# Patient Record
Sex: Male | Born: 1940 | Race: White | Hispanic: No | Marital: Married | Smoking: Former smoker
Health system: Southern US, Community
[De-identification: ages and names within clinical notes are randomized; demographics above are authoritative.]

## PROBLEM LIST (undated history)

## (undated) DIAGNOSIS — J189 Pneumonia, unspecified organism: Secondary | ICD-10-CM

## (undated) DIAGNOSIS — M1990 Unspecified osteoarthritis, unspecified site: Secondary | ICD-10-CM

## (undated) DIAGNOSIS — M545 Low back pain, unspecified: Secondary | ICD-10-CM

## (undated) DIAGNOSIS — I639 Cerebral infarction, unspecified: Secondary | ICD-10-CM

## (undated) DIAGNOSIS — R718 Other abnormality of red blood cells: Secondary | ICD-10-CM

## (undated) DIAGNOSIS — D751 Secondary polycythemia: Secondary | ICD-10-CM

## (undated) DIAGNOSIS — K219 Gastro-esophageal reflux disease without esophagitis: Secondary | ICD-10-CM

## (undated) DIAGNOSIS — U071 COVID-19: Secondary | ICD-10-CM

## (undated) DIAGNOSIS — K635 Polyp of colon: Secondary | ICD-10-CM

## (undated) HISTORY — DX: Gastro-esophageal reflux disease without esophagitis: K21.9

## (undated) HISTORY — DX: Other abnormality of red blood cells: R71.8

## (undated) HISTORY — DX: Secondary polycythemia: D75.1

## (undated) HISTORY — DX: Low back pain, unspecified: M54.50

## (undated) HISTORY — PX: COLONOSCOPY: SHX174

## (undated) HISTORY — PX: POLYPECTOMY: SHX149

## (undated) HISTORY — DX: Polyp of colon: K63.5

## (undated) HISTORY — DX: Unspecified osteoarthritis, unspecified site: M19.90

## (undated) HISTORY — DX: Low back pain: M54.5

## (undated) HISTORY — PX: HERNIA REPAIR: SHX51

## (undated) HISTORY — PX: APPENDECTOMY: SHX54

---

## 2005-05-24 ENCOUNTER — Ambulatory Visit: Payer: Self-pay | Admitting: Internal Medicine

## 2005-06-01 ENCOUNTER — Ambulatory Visit (HOSPITAL_COMMUNITY): Admission: RE | Admit: 2005-06-01 | Discharge: 2005-06-01 | Payer: Self-pay | Admitting: Internal Medicine

## 2005-07-05 ENCOUNTER — Ambulatory Visit: Payer: Self-pay | Admitting: Internal Medicine

## 2005-08-24 ENCOUNTER — Ambulatory Visit: Payer: Self-pay | Admitting: Internal Medicine

## 2007-04-17 ENCOUNTER — Ambulatory Visit: Payer: Self-pay | Admitting: Internal Medicine

## 2007-04-17 DIAGNOSIS — M79609 Pain in unspecified limb: Secondary | ICD-10-CM

## 2007-04-17 DIAGNOSIS — M545 Low back pain: Secondary | ICD-10-CM

## 2007-04-17 DIAGNOSIS — R209 Unspecified disturbances of skin sensation: Secondary | ICD-10-CM | POA: Insufficient documentation

## 2007-04-17 DIAGNOSIS — M1990 Unspecified osteoarthritis, unspecified site: Secondary | ICD-10-CM

## 2007-04-17 LAB — CONVERTED CEMR LAB
BUN: 16 mg/dL (ref 6–23)
Bilirubin Urine: NEGATIVE
CO2: 31 meq/L (ref 19–32)
Calcium: 10 mg/dL (ref 8.4–10.5)
Chloride: 101 meq/L (ref 96–112)
Creatinine, Ser: 1 mg/dL (ref 0.4–1.5)
GFR calc Af Amer: 96 mL/min
GFR calc non Af Amer: 80 mL/min
Glucose, Bld: 87 mg/dL (ref 70–99)
Hemoglobin, Urine: NEGATIVE
Ketones, ur: NEGATIVE mg/dL
Leukocytes, UA: NEGATIVE
Nitrite: NEGATIVE
Potassium: 4.8 meq/L (ref 3.5–5.1)
Sodium: 139 meq/L (ref 135–145)
Specific Gravity, Urine: 1.015 (ref 1.000–1.03)
TSH: 0.9 microintl units/mL (ref 0.35–5.50)
Total Protein, Urine: NEGATIVE mg/dL
Urine Glucose: NEGATIVE mg/dL
Urobilinogen, UA: 0.2 (ref 0.0–1.0)
Vitamin B-12: 358 pg/mL (ref 211–911)
pH: 6 (ref 5.0–8.0)

## 2007-04-20 LAB — CONVERTED CEMR LAB: Vit D, 1,25-Dihydroxy: 49 (ref 30–89)

## 2007-06-26 ENCOUNTER — Ambulatory Visit: Payer: Self-pay | Admitting: Internal Medicine

## 2007-06-27 ENCOUNTER — Ambulatory Visit: Payer: Self-pay | Admitting: Internal Medicine

## 2007-06-27 LAB — CONVERTED CEMR LAB
Calcium: 9.7 mg/dL (ref 8.4–10.5)
GFR calc Af Amer: 86 mL/min
GFR calc non Af Amer: 71 mL/min
Glucose, Bld: 132 mg/dL — ABNORMAL HIGH (ref 70–99)
Potassium: 4.2 meq/L (ref 3.5–5.1)
Sodium: 139 meq/L (ref 135–145)

## 2007-07-04 ENCOUNTER — Encounter: Admission: RE | Admit: 2007-07-04 | Discharge: 2007-07-04 | Payer: Self-pay | Admitting: Internal Medicine

## 2007-07-31 ENCOUNTER — Ambulatory Visit: Payer: Self-pay | Admitting: Internal Medicine

## 2009-03-07 DIAGNOSIS — R718 Other abnormality of red blood cells: Secondary | ICD-10-CM

## 2009-03-07 DIAGNOSIS — D751 Secondary polycythemia: Secondary | ICD-10-CM

## 2009-03-07 HISTORY — DX: Secondary polycythemia: D75.1

## 2009-03-07 HISTORY — DX: Other abnormality of red blood cells: R71.8

## 2009-07-01 ENCOUNTER — Ambulatory Visit: Payer: Self-pay | Admitting: Internal Medicine

## 2009-07-01 DIAGNOSIS — D751 Secondary polycythemia: Secondary | ICD-10-CM

## 2009-07-06 LAB — CONVERTED CEMR LAB
ALT: 30 units/L (ref 0–53)
AST: 32 units/L (ref 0–37)
BUN: 13 mg/dL (ref 6–23)
Basophils Relative: 0.7 % (ref 0.0–3.0)
Bilirubin, Direct: 0.1 mg/dL (ref 0.0–0.3)
Chloride: 102 meq/L (ref 96–112)
Direct LDL: 137.8 mg/dL
Eosinophils Relative: 5.1 % — ABNORMAL HIGH (ref 0.0–5.0)
GFR calc non Af Amer: 79.16 mL/min (ref >60)
HCT: 52 % (ref 39.0–52.0)
Hemoglobin, Urine: NEGATIVE
Leukocytes, UA: NEGATIVE
Lymphs Abs: 3.7 10*3/uL (ref 0.7–4.0)
Monocytes Relative: 10.8 % (ref 3.0–12.0)
Nitrite: NEGATIVE
Platelets: 228 10*3/uL (ref 150.0–400.0)
Potassium: 4.8 meq/L (ref 3.5–5.1)
RBC: 5.14 M/uL (ref 4.22–5.81)
Sodium: 140 meq/L (ref 135–145)
TSH: 0.87 microintl units/mL (ref 0.35–5.50)
Total Bilirubin: 1.1 mg/dL (ref 0.3–1.2)
Total CHOL/HDL Ratio: 6
Total Protein: 7.3 g/dL (ref 6.0–8.3)
Urobilinogen, UA: 1 (ref 0.0–1.0)
WBC: 9.9 10*3/uL (ref 4.5–10.5)

## 2009-07-30 ENCOUNTER — Ambulatory Visit: Payer: Self-pay | Admitting: Internal Medicine

## 2009-08-04 LAB — CONVERTED CEMR LAB
Basophils Absolute: 0 10*3/uL (ref 0.0–0.1)
Lymphocytes Relative: 38.4 % (ref 12.0–46.0)
Lymphs Abs: 3 10*3/uL (ref 0.7–4.0)
Monocytes Relative: 9.7 % (ref 3.0–12.0)
Neutrophils Relative %: 47 % (ref 43.0–77.0)
Platelets: 205 10*3/uL (ref 150.0–400.0)
RDW: 13.5 % (ref 11.5–14.6)

## 2009-08-11 ENCOUNTER — Ambulatory Visit: Payer: Self-pay | Admitting: Internal Medicine

## 2009-08-11 DIAGNOSIS — R059 Cough, unspecified: Secondary | ICD-10-CM | POA: Insufficient documentation

## 2009-08-11 DIAGNOSIS — R05 Cough: Secondary | ICD-10-CM | POA: Insufficient documentation

## 2009-11-30 ENCOUNTER — Ambulatory Visit: Payer: Self-pay | Admitting: Internal Medicine

## 2009-11-30 LAB — CONVERTED CEMR LAB
Albumin ELP: 61.3 % (ref 55.8–66.1)
Alpha-1-Globulin: 3.1 % (ref 2.9–4.9)
Alpha-2-Globulin: 10.9 % (ref 7.1–11.8)
Beta Globulin: 5.4 % (ref 4.7–7.2)
Total Protein, Serum Electrophoresis: 7.2 g/dL (ref 6.0–8.3)

## 2009-12-01 LAB — CONVERTED CEMR LAB
Basophils Relative: 0.6 % (ref 0.0–3.0)
CO2: 29 meq/L (ref 19–32)
Calcium: 9.2 mg/dL (ref 8.4–10.5)
Chloride: 102 meq/L (ref 96–112)
Eosinophils Absolute: 0.3 10*3/uL (ref 0.0–0.7)
Hemoglobin: 18 g/dL — ABNORMAL HIGH (ref 13.0–17.0)
Lymphocytes Relative: 36.9 % (ref 12.0–46.0)
MCHC: 35.1 g/dL (ref 30.0–36.0)
Neutro Abs: 4.8 10*3/uL (ref 1.4–7.7)
RBC: 4.99 M/uL (ref 4.22–5.81)
Sodium: 140 meq/L (ref 135–145)

## 2009-12-03 ENCOUNTER — Ambulatory Visit: Payer: Self-pay | Admitting: Internal Medicine

## 2010-03-02 ENCOUNTER — Ambulatory Visit: Payer: Self-pay | Admitting: Internal Medicine

## 2010-03-07 DIAGNOSIS — K219 Gastro-esophageal reflux disease without esophagitis: Secondary | ICD-10-CM

## 2010-03-07 HISTORY — DX: Gastro-esophageal reflux disease without esophagitis: K21.9

## 2010-03-10 ENCOUNTER — Other Ambulatory Visit: Payer: Self-pay | Admitting: Internal Medicine

## 2010-03-10 LAB — CBC WITH DIFFERENTIAL/PLATELET
Basophils Absolute: 0.1 10*3/uL (ref 0.0–0.1)
Basophils Relative: 0.7 % (ref 0.0–3.0)
Eosinophils Absolute: 0.4 10*3/uL (ref 0.0–0.7)
Eosinophils Relative: 4 % (ref 0.0–5.0)
HCT: 48.4 % (ref 39.0–52.0)
Hemoglobin: 17 g/dL (ref 13.0–17.0)
Lymphocytes Relative: 38.9 % (ref 12.0–46.0)
Lymphs Abs: 3.9 10*3/uL (ref 0.7–4.0)
MCHC: 35.1 g/dL (ref 30.0–36.0)
MCV: 101.1 fl — ABNORMAL HIGH (ref 78.0–100.0)
Monocytes Absolute: 1.1 10*3/uL — ABNORMAL HIGH (ref 0.1–1.0)
Monocytes Relative: 11.1 % (ref 3.0–12.0)
Neutro Abs: 4.5 10*3/uL (ref 1.4–7.7)
Neutrophils Relative %: 45.3 % (ref 43.0–77.0)
Platelets: 238 10*3/uL (ref 150.0–400.0)
RBC: 4.79 Mil/uL (ref 4.22–5.81)
RDW: 13 % (ref 11.5–14.6)
WBC: 9.9 10*3/uL (ref 4.5–10.5)

## 2010-03-10 LAB — BASIC METABOLIC PANEL
BUN: 17 mg/dL (ref 6–23)
CO2: 28 mEq/L (ref 19–32)
Calcium: 9.3 mg/dL (ref 8.4–10.5)
Chloride: 101 mEq/L (ref 96–112)
Creatinine, Ser: 1.1 mg/dL (ref 0.4–1.5)
GFR: 69.31 mL/min (ref >60.00)
Glucose, Bld: 109 mg/dL — ABNORMAL HIGH (ref 70–99)
Potassium: 4.2 mEq/L (ref 3.5–5.1)
Sodium: 137 mEq/L (ref 135–145)

## 2010-03-16 ENCOUNTER — Other Ambulatory Visit: Payer: Self-pay | Admitting: Internal Medicine

## 2010-03-16 ENCOUNTER — Ambulatory Visit
Admission: RE | Admit: 2010-03-16 | Discharge: 2010-03-16 | Payer: Self-pay | Source: Home / Self Care | Attending: Internal Medicine | Admitting: Internal Medicine

## 2010-03-16 DIAGNOSIS — K219 Gastro-esophageal reflux disease without esophagitis: Secondary | ICD-10-CM | POA: Insufficient documentation

## 2010-03-16 LAB — CBC WITH DIFFERENTIAL/PLATELET
Basophils Absolute: 0.1 10*3/uL (ref 0.0–0.1)
Basophils Relative: 0.6 % (ref 0.0–3.0)
Eosinophils Absolute: 0.5 10*3/uL (ref 0.0–0.7)
Eosinophils Relative: 5.9 % — ABNORMAL HIGH (ref 0.0–5.0)
HCT: 48 % (ref 39.0–52.0)
Hemoglobin: 16.5 g/dL (ref 13.0–17.0)
Lymphocytes Relative: 36.6 % (ref 12.0–46.0)
Lymphs Abs: 3.2 10*3/uL (ref 0.7–4.0)
MCHC: 34.3 g/dL (ref 30.0–36.0)
MCV: 101.6 fl — ABNORMAL HIGH (ref 78.0–100.0)
Monocytes Absolute: 0.9 10*3/uL (ref 0.1–1.0)
Monocytes Relative: 9.8 % (ref 3.0–12.0)
Neutro Abs: 4.1 10*3/uL (ref 1.4–7.7)
Neutrophils Relative %: 47.1 % (ref 43.0–77.0)
Platelets: 236 10*3/uL (ref 150.0–400.0)
RBC: 4.72 Mil/uL (ref 4.22–5.81)
RDW: 13.4 % (ref 11.5–14.6)
WBC: 8.8 10*3/uL (ref 4.5–10.5)

## 2010-03-16 LAB — FERRITIN: Ferritin: 23.6 ng/mL (ref 22.0–322.0)

## 2010-03-22 ENCOUNTER — Telehealth: Payer: Self-pay | Admitting: Internal Medicine

## 2010-03-26 ENCOUNTER — Ambulatory Visit
Admission: RE | Admit: 2010-03-26 | Discharge: 2010-03-26 | Payer: Self-pay | Source: Home / Self Care | Attending: Internal Medicine | Admitting: Internal Medicine

## 2010-03-26 ENCOUNTER — Other Ambulatory Visit: Payer: Self-pay | Admitting: Internal Medicine

## 2010-03-26 LAB — URINALYSIS
Bilirubin Urine: NEGATIVE
Hemoglobin, Urine: NEGATIVE
Ketones, ur: NEGATIVE
Leukocytes, UA: NEGATIVE
Nitrite: NEGATIVE
Specific Gravity, Urine: 1.015 (ref 1.000–1.030)
Total Protein, Urine: NEGATIVE
Urine Glucose: NEGATIVE
Urobilinogen, UA: 0.2 (ref 0.0–1.0)
pH: 6 (ref 5.0–8.0)

## 2010-03-31 ENCOUNTER — Encounter
Admission: RE | Admit: 2010-03-31 | Discharge: 2010-03-31 | Payer: Self-pay | Source: Home / Self Care | Attending: Internal Medicine | Admitting: Internal Medicine

## 2010-04-06 NOTE — Assessment & Plan Note (Signed)
Summary: 1 MO FU/DISCUSS LABS/KB   Vital Signs:  Patient profile:   70 year old male Height:      68 inches Weight:      196.38 pounds BMI:     29.97 O2 Sat:      93 % on Room air Temp:     98.2 degrees F oral Pulse rate:   70 / minute BP sitting:   134 / 74  (left arm) Cuff size:   large  Vitals Entered By: Lucious Groves (August 11, 2009 8:19 AM)  O2 Flow:  Room air CC: 1 mo f/u./kb Is Patient Diabetic? No Pain Assessment Patient in pain? no        CC:  1 mo f/u./kb.  History of Present Illness: F/u abn CBC C/o occasional cough, leg numpness  Current Medications (verified): 1)  Vitamin D 1000 Unit Tabs (Cholecalciferol) .[name].. 1 By Mouth Qd 2)  Ibuprofen 600 Mg Tabs (Ibuprofen) .[name].. 1 By Mouth Bid  Pc X 1 Wk Then As Needed For  Pain  Allergies (verified): No Known Drug Allergies  Past History:  Social History: Last updated: 04/17/2007 Retired Married Former Smoker  Past Medical History: Low back pain Osteoarthritis Polycythemia 2011 Elev MCV 2011  Physical Exam  General:  Well-developed,well-nourished,in no acute distress; alert,appropriate and cooperative throughout examination Eyes:  No corneal or conjunctival inflammation noted. EOMI. Perrla. Mouth:  Erythematous throat mucosa and intranasal erythema.  Lungs:  Normal respiratory effort, chest expands symmetrically. Lungs are clear to auscultation, no crackles or wheezes. Heart:  Normal rate and regular rhythm. S1 and S2 normal without gallop, murmur, click, rub or other extra sounds. Abdomen:  Bowel sounds positive,abdomen soft and non-tender without masses, organomegaly or hernias noted. Msk:  Lumbar-sacral spine is tender to palpation over paraspinal muscles and painfull with the ROM L hip is not  tender lat R poster knee is a little tender Pulses:  R and L carotid,radial,femoral,dorsalis pedis and posterior tibial pulses are full and equal bilaterally Extremities:  No edema B Neurologic:  No cranial  nerve deficits noted. Station and gait are normal. Plantar reflexes are down-going bilaterally. DTRs are symmetrical throughout. Sensory, motor and coordinative functions appear intact. Skin:  lipomas on abd wall Psych:  Cognition and judgment appear intact. Alert and cooperative with normal attention span and concentration. No apparent delusions, illusions, hallucinations   Impression & Recommendations:  Problem # 1:  POLYCYTHEMIA (ICD-289.0) Assessment Improved ASA Repeat labs in 3 months   Problem # 2:  LEG PAIN (ICD-729.5) R post knee OA Pennsaid  Problem # 3:  COUGH (ICD-786.2) Assessment: New  Orders: T-2 View CXR, Same Day (71020.5TC)  Problem # 4:  PARESTHESIA (ICD-782.0) Assessment: Unchanged  Complete Medication List: 1)  Vitamin D 1000 Unit Tabs (Cholecalciferol) .[name].. 1 by mouth qd 2)  Ibuprofen 600 Mg Tabs (Ibuprofen) .[name].. 1 by mouth bid  pc x 1 wk then as needed for  pain 3)  Pennsaid 1.5 % Soln (Diclofenac sodium) .... 3-5 gtt on skin three times a day for pain 4)  Aspirin 81 Mg Tbec (Aspirin) .[name].. 1 by mouth qd  Patient Instructions: 1)  Please schedule a follow-up appointment in 3 months. 2)  BMP prior to visit, ICD-9: 3)  CBC w/ Diff prior to visit, ICD-9: with Path periph smear Dx 289.0polycythemia 4)  Vit B12  782.0 5)  ESR 6)  SPEP Prescriptions: PENNSAID 1.5 % SOLN (DICLOFENAC SODIUM) 3-5 gtt on skin three times a day for pain  #  1 x 3   Entered and Authorized by:   Tresa Garter MD   Signed by:   Tresa Garter MD on 08/11/2009   Method used:   Print then Give to Patient   RxID:   [id number]

## 2010-04-06 NOTE — Assessment & Plan Note (Signed)
Summary: 3 mo rov /nws  #   Vital Signs:  Patient profile:   70 year old male Height:      68 inches Weight:      200 pounds BMI:     30.52 Temp:     97.1 degrees F oral Pulse rate:   72 / minute Pulse rhythm:   regular Resp:     16 per minute BP sitting:   140 / 80  (left arm) Cuff size:   regular  Vitals Entered By: Lanier Prude, CMA(AAMA) (December 03, 2009 8:21 AM) CC: 3 mo f/u Is Patient Diabetic? No   CC:  3 mo f/u.  History of Present Illness: F/u elev Hgb level  Preventive Screening-Counseling & Management  Alcohol-Tobacco     Smoking Status: quit  Current Medications (verified): 1)  Vitamin D 1000 Unit Tabs (Cholecalciferol) .[name].. 1 By Mouth Qd 2)  Ibuprofen 600 Mg Tabs (Ibuprofen) .[name].. 1 By Mouth Bid  Pc X 1 Wk Then As Needed For  Pain 3)  Pennsaid 1.5 % Soln (Diclofenac Sodium) .... 3-5 Gtt On Skin Three Times A Day For Pain 4)  Aspirin 81 Mg Tbec (Aspirin) .[name].. 1 By Mouth Qd  Allergies (verified): No Known Drug Allergies  Past History:  Past Medical History: Last updated: 08/11/2009 Low back pain Osteoarthritis Polycythemia 2011 Elev MCV 2011  Social History: Last updated: 04/17/2007 Retired Married Former Smoker  Review of Systems  The patient denies fever, chest pain, syncope, dyspnea on exertion, peripheral edema, prolonged cough, hemoptysis, abdominal pain, and headaches.    Physical Exam  General:  Well-developed,well-nourished,in no acute distress; alert,appropriate and cooperative throughout examination Eyes:  No corneal or conjunctival inflammation noted. EOMI. Perrla. Nose:  External nasal examination shows no deformity or inflammation. Nasal mucosa are pink and moist without lesions or exudates. Mouth:  Erythematous throat mucosa and intranasal erythema.  Lungs:  Normal respiratory effort, chest expands symmetrically. Lungs are clear to auscultation, no crackles or wheezes. Heart:  Normal rate and regular rhythm. S1 and S2  normal without gallop, murmur, click, rub or other extra sounds. Abdomen:  Bowel sounds positive,abdomen soft and non-tender without masses, organomegaly or hernias noted. Msk:  Lumbar-sacral spine is tender to palpation over paraspinal muscles and painfull with the ROM L hip is not  tender lat  knee is a not tender B Neurologic:  No cranial nerve deficits noted. Station and gait are normal. Plantar reflexes are down-going bilaterally. DTRs are symmetrical throughout. Sensory, motor and coordinative functions appear intact. Skin:  lipomas on abd wall plethoric face Psych:  Cognition and judgment appear intact. Alert and cooperative with normal attention span and concentration. No apparent delusions, illusions, hallucinations   Impression & Recommendations:  Problem # 1:  POLYCYTHEMIA (ICD-289.0) Assessment Deteriorated We discussed the issue The office visit took longer than 20 min with patient councelling for more than 50% of the 20 min   Orders: Phlebotomy,Therapuetic (16109) Hematology Referral (Hematology) Dr Myna Hidalgo - the pt refused Take ASA  Problem # 2:  LEG PAIN (ICD-729.5) - knee, resolved Assessment: Improved Pennsaid prn  Complete Medication List: 1)  Vitamin D 1000 Unit Tabs (Cholecalciferol) .[name].. 1 by mouth qd 2)  Ibuprofen 600 Mg Tabs (Ibuprofen) .[name].. 1 by mouth bid  pc x 1 wk then as needed for  pain 3)  Pennsaid 1.5 % Soln (Diclofenac sodium) .... 3-5 gtt on skin three times a day for pain 4)  Aspirin 81 Mg Tbec (Aspirin) .[name].. 1 by mouth  qd  Patient Instructions: 1)  Please schedule a follow-up appointment in 4 months. 2)  BMP prior to visit, ICD-9: 3)  CBC w/ Diff prior to visit, ICD-9:289.0

## 2010-04-06 NOTE — Assessment & Plan Note (Signed)
Summary: YEARLY FU/ MEDICARE/NWS  #   Vital Signs:  Patient profile:   70 year old male Height:      68 inches Weight:      201.25 pounds BMI:     30.71 O2 Sat:      94 % on Room air Temp:     97.3 degrees F oral Pulse rate:   77 / minute BP sitting:   130 / 82  (left arm)  Vitals Entered By: Lucious Groves (July 01, 2009 8:39 AM)  O2 Flow:  Room air  Procedure Note  Injections: The patient complains of pain and inflammation. Indication: chronic pain Consent signed: yes  Procedure # 1: joint injection    Region: lateral    Location: L hip    Technique: 24 g needle    Medication: 80 mg depomedrol    Anesthesia: 3.0 ml 1% lidocaine w/o epinephrine    Comment: Risks including but not limited by incomplete procedure, bleeding, infection, recurrence were discussed with the patient. Consent form was signed. Tolerated well. Complicatons - none. Good pain relief following the procedure.   Cleaned and prepped with: alcohol and betadine Wound dressing: bandaid   History of Present Illness: The patient presents for a wellness examination  Diet: Heart healthy and ADA  Physical activity - active.   Depression/mood screen: Negative.  Hearing: Intact  bilateral.  Visual Acuity: Grossly normal w/glasses.  ADL's: capable. Fall risk: None.  Home safety: good.  End of LifePlanning/Advanced directive - Full code. I agree. Discussed the need for AD. The patient presents with complaints of sore throat, fever, cough, sinus congestion and drainge of 3 wks duration. Not better with OTC meds. Chest hurts with coughing. Can't sleep due to cough. Muscle aches are present.  The mucus is colored.  C/o L hip pain x 2 years  Current Medications (verified): 1)  Otc Cold Medicine- Guaifensin 400mg  and Dextromethorphan 20 .... As Directed  Allergies (verified): No Known Drug Allergies  Past History:  Past Medical History: Last updated: 04/17/2007 Low back pain Osteoarthritis  Past Surgical  History: Last updated: 04/17/2007 Appendectomy  Family History: Last updated: 04/17/2007 Family History Breast cancer 1st degree relative  No CVA, DM  Social History: Last updated: 04/17/2007 Retired Married Former Smoker  Review of Systems  The patient denies anorexia, fever, weight loss, weight gain, vision loss, decreased hearing, hoarseness, chest pain, syncope, dyspnea on exertion, peripheral edema, prolonged cough, headaches, hemoptysis, abdominal pain, melena, hematochezia, severe indigestion/heartburn, hematuria, incontinence, genital sores, muscle weakness, suspicious skin lesions, transient blindness, difficulty walking, depression, unusual weight change, abnormal bleeding, enlarged lymph nodes, angioedema, and testicular masses.         ST  Physical Exam  General:  Well-developed,well-nourished,in no acute distress; alert,appropriate and cooperative throughout examination Eyes:  No corneal or conjunctival inflammation noted. EOMI. Perrla. Ears:  External ear exam shows no significant lesions or deformities.  Otoscopic examination reveals clear canals, tympanic membranes are intact bilaterally without bulging, retraction, inflammation or discharge. Hearing is grossly normal bilaterally. Mouth:  Erythematous throat mucosa and intranasal erythema.  Neck:  No deformities, masses, or tenderness noted. Chest Wall:  No deformities, masses, tenderness or gynecomastia noted. Lungs:  Normal respiratory effort, chest expands symmetrically. Lungs are clear to auscultation, no crackles or wheezes. Heart:  Normal rate and regular rhythm. S1 and S2 normal without gallop, murmur, click, rub or other extra sounds. Abdomen:  Bowel sounds positive,abdomen soft and non-tender without masses, organomegaly or hernias noted. Rectal:  WNL Genitalia:  Testes bilaterally descended without nodularity, tenderness or masses. No scrotal masses or lesions. No penis lesions or urethral  discharge. Prostate:  1+ enlarged.   Msk:  Lumbar-sacral spine is tender to palpation over paraspinal muscles and painfull with the ROM L hip is tender lat Pulses:  R and L carotid,radial,femoral,dorsalis pedis and posterior tibial pulses are full and equal bilaterally Extremities:  No edema B Neurologic:  No cranial nerve deficits noted. Station and gait are normal. Plantar reflexes are down-going bilaterally. DTRs are symmetrical throughout. Sensory, motor and coordinative functions appear intact. Skin:  lipomas on abd wall Cervical Nodes:  No lymphadenopathy noted Inguinal Nodes:  No significant adenopathy Psych:  Cognition and judgment appear intact. Alert and cooperative with normal attention span and concentration. No apparent delusions, illusions, hallucinations   Impression & Recommendations:  Problem # 1:  PHYSICAL EXAMINATION (ICD-V70.0) Assessment New Health and age related issues were discussed. Available screening tests and vaccinations were discussed as well. Healthy life style including good diet and execise was discussed.  Orders: EKG w/ Interpretation (93000) TLB-BMP (Basic Metabolic Panel-BMET) (80048-METABOL) TLB-CBC Platelet - w/Differential (85025-CBCD) TLB-Hepatic/Liver Function Pnl (80076-HEPATIC) TLB-Lipid Panel (80061-LIPID) TLB-TSH (Thyroid Stimulating Hormone) (84443-TSH) TLB-PSA (Prostate Specific Antigen) (84153-PSA) TLB-Udip ONLY (81003-UDIP)  Problem # 2:  SINUSITIS, ACUTE (ICD-461.9) Assessment: New  His updated medication list for this problem includes:    Zithromax Z-pak 250 Mg Tabs (Azithromycin) .[name]... As dirrected  Problem # 3:  LEG PAIN (ICD-729.5) L troch bursitis Assessment: New  Injection  Orders: Joint Aspirate / Injection, Large (20610) Depo- Medrol 80mg  (J1040)  Problem # 4:  LOW BACK PAIN (ICD-724.2) Assessment: Improved  The following medications were removed from the medication list:    Tramadol Hcl 50 Mg Tabs (Tramadol hcl)  .[name]... 1 or 2 tabs by mouth two times a day as needed pain His updated medication list for this problem includes:    Ibuprofen 600 Mg Tabs (Ibuprofen) .[name]... 1 by mouth bid  pc x 1 wk then as needed for  pain  Problem # 5:  POLYCYTHEMIA (ICD-289.0) Assessment: New RTC 1 month w/CBC  Complete Medication List: 1)  Vitamin D 1000 Unit Tabs (Cholecalciferol) .[name].. 1 by mouth qd 2)  Zithromax Z-pak 250 Mg Tabs (Azithromycin) .... As dirrected 3)  Ibuprofen 600 Mg Tabs (Ibuprofen) .[name].. 1 by mouth bid  pc x 1 wk then as needed for  pain  Other Orders: TD Toxoids IM 7 YR + (16109) Pneumococcal Vaccine (60454) Admin 1st Vaccine (09811) Admin of Any Addtl Vaccine (91478) Admin 1st Vaccine (State) [phone]) Admin of Any Addtl Vaccine (State) (30865H)  Patient Instructions: 1)  Please schedule a follow-up appointment in 1 month w/CBC. Prescriptions: IBUPROFEN 600 MG TABS (IBUPROFEN) 1 by mouth bid  pc x 1 wk then as needed for  pain  #60 x 3   Entered and Authorized by:   Tresa Garter MD   Signed by:   Tresa Garter MD on 07/01/2009   Method used:   Print then Give to Patient   RxID:   [id number] Z-PAK 250 MG TABS (AZITHROMYCIN) as dirrected  #1 x 0   Entered and Authorized by:   Tresa Garter MD   Signed by:   Tresa Garter MD on 07/01/2009   Method used:   Print then Give to Patient   RxID:   [id number]    Tetanus/Td Vaccine    Vaccine Type: Td    Site: right deltoid  Mfr: Sanofi Pasteur    Dose: 0.5 ml    Route: IM    Given by: Lucious Groves    Exp. Date: 03/20/2011    Lot #: [id number]    VIS given: 01/23/07 version given July 01, 2009.  Pneumovax Vaccine    Vaccine Type: Pneumovax    Site: left deltoid    Mfr: Merck    Dose: 0.5 ml    Route: IM    Given by: Lucious Groves    Exp. Date: 10/17/2010    Lot #: [id number]    VIS given: 10/03/95 version given July 01, 2009.

## 2010-04-08 NOTE — Assessment & Plan Note (Signed)
Summary: 3 MO ROV /NWS NWS  #   Vital Signs:  Patient profile:   70 year old male Height:      68 inches Weight:      202 pounds BMI:     30.83 Temp:     98.4 degrees F oral Pulse rate:   76 / minute Pulse rhythm:   regular Resp:     16 per minute BP sitting:   130 / 90  (left arm) Cuff size:   regular  Vitals Entered By: Lanier Prude, CMA(AAMA) (March 16, 2010 7:55 AM) CC: 3 mo f/u  Is Patient Diabetic? No Comments pt is not taking Vit D, Pennsaid or Ibuprofen   CC:  3 mo f/u .  History of Present Illness: C/o abd burning from Ibuprofen - he stopped.  The patient presents for a follow up of OA, LBP, polycythemia  Current Medications (verified): 1)  Vitamin D 1000 Unit Tabs (Cholecalciferol) .[name].. 1 By Mouth Qd 2)  Ibuprofen 600 Mg Tabs (Ibuprofen) .[name].. 1 By Mouth Bid  Pc X 1 Wk Then As Needed For  Pain 3)  Pennsaid 1.5 % Soln (Diclofenac Sodium) .... 3-5 Gtt On Skin Three Times A Day For Pain 4)  Aspirin 81 Mg Tbec (Aspirin) .[name].. 1 By Mouth Qd  Allergies (verified): No Known Drug Allergies  Past History:  Social History: Last updated: 04/17/2007 Retired Married Former Smoker  Past Medical History: Low back pain Osteoarthritis Polycythemia 2011 Elev MCV 2011 GERD 2012  Review of Systems  The patient denies fever, chest pain, dyspnea on exertion, abdominal pain, melena, and depression.         GERD  Physical Exam  General:  Well-developed,well-nourished,in no acute distress; alert,appropriate and cooperative throughout examination Eyes:  No corneal or conjunctival inflammation noted. EOMI. Perrla. Nose:  External nasal examination shows no deformity or inflammation. Nasal mucosa are pink and moist without lesions or exudates. Mouth:  Erythematous throat mucosa and intranasal erythema.  Lungs:  Normal respiratory effort, chest expands symmetrically. Lungs are clear to auscultation, no crackles or wheezes. Heart:  Normal rate and regular rhythm. S1 and  S2 normal without gallop, murmur, click, rub or other extra sounds. Abdomen:  Bowel sounds positive,abdomen soft and non-tender without masses, organomegaly or hernias noted. Msk:  Lumbar-sacral spine is tender to palpation over paraspinal muscles and painfull with the ROM L hip is not  tender lat  knee is a not tender B Extremities:  No edema B Neurologic:  No cranial nerve deficits noted. Station and gait are normal. Plantar reflexes are down-going bilaterally. DTRs are symmetrical throughout. Sensory, motor and coordinative functions appear intact. Skin:  lipomas on abd wall plethoric face Psych:  Cognition and judgment appear intact. Alert and cooperative with normal attention span and concentration. No apparent delusions, illusions, hallucinations   Impression & Recommendations:  Problem # 1:  POLYCYTHEMIA (ICD-289.0) Assessment Unchanged He declined further w/up incl. hematology consult Orders: Phlebotomy,Therapuetic (96295) TLB-CBC Platelet - w/Differential (85025-CBCD) TLB-Ferritin (82728-FER) Cont ASA  Problem # 2:  GERD (ICD-530.81) Assessment: New  D/c Ibuprofen  His updated medication list for this problem includes:    Ranitidine Hcl 300 Mg Tabs (Ranitidine hcl) .[name]... 1 by mouth once daily for indigestion  Problem # 3:  OSTEOARTHRITIS (ICD-715.90) Assessment: Unchanged  His updated medication list for this problem includes:    Ibuprofen 600 Mg Tabs (Ibuprofen) .[name]... 1 by mouth bid  pc x 1 wk then as needed for  pain  Aspirin 81 Mg Tbec (Aspirin) .[name]... 1 by mouth qd  Complete Medication List: 1)  Vitamin D 1000 Unit Tabs (Cholecalciferol) .[name].. 1 by mouth qd 2)  Ibuprofen 600 Mg Tabs (Ibuprofen) .[name].. 1 by mouth bid  pc x 1 wk then as needed for  pain 3)  Pennsaid 1.5 % Soln (Diclofenac sodium) .... 3-5 gtt on skin three times a day for pain 4)  Aspirin 81 Mg Tbec (Aspirin) .[name].. 1 by mouth qd 5)  Ranitidine Hcl 300 Mg Tabs (Ranitidine hcl) .[name].. 1 by mouth once  daily for indigestion  Patient Instructions: 1)  Please schedule a follow-up appointment in 2 months. Prescriptions: RANITIDINE HCL 300 MG TABS (RANITIDINE HCL) 1 by mouth once daily for indigestion  #30 x 3   Entered and Authorized by:   Tresa Garter MD   Signed by:   Tresa Garter MD on 03/16/2010   Method used:   Print then Give to Patient   RxID:   [id number]    Orders Added: 1)  Phlebotomy,Therapuetic [99195] 2)  Est. Patient Level IV [56213] 3)  TLB-CBC Platelet - w/Differential [85025-CBCD] 4)  TLB-Ferritin [08657-QIO]

## 2010-04-08 NOTE — Progress Notes (Signed)
Summary: NEEDS REFERRAL  Phone Note Call from Patient   Summary of Call: Patient is requesting referral to GI for colonscopy. Does not know when his last one was but did have w/[facility]. Please put in referral for colon, thanks Initial call taken by: Lamar Sprinkles, CMA,  March 22, 2010 11:38 AM  Follow-up for Phone Call        ok Follow-up by: Tresa Garter MD,  March 23, 2010 10:10 PM

## 2010-04-08 NOTE — Assessment & Plan Note (Signed)
Summary: abd discomfort/SD   Vital Signs:  Patient profile:   70 year old male Height:      68 inches Weight:      202 pounds BMI:     30.83 Temp:     98.7 degrees F oral Pulse rate:   88 / minute Pulse rhythm:   regular Resp:     16 per minute BP sitting:   150 / 96  (left arm) Cuff size:   regular  Vitals Entered By: Lanier Prude, CMA(AAMA) (March 26, 2010 4:42 PM) CC: Lt side LBP and LLQ Abd pain Is Patient Diabetic? No Comments pt is not taking Ibuprofen or Pennsaid   CC:  Lt side LBP and LLQ Abd pain.  History of Present Illness: C/o nagging pain in LLQ x 1 wk, continious and w/o irrad. No worse after meals. No fever. No N/V.  Current Medications (verified): 1)  Vitamin D 1000 Unit Tabs (Cholecalciferol) .[name].. 1 By Mouth Qd 2)  Ibuprofen 600 Mg Tabs (Ibuprofen) .[name].. 1 By Mouth Bid  Pc X 1 Wk Then As Needed For  Pain 3)  Pennsaid 1.5 % Soln (Diclofenac Sodium) .... 3-5 Gtt On Skin Three Times A Day For Pain 4)  Aspirin 81 Mg Tbec (Aspirin) .[name].. 1 By Mouth Qd 5)  Ranitidine Hcl 300 Mg Tabs (Ranitidine Hcl) .[name].. 1 By Mouth Once Daily For Indigestion  Allergies (verified): No Known Drug Allergies  Past History:  Past Medical History: Last updated: 03/16/2010 Low back pain Osteoarthritis Polycythemia 2011 Elev MCV 2011 GERD 2012  Past Surgical History: Last updated: 04/17/2007 Appendectomy  Social History: Last updated: 04/17/2007 Retired Married Former Smoker  Review of Systems       The patient complains of abdominal pain.  The patient denies anorexia, fever, weight loss, dyspnea on exertion, melena, hematochezia, severe indigestion/heartburn, hematuria, and difficulty walking.    Physical Exam  General:  Well-developed,well-nourished,in no acute distress; alert,appropriate and cooperative throughout examination Mouth:  pharynx pink and moist.   Lungs:  Normal respiratory effort, chest expands symmetrically. Lungs are clear to auscultation, no  crackles or wheezes. Heart:  Normal rate and regular rhythm. S1 and S2 normal without gallop, murmur, click, rub or other extra sounds. Abdomen:  Bowel sounds positive,abdomen soft Sensitive to palpation in LUQ Rectal:  no external abnormalities, no hemorrhoids, and no masses.  G(-) Prostate:  no nodules, no asymmetry, and 1+ enlarged.   Msk:  Lumbar-sacral spine is not  tender to palpation over paraspinal muscles    Impression & Recommendations:  Problem # 1:  ABDOMINAL PAIN, LEFT LOWER QUADRANT (ICD-789.04)/LUQ Assessment New R/o infection, splenomegaly etc Empiric Cipro See "Patient Instructions".  Orders: TLB-Udip ONLY (81003-UDIP) Radiology Referral (Radiology) CT  Problem # 2:  POLYCYTHEMIA (ICD-289.0) Assessment: Comment Only  Complete Medication List: 1)  Vitamin D 1000 Unit Tabs (Cholecalciferol) .[name].. 1 by mouth qd 2)  Ibuprofen 600 Mg Tabs (Ibuprofen) .[name].. 1 by mouth bid  pc x 1 wk then as needed for  pain 3)  Pennsaid 1.5 % Soln (Diclofenac sodium) .... 3-5 gtt on skin three times a day for pain 4)  Aspirin 81 Mg Tbec (Aspirin) .[name].. 1 by mouth qd 5)  Ranitidine Hcl 300 Mg Tabs (Ranitidine hcl) .[name].. 1 by mouth once daily for indigestion 6)  Ciprofloxacin Hcl 500 Mg Tabs (Ciprofloxacin hcl) .[name].. 1 by mouth bid  Patient Instructions: 1)  Please schedule a follow-up appointment in 2 weeks. 2)  Call if you are not better in  a reasonable amount of time or if worse. Go to ER if feeling really bad!  Prescriptions: CIPROFLOXACIN HCL 500 MG TABS (CIPROFLOXACIN HCL) 1 by mouth bid  #20 x 1   Entered and Authorized by:   Tresa Garter MD   Signed by:   Tresa Garter MD on 03/26/2010   Method used:   Print then Give to Patient   RxID:   [id number]    Orders Added: 1)  TLB-Udip ONLY [81003-UDIP] 2)  Radiology Referral [Radiology] 3)  Est. Patient Level IV [56213]

## 2010-05-03 ENCOUNTER — Encounter (INDEPENDENT_AMBULATORY_CARE_PROVIDER_SITE_OTHER): Payer: Self-pay | Admitting: *Deleted

## 2010-05-13 NOTE — Letter (Signed)
Summary: Pre Visit Letter Revised  [facility]  [address] [place], [place] [address]   Phone: [phone]  Fax: [phone]        05/03/2010 MRN: [id number] Henry [name] [address] [place], [place]  [address]             Procedure Date:  06-07-10           Recall Colon--Dr. Leone Payor   Welcome to the Gastroenterology Division at [facility].    You are scheduled to see a nurse for your pre-procedure visit on 05-25-10 at 10:00a.m. on the 3rd floor at [facility], [address].  We ask that you try to arrive at our office 15 minutes prior to your appointment time to allow for check-in.  Please take a minute to review the attached form.  If you answer "Yes" to one or more of the questions on the first page, we ask that you call the person listed at your earliest opportunity.  If you answer "No" to all of the questions, please complete the rest of the form and bring it to your appointment.    Your nurse visit will consist of discussing your medical and surgical history, your immediate family medical history, and your medications.   If you are unable to list all of your medications on the form, please bring the medication bottles to your appointment and we will list them.  We will need to be aware of both prescribed and over the counter drugs.  We will need to know exact dosage information as well.    Please be prepared to read and sign documents such as consent forms, a financial agreement, and acknowledgement forms.  If necessary, and with your consent, a friend or relative is welcome to sit-in on the nurse visit with you.  Please bring your insurance card so that we may make a copy of it.  If your insurance requires a referral to see a specialist, please bring your referral form from your primary care physician.  No co-pay is required for this nurse visit.     If you cannot keep your appointment, please call [phone] to cancel or reschedule prior  to your appointment date.  This allows [place] the opportunity to schedule an appointment for another patient in need of care.    Thank you for choosing [facility] for your medical needs.  We appreciate the opportunity to care for you.  Please visit [place] at our website  to learn more about our practice.  Sincerely, The Gastroenterology Division

## 2010-05-25 ENCOUNTER — Encounter (INDEPENDENT_AMBULATORY_CARE_PROVIDER_SITE_OTHER): Payer: Medicare Other | Admitting: Internal Medicine

## 2010-05-25 NOTE — Patient Instructions (Signed)
Pt to pick up prep with in 5 days

## 2010-05-27 ENCOUNTER — Telehealth: Payer: Self-pay | Admitting: Internal Medicine

## 2010-05-27 MED ORDER — PEG-KCL-NACL-NASULF-NA ASC-C 100 G PO SOLR: 1.0000 | Freq: Once | ORAL | Status: AC

## 2010-05-27 NOTE — Telephone Encounter (Signed)
SPOKE WITH PT. LET HIM KNOW THAT I RESENT MOVIPREP RX.

## 2010-06-04 ENCOUNTER — Encounter: Payer: Self-pay | Admitting: Internal Medicine

## 2010-06-07 ENCOUNTER — Ambulatory Visit (AMBULATORY_SURGERY_CENTER): Payer: Medicare Other | Admitting: Internal Medicine

## 2010-06-07 ENCOUNTER — Encounter: Payer: Self-pay | Admitting: Internal Medicine

## 2010-06-07 DIAGNOSIS — Z860100 Personal history of colon polyps, unspecified: Secondary | ICD-10-CM | POA: Insufficient documentation

## 2010-06-07 DIAGNOSIS — K5730 Diverticulosis of large intestine without perforation or abscess without bleeding: Secondary | ICD-10-CM

## 2010-06-07 DIAGNOSIS — D126 Benign neoplasm of colon, unspecified: Secondary | ICD-10-CM

## 2010-06-07 DIAGNOSIS — D128 Benign neoplasm of rectum: Secondary | ICD-10-CM

## 2010-06-07 DIAGNOSIS — Z1211 Encounter for screening for malignant neoplasm of colon: Secondary | ICD-10-CM

## 2010-06-07 DIAGNOSIS — D129 Benign neoplasm of anus and anal canal: Secondary | ICD-10-CM

## 2010-06-07 DIAGNOSIS — K621 Rectal polyp: Secondary | ICD-10-CM

## 2010-06-07 NOTE — Progress Notes (Signed)
Dr. Leone Payor made aware of pt. Oxygen level dropping, physician spoke with pt. And spouse about pt. Having sleep apnea and pamphlet given to family.

## 2010-06-07 NOTE — Patient Instructions (Addendum)
Resume all medications today.

## 2010-06-08 ENCOUNTER — Telehealth: Payer: Self-pay | Admitting: *Deleted

## 2010-06-08 ENCOUNTER — Ambulatory Visit: Payer: Self-pay | Admitting: Internal Medicine

## 2010-06-08 NOTE — Telephone Encounter (Signed)

## 2010-06-11 ENCOUNTER — Encounter: Payer: Self-pay | Admitting: Internal Medicine

## 2010-06-11 NOTE — Progress Notes (Signed)
Quick Note:  5 year recall - 2017  1 adenoma 1 hyperplastic ______

## 2010-06-22 ENCOUNTER — Other Ambulatory Visit: Payer: Self-pay | Admitting: Internal Medicine

## 2011-05-11 ENCOUNTER — Encounter: Payer: Self-pay | Admitting: Internal Medicine

## 2011-05-11 ENCOUNTER — Ambulatory Visit (INDEPENDENT_AMBULATORY_CARE_PROVIDER_SITE_OTHER): Payer: Medicare Other | Admitting: Internal Medicine

## 2011-05-11 DIAGNOSIS — M545 Low back pain: Secondary | ICD-10-CM

## 2011-05-11 DIAGNOSIS — IMO0002 Reserved for concepts with insufficient information to code with codable children: Secondary | ICD-10-CM

## 2011-05-11 DIAGNOSIS — M5416 Radiculopathy, lumbar region: Secondary | ICD-10-CM

## 2011-05-11 DIAGNOSIS — M48061 Spinal stenosis, lumbar region without neurogenic claudication: Secondary | ICD-10-CM

## 2011-05-11 MED ORDER — PREDNISONE (PAK) 10 MG PO TABS: 10.0000 mg | ORAL_TABLET | ORAL | Status: AC

## 2011-05-11 NOTE — Patient Instructions (Signed)
It was good to see you today. Prednisone taper x 6 days for pain symptoms - Your prescription(s) have been submitted to your pharmacy. Please take as directed and contact our office if you believe you are having problem(s) with the medication(s). we'll make referral to MRI back and to Dr. Danielle Dess for your back and leg pain symptoms. Our office will contact you regarding appointment(s) once made.

## 2011-05-11 NOTE — Progress Notes (Signed)
Subjective:    Patient ID: Henry [name], male    DOB: 11-07-1940, 71 y.o.   MRN: [id number]  HPI complains of back pain Ongoing for years, progressively/gradually worse Pain initially worse in LLE, now BLE radiation of pain No falls, no weakness - climbing stairs "ok, but i avoid them if possible"  Past Medical History  Diagnosis Date  . 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 Osteoarthritis   . Polycythemia 2011  . Elevated MCV 2011  . GERD (gastroesophageal reflux disease) 2012     Review of Systems  Constitutional: Negative for fever, fatigue and unexpected weight change.  Respiratory: Negative for cough and shortness of breath.   Cardiovascular: Negative for chest pain and palpitations.  Musculoskeletal: Positive for back pain. Negative for joint swelling and arthralgias.       Objective:   Physical Exam BP 126/70  Pulse 71  Temp(Src) 98.6 F (37 C) (Oral)  Ht 5\' 7"  (1.702 m)  Wt 198 lb 9.6 oz (90.084 kg)  BMI 31.11 kg/m2  SpO2 97% Wt Readings from Last 3 Encounters:  05/11/11 198 lb 9.6 oz (90.084 kg)  06/07/10 202 lb (91.627 kg)  05/25/10 202 lb (91.627 kg)   Constitutional:  He appears well-developed and well-nourished. No distress.  Neck: Normal range of motion. Neck supple. No JVD present. No thyromegaly present.  Cardiovascular: Normal rate, regular rhythm and normal heart sounds.  No murmur heard. no BLE edema Pulmonary/Chest: Effort normal and breath sounds normal. No respiratory distress. no wheezes.  Musculoskeletal:Back: mildly limited range of motion of thoracic and lumbar spine. Non tender to palpation. Positive L>R straight leg raise. DTR's are symmetrically intact. Sensation intact in all dermatomes of the lower extremities.  4+/5strength to manual muscle testing L>R quad. patient is able to heel toe walk, but  with difficulty and ambulates with slightly antalgic gait. Skin: Skin is warm and dry.   No erythema or ulceration.  Psychiatric: he has a normal mood and affect. behavior is normal. Judgment and thought content normal.   Lab Results  Component Value Date   WBC 8.8 03/16/2010   HGB 16.5 03/16/2010   HCT 48.0 03/16/2010   PLT 236.0 03/16/2010   GLUCOSE 109* 03/10/2010   CHOL 231* 07/01/2009   TRIG 252.0* 07/01/2009   HDL 41.20 07/01/2009   LDLDIRECT 137.8 07/01/2009   ALT 30 07/01/2009   AST 32 07/01/2009   NA 137 03/10/2010   K 4.2 03/10/2010   CL 101 03/10/2010   CREATININE 1.1 03/10/2010   BUN 17 03/10/2010   CO2 28 03/10/2010   TSH 0.87 07/01/2009   PSA 1.11 07/01/2009   MRI 06/2007 L spine: Findings:  The patient has five non-rib bearing lumbar vertebra, last open disc levels denoted L5-S1.  Multifactorial central stenosis is appreciated the L4-L5 and L5-S1 levels.  Additionally, there is multifactorial narrowing of the left L4 foramen.  No definite encroachment on the exiting  nerve root.  The conus terminates at the T12 level which is unremarkable.    L1-2: Facet degenerative changes.    L2-3: Mild facet degenerative changes.    L3-4: Disc bulge with mild facet degenerative change bilaterally.    L4-5: Severe multifactorial central stenosis.  This is secondary to a disc bulge, facet osteoarthritis, and ligament flavum hypertrophy.  Additionally, there is severe lateral recess stenosis left greater than right.    L5-S1: Severe multifactorial central stenosis.  The L5 roots exit without encroachment.    IMPRESSION: L4-L5, L5-S1 severe multifactorial central stenosis.      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

## 2011-05-19 ENCOUNTER — Ambulatory Visit
Admission: RE | Admit: 2011-05-19 | Discharge: 2011-05-19 | Disposition: A | Discharge status: Home / Self Care | Payer: Medicare Other | Source: Ambulatory Visit | Attending: Internal Medicine | Admitting: Internal Medicine

## 2011-05-19 DIAGNOSIS — M48061 Spinal stenosis, lumbar region without neurogenic claudication: Secondary | ICD-10-CM

## 2011-05-19 DIAGNOSIS — M5416 Radiculopathy, lumbar region: Secondary | ICD-10-CM

## 2011-05-19 DIAGNOSIS — M545 Low back pain: Secondary | ICD-10-CM

## 2011-09-12 ENCOUNTER — Other Ambulatory Visit (INDEPENDENT_AMBULATORY_CARE_PROVIDER_SITE_OTHER): Payer: Medicare Other

## 2011-09-12 ENCOUNTER — Ambulatory Visit (INDEPENDENT_AMBULATORY_CARE_PROVIDER_SITE_OTHER): Payer: Medicare Other | Admitting: Internal Medicine

## 2011-09-12 ENCOUNTER — Encounter: Payer: Self-pay | Admitting: Internal Medicine

## 2011-09-12 VITALS — BP 160/76 | HR 76 | Temp 98.6°F | Resp 16 | Ht 68.0 in | Wt 188.0 lb

## 2011-09-12 DIAGNOSIS — E785 Hyperlipidemia, unspecified: Secondary | ICD-10-CM

## 2011-09-12 DIAGNOSIS — Z125 Encounter for screening for malignant neoplasm of prostate: Secondary | ICD-10-CM

## 2011-09-12 DIAGNOSIS — M545 Low back pain: Secondary | ICD-10-CM

## 2011-09-12 DIAGNOSIS — Z0000 Encounter for general adult medical examination without abnormal findings: Secondary | ICD-10-CM | POA: Insufficient documentation

## 2011-09-12 DIAGNOSIS — D126 Benign neoplasm of colon, unspecified: Secondary | ICD-10-CM

## 2011-09-12 DIAGNOSIS — Z136 Encounter for screening for cardiovascular disorders: Secondary | ICD-10-CM

## 2011-09-12 DIAGNOSIS — N320 Bladder-neck obstruction: Secondary | ICD-10-CM

## 2011-09-12 DIAGNOSIS — R209 Unspecified disturbances of skin sensation: Secondary | ICD-10-CM

## 2011-09-12 DIAGNOSIS — K635 Polyp of colon: Secondary | ICD-10-CM

## 2011-09-12 LAB — HEPATIC FUNCTION PANEL
AST: 21 U/L (ref 0–37)
Albumin: 4.1 g/dL (ref 3.5–5.2)
Alkaline Phosphatase: 60 U/L (ref 39–117)
Bilirubin, Direct: 0.1 mg/dL (ref 0.0–0.3)
Total Protein: 7 g/dL (ref 6.0–8.3)

## 2011-09-12 LAB — BASIC METABOLIC PANEL
BUN: 17 mg/dL (ref 6–23)
Calcium: 9.6 mg/dL (ref 8.4–10.5)
Creatinine, Ser: 1.1 mg/dL (ref 0.4–1.5)
GFR: 72.74 mL/min (ref >60.00)
Glucose, Bld: 121 mg/dL — ABNORMAL HIGH (ref 70–99)

## 2011-09-12 LAB — URINALYSIS
Bilirubin Urine: NEGATIVE
Hgb urine dipstick: NEGATIVE
Nitrite: NEGATIVE
Total Protein, Urine: NEGATIVE
Urine Glucose: 100
pH: 6.5 (ref 5.0–8.0)

## 2011-09-12 LAB — CBC WITH DIFFERENTIAL/PLATELET
Basophils Relative: 0.7 % (ref 0.0–3.0)
Eosinophils Absolute: 0.3 10*3/uL (ref 0.0–0.7)
Eosinophils Relative: 4.2 % (ref 0.0–5.0)
HCT: 50 % (ref 39.0–52.0)
Lymphs Abs: 2.2 10*3/uL (ref 0.7–4.0)
MCHC: 33.5 g/dL (ref 30.0–36.0)
MCV: 95.8 fl (ref 78.0–100.0)
Monocytes Absolute: 0.8 10*3/uL (ref 0.1–1.0)
Neutro Abs: 3.6 10*3/uL (ref 1.4–7.7)
Neutrophils Relative %: 51 % (ref 43.0–77.0)
RBC: 5.22 Mil/uL (ref 4.22–5.81)
WBC: 7 10*3/uL (ref 4.5–10.5)

## 2011-09-12 LAB — LDL CHOLESTEROL, DIRECT: Direct LDL: 122.1 mg/dL

## 2011-09-12 LAB — LIPID PANEL
Cholesterol: 239 mg/dL — ABNORMAL HIGH (ref 0–200)
VLDL: 59 mg/dL — ABNORMAL HIGH (ref 0.0–40.0)

## 2011-09-12 MED ORDER — VITAMIN D 1000 UNITS PO TABS: 1000.0000 [IU] | ORAL_TABLET | Freq: Every day | ORAL | Status: AC

## 2011-09-12 NOTE — Assessment & Plan Note (Addendum)
The patient is here for annual Medicare wellness examination and management of other chronic and acute problems.   The risk factors are reflected in the social history.  The roster of all physicians providing medical care to patient - is listed in the Snapshot section of the chart.  Activities of daily living:  The patient is 100% inedpendent in all ADLs: dressing, toileting, feeding as well as independent mobility  Home safety : The patient has smoke detectors in the home. They wear seatbelts. There is no violence in the home.   There is no risks for hepatitis, STDs or HIV. There is no   history of blood transfusion. They have no travel history to infectious disease endemic areas of the world.  The patient has  seen their dentist in the last 12 month. They have  seen their eye doctor in the last year. They deny  Any major hearing difficulty and have not had audiologic testing in the last year.  They do not  have excessive sun exposure. Discussed the need for sun protection: hats, long sleeves and use of sunscreen if there is significant sun exposure.   Diet: the importance of a healthy diet is discussed. They do have a reasonably healthy  diet.  The patient has a fairly regular exercise program of a mixed nature: walking, yard work, etc.The benefits of regular aerobic exercise were discussed.  Depression screen: there are no signs or vegative symptoms of depression- irritability, change in appetite, anhedonia, sadness/tearfullness.  Cognitive assessment: the patient manages all their financial and personal affairs and is actively engaged. They could relate day,date,year and events; recalled 3/3 objects at 3 minutes  The following portions of the patient's history were reviewed and updated as appropriate: allergies, current medications, past family history, past medical history,  past surgical history, past social history  and problem list.  Vision, hearing, body mass index were assessed and  reviewed.   During the course of the visit the patient was educated and counseled about appropriate screening and preventive services including : fall prevention , diabetes screening, nutrition counseling, colorectal cancer screening, and recommended immunizations.  Zostavax suggested  

## 2011-09-12 NOTE — Assessment & Plan Note (Signed)
Last colon 2012 Dr Leone Payor Due in 5 y

## 2011-09-12 NOTE — Assessment & Plan Note (Signed)
Using Glucosamine

## 2011-09-12 NOTE — Patient Instructions (Signed)
Please see your eye doctor Zostavax

## 2011-09-12 NOTE — Progress Notes (Signed)
Subjective:    Patient ID: Henry [name], male    DOB: 04-18-1940, 71 y.o.   MRN: [id number]  HPI  The patient is here for a wellness exam. The patient has been doing well overall without major physical or psychological issues going on lately.  He complains of back pain Ongoing for years, progressively/gradually worse   Past Medical History  Diagnosis Date  . 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 Osteoarthritis   . Polycythemia 2011  . Elevated MCV 2011  . GERD (gastroesophageal reflux disease) 2012     Review of Systems  Constitutional: Negative for fever, appetite change, fatigue and unexpected weight change.  HENT: Negative for nosebleeds, congestion, sore throat, sneezing, trouble swallowing and neck pain.   Eyes: Negative for itching and visual disturbance.  Respiratory: Negative for cough and shortness of breath.   Cardiovascular: Negative for chest pain, palpitations and leg swelling.  Gastrointestinal: Negative for nausea, diarrhea, blood in stool and abdominal distention.  Genitourinary: Negative for frequency and hematuria.  Musculoskeletal: Positive for back pain. Negative for joint swelling, arthralgias and gait problem.  Skin: Negative for rash.  Neurological: Negative for dizziness, tremors, speech difficulty and weakness.  Psychiatric/Behavioral: Negative for disturbed wake/sleep cycle, dysphoric mood and agitation. The patient is not nervous/anxious.        Objective:   Physical Exam  Constitutional: He is oriented to person, place, and time. He appears well-developed and well-nourished. No distress.  HENT:  Head: Normocephalic and atraumatic.  Right Ear: External ear normal.  Left Ear: External ear normal.  Nose: Nose normal.  Mouth/Throat: Oropharynx is clear and moist. No oropharyngeal exudate.  Eyes: Conjunctivae and EOM are normal. Pupils are equal, round, and reactive to light. Right eye  exhibits no discharge. Left eye exhibits no discharge. No scleral icterus.  Neck: Normal range of motion. Neck supple. No JVD present. No tracheal deviation present. No thyromegaly present.  Cardiovascular: Normal rate, regular rhythm, normal heart sounds and intact distal pulses.  Exam reveals no gallop and no friction rub.   No murmur heard. Pulmonary/Chest: Effort normal and breath sounds normal. No stridor. No respiratory distress. He has no wheezes. He has no rales. He exhibits no tenderness.  Abdominal: Soft. Bowel sounds are normal. He exhibits no distension and no mass. There is no tenderness. There is no rebound and no guarding.  Genitourinary: Rectum normal, prostate normal and penis normal. Guaiac negative stool. No penile tenderness.  Musculoskeletal: Normal range of motion. He exhibits no edema and no tenderness.       LS is tender w/ROM  Lymphadenopathy:    He has no cervical adenopathy.  Neurological: He is alert and oriented to person, place, and time. He has normal reflexes. No cranial nerve deficit. He exhibits normal muscle tone. Coordination normal.  Skin: Skin is warm and dry. No rash noted. He is not diaphoretic. No erythema. No pallor.  Psychiatric: He has a normal mood and affect. His behavior is normal. Judgment and thought content normal.   BP 160/76  Pulse 76  Temp 98.6 F (37 C) (Oral)  Resp 16  Ht 5\' 8"  (1.727 m)  Wt 188 lb (85.276 kg)  BMI 28.59 kg/m2 Wt Readings from Last 3 Encounters:  09/12/11 188 lb (85.276 kg)  05/11/11 198 lb 9.6 oz (90.084 kg)  06/07/10 202 lb (91.627 kg)   Constitutional:  He appears well-developed and well-nourished. No distress.  Neck: Normal range of motion. Neck supple. No JVD present. No thyromegaly present.  Cardiovascular: Normal rate, regular rhythm and normal heart sounds.  No murmur heard. no BLE edema Pulmonary/Chest: Effort normal and breath sounds normal. No respiratory distress. no wheezes.  Musculoskeletal:Back:  mildly limited range of motion of thoracic and lumbar spine. Non tender to palpation. Positive L>R straight leg raise. DTR's are symmetrically intact. Sensation intact in all dermatomes of the lower extremities.  4+/5strength to manual muscle testing L>R quad. patient is able to heel toe walk, but  with difficulty and ambulates with slightly antalgic gait. Skin: Skin is warm and dry.  No erythema or ulceration.  Psychiatric: he has a normal mood and affect. behavior is normal. Judgment and thought content normal.   Lab Results  Component Value Date   WBC 8.8 03/16/2010   HGB 16.5 03/16/2010   HCT 48.0 03/16/2010   PLT 236.0 03/16/2010   GLUCOSE 109* 03/10/2010   CHOL 231* 07/01/2009   TRIG 252.0* 07/01/2009   HDL 41.20 07/01/2009   LDLDIRECT 137.8 07/01/2009   ALT 30 07/01/2009   AST 32 07/01/2009   NA 137 03/10/2010   K 4.2 03/10/2010   CL 101 03/10/2010   CREATININE 1.1 03/10/2010   BUN 17 03/10/2010   CO2 28 03/10/2010   TSH 0.87 07/01/2009   PSA 1.11 07/01/2009   MRI 06/2007 L spine: Findings:  The patient has five non-rib bearing lumbar vertebra, last open disc levels denoted L5-S1.  Multifactorial central stenosis is appreciated the L4-L5 and L5-S1 levels.  Additionally, there is multifactorial narrowing of the left L4 foramen.  No definite encroachment on the exiting  nerve root.  The conus terminates at the T12 level which is unremarkable.    L1-2: Facet degenerative changes.    L2-3: Mild facet degenerative changes.    L3-4: Disc bulge with mild facet degenerative change bilaterally.    L4-5: Severe multifactorial central stenosis.  This is secondary to a disc bulge, facet osteoarthritis, and ligament flavum hypertrophy.  Additionally, there is severe lateral recess stenosis left greater than right.    L5-S1: Severe multifactorial central stenosis.  The L5 roots exit without encroachment.    IMPRESSION: L4-L5, L5-S1 severe multifactorial central stenosis.      Assessment & Plan:

## 2011-09-13 ENCOUNTER — Telehealth: Payer: Self-pay | Admitting: Internal Medicine

## 2011-09-13 NOTE — Telephone Encounter (Signed)
Henry [name], please, inform patient that all labs are normal except for elev glu Cut back on sweets please. Loose 5-10 lbs Thx

## 2011-09-14 NOTE — Telephone Encounter (Signed)
Pt informed

## 2013-03-07 HISTORY — PX: INGUINAL HERNIA REPAIR: SHX194

## 2014-01-10 ENCOUNTER — Other Ambulatory Visit (INDEPENDENT_AMBULATORY_CARE_PROVIDER_SITE_OTHER): Payer: Commercial Managed Care - HMO

## 2014-01-10 ENCOUNTER — Ambulatory Visit (INDEPENDENT_AMBULATORY_CARE_PROVIDER_SITE_OTHER): Payer: Commercial Managed Care - HMO | Admitting: Internal Medicine

## 2014-01-10 ENCOUNTER — Encounter: Payer: Self-pay | Admitting: Internal Medicine

## 2014-01-10 VITALS — BP 150/98 | HR 73 | Temp 98.1°F | Ht 68.0 in | Wt 190.0 lb

## 2014-01-10 DIAGNOSIS — D751 Secondary polycythemia: Secondary | ICD-10-CM

## 2014-01-10 DIAGNOSIS — N320 Bladder-neck obstruction: Secondary | ICD-10-CM

## 2014-01-10 DIAGNOSIS — E785 Hyperlipidemia, unspecified: Secondary | ICD-10-CM

## 2014-01-10 DIAGNOSIS — Z0000 Encounter for general adult medical examination without abnormal findings: Secondary | ICD-10-CM

## 2014-01-10 DIAGNOSIS — Z23 Encounter for immunization: Secondary | ICD-10-CM

## 2014-01-10 DIAGNOSIS — I10 Essential (primary) hypertension: Secondary | ICD-10-CM

## 2014-01-10 DIAGNOSIS — M79662 Pain in left lower leg: Secondary | ICD-10-CM

## 2014-01-10 DIAGNOSIS — M5440 Lumbago with sciatica, unspecified side: Secondary | ICD-10-CM

## 2014-01-10 DIAGNOSIS — M25552 Pain in left hip: Secondary | ICD-10-CM

## 2014-01-10 LAB — LIPID PANEL
Cholesterol: 248 mg/dL — ABNORMAL HIGH (ref 0–200)
HDL: 37.7 mg/dL — AB (ref >39.00)
NonHDL: 210.3
Total CHOL/HDL Ratio: 7
Triglycerides: 251 mg/dL — ABNORMAL HIGH (ref 0.0–149.0)
VLDL: 50.2 mg/dL — ABNORMAL HIGH (ref 0.0–40.0)

## 2014-01-10 LAB — HEPATIC FUNCTION PANEL
ALBUMIN: 3.7 g/dL (ref 3.5–5.2)
ALT: 19 U/L (ref 0–53)
AST: 19 U/L (ref 0–37)
Alkaline Phosphatase: 64 U/L (ref 39–117)
Bilirubin, Direct: 0.1 mg/dL (ref 0.0–0.3)
Total Bilirubin: 0.8 mg/dL (ref 0.2–1.2)
Total Protein: 7.1 g/dL (ref 6.0–8.3)

## 2014-01-10 LAB — URINALYSIS
BILIRUBIN URINE: NEGATIVE
HGB URINE DIPSTICK: NEGATIVE
Ketones, ur: NEGATIVE
LEUKOCYTES UA: NEGATIVE
Nitrite: NEGATIVE
Specific Gravity, Urine: 1.015 (ref 1.000–1.030)
TOTAL PROTEIN, URINE-UPE24: NEGATIVE
UROBILINOGEN UA: 0.2 (ref 0.0–1.0)
Urine Glucose: NEGATIVE
pH: 7 (ref 5.0–8.0)

## 2014-01-10 LAB — CBC WITH DIFFERENTIAL/PLATELET
Basophils Absolute: 0.1 10*3/uL (ref 0.0–0.1)
Basophils Relative: 0.7 % (ref 0.0–3.0)
EOS PCT: 3.4 % (ref 0.0–5.0)
Eosinophils Absolute: 0.3 10*3/uL (ref 0.0–0.7)
HEMATOCRIT: 53.2 % — AB (ref 39.0–52.0)
HEMOGLOBIN: 17.9 g/dL — AB (ref 13.0–17.0)
LYMPHS ABS: 3.2 10*3/uL (ref 0.7–4.0)
Lymphocytes Relative: 39.7 % (ref 12.0–46.0)
MCHC: 33.5 g/dL (ref 30.0–36.0)
MCV: 101 fl — ABNORMAL HIGH (ref 78.0–100.0)
MONO ABS: 0.9 10*3/uL (ref 0.1–1.0)
Monocytes Relative: 10.8 % (ref 3.0–12.0)
NEUTROS ABS: 3.7 10*3/uL (ref 1.4–7.7)
Neutrophils Relative %: 45.4 % (ref 43.0–77.0)
Platelets: 214 10*3/uL (ref 150.0–400.0)
RBC: 5.27 Mil/uL (ref 4.22–5.81)
RDW: 13.5 % (ref 11.5–15.5)
WBC: 8.1 10*3/uL (ref 4.0–10.5)

## 2014-01-10 LAB — BASIC METABOLIC PANEL
BUN: 17 mg/dL (ref 6–23)
CALCIUM: 9.6 mg/dL (ref 8.4–10.5)
CO2: 30 mEq/L (ref 19–32)
CREATININE: 1 mg/dL (ref 0.4–1.5)
Chloride: 102 mEq/L (ref 96–112)
GFR: 81.89 mL/min (ref >60.00)
GLUCOSE: 110 mg/dL — AB (ref 70–99)
Potassium: 4.4 mEq/L (ref 3.5–5.1)
SODIUM: 137 meq/L (ref 135–145)

## 2014-01-10 LAB — TSH: TSH: 0.93 u[IU]/mL (ref 0.35–4.50)

## 2014-01-10 LAB — LDL CHOLESTEROL, DIRECT: LDL DIRECT: 142.3 mg/dL

## 2014-01-10 LAB — PSA: PSA: 1.09 ng/mL (ref 0.10–4.00)

## 2014-01-10 MED ORDER — MELOXICAM 7.5 MG PO TABS: ORAL_TABLET | ORAL | Status: DC

## 2014-01-10 MED ORDER — TRAMADOL HCL 50 MG PO TABS: 50.0000 mg | ORAL_TABLET | Freq: Two times a day (BID) | ORAL | Status: DC | PRN

## 2014-01-10 MED ORDER — VITAMIN D 1000 UNITS PO TABS: 1000.0000 [IU] | ORAL_TABLET | Freq: Every day | ORAL | Status: AC

## 2014-01-10 MED ORDER — METHYLPREDNISOLONE ACETATE 80 MG/ML IJ SUSP
80.0000 mg | Freq: Once | INTRAMUSCULAR | Status: AC
  Administered 2014-01-10: 80 mg via INTRA_ARTICULAR

## 2014-01-10 NOTE — Progress Notes (Deleted)
Pre visit review using our clinic review tool, if applicable. No additional management support is needed unless otherwise documented below in the visit note. 

## 2014-01-10 NOTE — Patient Instructions (Addendum)

## 2014-01-10 NOTE — Assessment & Plan Note (Signed)
Here for medicare wellness/physical  Diet: heart healthy  Physical activity: not sedentary  Depression/mood screen: negative  Hearing: intact to whispered voice  Visual acuity: grossly normal, performs annual eye exam  ADLs: capable  Fall risk: none  Home safety: good  Cognitive evaluation: intact to orientation, naming, recall and repetition  EOL planning: adv directives, full code/ I agree  I have personally reviewed and have noted  1. The patient's medical and social history  2. Their use of alcohol, tobacco or illicit drugs  3. Their current medications and supplements  4. The patient's functional ability including ADL's, fall risks, home safety risks and hearing or visual impairment.  5. Diet and physical activities  6. Evidence for depression or mood disorders    Today patient counseled on age appropriate routine health concerns for screening and prevention, each reviewed and up to date or declined. Immunizations reviewed and up to date or declined. Labs ordered and reviewed. Risk factors for depression reviewed and negative. Hearing function and visual acuity are intact. ADLs screened and addressed as needed. Functional ability and level of safety reviewed and appropriate. Education, counseling and referrals performed based on assessed risks today. Patient provided with a copy of personalized plan for preventive services.       Zostavax suggested

## 2014-01-10 NOTE — Assessment & Plan Note (Signed)
See procedure 

## 2014-01-10 NOTE — Assessment & Plan Note (Signed)
Chronic  Declined Rx in the past Discussed

## 2014-01-10 NOTE — Assessment & Plan Note (Signed)
Chronic   MRI 2013: IMPRESSION: Persistent multifactorial spinal and lateral recess stenosis at L3- 4, L4-5 and L5-S1. Original Report Authenticated By: P. Kalman Jewels, M.D.  Discussed

## 2014-01-10 NOTE — Assessment & Plan Note (Signed)
L hip pain

## 2014-01-10 NOTE — Progress Notes (Signed)
Patient ID: Henry [name], male   DOB: 10-31-1940, 73 y.o.   MRN: [id number]   Subjective:    HPI  The patient is here for a wellness exam. The patient has been doing well overall without major physical or psychological issues going on lately.  He complains of less back pain Ongoing for years, progressively/gradually worse C/o L hip pain w/ROM - bad   Past Medical History  Diagnosis Date  . 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 Osteoarthritis   . Polycythemia 2011  . Elevated MCV 2011  . GERD (gastroesophageal reflux disease) 2012     Review of Systems  Constitutional: Negative for fever, appetite change, fatigue and unexpected weight change.  HENT: Negative for congestion, nosebleeds, sneezing, sore throat and trouble swallowing.   Eyes: Negative for itching and visual disturbance.  Respiratory: Negative for cough and shortness of breath.   Cardiovascular: Negative for chest pain, palpitations and leg swelling.  Gastrointestinal: Negative for nausea, diarrhea, blood in stool and abdominal distention.  Genitourinary: Negative for frequency and hematuria.  Musculoskeletal: Positive for back pain. Negative for joint swelling, arthralgias, gait problem and neck pain.  Skin: Negative for rash.  Neurological: Negative for dizziness, tremors, speech difficulty and weakness.  Psychiatric/Behavioral: Negative for sleep disturbance, dysphoric mood and agitation. The patient is not nervous/anxious.        Objective:   Physical Exam BP 150/98 mmHg  Pulse 73  Temp(Src) 98.1 F (36.7 C) (Oral)  Ht 5\' 8"  (1.727 m)  Wt 190 lb (86.183 kg)  BMI 28.90 kg/m2  SpO2 96% Wt Readings from Last 3 Encounters:  01/10/14 190 lb (86.183 kg)  09/12/11 188 lb (85.276 kg)  05/11/11 198 lb 9.6 oz (90.084 kg)   Constitutional:  He appears well-developed and well-nourished. No distress.  Neck: Normal range of motion. Neck supple. No JVD  present. No thyromegaly present.  Cardiovascular: Normal rate, regular rhythm and normal heart sounds.  No murmur heard. no BLE edema Pulmonary/Chest: Effort normal and breath sounds normal. No respiratory distress. no wheezes.  Musculoskeletal:Back: mildly limited range of motion of thoracic and lumbar spine. Non tender to palpation. Positive L>R straight leg raise. DTR's are symmetrically intact. Sensation intact in all dermatomes of the lower extremities.  4+/5strength to manual muscle testing L>R quad. patient is able to heel toe walk, but  with difficulty and ambulates with slightly antalgic gait. Skin: Skin is warm and dry.  No erythema or ulceration.  Psychiatric: he has a normal mood and affect. behavior is normal. Judgment and thought content normal.  Prostate 1+. Rect g(-) L hip is tender  Lab Results  Component Value Date   WBC 7.0 09/12/2011   HGB 16.8 09/12/2011   HCT 50.0 09/12/2011   PLT 186.0 09/12/2011   GLUCOSE 121* 09/12/2011   CHOL 239* 09/12/2011   TRIG 295.0* 09/12/2011   HDL 44.30 09/12/2011   LDLDIRECT 122.1 09/12/2011   ALT 21 09/12/2011   AST 21 09/12/2011   NA 137 09/12/2011   K 4.2 09/12/2011   CL 99 09/12/2011   CREATININE 1.1 09/12/2011   BUN 17 09/12/2011   CO2 30 09/12/2011   TSH 0.76 09/12/2011   PSA 1.89 09/12/2011    Procedure Note :     Procedure : Joint Injection,  L  hip   Indication:  Trochanteric bursitis with refractory  chronic pain.   Risks including unsuccessful procedure , bleeding,  infection, bruising, skin atrophy, "steroid flare-up" and others were explained to the patient in detail as well as the benefits. Informed consent was obtained and signed.   Tthe patient was placed in a comfortable lateral decubitus position. The point of maximal tenderness was identified. Skin was prepped with Betadine and alcohol. Then, a 5 cc syringe with a 2 inch long 24-gauge needle was used for a bursa injection.. The needle was advanced  Into the  bursa. I injected the bursa with 4 mL of 2% lidocaine and 40 mg of Depo-Medrol .  Band-Aid was applied.   Tolerated well. Complications: None. Good pain relief following the procedure.   Postprocedure instructions :    A Band-Aid should be left on for 12 hours. Injection therapy is not a cure itself. It is used in conjunction with other modalities. You can use nonsteroidal anti-inflammatories like ibuprofen , hot and cold compresses. Rest is recommended in the next 24 hours. You need to report immediately  if fever, chills or any signs of infection develop.        Assessment & Plan:

## 2014-01-10 NOTE — Assessment & Plan Note (Signed)
CBC Donor

## 2014-03-19 ENCOUNTER — Encounter: Payer: Self-pay | Admitting: Internal Medicine

## 2014-03-19 ENCOUNTER — Ambulatory Visit (INDEPENDENT_AMBULATORY_CARE_PROVIDER_SITE_OTHER): Payer: Commercial Managed Care - HMO | Admitting: Internal Medicine

## 2014-03-19 ENCOUNTER — Other Ambulatory Visit (INDEPENDENT_AMBULATORY_CARE_PROVIDER_SITE_OTHER): Payer: Commercial Managed Care - HMO

## 2014-03-19 VITALS — BP 150/94 | HR 71 | Temp 98.1°F | Wt 193.0 lb

## 2014-03-19 DIAGNOSIS — M79662 Pain in left lower leg: Secondary | ICD-10-CM

## 2014-03-19 DIAGNOSIS — D751 Secondary polycythemia: Secondary | ICD-10-CM

## 2014-03-19 DIAGNOSIS — M25552 Pain in left hip: Secondary | ICD-10-CM

## 2014-03-19 DIAGNOSIS — K4090 Unilateral inguinal hernia, without obstruction or gangrene, not specified as recurrent: Secondary | ICD-10-CM | POA: Insufficient documentation

## 2014-03-19 DIAGNOSIS — M5440 Lumbago with sciatica, unspecified side: Secondary | ICD-10-CM

## 2014-03-19 DIAGNOSIS — I10 Essential (primary) hypertension: Secondary | ICD-10-CM

## 2014-03-19 LAB — CBC WITH DIFFERENTIAL/PLATELET
Basophils Absolute: 0.1 10*3/uL (ref 0.0–0.1)
Basophils Relative: 0.6 % (ref 0.0–3.0)
Eosinophils Absolute: 0.3 10*3/uL (ref 0.0–0.7)
Eosinophils Relative: 3.2 % (ref 0.0–5.0)
HCT: 52.4 % — ABNORMAL HIGH (ref 39.0–52.0)
Hemoglobin: 17.5 g/dL — ABNORMAL HIGH (ref 13.0–17.0)
LYMPHS PCT: 40.5 % (ref 12.0–46.0)
Lymphs Abs: 3.8 10*3/uL (ref 0.7–4.0)
MCHC: 33.4 g/dL (ref 30.0–36.0)
MCV: 101.4 fl — ABNORMAL HIGH (ref 78.0–100.0)
MONOS PCT: 11.1 % (ref 3.0–12.0)
Monocytes Absolute: 1 10*3/uL (ref 0.1–1.0)
NEUTROS PCT: 44.6 % (ref 43.0–77.0)
Neutro Abs: 4.2 10*3/uL (ref 1.4–7.7)
PLATELETS: 271 10*3/uL (ref 150.0–400.0)
RBC: 5.17 Mil/uL (ref 4.22–5.81)
RDW: 13.5 % (ref 11.5–15.5)
WBC: 9.3 10*3/uL (ref 4.0–10.5)

## 2014-03-19 NOTE — Assessment & Plan Note (Signed)
CBC Start donating blood

## 2014-03-19 NOTE — Patient Instructions (Signed)

## 2014-03-19 NOTE — Assessment & Plan Note (Signed)
2013 MRI IMPRESSION: Persistent multifactorial spinal and lateral recess stenosis at L3- 4, L4-5 and L5-S1.  Original Report Authenticated By: P. Kalman Jewels, M.D.  L hip pain, RLE numbness - discussed

## 2014-03-19 NOTE — Assessment & Plan Note (Signed)
1/16 L side - orange size Surg ref was offered - declined

## 2014-03-19 NOTE — Progress Notes (Signed)
Pre visit review using our clinic review tool, if applicable. No additional management support is needed unless otherwise documented below in the visit note. 

## 2014-03-19 NOTE — Assessment & Plan Note (Signed)
BP Readings from Last 3 Encounters:  03/19/14 150/94  01/10/14 150/98  09/12/11 160/76  BP nl at home

## 2014-03-20 ENCOUNTER — Encounter: Payer: Self-pay | Admitting: Internal Medicine

## 2014-03-20 NOTE — Assessment & Plan Note (Signed)
Better  
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
## 2014-03-26 ENCOUNTER — Other Ambulatory Visit: Payer: Self-pay | Admitting: Internal Medicine

## 2014-03-26 DIAGNOSIS — K4021 Bilateral inguinal hernia, without obstruction or gangrene, recurrent: Secondary | ICD-10-CM

## 2014-04-07 ENCOUNTER — Ambulatory Visit (INDEPENDENT_AMBULATORY_CARE_PROVIDER_SITE_OTHER): Payer: Commercial Managed Care - HMO | Admitting: Internal Medicine

## 2014-04-07 ENCOUNTER — Encounter: Payer: Self-pay | Admitting: Internal Medicine

## 2014-04-07 VITALS — BP 128/84 | HR 102 | Temp 97.9°F | Wt 193.0 lb

## 2014-04-07 DIAGNOSIS — I10 Essential (primary) hypertension: Secondary | ICD-10-CM | POA: Diagnosis not present

## 2014-04-07 DIAGNOSIS — K4091 Unilateral inguinal hernia, without obstruction or gangrene, recurrent: Secondary | ICD-10-CM

## 2014-04-07 NOTE — Assessment & Plan Note (Signed)
BP Readings from Last 3 Encounters:  04/07/14 128/84  03/19/14 150/94  01/10/14 150/98

## 2014-04-07 NOTE — Progress Notes (Signed)
Pre visit review using our clinic review tool, if applicable. No additional management support is needed unless otherwise documented below in the visit note. 
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
## 2014-04-07 NOTE — Assessment & Plan Note (Signed)
1/16 L side - "orange" size, no sx's Surgical ref

## 2014-04-08 ENCOUNTER — Telehealth: Payer: Self-pay | Admitting: Internal Medicine

## 2014-04-08 NOTE — Telephone Encounter (Signed)
emmi mailed  °

## 2014-04-09 DIAGNOSIS — K4090 Unilateral inguinal hernia, without obstruction or gangrene, not specified as recurrent: Secondary | ICD-10-CM | POA: Diagnosis not present

## 2014-04-09 DIAGNOSIS — R351 Nocturia: Secondary | ICD-10-CM | POA: Diagnosis not present

## 2014-04-09 DIAGNOSIS — D751 Secondary polycythemia: Secondary | ICD-10-CM | POA: Diagnosis not present

## 2014-04-16 DIAGNOSIS — K4090 Unilateral inguinal hernia, without obstruction or gangrene, not specified as recurrent: Secondary | ICD-10-CM | POA: Diagnosis not present

## 2014-04-21 DIAGNOSIS — K4090 Unilateral inguinal hernia, without obstruction or gangrene, not specified as recurrent: Secondary | ICD-10-CM | POA: Diagnosis not present

## 2014-06-17 ENCOUNTER — Ambulatory Visit: Payer: Commercial Managed Care - HMO | Admitting: Internal Medicine

## 2015-04-14 ENCOUNTER — Encounter: Payer: Self-pay | Admitting: Internal Medicine

## 2015-04-14 ENCOUNTER — Other Ambulatory Visit (INDEPENDENT_AMBULATORY_CARE_PROVIDER_SITE_OTHER): Payer: Commercial Managed Care - HMO

## 2015-04-14 ENCOUNTER — Telehealth: Payer: Self-pay | Admitting: Emergency Medicine

## 2015-04-14 ENCOUNTER — Ambulatory Visit (INDEPENDENT_AMBULATORY_CARE_PROVIDER_SITE_OTHER): Payer: Commercial Managed Care - HMO | Admitting: Internal Medicine

## 2015-04-14 VITALS — BP 150/90 | HR 76 | Ht 68.0 in | Wt 198.0 lb

## 2015-04-14 DIAGNOSIS — M150 Primary generalized (osteo)arthritis: Secondary | ICD-10-CM

## 2015-04-14 DIAGNOSIS — D751 Secondary polycythemia: Secondary | ICD-10-CM

## 2015-04-14 DIAGNOSIS — R209 Unspecified disturbances of skin sensation: Secondary | ICD-10-CM

## 2015-04-14 DIAGNOSIS — N320 Bladder-neck obstruction: Secondary | ICD-10-CM | POA: Diagnosis not present

## 2015-04-14 DIAGNOSIS — I10 Essential (primary) hypertension: Secondary | ICD-10-CM

## 2015-04-14 DIAGNOSIS — K635 Polyp of colon: Secondary | ICD-10-CM | POA: Diagnosis not present

## 2015-04-14 DIAGNOSIS — Z0000 Encounter for general adult medical examination without abnormal findings: Secondary | ICD-10-CM

## 2015-04-14 DIAGNOSIS — M159 Polyosteoarthritis, unspecified: Secondary | ICD-10-CM

## 2015-04-14 DIAGNOSIS — R7989 Other specified abnormal findings of blood chemistry: Secondary | ICD-10-CM

## 2015-04-14 DIAGNOSIS — Z0001 Encounter for general adult medical examination with abnormal findings: Secondary | ICD-10-CM | POA: Diagnosis not present

## 2015-04-14 LAB — LIPID PANEL
CHOLESTEROL: 225 mg/dL — AB (ref 0–200)
HDL: 41.8 mg/dL (ref >39.00)
NonHDL: 183.2
TRIGLYCERIDES: 278 mg/dL — AB (ref 0.0–149.0)
Total CHOL/HDL Ratio: 5
VLDL: 55.6 mg/dL — ABNORMAL HIGH (ref 0.0–40.0)

## 2015-04-14 LAB — URINALYSIS
BILIRUBIN URINE: NEGATIVE
HGB URINE DIPSTICK: NEGATIVE
KETONES UR: NEGATIVE
LEUKOCYTES UA: NEGATIVE
Nitrite: NEGATIVE
Specific Gravity, Urine: 1.02 (ref 1.000–1.030)
Total Protein, Urine: NEGATIVE
URINE GLUCOSE: NEGATIVE
UROBILINOGEN UA: 0.2 (ref 0.0–1.0)
pH: 6.5 (ref 5.0–8.0)

## 2015-04-14 LAB — PSA: PSA: 1.11 ng/mL (ref 0.10–4.00)

## 2015-04-14 LAB — CBC WITH DIFFERENTIAL/PLATELET
BASOS PCT: 0.7 % (ref 0.0–3.0)
Basophils Absolute: 0.1 10*3/uL (ref 0.0–0.1)
EOS ABS: 0.5 10*3/uL (ref 0.0–0.7)
EOS PCT: 5.3 % — AB (ref 0.0–5.0)
HEMATOCRIT: 53.6 % — AB (ref 39.0–52.0)
HEMOGLOBIN: 18.3 g/dL — AB (ref 13.0–17.0)
Lymphocytes Relative: 37.5 % (ref 12.0–46.0)
Lymphs Abs: 3.6 10*3/uL (ref 0.7–4.0)
MCHC: 34.1 g/dL (ref 30.0–36.0)
MCV: 98.8 fl (ref 78.0–100.0)
MONO ABS: 1 10*3/uL (ref 0.1–1.0)
Monocytes Relative: 10.4 % (ref 3.0–12.0)
NEUTROS ABS: 4.4 10*3/uL (ref 1.4–7.7)
Neutrophils Relative %: 46.1 % (ref 43.0–77.0)
PLATELETS: 214 10*3/uL (ref 150.0–400.0)
RBC: 5.43 Mil/uL (ref 4.22–5.81)
RDW: 13.7 % (ref 11.5–15.5)
WBC: 9.5 10*3/uL (ref 4.0–10.5)

## 2015-04-14 LAB — VITAMIN B12: VITAMIN B 12: 303 pg/mL (ref 211–911)

## 2015-04-14 LAB — BASIC METABOLIC PANEL
BUN: 19 mg/dL (ref 6–23)
CHLORIDE: 102 meq/L (ref 96–112)
CO2: 33 meq/L — AB (ref 19–32)
CREATININE: 1.03 mg/dL (ref 0.40–1.50)
Calcium: 9.8 mg/dL (ref 8.4–10.5)
GFR: 75.23 mL/min (ref >60.00)
Glucose, Bld: 111 mg/dL — ABNORMAL HIGH (ref 70–99)
POTASSIUM: 4.6 meq/L (ref 3.5–5.1)
Sodium: 139 mEq/L (ref 135–145)

## 2015-04-14 LAB — SEDIMENTATION RATE: SED RATE: 3 mm/h (ref 0–22)

## 2015-04-14 LAB — HEPATIC FUNCTION PANEL
ALBUMIN: 4.3 g/dL (ref 3.5–5.2)
ALT: 20 U/L (ref 0–53)
AST: 20 U/L (ref 0–37)
Alkaline Phosphatase: 68 U/L (ref 39–117)
BILIRUBIN DIRECT: 0.1 mg/dL (ref 0.0–0.3)
TOTAL PROTEIN: 7 g/dL (ref 6.0–8.3)
Total Bilirubin: 0.7 mg/dL (ref 0.2–1.2)

## 2015-04-14 LAB — LDL CHOLESTEROL, DIRECT: LDL DIRECT: 121 mg/dL

## 2015-04-14 LAB — TSH: TSH: 1.29 u[IU]/mL (ref 0.35–4.50)

## 2015-04-14 LAB — VITAMIN D 25 HYDROXY (VIT D DEFICIENCY, FRACTURES): VITD: 34.81 ng/mL (ref 30.00–100.00)

## 2015-04-14 MED ORDER — HYDROCODONE-ACETAMINOPHEN 5-325 MG PO TABS: 0.5000 | ORAL_TABLET | Freq: Four times a day (QID) | ORAL | Status: DC | PRN

## 2015-04-14 MED ORDER — VITAMIN D 1000 UNITS PO TABS: 1000.0000 [IU] | ORAL_TABLET | Freq: Every day | ORAL | Status: AC

## 2015-04-14 MED ORDER — TERAZOSIN HCL 1 MG PO CAPS: 1.0000 mg | ORAL_CAPSULE | Freq: Every day | ORAL | Status: DC

## 2015-04-14 NOTE — Assessment & Plan Note (Signed)
BP Readings from Last 3 Encounters:  04/14/15 150/90  04/07/14 128/84  03/19/14 150/94  Declined BP meds

## 2015-04-14 NOTE — Assessment & Plan Note (Signed)
sch colonoscopy

## 2015-04-14 NOTE — Assessment & Plan Note (Signed)
CBC

## 2015-04-14 NOTE — Telephone Encounter (Signed)
Noted. Thx.

## 2015-04-14 NOTE — Assessment & Plan Note (Signed)
Here for medicare wellness/physical  Diet: heart healthy  Physical activity: not sedentary  Depression/mood screen: negative  Hearing: intact to whispered voice  Visual acuity: grossly normal, performs annual eye exam  ADLs: capable  Fall risk: low to none  Home safety: good  Cognitive evaluation: intact to orientation, naming, recall and repetition  EOL planning: adv directives, full code/ I agree  I have personally reviewed and have noted  1. The patient's medical, surgical and social history  2. Their use of alcohol, tobacco or illicit drugs  3. Their current medications and supplements  4. The patient's functional ability including ADL's, fall risks, home safety risks and hearing or visual impairment.  5. Diet and physical activities  6. Evidence for depression or mood disorders 7. The roster of all physicians providing medical care to patient - is listed in the Snapshot section of the chart and reviewed today.    Today patient counseled on age appropriate routine health concerns for screening and prevention, each reviewed and up to date or declined. Immunizations reviewed and up to date or declined. Labs ordered and reviewed. Risk factors for depression reviewed and negative. Hearing function and visual acuity are intact. ADLs screened and addressed as needed. Functional ability and level of safety reviewed and appropriate. Education, counseling and referrals performed based on assessed risks today. Patient provided with a copy of personalized plan for preventive services.    Zostavax suggested Due colonoscopy

## 2015-04-14 NOTE — Assessment & Plan Note (Signed)
Severe leg pain Norco prn  Potential benefits of a long term opioids use as well as potential risks (i.e. addiction risk, apnea etc) and complications (i.e. Somnolence, constipation and others) were explained to the patient and were aknowledged.

## 2015-04-14 NOTE — Telephone Encounter (Signed)
Lab called with critical value for pt's Hgb 18.3

## 2015-04-14 NOTE — Progress Notes (Signed)
Pre visit review using our clinic review tool, if applicable. No additional management support is needed unless otherwise documented below in the visit note. 

## 2015-04-14 NOTE — Progress Notes (Signed)
Subjective:  Patient ID: Henry [name], male    DOB: 1940/10/23  Age: 75 y.o. MRN: [id number]  CC: No chief complaint on file.   HPI Henry [name] presents for a well exam C/o arthritis. C/o legs "go numb" at night in bed, worse w/walking  Outpatient Prescriptions Prior to Visit  Medication Sig Dispense Refill  . aspirin 81 MG EC tablet Take 81 mg by mouth daily.      . Glucosamine-Chondroit-Vit C-Mn (GLUCOSAMINE CHONDR 1500 COMPLX PO) Take by mouth daily.    . meloxicam (MOBIC) 7.5 MG tablet 1 po qd x 2 wks, then prn pain (Patient not taking: Reported on 04/14/2015) 30 tablet 1  . traMADol (ULTRAM) 50 MG tablet Take 1-2 tablets (50-100 mg total) by mouth 2 (two) times daily as needed for moderate pain. (Patient not taking: Reported on 04/14/2015) 60 tablet 3   No facility-administered medications prior to visit.    ROS Review of Systems  Constitutional: Negative for appetite change, fatigue and unexpected weight change.  HENT: Negative for congestion, nosebleeds, sneezing, sore throat and trouble swallowing.   Eyes: Negative for itching and visual disturbance.  Respiratory: Negative for cough.   Cardiovascular: Negative for chest pain, palpitations and leg swelling.  Gastrointestinal: Negative for nausea, abdominal pain, diarrhea, blood in stool and abdominal distention.  Genitourinary: Positive for decreased urine volume and difficulty urinating. Negative for frequency and hematuria.  Musculoskeletal: Negative for back pain, joint swelling, gait problem and neck pain.  Skin: Negative for rash.  Neurological: Positive for numbness. Negative for dizziness, tremors, speech difficulty and weakness.  Psychiatric/Behavioral: Negative for sleep disturbance, dysphoric mood and agitation. The patient is not nervous/anxious.     Objective:  BP 150/90 mmHg  Pulse 76  Ht 5\' 8"  (1.727 m)  Wt 198 lb (89.812 kg)  BMI 30.11 kg/m2  SpO2 96%  BP Readings from Last 3 Encounters:    04/14/15 150/90  04/07/14 128/84  03/19/14 150/94    Wt Readings from Last 3 Encounters:  04/14/15 198 lb (89.812 kg)  04/07/14 193 lb (87.544 kg)  03/19/14 193 lb (87.544 kg)    Physical Exam  Constitutional: He is oriented to person, place, and time. He appears well-developed and well-nourished. No distress.  HENT:  Head: Normocephalic and atraumatic.  Right Ear: External ear normal.  Left Ear: External ear normal.  Nose: Nose normal.  Mouth/Throat: Oropharynx is clear and moist. No oropharyngeal exudate.  Eyes: Conjunctivae and EOM are normal. Pupils are equal, round, and reactive to light. Right eye exhibits no discharge. Left eye exhibits no discharge. No scleral icterus.  Neck: Normal range of motion. Neck supple. No JVD present. No tracheal deviation present. No thyromegaly present.  Cardiovascular: Normal rate, regular rhythm, normal heart sounds and intact distal pulses.  Exam reveals no gallop and no friction rub.   No murmur heard. Pulmonary/Chest: Effort normal and breath sounds normal. No stridor. No respiratory distress. He has no wheezes. He has no rales. He exhibits no tenderness.  Abdominal: Soft. Bowel sounds are normal. He exhibits no distension and no mass. There is no tenderness. There is no rebound and no guarding.  Genitourinary: Rectum normal, prostate normal and penis normal. Guaiac negative stool. No penile tenderness.  Musculoskeletal: Normal range of motion. He exhibits tenderness. He exhibits no edema.  Lymphadenopathy:    He has no cervical adenopathy.  Neurological: He is alert and oriented to person, place, and time. He has normal reflexes. No cranial nerve deficit.  He exhibits normal muscle tone. Coordination normal.  Skin: Skin is warm and dry. No rash noted. He is not diaphoretic. No erythema. No pallor.  Psychiatric: He has a normal mood and affect. His behavior is normal. Judgment and thought content normal.  Pt declined rectal  Lab Results   Component Value Date   WBC 9.5 04/14/2015   HGB 18.3* 04/14/2015   HCT 53.6* 04/14/2015   PLT 214.0 04/14/2015   GLUCOSE 111* 04/14/2015   CHOL 225* 04/14/2015   TRIG 278.0* 04/14/2015   HDL 41.80 04/14/2015   LDLDIRECT 121.0 04/14/2015   ALT 20 04/14/2015   AST 20 04/14/2015   NA 139 04/14/2015   K 4.6 04/14/2015   CL 102 04/14/2015   CREATININE 1.03 04/14/2015   BUN 19 04/14/2015   CO2 33* 04/14/2015   TSH 1.29 04/14/2015   PSA 1.11 04/14/2015    Mr Lumbar Spine Wo Contrast  05/19/2011  *RADIOLOGY REPORT* Clinical Data: Back and bilateral leg pain.  History of spinal stenosis. MRI LUMBAR SPINE WITHOUT CONTRAST Technique:  Multiplanar and multiecho pulse sequences of the lumbar spine were obtained without intravenous contrast. Comparison: Lumbar spine MRI 06/24/2017 2019. Findings: The sagittal MR images demonstrate stable alignment of the lumbar vertebral bodies.  Mild degenerative anterolisthesis of L5 is noted.  There is mild multilevel degenerative disease and moderate facet disease.  No definite pars defects. No significant paraspinal or retroperitoneal process is identified. L1-2:  No significant findings. L2-3:  No significant findings.  Mild hypertrophic facet changes. L3-4:  Diffuse bulging annulus, short pedicles and moderate facet disease contributes to mild spinal and bilateral lateral recess stenosis.  No foraminal stenosis.  This appears relatively stable. L4-5:  Diffuse bulging annulus, short pedicles and advanced facet disease contribute stool moderately severe spinal and bilateral lateral recess stenosis.  No foraminal stenosis.  These findings are slightly progressive. L5-S1:  Diffuse bulging annulus, short pedicles and severe facet disease contribute to severe spinal and bilateral lateral recess stenosis and mild bilateral foraminal stenosis.  These findings are stable. 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  Original Report Authenticated By: P. Kalman Jewels, M.D.   Assessment & Plan:   Diagnoses and all orders for this visit:  Well adult exam -     VITAMIN D 25 Hydroxy (Vit-D Deficiency, Fractures); Future -     Vitamin B12; Future -     TSH; Future -     Sedimentation rate; Future -     Urinalysis; Future -     Lipid panel; Future -     Basic metabolic panel; Future -     CBC with Differential/Platelet; Future -     Hepatic function panel; Future -     PSA; Future  PARESTHESIA -     VITAMIN D 25 Hydroxy (Vit-D Deficiency, Fractures); Future -     Vitamin B12; Future -     TSH; Future -     Sedimentation rate; Future -     Urinalysis; Future -     Lipid panel; Future -     Basic metabolic panel; Future -     CBC with Differential/Platelet; Future -     Hepatic function panel; Future -     PSA; Future  Colon polyp -     VITAMIN D 25 Hydroxy (Vit-D Deficiency, Fractures); Future -     Vitamin B12; Future -     TSH; Future -  Sedimentation rate; Future -     Urinalysis; Future -     Lipid panel; Future -     Basic metabolic panel; Future -     CBC with Differential/Platelet; Future -     Hepatic function panel; Future -     PSA; Future -     Ambulatory referral to Gastroenterology  POLYCYTHEMIA -     VITAMIN D 25 Hydroxy (Vit-D Deficiency, Fractures); Future -     Vitamin B12; Future -     TSH; Future -     Sedimentation rate; Future -     Urinalysis; Future -     Lipid panel; Future -     Basic metabolic panel; Future -     CBC with Differential/Platelet; Future -     Hepatic function panel; Future -     PSA; Future  Essential hypertension -     VITAMIN D 25 Hydroxy (Vit-D Deficiency, Fractures); Future -     Vitamin B12; Future -     TSH; Future -     Sedimentation rate; Future -     Urinalysis; Future -     Lipid panel; Future -     Basic metabolic panel; Future -     CBC with Differential/Platelet; Future -     Hepatic function panel; Future -      PSA; Future  Primary osteoarthritis involving multiple joints -     VITAMIN D 25 Hydroxy (Vit-D Deficiency, Fractures); Future -     Vitamin B12; Future -     TSH; Future -     Sedimentation rate; Future -     Urinalysis; Future -     Lipid panel; Future -     Basic metabolic panel; Future -     CBC with Differential/Platelet; Future -     Hepatic function panel; Future -     PSA; Future  Bladder neck obstruction -     VITAMIN D 25 Hydroxy (Vit-D Deficiency, Fractures); Future -     Vitamin B12; Future -     TSH; Future -     Sedimentation rate; Future -     Urinalysis; Future -     Lipid panel; Future -     Basic metabolic panel; Future -     CBC with Differential/Platelet; Future -     Hepatic function panel; Future -     PSA; Future  Other orders -     terazosin (HYTRIN) 1 MG capsule; Take 1 capsule (1 mg total) by mouth at bedtime. -     HYDROcodone-acetaminophen (NORCO/VICODIN) 5-325 MG tablet; Take 0.5-1 tablets by mouth every 6 (six) hours as needed for severe pain. -     cholecalciferol (VITAMIN D) 1000 units tablet; Take 1 tablet (1,000 Units total) by mouth daily.  I have discontinued Mr. Wardrop traMADol and meloxicam. I am also having him start on terazosin, HYDROcodone-acetaminophen, and cholecalciferol. Additionally, I am having him maintain his aspirin and Glucosamine-Chondroit-Vit C-Mn (GLUCOSAMINE CHONDR 1500 COMPLX PO).  Meds ordered this encounter  Medications  . terazosin (HYTRIN) 1 MG capsule    Sig: Take 1 capsule (1 mg total) by mouth at bedtime.    Dispense:  30 capsule    Refill:  11  . HYDROcodone-acetaminophen (NORCO/VICODIN) 5-325 MG tablet    Sig: Take 0.5-1 tablets by mouth every 6 (six) hours as needed for severe pain.    Dispense:  120 tablet    Refill:  0  .  cholecalciferol (VITAMIN D) 1000 units tablet    Sig: Take 1 tablet (1,000 Units total) by mouth daily.    Dispense:  100 tablet    Refill:  3     Follow-up: Return for a  follow-up visit.  Walker Kehr, MD

## 2015-04-14 NOTE — Assessment & Plan Note (Signed)
Labs

## 2015-04-14 NOTE — Patient Instructions (Signed)

## 2015-05-13 ENCOUNTER — Encounter: Payer: Self-pay | Admitting: Nurse Practitioner

## 2015-05-13 ENCOUNTER — Ambulatory Visit (INDEPENDENT_AMBULATORY_CARE_PROVIDER_SITE_OTHER): Payer: Commercial Managed Care - HMO | Admitting: Nurse Practitioner

## 2015-05-13 ENCOUNTER — Ambulatory Visit (INDEPENDENT_AMBULATORY_CARE_PROVIDER_SITE_OTHER)
Admission: RE | Admit: 2015-05-13 | Discharge: 2015-05-13 | Disposition: A | Discharge status: Home / Self Care | Payer: Commercial Managed Care - HMO | Source: Ambulatory Visit | Attending: Nurse Practitioner | Admitting: Nurse Practitioner

## 2015-05-13 VITALS — BP 150/90 | HR 88 | Temp 97.9°F | Wt 199.0 lb

## 2015-05-13 DIAGNOSIS — M79673 Pain in unspecified foot: Secondary | ICD-10-CM | POA: Insufficient documentation

## 2015-05-13 DIAGNOSIS — M79672 Pain in left foot: Secondary | ICD-10-CM

## 2015-05-13 DIAGNOSIS — M7989 Other specified soft tissue disorders: Secondary | ICD-10-CM | POA: Diagnosis not present

## 2015-05-13 NOTE — Assessment & Plan Note (Addendum)
Probable heel spur New onset x 2 weeks Pt has OA and has seen his PCP about this Advised not to take [facility] powders, but 1 advil or aleve Ice on heel intermittently and X-ray of left foot to look for spuring FU prn worsening/failure to improve.

## 2015-05-13 NOTE — Patient Instructions (Signed)
Heel Spur  A heel spur is a bony growth that forms on the bottom of your heel bone (calcaneus). Heel spurs are common and do not always cause pain. However, heel spurs often cause inflammation in the strong band of tissue that runs underneath the bone of your foot (plantar fascia). When this happens, you may feel pain on the bottom of your foot, near your heel.   CAUSES   The cause of heel spurs is not completely understood. They may be caused by pressure on the heel. Or, they may stem from the muscle attachments (tendons) near the spur pulling on the heel.   RISK FACTORS  You may be at risk for a heel spur if you:  · Are older than 40.  · Are overweight.  · Have wear and tear arthritis (osteoarthritis).  · Have plantar fascia inflammation.  SIGNS AND SYMPTOMS   Some people have heel spurs but no symptoms. If you do have symptoms, they may include:   · Pain in the bottom of your heel.  · Pain that is worse when you first get out of bed.  · Pain that gets worse after walking or standing.  DIAGNOSIS   Your health care provider may diagnose a heel spur based on your symptoms and a physical exam. You may also have an X-ray of your foot to check for a bony growth coming from the calcaneus.   TREATMENT  Treatment aims to relieve the pain from the heel spur. This may include:  · Stretching exercises.  · Losing weight.  · Wearing specific shoes, inserts, or orthotics for comfort and support.  · Wearing splints at night to properly position your feet.  · Taking over-the-counter medicine to relieve pain.  · Being treated with high-intensity sound waves to break up the heel spur (extracorporeal shock wave therapy).  · Getting steroid injections in your heel to reduce swelling and ease pain.  · Having surgery if your heel spur causes long-term (chronic) pain.  HOME CARE INSTRUCTIONS   · Take medicines only as directed by your health care provider.  · Ask your health care provider if you should use ice or cold packs on the  painful areas of your heel or foot.  · Avoid activities that cause you pain until you recover or as directed by your health care provider.  · Stretch before exercising or being physically active.  · Wear supportive shoes that fit well as directed by your health care provider. You might need to buy new shoes. Wearing old shoes or shoes that do not fit correctly may not provide the support that you need.  · Lose weight if your health care provider thinks you should. This can relieve pressure on your foot that may be causing pain and discomfort.  SEEK MEDICAL CARE IF:   · Your pain continues or gets worse.     This information is not intended to replace advice given to you by your health care provider. Make sure you discuss any questions you have with your health care provider.     Document Released: 03/30/2005 Document Revised: 03/14/2014 Document Reviewed: 04/24/2013  [employer] Interactive Patient Education ©2016 [employer]

## 2015-05-13 NOTE — Progress Notes (Signed)
Pre visit review using our clinic review tool, if applicable. No additional management support is needed unless otherwise documented below in the visit note. 

## 2015-05-13 NOTE — Progress Notes (Signed)
Patient ID: Henry [name], male    DOB: 20-Mar-1940  Age: 75 y.o. MRN: [id number]  CC: Foot Swelling   HPI Henry [name] presents for CC of left foot pain x 2 weeks.  1) No known injury or trauma within the last 2 weeks Only hurts at heel  Tender left heel only with walking, happens every time he walks Denies radiation of pain, denies color change or foot wear changes He is wearing [employer] shoes today and reports wearing them often. Denies seeing podiatry in past  Treatment to date: C powder- not helpful   Norco- not helpful   History Henry [name] has a past medical history of LBP (low back pain); Osteoarthritis; Polycythemia (2011); Elevated MCV (2011); and GERD (gastroesophageal reflux disease) (2012).   He has past surgical history that includes Appendectomy.   His family history includes Breast cancer in his other. There is no history of Stroke or Diabetes.He reports that he quit smoking about 38 years ago. His smoking use included Cigarettes. He smoked 1.00 pack per day. He has never used smokeless tobacco. He reports that he does not drink alcohol or use illicit drugs.  Outpatient Prescriptions Prior to Visit  Medication Sig Dispense Refill  . aspirin 81 MG EC tablet Take 81 mg by mouth daily.      . cholecalciferol (VITAMIN D) 1000 units tablet Take 1 tablet (1,000 Units total) by mouth daily. 100 tablet 3  . Glucosamine-Chondroit-Vit C-Mn (GLUCOSAMINE CHONDR 1500 COMPLX PO) Take by mouth daily.    [name] HYDROcodone-acetaminophen (NORCO/VICODIN) 5-325 MG tablet Take 0.5-1 tablets by mouth every 6 (six) hours as needed for severe pain. 120 tablet 0  . terazosin (HYTRIN) 1 MG capsule Take 1 capsule (1 mg total) by mouth at bedtime. 30 capsule 11   No facility-administered medications prior to visit.    ROS Review of Systems  Constitutional: Negative for fever, chills, diaphoresis and fatigue.  Musculoskeletal: Positive for myalgias, arthralgias and gait problem. Negative for back  pain, joint swelling, neck pain and neck stiffness.    Objective:  BP 150/90 mmHg  Pulse 88  Temp(Src) 97.9 F (36.6 C) (Oral)  Wt 199 lb (90.266 kg)  SpO2 95%  Physical Exam  Constitutional: He is oriented to person, place, and time. He appears well-developed and well-nourished. No distress.  HENT:  Head: Normocephalic and atraumatic.  Right Ear: External ear normal.  Left Ear: External ear normal.  Cardiovascular: Normal rate and regular rhythm.   Pulmonary/Chest: Effort normal and breath sounds normal. No respiratory distress. He has no wheezes. He has no rales. He exhibits no tenderness.  Musculoskeletal: Normal range of motion. He exhibits no edema or tenderness.  Left foot- full ROM, NO edema or tenderness elicited with palpation of dorsal and plantar sides, heel, around the achilles, ankles ect...   Neurological: He is alert and oriented to person, place, and time.  Skin: Skin is warm and dry. No rash noted. He is not diaphoretic.  Psychiatric: He has a normal mood and affect. His behavior is normal. Judgment and thought content normal.   Assessment & Plan:   Henry [name] was seen today for foot swelling.  Diagnoses and all orders for this visit:  Heel pain, left -     DG Foot Complete Left; Future  I am having Henry [name] maintain his aspirin, Glucosamine-Chondroit-Vit C-Mn (GLUCOSAMINE CHONDR 1500 COMPLX PO), terazosin, HYDROcodone-acetaminophen, and cholecalciferol.  No orders of the defined types were placed in this encounter.  Follow-up: Return if symptoms worsen or fail to improve.

## 2015-05-18 ENCOUNTER — Ambulatory Visit (AMBULATORY_SURGERY_CENTER): Payer: Self-pay | Admitting: *Deleted

## 2015-05-18 VITALS — Ht 68.0 in | Wt 201.0 lb

## 2015-05-18 DIAGNOSIS — Z86010 Personal history of colonic polyps: Secondary | ICD-10-CM

## 2015-05-18 NOTE — Progress Notes (Signed)
Patient denies any allergies to eggs or soy. Patient denies any problems with anesthesia/sedation. Patient denies any oxygen use at home and does not take any diet/weight loss medications.  

## 2015-06-08 ENCOUNTER — Encounter: Payer: Self-pay | Admitting: Internal Medicine

## 2015-06-08 ENCOUNTER — Ambulatory Visit (AMBULATORY_SURGERY_CENTER): Payer: Commercial Managed Care - HMO | Admitting: Internal Medicine

## 2015-06-08 VITALS — BP 114/60 | HR 69 | Temp 98.9°F | Resp 12 | Ht 68.0 in | Wt 193.0 lb

## 2015-06-08 DIAGNOSIS — D124 Benign neoplasm of descending colon: Secondary | ICD-10-CM

## 2015-06-08 DIAGNOSIS — D122 Benign neoplasm of ascending colon: Secondary | ICD-10-CM

## 2015-06-08 DIAGNOSIS — D125 Benign neoplasm of sigmoid colon: Secondary | ICD-10-CM | POA: Diagnosis not present

## 2015-06-08 DIAGNOSIS — Z860100 Personal history of colon polyps, unspecified: Secondary | ICD-10-CM

## 2015-06-08 DIAGNOSIS — D751 Secondary polycythemia: Secondary | ICD-10-CM | POA: Diagnosis not present

## 2015-06-08 DIAGNOSIS — I10 Essential (primary) hypertension: Secondary | ICD-10-CM | POA: Diagnosis not present

## 2015-06-08 DIAGNOSIS — D120 Benign neoplasm of cecum: Secondary | ICD-10-CM

## 2015-06-08 DIAGNOSIS — D123 Benign neoplasm of transverse colon: Secondary | ICD-10-CM | POA: Diagnosis not present

## 2015-06-08 DIAGNOSIS — K219 Gastro-esophageal reflux disease without esophagitis: Secondary | ICD-10-CM | POA: Diagnosis not present

## 2015-06-08 MED ORDER — SODIUM CHLORIDE 0.9 % IV SOLN: 500.0000 mL | INTRAVENOUS | Status: DC

## 2015-06-08 NOTE — Progress Notes (Signed)
Report given to PACU RN, vss 

## 2015-06-08 NOTE — Patient Instructions (Addendum)
I found and removed 6 polyps - all look benign.  I will let you know pathology results and when to have another routine colonoscopy by mail.  I appreciate the opportunity to care for you. Gatha Mayer, MD, FACG  YOU HAD AN ENDOSCOPIC PROCEDURE TODAY AT [facility]:   Refer to the procedure report that was given to you for any specific questions about what was found during the examination.  If the procedure report does not answer your questions, please call your gastroenterologist to clarify.  If you requested that your care partner not be given the details of your procedure findings, then the procedure report has been included in a sealed envelope for you to review at your convenience later.  YOU SHOULD EXPECT: Some feelings of bloating in the abdomen. Passage of more gas than usual.  Walking can help get rid of the air that was put into your GI tract during the procedure and reduce the bloating. If you had a lower endoscopy (such as a colonoscopy or flexible sigmoidoscopy) you may notice spotting of blood in your stool or on the toilet paper. If you underwent a bowel prep for your procedure, you may not have a normal bowel movement for a few days.  Please Note:  You might notice some irritation and congestion in your nose or some drainage.  This is from the oxygen used during your procedure.  There is no need for concern and it should clear up in a day or so.  SYMPTOMS TO REPORT IMMEDIATELY:   Following lower endoscopy (colonoscopy or flexible sigmoidoscopy):  Excessive amounts of blood in the stool  Significant tenderness or worsening of abdominal pains  Swelling of the abdomen that is new, acute  Fever of 100F or higher  For urgent or emergent issues, a gastroenterologist can be reached at any hour by calling [phone].   DIET: Your first meal following the procedure should be a small meal and then it is ok to progress to your normal diet. Heavy or fried  foods are harder to digest and may make you feel nauseous or bloated.  Likewise, meals heavy in dairy and vegetables can increase bloating.  Drink plenty of fluids but you should avoid alcoholic beverages for 24 hours.  ACTIVITY:  You should plan to take it easy for the rest of today and you should NOT DRIVE or use heavy machinery until tomorrow (because of the sedation medicines used during the test).    FOLLOW UP: Our staff will call the number listed on your records the next business day following your procedure to check on you and address any questions or concerns that you may have regarding the information given to you following your procedure. If we do not reach you, we will leave a message.  However, if you are feeling well and you are not experiencing any problems, there is no need to return our call.  We will assume that you have returned to your regular daily activities without incident.  If any biopsies were taken you will be contacted by phone or by letter within the next 1-3 weeks.  Please call us at [phone] if you have not heard about the biopsies in 3 weeks.   SIGNATURES/CONFIDENTIALITY: You and/or your care partner have signed paperwork which will be entered into your electronic medical record.  These signatures attest to the fact that that the information above on your After Visit Summary has been reviewed and is understood.  Full responsibility of the confidentiality of this discharge information lies with you and/or your care-partner.  Please continue your normal medications  Please read over handouts about polyps and diverticulosis

## 2015-06-08 NOTE — Progress Notes (Signed)
Called to room to assist during endoscopic procedure.  Patient ID and intended procedure confirmed with present staff. Received instructions for my participation in the procedure from the performing physician.  

## 2015-06-08 NOTE — Op Note (Signed)
[place] Patient Name: Henry [name] Procedure Date: 06/08/2015 10:51 AM MRN: [id number] Endoscopist: Gatha Mayer , MD Age: 75 Referring MD:  Date of Birth: September 28, 1940 Gender: Male Procedure:                Colonoscopy Indications:              Surveillance: Personal history of adenomatous                            polyps on last colonoscopy > 5 years ago Medicines:                Propofol per Anesthesia, Monitored Anesthesia Care Procedure:                Pre-Anesthesia Assessment:                           - Prior to the procedure, a History and Physical                            was performed, and patient medications and                            allergies were reviewed. The patient's tolerance of                            previous anesthesia was also reviewed. The risks                            and benefits of the procedure and the sedation                            options and risks were discussed with the patient.                            All questions were answered, and informed consent                            was obtained. Prior Anticoagulants: The patient has                            taken no previous anticoagulant or antiplatelet                            agents. ASA Grade Assessment: II - A patient with                            mild systemic disease. After reviewing the risks                            and benefits, the patient was deemed in                            satisfactory condition to undergo the procedure.  After obtaining informed consent, the colonoscope                            was passed under direct vision. Throughout the                            procedure, the patient's blood pressure, pulse, and                            oxygen saturations were monitored continuously. The                            Model CF-HQ190L [id number]) scope was introduced                            through the anus and  advanced to the the cecum,                            identified by appendiceal orifice and ileocecal                            valve. The colonoscopy was performed without                            difficulty. The patient tolerated the procedure                            well. The quality of the bowel preparation was                            excellent. The bowel preparation used was Miralax.                            The ileocecal valve, appendiceal orifice, and                            rectum were photographed. Scope In: 11:07:09 AM Scope Out: 11:27:15 AM Scope Withdrawal Time: 0 hours 16 minutes 32 seconds  Total Procedure Duration: 0 hours 20 minutes 6 seconds  Findings:      The perianal and digital rectal examinations were normal. Pertinent       negatives include normal prostate (size, shape, and consistency).      Six sessile polyps were found in the sigmoid colon, descending colon,       transverse colon, ascending colon and cecum. The polyps were 2 to 6 mm       in size. These polyps were removed with a cold snare. Resection and       retrieval were complete. Verification of patient identification for the       specimen was done. Estimated blood loss was minimal.      Diverticula were found in the sigmoid colon.      The exam was otherwise without abnormality on direct and retroflexion       views. Complications:            No immediate complications. Estimated Blood Loss:  Estimated blood loss was minimal. Impression:               - Six 2 to 6 mm polyps in the sigmoid colon, in the                            descending colon, in the transverse colon, in the                            ascending colon and in the cecum, removed with a                            cold snare. Resected and retrieved.                           - Mild diverticulosis in the sigmoid colon.                           - The examination was otherwise normal on direct                             and retroflexion views. Recommendation:           - Patient has a contact number available for                            emergencies. The signs and symptoms of potential                            delayed complications were discussed with the                            patient. Return to normal activities tomorrow.                            Written discharge instructions were provided to the                            patient.                           - Resume previous diet.                           - Continue present medications.                           - Repeat colonoscopy is recommended for                            surveillance. The colonoscopy date will be                            determined after pathology results from today's  exam become available for review. Procedure Code(s):        --- Professional ---                           [phone], Colonoscopy, flexible; with removal of                            tumor(s), polyp(s), or other lesion(s) by snare                            technique CPT copyright 2016 American Medical Association. All rights reserved. Gatha Mayer, MD 06/08/2015 11:45:07 AM This report has been signed electronically. Number of Addenda: 0 Referring MD:

## 2015-06-09 ENCOUNTER — Telehealth: Payer: Self-pay

## 2015-06-09 NOTE — Telephone Encounter (Signed)
Left message on answering machine. 

## 2015-06-11 ENCOUNTER — Encounter: Payer: Self-pay | Admitting: Internal Medicine

## 2015-06-11 DIAGNOSIS — Z86010 Personal history of colonic polyps: Secondary | ICD-10-CM

## 2015-06-11 NOTE — Progress Notes (Signed)
Quick Note:  5 adenomas - recall colon 2020 ______

## 2015-12-01 ENCOUNTER — Encounter: Payer: Self-pay | Admitting: Internal Medicine

## 2015-12-01 ENCOUNTER — Ambulatory Visit (INDEPENDENT_AMBULATORY_CARE_PROVIDER_SITE_OTHER): Payer: Commercial Managed Care - HMO | Admitting: Internal Medicine

## 2015-12-01 DIAGNOSIS — M79604 Pain in right leg: Secondary | ICD-10-CM

## 2015-12-01 DIAGNOSIS — N400 Enlarged prostate without lower urinary tract symptoms: Secondary | ICD-10-CM | POA: Insufficient documentation

## 2015-12-01 DIAGNOSIS — D751 Secondary polycythemia: Secondary | ICD-10-CM

## 2015-12-01 DIAGNOSIS — Z23 Encounter for immunization: Secondary | ICD-10-CM

## 2015-12-01 DIAGNOSIS — M5440 Lumbago with sciatica, unspecified side: Secondary | ICD-10-CM | POA: Diagnosis not present

## 2015-12-01 DIAGNOSIS — M79605 Pain in left leg: Secondary | ICD-10-CM

## 2015-12-01 MED ORDER — PREDNISONE 10 MG PO TABS: ORAL_TABLET | ORAL | 1 refills | Status: DC

## 2015-12-01 MED ORDER — HYDROCODONE-ACETAMINOPHEN 5-325 MG PO TABS: 0.5000 | ORAL_TABLET | Freq: Four times a day (QID) | ORAL | 0 refills | Status: DC | PRN

## 2015-12-01 MED ORDER — FINASTERIDE 5 MG PO TABS: 5.0000 mg | ORAL_TABLET | Freq: Every day | ORAL | 11 refills | Status: DC

## 2015-12-01 NOTE — Assessment & Plan Note (Signed)
Start Proscar. 

## 2015-12-01 NOTE — Patient Instructions (Signed)
Spinal Stenosis Spinal stenosis is an abnormal narrowing of the canals of your spine (vertebrae). CAUSES  Spinal stenosis is caused by areas of bone pushing into the central canals of your vertebrae. This condition can be present at birth (congenital). It also may be caused by arthritic deterioration of your vertebrae (spinal degeneration).  SYMPTOMS   Pain that is generally worse with activities, particularly standing and walking.  Numbness, tingling, hot or cold sensations, weakness, or weariness in your legs.  Frequent episodes of falling.  A foot-slapping gait that leads to muscle weakness. DIAGNOSIS  Spinal stenosis is diagnosed with the use of magnetic resonance imaging (MRI) or computed tomography (CT). TREATMENT  Initial therapy for spinal stenosis focuses on the management of the pain and other symptoms associated with the condition. These therapies include:  Practicing postural changes to lessen pressure on your nerves.  Exercises to strengthen the core of your body.  Loss of excess body weight.  The use of nonsteroidal anti-inflammatory medicines to reduce swelling and inflammation in your nerves. When therapies to manage pain are not successful, surgery to treat spinal stenosis may be recommended. This surgery involves removing excess bone, which puts pressure on your nerve roots. During this surgery (laminectomy), the posterior boney arch (lamina) and excess bone around the facet joints are removed.   This information is not intended to replace advice given to you by your health care provider. Make sure you discuss any questions you have with your health care provider.   Document Released: 05/14/2003 Document Revised: 03/14/2014 Document Reviewed: 06/01/2012 Elsevier Interactive Patient Education 2016 [employer] Inc[employer]  

## 2015-12-01 NOTE — Progress Notes (Signed)
Subjective:  Patient ID: Henry [name], male    DOB: 08-11-1940  Age: 75 y.o. MRN: [id number]  CC: Back Pain and Numbness (Bilateral legs)   HPI Henry [name] presents for LBP, leg numbness, worse w/walking and standing. C/o not having control over legs. There were issues w/falling. LBP is 10.10 w/activity. It has been getting worse in the last 7-8 years. No issues w/BMs. C/o urinary difficulties at times. No injury. Not taking Terasosin.  Outpatient Medications Prior to Visit  Medication Sig Dispense Refill  . aspirin 81 MG EC tablet Take 81 mg by mouth daily.      . cholecalciferol (VITAMIN D) 1000 units tablet Take 1 tablet (1,000 Units total) by mouth daily. 100 tablet 3  . Glucosamine-Chondroit-Vit C-Mn (GLUCOSAMINE CHONDR 1500 COMPLX PO) Take by mouth daily.    [name] HYDROcodone-acetaminophen (NORCO/VICODIN) 5-325 MG tablet Take 0.5-1 tablets by mouth every 6 (six) hours as needed for severe pain. 120 tablet 0  . terazosin (HYTRIN) 1 MG capsule Take 1 capsule (1 mg total) by mouth at bedtime. 30 capsule 11   No facility-administered medications prior to visit.     ROS Review of Systems  Constitutional: Negative for appetite change, fatigue and unexpected weight change.  HENT: Negative for congestion, nosebleeds, sneezing, sore throat and trouble swallowing.   Eyes: Negative for itching and visual disturbance.  Respiratory: Negative for cough.   Cardiovascular: Negative for chest pain, palpitations and leg swelling.  Gastrointestinal: Negative for abdominal distention, blood in stool, diarrhea and nausea.  Genitourinary: Positive for difficulty urinating. Negative for frequency and hematuria.  Musculoskeletal: Positive for back pain and gait problem. Negative for joint swelling, neck pain and neck stiffness.  Skin: Negative for rash.  Neurological: Negative for dizziness, tremors, speech difficulty and weakness.  Psychiatric/Behavioral: Negative for agitation, dysphoric mood  and sleep disturbance. The patient is not nervous/anxious.     Objective:  BP (!) 144/80   Pulse 80   Temp 97.9 F (36.6 C) (Oral)   Wt 193 lb (87.5 kg)   SpO2 94%   BMI 29.35 kg/m   BP Readings from Last 3 Encounters:  12/01/15 (!) 144/80  06/08/15 114/60  05/13/15 (!) 150/90    Wt Readings from Last 3 Encounters:  12/01/15 193 lb (87.5 kg)  06/08/15 193 lb (87.5 kg)  05/18/15 201 lb (91.2 kg)    Physical Exam  Constitutional: He is oriented to person, place, and time. He appears well-developed. No distress.  NAD  HENT:  Mouth/Throat: Oropharynx is clear and moist.  Eyes: Conjunctivae are normal. Pupils are equal, round, and reactive to light.  Neck: Normal range of motion. No JVD present. No thyromegaly present.  Cardiovascular: Normal rate, regular rhythm, normal heart sounds and intact distal pulses.  Exam reveals no gallop and no friction rub.   No murmur heard. Pulmonary/Chest: Effort normal and breath sounds normal. No respiratory distress. He has no wheezes. He has no rales. He exhibits no tenderness.  Abdominal: Soft. Bowel sounds are normal. He exhibits no distension and no mass. There is no tenderness. There is no rebound and no guarding.  Genitourinary: Rectum normal. Rectal exam shows guaiac negative stool.  Musculoskeletal: Normal range of motion. He exhibits tenderness. He exhibits no edema.  Lymphadenopathy:    He has no cervical adenopathy.  Neurological: He is alert and oriented to person, place, and time. He has normal reflexes. No cranial nerve deficit. He exhibits normal muscle tone. He displays a negative Romberg sign.  Coordination and gait normal.  Skin: Skin is warm and dry. No rash noted.  Psychiatric: He has a normal mood and affect. His behavior is normal. Judgment and thought content normal.  Prostate 2+ LS w/good ROM Str leg elev (-)  Lab Results  Component Value Date   WBC 9.5 04/14/2015   HGB 18.3 (HH) 04/14/2015   HCT 53.6 (H)  04/14/2015   PLT 214.0 04/14/2015   GLUCOSE 111 (H) 04/14/2015   CHOL 225 (H) 04/14/2015   TRIG 278.0 (H) 04/14/2015   HDL 41.80 04/14/2015   LDLDIRECT 121.0 04/14/2015   ALT 20 04/14/2015   AST 20 04/14/2015   NA 139 04/14/2015   K 4.6 04/14/2015   CL 102 04/14/2015   CREATININE 1.03 04/14/2015   BUN 19 04/14/2015   CO2 33 (H) 04/14/2015   TSH 1.29 04/14/2015   PSA 1.11 04/14/2015    Dg Foot Complete Left  Result Date: 05/13/2015 CLINICAL DATA:  Two weeks of left heel pain without known injury. Mild soft tissue swelling. History of osteoarthritis. EXAM: LEFT FOOT - COMPLETE 3+ VIEW COMPARISON:  None in PACs FINDINGS: The bones of the left foot are adequately mineralized. The interphalangeal, metatarsophalangeal, and tarsometatarsal joints are normal. The intertarsal joint spaces are preserved where visualized. There is an Achilles region calcaneal spur. There is no significant plantar spurring. The soft tissues of the foot exhibit no abnormal densities or foreign bodies. IMPRESSION: No acute bony abnormality is observed. Certainly plantar fasciitis can be present and have no radiographic findings. If further imaging is felt indicated clinically, MRI would be the most useful next imaging step. Electronically Signed   By: David  Martinique M.D.   On: 05/13/2015 11:13    Assessment & Plan:   There are no diagnoses linked to this encounter. I am having Mr. Cooksey maintain his aspirin, Glucosamine-Chondroit-Vit C-Mn (GLUCOSAMINE CHONDR 1500 COMPLX PO), terazosin, HYDROcodone-acetaminophen, and cholecalciferol.  No orders of the defined types were placed in this encounter.    Follow-up: No Follow-up on file.  Walker Kehr, MD

## 2015-12-01 NOTE — Addendum Note (Signed)
Addended by: Cresenciano Lick on: 12/01/2015 08:58 AM   Modules accepted: Orders

## 2015-12-01 NOTE — Assessment & Plan Note (Signed)
9/17 worsening sx's Chronic  Prednisone 10 mg: take 4 tabs a day x 3 days; then 3 tabs a day x 4 days; then 2 tabs a day x 4 days, then 1 tab a day x 6 days, then stop. Take pc. Norco prn Dr Ellene Route if not better

## 2015-12-01 NOTE — Progress Notes (Signed)
Pre visit review using our clinic review tool, if applicable. No additional management support is needed unless otherwise documented below in the visit note. 

## 2015-12-01 NOTE — Assessment & Plan Note (Signed)
9/17 worsening sx's B LE pain due to spinal stenosis Chronic  Prednisone 10 mg: take 4 tabs a day x 3 days; then 3 tabs a day x 4 days; then 2 tabs a day x 4 days, then 1 tab a day x 6 days, then stop. Take pc. Norco prn Dr Ellene Route if not better

## 2015-12-01 NOTE — Assessment & Plan Note (Signed)
[facility] q 52 days

## 2015-12-15 ENCOUNTER — Ambulatory Visit (INDEPENDENT_AMBULATORY_CARE_PROVIDER_SITE_OTHER): Payer: Commercial Managed Care - HMO | Admitting: Internal Medicine

## 2015-12-15 ENCOUNTER — Encounter: Payer: Self-pay | Admitting: Internal Medicine

## 2015-12-15 DIAGNOSIS — R338 Other retention of urine: Secondary | ICD-10-CM

## 2015-12-15 DIAGNOSIS — M5440 Lumbago with sciatica, unspecified side: Secondary | ICD-10-CM

## 2015-12-15 DIAGNOSIS — I10 Essential (primary) hypertension: Secondary | ICD-10-CM | POA: Diagnosis not present

## 2015-12-15 DIAGNOSIS — N401 Enlarged prostate with lower urinary tract symptoms: Secondary | ICD-10-CM

## 2015-12-15 DIAGNOSIS — D751 Secondary polycythemia: Secondary | ICD-10-CM

## 2015-12-15 MED ORDER — IBUPROFEN 600 MG PO TABS: 600.0000 mg | ORAL_TABLET | Freq: Three times a day (TID) | ORAL | 1 refills | Status: DC | PRN

## 2015-12-15 NOTE — Progress Notes (Signed)
Pre visit review using our clinic review tool, if applicable. No additional management support is needed unless otherwise documented below in the visit note. 

## 2015-12-15 NOTE — Assessment & Plan Note (Signed)
[facility] ref - Dr Vira Blanco to consider epidural spinal injections

## 2015-12-15 NOTE — Assessment & Plan Note (Signed)
[facility] for blood donations

## 2015-12-15 NOTE — Assessment & Plan Note (Signed)
NAS diet 

## 2015-12-15 NOTE — Progress Notes (Signed)
Subjective:  Patient ID: Henry [name], male    DOB: 1940/08/05  Age: 75 y.o. MRN: [id number]  CC: No chief complaint on file.   HPI Henry [name] presents for LBP - 20% better after Prednisone. Pain is up to 10/10 at times. F/u HTN, BPH  Outpatient Medications Prior to Visit  Medication Sig Dispense Refill  . aspirin 81 MG EC tablet Take 81 mg by mouth daily.      . cholecalciferol (VITAMIN D) 1000 units tablet Take 1 tablet (1,000 Units total) by mouth daily. 100 tablet 3  . finasteride (PROSCAR) 5 MG tablet Take 1 tablet (5 mg total) by mouth daily. 30 tablet 11  . Glucosamine-Chondroit-Vit C-Mn (GLUCOSAMINE CHONDR 1500 COMPLX PO) Take by mouth daily.    [name] HYDROcodone-acetaminophen (NORCO/VICODIN) 5-325 MG tablet Take 0.5-1 tablets by mouth every 6 (six) hours as needed for severe pain. 60 tablet 0  . predniSONE (DELTASONE) 10 MG tablet Prednisone 10 mg: take 4 tabs a day x 3 days; then 3 tabs a day x 4 days; then 2 tabs a day x 4 days, then 1 tab a day x 6 days, then stop. Take pc. 38 tablet 1   No facility-administered medications prior to visit.     ROS Review of Systems  Constitutional: Negative for appetite change, fatigue and unexpected weight change.  HENT: Negative for congestion, nosebleeds, sneezing, sore throat and trouble swallowing.   Eyes: Negative for itching and visual disturbance.  Respiratory: Negative for cough.   Cardiovascular: Negative for chest pain, palpitations and leg swelling.  Gastrointestinal: Negative for abdominal distention, blood in stool, diarrhea and nausea.  Genitourinary: Negative for frequency and hematuria.  Musculoskeletal: Positive for back pain and gait problem. Negative for joint swelling and neck pain.  Skin: Negative for rash.  Neurological: Negative for dizziness, tremors, speech difficulty and weakness.  Psychiatric/Behavioral: Negative for agitation, dysphoric mood and sleep disturbance. The patient is not nervous/anxious.      Objective:  BP 138/88   Pulse 79   Wt 192 lb (87.1 kg)   SpO2 97%   BMI 29.19 kg/m   BP Readings from Last 3 Encounters:  12/15/15 138/88  12/01/15 (!) 144/80  06/08/15 114/60    Wt Readings from Last 3 Encounters:  12/15/15 192 lb (87.1 kg)  12/01/15 193 lb (87.5 kg)  06/08/15 193 lb (87.5 kg)    Physical Exam  Constitutional: He is oriented to person, place, and time. He appears well-developed. No distress.  NAD  HENT:  Mouth/Throat: Oropharynx is clear and moist.  Eyes: Conjunctivae are normal. Pupils are equal, round, and reactive to light.  Neck: Normal range of motion. No JVD present. No thyromegaly present.  Cardiovascular: Normal rate, regular rhythm, normal heart sounds and intact distal pulses.  Exam reveals no gallop and no friction rub.   No murmur heard. Pulmonary/Chest: Effort normal and breath sounds normal. No respiratory distress. He has no wheezes. He has no rales. He exhibits no tenderness.  Abdominal: Soft. Bowel sounds are normal. He exhibits no distension and no mass. There is no tenderness. There is no rebound and no guarding.  Musculoskeletal: Normal range of motion. He exhibits no edema or tenderness.  Lymphadenopathy:    He has no cervical adenopathy.  Neurological: He is alert and oriented to person, place, and time. He has normal reflexes. No cranial nerve deficit. He exhibits normal muscle tone. He displays a negative Romberg sign. Coordination and gait normal.  Skin: Skin is  warm and dry. No rash noted.  Psychiatric: He has a normal mood and affect. His behavior is normal. Judgment and thought content normal.    Lab Results  Component Value Date   WBC 9.5 04/14/2015   HGB 18.3 (HH) 04/14/2015   HCT 53.6 (H) 04/14/2015   PLT 214.0 04/14/2015   GLUCOSE 111 (H) 04/14/2015   CHOL 225 (H) 04/14/2015   TRIG 278.0 (H) 04/14/2015   HDL 41.80 04/14/2015   LDLDIRECT 121.0 04/14/2015   ALT 20 04/14/2015   AST 20 04/14/2015   NA 139  04/14/2015   K 4.6 04/14/2015   CL 102 04/14/2015   CREATININE 1.03 04/14/2015   BUN 19 04/14/2015   CO2 33 (H) 04/14/2015   TSH 1.29 04/14/2015   PSA 1.11 04/14/2015    Dg Foot Complete Left  Result Date: 05/13/2015 CLINICAL DATA:  Two weeks of left heel pain without known injury. Mild soft tissue swelling. History of osteoarthritis. EXAM: LEFT FOOT - COMPLETE 3+ VIEW COMPARISON:  None in PACs FINDINGS: The bones of the left foot are adequately mineralized. The interphalangeal, metatarsophalangeal, and tarsometatarsal joints are normal. The intertarsal joint spaces are preserved where visualized. There is an Achilles region calcaneal spur. There is no significant plantar spurring. The soft tissues of the foot exhibit no abnormal densities or foreign bodies. IMPRESSION: No acute bony abnormality is observed. Certainly plantar fasciitis can be present and have no radiographic findings. If further imaging is felt indicated clinically, MRI would be the most useful next imaging step. Electronically Signed   By: David  Martinique M.D.   On: 05/13/2015 11:13    Assessment & Plan:   There are no diagnoses linked to this encounter. I have discontinued Mr. Mackellar predniSONE. I am also having him maintain his aspirin, Glucosamine-Chondroit-Vit C-Mn (GLUCOSAMINE CHONDR 1500 COMPLX PO), cholecalciferol, HYDROcodone-acetaminophen, and finasteride.  No orders of the defined types were placed in this encounter.    Follow-up: No Follow-up on file.  Walker Kehr, MD

## 2015-12-15 NOTE — Assessment & Plan Note (Signed)
Proscar 

## 2016-01-06 ENCOUNTER — Ambulatory Visit (INDEPENDENT_AMBULATORY_CARE_PROVIDER_SITE_OTHER): Payer: Commercial Managed Care - HMO | Admitting: Internal Medicine

## 2016-01-06 ENCOUNTER — Encounter: Payer: Self-pay | Admitting: Internal Medicine

## 2016-01-06 DIAGNOSIS — M5442 Lumbago with sciatica, left side: Secondary | ICD-10-CM

## 2016-01-06 DIAGNOSIS — I10 Essential (primary) hypertension: Secondary | ICD-10-CM | POA: Diagnosis not present

## 2016-01-06 DIAGNOSIS — R209 Unspecified disturbances of skin sensation: Secondary | ICD-10-CM | POA: Diagnosis not present

## 2016-01-06 DIAGNOSIS — G8929 Other chronic pain: Secondary | ICD-10-CM

## 2016-01-06 DIAGNOSIS — M5441 Lumbago with sciatica, right side: Secondary | ICD-10-CM | POA: Diagnosis not present

## 2016-01-06 MED ORDER — HYDROCODONE-ACETAMINOPHEN 5-325 MG PO TABS: 0.5000 | ORAL_TABLET | Freq: Four times a day (QID) | ORAL | 0 refills | Status: DC | PRN

## 2016-01-06 NOTE — Patient Instructions (Signed)
Go to ER if worse 

## 2016-01-06 NOTE — Assessment & Plan Note (Addendum)
LBP - not better. No feeling in the legs at times. He fell a couple times due to sudden pain and weakness. Ibuprofen prn. Using Norco prn NS ref Dr Ellene Route  Potential benefits of a long term opioids use as well as potential risks (i.e. addiction risk, apnea etc) and complications (i.e. Somnolence, constipation and others) were explained to the patient and were aknowledged.

## 2016-01-06 NOTE — Assessment & Plan Note (Signed)
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

## 2016-01-06 NOTE — Progress Notes (Signed)
Subjective:  Patient ID: Henry [name], male    DOB: [birth year]  Age: 75 y.o. MRN: [id number]  CC: Numbness (Bilateral legs); Fall (Earlier this week, when he stood up from sitting his legs "gave out"); and Back Pain (Referral notes in chart states referral to [facility] has been received and is being reviewed. Can still take up to 4-6 weeks. 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 No feeling in the legs at times. He fell a couple times due to sudden pain and weakness. Using Norco prn  Outpatient Medications Prior to Visit  Medication Sig Dispense Refill  . aspirin 81 MG EC tablet Take 81 mg by mouth daily.      . cholecalciferol (VITAMIN D) 1000 units tablet Take 1 tablet (1,000 Units total) by mouth daily. 100 tablet 3  . finasteride (PROSCAR) 5 MG tablet Take 1 tablet (5 mg total) by mouth daily. 30 tablet 11  . Glucosamine-Chondroit-Vit C-Mn (GLUCOSAMINE CHONDR 1500 COMPLX PO) Take by mouth daily.    [name] HYDROcodone-acetaminophen (NORCO/VICODIN) 5-325 MG tablet Take 0.5-1 tablets by mouth every 6 (six) hours as needed for severe pain. 60 tablet 0  . ibuprofen (ADVIL,MOTRIN) 600 MG tablet Take 1 tablet (600 mg total) by mouth every 8 (eight) hours as needed. 90 tablet 1   No facility-administered medications prior to visit.     ROS Review of Systems  Constitutional: Negative for appetite change, fatigue and unexpected weight change.  HENT: Negative for congestion, nosebleeds, sneezing, sore throat and trouble swallowing.   Eyes: Negative for itching and visual disturbance.  Respiratory: Negative for cough.   Cardiovascular: Negative for chest pain, palpitations and leg swelling.  Gastrointestinal: Negative for abdominal distention, blood in stool, diarrhea and nausea.  Genitourinary: Negative for frequency and hematuria.  Musculoskeletal: Positive for back pain and gait problem. Negative for joint swelling and neck pain.  Skin: Negative for rash.    Neurological: Positive for weakness. Negative for dizziness, tremors and speech difficulty.  Psychiatric/Behavioral: Negative for agitation, dysphoric mood and sleep disturbance. The patient is not nervous/anxious.   tripping L>R  Objective:  BP (!) 150/90   Pulse 83   Wt 194 lb (88 kg)   SpO2 97%   BMI 29.50 kg/m   BP Readings from Last 3 Encounters:  01/06/16 (!) 150/90  12/15/15 138/88  12/01/15 (!) 144/80    Wt Readings from Last 3 Encounters:  01/06/16 194 lb (88 kg)  12/15/15 192 lb (87.1 kg)  12/01/15 193 lb (87.5 kg)    Physical Exam  Constitutional: He is oriented to person, place, and time. He appears well-developed. No distress.  NAD  HENT:  Mouth/Throat: Oropharynx is clear and moist.  Eyes: Conjunctivae are normal. Pupils are equal, round, and reactive to light.  Neck: Normal range of motion. No JVD present. No thyromegaly present.  Cardiovascular: Normal rate, regular rhythm, normal heart sounds and intact distal pulses.  Exam reveals no gallop and no friction rub.   No murmur heard. Pulmonary/Chest: Effort normal and breath sounds normal. No respiratory distress. He has no wheezes. He has no rales. He exhibits no tenderness.  Abdominal: Soft. Bowel sounds are normal. He exhibits no distension and no mass. There is no tenderness. There is no rebound and no guarding.  Musculoskeletal: Normal range of motion. He exhibits tenderness. He exhibits no edema.  Lymphadenopathy:    He has no cervical adenopathy.  Neurological: He is alert and oriented to person,  place, and time. He has normal reflexes. No cranial nerve deficit. He exhibits normal muscle tone. He displays a negative Romberg sign. Coordination and gait normal.  Skin: Skin is warm and dry. No rash noted.  Psychiatric: He has a normal mood and affect. His behavior is normal. Judgment and thought content normal.  str leg elev is (-) B LLE with subtle weakness  Lab Results  Component Value Date   WBC  9.5 04/14/2015   HGB 18.3 (HH) 04/14/2015   HCT 53.6 (H) 04/14/2015   PLT 214.0 04/14/2015   GLUCOSE 111 (H) 04/14/2015   CHOL 225 (H) 04/14/2015   TRIG 278.0 (H) 04/14/2015   HDL 41.80 04/14/2015   LDLDIRECT 121.0 04/14/2015   ALT 20 04/14/2015   AST 20 04/14/2015   NA 139 04/14/2015   K 4.6 04/14/2015   CL 102 04/14/2015   CREATININE 1.03 04/14/2015   BUN 19 04/14/2015   CO2 33 (H) 04/14/2015   TSH 1.29 04/14/2015   PSA 1.11 04/14/2015    Dg Foot Complete Left  Result Date: 05/13/2015 CLINICAL DATA:  Two weeks of left heel pain without known injury. Mild soft tissue swelling. History of osteoarthritis. EXAM: LEFT FOOT - COMPLETE 3+ VIEW COMPARISON:  None in PACs FINDINGS: The bones of the left foot are adequately mineralized. The interphalangeal, metatarsophalangeal, and tarsometatarsal joints are normal. The intertarsal joint spaces are preserved where visualized. There is an Achilles region calcaneal spur. There is no significant plantar spurring. The soft tissues of the foot exhibit no abnormal densities or foreign bodies. IMPRESSION: No acute bony abnormality is observed. Certainly plantar fasciitis can be present and have no radiographic findings. If further imaging is felt indicated clinically, MRI would be the most useful next imaging step. Electronically Signed   By: David  Martinique M.D.   On: 05/13/2015 11:13    Assessment & Plan:   There are no diagnoses linked to this encounter. I am having Henry [name] maintain his aspirin, Glucosamine-Chondroit-Vit C-Mn (GLUCOSAMINE CHONDR 1500 COMPLX PO), cholecalciferol, HYDROcodone-acetaminophen, finasteride, and ibuprofen.  No orders of the defined types were placed in this encounter.    Follow-up: No Follow-up on file.  Walker Kehr, MD

## 2016-01-06 NOTE — Assessment & Plan Note (Addendum)
BP Readings from Last 3 Encounters:  01/06/16 (!) 150/90  12/15/15 138/88  12/01/15 (!) 144/80   Pt declined BP meds

## 2016-01-06 NOTE — Progress Notes (Signed)
Pre visit review using our clinic review tool, if applicable. No additional management support is needed unless otherwise documented below in the visit note. 

## 2016-02-18 DIAGNOSIS — M549 Dorsalgia, unspecified: Secondary | ICD-10-CM | POA: Diagnosis not present

## 2016-02-18 DIAGNOSIS — M48062 Spinal stenosis, lumbar region with neurogenic claudication: Secondary | ICD-10-CM | POA: Diagnosis not present

## 2016-03-14 DIAGNOSIS — M5136 Other intervertebral disc degeneration, lumbar region: Secondary | ICD-10-CM | POA: Diagnosis not present

## 2016-03-14 DIAGNOSIS — M4726 Other spondylosis with radiculopathy, lumbar region: Secondary | ICD-10-CM | POA: Diagnosis not present

## 2016-03-14 DIAGNOSIS — M5416 Radiculopathy, lumbar region: Secondary | ICD-10-CM | POA: Diagnosis not present

## 2016-03-14 DIAGNOSIS — M48061 Spinal stenosis, lumbar region without neurogenic claudication: Secondary | ICD-10-CM | POA: Diagnosis not present

## 2016-04-06 DIAGNOSIS — M48062 Spinal stenosis, lumbar region with neurogenic claudication: Secondary | ICD-10-CM | POA: Diagnosis not present

## 2016-04-08 ENCOUNTER — Other Ambulatory Visit (HOSPITAL_COMMUNITY): Payer: Self-pay | Admitting: Neurological Surgery

## 2016-04-08 ENCOUNTER — Other Ambulatory Visit: Payer: Self-pay | Admitting: Neurological Surgery

## 2016-04-08 DIAGNOSIS — M48062 Spinal stenosis, lumbar region with neurogenic claudication: Secondary | ICD-10-CM

## 2016-04-22 ENCOUNTER — Ambulatory Visit (HOSPITAL_COMMUNITY)
Admission: RE | Admit: 2016-04-22 | Discharge: 2016-04-22 | Disposition: A | Discharge status: Home / Self Care | Payer: Medicare HMO | Source: Ambulatory Visit | Attending: Neurological Surgery | Admitting: Neurological Surgery

## 2016-04-22 DIAGNOSIS — M129 Arthropathy, unspecified: Secondary | ICD-10-CM | POA: Diagnosis not present

## 2016-04-22 DIAGNOSIS — M5126 Other intervertebral disc displacement, lumbar region: Secondary | ICD-10-CM | POA: Insufficient documentation

## 2016-04-22 DIAGNOSIS — M48061 Spinal stenosis, lumbar region without neurogenic claudication: Secondary | ICD-10-CM | POA: Insufficient documentation

## 2016-04-22 DIAGNOSIS — M48062 Spinal stenosis, lumbar region with neurogenic claudication: Secondary | ICD-10-CM | POA: Diagnosis present

## 2016-04-22 MED ORDER — ONDANSETRON HCL 4 MG/2ML IJ SOLN: 4.0000 mg | Freq: Four times a day (QID) | INTRAMUSCULAR | Status: DC | PRN

## 2016-04-22 MED ORDER — DIAZEPAM 5 MG PO TABS
10.0000 mg | ORAL_TABLET | Freq: Once | ORAL | Status: AC
  Administered 2016-04-22: 10 mg via ORAL
  Filled 2016-04-22: qty 2

## 2016-04-22 MED ORDER — HYDROCODONE-ACETAMINOPHEN 5-325 MG PO TABS: 1.0000 | ORAL_TABLET | ORAL | Status: DC | PRN

## 2016-04-22 MED ORDER — DIAZEPAM 5 MG PO TABS
ORAL_TABLET | ORAL | Status: AC
  Administered 2016-04-22: 10 mg via ORAL
  Filled 2016-04-22: qty 2

## 2016-04-22 MED ORDER — IOPAMIDOL (ISOVUE-M 200) INJECTION 41%
20.0000 mL | Freq: Once | INTRAMUSCULAR | Status: AC
  Administered 2016-04-22: 12 mL via INTRATHECAL

## 2016-04-22 MED ORDER — LIDOCAINE HCL (PF) 1 % IJ SOLN
5.0000 mL | Freq: Once | INTRAMUSCULAR | Status: AC
  Administered 2016-04-22: 5 mL via INTRADERMAL

## 2016-04-22 NOTE — Discharge Instructions (Signed)
Myelography, Care After °These instructions give you information on caring for yourself after your procedure. Your doctor may also give you more specific instructions. Call your doctor if you have any problems or questions after your procedure. °HOME CARE °· Rest the first day. °· When you rest, lie flat, with your head slightly raised (elevated). °· Avoid heavy lifting and activity for 48 hours, or as told by your doctor. °· You may take the bandage (dressing) off one day after the test, or as told by your doctor. °· Take all medicines only as told by your doctor. °· Ask your doctor when it is okay to take a shower or bath. °· Ask your doctor when your test results will be ready and how you can get them. Make sure you follow up and get your results. °· Do not drink alcohol for 24 hours, or as told by your doctor. °· Drink enough fluid to keep your pee (urine) clear or pale yellow. °GET HELP IF:  °· You have a fever. °· You have a headache. °· You feel sick to your stomach (nauseous) or throw up (vomit). °· You have pain or cramping in your belly (abdomen). °GET HELP RIGHT AWAY IF:  °· You have a headache with a stiff neck or fever. °· You have trouble breathing. °· Any of the places where the needles were put in are: °¨ Puffy (swollen) or red. °¨ Sore or hot to the touch. °¨ Draining yellowish-white fluid (pus). °¨ Bleeding. °MAKE SURE YOU: °· Understand these instructions. °· Will watch your condition. °· Will get help right away if you are not doing well or get worse. °This information is not intended to replace advice given to you by your health care provider. Make sure you discuss any questions you have with your health care provider. °Document Released: 12/01/2007 Document Revised: 03/14/2014 Document Reviewed: 12/04/2014 °Elsevier Interactive Patient Education © 2017 [employer] ° °

## 2016-04-22 NOTE — Procedures (Signed)
Mr. 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 He's been advised that he may ultimately require surgical intervention and in order to better define the degree and nature of his stenosis which appears to be present at L3-4 4551 a myelogram and post myelogram CT scan has been suggested.  Pre op Dx: Lumbar stenosis Post op Dx: Lumbar stenosis Procedure: Lumbar myelogram Surgeon: Azahel Belcastro Puncture level: L4-5 Fluid color: Clear, colorless Injection: Isovue-200, 12 mL Findings: Severe stenosis L3-4 L4-5 L5-S1. Further evaluation with CT scanning.

## 2016-04-27 ENCOUNTER — Other Ambulatory Visit: Payer: Self-pay | Admitting: Neurological Surgery

## 2016-04-27 DIAGNOSIS — M48062 Spinal stenosis, lumbar region with neurogenic claudication: Secondary | ICD-10-CM | POA: Diagnosis not present

## 2016-04-27 DIAGNOSIS — Z6829 Body mass index (BMI) 29.0-29.9, adult: Secondary | ICD-10-CM | POA: Diagnosis not present

## 2016-04-27 DIAGNOSIS — R030 Elevated blood-pressure reading, without diagnosis of hypertension: Secondary | ICD-10-CM | POA: Diagnosis not present

## 2016-05-11 ENCOUNTER — Other Ambulatory Visit (HOSPITAL_COMMUNITY): Payer: Self-pay | Admitting: *Deleted

## 2016-05-11 NOTE — Pre-Procedure Instructions (Addendum)
WINTON OFFORD  05/11/2016    Your procedure is scheduled on Friday, May 20, 2016 at 86 Noon.   Report to [facility] Entrance "A" Admitting Office at 10:00 AM.   Call this number if you have problems the morning of surgery: [phone]   Questions prior to day of surgery, please call [phone] between 8 & 4 PM.   Remember:  Do not eat food or drink liquids after midnight Thursday, 05/19/16.  Stop Aspirin products 5 days prior to surgery. Do not use NSAIDS (Ibuprofen, Aleve, etc) 5 days prior to surgery.     Do not wear jewelry.  Do not wear lotions, powders or cologne.  Men may shave face and neck.  Do not bring valuables to the hospital.  [facility] is not responsible for any belongings or valuables.  Contacts, dentures or bridgework may not be worn into surgery.  Leave your suitcase in the car.  After surgery it may be brought to your room.  For patients admitted to the hospital, discharge time will be determined by your treatment team.  Special instructions:  [place] - Preparing for Surgery  Before surgery, you can play an important role.  Because skin is not sterile, your skin needs to be as free of germs as possible.  You can reduce the number of germs on you skin by washing with CHG (chlorahexidine gluconate) soap before surgery.  CHG is an antiseptic cleaner which kills germs and bonds with the skin to continue killing germs even after washing.  Please DO NOT use if you have an allergy to CHG or antibacterial soaps.  If your skin becomes reddened/irritated stop using the CHG and inform your nurse when you arrive at Short Stay.  Do not shave (including legs and underarms) for at least 48 hours prior to the first CHG shower.  You may shave your face.  Please follow these instructions carefully:   1.  Shower with CHG Soap the night before surgery and the                    morning of Surgery.  2.  If you choose to wash your hair, wash your hair first as  usual with your       normal shampoo.  3.  After you shampoo, rinse your hair and body thoroughly to remove the shampoo.  4.  Use CHG as you would any other liquid soap.  You can apply chg directly       to the skin and wash gently with scrungie or a clean washcloth.  5.  Apply the CHG Soap to your body ONLY FROM THE NECK DOWN.        Do not use on open wounds or open sores.  Avoid contact with your eyes, ears, mouth and genitals (private parts).  Wash genitals (private parts) with your normal soap.  6.  Wash thoroughly, paying special attention to the area where your surgery        will be performed.  7.  Thoroughly rinse your body with warm water from the neck down.  8.  DO NOT shower/wash with your normal soap after using and rinsing off       the CHG Soap.  9.  Pat yourself dry with a clean towel.            10.  Wear clean pajamas.            11.  Place  clean sheets on your bed the night of your first shower and do not        sleep with pets.  Day of Surgery  Do not apply any lotions the morning of surgery.  Please wear clean clothes to the hospital.   Please read over the fact sheets that you were given.

## 2016-05-12 ENCOUNTER — Encounter (HOSPITAL_COMMUNITY)
Admission: RE | Admit: 2016-05-12 | Discharge: 2016-05-12 | Disposition: A | Discharge status: Home / Self Care | Payer: Medicare HMO | Source: Ambulatory Visit | Attending: Neurological Surgery | Admitting: Neurological Surgery

## 2016-05-12 ENCOUNTER — Encounter (HOSPITAL_COMMUNITY): Payer: Self-pay

## 2016-05-12 DIAGNOSIS — M48062 Spinal stenosis, lumbar region with neurogenic claudication: Secondary | ICD-10-CM | POA: Diagnosis not present

## 2016-05-12 DIAGNOSIS — Z01812 Encounter for preprocedural laboratory examination: Secondary | ICD-10-CM | POA: Insufficient documentation

## 2016-05-12 LAB — CBC
HCT: 49.2 % (ref 39.0–52.0)
HEMOGLOBIN: 17.3 g/dL — AB (ref 13.0–17.0)
MCH: 34.1 pg — AB (ref 26.0–34.0)
MCHC: 35.2 g/dL (ref 30.0–36.0)
MCV: 96.9 fL (ref 78.0–100.0)
Platelets: 188 10*3/uL (ref 150–400)
RBC: 5.08 MIL/uL (ref 4.22–5.81)
RDW: 14.9 % (ref 11.5–15.5)
WBC: 7.5 10*3/uL (ref 4.0–10.5)

## 2016-05-12 LAB — BASIC METABOLIC PANEL
ANION GAP: 7 (ref 5–15)
BUN: 15 mg/dL (ref 6–20)
CALCIUM: 9.4 mg/dL (ref 8.9–10.3)
CO2: 28 mmol/L (ref 22–32)
Chloride: 104 mmol/L (ref 101–111)
Creatinine, Ser: 1.02 mg/dL (ref 0.61–1.24)
GFR calc Af Amer: >60 mL/min (ref >60)
GFR calc non Af Amer: >60 mL/min (ref >60)
GLUCOSE: 111 mg/dL — AB (ref 65–99)
POTASSIUM: 4.5 mmol/L (ref 3.5–5.1)
Sodium: 139 mmol/L (ref 135–145)

## 2016-05-12 LAB — SURGICAL PCR SCREEN
MRSA, PCR: NEGATIVE
Staphylococcus aureus: POSITIVE — AB

## 2016-05-12 NOTE — Progress Notes (Signed)
Pt. Denies all chest concerns, he denies ever having any cardiac surveillance. PCP- [name].

## 2016-05-20 ENCOUNTER — Inpatient Hospital Stay (HOSPITAL_COMMUNITY): Payer: Medicare HMO | Admitting: Anesthesiology

## 2016-05-20 ENCOUNTER — Inpatient Hospital Stay (HOSPITAL_COMMUNITY)
Admission: RE | Admit: 2016-05-20 | Discharge: 2016-05-21 | DRG: 517 | Disposition: A | Discharge status: Home / Self Care | Payer: Medicare HMO | Source: Ambulatory Visit | Attending: Neurological Surgery | Admitting: Neurological Surgery

## 2016-05-20 ENCOUNTER — Inpatient Hospital Stay (HOSPITAL_COMMUNITY): Payer: Medicare HMO

## 2016-05-20 ENCOUNTER — Encounter (HOSPITAL_COMMUNITY): Payer: Self-pay | Admitting: Anesthesiology

## 2016-05-20 ENCOUNTER — Inpatient Hospital Stay (HOSPITAL_COMMUNITY)
Admission: RE | Disposition: A | Discharge status: Home / Self Care | Payer: Self-pay | Source: Ambulatory Visit | Attending: Neurological Surgery

## 2016-05-20 DIAGNOSIS — I10 Essential (primary) hypertension: Secondary | ICD-10-CM | POA: Diagnosis not present

## 2016-05-20 DIAGNOSIS — M4807 Spinal stenosis, lumbosacral region: Secondary | ICD-10-CM | POA: Diagnosis not present

## 2016-05-20 DIAGNOSIS — M48061 Spinal stenosis, lumbar region without neurogenic claudication: Secondary | ICD-10-CM | POA: Diagnosis not present

## 2016-05-20 DIAGNOSIS — Z419 Encounter for procedure for purposes other than remedying health state, unspecified: Secondary | ICD-10-CM

## 2016-05-20 DIAGNOSIS — M4726 Other spondylosis with radiculopathy, lumbar region: Secondary | ICD-10-CM | POA: Diagnosis not present

## 2016-05-20 DIAGNOSIS — M48062 Spinal stenosis, lumbar region with neurogenic claudication: Secondary | ICD-10-CM | POA: Diagnosis not present

## 2016-05-20 DIAGNOSIS — M5417 Radiculopathy, lumbosacral region: Secondary | ICD-10-CM | POA: Diagnosis not present

## 2016-05-20 DIAGNOSIS — M5416 Radiculopathy, lumbar region: Secondary | ICD-10-CM | POA: Diagnosis not present

## 2016-05-20 DIAGNOSIS — R05 Cough: Secondary | ICD-10-CM | POA: Diagnosis not present

## 2016-05-20 DIAGNOSIS — M545 Low back pain: Secondary | ICD-10-CM | POA: Diagnosis not present

## 2016-05-20 DIAGNOSIS — K219 Gastro-esophageal reflux disease without esophagitis: Secondary | ICD-10-CM | POA: Diagnosis not present

## 2016-05-20 HISTORY — PX: LUMBAR LAMINECTOMY/DECOMPRESSION MICRODISCECTOMY: SHX5026

## 2016-05-20 SURGERY — LUMBAR LAMINECTOMY/DECOMPRESSION MICRODISCECTOMY 3 LEVELS
Anesthesia: General | Site: Spine Lumbar

## 2016-05-20 MED ORDER — ONDANSETRON HCL 4 MG PO TABS
4.0000 mg | ORAL_TABLET | Freq: Four times a day (QID) | ORAL | Status: DC | PRN
Start: 2016-05-20 — End: 2016-05-21

## 2016-05-20 MED ORDER — SODIUM CHLORIDE 0.9 % IV SOLN: INTRAVENOUS | Status: DC

## 2016-05-20 MED ORDER — HYDROMORPHONE HCL 1 MG/ML IJ SOLN
1.0000 mg | INTRAMUSCULAR | Status: DC | PRN
  Administered 2016-05-20: 1 mg via INTRAVENOUS
  Filled 2016-05-20: qty 1

## 2016-05-20 MED ORDER — CHLORHEXIDINE GLUCONATE CLOTH 2 % EX PADS: 6.0000 | MEDICATED_PAD | Freq: Once | CUTANEOUS | Status: DC

## 2016-05-20 MED ORDER — THROMBIN 5000 UNITS EX SOLR
CUTANEOUS | Status: DC | PRN
  Administered 2016-05-20 (×2): 5000 [IU] via TOPICAL

## 2016-05-20 MED ORDER — SODIUM CHLORIDE 0.9% FLUSH
3.0000 mL | Freq: Two times a day (BID) | INTRAVENOUS | Status: DC
  Administered 2016-05-20: 3 mL via INTRAVENOUS

## 2016-05-20 MED ORDER — DIAZEPAM 5 MG PO TABS: 5.0000 mg | ORAL_TABLET | Freq: Four times a day (QID) | ORAL | 0 refills | Status: DC | PRN

## 2016-05-20 MED ORDER — THROMBIN 5000 UNITS EX SOLR
CUTANEOUS | Status: AC
  Filled 2016-05-20: qty 10000

## 2016-05-20 MED ORDER — FLEET ENEMA 7-19 GM/118ML RE ENEM: 1.0000 | ENEMA | Freq: Once | RECTAL | Status: DC | PRN

## 2016-05-20 MED ORDER — DOCUSATE SODIUM 100 MG PO CAPS
100.0000 mg | ORAL_CAPSULE | Freq: Two times a day (BID) | ORAL | Status: DC
  Administered 2016-05-20 – 2016-05-21 (×2): 100 mg via ORAL
  Filled 2016-05-20 (×2): qty 1

## 2016-05-20 MED ORDER — 0.9 % SODIUM CHLORIDE (POUR BTL) OPTIME
TOPICAL | Status: DC | PRN
  Administered 2016-05-20: 1000 mL

## 2016-05-20 MED ORDER — BUPIVACAINE HCL (PF) 0.5 % IJ SOLN
INTRAMUSCULAR | Status: AC
  Filled 2016-05-20: qty 30

## 2016-05-20 MED ORDER — LACTATED RINGERS IV SOLN
INTRAVENOUS | Status: DC
  Administered 2016-05-20: 50 mL/h via INTRAVENOUS

## 2016-05-20 MED ORDER — PHENYLEPHRINE HCL 10 MG/ML IJ SOLN
INTRAVENOUS | Status: DC | PRN
  Administered 2016-05-20: 10 ug/min via INTRAVENOUS

## 2016-05-20 MED ORDER — ALBUMIN HUMAN 5 % IV SOLN
INTRAVENOUS | Status: DC | PRN
  Administered 2016-05-20: 14:00:00 via INTRAVENOUS

## 2016-05-20 MED ORDER — DEXAMETHASONE 1 MG PO TABS: ORAL_TABLET | ORAL | 0 refills | Status: DC

## 2016-05-20 MED ORDER — SODIUM CHLORIDE 0.9% FLUSH: 3.0000 mL | INTRAVENOUS | Status: DC | PRN

## 2016-05-20 MED ORDER — FENTANYL CITRATE (PF) 100 MCG/2ML IJ SOLN
INTRAMUSCULAR | Status: AC
Start: 2016-05-20 — End: 2016-05-20
  Filled 2016-05-20: qty 4

## 2016-05-20 MED ORDER — CEFAZOLIN SODIUM-DEXTROSE 2-4 GM/100ML-% IV SOLN
INTRAVENOUS | Status: AC
  Filled 2016-05-20: qty 100

## 2016-05-20 MED ORDER — SODIUM CHLORIDE 0.9 % IR SOLN
Status: DC | PRN
  Administered 2016-05-20: 500 mL

## 2016-05-20 MED ORDER — BISACODYL 10 MG RE SUPP: 10.0000 mg | Freq: Every day | RECTAL | Status: DC | PRN

## 2016-05-20 MED ORDER — THROMBIN 5000 UNITS EX SOLR
CUTANEOUS | Status: AC
  Filled 2016-05-20: qty 5000

## 2016-05-20 MED ORDER — CEFAZOLIN SODIUM-DEXTROSE 2-4 GM/100ML-% IV SOLN
2.0000 g | INTRAVENOUS | Status: AC
  Administered 2016-05-20: 2 g via INTRAVENOUS

## 2016-05-20 MED ORDER — DEXAMETHASONE SODIUM PHOSPHATE 4 MG/ML IJ SOLN: 2.0000 mg | Freq: Two times a day (BID) | INTRAMUSCULAR | Status: DC

## 2016-05-20 MED ORDER — EPHEDRINE SULFATE 50 MG/ML IJ SOLN
INTRAMUSCULAR | Status: DC | PRN
Start: 2016-05-20 — End: 2016-05-20
  Administered 2016-05-20: 5 mg via INTRAVENOUS

## 2016-05-20 MED ORDER — HYDROCODONE-ACETAMINOPHEN 5-325 MG PO TABS: 1.0000 | ORAL_TABLET | ORAL | 0 refills | Status: DC | PRN

## 2016-05-20 MED ORDER — ACETAMINOPHEN 325 MG PO TABS: 650.0000 mg | ORAL_TABLET | ORAL | Status: DC | PRN

## 2016-05-20 MED ORDER — DEXAMETHASONE 4 MG PO TABS
2.0000 mg | ORAL_TABLET | Freq: Two times a day (BID) | ORAL | Status: DC
  Administered 2016-05-20 – 2016-05-21 (×2): 2 mg via ORAL
  Filled 2016-05-20 (×2): qty 1

## 2016-05-20 MED ORDER — METHOCARBAMOL 1000 MG/10ML IJ SOLN
500.0000 mg | Freq: Four times a day (QID) | INTRAVENOUS | Status: DC | PRN
  Filled 2016-05-20: qty 5

## 2016-05-20 MED ORDER — PHENOL 1.4 % MT LIQD: 1.0000 | OROMUCOSAL | Status: DC | PRN

## 2016-05-20 MED ORDER — HYDROCODONE-ACETAMINOPHEN 5-325 MG PO TABS
1.0000 | ORAL_TABLET | ORAL | Status: DC | PRN
  Administered 2016-05-20 – 2016-05-21 (×4): 2 via ORAL
  Filled 2016-05-20 (×4): qty 2

## 2016-05-20 MED ORDER — METHOCARBAMOL 500 MG PO TABS
500.0000 mg | ORAL_TABLET | Freq: Four times a day (QID) | ORAL | Status: DC | PRN
  Administered 2016-05-20 – 2016-05-21 (×2): 500 mg via ORAL
  Filled 2016-05-20 (×2): qty 1

## 2016-05-20 MED ORDER — HEMOSTATIC AGENTS (NO CHARGE) OPTIME
TOPICAL | Status: DC | PRN
  Administered 2016-05-20: 1 via TOPICAL

## 2016-05-20 MED ORDER — ROCURONIUM BROMIDE 100 MG/10ML IV SOLN
INTRAVENOUS | Status: DC | PRN
  Administered 2016-05-20: 50 mg via INTRAVENOUS

## 2016-05-20 MED ORDER — LIDOCAINE HCL (CARDIAC) 20 MG/ML IV SOLN
INTRAVENOUS | Status: DC | PRN
  Administered 2016-05-20: 80 mg via INTRATRACHEAL

## 2016-05-20 MED ORDER — ONDANSETRON HCL 4 MG/2ML IJ SOLN
4.0000 mg | Freq: Four times a day (QID) | INTRAMUSCULAR | Status: DC | PRN
Start: 2016-05-20 — End: 2016-05-21

## 2016-05-20 MED ORDER — POLYETHYLENE GLYCOL 3350 17 G PO PACK: 17.0000 g | PACK | Freq: Every day | ORAL | Status: DC | PRN

## 2016-05-20 MED ORDER — MIDAZOLAM HCL 5 MG/5ML IJ SOLN
INTRAMUSCULAR | Status: DC | PRN
  Administered 2016-05-20: 1 mg via INTRAVENOUS

## 2016-05-20 MED ORDER — MENTHOL 3 MG MT LOZG: 1.0000 | LOZENGE | OROMUCOSAL | Status: DC | PRN

## 2016-05-20 MED ORDER — HYDROMORPHONE HCL 1 MG/ML IJ SOLN: 0.2500 mg | INTRAMUSCULAR | Status: DC | PRN

## 2016-05-20 MED ORDER — LIDOCAINE-EPINEPHRINE 2 %-1:100000 IJ SOLN
INTRAMUSCULAR | Status: DC | PRN
  Administered 2016-05-20: 9 mL

## 2016-05-20 MED ORDER — PROMETHAZINE HCL 25 MG/ML IJ SOLN
6.2500 mg | INTRAMUSCULAR | Status: DC | PRN
Start: 2016-05-20 — End: 2016-05-20

## 2016-05-20 MED ORDER — MIDAZOLAM HCL 2 MG/2ML IJ SOLN
INTRAMUSCULAR | Status: AC
  Filled 2016-05-20: qty 2

## 2016-05-20 MED ORDER — SENNA 8.6 MG PO TABS
1.0000 | ORAL_TABLET | Freq: Two times a day (BID) | ORAL | Status: DC
  Administered 2016-05-20 – 2016-05-21 (×2): 8.6 mg via ORAL
  Filled 2016-05-20 (×2): qty 1

## 2016-05-20 MED ORDER — RISAQUAD PO CAPS
1.0000 | ORAL_CAPSULE | Freq: Every day | ORAL | Status: DC
  Administered 2016-05-21: 1 via ORAL
  Filled 2016-05-20: qty 1

## 2016-05-20 MED ORDER — PROPOFOL 10 MG/ML IV BOLUS
INTRAVENOUS | Status: AC
  Filled 2016-05-20: qty 20

## 2016-05-20 MED ORDER — ALUM & MAG HYDROXIDE-SIMETH 200-200-20 MG/5ML PO SUSP: 30.0000 mL | Freq: Four times a day (QID) | ORAL | Status: DC | PRN

## 2016-05-20 MED ORDER — PROPOFOL 10 MG/ML IV BOLUS
INTRAVENOUS | Status: DC | PRN
  Administered 2016-05-20: 110 mg via INTRAVENOUS

## 2016-05-20 MED ORDER — DEXAMETHASONE SODIUM PHOSPHATE 10 MG/ML IJ SOLN
INTRAMUSCULAR | Status: DC | PRN
  Administered 2016-05-20: 10 mg via INTRAVENOUS

## 2016-05-20 MED ORDER — ACETAMINOPHEN 650 MG RE SUPP: 650.0000 mg | RECTAL | Status: DC | PRN

## 2016-05-20 MED ORDER — PHENYLEPHRINE HCL 10 MG/ML IJ SOLN
INTRAMUSCULAR | Status: DC | PRN
  Administered 2016-05-20 (×2): 40 ug via INTRAVENOUS

## 2016-05-20 MED ORDER — LIDOCAINE-EPINEPHRINE 2 %-1:100000 IJ SOLN
INTRAMUSCULAR | Status: AC
  Filled 2016-05-20: qty 1

## 2016-05-20 MED ORDER — THROMBIN 5000 UNITS EX SOLR
OROMUCOSAL | Status: DC | PRN
  Administered 2016-05-20 (×2): 5 mL via TOPICAL

## 2016-05-20 MED ORDER — SUGAMMADEX SODIUM 200 MG/2ML IV SOLN
INTRAVENOUS | Status: DC | PRN
  Administered 2016-05-20: 200 mg via INTRAVENOUS

## 2016-05-20 MED ORDER — THROMBIN 20000 UNITS EX SOLR
CUTANEOUS | Status: AC
  Filled 2016-05-20: qty 40000

## 2016-05-20 MED ORDER — BUPIVACAINE HCL (PF) 0.5 % IJ SOLN
INTRAMUSCULAR | Status: DC | PRN
Start: 2016-05-20 — End: 2016-05-20
  Administered 2016-05-20: 9 mL
  Administered 2016-05-20: 20 mL

## 2016-05-20 MED ORDER — ONDANSETRON HCL 4 MG/2ML IJ SOLN
INTRAMUSCULAR | Status: DC | PRN
  Administered 2016-05-20: 4 mg via INTRAVENOUS

## 2016-05-20 MED ORDER — LACTATED RINGERS IV SOLN
INTRAVENOUS | Status: DC | PRN
  Administered 2016-05-20 (×2): via INTRAVENOUS

## 2016-05-20 MED ORDER — FENTANYL CITRATE (PF) 250 MCG/5ML IJ SOLN
INTRAMUSCULAR | Status: DC | PRN
  Administered 2016-05-20 (×3): 50 ug via INTRAVENOUS

## 2016-05-20 SURGICAL SUPPLY — 56 items
ADH SKN CLS APL DERMABOND .7 (GAUZE/BANDAGES/DRESSINGS) ×1
BAG DECANTER FOR FLEXI CONT (MISCELLANEOUS) ×3 IMPLANT
BLADE CLIPPER SURG (BLADE) IMPLANT
BUR ACORN 6.0 (BURR) ×2 IMPLANT
BUR ACORN 6.0MM (BURR) ×1
BUR MATCHSTICK NEURO 3.0 LAGG (BURR) ×3 IMPLANT
CANISTER SUCT 3000ML PPV (MISCELLANEOUS) ×3 IMPLANT
CARTRIDGE OIL MAESTRO DRILL (MISCELLANEOUS) IMPLANT
DECANTER SPIKE VIAL GLASS SM (MISCELLANEOUS) ×3 IMPLANT
DERMABOND ADVANCED (GAUZE/BANDAGES/DRESSINGS) ×2
DERMABOND ADVANCED .7 DNX12 (GAUZE/BANDAGES/DRESSINGS) ×1 IMPLANT
DEVICE DISSECT PLASMABLAD 3.0S (MISCELLANEOUS) ×1 IMPLANT
DIFFUSER DRILL AIR PNEUMATIC (MISCELLANEOUS) IMPLANT
DRAPE HALF SHEET 40X57 (DRAPES) IMPLANT
DRAPE LAPAROTOMY 100X72X124 (DRAPES) ×3 IMPLANT
DRAPE MICROSCOPE LEICA (MISCELLANEOUS) ×3 IMPLANT
DRAPE POUCH INSTRU U-SHP 10X18 (DRAPES) ×3 IMPLANT
DRSG OPSITE POSTOP 4X6 (GAUZE/BANDAGES/DRESSINGS) ×3 IMPLANT
DURAPREP 26ML APPLICATOR (WOUND CARE) ×3 IMPLANT
ELECT REM PT RETURN 9FT ADLT (ELECTROSURGICAL) ×3
ELECTRODE REM PT RTRN 9FT ADLT (ELECTROSURGICAL) ×1 IMPLANT
GAUZE SPONGE 4X4 12PLY STRL (GAUZE/BANDAGES/DRESSINGS) IMPLANT
GAUZE SPONGE 4X4 16PLY XRAY LF (GAUZE/BANDAGES/DRESSINGS) ×3 IMPLANT
GLOVE BIO SURGEON STRL SZ8 (GLOVE) ×3 IMPLANT
GLOVE BIOGEL PI IND STRL 7.5 (GLOVE) ×2 IMPLANT
GLOVE BIOGEL PI IND STRL 8.5 (GLOVE) ×1 IMPLANT
GLOVE BIOGEL PI INDICATOR 7.5 (GLOVE) ×4
GLOVE BIOGEL PI INDICATOR 8.5 (GLOVE) ×2
GLOVE ECLIPSE 8.5 STRL (GLOVE) ×3 IMPLANT
GLOVE INDICATOR 8.5 STRL (GLOVE) ×3 IMPLANT
GLOVE SURG SS PI 7.0 STRL IVOR (GLOVE) ×9 IMPLANT
GOWN STRL REUS W/ TWL LRG LVL3 (GOWN DISPOSABLE) IMPLANT
GOWN STRL REUS W/ TWL XL LVL3 (GOWN DISPOSABLE) ×3 IMPLANT
GOWN STRL REUS W/TWL 2XL LVL3 (GOWN DISPOSABLE) ×3 IMPLANT
GOWN STRL REUS W/TWL LRG LVL3 (GOWN DISPOSABLE)
GOWN STRL REUS W/TWL XL LVL3 (GOWN DISPOSABLE) ×6
HEMOSTAT POWDER KIT SURGIFOAM (HEMOSTASIS) ×6 IMPLANT
KIT BASIN OR (CUSTOM PROCEDURE TRAY) ×3 IMPLANT
KIT ROOM TURNOVER OR (KITS) ×3 IMPLANT
NEEDLE HYPO 22GX1.5 SAFETY (NEEDLE) ×3 IMPLANT
NEEDLE SPNL 20GX3.5 QUINCKE YW (NEEDLE) ×3 IMPLANT
NS IRRIG 1000ML POUR BTL (IV SOLUTION) ×3 IMPLANT
OIL CARTRIDGE MAESTRO DRILL (MISCELLANEOUS)
PACK LAMINECTOMY NEURO (CUSTOM PROCEDURE TRAY) ×3 IMPLANT
PAD ARMBOARD 7.5X6 YLW CONV (MISCELLANEOUS) ×15 IMPLANT
PATTIES SURGICAL .5 X1 (DISPOSABLE) ×3 IMPLANT
PLASMABLADE 3.0S (MISCELLANEOUS) ×3
RUBBERBAND STERILE (MISCELLANEOUS) IMPLANT
SPONGE SURGIFOAM ABS GEL SZ50 (HEMOSTASIS) ×3 IMPLANT
SUT VIC AB 1 CT1 18XBRD ANBCTR (SUTURE) ×2 IMPLANT
SUT VIC AB 1 CT1 8-18 (SUTURE) ×4
SUT VIC AB 2-0 CP2 18 (SUTURE) ×3 IMPLANT
SUT VIC AB 3-0 SH 8-18 (SUTURE) ×3 IMPLANT
TOWEL GREEN STERILE (TOWEL DISPOSABLE) ×3 IMPLANT
TOWEL GREEN STERILE FF (TOWEL DISPOSABLE) ×2 IMPLANT
WATER STERILE IRR 1000ML POUR (IV SOLUTION) ×3 IMPLANT

## 2016-05-20 NOTE — Discharge Summary (Signed)
Physician Discharge Summary  Patient ID: Henry [name] MRN: [id number] DOB/AGE: 74-03-1940 74 y.o.  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 Radiculopathy. Genic claudication. Active Problems:   Lumbar stenosis with neurogenic claudication   Discharged Condition: good  Hospital Course: Patient was admitted to undergo surgical intervention he tolerated this well his motor function is good.  Consults: None  Significant Diagnostic Studies: None  Treatments: surgery: Bilateral laminotomies and foraminotomies L3-4 L4-5 and L5-S1 decompression of L3 L4 L5 and S1 nerve roots.  Discharge Exam: Blood pressure 138/87, pulse 83, temperature 99.1 F (37.3 C), resp. rate 16, weight 89 kg (196 lb 1.6 oz), SpO2 99 %. Incision/Wound: Incision is clean and dry motor function is intact. Station and gait are intact.  Disposition: Discharge home  Discharge Instructions    Call MD for:  redness, tenderness, or signs of infection (pain, swelling, redness, odor or green/yellow discharge around incision site)    Complete by:  As directed    Call MD for:  severe uncontrolled pain    Complete by:  As directed    Call MD for:  temperature >100.4    Complete by:  As directed    Diet - low sodium heart healthy    Complete by:  As directed    Incentive spirometry RT    Complete by:  As directed    Increase activity slowly    Complete by:  As directed      Allergies as of 05/20/2016      Reactions   Chlorhexidine Gluconate Hives   No Known Allergies       Medication List    TAKE these medications   ALIGN PO Take 1 capsule by mouth daily.   BC FAST PAIN RELIEF 650-195-33.3 MG Pack Generic drug:  Aspirin-Salicylamide-Caffeine Take 1 packet by mouth daily.   calcium carbonate 500 MG chewable  tablet Commonly known as:  TUMS - dosed in mg elemental calcium Chew 1 tablet by mouth daily as needed for indigestion or heartburn.   cholecalciferol 1000 units tablet Commonly known as:  VITAMIN D Take 1,000 Units by mouth daily.   dexamethasone 1 MG tablet Commonly known as:  DECADRON 2 tablets twice daily for 2 days, one tablet twice daily for 2 days, one tablet daily for 2 days.   diazepam 5 MG tablet Commonly known as:  VALIUM Take 1 tablet (5 mg total) by mouth every 6 (six) hours as needed for muscle spasms.   finasteride 5 MG tablet Commonly known as:  PROSCAR Take 1 tablet (5 mg total) by mouth daily.   HYDROcodone-acetaminophen 5-325 MG tablet Commonly known as:  NORCO/VICODIN Take 1-2 tablets by mouth every 4 (four) hours as needed for severe pain.   ibuprofen 200 MG tablet Commonly known as:  ADVIL,MOTRIN Take 200 mg by mouth every 6 (six) hours as needed.        SignedEarleen Newport 05/20/2016, 6:50 PM

## 2016-05-20 NOTE — Transfer of Care (Signed)
Immediate Anesthesia Transfer of Care Note  Patient: Henry [name]  Procedure(s) Performed: Procedure(s): Lumbar Three-Four,Lunmbar Four-Five,Lumbar Five-Sacral One Laminectomy/Foraminotomy (N/A)  Patient Location: PACU  Anesthesia Type:General  Level of Consciousness: awake and confused patient agitated at first but calmer after fentanyl   Airway & Oxygen Therapy: Patient Spontanous Breathing and Patient connected to nasal cannula oxygen  Post-op Assessment: Report given to RN and Post -op Vital signs reviewed and stable  Post vital signs: Reviewed and stable  Last Vitals:  Vitals:   05/20/16 1022 05/20/16 1535  BP: (!) 186/97 (!) 126/99  Pulse: 98 90  Resp: 20 18  Temp: 37.2 C 36.6 C    Last Pain:  Vitals:   05/20/16 1022  TempSrc: Oral  PainSc: 4       Patients Stated Pain Goal: 3 (37/90/24 0973)  Complications: No apparent anesthesia complications

## 2016-05-20 NOTE — H&P (Signed)
CHIEF COMPLAINT:                                          Pain and numbness in his legs and a limited capacity to walk.  HISTORY OF PRESENT ILLNESS:                     Mr. Barg is a 76 year old, right-handed individual who tells me he has been having problems for five years or so.  He notes that he has been seen by Dr. Walker Kehr and back in 2013 he had an MRI of the lumbar spine.  He was given some pain medication and he has been dealing with this problem, but he notes that his capacity to walk a distance and his degree of pain in his lower extremities has been getting progressively worse.  He notes that he does not have pain in his back so much as he does in his buttocks and hips and he finds that his limited capacity is becoming increasingly problematic for him.  He has good control of his bowels and bladder and he feels in general that he has good strength, but he finds that walking a distance, as much as 75 yards or so, will cause aggravation of pain such that he has to stop.  He has not had any other intervention up until this point.    IMAGING STUDIES:                                          He brings with him that MRI, which I had the opportunity to review today.  Plain x-rays of the lumbar spine were also performed in the office today and these demonstrate that he has moderate degenerative changes in the disks at L3-4, L4-5, and L5-S1.  He maintains good alignment between flexion and extension and in the coronal plane he has good alignment also.  The MRI demonstrates that the patient has a moderate degree of stenosis at L3-4, L4-5, and L5-S1 with subarticular stenosis most notably at L4-5.  REVIEW OF SYSTEMS:                                    Systems review is notable for balance disturbance, leg pain while walking, leg weakness, back pain, leg pain, and difficulty starting and stopping urinary stream on a 14-point review sheet noted in the office today.   PAST MEDICAL HISTORY:                                 His past medical history reveals that he has some mild hypertension/  . Medications and Allergies:  Current medications include Finasteride and Hydrocodone as needed for pain.  PHYSICAL EXAMINATION:                                On physical examination I note that he stands straight and erect.  He will tend to favor a slight forward stoop when he walks, but his motor strength appears good in the iliopsoas, quadriceps, tibialis anterior, and the gastrocs.  His deep tendon reflexes are absent in the patellae and the Achilles both and straight leg raising is negative at 15 degrees in either lower extremity.  It become positive at 30 to 45 degrees in both lower extremities.  Patrick maneuver is negative.  IMPRESSION:                                                   Mr. 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 A recent myelogram demonstrates that he has significant stenosis at 3 levels, L3-4 L4-5 and L5S1. He has failed efforts at conservative management including PT alteration of activity and a number of epi dural injections. Surgery via bilateral laminotomies at L3-4  L4-5 and L5S1 has been advised.

## 2016-05-20 NOTE — Progress Notes (Signed)
1050 am paged Dr.Elsner to let him know of patients reaction (hives)  To chlorohexadine.  Patient has reddended area to left arm , chest, upper back.  Patient denies any itching or sob.  Spoke with OR REGARDING  Hives and she stated to have Dr. Ellene Route look at it.

## 2016-05-20 NOTE — Anesthesia Preprocedure Evaluation (Addendum)
Anesthesia Evaluation  Patient identified by MRN, date of birth, ID band Patient awake    Reviewed: Allergy & Precautions, NPO status , Patient's Chart, lab work & pertinent test results  History of Anesthesia Complications Negative for: history of anesthetic complications  Airway Mallampati: I  TM Distance: >3 FB Neck ROM: Full    Dental  (+) Teeth Intact, Dental Advisory Given, Caps   Pulmonary former smoker,    Pulmonary exam normal        Cardiovascular hypertension, Normal cardiovascular exam     Neuro/Psych negative psych ROS   GI/Hepatic Neg liver ROS, GERD  ,  Endo/Other  negative endocrine ROS  Renal/GU negative Renal ROS     Musculoskeletal   Abdominal   Peds  Hematology   Anesthesia Other Findings   Reproductive/Obstetrics                           Anesthesia Physical Anesthesia Plan  ASA: II  Anesthesia Plan: General   Post-op Pain Management:    Induction: Intravenous  Airway Management Planned: Oral ETT  Additional Equipment:   Intra-op Plan:   Post-operative Plan: Extubation in OR  Informed Consent: I have reviewed the patients History and Physical, chart, labs and discussed the procedure including the risks, benefits and alternatives for the proposed anesthesia with the patient or authorized representative who has indicated his/her understanding and acceptance.   Dental advisory given  Plan Discussed with: CRNA, Anesthesiologist and Surgeon  Anesthesia Plan Comments:        Anesthesia Quick Evaluation

## 2016-05-20 NOTE — Anesthesia Procedure Notes (Signed)
Procedure Name: Intubation Date/Time: 05/20/2016 1:11 PM Performed by: Maude Leriche D Pre-anesthesia Checklist: Patient identified, Emergency Drugs available, Suction available, Patient being monitored and Timeout performed Patient Re-evaluated:Patient Re-evaluated prior to inductionOxygen Delivery Method: Circle system utilized Preoxygenation: Pre-oxygenation with 100% oxygen Intubation Type: IV induction Ventilation: Mask ventilation without difficulty and Oral airway inserted - appropriate to patient size Laryngoscope Size: Miller and 2 Grade View: Grade I Tube type: Oral Tube size: 7.5 mm Number of attempts: 1 Airway Equipment and Method: Stylet Placement Confirmation: ETT inserted through vocal cords under direct vision,  positive ETCO2 and breath sounds checked- equal and bilateral Secured at: 22 cm Tube secured with: Tape Dental Injury: Teeth and Oropharynx as per pre-operative assessment

## 2016-05-20 NOTE — Op Note (Signed)
Date of surgery: 05/20/2016 Preoperative diagnosis: Lumbar stenosis with neurogenic claudication, lumbar radiculopathy. L3-4 L4-5 L5-S1. Postoperative diagnosis: Same Procedure: Bilateral laminotomies and foraminotomies L3-4 L4-5 and L5-S1. Decompression of L3 L4 L5 and S1 nerve roots. Surgeon: Kristeen Miss First assistant: Francesca Jewett M.D. Anesthesia: Gen. endotracheal Indications: Henry [name] is a 76 year old individual who has had significant back and bilateral lower extremity pain. He has advanced spondylitic stenosis at multiple levels including L3-4 L4-5 and L5-S1. He's been advised regarding surgical decompression of each of these levels via bilateral laminotomies at each level. He's now taken to the operating room for this procedure.  Procedure: The patient was brought to the operating room supine on a stretcher. After the smooth induction of general endotracheal anesthesia, he was turned prone. The back was prepped with alcohol DuraPrep and draped in a sterile fashion. A midline incision was created and carried down to the lumbar dorsal fascia. The fascia was opened on either side of midline first spinous process was noted to be that of L4 on a localizing radiograph. Then a subperiosteal dissection was carried out over the region of the facet joints at L3-4 L4-5 and L5-S1. Soft tissues were removed from these areas and markedly overgrown facets were encountered each level. There is evidence of external cystic masses from the facets themselves and this was resected. Then laminotomies were created removing the inferior margin lamina of L3 out to and including the mesial portion of the facet joint. A partial mesial facetectomy was performed. Thick and redundant yellow ligament was taken up using a combination of to 3 and 4 mm Kerrison punches and laminotomy was created identifying common dural tube. The on the lateral aspect the path of the L4 nerve root was uncovered and this area was decompressed  using a 2 mm Kerrison punch and some curved curettes. Superiorly the path of the L3 nerve root was uncovered and this area could be sounded after using a series of curved Kerrison punches an effort to decompress this of significant redundant grumous material. Once this was decompressed on both sides attention was turned to L4-5 are similar decompression was undertaken here there was marked hypertrophy and overgrowth of the superior articular process for L5 that butted into the L4 nerve root foramen. This was partially removed allowing for decompression the L4 nerve root superiorly and then the L5 nerve root inferiorly was decompressed with a foraminotomy. Attention was then turned to L5-S1 similar decompression was carried out again noting substantial hypertrophy of the superior radicular process of the sacrum in these regions. Once area was adequately decompress the central canal and the pads of the L to L3-L4 and L5 nerve roots were checked hemostasis and the soft tissues was obtained and the wound was assessed carefully was noted that the spinous process of L5 fracture and it was removed. The wound was then closed with #1 Vicryl in interrupted fashion and the lumbar dorsal fascia 2-0 Vicryl in the subcutaneous anus tissues 3-0 Vicryl subcuticularly. Dermabond was placed on the skin. Blood loss is estimated at 150 mL. Patient tolerated procedure well was returned to recovery room in stable condition.

## 2016-05-20 NOTE — Anesthesia Postprocedure Evaluation (Addendum)
Anesthesia Post Note  Patient: Henry [name]  Procedure(s) Performed: Procedure(s) (LRB): Lumbar Three-Four,Lunmbar Four-Five,Lumbar Five-Sacral One Laminectomy/Foraminotomy (N/A)  Patient location during evaluation: PACU Anesthesia Type: General Level of consciousness: sedated Pain management: pain level controlled Vital Signs Assessment: post-procedure vital signs reviewed and stable Respiratory status: spontaneous breathing and respiratory function stable Cardiovascular status: stable Anesthetic complications: no       Last Vitals:  Vitals:   05/20/16 1610 05/20/16 1620  BP: (!) 143/94 140/86  Pulse: 85 80  Resp: 14 19  Temp:      Last Pain:  Vitals:   05/20/16 1620  TempSrc:   PainSc: Asleep                 Ashana Tullo DANIEL

## 2016-05-20 NOTE — Progress Notes (Signed)
Patient ID: Henry [name], male   DOB: Mar 24, 1940, 76 y.o.   MRN: [id number] Vital signs are stable Motor function appears good in lower extremities Patient ambulates and voids he may be discharged in the morning

## 2016-05-21 ENCOUNTER — Encounter (HOSPITAL_COMMUNITY): Payer: Self-pay | Admitting: Neurological Surgery

## 2016-05-21 NOTE — Care Management Note (Signed)
Case Management Note  Patient Details  Name: Henry [name] MRN: [id number] Date of Birth: 1940-07-29  Subjective/Objective:                  Radiculopathy Action/Plan: Discharge planning Expected Discharge Date:  05/21/16               Expected Discharge Plan:  Home/Self Care  In-House Referral:     Discharge planning Services  CM Consult  Post Acute Care Choice:  NA Choice offered to:  NA  DME Arranged:  N/A DME Agency:  NA  HH Arranged:  NA HH Agency:  NA  Status of Service:  Completed, signed off  If discussed at [place] of Stay Meetings, dates discussed:    Additional Comments: CM notes no home health services nor DME has been ordered nor recommended.  No other CM needs were communicated. Dellie Catholic, RN 05/21/2016, 9:44 AM

## 2016-05-21 NOTE — Evaluation (Signed)
Occupational Therapy Evaluation/Discharge Patient Details Name: Henry [name] MRN: [id number] DOB: 12/28/1940 Today's Date: 05/21/2016    History of Present Illness 76 yo admitted for lami L3-S1. PMH significant for: mild hypertension.   Clinical Impression   PTA, pt was independent with ADL and functional mobility. He currently requires overall supervision for ADL to ensure safety and adherence to back precautions. Educated pt and wife concerning back precautions during ADL including dressing/bathing techniques, setting timer for pain medication reminder, use of 2 cups for oral care at sink, use of wet wipes for toileting hygiene, and safe shower transfers. They verbalize and demonstrate understanding and report no further questions/concerns. No further acute OT needs identified. OT will sign off.      Follow Up Recommendations  No OT follow up    Equipment Recommendations  None recommended by OT    Recommendations for Other Services       Precautions / Restrictions Precautions Precautions: Back Precaution Booklet Issued: Yes (comment) Precaution Comments: Reviewed precautions during ADL Restrictions Weight Bearing Restrictions: No      Mobility Bed Mobility Overal bed mobility: Modified Independent             General bed mobility comments: OOB in chair on arrival. Able to verbalize technique.  Transfers Overall transfer level: Needs assistance   Transfers: Sit to/from Stand Sit to Stand: Supervision         General transfer comment: Good carryover of postural cues from PT.    Balance Overall balance assessment: No apparent balance deficits (not formally assessed)                                          ADL Overall ADL's : Needs assistance/impaired     Grooming: Supervision/safety;Standing   Upper Body Bathing: Set up;Sitting   Lower Body Bathing: Supervison/ safety;Sit to/from stand   Upper Body Dressing : Set up;Sitting    Lower Body Dressing: Supervision/safety;Sit to/from stand   Toilet Transfer: Supervision/safety;Ambulation;Regular Toilet   Toileting- Water quality scientist and Hygiene: Supervision/safety;Sit to/from stand   Tub/ Shower Transfer: Supervision/safety;Walk-in shower;Ambulation;3 in 1   Functional mobility during ADLs: Supervision/safety General ADL Comments: Educated pt and wife concerning dressing/bathing techniques, setting timer for pain medication reminder, use of 2 cups for oral care at sink, use of wet wipes for toileting hygiene, and safe shower transfers.      Vision Patient Visual Report: No change from baseline Vision Assessment?: No apparent visual deficits     Perception     Praxis      Pertinent Vitals/Pain Pain Assessment: Faces Pain Score: 2  Faces Pain Scale: Hurts a little bit Pain Location: incision Pain Descriptors / Indicators: Operative site guarding Pain Intervention(s): Monitored during session;Repositioned     Hand Dominance Right   Extremity/Trunk Assessment Upper Extremity Assessment Upper Extremity Assessment: Overall WFL for tasks assessed   Lower Extremity Assessment Lower Extremity Assessment: Overall WFL for tasks assessed   Cervical / Trunk Assessment Cervical / Trunk Assessment: Other exceptions Cervical / Trunk Exceptions: post surgical   Communication Communication Communication: No difficulties   Cognition Arousal/Alertness: Awake/alert Behavior During Therapy: WFL for tasks assessed/performed Overall Cognitive Status: Within Functional Limits for tasks assessed                     General Comments       Exercises  Shoulder Instructions      Home Living Family/patient expects to be discharged to:: Private residence Living Arrangements: Spouse/significant other Available Help at Discharge: Family;Available 24 hours/day Type of Home: House Home Access: Stairs to enter [employer] of Steps: 2    Home Layout: One level     Bathroom Shower/Tub: Occupational psychologist: Standard     Home Equipment: None          Prior Functioning/Environment Level of Independence: Independent                 OT Problem List: Decreased activity tolerance;Decreased knowledge of use of DME or AE;Decreased knowledge of precautions;Pain      OT Treatment/Interventions:      OT Goals(Current goals can be found in the care plan section) Acute Rehab OT Goals Patient Stated Goal: to go home today OT Goal Formulation: With patient/family Time For Goal Achievement: 05/28/16 Potential to Achieve Goals: Good  OT Frequency:     Barriers to D/C:            Co-evaluation              End of Session Nurse Communication: Mobility status  Activity Tolerance: Patient tolerated treatment well Patient left: in chair;with call bell/phone within reach;with family/visitor present  OT Visit Diagnosis: Unsteadiness on feet (R26.81)                ADL either performed or assessed with clinical judgement  Time: 0853-0902 OT Time Calculation (min): 9 min Charges:  OT General Charges $OT Visit: 1 Procedure OT Evaluation $OT Eval Low Complexity: 1 Procedure G-Codes: OT G-codes **NOT FOR INPATIENT CLASS** Functional Assessment Tool Used: AM-PAC 6 Clicks Daily Activity Functional Limitation: Self care Self Care Current Status (U9811): At least 20 percent but less than 40 percent impaired, limited or restricted Self Care Goal Status (B1478): At least 20 percent but less than 40 percent impaired, limited or restricted Self Care Discharge Status [phone]): At least 20 percent but less than 40 percent impaired, limited or restricted   Norman Herrlich, [place] OTR/L  Pager: [place] 05/21/2016, 9:43 AM

## 2016-05-21 NOTE — Evaluation (Addendum)
Physical Therapy Evaluation/ Discharge Patient Details Name: Henry [name] MRN: [id number] DOB: 1940-10-24 Today's Date: 05/21/2016   History of Present Illness  76 yo admitted for lami L3-S1.  Clinical Impression  Pt very pleasant and jovial throughout session. 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 Pt and wife educated for all precautions, transfers, positioning, gait and functional activity with pt able to verbalize and demonstrate understanding. Pt with incisional pain but otherwise pain free and WFL for strength and function. Pt without further therapy needs at this time with pt aware and agreeable will sign off.     Follow Up Recommendations No PT follow up    Equipment Recommendations  None recommended by PT    Recommendations for Other Services       Precautions / Restrictions Precautions Precautions: Back Precaution Booklet Issued: Yes (comment)      Mobility  Bed Mobility Overal bed mobility: Modified Independent                Transfers Overall transfer level: Needs assistance   Transfers: Sit to/from Stand Sit to Stand: Supervision         General transfer comment: Pt given cues for upright posture with transfers and able to demonstrate  Ambulation/Gait Ambulation/Gait assistance: Independent Ambulation Distance (Feet): 400 Feet Assistive device: None Gait Pattern/deviations: WFL(Within Functional Limits)   Gait velocity interpretation: at or above normal speed for age/gender    Stairs Stairs: Yes Stairs assistance: Independent Stair Management: No rails;Alternating pattern;Forwards Number of Stairs: 3    Wheelchair Mobility    Modified Rankin (Stroke Patients Only)       Balance Overall balance assessment: No apparent balance deficits (not formally assessed)                                           Pertinent Vitals/Pain Pain Assessment: 0-10 Pain Score: 2  Pain  Location: incision Pain Descriptors / Indicators: Operative site guarding Pain Intervention(s): Limited activity within patient's tolerance    Home Living Family/patient expects to be discharged to:: Private residence Living Arrangements: Spouse/significant other Available Help at Discharge: Family;Available 24 hours/day Type of Home: House Home Access: Stairs to enter   [employer] of Steps: 2 Home Layout: One level Home Equipment: None      Prior Function Level of Independence: Independent               Hand Dominance        Extremity/Trunk Assessment   Upper Extremity Assessment Upper Extremity Assessment: Overall WFL for tasks assessed    Lower Extremity Assessment Lower Extremity Assessment: Overall WFL for tasks assessed    Cervical / Trunk Assessment Cervical / Trunk Assessment: Other exceptions Cervical / Trunk Exceptions: post surgical  Communication   Communication: No difficulties  Cognition Arousal/Alertness: Awake/alert Behavior During Therapy: WFL for tasks assessed/performed Overall Cognitive Status: Within Functional Limits for tasks assessed                      General Comments      Exercises     Assessment/Plan    PT Assessment Patent does not need any further PT services  PT Problem List         PT Treatment Interventions      PT Goals (Current goals can be found in the Care Plan  section)  Acute Rehab PT Goals PT Goal Formulation: All assessment and education complete, DC therapy    Frequency     Barriers to discharge        Co-evaluation               End of Session   Activity Tolerance: Patient tolerated treatment well Patient left: in chair;with call bell/phone within reach;with family/visitor present Nurse Communication: Mobility status;Precautions PT Visit Diagnosis: Pain    Functional Assessment Tool Used: AM-PAC 6 Clicks Basic Mobility Functional Limitation: Mobility: Walking and  moving around Mobility: Walking and Moving Around Current Status (R1735): At least 1 percent but less than 20 percent impaired, limited or restricted Mobility: Walking and Moving Around Goal Status [phone]): At least 1 percent but less than 20 percent impaired, limited or restricted Mobility: Walking and Moving Around Discharge Status [phone]): At least 1 percent but less than 20 percent impaired, limited or restricted    Time: 0831-0848 PT Time Calculation (min) (ACUTE ONLY): 17 min   Charges:   PT Evaluation $PT Eval Moderate Complexity: 1 Procedure     PT G Codes:   PT G-Codes **NOT FOR INPATIENT CLASS** Functional Assessment Tool Used: AM-PAC 6 Clicks Basic Mobility Functional Limitation: Mobility: Walking and moving around Mobility: Walking and Moving Around Current Status (T1438): At least 1 percent but less than 20 percent impaired, limited or restricted Mobility: Walking and Moving Around Goal Status [phone]): At least 1 percent but less than 20 percent impaired, limited or restricted Mobility: Walking and Moving Around Discharge Status [phone]): At least 1 percent but less than 20 percent impaired, limited or restricted     Marilin Kofman B  [place] 05/21/2016, 9:15 AM  Elwyn Reach, [place]

## 2016-05-21 NOTE — Progress Notes (Signed)
Patient alert and oriented, mae's well, voiding adequate amount of urine, swallowing without difficulty, no c/o pain at time of discharge. Patient discharged home with family. Script and discharged instructions given to patient. Patient and family stated understanding of instructions given. Patient has an appointment with Dr. Elsner  

## 2016-05-23 MED FILL — Thrombin For Soln 5000 Unit: CUTANEOUS | Qty: 5000 | Status: AC

## 2016-06-10 DIAGNOSIS — L300 Nummular dermatitis: Secondary | ICD-10-CM | POA: Diagnosis not present

## 2016-06-10 DIAGNOSIS — L299 Pruritus, unspecified: Secondary | ICD-10-CM | POA: Diagnosis not present

## 2016-06-24 DIAGNOSIS — B029 Zoster without complications: Secondary | ICD-10-CM | POA: Diagnosis not present

## 2016-07-04 ENCOUNTER — Encounter: Payer: Self-pay | Admitting: Internal Medicine

## 2016-07-04 ENCOUNTER — Ambulatory Visit (INDEPENDENT_AMBULATORY_CARE_PROVIDER_SITE_OTHER): Payer: Medicare HMO | Admitting: Internal Medicine

## 2016-07-04 DIAGNOSIS — G8929 Other chronic pain: Secondary | ICD-10-CM | POA: Diagnosis not present

## 2016-07-04 DIAGNOSIS — B0229 Other postherpetic nervous system involvement: Secondary | ICD-10-CM | POA: Insufficient documentation

## 2016-07-04 DIAGNOSIS — M5441 Lumbago with sciatica, right side: Secondary | ICD-10-CM

## 2016-07-04 DIAGNOSIS — R0789 Other chest pain: Secondary | ICD-10-CM | POA: Diagnosis not present

## 2016-07-04 DIAGNOSIS — M5442 Lumbago with sciatica, left side: Secondary | ICD-10-CM | POA: Diagnosis not present

## 2016-07-04 MED ORDER — ZOSTER VAC RECOMB ADJUVANTED 50 MCG/0.5ML IM SUSR: 0.5000 mL | Freq: Once | INTRAMUSCULAR | 1 refills | Status: AC

## 2016-07-04 MED ORDER — GABAPENTIN 100 MG PO CAPS: 100.0000 mg | ORAL_CAPSULE | Freq: Three times a day (TID) | ORAL | 3 refills | Status: DC

## 2016-07-04 MED ORDER — VALACYCLOVIR HCL 1 G PO TABS: 1000.0000 mg | ORAL_TABLET | Freq: Three times a day (TID) | ORAL | 0 refills | Status: DC

## 2016-07-04 NOTE — Assessment & Plan Note (Signed)
  Gabapentin Valtrex Shingrix Rx

## 2016-07-04 NOTE — Progress Notes (Signed)
Subjective:  Patient ID: Henry [name], male    DOB: Mar 15, 1940  Age: 76 y.o. MRN: [id number]  CC: No chief complaint on file.   HPI Henry [name] presents for painful rash on L arm x 6 wks ago. He saw a dermatologist 1 wk ago - took Predn and topical Triamc. C/o pain in the chest and L arm; burning  Outpatient Medications Prior to Visit  Medication Sig Dispense Refill  . cholecalciferol (VITAMIN D) 1000 units tablet Take 1,000 Units by mouth daily.    [name] ibuprofen (ADVIL,MOTRIN) 200 MG tablet Take 200 mg by mouth every 6 (six) hours as needed.    . Probiotic Product (ALIGN PO) Take 1 capsule by mouth daily.    . Aspirin-Salicylamide-Caffeine (BC FAST PAIN RELIEF) 650-195-33.3 MG PACK Take 1 packet by mouth daily.    . calcium carbonate (TUMS - DOSED IN MG ELEMENTAL CALCIUM) 500 MG chewable tablet Chew 1 tablet by mouth daily as needed for indigestion or heartburn.    . dexamethasone (DECADRON) 1 MG tablet 2 tablets twice daily for 2 days, one tablet twice daily for 2 days, one tablet daily for 2 days. 15 tablet 0  . diazepam (VALIUM) 5 MG tablet Take 1 tablet (5 mg total) by mouth every 6 (six) hours as needed for muscle spasms. 40 tablet 0  . finasteride (PROSCAR) 5 MG tablet Take 1 tablet (5 mg total) by mouth daily. (Patient not taking: Reported on 04/20/2016) 30 tablet 11  . HYDROcodone-acetaminophen (NORCO/VICODIN) 5-325 MG tablet Take 1-2 tablets by mouth every 4 (four) hours as needed for severe pain. 60 tablet 0   No facility-administered medications prior to visit.     ROS Review of Systems  Constitutional: Negative for appetite change, fatigue and unexpected weight change.  HENT: Negative for congestion, nosebleeds, sneezing, sore throat and trouble swallowing.   Eyes: Negative for itching and visual disturbance.  Respiratory: Negative for cough.   Cardiovascular: Positive for chest pain. Negative for palpitations and leg swelling.  Gastrointestinal: Negative for  abdominal distention, blood in stool, diarrhea and nausea.  Genitourinary: Negative for frequency and hematuria.  Musculoskeletal: Negative for back pain, gait problem, joint swelling and neck pain.  Skin: Positive for rash.  Neurological: Negative for dizziness, tremors, speech difficulty and weakness.  Psychiatric/Behavioral: Negative for agitation, dysphoric mood and sleep disturbance. The patient is not nervous/anxious.     Objective:  BP (!) 150/96 (BP Location: Left Arm, Patient Position: Sitting, Cuff Size: Normal)   Pulse 75   Temp 98.2 F (36.8 C) (Oral)   Ht 5\' 8"  (1.727 m)   Wt 190 lb (86.2 kg)   SpO2 99%   BMI 28.89 kg/m   BP Readings from Last 3 Encounters:  07/04/16 (!) 150/96  05/21/16 116/78  05/12/16 (!) 158/87    Wt Readings from Last 3 Encounters:  07/04/16 190 lb (86.2 kg)  05/20/16 196 lb 1.6 oz (89 kg)  05/12/16 196 lb 1.6 oz (89 kg)    Physical Exam  Constitutional: He is oriented to person, place, and time. He appears well-developed. No distress.  NAD  HENT:  Mouth/Throat: Oropharynx is clear and moist.  Eyes: Conjunctivae are normal. Pupils are equal, round, and reactive to light.  Neck: Normal range of motion. No JVD present. No thyromegaly present.  Cardiovascular: Normal rate, regular rhythm, normal heart sounds and intact distal pulses.  Exam reveals no gallop and no friction rub.   No murmur heard. Pulmonary/Chest: Effort normal and  breath sounds normal. No respiratory distress. He has no wheezes. He has no rales. He exhibits no tenderness.  Abdominal: Soft. Bowel sounds are normal. He exhibits no distension and no mass. There is no tenderness. There is no rebound and no guarding.  Musculoskeletal: Normal range of motion. He exhibits no edema or tenderness.  Lymphadenopathy:    He has no cervical adenopathy.  Neurological: He is alert and oriented to person, place, and time. He has normal reflexes. No cranial nerve deficit. He exhibits normal  muscle tone. He displays a negative Romberg sign. Coordination and gait normal.  Skin: Skin is warm and dry. Rash noted.  Psychiatric: He has a normal mood and affect. His behavior is normal. Judgment and thought content normal.  L axilla, chest and L upper arm, L upper back scars from rash  Lab Results  Component Value Date   WBC 7.5 05/12/2016   HGB 17.3 (H) 05/12/2016   HCT 49.2 05/12/2016   PLT 188 05/12/2016   GLUCOSE 111 (H) 05/12/2016   CHOL 225 (H) 04/14/2015   TRIG 278.0 (H) 04/14/2015   HDL 41.80 04/14/2015   LDLDIRECT 121.0 04/14/2015   ALT 20 04/14/2015   AST 20 04/14/2015   NA 139 05/12/2016   K 4.5 05/12/2016   CL 104 05/12/2016   CREATININE 1.02 05/12/2016   BUN 15 05/12/2016   CO2 28 05/12/2016   TSH 1.29 04/14/2015   PSA 1.11 04/14/2015    No results found.  Assessment & Plan:   There are no diagnoses linked to this encounter. I have discontinued Mr. Bitter finasteride, Aspirin-Salicylamide-Caffeine, calcium carbonate, dexamethasone, HYDROcodone-acetaminophen, and diazepam. I am also having him maintain his cholecalciferol, ibuprofen, and Probiotic Product (ALIGN PO).  No orders of the defined types were placed in this encounter.    Follow-up: No Follow-up on file.  Walker Kehr, MD

## 2016-07-04 NOTE — Progress Notes (Signed)
Pre visit review using our clinic review tool, if applicable. No additional management support is needed unless otherwise documented below in the visit note. 

## 2016-07-04 NOTE — Assessment & Plan Note (Signed)
L chest and arm pain Gabapentin Valtrex Shingrix Rx

## 2016-07-04 NOTE — Patient Instructions (Addendum)
[facility] well w/Henry [name]   Postherpetic Neuralgia Postherpetic neuralgia (PHN) is nerve pain that occurs after a shingles infection. Shingles is a painful rash that appears on one side of the body, usually on your trunk or face. Shingles is caused by the varicella-zoster virus. This is the same virus that causes chickenpox. In people who have had chickenpox, the virus can resurface years later and cause shingles. You may have PHN if you continue to have pain for 3 months after your shingles rash has gone away. PHN appears in the same area where you had the shingles rash. For most people, PHN goes away within 1 year. Getting a vaccination for shingles can prevent PHN. This vaccine is recommended for people older than 50. It may prevent shingles and may also lower your risk of PHN if you do get shingles. What are the causes? PHN is caused by damage to your nerves from the varicella-zoster virus. This damage makes your nerves overly sensitive. What increases the risk? Aging is the biggest risk factor for developing PHN. Most people who get PHN are older than 1. Other risk factors include:  Having very bad pain before your shingles rash starts.  Having a very bad rash.  Having shingles in the nerve that supplies your face and eye (trigeminal nerve). What are the signs or symptoms? Pain is the main symptom of PHN. The pain is often very bad and may be described as stabbing, burning, or feeling like an electric shock. The pain may come and go or may be there all the time. Pain may be triggered by light touches on the skin or changes in temperature. You may have itching along with the pain. How is this diagnosed? Your health care provider may diagnose PHN based on your symptoms and your history of shingles. Lab studies and other diagnostic tests are usually not needed. How is this treated? There is no cure for PHN. Treatment for PHN will focus on pain relief. Over-the-counter pain relievers do not usually  relieve PHN pain. You may need to work with a pain specialist. Treatment may include:  Antidepressant medicines to help with pain and improve sleep.  Antiseizure medicines to relieve nerve pain.  Strong pain relievers (opioids).  A numbing patch worn on the skin (lidocaine patch). Follow these instructions at home: It may take a long time to recover from PHN. Work closely with your health care provider, and have a good support system at home.  Take all medicines as directed by your health care provider.  Wear loose, comfortable clothing.  Cover sensitive areas with a dressing to reduce friction from clothing rubbing on the area.  If cold does not make your pain worse, try applying a cool compress or cooling gel pack to the area.  Talk to your health care provider if you feel depressed or desperate. Living with long-term pain can be depressing. Contact a health care provider if:  Your medicine is not helping.  You are struggling to manage your pain at home. This information is not intended to replace advice given to you by your health care provider. Make sure you discuss any questions you have with your health care provider. Document Released: 05/14/2002 Document Revised: 07/30/2015 Document Reviewed: 02/12/2013 Elsevier Interactive Patient Education  2017 [employer].

## 2016-07-04 NOTE — Assessment & Plan Note (Signed)
Better post-op 

## 2016-08-03 ENCOUNTER — Ambulatory Visit: Payer: Medicare HMO | Admitting: Internal Medicine

## 2016-08-06 NOTE — Addendum Note (Signed)
Addendum  created 08/06/16 1056 by Duane Boston, MD   Sign clinical note

## 2017-02-24 ENCOUNTER — Ambulatory Visit: Payer: Medicare HMO | Admitting: Internal Medicine

## 2017-02-24 ENCOUNTER — Other Ambulatory Visit (INDEPENDENT_AMBULATORY_CARE_PROVIDER_SITE_OTHER): Payer: Medicare HMO

## 2017-02-24 ENCOUNTER — Encounter: Payer: Self-pay | Admitting: Internal Medicine

## 2017-02-24 VITALS — BP 120/82 | HR 83 | Temp 98.2°F | Wt 184.0 lb

## 2017-02-24 DIAGNOSIS — M5441 Lumbago with sciatica, right side: Secondary | ICD-10-CM

## 2017-02-24 DIAGNOSIS — Z0000 Encounter for general adult medical examination without abnormal findings: Secondary | ICD-10-CM | POA: Diagnosis not present

## 2017-02-24 DIAGNOSIS — I10 Essential (primary) hypertension: Secondary | ICD-10-CM | POA: Diagnosis not present

## 2017-02-24 DIAGNOSIS — D751 Secondary polycythemia: Secondary | ICD-10-CM

## 2017-02-24 DIAGNOSIS — K4091 Unilateral inguinal hernia, without obstruction or gangrene, recurrent: Secondary | ICD-10-CM | POA: Diagnosis not present

## 2017-02-24 DIAGNOSIS — G8929 Other chronic pain: Secondary | ICD-10-CM

## 2017-02-24 DIAGNOSIS — Z23 Encounter for immunization: Secondary | ICD-10-CM | POA: Diagnosis not present

## 2017-02-24 DIAGNOSIS — M5442 Lumbago with sciatica, left side: Secondary | ICD-10-CM

## 2017-02-24 DIAGNOSIS — Z86010 Personal history of colonic polyps: Secondary | ICD-10-CM

## 2017-02-24 DIAGNOSIS — D582 Other hemoglobinopathies: Secondary | ICD-10-CM

## 2017-02-24 LAB — CBC WITH DIFFERENTIAL/PLATELET
BASOS PCT: 1.2 % (ref 0.0–3.0)
Basophils Absolute: 0.1 10*3/uL (ref 0.0–0.1)
EOS PCT: 7.5 % — AB (ref 0.0–5.0)
Eosinophils Absolute: 0.6 10*3/uL (ref 0.0–0.7)
HCT: 51.2 % (ref 39.0–52.0)
Hemoglobin: 17.4 g/dL — ABNORMAL HIGH (ref 13.0–17.0)
LYMPHS ABS: 2.4 10*3/uL (ref 0.7–4.0)
Lymphocytes Relative: 31.6 % (ref 12.0–46.0)
MCHC: 33.9 g/dL (ref 30.0–36.0)
MCV: 98.5 fl (ref 78.0–100.0)
MONO ABS: 1.3 10*3/uL — AB (ref 0.1–1.0)
MONOS PCT: 17.1 % — AB (ref 3.0–12.0)
NEUTROS PCT: 42.6 % — AB (ref 43.0–77.0)
Neutro Abs: 3.2 10*3/uL (ref 1.4–7.7)
Platelets: 224 10*3/uL (ref 150.0–400.0)
RBC: 5.2 Mil/uL (ref 4.22–5.81)
RDW: 15.4 % (ref 11.5–15.5)
WBC: 7.6 10*3/uL (ref 4.0–10.5)

## 2017-02-24 LAB — URINALYSIS
Bilirubin Urine: NEGATIVE
HGB URINE DIPSTICK: NEGATIVE
Ketones, ur: NEGATIVE
Leukocytes, UA: NEGATIVE
NITRITE: NEGATIVE
PH: 7 (ref 5.0–8.0)
Specific Gravity, Urine: 1.015 (ref 1.000–1.030)
TOTAL PROTEIN, URINE-UPE24: NEGATIVE
URINE GLUCOSE: NEGATIVE
Urobilinogen, UA: 0.2 (ref 0.0–1.0)

## 2017-02-24 LAB — LIPID PANEL
CHOLESTEROL: 222 mg/dL — AB (ref 0–200)
HDL: 39.9 mg/dL (ref >39.00)
NonHDL: 181.93
TRIGLYCERIDES: 309 mg/dL — AB (ref 0.0–149.0)
Total CHOL/HDL Ratio: 6
VLDL: 61.8 mg/dL — ABNORMAL HIGH (ref 0.0–40.0)

## 2017-02-24 LAB — HEPATIC FUNCTION PANEL
ALBUMIN: 4.4 g/dL (ref 3.5–5.2)
ALK PHOS: 77 U/L (ref 39–117)
ALT: 13 U/L (ref 0–53)
AST: 17 U/L (ref 0–37)
Bilirubin, Direct: 0.1 mg/dL (ref 0.0–0.3)
TOTAL PROTEIN: 7.2 g/dL (ref 6.0–8.3)
Total Bilirubin: 0.6 mg/dL (ref 0.2–1.2)

## 2017-02-24 LAB — BASIC METABOLIC PANEL
BUN: 13 mg/dL (ref 6–23)
CALCIUM: 9.3 mg/dL (ref 8.4–10.5)
CO2: 31 mEq/L (ref 19–32)
CREATININE: 1 mg/dL (ref 0.40–1.50)
Chloride: 99 mEq/L (ref 96–112)
GFR: 77.45 mL/min (ref >60.00)
Glucose, Bld: 103 mg/dL — ABNORMAL HIGH (ref 70–99)
Potassium: 4.2 mEq/L (ref 3.5–5.1)
SODIUM: 139 meq/L (ref 135–145)

## 2017-02-24 LAB — PSA: PSA: 1.75 ng/mL (ref 0.10–4.00)

## 2017-02-24 LAB — LDL CHOLESTEROL, DIRECT: LDL DIRECT: 135 mg/dL

## 2017-02-24 LAB — TSH: TSH: 1.88 u[IU]/mL (ref 0.35–4.50)

## 2017-02-24 NOTE — Assessment & Plan Note (Signed)
Per Dr Carlean Purl

## 2017-02-24 NOTE — Addendum Note (Signed)
Addended by: Karle Barr on: 02/24/2017 09:06 AM   Modules accepted: Orders

## 2017-02-24 NOTE — Progress Notes (Signed)
Subjective:  Patient ID: Henry [name], male    DOB: 16-Mar-1940  Age: 76 y.o. MRN: [id number]  CC: No chief complaint on file.   HPI Henry [name] presents for a well exam C/o L ing hernia x 2-3 wks F/u LBP Lost wt on diet  Outpatient Medications Prior to Visit  Medication Sig Dispense Refill  . acetaminophen (TYLENOL) 325 MG tablet Take 650 mg by mouth every 6 (six) hours as needed (as needed for mild pain).    . cholecalciferol (VITAMIN D) 1000 units tablet Take 1,000 Units by mouth daily.    . Probiotic Product (ALIGN PO) Take 1 capsule by mouth daily.    Henry [name] gabapentin (NEURONTIN) 100 MG capsule Take 1-2 capsules (100-200 mg total) by mouth 3 (three) times daily. (Patient not taking: Reported on 02/24/2017) 100 capsule 3  . ibuprofen (ADVIL,MOTRIN) 200 MG tablet Take 200 mg by mouth every 6 (six) hours as needed.    . valACYclovir (VALTREX) 1000 MG tablet Take 1 tablet (1,000 mg total) by mouth 3 (three) times daily. (Patient not taking: Reported on 02/24/2017) 21 tablet 0   No facility-administered medications prior to visit.     ROS Review of Systems  Constitutional: Negative for appetite change, fatigue and unexpected weight change.  HENT: Negative for congestion, nosebleeds, sneezing, sore throat and trouble swallowing.   Eyes: Negative for itching and visual disturbance.  Respiratory: Negative for cough.   Cardiovascular: Negative for chest pain, palpitations and leg swelling.  Gastrointestinal: Positive for abdominal pain. Negative for abdominal distention, blood in stool, diarrhea and nausea.  Genitourinary: Negative for frequency and hematuria.  Musculoskeletal: Negative for back pain, gait problem, joint swelling and neck pain.  Skin: Negative for rash.  Neurological: Negative for dizziness, tremors, speech difficulty and weakness.  Psychiatric/Behavioral: Negative for agitation, decreased concentration, dysphoric mood, sleep disturbance and suicidal ideas. The  patient is not nervous/anxious.     Objective:  BP 120/82   Pulse 83   Temp 98.2 F (36.8 C)   Wt 184 lb (83.5 kg)   SpO2 98%   BMI 27.98 kg/m   BP Readings from Last 3 Encounters:  02/24/17 120/82  07/04/16 (!) 150/96  05/21/16 116/78    Wt Readings from Last 3 Encounters:  02/24/17 184 lb (83.5 kg)  07/04/16 190 lb (86.2 kg)  05/20/16 196 lb 1.6 oz (89 kg)    Physical Exam  Constitutional: He is oriented to person, place, and time. He appears well-developed and well-nourished. No distress.  NAD  HENT:  Head: Normocephalic and atraumatic.  Right Ear: External ear normal.  Left Ear: External ear normal.  Nose: Nose normal.  Mouth/Throat: Oropharynx is clear and moist. No oropharyngeal exudate.  Eyes: Conjunctivae and EOM are normal. Pupils are equal, round, and reactive to light. Right eye exhibits no discharge. Left eye exhibits no discharge. No scleral icterus.  Neck: Normal range of motion. Neck supple. No JVD present. No tracheal deviation present. No thyromegaly present.  Cardiovascular: Normal rate, regular rhythm, normal heart sounds and intact distal pulses. Exam reveals no gallop and no friction rub.  No murmur heard. Pulmonary/Chest: Effort normal and breath sounds normal. No stridor. No respiratory distress. He has no wheezes. He has no rales. He exhibits no tenderness.  Abdominal: Soft. Bowel sounds are normal. He exhibits mass. He exhibits no distension. There is no tenderness. There is no rebound and no guarding.  Genitourinary: Rectum normal, prostate normal and penis normal. Rectal exam shows guaiac  negative stool. No penile tenderness.  Musculoskeletal: Normal range of motion. He exhibits no edema or tenderness.  Lymphadenopathy:    He has no cervical adenopathy.  Neurological: He is alert and oriented to person, place, and time. He has normal reflexes. No cranial nerve deficit. He exhibits normal muscle tone. He displays a negative Romberg sign.  Coordination and gait normal.  Skin: Skin is warm and dry. No rash noted. He is not diaphoretic. No erythema. No pallor.  Psychiatric: He has a normal mood and affect. His behavior is normal. Judgment and thought content normal.  L ing hernia - orange size prostate 1+  Lab Results  Component Value Date   WBC 7.5 05/12/2016   HGB 17.3 (H) 05/12/2016   HCT 49.2 05/12/2016   PLT 188 05/12/2016   GLUCOSE 111 (H) 05/12/2016   CHOL 225 (H) 04/14/2015   TRIG 278.0 (H) 04/14/2015   HDL 41.80 04/14/2015   LDLDIRECT 121.0 04/14/2015   ALT 20 04/14/2015   AST 20 04/14/2015   NA 139 05/12/2016   K 4.5 05/12/2016   CL 104 05/12/2016   CREATININE 1.02 05/12/2016   BUN 15 05/12/2016   CO2 28 05/12/2016   TSH 1.29 04/14/2015   PSA 1.11 04/14/2015    No results found.  Assessment & Plan:   There are no diagnoses linked to this encounter. I am having Henry [name] maintain his cholecalciferol, ibuprofen, Probiotic Product (ALIGN PO), valACYclovir, gabapentin, and acetaminophen.  No orders of the defined types were placed in this encounter.    Follow-up: No Follow-up on file.  Henry Kehr, MD

## 2017-02-24 NOTE — Assessment & Plan Note (Signed)
CBC

## 2017-02-24 NOTE — Assessment & Plan Note (Signed)
Much better post-op °

## 2017-02-24 NOTE — Assessment & Plan Note (Signed)
Nl BP at home 

## 2017-02-24 NOTE — Assessment & Plan Note (Signed)

## 2017-02-24 NOTE — Assessment & Plan Note (Signed)
1/16, 12/18 L side - "orange" size Will ref to Dr Greer Pickerel

## 2017-03-23 ENCOUNTER — Telehealth: Payer: Self-pay | Admitting: Internal Medicine

## 2017-03-23 NOTE — Telephone Encounter (Signed)
Referral has been refaxed and pt's wife has been informed

## 2017-03-23 NOTE — Telephone Encounter (Signed)
Copied from [place]. Topic: Referral - Status >> Mar 23, 2017 10:42 AM Robina Ade, Helene Kelp D wrote: Reason for CRM: Patient wife called and said that they spoke with [facility] about patients referral. They were told that they never did received notes on patient. She would like for the office to fax notes to them [phone] and he has an appt on 04/05/17 @ 10:30am.

## 2017-04-05 DIAGNOSIS — K4091 Unilateral inguinal hernia, without obstruction or gangrene, recurrent: Secondary | ICD-10-CM | POA: Diagnosis not present

## 2017-04-05 DIAGNOSIS — R351 Nocturia: Secondary | ICD-10-CM | POA: Diagnosis not present

## 2017-04-17 DIAGNOSIS — K4091 Unilateral inguinal hernia, without obstruction or gangrene, recurrent: Secondary | ICD-10-CM | POA: Diagnosis not present

## 2017-10-17 DIAGNOSIS — M4726 Other spondylosis with radiculopathy, lumbar region: Secondary | ICD-10-CM | POA: Diagnosis not present

## 2017-10-17 DIAGNOSIS — M9903 Segmental and somatic dysfunction of lumbar region: Secondary | ICD-10-CM | POA: Diagnosis not present

## 2017-10-17 DIAGNOSIS — M5432 Sciatica, left side: Secondary | ICD-10-CM | POA: Diagnosis not present

## 2017-10-17 DIAGNOSIS — R69 Illness, unspecified: Secondary | ICD-10-CM | POA: Diagnosis not present

## 2017-10-17 DIAGNOSIS — M9904 Segmental and somatic dysfunction of sacral region: Secondary | ICD-10-CM | POA: Diagnosis not present

## 2017-10-17 DIAGNOSIS — M9905 Segmental and somatic dysfunction of pelvic region: Secondary | ICD-10-CM | POA: Diagnosis not present

## 2017-10-17 DIAGNOSIS — M4306 Spondylolysis, lumbar region: Secondary | ICD-10-CM | POA: Diagnosis not present

## 2017-10-17 DIAGNOSIS — M5388 Other specified dorsopathies, sacral and sacrococcygeal region: Secondary | ICD-10-CM | POA: Diagnosis not present

## 2017-10-18 DIAGNOSIS — M545 Low back pain: Secondary | ICD-10-CM | POA: Diagnosis not present

## 2017-10-18 DIAGNOSIS — M25552 Pain in left hip: Secondary | ICD-10-CM | POA: Diagnosis not present

## 2017-10-24 DIAGNOSIS — M9903 Segmental and somatic dysfunction of lumbar region: Secondary | ICD-10-CM | POA: Diagnosis not present

## 2017-10-24 DIAGNOSIS — M9905 Segmental and somatic dysfunction of pelvic region: Secondary | ICD-10-CM | POA: Diagnosis not present

## 2017-10-24 DIAGNOSIS — R69 Illness, unspecified: Secondary | ICD-10-CM | POA: Diagnosis not present

## 2017-10-24 DIAGNOSIS — M4306 Spondylolysis, lumbar region: Secondary | ICD-10-CM | POA: Diagnosis not present

## 2017-10-24 DIAGNOSIS — M4726 Other spondylosis with radiculopathy, lumbar region: Secondary | ICD-10-CM | POA: Diagnosis not present

## 2017-10-24 DIAGNOSIS — M9904 Segmental and somatic dysfunction of sacral region: Secondary | ICD-10-CM | POA: Diagnosis not present

## 2017-10-24 DIAGNOSIS — M5432 Sciatica, left side: Secondary | ICD-10-CM | POA: Diagnosis not present

## 2017-10-24 DIAGNOSIS — M5388 Other specified dorsopathies, sacral and sacrococcygeal region: Secondary | ICD-10-CM | POA: Diagnosis not present

## 2017-10-27 DIAGNOSIS — M9903 Segmental and somatic dysfunction of lumbar region: Secondary | ICD-10-CM | POA: Diagnosis not present

## 2017-10-27 DIAGNOSIS — M9904 Segmental and somatic dysfunction of sacral region: Secondary | ICD-10-CM | POA: Diagnosis not present

## 2017-10-27 DIAGNOSIS — M5388 Other specified dorsopathies, sacral and sacrococcygeal region: Secondary | ICD-10-CM | POA: Diagnosis not present

## 2017-10-27 DIAGNOSIS — M42 Spinal osteochondrosis: Secondary | ICD-10-CM | POA: Diagnosis not present

## 2017-10-27 DIAGNOSIS — M9905 Segmental and somatic dysfunction of pelvic region: Secondary | ICD-10-CM | POA: Diagnosis not present

## 2017-10-27 DIAGNOSIS — M4726 Other spondylosis with radiculopathy, lumbar region: Secondary | ICD-10-CM | POA: Diagnosis not present

## 2017-10-30 DIAGNOSIS — M5388 Other specified dorsopathies, sacral and sacrococcygeal region: Secondary | ICD-10-CM | POA: Diagnosis not present

## 2017-10-30 DIAGNOSIS — M9903 Segmental and somatic dysfunction of lumbar region: Secondary | ICD-10-CM | POA: Diagnosis not present

## 2017-10-30 DIAGNOSIS — M4726 Other spondylosis with radiculopathy, lumbar region: Secondary | ICD-10-CM | POA: Diagnosis not present

## 2017-10-30 DIAGNOSIS — M9904 Segmental and somatic dysfunction of sacral region: Secondary | ICD-10-CM | POA: Diagnosis not present

## 2017-10-30 DIAGNOSIS — M42 Spinal osteochondrosis: Secondary | ICD-10-CM | POA: Diagnosis not present

## 2017-10-30 DIAGNOSIS — M9905 Segmental and somatic dysfunction of pelvic region: Secondary | ICD-10-CM | POA: Diagnosis not present

## 2017-11-27 DIAGNOSIS — M9904 Segmental and somatic dysfunction of sacral region: Secondary | ICD-10-CM | POA: Diagnosis not present

## 2017-11-27 DIAGNOSIS — M9905 Segmental and somatic dysfunction of pelvic region: Secondary | ICD-10-CM | POA: Diagnosis not present

## 2017-11-27 DIAGNOSIS — M4306 Spondylolysis, lumbar region: Secondary | ICD-10-CM | POA: Diagnosis not present

## 2017-11-27 DIAGNOSIS — M9903 Segmental and somatic dysfunction of lumbar region: Secondary | ICD-10-CM | POA: Diagnosis not present

## 2017-11-27 DIAGNOSIS — M5388 Other specified dorsopathies, sacral and sacrococcygeal region: Secondary | ICD-10-CM | POA: Diagnosis not present

## 2017-11-27 DIAGNOSIS — M5432 Sciatica, left side: Secondary | ICD-10-CM | POA: Diagnosis not present

## 2017-12-04 DIAGNOSIS — M5432 Sciatica, left side: Secondary | ICD-10-CM | POA: Diagnosis not present

## 2017-12-04 DIAGNOSIS — M9903 Segmental and somatic dysfunction of lumbar region: Secondary | ICD-10-CM | POA: Diagnosis not present

## 2017-12-04 DIAGNOSIS — M4306 Spondylolysis, lumbar region: Secondary | ICD-10-CM | POA: Diagnosis not present

## 2017-12-04 DIAGNOSIS — M9904 Segmental and somatic dysfunction of sacral region: Secondary | ICD-10-CM | POA: Diagnosis not present

## 2017-12-04 DIAGNOSIS — M5388 Other specified dorsopathies, sacral and sacrococcygeal region: Secondary | ICD-10-CM | POA: Diagnosis not present

## 2017-12-04 DIAGNOSIS — M9905 Segmental and somatic dysfunction of pelvic region: Secondary | ICD-10-CM | POA: Diagnosis not present

## 2017-12-21 DIAGNOSIS — M9905 Segmental and somatic dysfunction of pelvic region: Secondary | ICD-10-CM | POA: Diagnosis not present

## 2017-12-21 DIAGNOSIS — M5432 Sciatica, left side: Secondary | ICD-10-CM | POA: Diagnosis not present

## 2017-12-21 DIAGNOSIS — M5388 Other specified dorsopathies, sacral and sacrococcygeal region: Secondary | ICD-10-CM | POA: Diagnosis not present

## 2017-12-21 DIAGNOSIS — M9903 Segmental and somatic dysfunction of lumbar region: Secondary | ICD-10-CM | POA: Diagnosis not present

## 2017-12-21 DIAGNOSIS — M9904 Segmental and somatic dysfunction of sacral region: Secondary | ICD-10-CM | POA: Diagnosis not present

## 2017-12-21 DIAGNOSIS — M4306 Spondylolysis, lumbar region: Secondary | ICD-10-CM | POA: Diagnosis not present

## 2018-01-23 DIAGNOSIS — M9904 Segmental and somatic dysfunction of sacral region: Secondary | ICD-10-CM | POA: Diagnosis not present

## 2018-01-23 DIAGNOSIS — M5388 Other specified dorsopathies, sacral and sacrococcygeal region: Secondary | ICD-10-CM | POA: Diagnosis not present

## 2018-01-23 DIAGNOSIS — M4306 Spondylolysis, lumbar region: Secondary | ICD-10-CM | POA: Diagnosis not present

## 2018-01-23 DIAGNOSIS — M9903 Segmental and somatic dysfunction of lumbar region: Secondary | ICD-10-CM | POA: Diagnosis not present

## 2018-01-23 DIAGNOSIS — M9905 Segmental and somatic dysfunction of pelvic region: Secondary | ICD-10-CM | POA: Diagnosis not present

## 2018-01-23 DIAGNOSIS — M5432 Sciatica, left side: Secondary | ICD-10-CM | POA: Diagnosis not present

## 2018-02-20 DIAGNOSIS — L821 Other seborrheic keratosis: Secondary | ICD-10-CM | POA: Diagnosis not present

## 2018-02-20 DIAGNOSIS — L570 Actinic keratosis: Secondary | ICD-10-CM | POA: Diagnosis not present

## 2018-02-20 DIAGNOSIS — L578 Other skin changes due to chronic exposure to nonionizing radiation: Secondary | ICD-10-CM | POA: Diagnosis not present

## 2018-02-23 NOTE — Progress Notes (Addendum)
Subjective:   Henry [name] is a 77 y.o. male who presents for Medicare Annual/Subsequent preventive examination.  Review of Systems:  No ROS.  Medicare Wellness Visit. Additional risk factors are reflected in the social history.  Cardiac Risk Factors include: advanced age (>64men, >51 women);dyslipidemia;male gender;hypertension Sleep patterns: feels rested on waking, gets up 1-2 times nightly to void and sleeps 7-8 hours nightly.    Home Safety/Smoke Alarms: Feels safe in home. Smoke alarms in place.  Living environment; residence and Firearm Safety: 1-story house/ trailer, no firearms. Lives with wife, no needs for DME, good support system Seat Belt Safety/Bike Helmet: Wears seat belt.   PSA-  Lab Results  Component Value Date   PSA 1.75 02/24/2017   PSA 1.11 04/14/2015   PSA 1.09 01/10/2014       Objective:    Vitals: BP 138/82   Pulse 71   Resp 17   Ht 5\' 8"  (1.727 m)   Wt 186 lb (84.4 kg)   SpO2 98%   BMI 28.28 kg/m   Body mass index is 28.28 kg/m.  Advanced Directives 02/26/2018 05/12/2016 05/18/2015  Does Patient Have a Medical Advance Directive? Yes Yes No  Type of Paramedic of [place];Living will [place];Living will -  Copy of [place] in Chart? No - copy requested - -    Tobacco Social History   Tobacco Use  Smoking Status Former Smoker  . Packs/day: 1.00  . Types: Cigarettes  . Last attempt to quit: 05/24/1976  . Years since quitting: 41.7  Smokeless Tobacco Never Used     Counseling given: Not Answered  Past Medical History:  Diagnosis Date  . Elevated MCV 2011  . GERD (gastroesophageal reflux disease) 2012  . 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 Osteoarthritis   . Polycythemia 2011   Past Surgical History:  Procedure Laterality Date  . APPENDECTOMY    . COLONOSCOPY    . INGUINAL HERNIA REPAIR Left 2015  . LUMBAR  LAMINECTOMY/DECOMPRESSION MICRODISCECTOMY N/A 05/20/2016   Procedure: Lumbar Three-Four,L[place]mbar Four-Five,Lumbar Five-Sacral One Laminectomy/Foraminotomy;  Surgeon: Kristeen Miss, MD;  Location: [facility];  Service: Neurosurgery;  Laterality: N/A;   Family History  Problem Relation Age of Onset  . Colon cancer Sister 26  . Breast cancer Other        1st degree relative  . Stroke Neg Hx   . Esophageal cancer Neg Hx   . Pancreatic cancer Neg Hx   . Prostate cancer Neg Hx   . Rectal cancer Neg Hx   . Stomach cancer Neg Hx    Social History   Socioeconomic History  . Marital status: Married    Spouse name: Not on file  . Number of children: 3  . Years of education: Not on file  . Highest education level: Not on file  Occupational History  . Occupation: Retired    Fish farm manager: RETIRED  Social Needs  . Financial resource strain: Not hard at all  . Food insecurity:    Worry: Never true    Inability: Never true  . Transportation needs:    Medical: No    Non-medical: No  Tobacco Use  . Smoking status: Former Smoker    Packs/day: 1.00    Types: Cigarettes    Last attempt to quit: 05/24/1976    Years since quitting: 41.7  . Smokeless tobacco: Never Used  Substance  and Sexual Activity  . Alcohol use: No  . Drug use: No  . Sexual activity: Not Currently  Lifestyle  . Physical activity:    Days per week: 7 days    Minutes per session: 40 min  . Stress: Not at all  Relationships  . Social connections:    Talks on phone: More than three times a week    Gets together: More than three times a week    Attends religious service: More than 4 times per year    Active member of club or organization: Yes    Attends meetings of clubs or organizations: More than 4 times per year    Relationship status: Married  Other Topics Concern  . Not on file  Social History Narrative  . Not on file    Outpatient Encounter Medications as of 02/26/2018  Medication Sig  . cholecalciferol (VITAMIN D)  1000 units tablet Take 1,000 Units by mouth daily.  . Misc Natural Products (PROSTATE HEALTH) CAPS Take 1 capsule by mouth daily.  . Multiple Vitamins-Minerals (SUPER MEGA VITE 75/BETA CARO PO) Take 1 tablet by mouth 2 (two) times daily.  . naproxen sodium (ALEVE) 220 MG tablet Take 220 mg by mouth as needed.  . Probiotic Product (ALIGN PO) Take 1 capsule by mouth daily.  . [DISCONTINUED] acetaminophen (TYLENOL) 325 MG tablet Take 650 mg by mouth every 6 (six) hours as needed (as needed for mild pain).  . [DISCONTINUED] gabapentin (NEURONTIN) 100 MG capsule Take 1-2 capsules (100-200 mg total) by mouth 3 (three) times daily. (Patient not taking: Reported on 02/24/2017)  . [DISCONTINUED] ibuprofen (ADVIL,MOTRIN) 200 MG tablet Take 200 mg by mouth every 6 (six) hours as needed.  . [DISCONTINUED] valACYclovir (VALTREX) 1000 MG tablet Take 1 tablet (1,000 mg total) by mouth 3 (three) times daily. (Patient not taking: Reported on 02/24/2017)   No facility-administered encounter medications on file as of 02/26/2018.     Activities of Daily Living In your present state of health, do you have any difficulty performing the following activities: 02/26/2018  Hearing? N  Vision? N  Difficulty concentrating or making decisions? N  Walking or climbing stairs? N  Dressing or bathing? N  Doing errands, shopping? N  Preparing Food and eating ? N  Using the Toilet? N  In the past six months, have you accidently leaked urine? N  Do you have problems with loss of bowel control? N  Managing your Medications? N  Managing your Finances? N  Housekeeping or managing your Housekeeping? N  Some recent data might be hidden    Patient Care Team: [name], [name], MD as PCP - General Carlean Purl Ofilia Neas, MD (Gastroenterology) Kristeen Miss, MD as Consulting Physician (Neurosurgery)   Assessment:   This is a routine wellness examination for Henry [name]. Physical assessment deferred to PCP.   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 eats a variety of fruits and vegetables daily, limits salt, fat/cholesterol, sugar,carbohydrates,caffeine.   Reviewed heart healthy diet.   Goals    . Patient Stated     Stay active and enjoy maintaining my home.        Fall Risk Fall Risk  02/26/2018 02/24/2017 04/14/2015  Falls in the past year? 1 No No  Number falls in past yr: 1 - -  Injury with Fall? 1 - -    Depression Screen PHQ 2/9 Scores 02/26/2018 02/24/2017 04/14/2015  PHQ - 2 Score 0 0 0    Cognitive Function MMSE - Mini Mental State Exam 02/26/2018  Orientation to time 5  Orientation to Place 5  Registration 3  Attention/ Calculation 5  Recall 2  Language- name 2 objects 2  Language- repeat 1  Language- follow 3 step command 3  Language- read & follow direction 1  Write a sentence 1  Copy design 1  Total score 29        Immunization History  Administered Date(s) Administered  . Influenza, High Dose Seasonal PF 12/01/2015, 02/24/2017, 02/26/2018  . Influenza,inj,Quad PF,6+ Mos 01/10/2014  . Pneumococcal Polysaccharide-23 07/01/2009  . Td 07/01/2009   Screening Tests Health Maintenance  Topic Date Due  . PNA vac Low Risk Adult (2 of 2 - PCV13) 02/27/2019 (Originally 07/02/2010)  . COLONOSCOPY  06/08/2018  . TETANUS/TDAP  07/02/2019  . INFLUENZA VACCINE  Completed      Plan:     Continue doing brain stimulating activities (puzzles, reading, adult coloring books, staying active) to keep memory sharp.   Continue to eat heart healthy diet (full of fruits, vegetables, whole  grains, lean protein, water--limit salt, fat, and sugar intake) and increase physical activity as tolerated.  I have personally reviewed and noted the following in the patient's chart:   . Medical and social history . Use of alcohol, tobacco or illicit drugs  . Current medications and supplements . Functional ability and status . Nutritional status . Physical activity . Advanced directives . List of other physicians . Vitals . Screenings to include cognitive, depression, and falls . Referrals and appointments  In addition, I have reviewed and discussed with patient certain preventive protocols, quality metrics, and best practice recommendations. A written personalized care plan for preventive services as well as general preventive health recommendations were provided to patient.     Michiel Cowboy, RN  02/26/2018  Medical screening examination/treatment/procedure(s) were performed by non-physician practitioner and as supervising physician I was immediately available for consultation/collaboration. I agree with above. Lew Dawes, MD

## 2018-02-26 ENCOUNTER — Ambulatory Visit (INDEPENDENT_AMBULATORY_CARE_PROVIDER_SITE_OTHER): Payer: Medicare HMO | Admitting: *Deleted

## 2018-02-26 VITALS — BP 138/82 | HR 71 | Resp 17 | Ht 68.0 in | Wt 186.0 lb

## 2018-02-26 DIAGNOSIS — Z0000 Encounter for general adult medical examination without abnormal findings: Secondary | ICD-10-CM | POA: Diagnosis not present

## 2018-02-26 DIAGNOSIS — Z23 Encounter for immunization: Secondary | ICD-10-CM

## 2018-02-26 NOTE — Patient Instructions (Addendum)
Continue doing brain stimulating activities (puzzles, reading, adult coloring books, staying active) to keep memory sharp.   Continue to eat heart healthy diet (full of fruits, vegetables, whole grains, lean protein, water--limit salt, fat, and sugar intake) and increase physical activity as tolerated.   Henry [name] , Thank you for taking time to come for your Medicare Wellness Visit. I appreciate your ongoing commitment to your health goals. Please review the following plan we discussed and let me know if I can assist you in the future.   These are the goals we discussed: Goals    . Patient Stated     Stay active and enjoy maintaining my home.        This is a list of the screening recommended for you and due dates:  Health Maintenance  Topic Date Due  . Pneumonia vaccines (2 of 2 - PCV13) 07/02/2010  . Flu Shot  10/05/2017  . Colon Cancer Screening  06/08/2018  . Tetanus Vaccine  07/02/2019   Influenza Virus Vaccine injection What is this medicine? INFLUENZA VIRUS VACCINE (in floo EN zuh VAHY ruhs vak SEEN) helps to reduce the risk of getting influenza also known as the flu. The vaccine only helps protect you against some strains of the flu. This medicine may be used for other purposes; ask your health care provider or pharmacist if you have questions. COMMON BRAND NAME(S): Afluria, Agriflu, Alfuria, FLUAD, Fluarix, Fluarix Quadrivalent, Flublok, Flublok Quadrivalent, FLUCELVAX, Flulaval, Fluvirin, Fluzone, Fluzone High-Dose, Fluzone Intradermal What should I tell my health care provider before I take this medicine? They need to know if you have any of these conditions: -bleeding disorder like hemophilia -fever or infection -Guillain-Barre syndrome or other neurological problems -immune system problems -infection with the human immunodeficiency virus (HIV) or AIDS -low blood platelet counts -multiple sclerosis -an unusual or allergic reaction to influenza virus vaccine, latex,  other medicines, foods, dyes, or preservatives. Different brands of vaccines contain different allergens. Some may contain latex or eggs. Talk to your doctor about your allergies to make sure that you get the right vaccine. -pregnant or trying to get pregnant -breast-feeding How should I use this medicine? This vaccine is for injection into a muscle or under the skin. It is given by a health care professional. A copy of Vaccine Information Statements will be given before each vaccination. Read this sheet carefully each time. The sheet may change frequently. Talk to your healthcare provider to see which vaccines are right for you. Some vaccines should not be used in all age groups. Overdosage: If you think you have taken too much of this medicine contact a poison control center or emergency room at once. NOTE: This medicine is only for you. Do not share this medicine with others. What if I miss a dose? This does not apply. What may interact with this medicine? -chemotherapy or radiation therapy -medicines that lower your immune system like etanercept, anakinra, infliximab, and adalimumab -medicines that treat or prevent blood clots like warfarin -phenytoin -steroid medicines like prednisone or cortisone -theophylline -vaccines This list may not describe all possible interactions. Give your health care provider a list of all the medicines, herbs, non-prescription drugs, or dietary supplements you use. Also tell them if you smoke, drink alcohol, or use illegal drugs. Some items may interact with your medicine. What should I watch for while using this medicine? Report any side effects that do not go away within 3 days to your doctor or health care professional. Call your health  care provider if any unusual symptoms occur within 6 weeks of receiving this vaccine. You may still catch the flu, but the illness is not usually as bad. You cannot get the flu from the vaccine. The vaccine will not protect  against colds or other illnesses that may cause fever. The vaccine is needed every year. What side effects may I notice from receiving this medicine? Side effects that you should report to your doctor or health care professional as soon as possible: -allergic reactions like skin rash, itching or hives, swelling of the face, lips, or tongue Side effects that usually do not require medical attention (report to your doctor or health care professional if they continue or are bothersome): -fever -headache -muscle aches and pains -pain, tenderness, redness, or swelling at the injection site -tiredness This list may not describe all possible side effects. Call your doctor for medical advice about side effects. You may report side effects to FDA at 1-800-FDA-1088. Where should I keep my medicine? The vaccine will be given by a health care professional in a clinic, pharmacy, doctor's office, or other health care setting. You will not be given vaccine doses to store at home. NOTE: This sheet is a summary. It may not cover all possible information. If you have questions about this medicine, talk to your doctor, pharmacist, or health care provider.  2019 Elsevier/Gold Standard (2014-09-12 10:07:28)   Health Maintenance, Male A healthy lifestyle and preventive care is important for your health and wellness. Ask your health care provider about what schedule of regular examinations is right for you. What should I know about weight and diet? Eat a Healthy Diet  Eat plenty of vegetables, fruits, whole grains, low-fat dairy products, and lean protein.  Do not eat a lot of foods high in solid fats, added sugars, or salt.  Maintain a Healthy Weight Regular exercise can help you achieve or maintain a healthy weight. You should:  Do at least 150 minutes of exercise each week. The exercise should increase your heart rate and make you sweat (moderate-intensity exercise).  Do strength-training exercises at  least twice a week. Watch Your Levels of Cholesterol and Blood Lipids  Have your blood tested for lipids and cholesterol every 5 years starting at 75 years of age. If you are at high risk for heart disease, you should start having your blood tested when you are 75 years old. You may need to have your cholesterol levels checked more often if: ? Your lipid or cholesterol levels are high. ? You are older than 75 years of age. ? You are at high risk for heart disease. What should I know about cancer screening? Many types of cancers can be detected early and may often be prevented. Lung Cancer  You should be screened every year for lung cancer if: ? You are a current smoker who has smoked for at least 30 years. ? You are a former smoker who has quit within the past 15 years.  Talk to your health care provider about your screening options, when you should start screening, and how often you should be screened. Colorectal Cancer  Routine colorectal cancer screening usually begins at 75 years of age and should be repeated every 5-10 years until you are 75 years old. You may need to be screened more often if early forms of precancerous polyps or small growths are found. Your health care provider may recommend screening at an earlier age if you have risk factors for colon cancer.  Your health care provider may recommend using home test kits to check for hidden blood in the stool.  A small camera at the end of a tube can be used to examine your colon (sigmoidoscopy or colonoscopy). This checks for the earliest forms of colorectal cancer. Prostate and Testicular Cancer  Depending on your age and overall health, your health care provider may do certain tests to screen for prostate and testicular cancer.  Talk to your health care provider about any symptoms or concerns you have about testicular or prostate cancer. Skin Cancer  Check your skin from head to toe regularly.  Tell your health care  provider about any new moles or changes in moles, especially if: ? There is a change in a mole's size, shape, or color. ? You have a mole that is larger than a pencil eraser.  Always use sunscreen. Apply sunscreen liberally and repeat throughout the day.  Protect yourself by wearing long sleeves, pants, a wide-brimmed hat, and sunglasses when outside. What should I know about heart disease, diabetes, and high blood pressure?  If you are 50-54 years of age, have your blood pressure checked every 3-5 years. If you are 47 years of age or older, have your blood pressure checked every year. You should have your blood pressure measured twice-once when you are at a hospital or clinic, and once when you are not at a hospital or clinic. Record the average of the two measurements. To check your blood pressure when you are not at a hospital or clinic, you can use: ? An automated blood pressure machine at a pharmacy. ? A home blood pressure monitor.  Talk to your health care provider about your target blood pressure.  If you are between 78-57 years old, ask your health care provider if you should take aspirin to prevent heart disease.  Have regular diabetes screenings by checking your fasting blood sugar level. ? If you are at a normal weight and have a low risk for diabetes, have this test once every three years after the age of 56. ? If you are overweight and have a high risk for diabetes, consider being tested at a younger age or more often.  A one-time screening for abdominal aortic aneurysm (AAA) by ultrasound is recommended for men aged 92-75 years who are current or former smokers. What should I know about preventing infection? Hepatitis B If you have a higher risk for hepatitis B, you should be screened for this virus. Talk with your health care provider to find out if you are at risk for hepatitis B infection. Hepatitis C Blood testing is recommended for:  Everyone born from [birth year] through  1965.  Anyone with known risk factors for hepatitis C. Sexually Transmitted Diseases (STDs)  You should be screened each year for STDs including gonorrhea and chlamydia if: ? You are sexually active and are younger than 75 years of age. ? You are older than 75 years of age and your health care provider tells you that you are at risk for this type of infection. ? Your sexual activity has changed since you were last screened and you are at an increased risk for chlamydia or gonorrhea. Ask your health care provider if you are at risk.  Talk with your health care provider about whether you are at high risk of being infected with HIV. Your health care provider may recommend a prescription medicine to help prevent HIV infection. What else can I do?  Schedule regular health, dental,  and eye exams.  Stay current with your vaccines (immunizations).  Do not use any tobacco products, such as cigarettes, chewing tobacco, and e-cigarettes. If you need help quitting, ask your health care provider.  Limit alcohol intake to no more than 2 drinks per day. One drink equals 12 ounces of beer, 5 ounces of wine, or 1 ounces of hard liquor.  Do not use street drugs.  Do not share needles.  Ask your health care provider for help if you need support or information about quitting drugs.  Tell your health care provider if you often feel depressed.  Tell your health care provider if you have ever been abused or do not feel safe at home. This information is not intended to replace advice given to you by your health care provider. Make sure you discuss any questions you have with your health care provider. Document Released: 08/20/2007 Document Revised: 10/21/2015 Document Reviewed: 11/25/2014 Elsevier Interactive Patient Education  2019 [employer].

## 2018-07-02 ENCOUNTER — Encounter: Payer: Self-pay | Admitting: Internal Medicine

## 2018-08-10 ENCOUNTER — Other Ambulatory Visit: Payer: Self-pay

## 2018-08-10 ENCOUNTER — Ambulatory Visit: Payer: Medicare HMO | Admitting: *Deleted

## 2018-08-10 VITALS — Ht 68.0 in | Wt 180.0 lb

## 2018-08-10 DIAGNOSIS — Z86010 Personal history of colonic polyps: Secondary | ICD-10-CM

## 2018-08-10 NOTE — Progress Notes (Signed)

## 2018-08-23 ENCOUNTER — Telehealth: Payer: Self-pay | Admitting: Internal Medicine

## 2018-08-23 NOTE — Telephone Encounter (Signed)

## 2018-08-24 ENCOUNTER — Ambulatory Visit (AMBULATORY_SURGERY_CENTER): Payer: Medicare HMO | Admitting: Internal Medicine

## 2018-08-24 ENCOUNTER — Encounter: Payer: Self-pay | Admitting: Internal Medicine

## 2018-08-24 ENCOUNTER — Other Ambulatory Visit: Payer: Self-pay

## 2018-08-24 VITALS — BP 100/62 | HR 55 | Temp 98.7°F | Resp 19 | Ht 68.0 in | Wt 180.0 lb

## 2018-08-24 DIAGNOSIS — D125 Benign neoplasm of sigmoid colon: Secondary | ICD-10-CM | POA: Diagnosis not present

## 2018-08-24 DIAGNOSIS — D124 Benign neoplasm of descending colon: Secondary | ICD-10-CM | POA: Diagnosis not present

## 2018-08-24 DIAGNOSIS — D123 Benign neoplasm of transverse colon: Secondary | ICD-10-CM

## 2018-08-24 DIAGNOSIS — K219 Gastro-esophageal reflux disease without esophagitis: Secondary | ICD-10-CM | POA: Diagnosis not present

## 2018-08-24 DIAGNOSIS — Z86010 Personal history of colonic polyps: Secondary | ICD-10-CM

## 2018-08-24 DIAGNOSIS — D122 Benign neoplasm of ascending colon: Secondary | ICD-10-CM

## 2018-08-24 DIAGNOSIS — D120 Benign neoplasm of cecum: Secondary | ICD-10-CM

## 2018-08-24 MED ORDER — SODIUM CHLORIDE 0.9 % IV SOLN: 500.0000 mL | Freq: Once | INTRAVENOUS | Status: DC

## 2018-08-24 NOTE — Progress Notes (Signed)
Called to room to assist during endoscopic procedure.  Patient ID and intended procedure confirmed with present staff. Received instructions for my participation in the procedure from the performing physician.  

## 2018-08-24 NOTE — Patient Instructions (Addendum)
I found and removed 10 tiny polyps. I will let you know pathology results and when to have another routine colonoscopy by mail and/or My Chart.  I appreciate the opportunity to care for you. Gatha Mayer, MD, [facility]  Handout on polyps and diverticulosis given  YOU HAD AN ENDOSCOPIC PROCEDURE TODAY AT [place]:   Refer to the procedure report that was given to you for any specific questions about what was found during the examination.  If the procedure report does not answer your questions, please call your gastroenterologist to clarify.  If you requested that your care partner not be given the details of your procedure findings, then the procedure report has been included in a sealed envelope for you to review at your convenience later.  YOU SHOULD EXPECT: Some feelings of bloating in the abdomen. Passage of more gas than usual.  Walking can help get rid of the air that was put into your GI tract during the procedure and reduce the bloating. If you had a lower endoscopy (such as a colonoscopy or flexible sigmoidoscopy) you may notice spotting of blood in your stool or on the toilet paper. If you underwent a bowel prep for your procedure, you may not have a normal bowel movement for a few days.  Please Note:  You might notice some irritation and congestion in your nose or some drainage.  This is from the oxygen used during your procedure.  There is no need for concern and it should clear up in a day or so.  SYMPTOMS TO REPORT IMMEDIATELY:   Following lower endoscopy (colonoscopy or flexible sigmoidoscopy):  Excessive amounts of blood in the stool  Significant tenderness or worsening of abdominal pains  Swelling of the abdomen that is new, acute  Fever of 100F or higher   For urgent or emergent issues, a gastroenterologist can be reached at any hour by calling [phone].   DIET:  We do recommend a small meal at first, but then you may proceed to your  regular diet.  Drink plenty of fluids but you should avoid alcoholic beverages for 24 hours.  ACTIVITY:  You should plan to take it easy for the rest of today and you should NOT DRIVE or use heavy machinery until tomorrow (because of the sedation medicines used during the test).    FOLLOW UP: Our staff will call the number listed on your records 48-72 hours following your procedure to check on you and address any questions or concerns that you may have regarding the information given to you following your procedure. If we do not reach you, we will leave a message.  We will attempt to reach you two times.  During this call, we will ask if you have developed any symptoms of COVID 19. If you develop any symptoms (ie: fever, flu-like symptoms, shortness of breath, cough etc.) before then, please call [phone].  If you test positive for Covid 19 in the 2 weeks post procedure, please call and report this information to [place].    If any biopsies were taken you will be contacted by phone or by letter within the next 1-3 weeks.  Please call us at [phone] if you have not heard about the biopsies in 3 weeks.    SIGNATURES/CONFIDENTIALITY: You and/or your care partner have signed paperwork which will be entered into your electronic medical record.  These signatures attest to the fact that that the information above on your After Visit  Summary has been reviewed and is understood.  Full responsibility of the confidentiality of this discharge information lies with you and/or your care-partner. 

## 2018-08-24 NOTE — Progress Notes (Signed)
Pt's states no medical or surgical changes since previsit or office visit. 

## 2018-08-24 NOTE — Op Note (Signed)
[facility] Patient Name: Henry [name] Procedure Date: 08/24/2018 1:32 PM MRN: [id number] Endoscopist: Gatha Mayer , MD Age: 78 Referring MD:  Date of Birth: [birth year] Gender: Male Account #: [id number] Procedure:                Colonoscopy Indications:              Surveillance: Personal history of adenomatous                            polyps on last colonoscopy 3 years ago Medicines:                Propofol per Anesthesia, Monitored Anesthesia Care Procedure:                Pre-Anesthesia Assessment:                           - Prior to the procedure, a History and Physical                            was performed, and patient medications and                            allergies were reviewed. The patient's tolerance of                            previous anesthesia was also reviewed. The risks                            and benefits of the procedure and the sedation                            options and risks were discussed with the patient.                            All questions were answered, and informed consent                            was obtained. Prior Anticoagulants: The patient has                            taken no previous anticoagulant or antiplatelet                            agents. ASA Grade Assessment: I - A normal, healthy                            patient. After reviewing the risks and benefits,                            the patient was deemed in satisfactory condition to                            undergo the procedure.  After obtaining informed consent, the colonoscope                            was passed under direct vision. Throughout the                            procedure, the patient's blood pressure, pulse, and                            oxygen saturations were monitored continuously. The                            Model CF-HQ190L [id number]) scope was introduced                            through the  anus and advanced to the the cecum,                            identified by appendiceal orifice and ileocecal                            valve. The colonoscopy was performed without                            difficulty. The patient tolerated the procedure                            well. The quality of the bowel preparation was                            good. The bowel preparation used was Miralax via                            split dose instruction. The ileocecal valve,                            appendiceal orifice, and rectum were photographed. Scope In: 1:40:44 PM Scope Out: 2:05:40 PM Scope Withdrawal Time: 0 hours 22 minutes 21 seconds  Total Procedure Duration: 0 hours 24 minutes 56 seconds  Findings:                 The perianal and digital rectal examinations were                            normal.                           Eight sessile polyps were found in the sigmoid                            colon, descending colon, transverse colon and                            ascending colon. The polyps were diminutive in  size. These polyps were removed with a cold snare.                            Resection and retrieval were complete. Verification                            of patient identification for the specimen was done.                           Two sessile polyps were found in the cecum. The                            polyps were 1 to 2 mm in size. These polyps were                            removed with a cold biopsy forceps. Resection and                            retrieval were complete. Verification of patient                            identification for the specimen was done. Estimated                            blood loss was minimal.                           Multiple diverticula were found in the left colon.                           The exam was otherwise without abnormality on                            direct and retroflexion  views. Complications:            No immediate complications. Estimated Blood Loss:     Estimated blood loss was minimal. Impression:               - Eight diminutive polyps in the sigmoid colon, in                            the descending colon, in the transverse colon and                            in the ascending colon, removed with a cold snare.                            Resected and retrieved.                           - Two 1 to 2 mm polyps in the cecum, removed with a  cold biopsy forceps. Resected and retrieved.                           - Diverticulosis in the left colon.                           - The examination was otherwise normal on direct                            and retroflexion views.                           - Personal history of colonic polyps. 6 adenomas                            lifetime Recommendation:           - Patient has a contact number available for                            emergencies. The signs and symptoms of potential                            delayed complications were discussed with the                            patient. Return to normal activities tomorrow.                            Written discharge instructions were provided to the                            patient.                           - Resume previous diet.                           - Continue present medications.                           - Repeat colonoscopy is recommended for                            surveillance. The colonoscopy date will be                            determined after pathology results from today's                            exam become available for review. Gatha Mayer, MD 08/24/2018 2:16:38 PM This report has been signed electronically.

## 2018-08-24 NOTE — Progress Notes (Signed)
Report given to PACU, vss 

## 2018-08-28 ENCOUNTER — Telehealth: Payer: Self-pay

## 2018-08-28 NOTE — Telephone Encounter (Signed)
  Follow up Call-  Call back number 08/24/2018  Post procedure Call Back phone  # [phone]  Permission to leave phone message Yes  Some recent data might be hidden     Patient questions:  Do you have a fever, pain , or abdominal swelling? No. Pain Score  0 *  Have you tolerated food without any problems? Yes.    Have you been able to return to your normal activities? Yes.    Do you have any questions about your discharge instructions: Diet   No. Medications  No. Follow up visit  No.  Do you have questions or concerns about your Care? No.  Actions: * If pain score is 4 or above: No action needed, pain <4.  1. Have you developed a fever since your procedure? no  2.   Have you had an respiratory symptoms (SOB or cough) since your procedure? no  3.   Have you tested positive for COVID 19 since your procedure no  4.   Have you had any family members/close contacts diagnosed with the COVID 19 since your procedure?  no   If yes to any of these questions please route to Joylene John, RN and Alphonsa Gin, Therapist, sports.

## 2018-09-03 ENCOUNTER — Encounter: Payer: Self-pay | Admitting: Internal Medicine

## 2018-09-03 DIAGNOSIS — Z86010 Personal history of colonic polyps: Secondary | ICD-10-CM

## 2018-09-03 NOTE — Progress Notes (Signed)
10 diminutive polyps, mostly adenomas, some ssp and a hyperplastic Sister had colon ca at 57 He had 6 adenomas before this Will recall 2023

## 2019-03-19 ENCOUNTER — Ambulatory Visit: Payer: Medicare HMO

## 2019-04-16 ENCOUNTER — Emergency Department (HOSPITAL_COMMUNITY)
Admission: EM | Admit: 2019-04-16 | Discharge: 2019-04-16 | Disposition: A | Discharge status: Home / Self Care | Payer: Medicare HMO | Source: Home / Self Care | Attending: Emergency Medicine | Admitting: Emergency Medicine

## 2019-04-16 ENCOUNTER — Emergency Department (HOSPITAL_COMMUNITY): Payer: Medicare HMO

## 2019-04-16 ENCOUNTER — Other Ambulatory Visit: Payer: Self-pay

## 2019-04-16 DIAGNOSIS — R29818 Other symptoms and signs involving the nervous system: Secondary | ICD-10-CM | POA: Diagnosis not present

## 2019-04-16 DIAGNOSIS — J9601 Acute respiratory failure with hypoxia: Secondary | ICD-10-CM | POA: Diagnosis present

## 2019-04-16 DIAGNOSIS — R278 Other lack of coordination: Secondary | ICD-10-CM | POA: Diagnosis not present

## 2019-04-16 DIAGNOSIS — Z4682 Encounter for fitting and adjustment of non-vascular catheter: Secondary | ICD-10-CM | POA: Diagnosis not present

## 2019-04-16 DIAGNOSIS — R1310 Dysphagia, unspecified: Secondary | ICD-10-CM | POA: Diagnosis present

## 2019-04-16 DIAGNOSIS — G936 Cerebral edema: Secondary | ICD-10-CM | POA: Diagnosis present

## 2019-04-16 DIAGNOSIS — I663 Occlusion and stenosis of cerebellar arteries: Secondary | ICD-10-CM | POA: Diagnosis not present

## 2019-04-16 DIAGNOSIS — U071 COVID-19: Secondary | ICD-10-CM | POA: Diagnosis present

## 2019-04-16 DIAGNOSIS — R29810 Facial weakness: Secondary | ICD-10-CM | POA: Diagnosis present

## 2019-04-16 DIAGNOSIS — I82462 Acute embolism and thrombosis of left calf muscular vein: Secondary | ICD-10-CM | POA: Diagnosis present

## 2019-04-16 DIAGNOSIS — I10 Essential (primary) hypertension: Secondary | ICD-10-CM | POA: Diagnosis present

## 2019-04-16 DIAGNOSIS — R410 Disorientation, unspecified: Secondary | ICD-10-CM | POA: Diagnosis not present

## 2019-04-16 DIAGNOSIS — B9561 Methicillin susceptible Staphylococcus aureus infection as the cause of diseases classified elsewhere: Secondary | ICD-10-CM | POA: Diagnosis present

## 2019-04-16 DIAGNOSIS — M545 Low back pain: Secondary | ICD-10-CM | POA: Diagnosis present

## 2019-04-16 DIAGNOSIS — R1312 Dysphagia, oropharyngeal phase: Secondary | ICD-10-CM | POA: Diagnosis not present

## 2019-04-16 DIAGNOSIS — R41841 Cognitive communication deficit: Secondary | ICD-10-CM | POA: Diagnosis not present

## 2019-04-16 DIAGNOSIS — I63511 Cerebral infarction due to unspecified occlusion or stenosis of right middle cerebral artery: Secondary | ICD-10-CM | POA: Diagnosis not present

## 2019-04-16 DIAGNOSIS — J984 Other disorders of lung: Secondary | ICD-10-CM | POA: Diagnosis not present

## 2019-04-16 DIAGNOSIS — R7989 Other specified abnormal findings of blood chemistry: Secondary | ICD-10-CM | POA: Diagnosis not present

## 2019-04-16 DIAGNOSIS — R471 Dysarthria and anarthria: Secondary | ICD-10-CM | POA: Diagnosis present

## 2019-04-16 DIAGNOSIS — R05 Cough: Secondary | ICD-10-CM | POA: Diagnosis not present

## 2019-04-16 DIAGNOSIS — R4781 Slurred speech: Secondary | ICD-10-CM | POA: Diagnosis not present

## 2019-04-16 DIAGNOSIS — R29723 NIHSS score 23: Secondary | ICD-10-CM | POA: Diagnosis not present

## 2019-04-16 DIAGNOSIS — M6281 Muscle weakness (generalized): Secondary | ICD-10-CM | POA: Diagnosis not present

## 2019-04-16 DIAGNOSIS — I63411 Cerebral infarction due to embolism of right middle cerebral artery: Secondary | ICD-10-CM | POA: Diagnosis present

## 2019-04-16 DIAGNOSIS — R000 Tachycardia, unspecified: Secondary | ICD-10-CM | POA: Insufficient documentation

## 2019-04-16 DIAGNOSIS — R29716 NIHSS score 16: Secondary | ICD-10-CM | POA: Diagnosis present

## 2019-04-16 DIAGNOSIS — I69912 Visuospatial deficit and spatial neglect following unspecified cerebrovascular disease: Secondary | ICD-10-CM | POA: Diagnosis not present

## 2019-04-16 DIAGNOSIS — D696 Thrombocytopenia, unspecified: Secondary | ICD-10-CM | POA: Diagnosis present

## 2019-04-16 DIAGNOSIS — R739 Hyperglycemia, unspecified: Secondary | ICD-10-CM | POA: Diagnosis present

## 2019-04-16 DIAGNOSIS — E860 Dehydration: Secondary | ICD-10-CM | POA: Diagnosis present

## 2019-04-16 DIAGNOSIS — I639 Cerebral infarction, unspecified: Secondary | ICD-10-CM | POA: Diagnosis not present

## 2019-04-16 DIAGNOSIS — M2550 Pain in unspecified joint: Secondary | ICD-10-CM | POA: Diagnosis not present

## 2019-04-16 DIAGNOSIS — I63413 Cerebral infarction due to embolism of bilateral middle cerebral arteries: Secondary | ICD-10-CM | POA: Diagnosis not present

## 2019-04-16 DIAGNOSIS — G8194 Hemiplegia, unspecified affecting left nondominant side: Secondary | ICD-10-CM | POA: Diagnosis present

## 2019-04-16 DIAGNOSIS — I361 Nonrheumatic tricuspid (valve) insufficiency: Secondary | ICD-10-CM | POA: Diagnosis not present

## 2019-04-16 DIAGNOSIS — D62 Acute posthemorrhagic anemia: Secondary | ICD-10-CM | POA: Diagnosis not present

## 2019-04-16 DIAGNOSIS — I6601 Occlusion and stenosis of right middle cerebral artery: Secondary | ICD-10-CM | POA: Diagnosis not present

## 2019-04-16 DIAGNOSIS — J1282 Pneumonia due to coronavirus disease 2019: Secondary | ICD-10-CM

## 2019-04-16 DIAGNOSIS — R0602 Shortness of breath: Secondary | ICD-10-CM | POA: Diagnosis not present

## 2019-04-16 DIAGNOSIS — Z209 Contact with and (suspected) exposure to unspecified communicable disease: Secondary | ICD-10-CM | POA: Diagnosis not present

## 2019-04-16 DIAGNOSIS — R2689 Other abnormalities of gait and mobility: Secondary | ICD-10-CM | POA: Diagnosis not present

## 2019-04-16 DIAGNOSIS — K219 Gastro-esophageal reflux disease without esophagitis: Secondary | ICD-10-CM | POA: Diagnosis present

## 2019-04-16 DIAGNOSIS — R29727 NIHSS score 27: Secondary | ICD-10-CM | POA: Diagnosis not present

## 2019-04-16 DIAGNOSIS — I081 Rheumatic disorders of both mitral and tricuspid valves: Secondary | ICD-10-CM | POA: Diagnosis present

## 2019-04-16 DIAGNOSIS — I63233 Cerebral infarction due to unspecified occlusion or stenosis of bilateral carotid arteries: Secondary | ICD-10-CM | POA: Diagnosis not present

## 2019-04-16 DIAGNOSIS — G459 Transient cerebral ischemic attack, unspecified: Secondary | ICD-10-CM | POA: Diagnosis not present

## 2019-04-16 DIAGNOSIS — G8929 Other chronic pain: Secondary | ICD-10-CM | POA: Diagnosis present

## 2019-04-16 DIAGNOSIS — R509 Fever, unspecified: Secondary | ICD-10-CM | POA: Insufficient documentation

## 2019-04-16 DIAGNOSIS — Z7401 Bed confinement status: Secondary | ICD-10-CM | POA: Diagnosis not present

## 2019-04-16 DIAGNOSIS — R5381 Other malaise: Secondary | ICD-10-CM | POA: Diagnosis not present

## 2019-04-16 DIAGNOSIS — R29725 NIHSS score 25: Secondary | ICD-10-CM | POA: Diagnosis not present

## 2019-04-16 DIAGNOSIS — J9600 Acute respiratory failure, unspecified whether with hypoxia or hypercapnia: Secondary | ICD-10-CM | POA: Diagnosis not present

## 2019-04-16 DIAGNOSIS — R404 Transient alteration of awareness: Secondary | ICD-10-CM | POA: Diagnosis not present

## 2019-04-16 DIAGNOSIS — R2681 Unsteadiness on feet: Secondary | ICD-10-CM | POA: Diagnosis not present

## 2019-04-16 DIAGNOSIS — R531 Weakness: Secondary | ICD-10-CM | POA: Diagnosis not present

## 2019-04-16 DIAGNOSIS — R0902 Hypoxemia: Secondary | ICD-10-CM | POA: Diagnosis not present

## 2019-04-16 LAB — URINALYSIS, ROUTINE W REFLEX MICROSCOPIC
Bacteria, UA: NONE SEEN
Bilirubin Urine: NEGATIVE
Glucose, UA: NEGATIVE mg/dL
Hgb urine dipstick: NEGATIVE
Ketones, ur: NEGATIVE mg/dL
Leukocytes,Ua: NEGATIVE
Nitrite: NEGATIVE
Protein, ur: 100 mg/dL — AB
Specific Gravity, Urine: 1.024 (ref 1.005–1.030)
pH: 6 (ref 5.0–8.0)

## 2019-04-16 LAB — COMPREHENSIVE METABOLIC PANEL
ALT: 40 U/L (ref 0–44)
AST: 44 U/L — ABNORMAL HIGH (ref 15–41)
Albumin: 2.9 g/dL — ABNORMAL LOW (ref 3.5–5.0)
Alkaline Phosphatase: 83 U/L (ref 38–126)
Anion gap: 11 (ref 5–15)
BUN: 17 mg/dL (ref 8–23)
CO2: 21 mmol/L — ABNORMAL LOW (ref 22–32)
Calcium: 8.7 mg/dL — ABNORMAL LOW (ref 8.9–10.3)
Chloride: 102 mmol/L (ref 98–111)
Creatinine, Ser: 0.94 mg/dL (ref 0.61–1.24)
GFR calc Af Amer: >60 mL/min (ref >60)
GFR calc non Af Amer: >60 mL/min (ref >60)
Glucose, Bld: 120 mg/dL — ABNORMAL HIGH (ref 70–99)
Potassium: 4.5 mmol/L (ref 3.5–5.1)
Sodium: 134 mmol/L — ABNORMAL LOW (ref 135–145)
Total Bilirubin: 1.1 mg/dL (ref 0.3–1.2)
Total Protein: 6.9 g/dL (ref 6.5–8.1)

## 2019-04-16 LAB — POC SARS CORONAVIRUS 2 AG -  ED: SARS Coronavirus 2 Ag: NEGATIVE

## 2019-04-16 LAB — CBC WITH DIFFERENTIAL/PLATELET
Abs Immature Granulocytes: 0.02 10*3/uL (ref 0.00–0.07)
Basophils Absolute: 0 10*3/uL (ref 0.0–0.1)
Basophils Relative: 0 %
Eosinophils Absolute: 0.1 10*3/uL (ref 0.0–0.5)
Eosinophils Relative: 1 %
HCT: 40.5 % (ref 39.0–52.0)
Hemoglobin: 13.8 g/dL (ref 13.0–17.0)
Immature Granulocytes: 0 %
Lymphocytes Relative: 19 %
Lymphs Abs: 1 10*3/uL (ref 0.7–4.0)
MCH: 37 pg — ABNORMAL HIGH (ref 26.0–34.0)
MCHC: 34.1 g/dL (ref 30.0–36.0)
MCV: 108.6 fL — ABNORMAL HIGH (ref 80.0–100.0)
Monocytes Absolute: 0.5 10*3/uL (ref 0.1–1.0)
Monocytes Relative: 9 %
Neutro Abs: 3.7 10*3/uL (ref 1.7–7.7)
Neutrophils Relative %: 71 %
Platelets: 145 10*3/uL — ABNORMAL LOW (ref 150–400)
RBC: 3.73 MIL/uL — ABNORMAL LOW (ref 4.22–5.81)
RDW: 13.7 % (ref 11.5–15.5)
WBC: 5.3 10*3/uL (ref 4.0–10.5)
nRBC: 0 % (ref 0.0–0.2)

## 2019-04-16 LAB — RESPIRATORY PANEL BY RT PCR (FLU A&B, COVID)
Influenza A by PCR: NEGATIVE
Influenza B by PCR: NEGATIVE
SARS Coronavirus 2 by RT PCR: POSITIVE — AB

## 2019-04-16 LAB — LACTIC ACID, PLASMA
Lactic Acid, Venous: 1.6 mmol/L (ref 0.5–1.9)
Lactic Acid, Venous: 1.5 mmol/L (ref 0.5–1.9)

## 2019-04-16 MED ORDER — SODIUM CHLORIDE 0.9 % IV SOLN
1.0000 g | Freq: Once | INTRAVENOUS | Status: AC
  Administered 2019-04-16: 1 g via INTRAVENOUS
  Filled 2019-04-16: qty 10

## 2019-04-16 MED ORDER — SODIUM CHLORIDE 0.9 % IV SOLN
500.0000 mg | Freq: Once | INTRAVENOUS | Status: AC
  Administered 2019-04-16: 18:00:00 500 mg via INTRAVENOUS
  Filled 2019-04-16: qty 500

## 2019-04-16 NOTE — ED Provider Notes (Signed)
[facility] EMERGENCY DEPARTMENT Provider Note   CSN: [id number] Arrival date & time: 04/16/19  1319     History No chief complaint on file.   Henry [name] is a 79 y.o. male.  HPI 79 year old male presents with 1 week of cough and fever.  Has had a temp up to 103.  Symptoms have not gone away so he presents today.  He feels generally weak.  No significant shortness of breath.  No vomiting, chest pain, diarrhea.  EMS noted his sats to be about 89% on room air was placed on oxygen.  Here he does not have any hypoxia.   Past Medical History:  Diagnosis Date  . Colon polyps   . Elevated MCV 2011  . GERD (gastroesophageal reflux disease) 2012  . 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 Osteoarthritis   . Polycythemia 2011    Patient Active Problem List   Diagnosis Date Noted  . 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 Chest wall pain 07/04/2016  . 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 BPH (benign prostatic hyperplasia) 12/01/2015  . Heel pain 05/13/2015  . Inguinal hernia 03/19/2014  . Left hip pain 01/10/2014  . HTN (hypertension) 01/10/2014  . Well adult exam 09/12/2011  . Personal history of colonic polyps 06/07/2010  . GERD 03/16/2010  . COUGH 08/11/2009  . POLYCYTHEMIA 07/01/2009  . Osteoarthritis 04/17/2007  . LOW BACK PAIN 04/17/2007  . LEG PAIN 04/17/2007  . PARESTHESIA 04/17/2007    Past Surgical History:  Procedure Laterality Date  . APPENDECTOMY    . COLONOSCOPY    . HERNIA REPAIR     march 2019  . INGUINAL HERNIA REPAIR Left 2015  . LUMBAR LAMINECTOMY/DECOMPRESSION MICRODISCECTOMY N/A 05/20/2016   Procedure: Lumbar Three-Four,Lunmbar Four-Five,Lumbar Five-Sacral One Laminectomy/Foraminotomy;  Surgeon: Kristeen Miss, MD;  Location: [facility];  Service: Neurosurgery;  Laterality: N/A;  . POLYPECTOMY         Family History  Problem Relation Age of Onset  . Colon cancer  Sister 47  . Breast cancer Other        1st degree relative  . Stroke Neg Hx   . Esophageal cancer Neg Hx   . Pancreatic cancer Neg Hx   . Prostate cancer Neg Hx   . Rectal cancer Neg Hx   . Stomach cancer Neg Hx     Social History   Tobacco Use  . Smoking status: Former Smoker    Packs/day: 1.00    Types: Cigarettes    Quit date: 05/24/1976    Years since quitting: 42.9  . Smokeless tobacco: Never Used  Substance Use Topics  . Alcohol use: No  . Drug use: No    Home Medications Prior to Admission medications   Medication Sig Start Date End Date Taking? Authorizing Provider  cholecalciferol (VITAMIN D) 1000 units tablet Take 1,000 Units by mouth daily.    [provider]  Misc Natural Products (PROSTATE HEALTH) CAPS Take 1 capsule by mouth daily.    [provider]  Multiple Vitamins-Minerals (SUPER MEGA VITE 75/BETA CARO PO) Take 1 tablet by mouth 2 (two) times daily.    [provider]  naproxen sodium (ALEVE) 220 MG tablet Take 220 mg by mouth as needed.    [provider]  Probiotic Product (ALIGN PO) Take 1 capsule by mouth daily.    [provider]    Allergies  No known allergies  Review of Systems   Review of Systems  Constitutional: Positive for fever.  Respiratory: Positive for cough. Negative for shortness of breath.   Cardiovascular: Negative for chest pain.  Neurological: Positive for weakness.  All other systems reviewed and are negative.   Physical Exam Updated Vital Signs BP 134/86 (BP Location: Right Arm)   Pulse 99   Temp 99.7 F (37.6 C) (Oral)   Resp (!) 24   Ht 5\' 8"  (1.727 m)   Wt 81.6 kg   SpO2 92%   BMI 27.37 kg/m   Physical Exam Vitals and nursing note reviewed.  Constitutional:      General: He is not in acute distress.    Appearance: He is well-developed. He is not diaphoretic.  HENT:     Head: Normocephalic and atraumatic.     Right Ear: External ear normal.     Left Ear:  External ear normal.     Nose: Nose normal.  Eyes:     General:        Right eye: No discharge.        Left eye: No discharge.  Cardiovascular:     Rate and Rhythm: Regular rhythm. Tachycardia present.     Comments: HR~100 Pulmonary:     Effort: Tachypnea present. No accessory muscle usage.  Abdominal:     Palpations: Abdomen is soft.     Tenderness: There is no abdominal tenderness.  Musculoskeletal:     Cervical back: Neck supple.     Right lower leg: No edema.     Left lower leg: No edema.  Skin:    General: Skin is warm and dry.  Neurological:     Mental Status: He is alert.  Psychiatric:        Mood and Affect: Mood is not anxious.     ED Results / Procedures / Treatments   Labs (all labs ordered are listed, but only abnormal results are displayed) Labs Reviewed  CBC WITH DIFFERENTIAL/PLATELET - Abnormal; Notable for the following components:      Result Value   RBC 3.73 (*)    MCV 108.6 (*)    MCH 37.0 (*)    Platelets 145 (*)    All other components within normal limits  CULTURE, BLOOD (ROUTINE X 2)  CULTURE, BLOOD (ROUTINE X 2)  URINE CULTURE  LACTIC ACID, PLASMA  LACTIC ACID, PLASMA  COMPREHENSIVE METABOLIC PANEL  URINALYSIS, ROUTINE W REFLEX MICROSCOPIC  POC SARS CORONAVIRUS 2 AG -  ED    EKG None  Radiology DG Chest Portable 1 View  Result Date: 04/16/2019 CLINICAL DATA:  Cough and fever. New weakness. EXAM: PORTABLE CHEST 1 VIEW COMPARISON:  08/11/2009 FINDINGS: Normal sized heart. Interval diffuse prominence of the interstitial markings and patchy opacities in the mid and lower lung zones bilaterally. No visible pleural fluid. Diffuse osteo IMPRESSION: Interval probable bilateral pneumonia and pneumonitis. Electronically Signed   By: Claudie Revering M.D.   On: 04/16/2019 14:28    Procedures Procedures (including critical care time)  Medications Ordered in ED Medications - No data to display  ED Course  I have reviewed the triage vital signs and  the nursing notes.  Pertinent labs & imaging results that were available during my care of the patient were reviewed by me and considered in my medical decision making (see chart for details).    MDM Rules/Calculators/A&P  Patient has some increased work of breathing and mild hypoxia in the 92% range.  Is not in distress.  Does not appear septic is currently afebrile.  No hypotension.  At this point, based on history and presentation my highest concern is for Covid.  This testing is pending.  If he is Covid positive I do not think he also has bacterial pneumonia.  Given he is not septic I think is okay to wait for Covid testing.  Care to Dr. Vanita Panda.  Henry [name] was evaluated in Emergency Department on 04/16/2019 for the symptoms described in the history of present illness. He was evaluated in the context of the global COVID-19 pandemic, which necessitated consideration that the patient might be at risk for infection with the SARS-CoV-2 virus that causes COVID-19. Institutional protocols and algorithms that pertain to the evaluation of patients at risk for COVID-19 are in a state of rapid change based on information released by regulatory bodies including the CDC and federal and state organizations. These policies and algorithms were followed during the patient's care in the ED.  Final Clinical Impression(s) / ED Diagnoses Final diagnoses:  None    Rx / DC Orders ED Discharge Orders    None       Sherwood Gambler, MD 04/16/19 1538

## 2019-04-16 NOTE — ED Provider Notes (Addendum)
Care of the patient signed at signout.3:54 PM Patient has oxygen saturation 94% room air, is slightly tachycardic, with a rate 105.  He is awake, alert, and aware of point-of-care Covid test which was negative. We discussed the suspicion for pneumonia, likely Covid with a false negative on the point-of-care testing, versus bacterial pneumonia need for ongoing testing. I conveyed my recommendation for admission for further monitoring, management, testing, therapy.  Patient declines this recommendation, has the capacity to do so, states an understanding of the risks of doing so.  He is amenable to initial antibiotics for possible bacterial pneumonia, repeat testing that will be available after discharge.  Update: Patient nearly finished with initial antibiotics, still requests discharge following completion of this therapy.  7:17 PM I discussed the patient's results with his wife, after speaking with him.  Patient's follow-up coronavirus test is positive for infection, and with concern for pneumonia, increased work of breathing, again I made a recommendation to him that he should be admitted for further monitoring, therapy. Patient declines this recommendation, states that he is ready to go home. I discussed his presentation with his wife, who confirms the patient has no history of dementia, and as the patient has capacity for this request, he was discharged with return precautions and follow-up instructions.   Carmin Muskrat, MD 04/16/19 1555    Carmin Muskrat, MD 04/16/19 [phone]

## 2019-04-16 NOTE — ED Notes (Signed)
Patient verbalizes understanding of discharge instructions. Opportunity for questioning and answers were provided regarding discharge care and COVID-19.  pt discharged from ED with daughter.

## 2019-04-16 NOTE — ED Triage Notes (Signed)
Patient arrives via EMS for new weakness, cough, and fever x3 days. Patient states that he hasn't been feeling well x1 week, however, his symptoms worsened this Sunday. With ems, patient was noted to have a fever of 101.2 with saturations being 89% on room air. EMS placed patient on 2L which increased saturations to 95%.   Patients states that he is SOB with exertion. Tachypnea at rest.

## 2019-04-16 NOTE — Discharge Instructions (Addendum)
As discussed, you have been diagnosed with coronavirus.  It is very portly to call your physician tomorrow to arrange appropriate ongoing care, and discuss therapeutic options. Though you have elected to leave the hospital, do not hesitate to return should you feel substantially worse or develop any new, or concerning changes in your condition.

## 2019-04-17 ENCOUNTER — Inpatient Hospital Stay (HOSPITAL_COMMUNITY): Payer: Medicare HMO

## 2019-04-17 ENCOUNTER — Inpatient Hospital Stay (HOSPITAL_COMMUNITY)
Admission: EM | Admit: 2019-04-17 | Discharge: 2019-04-25 | DRG: 023 | Disposition: A | Discharge status: Skilled Nursing Facility | Payer: Medicare HMO | Attending: Internal Medicine | Admitting: Internal Medicine

## 2019-04-17 ENCOUNTER — Encounter (HOSPITAL_COMMUNITY): Payer: Self-pay | Admitting: Neurology

## 2019-04-17 ENCOUNTER — Inpatient Hospital Stay (HOSPITAL_COMMUNITY): Payer: Medicare HMO | Admitting: Certified Registered Nurse Anesthetist

## 2019-04-17 ENCOUNTER — Emergency Department (HOSPITAL_COMMUNITY): Payer: Medicare HMO

## 2019-04-17 ENCOUNTER — Other Ambulatory Visit (HOSPITAL_COMMUNITY): Payer: Medicare HMO

## 2019-04-17 ENCOUNTER — Telehealth: Payer: Self-pay | Admitting: Internal Medicine

## 2019-04-17 ENCOUNTER — Encounter (HOSPITAL_COMMUNITY)
Admission: EM | Disposition: A | Discharge status: Skilled Nursing Facility | Payer: Self-pay | Source: Home / Self Care | Attending: Internal Medicine

## 2019-04-17 DIAGNOSIS — R471 Dysarthria and anarthria: Secondary | ICD-10-CM | POA: Diagnosis present

## 2019-04-17 DIAGNOSIS — R29716 NIHSS score 16: Secondary | ICD-10-CM | POA: Diagnosis present

## 2019-04-17 DIAGNOSIS — E785 Hyperlipidemia, unspecified: Secondary | ICD-10-CM | POA: Diagnosis present

## 2019-04-17 DIAGNOSIS — R29727 NIHSS score 27: Secondary | ICD-10-CM | POA: Diagnosis not present

## 2019-04-17 DIAGNOSIS — K219 Gastro-esophageal reflux disease without esophagitis: Secondary | ICD-10-CM | POA: Diagnosis present

## 2019-04-17 DIAGNOSIS — R29810 Facial weakness: Secondary | ICD-10-CM | POA: Diagnosis present

## 2019-04-17 DIAGNOSIS — R29723 NIHSS score 23: Secondary | ICD-10-CM | POA: Diagnosis not present

## 2019-04-17 DIAGNOSIS — D62 Acute posthemorrhagic anemia: Secondary | ICD-10-CM | POA: Diagnosis not present

## 2019-04-17 DIAGNOSIS — I081 Rheumatic disorders of both mitral and tricuspid valves: Secondary | ICD-10-CM | POA: Diagnosis present

## 2019-04-17 DIAGNOSIS — D696 Thrombocytopenia, unspecified: Secondary | ICD-10-CM | POA: Diagnosis present

## 2019-04-17 DIAGNOSIS — Z86010 Personal history of colonic polyps: Secondary | ICD-10-CM

## 2019-04-17 DIAGNOSIS — I639 Cerebral infarction, unspecified: Secondary | ICD-10-CM

## 2019-04-17 DIAGNOSIS — I63511 Cerebral infarction due to unspecified occlusion or stenosis of right middle cerebral artery: Secondary | ICD-10-CM | POA: Diagnosis not present

## 2019-04-17 DIAGNOSIS — I6601 Occlusion and stenosis of right middle cerebral artery: Secondary | ICD-10-CM | POA: Diagnosis not present

## 2019-04-17 DIAGNOSIS — G936 Cerebral edema: Secondary | ICD-10-CM | POA: Diagnosis present

## 2019-04-17 DIAGNOSIS — I82462 Acute embolism and thrombosis of left calf muscular vein: Secondary | ICD-10-CM | POA: Diagnosis present

## 2019-04-17 DIAGNOSIS — I63413 Cerebral infarction due to embolism of bilateral middle cerebral arteries: Secondary | ICD-10-CM | POA: Diagnosis not present

## 2019-04-17 DIAGNOSIS — E860 Dehydration: Secondary | ICD-10-CM | POA: Diagnosis present

## 2019-04-17 DIAGNOSIS — B9561 Methicillin susceptible Staphylococcus aureus infection as the cause of diseases classified elsewhere: Secondary | ICD-10-CM | POA: Diagnosis present

## 2019-04-17 DIAGNOSIS — I361 Nonrheumatic tricuspid (valve) insufficiency: Secondary | ICD-10-CM | POA: Diagnosis not present

## 2019-04-17 DIAGNOSIS — J1282 Pneumonia due to coronavirus disease 2019: Secondary | ICD-10-CM | POA: Diagnosis present

## 2019-04-17 DIAGNOSIS — M545 Low back pain: Secondary | ICD-10-CM | POA: Diagnosis present

## 2019-04-17 DIAGNOSIS — M1990 Unspecified osteoarthritis, unspecified site: Secondary | ICD-10-CM | POA: Diagnosis present

## 2019-04-17 DIAGNOSIS — R739 Hyperglycemia, unspecified: Secondary | ICD-10-CM | POA: Diagnosis present

## 2019-04-17 DIAGNOSIS — I63411 Cerebral infarction due to embolism of right middle cerebral artery: Secondary | ICD-10-CM | POA: Diagnosis present

## 2019-04-17 DIAGNOSIS — R1310 Dysphagia, unspecified: Secondary | ICD-10-CM | POA: Diagnosis present

## 2019-04-17 DIAGNOSIS — G8194 Hemiplegia, unspecified affecting left nondominant side: Secondary | ICD-10-CM | POA: Diagnosis present

## 2019-04-17 DIAGNOSIS — U071 COVID-19: Secondary | ICD-10-CM | POA: Diagnosis present

## 2019-04-17 DIAGNOSIS — G8929 Other chronic pain: Secondary | ICD-10-CM | POA: Diagnosis present

## 2019-04-17 DIAGNOSIS — Z79899 Other long term (current) drug therapy: Secondary | ICD-10-CM

## 2019-04-17 DIAGNOSIS — J9601 Acute respiratory failure with hypoxia: Secondary | ICD-10-CM | POA: Diagnosis present

## 2019-04-17 DIAGNOSIS — N400 Enlarged prostate without lower urinary tract symptoms: Secondary | ICD-10-CM | POA: Diagnosis present

## 2019-04-17 DIAGNOSIS — R0602 Shortness of breath: Secondary | ICD-10-CM

## 2019-04-17 DIAGNOSIS — R7989 Other specified abnormal findings of blood chemistry: Secondary | ICD-10-CM | POA: Diagnosis not present

## 2019-04-17 DIAGNOSIS — Z87891 Personal history of nicotine dependence: Secondary | ICD-10-CM

## 2019-04-17 DIAGNOSIS — I10 Essential (primary) hypertension: Secondary | ICD-10-CM | POA: Diagnosis present

## 2019-04-17 DIAGNOSIS — R29725 NIHSS score 25: Secondary | ICD-10-CM | POA: Diagnosis not present

## 2019-04-17 DIAGNOSIS — J9600 Acute respiratory failure, unspecified whether with hypoxia or hypercapnia: Secondary | ICD-10-CM

## 2019-04-17 DIAGNOSIS — Z8673 Personal history of transient ischemic attack (TIA), and cerebral infarction without residual deficits: Secondary | ICD-10-CM | POA: Diagnosis present

## 2019-04-17 HISTORY — PX: IR CT HEAD LTD: IMG2386

## 2019-04-17 HISTORY — PX: IR PERCUTANEOUS ART THROMBECTOMY/INFUSION INTRACRANIAL INC DIAG ANGIO: IMG6087

## 2019-04-17 HISTORY — PX: RADIOLOGY WITH ANESTHESIA: SHX6223

## 2019-04-17 HISTORY — DX: COVID-19: U07.1

## 2019-04-17 LAB — POCT I-STAT 7, (LYTES, BLD GAS, ICA,H+H)
Acid-Base Excess: 2 mmol/L (ref 0.0–2.0)
Bicarbonate: 27.1 mmol/L (ref 20.0–28.0)
Calcium, Ion: 1.13 mmol/L — ABNORMAL LOW (ref 1.15–1.40)
HCT: 32 % — ABNORMAL LOW (ref 39.0–52.0)
Hemoglobin: 10.9 g/dL — ABNORMAL LOW (ref 13.0–17.0)
O2 Saturation: 100 %
Potassium: 4.1 mmol/L (ref 3.5–5.1)
Sodium: 137 mmol/L (ref 135–145)
TCO2: 28 mmol/L (ref 22–32)
pCO2 arterial: 43.1 mmHg (ref 32.0–48.0)
pH, Arterial: 7.407 (ref 7.350–7.450)
pO2, Arterial: 449 mmHg — ABNORMAL HIGH (ref 83.0–108.0)

## 2019-04-17 LAB — PROTIME-INR
INR: 1.2 (ref 0.8–1.2)
Prothrombin Time: 15.6 seconds — ABNORMAL HIGH (ref 11.4–15.2)

## 2019-04-17 LAB — COMPREHENSIVE METABOLIC PANEL
ALT: 33 U/L (ref 0–44)
AST: 36 U/L (ref 15–41)
Albumin: 2.5 g/dL — ABNORMAL LOW (ref 3.5–5.0)
Alkaline Phosphatase: 71 U/L (ref 38–126)
Anion gap: 12 (ref 5–15)
BUN: 18 mg/dL (ref 8–23)
CO2: 21 mmol/L — ABNORMAL LOW (ref 22–32)
Calcium: 8.5 mg/dL — ABNORMAL LOW (ref 8.9–10.3)
Chloride: 103 mmol/L (ref 98–111)
Creatinine, Ser: 0.95 mg/dL (ref 0.61–1.24)
GFR calc Af Amer: >60 mL/min (ref >60)
GFR calc non Af Amer: >60 mL/min (ref >60)
Glucose, Bld: 158 mg/dL — ABNORMAL HIGH (ref 70–99)
Potassium: 4 mmol/L (ref 3.5–5.1)
Sodium: 136 mmol/L (ref 135–145)
Total Bilirubin: 1 mg/dL (ref 0.3–1.2)
Total Protein: 6 g/dL — ABNORMAL LOW (ref 6.5–8.1)

## 2019-04-17 LAB — CBC
HCT: 37.8 % — ABNORMAL LOW (ref 39.0–52.0)
Hemoglobin: 12.7 g/dL — ABNORMAL LOW (ref 13.0–17.0)
MCH: 36.8 pg — ABNORMAL HIGH (ref 26.0–34.0)
MCHC: 33.6 g/dL (ref 30.0–36.0)
MCV: 109.6 fL — ABNORMAL HIGH (ref 80.0–100.0)
Platelets: 130 10*3/uL — ABNORMAL LOW (ref 150–400)
RBC: 3.45 MIL/uL — ABNORMAL LOW (ref 4.22–5.81)
RDW: 13.7 % (ref 11.5–15.5)
WBC: 5.1 10*3/uL (ref 4.0–10.5)
nRBC: 0 % (ref 0.0–0.2)

## 2019-04-17 LAB — I-STAT CHEM 8, ED
BUN: 18 mg/dL (ref 8–23)
Calcium, Ion: 1.09 mmol/L — ABNORMAL LOW (ref 1.15–1.40)
Chloride: 102 mmol/L (ref 98–111)
Creatinine, Ser: 0.7 mg/dL (ref 0.61–1.24)
Glucose, Bld: 150 mg/dL — ABNORMAL HIGH (ref 70–99)
HCT: 37 % — ABNORMAL LOW (ref 39.0–52.0)
Hemoglobin: 12.6 g/dL — ABNORMAL LOW (ref 13.0–17.0)
Potassium: 3.8 mmol/L (ref 3.5–5.1)
Sodium: 136 mmol/L (ref 135–145)
TCO2: 23 mmol/L (ref 22–32)

## 2019-04-17 LAB — GLUCOSE, CAPILLARY
Glucose-Capillary: 106 mg/dL — ABNORMAL HIGH (ref 70–99)
Glucose-Capillary: 120 mg/dL — ABNORMAL HIGH (ref 70–99)
Glucose-Capillary: 136 mg/dL — ABNORMAL HIGH (ref 70–99)

## 2019-04-17 LAB — DIFFERENTIAL
Abs Immature Granulocytes: 0.02 10*3/uL (ref 0.00–0.07)
Basophils Absolute: 0 10*3/uL (ref 0.0–0.1)
Basophils Relative: 0 %
Eosinophils Absolute: 0.1 10*3/uL (ref 0.0–0.5)
Eosinophils Relative: 1 %
Immature Granulocytes: 0 %
Lymphocytes Relative: 18 %
Lymphs Abs: 0.9 10*3/uL (ref 0.7–4.0)
Monocytes Absolute: 0.5 10*3/uL (ref 0.1–1.0)
Monocytes Relative: 11 %
Neutro Abs: 3.5 10*3/uL (ref 1.7–7.7)
Neutrophils Relative %: 70 %

## 2019-04-17 LAB — URINE CULTURE: Culture: <10000 — AB

## 2019-04-17 LAB — PROCALCITONIN: Procalcitonin: 0.1 ng/mL

## 2019-04-17 LAB — C-REACTIVE PROTEIN: CRP: 13.5 mg/dL — ABNORMAL HIGH (ref <1.0)

## 2019-04-17 LAB — HEMOGLOBIN A1C
Hgb A1c MFr Bld: 6.8 % — ABNORMAL HIGH (ref 4.8–5.6)
Mean Plasma Glucose: 148.46 mg/dL

## 2019-04-17 LAB — APTT: aPTT: 30 seconds (ref 24–36)

## 2019-04-17 LAB — D-DIMER, QUANTITATIVE: D-Dimer, Quant: 13.53 ug/mL-FEU — ABNORMAL HIGH (ref 0.00–0.50)

## 2019-04-17 LAB — CBG MONITORING, ED: Glucose-Capillary: 141 mg/dL — ABNORMAL HIGH (ref 70–99)

## 2019-04-17 LAB — ABO/RH: ABO/RH(D): O POS

## 2019-04-17 LAB — FERRITIN: Ferritin: 487 ng/mL — ABNORMAL HIGH (ref 24–336)

## 2019-04-17 SURGERY — IR WITH ANESTHESIA
Anesthesia: Choice

## 2019-04-17 MED ORDER — EPTIFIBATIDE 20 MG/10ML IV SOLN
INTRAVENOUS | Status: AC
  Filled 2019-04-17: qty 10

## 2019-04-17 MED ORDER — THIAMINE HCL 100 MG/ML IJ SOLN
100.0000 mg | Freq: Every day | INTRAMUSCULAR | Status: DC
  Administered 2019-04-17 – 2019-04-25 (×9): 100 mg via INTRAVENOUS
  Filled 2019-04-17 (×9): qty 2

## 2019-04-17 MED ORDER — STROKE: EARLY STAGES OF RECOVERY BOOK
Freq: Once | Status: DC
  Filled 2019-04-17: qty 1

## 2019-04-17 MED ORDER — CLEVIDIPINE BUTYRATE 0.5 MG/ML IV EMUL
0.0000 mg/h | INTRAVENOUS | Status: DC
  Filled 2019-04-17: qty 50

## 2019-04-17 MED ORDER — SODIUM CHLORIDE 0.9 % IV SOLN
100.0000 mg | Freq: Every day | INTRAVENOUS | Status: AC
  Administered 2019-04-18 – 2019-04-21 (×4): 100 mg via INTRAVENOUS
  Filled 2019-04-17 (×4): qty 20

## 2019-04-17 MED ORDER — ONDANSETRON HCL 4 MG/2ML IJ SOLN
INTRAMUSCULAR | Status: DC | PRN
  Administered 2019-04-17: 4 mg via INTRAVENOUS

## 2019-04-17 MED ORDER — ROCURONIUM BROMIDE 50 MG/5ML IV SOSY
PREFILLED_SYRINGE | INTRAVENOUS | Status: DC | PRN
  Administered 2019-04-17: 20 mg via INTRAVENOUS
  Administered 2019-04-17: 50 mg via INTRAVENOUS
  Administered 2019-04-17: 10 mg via INTRAVENOUS

## 2019-04-17 MED ORDER — CHLORHEXIDINE GLUCONATE 0.12% ORAL RINSE (MEDLINE KIT)
15.0000 mL | Freq: Two times a day (BID) | OROMUCOSAL | Status: DC
  Administered 2019-04-17 – 2019-04-25 (×16): 15 mL via OROMUCOSAL

## 2019-04-17 MED ORDER — ACETAMINOPHEN 325 MG PO TABS: 650.0000 mg | ORAL_TABLET | ORAL | Status: DC | PRN

## 2019-04-17 MED ORDER — ENOXAPARIN SODIUM 40 MG/0.4ML ~~LOC~~ SOLN
40.0000 mg | SUBCUTANEOUS | Status: DC
  Administered 2019-04-18: 40 mg via SUBCUTANEOUS
  Filled 2019-04-17 (×2): qty 0.4

## 2019-04-17 MED ORDER — ASCORBIC ACID 500 MG PO TABS
500.0000 mg | ORAL_TABLET | Freq: Every day | ORAL | Status: DC
  Administered 2019-04-19 – 2019-04-25 (×7): 500 mg
  Filled 2019-04-17 (×7): qty 1

## 2019-04-17 MED ORDER — ZINC SULFATE 220 (50 ZN) MG PO CAPS
220.0000 mg | ORAL_CAPSULE | Freq: Every day | ORAL | Status: DC
  Administered 2019-04-19 – 2019-04-25 (×7): 220 mg
  Filled 2019-04-17 (×7): qty 1

## 2019-04-17 MED ORDER — ORAL CARE MOUTH RINSE
15.0000 mL | OROMUCOSAL | Status: DC
  Administered 2019-04-17 – 2019-04-20 (×23): 15 mL via OROMUCOSAL

## 2019-04-17 MED ORDER — SODIUM CHLORIDE (PF) 0.9 % IJ SOLN
INTRAVENOUS | Status: AC | PRN
  Administered 2019-04-17 (×6): 25 ug via INTRA_ARTERIAL

## 2019-04-17 MED ORDER — NITROGLYCERIN 1 MG/10 ML FOR IR/CATH LAB
INTRA_ARTERIAL | Status: AC
  Filled 2019-04-17: qty 10

## 2019-04-17 MED ORDER — CEFAZOLIN SODIUM-DEXTROSE 2-4 GM/100ML-% IV SOLN
INTRAVENOUS | Status: AC
  Filled 2019-04-17: qty 100

## 2019-04-17 MED ORDER — FENTANYL CITRATE (PF) 100 MCG/2ML IJ SOLN: 25.0000 ug | INTRAMUSCULAR | Status: DC | PRN

## 2019-04-17 MED ORDER — SUCCINYLCHOLINE CHLORIDE 200 MG/10ML IV SOSY
PREFILLED_SYRINGE | INTRAVENOUS | Status: DC | PRN
  Administered 2019-04-17: 120 mg via INTRAVENOUS

## 2019-04-17 MED ORDER — ACETAMINOPHEN 160 MG/5ML PO SOLN
650.0000 mg | ORAL | Status: DC | PRN
  Administered 2019-04-19: 650 mg

## 2019-04-17 MED ORDER — PHENYLEPHRINE HCL-NACL 10-0.9 MG/250ML-% IV SOLN
0.0000 ug/min | INTRAVENOUS | Status: DC
  Administered 2019-04-17: 30 ug/min via INTRAVENOUS
  Administered 2019-04-17: 20 ug/min via INTRAVENOUS
  Administered 2019-04-18 (×2): 30 ug/min via INTRAVENOUS
  Filled 2019-04-17 (×3): qty 250

## 2019-04-17 MED ORDER — FOLIC ACID 5 MG/ML IJ SOLN
1.0000 mg | Freq: Every day | INTRAMUSCULAR | Status: DC
  Administered 2019-04-17 – 2019-04-25 (×9): 1 mg via INTRAVENOUS
  Filled 2019-04-17 (×10): qty 0.2

## 2019-04-17 MED ORDER — PROPOFOL 500 MG/50ML IV EMUL
INTRAVENOUS | Status: DC | PRN
  Administered 2019-04-17: 40 ug/kg/min via INTRAVENOUS

## 2019-04-17 MED ORDER — INSULIN ASPART 100 UNIT/ML ~~LOC~~ SOLN
0.0000 [IU] | SUBCUTANEOUS | Status: DC
  Administered 2019-04-17: 3 [IU] via SUBCUTANEOUS

## 2019-04-17 MED ORDER — ACETAMINOPHEN 650 MG RE SUPP: 650.0000 mg | RECTAL | Status: DC | PRN

## 2019-04-17 MED ORDER — CHLORHEXIDINE GLUCONATE CLOTH 2 % EX PADS
6.0000 | MEDICATED_PAD | Freq: Every day | CUTANEOUS | Status: DC
  Administered 2019-04-18: 11:00:00 6 via TOPICAL

## 2019-04-17 MED ORDER — DEXAMETHASONE SODIUM PHOSPHATE 10 MG/ML IJ SOLN
6.0000 mg | INTRAMUSCULAR | Status: DC
  Administered 2019-04-17 – 2019-04-21 (×5): 6 mg via INTRAVENOUS
  Filled 2019-04-17 (×5): qty 1

## 2019-04-17 MED ORDER — SENNOSIDES-DOCUSATE SODIUM 8.6-50 MG PO TABS: 1.0000 | ORAL_TABLET | Freq: Every evening | ORAL | Status: DC | PRN

## 2019-04-17 MED ORDER — PROPOFOL 1000 MG/100ML IV EMUL
INTRAVENOUS | Status: AC
  Administered 2019-04-17: 50 ug/kg/min via INTRAVENOUS
  Filled 2019-04-17: qty 100

## 2019-04-17 MED ORDER — FAMOTIDINE IN NACL 20-0.9 MG/50ML-% IV SOLN
20.0000 mg | Freq: Two times a day (BID) | INTRAVENOUS | Status: DC
  Administered 2019-04-17: 20 mg via INTRAVENOUS
  Filled 2019-04-17: qty 50

## 2019-04-17 MED ORDER — IOHEXOL 300 MG/ML  SOLN
150.0000 mL | Freq: Once | INTRAMUSCULAR | Status: AC | PRN
  Administered 2019-04-17: 75 mL via INTRA_ARTERIAL

## 2019-04-17 MED ORDER — SODIUM CHLORIDE 0.9% FLUSH
3.0000 mL | Freq: Once | INTRAVENOUS | Status: AC
  Administered 2019-04-17: 3 mL via INTRAVENOUS

## 2019-04-17 MED ORDER — LACTATED RINGERS IV SOLN: INTRAVENOUS | Status: DC | PRN

## 2019-04-17 MED ORDER — PROPOFOL 1000 MG/100ML IV EMUL
0.0000 ug/kg/min | INTRAVENOUS | Status: DC
  Administered 2019-04-17: 40 ug/kg/min via INTRAVENOUS
  Filled 2019-04-17: qty 100

## 2019-04-17 MED ORDER — LIDOCAINE HCL (CARDIAC) PF 100 MG/5ML IV SOSY
PREFILLED_SYRINGE | INTRAVENOUS | Status: DC | PRN
  Administered 2019-04-17: 60 mg via INTRAVENOUS

## 2019-04-17 MED ORDER — SODIUM CHLORIDE 0.9 % IV SOLN: INTRAVENOUS | Status: DC

## 2019-04-17 MED ORDER — HYDRALAZINE HCL 20 MG/ML IJ SOLN: 10.0000 mg | INTRAMUSCULAR | Status: DC | PRN

## 2019-04-17 MED ORDER — SODIUM CHLORIDE 0.9 % IV SOLN
200.0000 mg | Freq: Once | INTRAVENOUS | Status: AC
  Administered 2019-04-17: 200 mg via INTRAVENOUS
  Filled 2019-04-17: qty 200

## 2019-04-17 MED ORDER — ACETAMINOPHEN 160 MG/5ML PO SOLN
650.0000 mg | ORAL | Status: DC | PRN
  Filled 2019-04-17: qty 20.3

## 2019-04-17 MED ORDER — CEFAZOLIN SODIUM-DEXTROSE 2-3 GM-%(50ML) IV SOLR
INTRAVENOUS | Status: DC | PRN
  Administered 2019-04-17: 2 g via INTRAVENOUS

## 2019-04-17 MED ORDER — IOHEXOL 350 MG/ML SOLN
100.0000 mL | Freq: Once | INTRAVENOUS | Status: AC | PRN
  Administered 2019-04-17: 100 mL via INTRAVENOUS

## 2019-04-17 MED ORDER — PHENYLEPHRINE HCL-NACL 10-0.9 MG/250ML-% IV SOLN
INTRAVENOUS | Status: DC | PRN
  Administered 2019-04-17: 25 ug/min via INTRAVENOUS

## 2019-04-17 MED ORDER — PROPOFOL 10 MG/ML IV BOLUS
INTRAVENOUS | Status: DC | PRN
  Administered 2019-04-17: 80 mg via INTRAVENOUS
  Administered 2019-04-17: 20 mg via INTRAVENOUS

## 2019-04-17 MED ORDER — IOHEXOL 300 MG/ML  SOLN: 50.0000 mL | Freq: Once | INTRAMUSCULAR | Status: DC | PRN

## 2019-04-17 NOTE — Procedures (Signed)
S/P RT common carotid arteriogram  Followed by near complete revascularization of occcluded RT MCA M1 seg with x 1 pass with 8mmx 40 mm solitaire X  retriever and x 1 pass with 42mm x 32 mm Trevoprovue retriever and penumbra aspiration achieving a TICI 2C revascularization.. Immediate post  Treatment CT demonstrates hetergeneous hyperdensity in the RT temporal region ,core tissue noted on the MRRI and CT perfusion studies.No mass effect.  RT groin hemostasis with manual compression.Patient left intubated because of covid. Distal pulses palpable DP and PT bilaterally. D/W daughter. S.Sarah Baez MD.

## 2019-04-17 NOTE — Progress Notes (Signed)
Patient ID: Henry [name], male   DOB: 06-20-1940, 79 y.o.   MRN: [id number]. INR 79Y RT H M LSW 112MN 2/10 MRSS 0 . New onset RT sided weakness and aphasia with RT gaze deviation.. CT brain NO ICH . ASPECTS ?6 ?4. CTA occluded rt mca PROX.  CTP core of 35ml ?? And mismatch of 137 ml. MRI DWI /FLAIR mismatch. Endovascular treatment  Discussed with daughter and souse in a 3 way call . Procedure,reasons and alternatives reviewed. Risks of ICH of 10 % with worsening neuro function,death and inability to revascularize all discussed.  It was stressed that the treatment was to preserve brain tissue at risk of dying ,and resulting in further neuro injury. They expressed understanding and provided consent to proceed. S.Omkar Stratmann MD S.Durenda Pechacek MD

## 2019-04-17 NOTE — Progress Notes (Signed)
SLP Cancellation Note  Patient Details Name: Henry [name] MRN: [id number] DOB: [birth year]   Cancelled treatment:       Reason Eval/Treat Not Completed: Patient not medically ready. Per chart pt intubated. Will f/u for needs when appropriate   Rosalia Mcavoy, [name] 04/17/2019, 2:29 PM

## 2019-04-17 NOTE — ED Triage Notes (Signed)
Per [facility]: Pt from home, seen yesterday in ED for generalized weakness and loss of appetite. Pt is COVID +. Pt left AMA at 8 pm. Pts neurologcal symptoms started possibly at 9 PM after he left AMA. Today pt is having left arm flaccid, left leg weak and flaccid, left sided facial droop. Pt is orientede to person, place, event, disoriented to time which is not baseline per family. Pt also slurred speech, gaze up and to the right. Pt had a 20 g IV in the left AC placed by EMS that infiltrated, also had a 20 g IV in the right AC. Pt given 1,000 ml of saline with EMS.

## 2019-04-17 NOTE — Code Documentation (Signed)
Stroke Response Nurse Documentation Code Documentation  Henry [name] is a 79 y.o. male arriving to [facility] ED via [facility] on 04/17/19. Code stroke was activated by EMS. Patient from home where he was originally believed to be LKW 2/9 at 1200 by EMS and now is complaining of left sided weakness, facial droop, and gaze preferance. He was in the ED yesterday complaining of generalized weakness. Diagnosed COVID + (symptomatic 10 days ago). ABX given and patient refused to be admitted. Stroke team at the bedside on patient arrival to ED today. Labs drawn and patient cleared for CT by Dr. Colin Broach. Patient to CT with team. 20G PIV placed by EMS reported to be infiltrated. NIHSS 16, see documentation for details and code stroke times. Patient with right gaze preference (can cross midline), left facial droop, left arm flaccid, left leg drift, sensation absent on the left, and neglect on exam. VAN +. CT completed and negative for hemorrhage. Patient is not a candidate for tPA due to out of the window. New PIV placed for CTA head and neck and CTP. M1 right MCA noted on scan. Groin shaved and pedal pulses marked. Stat MRI ordered as CTP showed 39ml d/t ASPECTS of 4 on CT head. Dr. Lorraine Lax believed core was underestimated. Arrived to MRI 1040. DWI flair completed. Patient taken to bay 8. Dr. Lorraine Lax able to contact family and clarify the LKW was 2/9 2130. Patient's wife assisted him to the recliner at that time. Wife reports he fell around 0200 2/10. She called EMS later and EMS noted stroke like symptoms. Decision for IR made while patient in bay 8. Bedside handoff with IR RN. Patient intubated by anesthesia and patient taken to procedure room. Patient to be admitted by stroke team to [facility] ICU. Discussed plan of care post IR with 53M ICU charge nurse.   Leverne Humbles  Stroke Response RN

## 2019-04-17 NOTE — Telephone Encounter (Signed)
Called to discuss with patient about Covid symptoms and the use of bamlanivimab, a monoclonal antibody infusion for those with mild to moderate Covid symptoms and at high risk of hospitalization.   Pt appears to qualify for this infusion at [facility] infusion center due to co-morbid conditions and/or member of at risk group.  Unable to reach pt. Left message on wife's phone as she also seems to qualify for infusion. Pt's voicemail is not set up.   Alan Ripper, NP-C [place]

## 2019-04-17 NOTE — ED Provider Notes (Signed)
[facility] EMERGENCY DEPARTMENT Provider Note   CSN: [id number] Arrival date & time: 04/17/19  1001  An emergency department physician performed an initial assessment on this suspected stroke patient at 1007.  History No chief complaint on file.   Henry [name] is a 79 y.o. male.  HPI Patient presents as a code stroke.  Had been seen in the ER yesterday for generalized weakness.  Found to be Covid positive and admission has been recommended but patient went home about 8:00 last night.  Today had been found with left arm left leg flaccid with left-sided facial droop.  Some confusion also.  Reported gaze disturbance to the right.    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   BPH (benign prostatic hyperplasia) 12/01/2015   Heel pain 05/13/2015   Inguinal hernia 03/19/2014   Left hip pain 01/10/2014   HTN (hypertension) 01/10/2014   Well adult exam 09/12/2011   Personal history of colonic polyps 06/07/2010   GERD 03/16/2010   COUGH 08/11/2009   POLYCYTHEMIA 07/01/2009   Osteoarthritis 04/17/2007   LOW BACK PAIN 04/17/2007   LEG PAIN 04/17/2007   PARESTHESIA 04/17/2007    Past Surgical History:  Procedure Laterality Date   APPENDECTOMY     COLONOSCOPY     HERNIA REPAIR     march 2019   INGUINAL HERNIA REPAIR Left 2015   LUMBAR LAMINECTOMY/DECOMPRESSION  MICRODISCECTOMY N/A 05/20/2016   Procedure: Lumbar Three-Four,Lunmbar Four-Five,Lumbar Five-Sacral One Laminectomy/Foraminotomy;  Surgeon: Kristeen Miss, MD;  Location: [facility];  Service: Neurosurgery;  Laterality: N/A;   POLYPECTOMY         Family History  Problem Relation Age of Onset   Colon cancer Sister 70   Breast cancer Other        1st degree relative   Stroke Neg Hx    Esophageal cancer Neg Hx    Pancreatic cancer Neg Hx    Prostate cancer Neg Hx    Rectal cancer Neg Hx    Stomach cancer Neg Hx     Social History   Tobacco Use   Smoking status: Former Smoker    Packs/day: 1.00    Types: Cigarettes    Quit date: 05/24/1976    Years since quitting: 42.9   Smokeless tobacco: Never Used  Substance Use Topics   Alcohol use: No   Drug use: No    Home Medications Prior to Admission medications   Medication Sig Start Date End Date Taking? Authorizing Provider  cholecalciferol (VITAMIN D) 1000 units tablet Take 1,000 Units by mouth daily.   Yes [provider]  Probiotic Product (ALIGN PO) Take 1 capsule by mouth daily.   Yes [provider]  Misc Natural Products (PROSTATE HEALTH) CAPS Take 1 capsule by mouth daily.    [provider]  Multiple Vitamins-Minerals (SUPER MEGA VITE 75/BETA CARO PO) Take 1  tablet by mouth 2 (two) times daily.    [provider]  naproxen sodium (ALEVE) 220 MG tablet Take 220 mg by mouth as needed.    [provider]    Allergies    No known allergies  Review of Systems   Review of Systems  Unable to perform ROS: Mental status change    Physical Exam Updated Vital Signs BP 119/67    Pulse (!) 50    Temp 98.7 F (37.1 C) (Oral)    Resp 16    SpO2 100%   Physical Exam Vitals reviewed.  HENT:     Head: Normocephalic.     Mouth/Throat:     Mouth: Mucous membranes are moist.  Cardiovascular:     Rate and Rhythm: Regular rhythm.  Pulmonary:     Breath sounds: No wheezing,  rhonchi or rales.  Abdominal:     Tenderness: There is no abdominal tenderness.  Musculoskeletal:        General: No tenderness.     Cervical back: Neck supple.  Skin:    General: Skin is warm.     Capillary Refill: Capillary refill takes less than 2 seconds.  Neurological:     Mental Status: He is alert.     Comments: Gaze primary to right but will cross the left.  Flaccid on left upper extremity.  Good strength in right upper extremity.  Decreased left lower extremity compared to right lower extremity.  Some difficulty naming objects.  Complete NIH scoring done by neurology.     ED Results / Procedures / Treatments   Labs (all labs ordered are listed, but only abnormal results are displayed) Labs Reviewed  CBC - Abnormal; Notable for the following components:      Result Value   RBC 3.45 (*)    Hemoglobin 12.7 (*)    HCT 37.8 (*)    MCV 109.6 (*)    MCH 36.8 (*)    Platelets 130 (*)    All other components within normal limits  COMPREHENSIVE METABOLIC PANEL - Abnormal; Notable for the following components:   CO2 21 (*)    Glucose, Bld 158 (*)    Calcium 8.5 (*)    Total Protein 6.0 (*)    Albumin 2.5 (*)    All other components within normal limits  I-STAT CHEM 8, ED - Abnormal; Notable for the following components:   Glucose, Bld 150 (*)    Calcium, Ion 1.09 (*)    Hemoglobin 12.6 (*)    HCT 37.0 (*)    All other components within normal limits  CBG MONITORING, ED - Abnormal; Notable for the following components:   Glucose-Capillary 141 (*)    All other components within normal limits  POCT I-STAT 7, (LYTES, BLD GAS, ICA,H+H) - Abnormal; Notable for the following components:   pO2, Arterial 449.0 (*)    Calcium, Ion 1.13 (*)    HCT 32.0 (*)    Hemoglobin 10.9 (*)    All other components within normal limits  CULTURE, RESPIRATORY  DIFFERENTIAL  PROTIME-INR  APTT  C-REACTIVE PROTEIN  D-DIMER, QUANTITATIVE (NOT AT [facility])  FERRITIN  PROCALCITONIN  BLOOD GAS,  ARTERIAL  HEMOGLOBIN A1C  ABO/RH    EKG None  Radiology CT ANGIO HEAD W OR WO CONTRAST  Result Date: 04/17/2019 CLINICAL DATA:  Stroke, follow-up. EXAM: CT ANGIOGRAPHY HEAD AND NECK CT PERFUSION BRAIN TECHNIQUE: Multidetector CT imaging of the head and neck was performed using the standard protocol during bolus  administration of intravenous contrast. Multiplanar CT image reconstructions and MIPs were obtained to evaluate the vascular anatomy. Carotid stenosis measurements (when applicable) are obtained utilizing NASCET criteria, using the distal internal carotid diameter as the denominator. Multiphase CT imaging of the brain was performed following IV bolus contrast injection. Subsequent parametric perfusion maps were calculated using RAPID software. CONTRAST:  147m OMNIPAQUE IOHEXOL 350 MG/ML SOLN COMPARISON:  Noncontrast head CT performed earlier the same day. FINDINGS: CTA NECK FINDINGS Aortic arch: The origins of the innominate and left common carotid arteries are incompletely imaged. The visualized aortic arch is unremarkable. No significant innominate or proximal subclavian stenosis visualized. Right carotid system: CCA patent to the bifurcation without stenosis. Prominent predominantly calcified plaque within the distal common carotid artery and proximal ICA. There is likely greater than 70% stenosis at the origin of the ICA, although exact quantification of stenosis is difficult due to irregularity of calcified plaque. Distal to this the ICA is patent within the neck without stenosis. Left carotid system: CCA patent to the bifurcation without stenosis. Calcified plaque within the distal common carotid artery, carotid bifurcation and proximal ICA. Resultant 30-40% narrowing of the proximal ICA as compared to the more distal vessel. Distal to this the ICA is patent within the neck without stenosis. Vertebral arteries: Right vertebral artery dominant. 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  Calcified plaque at the origin of the medial V1 left vertebral artery branch with at least moderate ostial stenosis. More distally the bilateral vertebral arteries are patent within the neck without stenosis. Skeleton: No acute bony abnormality. Cervical spondylosis with C5-C6 and C6-C7 posterior disc osteophytes. Other neck: No neck mass or cervical adenopathy. Upper chest: Paraseptal emphysema within the imaged lung apices. Also within the imaged lung apices but there is interstitial prominence suspicious for interstitial lung disease. There are more nodular opacities within the right upper lobe measuring up to 1.4 cm (series 7, image 56) (series 7, image 99). Review of the MIP images confirms the above findings CTA HEAD FINDINGS Anterior circulation: The intracranial internal carotid arteries are patent with scattered calcified plaque. No more than mild stenosis within these vessels. 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 There is minimal reconstitution of flow seen within M2 and more distal right MCA branch vessels. The left middle cerebral artery is patent without high-grade proximal stenosis. The bilateral anterior cerebral arteries are patent without significant proximal stenosis. Posterior circulation: The intracranial vertebral arteries are patent without significant stenosis, as is the basilar artery. The bilateral posterior cerebral arteries are patent without significant proximal stenosis. A sizable right posterior communicating artery is present. The a left posterior communicating artery is poorly delineated and may be hypoplastic or absent. Venous sinuses: Within limitations of contrast timing, no convincing thrombus. Anatomic variants: As described Review of the MIP images confirms the above findings CT Brain Perfusion Findings: ASPECTS: 4 CBF (<30%) Volume: 17m(in the right MCA vascular territory) Perfusion (Tmax>6.0s) volume: 147 mL (in the right MCA vascular territory) Mismatch  Volume: [id number] Location:Right MCA vascular territory Findings of a proximal M1 right MCA occlusion and the perfusion results were discussed by telephone at the time of interpretation on 04/17/2019 at 10:38 am to provider Dr. AroLorraine Laxho verbally acknowledged these results. IMPRESSION: CTA neck: 1. The common and internal carotid arteries are patent within the neck bilaterally. 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 There is likely greater than 70% stenosis at the origin of the right ICA. 30-40% stenosis  of the proximal left ICA. 2. The vertebral arteries are patent within the neck bilaterally. The right vertebral artery is dominant with moderate atherosclerotic narrowing at its origin. Duplicated V1 left vertebral artery. There is at least moderate ostial stenosis at the origin of the medial left V1 vertebral artery branch. 3. The partially imaged lung apices demonstrate findings suspicious for interstitial lung disease. Additionally, there are nodular opacities within the right lung apex measuring up to 1.4 cm. Dedicated chest CT is recommended for further evaluation when clinically feasible. CTA head: 1. 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 There is minimal reconstitution of flow within M2 and more distal right MCA branch vessels. 2. No other intracranial large vessel occlusion or proximal high-grade arterial stenosis identified. CT perfusion head: The perfusion software identifies a 10 mL core infarct within the right MCA vascular territory. However, please note more extensive infarction changes were appreciated on concurrently performed noncontrast head CT. The perfusion software detects a 147 mL region of hypoperfused parenchyma within the right MCA vascular territory. Reported mismatch 137 mL. Electronically Signed   By: Kellie Simmering DO   On: 04/17/2019 11:04   CT ANGIO NECK W OR WO CONTRAST  Result Date: 04/17/2019 CLINICAL DATA:  Stroke, follow-up.  EXAM: CT ANGIOGRAPHY HEAD AND NECK CT PERFUSION BRAIN TECHNIQUE: Multidetector CT imaging of the head and neck was performed using the standard protocol during bolus administration of intravenous contrast. Multiplanar CT image reconstructions and MIPs were obtained to evaluate the vascular anatomy. Carotid stenosis measurements (when applicable) are obtained utilizing NASCET criteria, using the distal internal carotid diameter as the denominator. Multiphase CT imaging of the brain was performed following IV bolus contrast injection. Subsequent parametric perfusion maps were calculated using RAPID software. CONTRAST:  137m OMNIPAQUE IOHEXOL 350 MG/ML SOLN COMPARISON:  Noncontrast head CT performed earlier the same day. FINDINGS: CTA NECK FINDINGS Aortic arch: The origins of the innominate and left common carotid arteries are incompletely imaged. The visualized aortic arch is unremarkable. No significant innominate or proximal subclavian stenosis visualized. Right carotid system: CCA patent to the bifurcation without stenosis. Prominent predominantly calcified plaque within the distal common carotid artery and proximal ICA. There is likely greater than 70% stenosis at the origin of the ICA, although exact quantification of stenosis is difficult due to irregularity of calcified plaque. Distal to this the ICA is patent within the neck without stenosis. Left carotid system: CCA patent to the bifurcation without stenosis. Calcified plaque within the distal common carotid artery, carotid bifurcation and proximal ICA. Resultant 30-40% narrowing of the proximal ICA as compared to the more distal vessel. Distal to this the ICA is patent within the neck without stenosis. Vertebral arteries: Right vertebral artery dominant. 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 Calcified plaque at the origin of the medial V1 left vertebral artery branch with at least moderate ostial stenosis. More distally the bilateral vertebral  arteries are patent within the neck without stenosis. Skeleton: No acute bony abnormality. Cervical spondylosis with C5-C6 and C6-C7 posterior disc osteophytes. Other neck: No neck mass or cervical adenopathy. Upper chest: Paraseptal emphysema within the imaged lung apices. Also within the imaged lung apices but there is interstitial prominence suspicious for interstitial lung disease. There are more nodular opacities within the right upper lobe measuring up to 1.4 cm (series 7, image 56) (series 7, image 99). Review of the MIP images confirms the above findings CTA HEAD FINDINGS Anterior circulation: The intracranial internal carotid arteries are patent  with scattered calcified plaque. No more than mild stenosis within these vessels. 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 There is minimal reconstitution of flow seen within M2 and more distal right MCA branch vessels. The left middle cerebral artery is patent without high-grade proximal stenosis. The bilateral anterior cerebral arteries are patent without significant proximal stenosis. Posterior circulation: The intracranial vertebral arteries are patent without significant stenosis, as is the basilar artery. The bilateral posterior cerebral arteries are patent without significant proximal stenosis. A sizable right posterior communicating artery is present. The a left posterior communicating artery is poorly delineated and may be hypoplastic or absent. Venous sinuses: Within limitations of contrast timing, no convincing thrombus. Anatomic variants: As described Review of the MIP images confirms the above findings CT Brain Perfusion Findings: ASPECTS: 4 CBF (<30%) Volume: 24m (in the right MCA vascular territory) Perfusion (Tmax>6.0s) volume: 147 mL (in the right MCA vascular territory) Mismatch Volume: [id number] Location:Right MCA vascular territory Findings of a proximal M1 right MCA occlusion and the perfusion results were discussed by  telephone at the time of interpretation on 04/17/2019 at 10:38 am to provider Dr. ArLorraine Laxwho verbally acknowledged these results. IMPRESSION: CTA neck: 1. The common and internal carotid arteries are patent within the neck bilaterally. 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 There is likely greater than 70% stenosis at the origin of the right ICA. 30-40% stenosis of the proximal left ICA. 2. The vertebral arteries are patent within the neck bilaterally. The right vertebral artery is dominant with moderate atherosclerotic narrowing at its origin. Duplicated V1 left vertebral artery. There is at least moderate ostial stenosis at the origin of the medial left V1 vertebral artery branch. 3. The partially imaged lung apices demonstrate findings suspicious for interstitial lung disease. Additionally, there are nodular opacities within the right lung apex measuring up to 1.4 cm. Dedicated chest CT is recommended for further evaluation when clinically feasible. CTA head: 1. 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 There is minimal reconstitution of flow within M2 and more distal right MCA branch vessels. 2. No other intracranial large vessel occlusion or proximal high-grade arterial stenosis identified. CT perfusion head: The perfusion software identifies a 10 mL core infarct within the right MCA vascular territory. However, please note more extensive infarction changes were appreciated on concurrently performed noncontrast head CT. The perfusion software detects a 147 mL region of hypoperfused parenchyma within the right MCA vascular territory. Reported mismatch 137 mL. Electronically Signed   By: KyKellie SimmeringO   On: 04/17/2019 11:04   MR BRAIN WO CONTRAST  Result Date: 04/17/2019 CLINICAL DATA:  Focal neuro deficit, greater than 6 hours, stroke suspected. EXAM: MRI HEAD WITHOUT CONTRAST TECHNIQUE: Multiplanar, multiecho pulse sequences of the brain and surrounding structures  were obtained without intravenous contrast. COMPARISON:  Noncontrast CT head, CT angiogram head/neck and CT perfusion performed earlier the same day 04/17/2019 FINDINGS: Brain: A limited protocol brain MRI was performed at the ordering provider's request consisting of only axial diffusion-weighted imaging and axial T2 FLAIR imaging. There is extensive multifocal restricted diffusion within the right MCA vascular territory, consistent with acute ischemic changes and involving portions of the right frontal, parietal, temporal and occipital lobes as well as right insular cortex, posterior limb of right internal capsule, right lentiform nucleus, right caudate body and right corona radiata. Of note, this includes acute ischemic change within the right motor strip. The largest region of diffusion weighted signal abnormality  within the right temporal lobe measures 7.8 x 3.0 cm in transaxial dimensions (series 4, image 23). There is T2/FLAIR hyperintensity which corresponds with some, but not all, of the acute ischemic change. For instance, there is little to no T2/FLAIR hyperintensity corresponding with the ischemic changes within the right corona radiata, basal ganglia and right insula at this time. Punctate acute cortical infarct within the left parietooccipital lobe (series 4, image 33). Additional probable punctate acute infarcts more superiorly within the left parietal lobe (series 4, image 35) and within the lateral left occipital lobe (series 4, image 24). Vascular: Poorly assessed on the acquired sequences. Skull and upper cervical spine: No evidence of focal marrow lesion on the acquired sequences. Sinuses/Orbits: Visualized orbits demonstrate no acute abnormality. Minimal ethmoid sinus mucosal thickening. No significant mastoid effusion. IMPRESSION: Extensive restricted diffusion consistent with acute ischemia involving the right MCA vascular territory. There is T2/FLAIR hyperintensity which corresponds with  some, but not all, of the acute ischemic changes as described. Additional punctate acute infarcts within the left parietal and occipital lobes. Electronically Signed   By: Kellie Simmering DO   On: 04/17/2019 12:50   CT CEREBRAL PERFUSION W CONTRAST  Result Date: 04/17/2019 CLINICAL DATA:  Stroke, follow-up. EXAM: CT ANGIOGRAPHY HEAD AND NECK CT PERFUSION BRAIN TECHNIQUE: Multidetector CT imaging of the head and neck was performed using the standard protocol during bolus administration of intravenous contrast. Multiplanar CT image reconstructions and MIPs were obtained to evaluate the vascular anatomy. Carotid stenosis measurements (when applicable) are obtained utilizing NASCET criteria, using the distal internal carotid diameter as the denominator. Multiphase CT imaging of the brain was performed following IV bolus contrast injection. Subsequent parametric perfusion maps were calculated using RAPID software. CONTRAST:  117m OMNIPAQUE IOHEXOL 350 MG/ML SOLN COMPARISON:  Noncontrast head CT performed earlier the same day. FINDINGS: CTA NECK FINDINGS Aortic arch: The origins of the innominate and left common carotid arteries are incompletely imaged. The visualized aortic arch is unremarkable. No significant innominate or proximal subclavian stenosis visualized. Right carotid system: CCA patent to the bifurcation without stenosis. Prominent predominantly calcified plaque within the distal common carotid artery and proximal ICA. There is likely greater than 70% stenosis at the origin of the ICA, although exact quantification of stenosis is difficult due to irregularity of calcified plaque. Distal to this the ICA is patent within the neck without stenosis. Left carotid system: CCA patent to the bifurcation without stenosis. Calcified plaque within the distal common carotid artery, carotid bifurcation and proximal ICA. Resultant 30-40% narrowing of the proximal ICA as compared to the more distal vessel. Distal to this  the ICA is patent within the neck without stenosis. Vertebral arteries: Right vertebral artery dominant. 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 Calcified plaque at the origin of the medial V1 left vertebral artery branch with at least moderate ostial stenosis. More distally the bilateral vertebral arteries are patent within the neck without stenosis. Skeleton: No acute bony abnormality. Cervical spondylosis with C5-C6 and C6-C7 posterior disc osteophytes. Other neck: No neck mass or cervical adenopathy. Upper chest: Paraseptal emphysema within the imaged lung apices. Also within the imaged lung apices but there is interstitial prominence suspicious for interstitial lung disease. There are more nodular opacities within the right upper lobe measuring up to 1.4 cm (series 7, image 56) (series 7, image 99). Review of the MIP images confirms the above findings CTA HEAD FINDINGS Anterior circulation: The intracranial internal carotid arteries are patent with scattered calcified plaque. No more  than mild stenosis within these vessels. 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 There is minimal reconstitution of flow seen within M2 and more distal right MCA branch vessels. The left middle cerebral artery is patent without high-grade proximal stenosis. The bilateral anterior cerebral arteries are patent without significant proximal stenosis. Posterior circulation: The intracranial vertebral arteries are patent without significant stenosis, as is the basilar artery. The bilateral posterior cerebral arteries are patent without significant proximal stenosis. A sizable right posterior communicating artery is present. The a left posterior communicating artery is poorly delineated and may be hypoplastic or absent. Venous sinuses: Within limitations of contrast timing, no convincing thrombus. Anatomic variants: As described Review of the MIP images confirms the above findings CT Brain Perfusion Findings: ASPECTS:  4 CBF (<30%) Volume: 20m (in the right MCA vascular territory) Perfusion (Tmax>6.0s) volume: 147 mL (in the right MCA vascular territory) Mismatch Volume: [id number] Location:Right MCA vascular territory Findings of a proximal M1 right MCA occlusion and the perfusion results were discussed by telephone at the time of interpretation on 04/17/2019 at 10:38 am to provider Dr. ArLorraine Laxwho verbally acknowledged these results. IMPRESSION: CTA neck: 1. The common and internal carotid arteries are patent within the neck bilaterally. 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 There is likely greater than 70% stenosis at the origin of the right ICA. 30-40% stenosis of the proximal left ICA. 2. The vertebral arteries are patent within the neck bilaterally. The right vertebral artery is dominant with moderate atherosclerotic narrowing at its origin. Duplicated V1 left vertebral artery. There is at least moderate ostial stenosis at the origin of the medial left V1 vertebral artery branch. 3. The partially imaged lung apices demonstrate findings suspicious for interstitial lung disease. Additionally, there are nodular opacities within the right lung apex measuring up to 1.4 cm. Dedicated chest CT is recommended for further evaluation when clinically feasible. CTA head: 1. 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 There is minimal reconstitution of flow within M2 and more distal right MCA branch vessels. 2. No other intracranial large vessel occlusion or proximal high-grade arterial stenosis identified. CT perfusion head: The perfusion software identifies a 10 mL core infarct within the right MCA vascular territory. However, please note more extensive infarction changes were appreciated on concurrently performed noncontrast head CT. The perfusion software detects a 147 mL region of hypoperfused parenchyma within the right MCA vascular territory. Reported mismatch 137 mL. Electronically  Signed   By: KyKellie SimmeringO   On: 04/17/2019 11:04   DG Chest Portable 1 View  Result Date: 04/16/2019 CLINICAL DATA:  Cough and fever. New weakness. EXAM: PORTABLE CHEST 1 VIEW COMPARISON:  08/11/2009 FINDINGS: Normal sized heart. Interval diffuse prominence of the interstitial markings and patchy opacities in the mid and lower lung zones bilaterally. No visible pleural fluid. Diffuse osteo IMPRESSION: Interval probable bilateral pneumonia and pneumonitis. Electronically Signed   By: StClaudie Revering.D.   On: 04/16/2019 14:28   CT HEAD CODE STROKE WO CONTRAST  Result Date: 04/17/2019 CLINICAL DATA:  Code stroke. Left-sided facial droop/weakness, last known well noon yesterday EXAM: CT HEAD WITHOUT CONTRAST TECHNIQUE: Contiguous axial images were obtained from the base of the skull through the vertex without intravenous contrast. COMPARISON:  No pertinent prior studies available for comparison. FINDINGS: Brain: There is multifocal abnormal hypodensity consistent with acute ischemic infarction within the right MCA vascular territory involving the right frontal lobe, right parietal lobe, portions of the  posterior right temporal lobe and right insular cortex. Subtle petechial hemorrhage is questioned within a region of infarction within the anterior right frontal lobe (series 2, image 19). No significant mass effect or midline shift. No extra-axial fluid collection. Background mild ill-defined hypoattenuation within the cerebral white matter is nonspecific, but consistent with chronic small vessel ischemic disease. Mild generalized parenchymal atrophy. Vascular: Abnormal hyperdensity of the M1 right MCA likely reflecting thrombus. Atherosclerotic calcifications. Skull: No calvarial fracture or suspicious osseous lesion. Sinuses/Orbits: Visualized orbits demonstrate no acute abnormality. Mild paranasal sinus mucosal thickening. No significant mastoid effusion. ASPECTS ([place] Stroke Program Early CT Score) -  Ganglionic level infarction (caudate, lentiform nuclei, internal capsule, insula, M1-M3 cortex): 4 (points subtracted for M2, M3 and insula) - Supraganglionic infarction (M4-M6 cortex): 0 Total score (0-10 with 10 being normal): 4 These results were communicated to Dr. Lorraine Lax At 10:38 amon 2/10/2021by text page via the [facility] messaging system. IMPRESSION: 1. 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 ASPECTS 4. Subtle petechial hemorrhage is questioned within an anterior right frontal lobe infarct site. No significant mass effect. 2. Hyperdensity of the M1 right MCA likely reflecting thrombus. 3. Mild generalized parenchymal atrophy and chronic small vessel ischemic disease. Electronically Signed   By: Kellie Simmering DO   On: 04/17/2019 10:38    Procedures Procedures (including critical care time)  Medications Ordered in ED Medications   stroke: mapping our early stages of recovery book (has no administration in time range)  acetaminophen (TYLENOL) 160 MG/5ML solution 650 mg (has no administration in time range)  senna-docusate (Senokot-S) tablet 1 tablet (has no administration in time range)  iohexol (OMNIPAQUE) 300 MG/ML solution 50 mL (has no administration in time range)  acetaminophen (TYLENOL) 160 MG/5ML solution 650 mg (has no administration in time range)  0.9 %  sodium chloride infusion ( Intravenous New Bag/Given 04/17/19 1514)  clevidipine (CLEVIPREX) infusion 0.5 mg/mL (0 mg/hr Intravenous Hold 04/17/19 1549)  remdesivir 200 mg in sodium chloride 0.9% 250 mL IVPB (has no administration in time range)    Followed by  remdesivir 100 mg in sodium chloride 0.9 % 100 mL IVPB (has no administration in time range)  dexamethasone (DECADRON) injection 6 mg (has no administration in time range)  ascorbic acid (VITAMIN C) tablet 500 mg (500 mg Per Tube Not Given 04/17/19 1541)  zinc sulfate capsule 220 mg (220 mg Per Tube Not Given 0/86/57 8469)  folic  acid injection 1 mg (has no administration in time range)  thiamine (B-1) injection 100 mg (has no administration in time range)  chlorhexidine gluconate (MEDLINE KIT) (PERIDEX) 0.12 % solution 15 mL (has no administration in time range)  MEDLINE mouth rinse (15 mLs Mouth Rinse Given 04/17/19 1518)  famotidine (PEPCID) IVPB 20 mg premix (has no administration in time range)  propofol (DIPRIVAN) 1000 MG/100ML infusion (50 mcg/kg/min  81.6 kg Intravenous New Bag/Given 04/17/19 1456)  fentaNYL (SUBLIMAZE) injection 25 mcg (has no administration in time range)  fentaNYL (SUBLIMAZE) injection 25-100 mcg (has no administration in time range)  enoxaparin (LOVENOX) injection 40 mg (has no administration in time range)  hydrALAZINE (APRESOLINE) injection 10-40 mg (has no administration in time range)  phenylephrine (NEOSYNEPHRINE) 10-0.9 MG/250ML-% infusion (20 mcg/min Intravenous New Bag/Given 04/17/19 1517)  insulin aspart (novoLOG) injection 0-20 Units (has no administration in time range)  sodium chloride flush (NS) 0.9 % injection 3 mL (3 mLs Intravenous Given 04/17/19 1515)  iohexol (OMNIPAQUE) 350 MG/ML injection 100 mL (100  mLs Intravenous Contrast Given 04/17/19 1030)  iohexol (OMNIPAQUE) 300 MG/ML solution 150 mL (75 mLs Intra-arterial Contrast Given 04/17/19 1310)  ceFAZolin (ANCEF) 2-4 XY/585FY-% IVPB (  Duplicate 11/28/44 2863)  nitroGLYCERIN 100 mcg/mL intra-arterial injection (  Duplicate 10/21/69 1657)  nitroGLYCERIN 25 mcg in sodium chloride (PF) 0.9 % 59.71 mL (25 mcg/mL) syringe (25 mcg Intra-arterial Given 04/17/19 1302)  nitroGLYCERIN 100 mcg/mL intra-arterial injection (  Duplicate 11/07/81 3383)    ED Course  I have reviewed the triage vital signs and the nursing notes.  Pertinent labs & imaging results that were available during my care of the patient were reviewed by me and considered in my medical decision making (see chart for details).    MDM Rules/Calculators/A&P                       Patient diagnosed with Covid yesterday.  Left yesterday AGAINST MEDICAL ADVICE.  Developed weakness and difficulty speaking.  Appears to have an acute stroke.  Somewhat difficult to determine time of onset but appears that it may have had last normal last night.  CT scan shows abnormalities perfusion MRI done and taken to interventional radiology by neurology.  CRITICAL CARE Performed by: Davonna Belling Total critical care time: 30 minutes Critical care time was exclusive of separately billable procedures and treating other patients. Critical care was necessary to treat or prevent imminent or life-threatening deterioration. Critical care was time spent personally by me on the following activities: development of treatment plan with patient and/or surrogate as well as nursing, discussions with consultants, evaluation of patient's response to treatment, examination of patient, obtaining history from patient or surrogate, ordering and performing treatments and interventions, ordering and review of laboratory studies, ordering and review of radiographic studies, pulse oximetry and re-evaluation of patient's condition.  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

## 2019-04-17 NOTE — Sedation Documentation (Signed)
Right femoral sheath removed. Quick clot applied

## 2019-04-17 NOTE — Progress Notes (Signed)
Dear Doctor:  This patient has been identified as a candidate for PICC for the following reason (s): drug pH or osmolality (causing phlebitis, infiltration in 24 hours)  Very poor veins.  Both arms assessed with ultrasound show very few suitable veins for PIV.   If you agree, please write an order for the indicated device. For any questions contact the Vascular Access Team at [phone] if no answer, please leave a message.  Thank you for supporting the early vascular access assessment program.

## 2019-04-17 NOTE — ED Notes (Signed)
Pt transported to MRI with RN.

## 2019-04-17 NOTE — ED Notes (Signed)
Pt arrived in MRI.

## 2019-04-17 NOTE — H&P (Signed)
Neurology history and physical      NEUROHOSPITALIST ADDENDUM Performed a face to face diagnostic evaluation as time of code stroke.   I have reviewed the contents of history and physical exam as documented by PA/ARNP/Resident. I have discussed and formulated the above plan as documented.   HPI:  79 year old male was otherwise healthy apart from mild hypertension, low back pain, polycythemia not on any medications at home presents to the ED as a code stroke for left-sided weakness and neglect.  Patient has been having flulike symptoms for approximately a week, presented to the emergency department yesterday around 1 PM for generalized weakness, cough and fever into 3 days  but then left after waiting.  Was diagnosed with COVID-19. Per EMS last status 12 PM 04/16/2019, after speaking with the daughter on the phone she states that after patient got back from the ED, she held onto the recliner around 9 to 9:30 PM.  Around 2 AM patient with a chair and fell on the floor.  EMS was called early in the morning, when they went to assess the patient he was weak on the left side had a left facial droop and had a right gaze preference.  LVO stroke scale was 6.  PMH as above SH and FH: please see below ROS: Shortness of breath, fever, limited due to patient mental status   ED course:  On arrival to [facility], patient had NIH Stroke scale of 16.  Patient was immediately taken to CT scanner and a CT head, CT angiogram and CT perfusion were performed.  Stat CT head was obtained which showed early acute ischemic changes in the right MCA territory with aspects of 4.     There was a discrepancy in the findings on CT perfusion which only demonstrated 10 mm infarct versus 147 mL of penumbra with a mismatch of 137 mL.  Due to this, patient was immediately taken to MRI brain, MRI brain showed large area of restriction diffusion with corresponding T2 flair hyperintensities in some but not all of the areas  described.  After reviewing MRI as well as CT perfusion results with interventional neuroradiologist, we did feel there was sufficient penumbra and pursuing with mechanical thrombectomy could potentially improve outcome.  Patient within 24 hours and therefore candidate for thrombectomy. Patient not candidate for TPA as he is outside the window.  Spoke with both daughter and wife over the phone, explained risk versus benefit of the procedure including high risk of hemorrhage.  Family consented to procedure.   Exam: Patient awake, following simple commands.  Stated his name correctly and age.  Incorrectly stated the month.  No blink to threat on left, left homonymous hemianopsia very left facial droop.  Left arm 1 out of 5 strength, left leg 3 out of 5 strength.  Right side normal strength.  Both visual and sensory neglect.   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 until passes swallow eval/PT/OT   Cytotoxic and vasogenic cerebral edema 2/2 large right MCA stroke -CT head in 6 hours -Repeat CT head in 6 hours to assess hemorrhage, size of stroke and evaluate for any midline shift. -We will consider 3% hypertonic saline if patient has midline shift on repeat CT head   Acute respiratory failure secondary to above  and COVID-19 infection - appreciate PCCM assistance   -COVID-19 infection Appreciate PCCM assistance  HTN - BP goal [id number] -[id number] systolic    This patient is neurologically critically ill due to R MCA stroke s/p MT.   He is at risk for significant risk of neurological worsening from cerebral edema,  death from brain herniation, heart failure, hemorrhagic conversion, infection, respiratory  failure and seizure. This patient's care requires constant monitoring of vital signs, hemodynamics, respiratory and cardiac monitoring, review of multiple databases, neurological assessment, discussion with family, other specialists and medical decision making of high complexity.  I spent 85 minutes of neurocritical time in the care of this patient.     Karena Addison [name] MD [facility] [id number]   If 7pm to 7am, please call on call as listed on AMION.               CC: Left-sided weakness  History is obtained from: Daughter/EMS  HPI: Henry [name] is a 79 y.o. male with history of polycythemia, COVID-19.  Patient apparently was in the ED yesterday for generalized weakness and was diagnosed with COVID-19.  At that time he left AMA.  He was at normal baseline at that time. Daughter states that he sat down in his recliner last night between 9-9:30 PM.  At 2 AM it was noticed that patient had left-sided weakness.  This a.m. he was noted to have increased left-sided weakness and EMS was called.  Code stroke was initiated and patient was brought to [facility].  On arrival it was clear that patient had a right-sided gaze and left upper extremity flaccidity.  Patient was immediately brought to CT scan to evaluate for large vessel occlusion as he was out of the window for TPA.  While in CT he was noted to be satting at 80% on room air.  Patient is not on any prescribed medications, he is not on aspirin.  ED course  Relevant labs include -platelets 130, CT head  CTA head neck CT perfusion  Chart review-no chart review for neurology  LKW: Between 9-930 last night on 04/16/2019 tpa given?: no, out of window Premorbid modified Rankin scale (mRS):0 NIH stroke score score: 16   Past Medical History:  Diagnosis Date  . Colon polyps   . Elevated MCV 2011  . GERD (gastroesophageal reflux disease) 2012  . LBP (low back pain)    MRI L spine 06/2007: L L4  stenosis, 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  . Osteoarthritis   . Polycythemia 2011    Family History  Problem Relation Age of Onset  . Colon cancer Sister 61  . Breast cancer Other        1st degree relative  . Stroke Neg Hx   . Esophageal cancer Neg Hx   . Pancreatic cancer Neg Hx   . Prostate cancer Neg Hx   . Rectal cancer Neg Hx   . Stomach cancer Neg Hx    Social History:   reports that he quit smoking about 42 years ago. His smoking use included cigarettes. He smoked 1.00 pack per day. He has never used smokeless tobacco. He reports that he does not drink alcohol or use drugs.  Medications  Current Facility-Administered Medications:  .  sodium chloride flush (NS) 0.9 % injection 3 mL, 3 mL, Intravenous, Once, Davonna Belling, MD  Current Outpatient Medications:  .  cholecalciferol (VITAMIN D) 1000 units tablet, Take 1,000 Units by mouth daily., Disp: , Rfl:  .  Misc Natural Products (  PROSTATE HEALTH) CAPS, Take 1 capsule by mouth daily., Disp: , Rfl:  .  Multiple Vitamins-Minerals (SUPER MEGA VITE 75/BETA CARO PO), Take 1 tablet by mouth 2 (two) times daily., Disp: , Rfl:  .  naproxen sodium (ALEVE) 220 MG tablet, Take 220 mg by mouth as needed., Disp: , Rfl:  .  Probiotic Product (ALIGN PO), Take 1 capsule by mouth daily., Disp: , Rfl:   ROS:    General ROS: negative for - chills, fatigue, fever, night sweats, weight gain or weight loss Psychological ROS: negative for - behavioral disorder, hallucinations, memory difficulties, mood swings or suicidal ideation Ophthalmic ROS: negative for - blurry vision, double vision, eye pain or loss of vision Endocrine ROS: negative for - galactorrhea, hair pattern changes, polydipsia/polyuria or temperature intolerance Respiratory ROS: negative for - cough, hemoptysis, shortness of breath or wheezing Cardiovascular ROS: negative for - chest pain, dyspnea on exertion, edema or irregular heartbeat Gastrointestinal ROS: negative for -  abdominal pain, diarrhea, hematemesis, nausea/vomiting or stool incontinence Genito-Urinary ROS: negative for - dysuria, hematuria, incontinence or urinary frequency/urgency Musculoskeletal ROS: Positive for - muscular weakness Neurological ROS: as noted in HPI Dermatological ROS: negative for rash and skin lesion changes  Exam: Current vital signs: There were no vitals taken for this visit. Vital signs in last 24 hours: Temp:  [99.7 F (37.6 C)] 99.7 F (37.6 C) (02/09 1334) Pulse Rate:  [90-112] 90 (02/09 1900) Resp:  [20-35] 20 (02/09 1900) BP: (120-147)/(84-100) 120/91 (02/09 1900) SpO2:  [91 %-97 %] 97 % (02/09 1900) Weight:  [81.6 kg] 81.6 kg (02/09 1335)   Constitutional: Appears well-developed and well-nourished.  Psych: Affect appropriate to situation Eyes: No scleral injection HENT: No OP obstrucion Head: Normocephalic.  Cardiovascular: Tachycardic Respiratory: Tachypneic GI: Soft.  No distension. There is no tenderness.  Skin: WDI  Neuro: Mental Status: Patient is awake, alert, oriented to person, Patient is able to follow commands Patient is able to give history however family did need to be called to get further history Cranial Nerves: II: Left hemianopsia III,IV, VI: Right forced gaze.  Pupils equal, round and reactive to light V: Decreased sensation on the left face VII: Facial movement is symmetric.  VIII: hearing is intact to voice X: Palat elevates symmetrically XI: Shoulder shrug is symmetric. XII: tongue is midline without atrophy or fasciculations.  Motor: Tone is normal. Bulk is normal. 5/5 strength was present on right upper extremity lower extremity.  Left upper extremity 0/5, left lower extremity 5/5 with drift Positive drift drift on left arm and left leg aterixis Sensory: Sensation is intact on the right but left-sided neglect to DSS Deep Tendon Reflexes: 2+ and symmetric in the biceps and patellae.  Plantars: Toes are downgoing  bilaterally.  Cerebellar: FNF and HKS are intact bilaterally  Labs I have reviewed labs in epic and the results pertinent to this consultation are:  CBC    Component Value Date/Time   WBC 5.1 04/17/2019 1002   RBC 3.45 (L) 04/17/2019 1002   HGB 12.6 (L) 04/17/2019 1012   HCT 37.0 (L) 04/17/2019 1012   PLT 130 (L) 04/17/2019 1002   MCV 109.6 (H) 04/17/2019 1002   MCH 36.8 (H) 04/17/2019 1002   MCHC 33.6 04/17/2019 1002   RDW 13.7 04/17/2019 1002   LYMPHSABS 0.9 04/17/2019 1002   MONOABS 0.5 04/17/2019 1002   EOSABS 0.1 04/17/2019 1002   BASOSABS 0.0 04/17/2019 1002    CMP     Component Value Date/Time   NA  136 04/17/2019 1012   K 3.8 04/17/2019 1012   CL 102 04/17/2019 1012   CO2 21 (L) 04/16/2019 1358   GLUCOSE 150 (H) 04/17/2019 1012   BUN 18 04/17/2019 1012   CREATININE 0.70 04/17/2019 1012   CALCIUM 8.7 (L) 04/16/2019 1358   PROT 6.9 04/16/2019 1358   ALBUMIN 2.9 (L) 04/16/2019 1358   AST 44 (H) 04/16/2019 1358   ALT 40 04/16/2019 1358   ALKPHOS 83 04/16/2019 1358   BILITOT 1.1 04/16/2019 1358   GFRNONAA >60 04/16/2019 1358   GFRAA >60 04/16/2019 1358    Lipid Panel     Component Value Date/Time   CHOL 222 (H) 02/24/2017 0830   TRIG 309.0 (H) 02/24/2017 0830   HDL 39.90 02/24/2017 0830   CHOLHDL 6 02/24/2017 0830   VLDL 61.8 (H) 02/24/2017 0830   LDLDIRECT 135.0 02/24/2017 0830     Imaging I have reviewed the images obtained:  CT-scan of the brain: IMPRESSION: 1. 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 ASPECTS 4. Subtle petechial hemorrhage is questioned within an anterior right frontal lobe infarct site. No significant mass effect. 2. Hyperdensity of the M1 right MCA likely reflecting thrombus. 3. Mild generalized parenchymal atrophy and chronic small vessel ischemic disease.  CTA head: The M1 right MCA is occluded shortly beyond its origin. There is minimal reconstruction of flow within the  M2 and more distal right MCA branch vessel. No other intracranial large vessel occlusion or proximal high-grade arterial stenosis.  CTA neck: The common and internal carotid arteries are patent within the neck bilaterally. Atherosclerosis within the bilateral carotid system. There is likely greater than 70% stenosis at the origin of the right ICA. 30-40% stenosis of the proximal left ICA. Vertebral arteries are patent within the neck bilaterally  CT perfusion: The perfusion identifies a 10 mm core infarct within the right MCA vascular territory. However no more extensive infarction changes were appreciated on the concurrently performed noncontrast head CT. Perfusion suffered attacks a 147 mL region of hypoperfused parenchyma within the right MCA vascular territory. Reported mismatch 137 mL.   Etta Quill PA-C [facility] [phone]  M-F  (9:00 am- 5:00 PM)  04/17/2019, 10:35 AM     Assessment:  This is a 79 year old male who was tested positive for COVID-19 yesterday.  Patient left AMA at that time.  Patient was at his baseline.  Per wife patient sat down in his recliner at approximately 9-9:30 at night.  At 2 AM he developed left-sided weakness and found to be weaker this morning.  Code stroke was called and patient was brought to [facility].  As noted above on exam patient had right gaze, neglect on the left, upper extremity flaccid lower extremity strong but with drift.  Patient was not a TPA candidate as he is out of the window.  Immediate CT, CTA head neck and perfusion was performed.  Patient has suffered a stroke in the right MCA vascular territory.   Impression -COVID-19 positive -Right MCA vascular territory infarct  Plan:  Acute Ischemic Stroke  Acuity: Acute Current Suspected Etiology: Thrombus -Admit to: ICU -Continue Statin -Start statin -Blood pressure control 120 -140 SBP  -MRI/ECHO/A1C/Lipid panel. 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

## 2019-04-17 NOTE — Sedation Documentation (Signed)
Condom catheter applied.

## 2019-04-17 NOTE — Consult Note (Addendum)
NAME:  Henry [name], MRN:  [id number], DOB:  [birth year], LOS: 0 ADMISSION DATE:  04/17/2019, CONSULTATION DATE: 2/10 REFERRING MD: Dr. Estanislado Pandy CHIEF COMPLAINT: Critical care management status post CVA  Brief History   79 year old male patient admitted to the intensive care status post acute proximal right MCA stroke on 2/10.  Was outside of window for TPA.  Went to interventional radiology for clot extraction, returned to ICU on ventilator critical care asked to assist with postop care.  Note: Seen in ER on 2/9 with positive diagnosis of COVID-19 after approximately 9 to 10-day prodrome of fever, cough, and decreased p.o. intake refused admission  History of present illness   79 year old white male admitted on 2/10 with new complaint of left-sided weakness, facial droop and right gaze preference.  Had about 10 d h/o intermittent fever, po intake worsening weakness and productive cough at times.  Presented to the emergency room initially on 2/9 initial test for Covid was negative but follow-up respiratory PCR was positive he was treated with empiric antibiotics, advised to admission but refused.. On initial evaluation and IHSS scale 16, CT angiogram with occluded right MCA.  Intubated for airway protection, brought to interventional radiology where he underwent Attempt at revascularization of the proximal MCA via clot traction, did not receive TPA, no stent placed.  Transferred to the intensive care post procedure, pulmonary asked to assist with care  Past Medical History  Gastroesophageal reflux disease, chronic low back pain, hypertension, polycythemia, paresthesia Significant Hospital Events   2/10:  admitted w/ acute Right MCA stroke-->to IR for attempted revascularization/clot extraction.  Return to the intensive care on ventilator, blood pressure goals 120-140, starting remdesivir and Decadron  Consults:   Critical care consulted 2/10 Procedures:  Intubation 2/10 Attempted  revascularization of right proximal MCA 2/10  Significant Diagnostic Tests:   CT head 2/10: 1. 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 ASPECTS 4. Subtle petechial hemorrhage is questioned within an anterior right frontal lobe infarct site. No significant mass effect. 2. Hyperdensity of the M1 right MCA likely reflecting thrombus. 3. Mild generalized parenchymal atrophy and chronic small vessel ischemic disease.1. 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 There is minimal reconstitution of flow within M2 and more distal right MCA branch vessels.2. No other intracranial large vessel occlusion or proximal high-grade arterial stenosis identified.  CT perfusion head 2/10:  The perfusion software identifies a 10 mL core infarct within the right MCA vascular territory. However, please note more extensive infarction changes were appreciated on concurrently performed noncontrast head CT. The perfusion software detects a 147 mL region of hypoperfused parenchyma within the right MCA vascular territory. Reported mismatch 137 mL.  MRI brain 2/10: Extensive restricted diffusion consistent with acute ischemia involving the right MCA vascular territory. There is T2/FLAIR hyperintensity which corresponds with some, but not all, of the acute ischemic changes as described. Micro Data:  Respiratory viral PCR 2/9: Positive for Covid Urine culture 2/9 Insignificant growth Respiratory culture 2/10>>> Antimicrobials:  Remdesivir 2/10  Interim history/subjective:  Sedated, currently on full ventilatory support  Objective   Blood pressure 119/70, pulse 65, temperature (Abnormal) 97.1 F (36.2 C), temperature source Axillary, resp. rate (Abnormal) 26, SpO2 96 %.        Intake/Output Summary (Last 24 hours) at 04/17/2019 1359 Last data filed at 04/17/2019 1328 Gross per 24 hour  Intake 750 ml  Output 1900 ml  Net -1150 ml   There were  no  vitals filed for this visit.  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  Now status post attempted revascularization on 2/10 in interventional radiology Plan Serial neuro checks RASS goal -1 Blood pressure goal 120-140 Telemetry monitoring Statin therapy Antiplatelet therapy per neurology Ensure normocapnia Avoid hypoxia Reimaging and further diagnostics per stroke team  Acute hypoxic respiratory failure in setting of COVID-19 pneumonia  Initially pulse oximetry 88% on room air per verbal report. Portable chest x-ray on 2/9 showed bilateral pulmonary infiltrates Plan Full ventilator support, tidal volume 6 to 8 mL/kg predicted body weight Plateau pressure goal less than 30, driving pressure goal less than 15 Pulse oximetry greater than 90, PaO2 goal 55 to 65 Obtain arterial blood gas, prone protocol on hold for now, but would consider if P/F ratio less than 150 Initiate remdesivir, plan to complete 5 days Initiate Decadron 6 mg IV x10 days Daily inflammatory markers Sputum culture Check pro calcitonin, low threshold for empiric antibiotics Daily assessment for weaning VAP bundle PAD protocol RASS goal -1   Hyperglycemia Plan Initiate hypoglycemia protocol Goal glucose 140-180  Mild anemia and thrombocytopenia Plan Trend CBC  Best practice:  Diet: N.p.o. Pain/Anxiety/Delirium protocol (if indicated): Started 2/10, propofol with as needed fentanyl VAP protocol (if indicated): 2/10 DVT prophylaxis: Low  molecular weight heparin GI prophylaxis: H2B Glucose control: Sliding scale insulin initiated Mobility: Bedrest Code Status: Full code Family Communication: Wife updated via phone Disposition: Critical care ICU  Labs   CBC: Recent Labs  Lab 04/16/19 1358 04/17/19 1002 04/17/19 1012  WBC 5.3 5.1  --   NEUTROABS 3.7 3.5  --   HGB 13.8 12.7* 12.6*  HCT 40.5 37.8* 37.0*  MCV 108.6* 109.6*  --   PLT 145* 130*  --     Basic Metabolic Panel: Recent Labs  Lab 04/16/19 1358 04/17/19 1002 04/17/19 1012  NA 134* 136 136  K 4.5 4.0 3.8  CL 102 103 102  CO2 21* 21*  --   GLUCOSE 120* 158* 150*  BUN 17 18 18   CREATININE 0.94 0.95 0.70  CALCIUM 8.7* 8.5*  --    GFR: Estimated Creatinine Clearance: 74.8 mL/min (by C-G formula based on SCr of 0.7 mg/dL). Recent Labs  Lab 04/16/19 1358 04/16/19 1440 04/16/19 1659 04/17/19 1002  WBC 5.3  --   --  5.1  LATICACIDVEN  --  1.6 1.5  --     Liver Function Tests: Recent Labs  Lab 04/16/19 1358 04/17/19 1002  AST 44* 36  ALT 40 33  ALKPHOS 83 71  BILITOT 1.1 1.0  PROT 6.9 6.0*  ALBUMIN 2.9* 2.5*   No results for input(s): LIPASE, AMYLASE in the last 168 hours. No results for input(s): AMMONIA in the last 168 hours.  ABG    Component Value Date/Time   TCO2 23 04/17/2019 1012     Coagulation Profile: No results for input(s): INR, PROTIME in the last 168 hours.  Cardiac Enzymes: No results for input(s): CKTOTAL, CKMB, CKMBINDEX, TROPONINI in the last 168 hours.  HbA1C: No results found for: HGBA1C  CBG: Recent Labs  Lab 04/17/19 1005  GLUCAP  141*    Review of Systems:   Not able  Past Medical History  He,  has a past medical history of Colon polyps, COVID-19, Elevated MCV (2011), GERD (gastroesophageal reflux disease) (2012), LBP (low back pain), Osteoarthritis, and Polycythemia (2011).   Surgical History    Past Surgical History:  Procedure Laterality Date  . APPENDECTOMY    . COLONOSCOPY    .  HERNIA REPAIR     march 2019  . INGUINAL HERNIA REPAIR Left 2015  . LUMBAR LAMINECTOMY/DECOMPRESSION MICRODISCECTOMY N/A 05/20/2016   Procedure: Lumbar Three-Four,Lunmbar Four-Five,Lumbar Five-Sacral One Laminectomy/Foraminotomy;  Surgeon: Kristeen Miss, MD;  Location: [facility];  Service: Neurosurgery;  Laterality: N/A;  . POLYPECTOMY       Social History   reports that he quit smoking about 42 years ago. His smoking use included cigarettes. He smoked 1.00 pack per day. He has never used smokeless tobacco. He reports that he does not drink alcohol or use drugs.   Family History   His family history includes Breast cancer in an other family member; Colon cancer (age of onset: 43) in his sister. There is no history of Stroke, Esophageal cancer, Pancreatic cancer, Prostate cancer, Rectal cancer, or Stomach cancer.   Allergies Allergies  Allergen Reactions  . No Known Allergies      Home Medications  Prior to Admission medications   Medication Sig Start Date End Date Taking? Authorizing Provider  cholecalciferol (VITAMIN D) 1000 units tablet Take 1,000 Units by mouth daily.   Yes [provider]  Probiotic Product (ALIGN PO) Take 1 capsule by mouth daily.   Yes [provider]  Misc Natural Products (PROSTATE HEALTH) CAPS Take 1 capsule by mouth daily.    [provider]  Multiple Vitamins-Minerals (SUPER MEGA VITE 75/BETA CARO PO) Take 1 tablet by mouth 2 (two) times daily.    [provider]  naproxen sodium (ALEVE) 220 MG tablet Take 220 mg by mouth as needed.    [provider]     Critical care time:  33 minutes    Erick Colace ACNP-BC [place] Pager # [phone] OR # [phone] if no answer

## 2019-04-17 NOTE — Progress Notes (Signed)
  Echocardiogram 2D Echocardiogram has been performed.  Din Bookwalter A Tyreisha Ungar 04/17/2019, 5:13 PM

## 2019-04-17 NOTE — Anesthesia Procedure Notes (Signed)
Arterial Line Insertion Start/End2/12/2019 12:00 PM, 04/17/2019 12:24 PM Performed by: Verdie Drown, CRNA  Preanesthetic checklist: patient identified, IV checked, risks and benefits discussed, surgical consent, monitors and equipment checked, pre-op evaluation and timeout performed Left, radial was placed Hand hygiene performed , maximum sterile barriers used  and Seldinger technique used Allen's test indicative of satisfactory collateral circulation Attempts: 2 Procedure performed without using ultrasound guided technique. Following insertion, Biopatch and dressing applied. Post procedure assessment: normal  Patient tolerated the procedure well with no immediate complications.

## 2019-04-17 NOTE — Transfer of Care (Signed)
Immediate Anesthesia Transfer of Care Note  Patient: [name]  Procedure(s) Performed: IR WITH ANESTHESIA (N/A )  Patient Location: ICU  Anesthesia Type:General  Level of Consciousness: Patient remains intubated per anesthesia plan  Airway & Oxygen Therapy: Patient remains intubated per anesthesia plan and Patient placed on Ventilator (see vital sign flow sheet for setting)  Post-op Assessment: Post -op Vital signs reviewed and stable  Post vital signs: stable  Last Vitals:  Vitals Value Taken Time  BP 129/72 04/17/19 1407  Temp    Pulse 64 04/17/19 1413  Resp 16 04/17/19 1413  SpO2 100 % 04/17/19 1413  Vitals shown include unvalidated device data.  Last Pain:  Vitals:   04/17/19 1036  TempSrc: Axillary  PainSc: 0-No pain         Complications: No apparent anesthesia complications

## 2019-04-17 NOTE — Progress Notes (Signed)
NP Marni Griffon instructed [place] to go off the cuff pressure instead of the aline pressure as the a line is no longer accurate

## 2019-04-17 NOTE — Sedation Documentation (Signed)
Pt with bladder distended. Lower abd massaged with no urine present.

## 2019-04-17 NOTE — Progress Notes (Signed)
Received pt already intubated in IR from CRNA.  PT transported to ICU via vent w/ no apparent complications. ICU RT aware.

## 2019-04-17 NOTE — Progress Notes (Signed)
Pt transported form 3M08 to CT and back with no complications.

## 2019-04-17 NOTE — Plan of Care (Signed)
Signed out by Dr Aroor to f/u on [facility]. Reviewed. No frank HT. Evolving infarct and contrast staining. Possible small peticheal blood can not be ruled out. Formal read below. No new orders at this time.  CT Head w/o contrast IMPRESSION: Ischemic changes in the right MCA territory affecting the cortical brain, subcortical brain and right basal ganglia. Areas of low-density as well as areas of indistinct higher density favored to represent contrast staining rather than frank hemorrhage. Low level petechial bleeding is not excluded. There is no discrete hematoma. No mass effect or shift, despite mild-to-moderate swelling in the region of affected brain.   -- Amie Portland, MD [facility] Pager: [phone] If 7pm to 7am, please call on call as listed on AMION.

## 2019-04-17 NOTE — Anesthesia Procedure Notes (Signed)
Procedure Name: Intubation Date/Time: 04/17/2019 11:38 AM Performed by: Lavell Luster, CRNA Pre-anesthesia Checklist: Patient identified, Emergency Drugs available, Suction available, Patient being monitored and Timeout performed Patient Re-evaluated:Patient Re-evaluated prior to induction Oxygen Delivery Method: Circle system utilized Preoxygenation: Pre-oxygenation with 100% oxygen Induction Type: IV induction, Rapid sequence and Cricoid Pressure applied Ventilation: Mask ventilation without difficulty Laryngoscope Size: Mac and 4 Grade View: Grade I Tube type: Oral Tube size: 7.5 mm Number of attempts: 1 Airway Equipment and Method: Stylet and Video-laryngoscopy Placement Confirmation: ETT inserted through vocal cords under direct vision and positive ETCO2 Secured at: 22 cm Tube secured with: Tape Dental Injury: Teeth and Oropharynx as per pre-operative assessment

## 2019-04-18 ENCOUNTER — Inpatient Hospital Stay (HOSPITAL_COMMUNITY): Payer: Medicare HMO

## 2019-04-18 DIAGNOSIS — I6601 Occlusion and stenosis of right middle cerebral artery: Secondary | ICD-10-CM

## 2019-04-18 DIAGNOSIS — R7989 Other specified abnormal findings of blood chemistry: Secondary | ICD-10-CM

## 2019-04-18 DIAGNOSIS — I63413 Cerebral infarction due to embolism of bilateral middle cerebral arteries: Secondary | ICD-10-CM

## 2019-04-18 DIAGNOSIS — I63511 Cerebral infarction due to unspecified occlusion or stenosis of right middle cerebral artery: Secondary | ICD-10-CM

## 2019-04-18 DIAGNOSIS — J9601 Acute respiratory failure with hypoxia: Secondary | ICD-10-CM

## 2019-04-18 DIAGNOSIS — J1282 Pneumonia due to coronavirus disease 2019: Secondary | ICD-10-CM

## 2019-04-18 DIAGNOSIS — U071 COVID-19: Secondary | ICD-10-CM

## 2019-04-18 LAB — D-DIMER, QUANTITATIVE: D-Dimer, Quant: >20 ug/mL-FEU — ABNORMAL HIGH (ref 0.00–0.50)

## 2019-04-18 LAB — LIPID PANEL
Cholesterol: 93 mg/dL (ref 0–200)
HDL: 19 mg/dL — ABNORMAL LOW (ref >40)
LDL Cholesterol: 53 mg/dL (ref 0–99)
Total CHOL/HDL Ratio: 4.9 RATIO
Triglycerides: 106 mg/dL (ref <150)
VLDL: 21 mg/dL (ref 0–40)

## 2019-04-18 LAB — CBC WITH DIFFERENTIAL/PLATELET
Abs Immature Granulocytes: 0.02 10*3/uL (ref 0.00–0.07)
Basophils Absolute: 0 10*3/uL (ref 0.0–0.1)
Basophils Relative: 0 %
Eosinophils Absolute: 0 10*3/uL (ref 0.0–0.5)
Eosinophils Relative: 0 %
HCT: 33.9 % — ABNORMAL LOW (ref 39.0–52.0)
Hemoglobin: 11.5 g/dL — ABNORMAL LOW (ref 13.0–17.0)
Immature Granulocytes: 0 %
Lymphocytes Relative: 15 %
Lymphs Abs: 1 10*3/uL (ref 0.7–4.0)
MCH: 36.6 pg — ABNORMAL HIGH (ref 26.0–34.0)
MCHC: 33.9 g/dL (ref 30.0–36.0)
MCV: 108 fL — ABNORMAL HIGH (ref 80.0–100.0)
Monocytes Absolute: 0.7 10*3/uL (ref 0.1–1.0)
Monocytes Relative: 11 %
Neutro Abs: 4.7 10*3/uL (ref 1.7–7.7)
Neutrophils Relative %: 74 %
Platelets: 134 10*3/uL — ABNORMAL LOW (ref 150–400)
RBC: 3.14 MIL/uL — ABNORMAL LOW (ref 4.22–5.81)
RDW: 14 % (ref 11.5–15.5)
WBC: 6.4 10*3/uL (ref 4.0–10.5)
nRBC: 0 % (ref 0.0–0.2)

## 2019-04-18 LAB — ECHOCARDIOGRAM LIMITED

## 2019-04-18 LAB — COMPREHENSIVE METABOLIC PANEL
ALT: 27 U/L (ref 0–44)
AST: 32 U/L (ref 15–41)
Albumin: 2.1 g/dL — ABNORMAL LOW (ref 3.5–5.0)
Alkaline Phosphatase: 69 U/L (ref 38–126)
Anion gap: 10 (ref 5–15)
BUN: 15 mg/dL (ref 8–23)
CO2: 22 mmol/L (ref 22–32)
Calcium: 8.1 mg/dL — ABNORMAL LOW (ref 8.9–10.3)
Chloride: 108 mmol/L (ref 98–111)
Creatinine, Ser: 0.79 mg/dL (ref 0.61–1.24)
GFR calc Af Amer: >60 mL/min (ref >60)
GFR calc non Af Amer: >60 mL/min (ref >60)
Glucose, Bld: 110 mg/dL — ABNORMAL HIGH (ref 70–99)
Potassium: 4.1 mmol/L (ref 3.5–5.1)
Sodium: 140 mmol/L (ref 135–145)
Total Bilirubin: 0.9 mg/dL (ref 0.3–1.2)
Total Protein: 5.4 g/dL — ABNORMAL LOW (ref 6.5–8.1)

## 2019-04-18 LAB — FERRITIN: Ferritin: 510 ng/mL — ABNORMAL HIGH (ref 24–336)

## 2019-04-18 LAB — GLUCOSE, CAPILLARY
Glucose-Capillary: 98 mg/dL (ref 70–99)
Glucose-Capillary: 89 mg/dL (ref 70–99)
Glucose-Capillary: 81 mg/dL (ref 70–99)
Glucose-Capillary: 112 mg/dL — ABNORMAL HIGH (ref 70–99)
Glucose-Capillary: 126 mg/dL — ABNORMAL HIGH (ref 70–99)

## 2019-04-18 LAB — C-REACTIVE PROTEIN: CRP: 10 mg/dL — ABNORMAL HIGH (ref <1.0)

## 2019-04-18 MED ORDER — HEPARIN (PORCINE) 25000 UT/250ML-% IV SOLN
1450.0000 [IU]/h | INTRAVENOUS | Status: DC
  Administered 2019-04-18: 1200 [IU]/h via INTRAVENOUS
  Administered 2019-04-20: 1450 [IU]/h via INTRAVENOUS
  Administered 2019-04-20: 1400 [IU]/h via INTRAVENOUS
  Administered 2019-04-21: 1450 [IU]/h via INTRAVENOUS
  Filled 2019-04-18 (×6): qty 250

## 2019-04-18 MED ORDER — HYDRALAZINE HCL 20 MG/ML IJ SOLN: 5.0000 mg | INTRAMUSCULAR | Status: DC | PRN

## 2019-04-18 MED ORDER — RESOURCE THICKENUP CLEAR PO POWD
ORAL | Status: DC | PRN
  Filled 2019-04-18: qty 125

## 2019-04-18 MED ORDER — ASPIRIN 300 MG RE SUPP
300.0000 mg | Freq: Once | RECTAL | Status: AC
  Administered 2019-04-18: 13:00:00 300 mg via RECTAL
  Filled 2019-04-18: qty 1

## 2019-04-18 MED ORDER — HYDRALAZINE HCL 20 MG/ML IJ SOLN: 10.0000 mg | INTRAMUSCULAR | Status: DC | PRN

## 2019-04-18 NOTE — Procedures (Signed)
Extubation Procedure Note  Patient Details:   Name: Henry [name] DOB: 04/30/1940 MRN: [id number]   Airway Documentation:    Vent end date: 04/18/19 Vent end time: 0920   Evaluation  O2 sats: stable throughout Complications: No apparent complications Patient did tolerate procedure well. Bilateral Breath Sounds: Clear, Diminished   Yes   Patient extubated per order to 2L [place] with no apparent complications. Positive cuff leak was noted prior to extubation. Patient is alert and oriented and is able to speak. Vitals are stable. RT will continue to monitor.    [name] 04/18/2019, 9:24 AM

## 2019-04-18 NOTE — Progress Notes (Signed)
Bilateral lower extremity venous duplex has been completed. Preliminary results can be found in CV Proc through chart review.  Results were given to the patient's nurse, Ava.  04/18/19 3:24 PM Henry [name] RVT

## 2019-04-18 NOTE — Anesthesia Postprocedure Evaluation (Signed)
Anesthesia Post Note  Patient: Henry [name]  Procedure(s) Performed: IR WITH ANESTHESIA (N/A )     Patient location during evaluation: SICU Anesthesia Type: General Level of consciousness: sedated Pain management: pain level controlled Vital Signs Assessment: post-procedure vital signs reviewed and stable Respiratory status: patient remains intubated per anesthesia plan Cardiovascular status: stable Postop Assessment: no apparent nausea or vomiting Anesthetic complications: no    Last Vitals:  Vitals:   04/18/19 1200 04/18/19 1215  BP: 126/66 114/61  Pulse: 71 78  Resp: (!) 31 (!) 38  Temp:    SpO2: 97% 95%    Last Pain:  Vitals:   04/18/19 1200  TempSrc:   PainSc: 0-No pain                 Gianlucas Evenson

## 2019-04-18 NOTE — Progress Notes (Signed)
STROKE TEAM PROGRESS NOTE   INTERVAL HISTORY RN at bedside. Pt sitting in chair, extubated this am, tolerating well, awake alert and orientated. Still has left sided weakness, right gaze preference and left neglect. He is on active COVID treatment. D-dimer > 20 today.   Vitals:   04/18/19 0930 04/18/19 1015 04/18/19 1030 04/18/19 1045  BP: 125/63 118/66 130/67 139/69  Pulse: (!) 56 (!) 59 61 (!) 57  Resp: (!) 27 (!) 22 16 19   Temp:      TempSrc:      SpO2: 96% 98% 100% 98%  Weight:        CBC:  Recent Labs  Lab 04/17/19 1002 04/17/19 1012 04/17/19 1557 04/18/19 0822  WBC 5.1  --   --  6.4  NEUTROABS 3.5  --   --  4.7  HGB 12.7*   < > 10.9* 11.5*  HCT 37.8*   < > 32.0* 33.9*  MCV 109.6*  --   --  108.0*  PLT 130*  --   --  134*   < > = values in this interval not displayed.    Basic Metabolic Panel:  Recent Labs  Lab 04/17/19 1002 04/17/19 1002 04/17/19 1012 04/17/19 1012 04/17/19 1557 04/18/19 0822  NA 136   < > 136   < > 137 140  K 4.0   < > 3.8   < > 4.1 4.1  CL 103   < > 102  --   --  108  CO2 21*  --   --   --   --  22  GLUCOSE 158*   < > 150*  --   --  110*  BUN 18   < > 18  --   --  15  CREATININE 0.95   < > 0.70  --   --  0.79  CALCIUM 8.5*  --   --   --   --  8.1*   < > = values in this interval not displayed.   Lipid Panel:     Component Value Date/Time   CHOL 93 04/18/2019 0822   TRIG 106 04/18/2019 0822   HDL 19 (L) 04/18/2019 0822   CHOLHDL 4.9 04/18/2019 0822   VLDL 21 04/18/2019 0822   LDLCALC 53 04/18/2019 0822   HgbA1c:  Lab Results  Component Value Date   HGBA1C 6.8 (H) 04/17/2019   Urine Drug Screen: No results found for: LABOPIA, COCAINSCRNUR, LABBENZ, AMPHETMU, THCU, LABBARB  Alcohol Level No results found for: ETH  IMAGING past 48 hours CT ANGIO HEAD W OR WO CONTRAST  Result Date: 04/17/2019 CLINICAL DATA:  Stroke, follow-up. EXAM: CT ANGIOGRAPHY HEAD AND NECK CT PERFUSION BRAIN TECHNIQUE: Multidetector CT imaging of the  head and neck was performed using the standard protocol during bolus administration of intravenous contrast. Multiplanar CT image reconstructions and MIPs were obtained to evaluate the vascular anatomy. Carotid stenosis measurements (when applicable) are obtained utilizing NASCET criteria, using the distal internal carotid diameter as the denominator. Multiphase CT imaging of the brain was performed following IV bolus contrast injection. Subsequent parametric perfusion maps were calculated using RAPID software. CONTRAST:  173mL OMNIPAQUE IOHEXOL 350 MG/ML SOLN COMPARISON:  Noncontrast head CT performed earlier the same day. FINDINGS: CTA NECK FINDINGS Aortic arch: The origins of the innominate and left common carotid arteries are incompletely imaged. The visualized aortic arch is unremarkable. No significant innominate or proximal subclavian stenosis visualized. Right carotid system: CCA patent to the bifurcation without stenosis. Prominent  predominantly calcified plaque within the distal common carotid artery and proximal ICA. There is likely greater than 70% stenosis at the origin of the ICA, although exact quantification of stenosis is difficult due to irregularity of calcified plaque. Distal to this the ICA is patent within the neck without stenosis. Left carotid system: CCA patent to the bifurcation without stenosis. Calcified plaque within the distal common carotid artery, carotid bifurcation and proximal ICA. Resultant 30-40% narrowing of the proximal ICA as compared to the more distal vessel. Distal to this the ICA is patent within the neck without stenosis. Vertebral arteries: Right vertebral artery dominant. 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 Calcified plaque at the origin of the medial V1 left vertebral artery branch with at least moderate ostial stenosis. More distally the bilateral vertebral arteries are patent within the neck without stenosis. Skeleton: No acute bony abnormality. Cervical  spondylosis with C5-C6 and C6-C7 posterior disc osteophytes. Other neck: No neck mass or cervical adenopathy. Upper chest: Paraseptal emphysema within the imaged lung apices. Also within the imaged lung apices but there is interstitial prominence suspicious for interstitial lung disease. There are more nodular opacities within the right upper lobe measuring up to 1.4 cm (series 7, image 56) (series 7, image 99). Review of the MIP images confirms the above findings CTA HEAD FINDINGS Anterior circulation: The intracranial internal carotid arteries are patent with scattered calcified plaque. No more than mild stenosis within these vessels. 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 There is minimal reconstitution of flow seen within M2 and more distal right MCA branch vessels. The left middle cerebral artery is patent without high-grade proximal stenosis. The bilateral anterior cerebral arteries are patent without significant proximal stenosis. Posterior circulation: The intracranial vertebral arteries are patent without significant stenosis, as is the basilar artery. The bilateral posterior cerebral arteries are patent without significant proximal stenosis. A sizable right posterior communicating artery is present. The a left posterior communicating artery is poorly delineated and may be hypoplastic or absent. Venous sinuses: Within limitations of contrast timing, no convincing thrombus. Anatomic variants: As described Review of the MIP images confirms the above findings CT Brain Perfusion Findings: ASPECTS: 4 CBF (<30%) Volume: 66mL (in the right MCA vascular territory) Perfusion (Tmax>6.0s) volume: 147 mL (in the right MCA vascular territory) Mismatch Volume: 130mL Infarction Location:Right MCA vascular territory Findings of a proximal M1 right MCA occlusion and the perfusion results were discussed by telephone at the time of interpretation on 04/17/2019 at 10:38 am to provider Dr. Lorraine Lax, who verbally  acknowledged these results. IMPRESSION: CTA neck: 1. The common and internal carotid arteries are patent within the neck bilaterally. 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 There is likely greater than 70% stenosis at the origin of the right ICA. 30-40% stenosis of the proximal left ICA. 2. The vertebral arteries are patent within the neck bilaterally. The right vertebral artery is dominant with moderate atherosclerotic narrowing at its origin. Duplicated V1 left vertebral artery. There is at least moderate ostial stenosis at the origin of the medial left V1 vertebral artery branch. 3. The partially imaged lung apices demonstrate findings suspicious for interstitial lung disease. Additionally, there are nodular opacities within the right lung apex measuring up to 1.4 cm. Dedicated chest CT is recommended for further evaluation when clinically feasible. CTA head: 1. 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 There is minimal reconstitution of flow within M2 and more distal right MCA branch vessels. 2. No  other intracranial large vessel occlusion or proximal high-grade arterial stenosis identified. CT perfusion head: The perfusion software identifies a 10 mL core infarct within the right MCA vascular territory. However, please note more extensive infarction changes were appreciated on concurrently performed noncontrast head CT. The perfusion software detects a 147 mL region of hypoperfused parenchyma within the right MCA vascular territory. Reported mismatch 137 mL. Electronically Signed   By: Kellie Simmering DO   On: 04/17/2019 11:04   CT HEAD WO CONTRAST  Result Date: 04/17/2019 CLINICAL DATA:  Follow-up intervention for right MCA occlusion. EXAM: CT HEAD WITHOUT CONTRAST TECHNIQUE: Contiguous axial images were obtained from the base of the skull through the vertex without intravenous contrast. COMPARISON:  Interventional images same day.  Head CT same day. FINDINGS: Brain:  In the region the small core infarction in the lateral temporal lobe and the penumbra of the majority of the right middle cerebral artery cortical territory, there is brain swelling with effacement of the sulci and areas of patchy low-density as well as some areas of increased attenuation. Low-density present within the right basal ganglia. The appearance of the increased attenuation is more consistent with post procedural contrast staining than intraparenchymal hemorrhage. Hemorrhage is not excluded but not favored. Certainly, there is no frank hematoma. No evidence of midline shift. No hydrocephalus. No extra-axial collection. Vascular: No acute vascular finding primarily. Skull: Negative Sinuses/Orbits: Clear except for mild mucosal thickening. Other: None IMPRESSION: Ischemic changes in the right MCA territory affecting the cortical brain, subcortical brain and right basal ganglia. Areas of low-density as well as areas of indistinct higher density favored to represent contrast staining rather than frank hemorrhage. Low level petechial bleeding is not excluded. There is no discrete hematoma. No mass effect or shift, despite mild-to-moderate swelling in the region of affected brain. Electronically Signed   By: Nelson Chimes M.D.   On: 04/17/2019 20:35   CT ANGIO NECK W OR WO CONTRAST  Result Date: 04/17/2019 CLINICAL DATA:  Stroke, follow-up. EXAM: CT ANGIOGRAPHY HEAD AND NECK CT PERFUSION BRAIN TECHNIQUE: Multidetector CT imaging of the head and neck was performed using the standard protocol during bolus administration of intravenous contrast. Multiplanar CT image reconstructions and MIPs were obtained to evaluate the vascular anatomy. Carotid stenosis measurements (when applicable) are obtained utilizing NASCET criteria, using the distal internal carotid diameter as the denominator. Multiphase CT imaging of the brain was performed following IV bolus contrast injection. Subsequent parametric perfusion maps  were calculated using RAPID software. CONTRAST:  11mL OMNIPAQUE IOHEXOL 350 MG/ML SOLN COMPARISON:  Noncontrast head CT performed earlier the same day. FINDINGS: CTA NECK FINDINGS Aortic arch: The origins of the innominate and left common carotid arteries are incompletely imaged. The visualized aortic arch is unremarkable. No significant innominate or proximal subclavian stenosis visualized. Right carotid system: CCA patent to the bifurcation without stenosis. Prominent predominantly calcified plaque within the distal common carotid artery and proximal ICA. There is likely greater than 70% stenosis at the origin of the ICA, although exact quantification of stenosis is difficult due to irregularity of calcified plaque. Distal to this the ICA is patent within the neck without stenosis. Left carotid system: CCA patent to the bifurcation without stenosis. Calcified plaque within the distal common carotid artery, carotid bifurcation and proximal ICA. Resultant 30-40% narrowing of the proximal ICA as compared to the more distal vessel. Distal to this the ICA is patent within the neck without stenosis. Vertebral arteries: Right vertebral artery dominant. The V1 left vertebral artery  is duplicated. Calcified plaque at the origin of the medial V1 left vertebral artery branch with at least moderate ostial stenosis. More distally the bilateral vertebral arteries are patent within the neck without stenosis. Skeleton: No acute bony abnormality. Cervical spondylosis with C5-C6 and C6-C7 posterior disc osteophytes. Other neck: No neck mass or cervical adenopathy. Upper chest: Paraseptal emphysema within the imaged lung apices. Also within the imaged lung apices but there is interstitial prominence suspicious for interstitial lung disease. There are more nodular opacities within the right upper lobe measuring up to 1.4 cm (series 7, image 56) (series 7, image 99). Review of the MIP images confirms the above findings CTA HEAD  FINDINGS Anterior circulation: The intracranial internal carotid arteries are patent with scattered calcified plaque. No more than mild stenosis within these vessels. 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 There is minimal reconstitution of flow seen within M2 and more distal right MCA branch vessels. The left middle cerebral artery is patent without high-grade proximal stenosis. The bilateral anterior cerebral arteries are patent without significant proximal stenosis. Posterior circulation: The intracranial vertebral arteries are patent without significant stenosis, as is the basilar artery. The bilateral posterior cerebral arteries are patent without significant proximal stenosis. A sizable right posterior communicating artery is present. The a left posterior communicating artery is poorly delineated and may be hypoplastic or absent. Venous sinuses: Within limitations of contrast timing, no convincing thrombus. Anatomic variants: As described Review of the MIP images confirms the above findings CT Brain Perfusion Findings: ASPECTS: 4 CBF (<30%) Volume: 79mL (in the right MCA vascular territory) Perfusion (Tmax>6.0s) volume: 147 mL (in the right MCA vascular territory) Mismatch Volume: 170mL Infarction Location:Right MCA vascular territory Findings of a proximal M1 right MCA occlusion and the perfusion results were discussed by telephone at the time of interpretation on 04/17/2019 at 10:38 am to provider Dr. Lorraine Lax, who verbally acknowledged these results. IMPRESSION: CTA neck: 1. The common and internal carotid arteries are patent within the neck bilaterally. 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 There is likely greater than 70% stenosis at the origin of the right ICA. 30-40% stenosis of the proximal left ICA. 2. The vertebral arteries are patent within the neck bilaterally. The right vertebral artery is dominant with moderate atherosclerotic narrowing at its origin.  Duplicated V1 left vertebral artery. There is at least moderate ostial stenosis at the origin of the medial left V1 vertebral artery branch. 3. The partially imaged lung apices demonstrate findings suspicious for interstitial lung disease. Additionally, there are nodular opacities within the right lung apex measuring up to 1.4 cm. Dedicated chest CT is recommended for further evaluation when clinically feasible. CTA head: 1. 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 There is minimal reconstitution of flow within M2 and more distal right MCA branch vessels. 2. No other intracranial large vessel occlusion or proximal high-grade arterial stenosis identified. CT perfusion head: The perfusion software identifies a 10 mL core infarct within the right MCA vascular territory. However, please note more extensive infarction changes were appreciated on concurrently performed noncontrast head CT. The perfusion software detects a 147 mL region of hypoperfused parenchyma within the right MCA vascular territory. Reported mismatch 137 mL. Electronically Signed   By: Kellie Simmering DO   On: 04/17/2019 11:04   MR BRAIN WO CONTRAST  Result Date: 04/17/2019 CLINICAL DATA:  Focal neuro deficit, greater than 6 hours, stroke suspected. EXAM: MRI HEAD WITHOUT CONTRAST TECHNIQUE: Multiplanar, multiecho pulse  sequences of the brain and surrounding structures were obtained without intravenous contrast. COMPARISON:  Noncontrast CT head, CT angiogram head/neck and CT perfusion performed earlier the same day 04/17/2019 FINDINGS: Brain: A limited protocol brain MRI was performed at the ordering provider's request consisting of only axial diffusion-weighted imaging and axial T2 FLAIR imaging. There is extensive multifocal restricted diffusion within the right MCA vascular territory, consistent with acute ischemic changes and involving portions of the right frontal, parietal, temporal and occipital lobes as well as right insular  cortex, posterior limb of right internal capsule, right lentiform nucleus, right caudate body and right corona radiata. Of note, this includes acute ischemic change within the right motor strip. The largest region of diffusion weighted signal abnormality within the right temporal lobe measures 7.8 x 3.0 cm in transaxial dimensions (series 4, image 23). There is T2/FLAIR hyperintensity which corresponds with some, but not all, of the acute ischemic change. For instance, there is little to no T2/FLAIR hyperintensity corresponding with the ischemic changes within the right corona radiata, basal ganglia and right insula at this time. Punctate acute cortical infarct within the left parietooccipital lobe (series 4, image 33). Additional probable punctate acute infarcts more superiorly within the left parietal lobe (series 4, image 35) and within the lateral left occipital lobe (series 4, image 24). Vascular: Poorly assessed on the acquired sequences. Skull and upper cervical spine: No evidence of focal marrow lesion on the acquired sequences. Sinuses/Orbits: Visualized orbits demonstrate no acute abnormality. Minimal ethmoid sinus mucosal thickening. No significant mastoid effusion. IMPRESSION: Extensive restricted diffusion consistent with acute ischemia involving the right MCA vascular territory. There is T2/FLAIR hyperintensity which corresponds with some, but not all, of the acute ischemic changes as described. Additional punctate acute infarcts within the left parietal and occipital lobes. Electronically Signed   By: Kellie Simmering DO   On: 04/17/2019 12:50   CT CEREBRAL PERFUSION W CONTRAST  Result Date: 04/17/2019 CLINICAL DATA:  Stroke, follow-up. EXAM: CT ANGIOGRAPHY HEAD AND NECK CT PERFUSION BRAIN TECHNIQUE: Multidetector CT imaging of the head and neck was performed using the standard protocol during bolus administration of intravenous contrast. Multiplanar CT image reconstructions and MIPs were obtained to  evaluate the vascular anatomy. Carotid stenosis measurements (when applicable) are obtained utilizing NASCET criteria, using the distal internal carotid diameter as the denominator. Multiphase CT imaging of the brain was performed following IV bolus contrast injection. Subsequent parametric perfusion maps were calculated using RAPID software. CONTRAST:  123mL OMNIPAQUE IOHEXOL 350 MG/ML SOLN COMPARISON:  Noncontrast head CT performed earlier the same day. FINDINGS: CTA NECK FINDINGS Aortic arch: The origins of the innominate and left common carotid arteries are incompletely imaged. The visualized aortic arch is unremarkable. No significant innominate or proximal subclavian stenosis visualized. Right carotid system: CCA patent to the bifurcation without stenosis. Prominent predominantly calcified plaque within the distal common carotid artery and proximal ICA. There is likely greater than 70% stenosis at the origin of the ICA, although exact quantification of stenosis is difficult due to irregularity of calcified plaque. Distal to this the ICA is patent within the neck without stenosis. Left carotid system: CCA patent to the bifurcation without stenosis. Calcified plaque within the distal common carotid artery, carotid bifurcation and proximal ICA. Resultant 30-40% narrowing of the proximal ICA as compared to the more distal vessel. Distal to this the ICA is patent within the neck without stenosis. Vertebral arteries: Right vertebral artery dominant. 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 Calcified plaque at the  origin of the medial V1 left vertebral artery branch with at least moderate ostial stenosis. More distally the bilateral vertebral arteries are patent within the neck without stenosis. Skeleton: No acute bony abnormality. Cervical spondylosis with C5-C6 and C6-C7 posterior disc osteophytes. Other neck: No neck mass or cervical adenopathy. Upper chest: Paraseptal emphysema within the imaged lung apices.  Also within the imaged lung apices but there is interstitial prominence suspicious for interstitial lung disease. There are more nodular opacities within the right upper lobe measuring up to 1.4 cm (series 7, image 56) (series 7, image 99). Review of the MIP images confirms the above findings CTA HEAD FINDINGS Anterior circulation: The intracranial internal carotid arteries are patent with scattered calcified plaque. No more than mild stenosis within these vessels. 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 There is minimal reconstitution of flow seen within M2 and more distal right MCA branch vessels. The left middle cerebral artery is patent without high-grade proximal stenosis. The bilateral anterior cerebral arteries are patent without significant proximal stenosis. Posterior circulation: The intracranial vertebral arteries are patent without significant stenosis, as is the basilar artery. The bilateral posterior cerebral arteries are patent without significant proximal stenosis. A sizable right posterior communicating artery is present. The a left posterior communicating artery is poorly delineated and may be hypoplastic or absent. Venous sinuses: Within limitations of contrast timing, no convincing thrombus. Anatomic variants: As described Review of the MIP images confirms the above findings CT Brain Perfusion Findings: ASPECTS: 4 CBF (<30%) Volume: 70mL (in the right MCA vascular territory) Perfusion (Tmax>6.0s) volume: 147 mL (in the right MCA vascular territory) Mismatch Volume: 171mL Infarction Location:Right MCA vascular territory Findings of a proximal M1 right MCA occlusion and the perfusion results were discussed by telephone at the time of interpretation on 04/17/2019 at 10:38 am to provider Dr. Lorraine Lax, who verbally acknowledged these results. IMPRESSION: CTA neck: 1. The common and internal carotid arteries are patent within the neck bilaterally. Atherosclerosis within the bilateral  carotid systems as described. There is likely greater than 70% stenosis at the origin of the right ICA. 30-40% stenosis of the proximal left ICA. 2. The vertebral arteries are patent within the neck bilaterally. The right vertebral artery is dominant with moderate atherosclerotic narrowing at its origin. Duplicated V1 left vertebral artery. There is at least moderate ostial stenosis at the origin of the medial left V1 vertebral artery branch. 3. The partially imaged lung apices demonstrate findings suspicious for interstitial lung disease. Additionally, there are nodular opacities within the right lung apex measuring up to 1.4 cm. Dedicated chest CT is recommended for further evaluation when clinically feasible. CTA head: 1. 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 There is minimal reconstitution of flow within M2 and more distal right MCA branch vessels. 2. No other intracranial large vessel occlusion or proximal high-grade arterial stenosis identified. CT perfusion head: The perfusion software identifies a 10 mL core infarct within the right MCA vascular territory. However, please note more extensive infarction changes were appreciated on concurrently performed noncontrast head CT. The perfusion software detects a 147 mL region of hypoperfused parenchyma within the right MCA vascular territory. Reported mismatch 137 mL. Electronically Signed   By: Kellie Simmering DO   On: 04/17/2019 11:04   DG Chest Port 1 View  Result Date: 04/18/2019 CLINICAL DATA:  Acute respiratory failure EXAM: PORTABLE CHEST 1 VIEW COMPARISON:  Radiograph 04/17/2019 FINDINGS: *Endotracheal tube in the mid trachea, 5.5 cm from the  carina. *Telemetry leads overlie the chest. Slight improvement in the bilateral airspace disease most pronounced in the left base and periphery and right mid lung. No new areas of airspace disease. No pneumothorax or visible effusion the portion of the costophrenic sulci on the left is collimated.  Cardiomediastinal contours are stable with a calcified aorta. No acute osseous or soft tissue abnormality. IMPRESSION: Stable satisfactory positioning of the endotracheal tube. Slight interval improvement of bilateral airspace disease. Electronically Signed   By: Lovena Le M.D.   On: 04/18/2019 06:15   DG Chest Port 1 View  Result Date: 04/17/2019 CLINICAL DATA:  COVID-19. Intubation. EXAM: PORTABLE CHEST 1 VIEW COMPARISON:  Radiograph yesterday. FINDINGS: Endotracheal tube tip 5.8 cm from the carina. Probable esophageal temperature probe with tip at the level of the clavicular heads. No definite enteric tube is visualized. Patchy bilateral airspace disease within both lungs, slight worsening in the right midlung zone from prior. Unchanged heart size and mediastinal contours. No pneumothorax or large pleural effusion. No acute osseous abnormalities are seen. IMPRESSION: 1. Bilateral pneumonia, with slight worsening in the right mid lung since yesterday. 2. Endotracheal tube tip 5.8 cm from the carina. Probable esophageal temperature probe with tip at the clavicular heads. Electronically Signed   By: Keith Rake M.D.   On: 04/17/2019 16:45   DG Chest Portable 1 View  Result Date: 04/16/2019 CLINICAL DATA:  Cough and fever. New weakness. EXAM: PORTABLE CHEST 1 VIEW COMPARISON:  08/11/2009 FINDINGS: Normal sized heart. Interval diffuse prominence of the interstitial markings and patchy opacities in the mid and lower lung zones bilaterally. No visible pleural fluid. Diffuse osteo IMPRESSION: Interval probable bilateral pneumonia and pneumonitis. Electronically Signed   By: Claudie Revering M.D.   On: 04/16/2019 14:28   CT HEAD CODE STROKE WO CONTRAST  Result Date: 04/17/2019 CLINICAL DATA:  Code stroke. Left-sided facial droop/weakness, last known well noon yesterday EXAM: CT HEAD WITHOUT CONTRAST TECHNIQUE: Contiguous axial images were obtained from the base of the skull through the vertex without  intravenous contrast. COMPARISON:  No pertinent prior studies available for comparison. 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 Subtle petechial hemorrhage is questioned within a region of infarction within the anterior right frontal lobe (series 2, image 19). No significant mass effect or midline shift. No extra-axial fluid collection. Background mild ill-defined hypoattenuation within the cerebral white matter is nonspecific, but consistent with chronic small vessel ischemic disease. Mild generalized parenchymal atrophy. Vascular: Abnormal hyperdensity of the M1 right MCA likely reflecting thrombus. Atherosclerotic calcifications. Skull: No calvarial fracture or suspicious osseous lesion. Sinuses/Orbits: Visualized orbits demonstrate no acute abnormality. Mild paranasal sinus mucosal thickening. No significant mastoid effusion. ASPECTS ([place] Stroke Program Early CT Score) - Ganglionic level infarction (caudate, lentiform nuclei, internal capsule, insula, M1-M3 cortex): 4 (points subtracted for M2, M3 and insula) - Supraganglionic infarction (M4-M6 cortex): 0 Total score (0-10 with 10 being normal): 4 These results were communicated to Dr. Lorraine Lax At 10:38 amon 2/10/2021by text page via the [facility] messaging system. IMPRESSION: 1. 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 ASPECTS 4. Subtle petechial hemorrhage is questioned within an anterior right frontal lobe infarct site. No significant mass effect. 2. Hyperdensity of the M1 right MCA likely reflecting thrombus. 3. Mild generalized parenchymal atrophy and chronic small vessel ischemic  disease. Electronically Signed   By: Kellie Simmering DO   On: 04/17/2019 10:38   Cerebral Angio 04/17/2019 S/P  RT common carotid arteriogram  Followed by near complete revascularization of occcluded RT MCA M1 seg with x 1 pass with 95mmx 40 mm solitaire X  retriever and x 1 pass with 6mm x 32 mm Trevoprovue retriever and penumbra aspiration achieving a TICI 2C revascularization.[name]  PHYSICAL EXAM  Temp:  [98.5 F (36.9 C)-99.6 F (37.6 C)] 98.9 F (37.2 C) (02/11 1300) Pulse Rate:  [45-78] 71 (02/11 1416) Resp:  [15-38] 22 (02/11 1416) BP: (114-150)/(51-125) 115/53 (02/11 1300) SpO2:  [92 %-100 %] 97 % (02/11 1401) Arterial Line BP: (85-185)/(59-108) 110/102 (02/11 0600) FiO2 (%):  [28 %-40 %] 28 % (02/11 0923) Weight:  [80.1 kg] 80.1 kg (02/11 0355)  General - Well nourished, well developed, in no apparent distress.  Ophthalmologic - fundi not visualized due to noncooperation.  Cardiovascular - Regular rhythm and rate.  Mental Status -  Level of arousal and orientation to time, place, and person were intact. Language including expression, naming, repetition, comprehension was assessed and found intact. Mild dysarthria  Cranial Nerves II - XII - II - left visual neglect. III, IV, VI - right gaze preference and not able to cross midline. V - Facial sensation showed left sensation neglect. VII - left facial droop. VIII - Hearing & vestibular intact bilaterally. X - Palate elevates symmetrically. XI - Shoulder shrug decreased on the left. XII - Tongue protrusion intact.  Motor Strength - The patient's strength was normal RUE and RLE, however, left UE 2/5 proximal and 0/5 distal, left LE 2+/5 proximal and 4/5 distal.  Bulk was normal and fasciculations were absent.   Motor Tone - Muscle tone was assessed at the neck and appendages and was normal.  Reflexes - The patient's reflexes were symmetrical in all extremities and he had no pathological reflexes.  Sensory - Light touch, temperature/pinprick were assessed and diminished on the left with left sensory neglect.    Coordination - The  patient had normal movements in the right hand with no ataxia or dysmetria.  Tremor was absent.  Gait and Station - deferred.   ASSESSMENT/PLAN Henry [name] is a 79 y.o. male with history of mild hypertension, low back pain, polycythemia, flu sx x 1 week presenting to ED earlier in the day with  generalized weakness, cough and fever, dx with COVID and left AMA. Later developed left-sided weakness,  neglect and R gaze preference. EMS returned him to the ED.   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 Hypercoagulable from COVID-19 infection vs cardioembolic source  CT head R MCA infarction w/ subtle petechial hemorrhage R frontal lobe. hyperdense R M1. 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 Proximal L ICA 30-40% stenosis. R VA w/ moderate narrowing at origin. L V1 moderate origin stenosis. Lung apices suspicious for interstitial lung dz, nodular opacity R lung apex.  CT perfusion R MCA 57mL core infarct. 159mL penumbra w/ 115mL mismatch.   Limited MRI  Extensive R MCA infarct. Punctate L parietal and occipital lobe infarcts.  Cerebral angio R M1 occlusion w/ near complete revascularization TICI2c  Repeat CT head 2/10 2035 R MCA cortical, subcortical and BG infarcts with likely contrast staining instead of hemorrhage. No mass effect  or shift.   Repeat MRI pending   Repeat MRA pending   LE venous doppler pending   2D Echo pending  LDL 53  HgbA1c 6.8  Lovenox 40 mg sq daily for VTE prophylaxis  No antithrombotic prior to admission, now on aspirin 300 mg suppository.  Therapy recommendations:  CIR  Disposition:  pending   Acute Respiratory Failure COVID-19 PNA  intubated for IR, left intubated after IR d/t COVID  Remdesivir D2/5  Decadron D2/10  Extubate 2/10  D-Dimer increasing 13.53-> >20   CRP 13.5  CCM on  board  Hypertension  Home meds:  None listed . BP goal per IR x 24h following IR procedure - 120-140 . On phenylephrine gtt . Long-term BP goal normotensive  Dysphagia . Secondary to stroke . NPO . Speech on board . Pending swallow evaluation   Other Stroke Risk Factors  Advanced age  Former Cigarette smoker, quit 42 yrs ago  Other Active Problems  Acute blood loss anemia Hgb 12.6->10.9->11.5   Chronic thrombocytopenia PLT 130->134   Hospital day # 1  This patient is critically ill due to right MCA stroke, right M1 occlusion s/p thrombectomy, COVID infection, COVID pneumonia and at significant risk of neurological worsening, death form recurrent stroke, hemorrhagic conversion, COVID pneumonia, sepsis, hypercoagulability. This patient's care requires constant monitoring of vital signs, hemodynamics, respiratory and cardiac monitoring, review of multiple databases, neurological assessment, discussion with family, other specialists and medical decision making of high complexity. I spent 35 minutes of neurocritical care time in the care of this patient.  Rosalin Hawking, MD PhD Stroke Neurology 04/18/2019 4:03 PM    To contact Stroke Continuity provider, please refer to [email or web address] After hours, contact General Neurology

## 2019-04-18 NOTE — Evaluation (Signed)
Physical Therapy Evaluation Patient Details Name: Henry [name] MRN: [id number] DOB: October 19, 1940 Today's Date: 04/18/2019   History of Present Illness  79 year old male patient admitted to the intensive care status post acute proximal right MCA stroke on 2/10.  Was outside of window for TPA.  Went to interventional radiology for clot extraction, returned to ICU on ventilator critical care asked to assist with postop care.  Clinical Impression  Pt admitted with above diagnosis. Pt was able to laterally scoot to recliner to left with max assist with pt initiating movement and needing guidance for scoot to chair. Sits EOB with min to mod assist losing balance to left and posteriorly unless cued verbally and tactilly.  Pt is a great rehab candidate.  Will follow acutely.  Pt currently with functional limitations due to the deficits listed below (see PT Problem List). Pt will benefit from skilled PT to increase their independence and safety with mobility to allow discharge to the venue listed below.      Follow Up Recommendations CIR;Supervision/Assistance - 24 hour    Equipment Recommendations  Other (comment)(TBA)    Recommendations for Other Services Rehab consult     Precautions / Restrictions Precautions Precautions: Fall Restrictions Weight Bearing Restrictions: No      Mobility  Bed Mobility Overal bed mobility: Needs Assistance Bed Mobility: Rolling;Sidelying to Sit Rolling: Mod assist Sidelying to sit: Mod assist       General bed mobility comments: Needed mod assist to roll and for elevation of trunk.    Transfers Overall transfer level: Needs assistance Equipment used: None Transfers: Lateral/Scoot Transfers          Lateral/Scoot Transfers: Max assist;From elevated surface General transfer comment: Pt needed max assist to scoot to drop arm recliner to his left.  Pt was able to lift buttocks enough by anterior lean for PT to assist laterally scooting to the  recliner.  Pt using his right UE appropriately to assist.  Pt tends to lean to his left needing assist for upright posture.   Ambulation/Gait             General Gait Details: Unable at this time  Stairs            Wheelchair Mobility    Modified Rankin (Stroke Patients Only) Modified Rankin (Stroke Patients Only) Pre-Morbid Rankin Score: No symptoms Modified Rankin: Severe disability     Balance Overall balance assessment: Needs assistance Sitting-balance support: Single extremity supported;Feet supported Sitting balance-Leahy Scale: Poor Sitting balance - Comments: Requires mod assist to min assist to sit EOB as pt has difficulty finding midline position. Leans posteriorly and to his left.   Postural control: Posterior lean;Left lateral lean                                   Pertinent Vitals/Pain Pain Assessment: No/denies pain    Home Living Family/patient expects to be discharged to:: Private residence Living Arrangements: Spouse/significant other Available Help at Discharge: Family;Available 24 hours/day Type of Home: House Home Access: Stairs to enter   [employer] of Steps: 2 Home Layout: One level Home Equipment: None      Prior Function Level of Independence: Independent               Hand Dominance   Dominant Hand: Right    Extremity/Trunk Assessment   Upper Extremity Assessment Upper Extremity Assessment: Defer to OT evaluation  Lower Extremity Assessment Lower Extremity Assessment: LLE deficits/detail LLE Coordination: decreased fine motor;decreased gross motor    Cervical / Trunk Assessment Cervical / Trunk Assessment: Kyphotic  Communication   Communication: Expressive difficulties  Cognition Arousal/Alertness: Awake/alert Behavior During Therapy: Flat affect Overall Cognitive Status: Impaired/Different from baseline Area of Impairment: Orientation;Attention;Following  commands;Memory;Safety/judgement;Awareness;Problem solving                 Orientation Level: Disoriented to;Situation;Time Current Attention Level: Focused   Following Commands: Follows one step commands inconsistently;Follows one step commands with increased time Safety/Judgement: Decreased awareness of safety;Decreased awareness of deficits Awareness: Intellectual Problem Solving: Slow processing;Decreased initiation;Difficulty sequencing;Requires verbal cues;Requires tactile cues General Comments: Pt stated place but stated January. Aware of situation that he came in with Covid but unaware of CVA.       General Comments General comments (skin integrity, edema, etc.): 58 bpm, 98% 2L, 125/63.  Of note, pt with right gaze preference noted as well.     Exercises General Exercises - Lower Extremity Ankle Circles/Pumps: AROM;Both;5 reps;Supine Long Arc Quad: AROM;Both;5 reps;Seated   Assessment/Plan    PT Assessment Patient needs continued PT services  PT Problem List Decreased activity tolerance;Decreased balance;Decreased mobility;Decreased coordination;Decreased strength;Decreased knowledge of use of DME;Decreased safety awareness;Decreased knowledge of precautions;Cardiopulmonary status limiting activity       PT Treatment Interventions DME instruction;Gait training;Functional mobility training;Therapeutic activities;Therapeutic exercise;Balance training;Patient/family education    PT Goals (Current goals can be found in the Care Plan section)  Acute Rehab PT Goals Patient Stated Goal: to get better PT Goal Formulation: With patient Time For Goal Achievement: 05/02/19 Potential to Achieve Goals: Good    Frequency Min 4X/week   Barriers to discharge        Co-evaluation               AM-PAC PT "6 Clicks" Mobility  Outcome Measure Help needed turning from your back to your side while in a flat bed without using bedrails?: A Lot Help needed moving from lying  on your back to sitting on the side of a flat bed without using bedrails?: A Lot Help needed moving to and from a bed to a chair (including a wheelchair)?: Total Help needed standing up from a chair using your arms (e.g., wheelchair or bedside chair)?: Total Help needed to walk in hospital room?: Total Help needed climbing 3-5 steps with a railing? : Total 6 Click Score: 8    End of Session Equipment Utilized During Treatment: Gait belt;Oxygen Activity Tolerance: Patient limited by fatigue Patient left: in chair;with call bell/phone within reach;with chair alarm set Nurse Communication: Mobility status PT Visit Diagnosis: Unsteadiness on feet (R26.81);Other abnormalities of gait and mobility (R26.89);Difficulty in walking, not elsewhere classified (R26.2)    Time: [id number] PT Time Calculation (min) (ACUTE ONLY): 36 min   Charges:   PT Evaluation $PT Eval Moderate Complexity: 1 Mod PT Treatments $Therapeutic Activity: 8-22 mins        Cailen Texeira W,PT Acute Rehabilitation Services Pager:  [phone]  Office:  [place] 04/18/2019, 10:52 AM

## 2019-04-18 NOTE — Progress Notes (Signed)
ANTICOAGULATION CONSULT NOTE - Initial Consult  Pharmacy Consult for Heparin Indication: 2/11 LLE DVT and acute stroke   Patient Measurements: Weight: 176 lb 9.4 oz (80.1 kg) Heparin Dosing Weight: 80kg  Vital Signs: Temp: 98.9 F (37.2 C) (02/11 1300) Temp Source: Oral (02/11 1300) BP: 115/53 (02/11 1300) Pulse Rate: 71 (02/11 1416)  Labs: Recent Labs    04/16/19 1358 04/16/19 1358 04/17/19 1002 04/17/19 1002 04/17/19 1012 04/17/19 1012 04/17/19 1557 04/17/19 1630 04/18/19 0822  HGB 13.8   < > 12.7*   < > 12.6*   < > 10.9*  --  11.5*  HCT 40.5   < > 37.8*   < > 37.0*  --  32.0*  --  33.9*  PLT 145*  --  130*  --   --   --   --   --  134*  APTT  --   --   --   --   --   --   --  30  --   LABPROT  --   --   --   --   --   --   --  15.6*  --   INR  --   --   --   --   --   --   --  1.2  --   CREATININE 0.94   < > 0.95  --  0.70  --   --   --  0.79   < > = values in this interval not displayed.    Estimated Creatinine Clearance: 74.8 mL/min (by C-G formula based on SCr of 0.79 mg/dL).  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 Pharmacy consulted for heparin, low goal, no bolus. H/H and plt stable.  Goal of Therapy:  Heparin level 0.3-0.5 units/ml Monitor platelets by anticoagulation protocol: Yes   Plan: Start Heparin infusion 1200 units/hr, no bolus F/u 8hr HL Monitor daily HL, CBC, plt Monitor for signs/symptoms of bleeding     Benetta Spar, PharmD, BCPS, BCCP Clinical Pharmacist  Please check AMION for all [place] phone numbers After 10:00 PM, call [place] [phone]

## 2019-04-18 NOTE — Progress Notes (Signed)
Neuro Interventional Radiology Brief Note:  PA called to unit to follow-up post-procedure yesterday. Patient underwent R MCA clot retrieval with penumbra aspiration achieving a TICI 2C revascularization.  He was transferred to [place] ICU overnight, and now being transferred to [place] floor for ongoing recovery.   Per RN, procedure site soft, non-tender.  No hematoma.  Feet warm.   Note preliminary doppler report shows an acute DVT in the left gastrocnemius.  Patient working with therapy for residual left-sided weakness consistent with right CVA.  His diet has been advanced to D1, nectar think liquids.   Further plans per Neuro.  IR available as needed.   Brynda Greathouse, MS RD PA-C 4:01 PM

## 2019-04-18 NOTE — Anesthesia Preprocedure Evaluation (Signed)
Anesthesia Evaluation  Patient identified by MRN, date of birth, ID band Patient confused    Reviewed: Unable to perform ROS - Chart review onlyPreop documentation limited or incomplete due to emergent nature of procedure.  Airway        Dental   Pulmonary Recent URI , former smoker,  covid pos   breath sounds clear to auscultation       Cardiovascular hypertension,  Rhythm:Regular     Neuro/Psych CVA negative psych ROS   GI/Hepatic GERD  ,  Endo/Other    Renal/GU      Musculoskeletal   Abdominal   Peds  Hematology   Anesthesia Other Findings   Reproductive/Obstetrics                             Anesthesia Physical Anesthesia Plan  ASA: IV and emergent  Anesthesia Plan: General   Post-op Pain Management:    Induction: Intravenous, Rapid sequence and Cricoid pressure planned  PONV Risk Score and Plan: 2 and Treatment may vary due to age or medical condition  Airway Management Planned: Oral ETT  Additional Equipment: Arterial line  Intra-op Plan:   Post-operative Plan: Post-operative intubation/ventilation  Informed Consent:     History available from chart only and Only emergency history available  Plan Discussed with: CRNA and Surgeon  Anesthesia Plan Comments:         Anesthesia Quick Evaluation

## 2019-04-18 NOTE — Evaluation (Signed)
Speech Language Pathology Evaluation Patient Details Name: Henry [name] MRN: [id number] DOB: 12-05-1940 Today's Date: 04/18/2019 Time: [id number] SLP Time Calculation (min) (ACUTE ONLY): 23 min  Problem List:  Patient Active Problem List   Diagnosis Date Noted  . Stroke (cerebrum) ([place]) 04/17/2019  . 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 Acute respiratory failure ([place])   . COVID-19   . 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 Chest wall pain 07/04/2016  . 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 BPH (benign prostatic hyperplasia) 12/01/2015  . Heel pain 05/13/2015  . Inguinal hernia 03/19/2014  . Left hip pain 01/10/2014  . HTN (hypertension) 01/10/2014  . Well adult exam 09/12/2011  . Personal history of colonic polyps 06/07/2010  . GERD 03/16/2010  . COUGH 08/11/2009  . POLYCYTHEMIA 07/01/2009  . Osteoarthritis 04/17/2007  . LOW BACK PAIN 04/17/2007  . LEG PAIN 04/17/2007  . PARESTHESIA 04/17/2007   Past Medical History:  Past Medical History:  Diagnosis Date  . Colon polyps   . COVID-19   . Elevated MCV 2011  . GERD (gastroesophageal reflux disease) 2012  . 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 Osteoarthritis   . Polycythemia 2011   Past Surgical History:  Past Surgical History:  Procedure Laterality Date  . APPENDECTOMY    . COLONOSCOPY    . HERNIA REPAIR     march 2019  . INGUINAL HERNIA REPAIR Left 2015  . LUMBAR LAMINECTOMY/DECOMPRESSION MICRODISCECTOMY N/A 05/20/2016   Procedure: Lumbar Three-Four,L[place]mbar Four-Five,Lumbar Five-Sacral One Laminectomy/Foraminotomy;  Surgeon: Kristeen Miss, MD;  Location: [facility];  Service: Neurosurgery;  Laterality: N/A;  . POLYPECTOMY    . RADIOLOGY WITH ANESTHESIA N/A 04/17/2019   Procedure: IR WITH ANESTHESIA;  Surgeon: Luanne Bras, MD;  Location: [facility];  Service: Radiology;  Laterality: N/A;   HPI:  79 year old male patient admitted  to the intensive care status post acute proximal right MCA stroke on 2/10.  Was outside of window for TPA.  Went to interventional radiology for clot extraction, returned to ICU on ventilator MRI shows Extensive restricted diffusion consistent with acute ischemia involving the right MCA vascular territory. There is T2/FLAIR hyperintensity which corresponds with some, but not all, of the acute ischemic changes as described. Additional punctate acute infarcts within the left parietal and occipital lobes. intubated 2/10-2/11.    Assessment / Plan / Recommendation Clinical Impression  Pt presents with deficits consistent with right hemisphere CVA. Pt gaze is firmly deviated right, requires max verbal and contextual cues to draw eyes and turn head closer to midline; still needed assist to locate cup and cracker offered at midline. His speech is clear, language WNL, cheerful with normal prosody and use of social langauge. He is however poorly awiare of deficits, has difficulty with short term memory and basic functional problem solving. Will need intensive cognitive rehabilitation, recommend CIR. Will follow acutely.     SLP Assessment  SLP Recommendation/Assessment: Patient needs continued Speech Lanaguage Pathology Services SLP Visit Diagnosis: Frontal lobe and executive function deficit Frontal lobe and executive function deficit following: Cerebral infarction    Follow Up Recommendations  Inpatient Rehab    Frequency and Duration min 2x/week  2 weeks      SLP Evaluation Cognition  Overall Cognitive Status: Impaired/Different from baseline Arousal/Alertness: Awake/alert Orientation Level: Oriented to person;Oriented to place;Disoriented to time;Oriented to situation Attention: Focused;Sustained Focused Attention: Appears intact Sustained Attention: Appears intact Memory: Impaired Memory Impairment:  Storage deficit;Retrieval deficit;Decreased short term memory Decreased Short Term Memory:  Verbal basic Awareness: Impaired Awareness Impairment: Intellectual impairment Problem Solving: Impaired Problem Solving Impairment: Functional basic Safety/Judgment: Impaired       Comprehension  Auditory Comprehension Overall Auditory Comprehension: Appears within functional limits for tasks assessed    Expression Verbal Expression Overall Verbal Expression: Appears within functional limits for tasks assessed Written Expression Dominant Hand: Right Written Expression: Not tested   Oral / Motor  Oral Motor/Sensory Function Overall Oral Motor/Sensory Function: Moderate impairment Facial ROM: Reduced left;Suspected CN VII (facial) dysfunction Facial Symmetry: Abnormal symmetry left;Suspected CN VII (facial) dysfunction Facial Strength: Reduced left;Suspected CN VII (facial) dysfunction Lingual ROM: Within Functional Limits Lingual Symmetry: Within Functional Limits Lingual Strength: Within Functional Limits Lingual Sensation: Within Functional Limits Velum: Within Functional Limits Mandible: Within Functional Limits Motor Speech Overall Motor Speech: Appears within functional limits for tasks assessed   GO                   [name], MA [place] Pager [phone] Office [phone]  Lynann Beaver 04/18/2019, 2:11 PM

## 2019-04-18 NOTE — Progress Notes (Signed)
Called pt.'s wife Romar Hornick and daughter [name] and updated them about the pt.'s status.

## 2019-04-18 NOTE — Evaluation (Signed)
Clinical/Bedside Swallow Evaluation Patient Details  Name: Henry [name] MRN: [id number] Date of Birth: 1940/06/02  Today's Date: 04/18/2019 Time: SLP Start Time (ACUTE ONLY): 1240 SLP Stop Time (ACUTE ONLY): 1303 SLP Time Calculation (min) (ACUTE ONLY): 23 min  Past Medical History:  Past Medical History:  Diagnosis Date  . Colon polyps   . COVID-19   . Elevated MCV 2011  . GERD (gastroesophageal reflux disease) 2012  . 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 Osteoarthritis   . Polycythemia 2011   Past Surgical History:  Past Surgical History:  Procedure Laterality Date  . APPENDECTOMY    . COLONOSCOPY    . HERNIA REPAIR     march 2019  . INGUINAL HERNIA REPAIR Left 2015  . LUMBAR LAMINECTOMY/DECOMPRESSION MICRODISCECTOMY N/A 05/20/2016   Procedure: Lumbar Three-Four,Lunmbar Four-Five,Lumbar Five-Sacral One Laminectomy/Foraminotomy;  Surgeon: Kristeen Miss, MD;  Location: [facility];  Service: Neurosurgery;  Laterality: N/A;  . POLYPECTOMY    . 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  Was outside of window for TPA.  Went to interventional radiology for clot extraction, returned to ICU on ventilator MRI shows Extensive restricted diffusion consistent with acute ischemia involving the right MCA vascular territory. There is T2/FLAIR hyperintensity which corresponds with some, but not all, of the acute ischemic changes as described. Additional punctate acute infarcts within the left parietal and occipital lobes. intubated 2/10-2/11.    Assessment / Plan / Recommendation Clinical Impression  Administed 3 oz water swallow resulting in immediate coughing. Pt noted to ahve left CN VII weakness,  and left  inattention/deviated gaze.  When given large sips of nectar thick liquids via straw there was decreased labial seal, but no signs of aspiration. Pt also tolerated puree well. He was able to amsticate a small amount of regualr solids with only mild lingual residue noted; but further observation warranted prior to initiating upgraded solid textures given weakness and impaired cogniton. Recommend pt initiate puree and nectar thick liquids. SLP will f/u for tolerance and potential diet advancement. May need instrumental assessment in the near future.  SLP Visit Diagnosis: Dysphagia, oropharyngeal phase (R13.12)    Aspiration Risk  Moderate aspiration risk    Diet Recommendation Dysphagia 1 (Puree);Nectar-thick liquid   Liquid Administration via: Cup;Straw Medication Administration: Crushed with puree Supervision: Full supervision/cueing for compensatory strategies;Staff to assist with self feeding Compensations: Slow rate;Small sips/bites;Lingual sweep for clearance of pocketing Postural Changes: Seated upright at 90 degrees;Remain upright for at least 30 minutes after po intake    Other  Recommendations Oral Care Recommendations: Oral care BID Other Recommendations: Order thickener from pharmacy;Have oral suction available   Follow up Recommendations Inpatient Rehab      Frequency and Duration min 2x/week  2 weeks       Prognosis Prognosis for Safe Diet Advancement: Good Barriers to Reach Goals: Cognitive deficits      Swallow Study   General HPI: 79 year old male patient admitted to the intensive care status post acute proximal right MCA stroke on 2/10.  Was outside of window for TPA.  Went to interventional radiology for clot extraction, returned to ICU on ventilator MRI shows Extensive restricted diffusion consistent with acute ischemia involving the  right MCA vascular territory. There is T2/FLAIR hyperintensity which corresponds with some, but not all, of the acute ischemic changes as  described. Additional punctate acute infarcts within the left parietal and occipital lobes. intubated 2/10-2/11.  Type of Study: Bedside Swallow Evaluation Diet Prior to this Study: NPO Temperature Spikes Noted: No History of Recent Intubation: Yes Length of Intubations (days): 2 days Date extubated: 04/18/19 Behavior/Cognition: Alert;Cooperative;Pleasant mood;Requires cueing Oral Cavity Assessment: Within Functional Limits Oral Care Completed by SLP: No Oral Cavity - Dentition: Adequate natural dentition Vision: Impaired for self-feeding Self-Feeding Abilities: Needs assist Patient Positioning: Upright in bed Baseline Vocal Quality: Normal Volitional Cough: Strong Volitional Swallow: Able to elicit    Oral/Motor/Sensory Function Overall Oral Motor/Sensory Function: Moderate impairment Facial ROM: Reduced left;Suspected CN VII (facial) dysfunction Facial Symmetry: Abnormal symmetry left;Suspected CN VII (facial) dysfunction Facial Strength: Reduced left;Suspected CN VII (facial) dysfunction Lingual ROM: Within Functional Limits Lingual Symmetry: Within Functional Limits Lingual Strength: Within Functional Limits Lingual Sensation: Within Functional Limits Velum: Within Functional Limits Mandible: Within Functional Limits   Ice Chips Ice chips: Within functional limits   Thin Liquid Thin Liquid: Impaired Presentation: Straw Oral Phase Impairments: Reduced labial seal Pharyngeal  Phase Impairments: Cough - Immediate    Nectar Thick Nectar Thick Liquid: Impaired Presentation: Straw Oral Phase Impairments: Reduced labial seal   Honey Thick Honey Thick Liquid: Not tested   Puree Puree: Within functional limits Presentation: Spoon   Solid     Solid: Impaired Oral Phase Impairments: Reduced labial seal Oral Phase Functional Implications: Oral residue      Henry [name], Henry [name] 04/18/2019,1:25 PM

## 2019-04-18 NOTE — Progress Notes (Signed)
Inpatient Rehabilitation Admissions Coordinator  Inpatient rehab consult received. As per protocol, patient does not meet criteria for admission to CIR until meets post COVID protocol. I will follow at a distance.  Danne Baxter, RN, MSN Rehab Admissions Coordinator [phone] 04/18/2019 12:02 PM

## 2019-04-18 NOTE — Progress Notes (Addendum)
Rehab Admissions Coordinator Note:  Per PT recommendation, this patient was screened by Raechel Ache for appropriateness for an Inpatient Acute Rehab Consult.   Noted pt has a COVID + test from 2/9. Per protocol pt will need to be greater than 20 days post initial positive test or have two negative tests prior being admitted to CIR.  AC will follow along as pt has not yet met the above criteria. Will request consult order IF pt becomes appropriate.   Raechel Ache 04/18/2019, 11:24 AM  I can be reached at [phone].

## 2019-04-18 NOTE — Progress Notes (Signed)
NAME:  CHIRSTOPHER [name], MRN:  [id number], DOB:  10-26-1940, LOS: 1 ADMISSION DATE:  04/17/2019, CONSULTATION DATE: 2/10 REFERRING MD: Dr. Estanislado Pandy CHIEF COMPLAINT: Critical care management status post CVA  Brief History   79 year old male patient admitted to the intensive care status post acute proximal right MCA stroke on 2/10.  Was outside of window for TPA.  Went to interventional radiology for clot extraction, returned to ICU on ventilator critical care asked to assist with postop care.  Note: Seen in ER on 2/9 with positive diagnosis of COVID-19 after approximately 9 to 10-day prodrome of fever, cough, and decreased p.o. intake refused admission  Past Medical History  Gastroesophageal reflux disease, chronic low back pain, hypertension, polycythemia, paresthesia Significant Hospital Events   2/10:  admitted w/ acute Right MCA stroke-->to IR for attempted revascularization/clot extraction.  Return to the intensive care on ventilator, blood pressure goals 120-140, starting remdesivir and Decadron 2/11 no significant issues overnight.  Awake, interactive, follows commands, has a left upper extremity hemiparesis but otherwise all other extremities intact passed spontaneous breathing trial.  Extubated  Consults:   Critical care consulted 2/10 Procedures:  Intubation 2/10, discontinued/extubated 2/11 Attempted revascularization of right proximal MCA 2/10  Significant Diagnostic Tests:   CT head 2/10: 1. 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 ASPECTS 4. Subtle petechial hemorrhage is questioned within an anterior right frontal lobe infarct site. No significant mass effect. 2. Hyperdensity of the M1 right MCA likely reflecting thrombus. 3. Mild generalized parenchymal atrophy and chronic small vessel ischemic disease.1. 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 There is minimal reconstitution of flow within M2 and more  distal right MCA branch vessels.2. No other intracranial large vessel occlusion or proximal high-grade arterial stenosis identified. CT perfusion head 2/10:  The perfusion software identifies a 10 mL core infarct within the right MCA vascular territory. However, please note more extensive infarction changes were appreciated on concurrently performed noncontrast head CT. The perfusion software detects a 147 mL region of hypoperfused parenchyma within the right MCA vascular territory. Reported mismatch 137 mL. MRI brain 2/10: Extensive restricted diffusion consistent with acute ischemia involving the right MCA vascular territory. There is T2/FLAIR hyperintensity which corresponds with some, but not all, of the acute ischemic changes as described. CT brain follow-up 2/10: Ischemic changes of the right MCA territory affecting the cortical brain to level petechial bleeding not excluded however no discrete hematoma or mass-effect despite mild to moderate swelling Lower extremity ultrasound 2/11>>>  Micro Data:  Respiratory viral PCR 2/9: Positive for Covid Urine culture 2/9 Insignificant growth Respiratory culture 2/10>>> Antimicrobials:  Remdesivir 2/10  Interim history/subjective:  Awake and interactive passed his spontaneous breathing trial  Objective   Blood pressure 129/70, pulse (Abnormal) 57, temperature 99.3 F (37.4 C), temperature source Oral, resp. rate (Abnormal) 31, weight 80.1 kg, SpO2 100 %.    Vent Mode: PSV;CPAP FiO2 (%):  [40 %-100 %] 40 % Set Rate:  [16 bmp] 16 bmp Vt Set:  [540 mL] 540 mL PEEP:  [5 cmH20] 5 cmH20 Pressure Support:  [10 cmH20] 10 cmH20 Plateau Pressure:  [17 cmH20-18 cmH20] 17 cmH20   Intake/Output Summary (Last 24 hours) at 04/18/2019 0856 Last data filed at 04/18/2019 0800 Gross per 24 hour  Intake 3515.9 ml  Output 3500 ml  Net 15.9 ml   Filed Weights   04/18/19 0355  Weight: 80.1 kg    Examination:  General this is 79 year old white  male status post CVA he is resting in bed currently on pressure support ventilation and in no acute distress HEENT normocephalic atraumatic orally intubated pupils equal and reactive no JVD appreciated Pulmonary: Clear to auscultation some faint crackles in the bases.  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  Now status post attempted revascularization on 2/10 in interventional radiology ? Post intervention small hemorrhage?? Plan Repeat CT head per stroke team Continue serial neuro checks  Blood pressure goal 120 to 140 for now. Anticipate we can liberalize this later today  Continue statin therapy  serial neuro checks Statin and antiplatelet therapy per stroke team   Acute hypoxic respiratory failure in setting of COVID-19 pneumonia  Portable chest x-ray personally reviewed.  There are bilateral infiltrates however these look improved when comparing from initial admission film Is current F/VT ratio is less than 100, he is exhibiting no significant accessory use D-dimer climbing  Plan Extubate to nasal cannula  Titrate oxygen for saturations greater than 88%  Initiate incentive spirometry  Speech evaluation for swallowing  Day #2 remdesivir, will complete 5 days  Day #2 Decadron will complete 10 days  Out of bed  PT consult  As needed chest x-ray  Getting lower extremity ultrasound to rule out DVT, as D-dimer climbing If okay  with stroke team we should increase low molecular weight heparin to twice daily if no contraindication (stroke team going to repeat CT head first)  Hyperglycemia Plan Continue hyperglycemia protocol Blood glucose goal 140-180  Mild anemia and thrombocytopenia No evidence of bleeding, hemoglobin stable, as are platelets Plan Trend CBC Transfuse for hemoglobin less than 7  Best practice:  Diet: N.p.o. Pain/Anxiety/Delirium protocol (if indicated): Started 2/10, propofol with as needed fentanyl VAP protocol (if indicated): 2/10 DVT prophylaxis: Low molecular weight heparin GI prophylaxis: H2B Glucose control: Sliding scale insulin initiated Mobility: Bedrest Code Status: Full code Family Communication: Wife updated via phone Disposition: Critical care ICU    Critical care time:  33 min    Erick Colace ACNP-BC [place] Pager # [phone] OR # [phone] if no answer

## 2019-04-18 NOTE — Progress Notes (Signed)
Patient trasfered from 14M 08 to [phone] via stretcher; alert and oriented x 3, with left sided weakness noted; no complaints of pain; IV on bilateral AC, skin is intact. Orient patient to room and uni; gave patient care guide; instructed how to use the call bell and  fall risk precautions. Will continue to monitor the patient.

## 2019-04-18 NOTE — Evaluation (Signed)
Occupational Therapy Evaluation Patient Details Name: Henry [name] MRN: [id number] DOB: 26-Oct-1940 Today's Date: 04/18/2019    History of Present Illness 79 year old male patient admitted to the intensive care status post acute proximal right MCA stroke on 2/10.  Was outside of window for TPA.  Went to interventional radiology for clot extraction, returned to ICU on ventilator critical care asked to assist with postop care.   Clinical Impression   PTA, pt was living with his wife and was independent per chart review and pt report. Pt currently requiring Mod A for UB ADLs, Max A for LB ADLs, and Max A for lateral scoot towards HOB. Pt very agreeable to sit at EOB and participate in self feeding of lunch; requiring Mod A for sitting balance and Max cues for locating plate at midline. Pt presenting with left neglect, decreased balance, poor functional use of LUE, visual deficits, and decreased cognition. Pt will require further acute OT to facilitate safe dc. Recommend dc to CIR for intensive OT to optimize safety, independence with ADLs, and return to PLOF.      Follow Up Recommendations  CIR    Equipment Recommendations  Other (comment)(Defer to next venue)    Recommendations for Other Services PT consult     Precautions / Restrictions Precautions Precautions: Fall Restrictions Weight Bearing Restrictions: No      Mobility Bed Mobility Overal bed mobility: Needs Assistance Bed Mobility: Sit to Supine;Supine to Sit     Supine to sit: Max assist Sit to supine: Max assist;+2 for physical assistance   General bed mobility comments: Max A to elevate trunk. Max A +2 for returning to supine.  Transfers Overall transfer level: Needs assistance Equipment used: None Transfers: Lateral/Scoot Transfers          Lateral/Scoot Transfers: Max assist General transfer comment: Max A to scoot towards HOB    Balance Overall balance assessment: Needs assistance Sitting-balance  support: Single extremity supported;Feet supported Sitting balance-Leahy Scale: Poor Sitting balance - Comments: Requires mod assist to min assist to sit EOB as pt has difficulty finding midline position. Leans posteriorly and to his left.   Postural control: Posterior lean;Left lateral lean                                 ADL either performed or assessed with clinical judgement   ADL Overall ADL's : Needs assistance/impaired Eating/Feeding: Moderate assistance;Sitting Eating/Feeding Details (indicate cue type and reason): Mod A for sitting balance. Sitting on left side to bring attention to left side of body and environment. Mod A to locate food. Grooming: Wash/dry face;Sitting;Moderate assistance Grooming Details (indicate cue type and reason): Able to wipe face but missing areas Upper Body Bathing: Moderate assistance;Sitting   Lower Body Bathing: Maximal assistance;Sit to/from stand   Upper Body Dressing : Moderate assistance;Sitting   Lower Body Dressing: Maximal assistance;Sit to/from stand                 General ADL Comments: Focused session on self feeding while sitting at EOB. Pt presenting with decreased attention to left, functional use of LUE, balance, strength, cognition, and safety.      Vision         Perception     Praxis      Pertinent Vitals/Pain Pain Assessment: No/denies pain     Hand Dominance Right   Extremity/Trunk Assessment Upper Extremity Assessment Upper Extremity Assessment: LUE deficits/detail LUE Deficits /  Details: No active movement. Edema. neglect of left side LUE Coordination: decreased fine motor;decreased gross motor   Lower Extremity Assessment LLE Coordination: decreased fine motor;decreased gross motor   Cervical / Trunk Assessment Cervical / Trunk Assessment: Kyphotic   Communication Communication Communication: Expressive difficulties   Cognition Arousal/Alertness: Awake/alert Behavior During Therapy:  Flat affect Overall Cognitive Status: Impaired/Different from baseline Area of Impairment: Orientation;Attention;Following commands;Memory;Safety/judgement;Awareness;Problem solving                 Orientation Level: Disoriented to;Situation;Time Current Attention Level: Focused;Sustained Memory: Decreased short-term memory Following Commands: Follows one step commands inconsistently;Follows one step commands with increased time Safety/Judgement: Decreased awareness of safety;Decreased awareness of deficits Awareness: Intellectual Problem Solving: Slow processing;Decreased initiation;Difficulty sequencing;Requires verbal cues;Requires tactile cues General Comments: Pt with poor attention to left and requiring Max cues to locate food plate for self feeding. Pt also presenting with decreased arousal; benefiting from upright posture   General Comments  SpO2 90s on 2L.    Exercises     Shoulder Instructions      Home Living Family/patient expects to be discharged to:: Private residence Living Arrangements: Spouse/significant other Available Help at Discharge: Family;Available 24 hours/day Type of Home: House Home Access: Stairs to enter [employer] of Steps: 2   Home Layout: One level     Bathroom Shower/Tub: Occupational psychologist: Standard     Home Equipment: None      Lives With: Spouse    Prior Functioning/Environment Level of Independence: Independent                 OT Problem List: Decreased strength;Decreased range of motion;Decreased activity tolerance;Impaired balance (sitting and/or standing);Impaired vision/perception;Decreased cognition;Decreased safety awareness;Decreased knowledge of use of DME or AE;Decreased knowledge of precautions;Pain;Impaired UE functional use      OT Treatment/Interventions: Self-care/ADL training;Therapeutic exercise;Energy conservation;DME and/or AE instruction;Therapeutic activities;Patient/family  education    OT Goals(Current goals can be found in the care plan section) Acute Rehab OT Goals Patient Stated Goal: "Finish eating my food" OT Goal Formulation: With patient Time For Goal Achievement: 05/02/19 Potential to Achieve Goals: Good  OT Frequency: Min 3X/week   Barriers to D/C:            Co-evaluation              AM-PAC OT "6 Clicks" Daily Activity     Outcome Measure Help from another person eating meals?: A Lot Help from another person taking care of personal grooming?: A Lot Help from another person toileting, which includes using toliet, bedpan, or urinal?: Total Help from another person bathing (including washing, rinsing, drying)?: A Lot Help from another person to put on and taking off regular upper body clothing?: A Lot Help from another person to put on and taking off regular lower body clothing?: A Lot 6 Click Score: 11   End of Session Equipment Utilized During Treatment: Oxygen(2L) Nurse Communication: Mobility status  Activity Tolerance: Patient limited by fatigue Patient left: in bed;with call bell/phone within reach(with ultrasound)  OT Visit Diagnosis: Unsteadiness on feet (R26.81);Other abnormalities of gait and mobility (R26.89);Muscle weakness (generalized) (M62.81);Pain                Time: [id number] OT Time Calculation (min): 35 min Charges:  OT General Charges $OT Visit: 1 Visit OT Evaluation $OT Eval Moderate Complexity: 1 Mod OT Treatments $Self Care/Home Management : 8-22 mins  [place], OTR/L Acute Rehab Pager: [phone] Office: [place]  Henry [name] 04/18/2019, 5:16 PM

## 2019-04-18 NOTE — Progress Notes (Signed)
-   Dressing on Right groin post revascularization yesterday is intact and does not have any redness nor swelling noted.  - Patient still has left sided weakness and is Ox3. - Placed on O2 2L via nasal cannula - Will continue to monitor

## 2019-04-19 ENCOUNTER — Inpatient Hospital Stay (HOSPITAL_COMMUNITY): Payer: Medicare HMO

## 2019-04-19 ENCOUNTER — Other Ambulatory Visit (HOSPITAL_COMMUNITY): Payer: Medicare HMO

## 2019-04-19 DIAGNOSIS — G936 Cerebral edema: Secondary | ICD-10-CM

## 2019-04-19 DIAGNOSIS — I10 Essential (primary) hypertension: Secondary | ICD-10-CM

## 2019-04-19 LAB — GLUCOSE, CAPILLARY
Glucose-Capillary: 101 mg/dL — ABNORMAL HIGH (ref 70–99)
Glucose-Capillary: 143 mg/dL — ABNORMAL HIGH (ref 70–99)
Glucose-Capillary: 143 mg/dL — ABNORMAL HIGH (ref 70–99)
Glucose-Capillary: 157 mg/dL — ABNORMAL HIGH (ref 70–99)
Glucose-Capillary: 86 mg/dL (ref 70–99)
Glucose-Capillary: 96 mg/dL (ref 70–99)
Glucose-Capillary: 96 mg/dL (ref 70–99)

## 2019-04-19 LAB — TRIGLYCERIDES: Triglycerides: 84 mg/dL (ref <150)

## 2019-04-19 LAB — CBC
HCT: 33.2 % — ABNORMAL LOW (ref 39.0–52.0)
Hemoglobin: 11.3 g/dL — ABNORMAL LOW (ref 13.0–17.0)
MCH: 37.4 pg — ABNORMAL HIGH (ref 26.0–34.0)
MCHC: 34 g/dL (ref 30.0–36.0)
MCV: 109.9 fL — ABNORMAL HIGH (ref 80.0–100.0)
Platelets: 124 10*3/uL — ABNORMAL LOW (ref 150–400)
RBC: 3.02 MIL/uL — ABNORMAL LOW (ref 4.22–5.81)
RDW: 13.9 % (ref 11.5–15.5)
WBC: 7.8 10*3/uL (ref 4.0–10.5)
nRBC: 0 % (ref 0.0–0.2)

## 2019-04-19 LAB — COMPREHENSIVE METABOLIC PANEL
ALT: 26 U/L (ref 0–44)
AST: 32 U/L (ref 15–41)
Albumin: 2.2 g/dL — ABNORMAL LOW (ref 3.5–5.0)
Alkaline Phosphatase: 70 U/L (ref 38–126)
Anion gap: 7 (ref 5–15)
BUN: 20 mg/dL (ref 8–23)
CO2: 24 mmol/L (ref 22–32)
Calcium: 8.4 mg/dL — ABNORMAL LOW (ref 8.9–10.3)
Chloride: 108 mmol/L (ref 98–111)
Creatinine, Ser: 0.87 mg/dL (ref 0.61–1.24)
GFR calc Af Amer: >60 mL/min (ref >60)
GFR calc non Af Amer: >60 mL/min (ref >60)
Glucose, Bld: 125 mg/dL — ABNORMAL HIGH (ref 70–99)
Potassium: 4.1 mmol/L (ref 3.5–5.1)
Sodium: 139 mmol/L (ref 135–145)
Total Bilirubin: 0.9 mg/dL (ref 0.3–1.2)
Total Protein: 5.7 g/dL — ABNORMAL LOW (ref 6.5–8.1)

## 2019-04-19 LAB — HEPARIN LEVEL (UNFRACTIONATED)
Heparin Unfractionated: <0.1 IU/mL — ABNORMAL LOW (ref 0.30–0.70)
Heparin Unfractionated: 0.23 IU/mL — ABNORMAL LOW (ref 0.30–0.70)
Heparin Unfractionated: 0.7 IU/mL (ref 0.30–0.70)

## 2019-04-19 LAB — D-DIMER, QUANTITATIVE: D-Dimer, Quant: 18.73 ug/mL-FEU — ABNORMAL HIGH (ref 0.00–0.50)

## 2019-04-19 LAB — MAGNESIUM: Magnesium: 2 mg/dL (ref 1.7–2.4)

## 2019-04-19 LAB — C-REACTIVE PROTEIN: CRP: 9.7 mg/dL — ABNORMAL HIGH (ref <1.0)

## 2019-04-19 LAB — FERRITIN: Ferritin: 485 ng/mL — ABNORMAL HIGH (ref 24–336)

## 2019-04-19 LAB — PHOSPHORUS: Phosphorus: 3.8 mg/dL (ref 2.5–4.6)

## 2019-04-19 MED ORDER — INSULIN ASPART 100 UNIT/ML ~~LOC~~ SOLN
0.0000 [IU] | Freq: Three times a day (TID) | SUBCUTANEOUS | Status: DC
  Administered 2019-04-20 (×2): 1 [IU] via SUBCUTANEOUS
  Administered 2019-04-21 – 2019-04-23 (×2): 2 [IU] via SUBCUTANEOUS
  Administered 2019-04-24: 12:00:00 1 [IU] via SUBCUTANEOUS
  Administered 2019-04-25: 2 [IU] via SUBCUTANEOUS

## 2019-04-19 NOTE — Progress Notes (Signed)
STROKE TEAM PROGRESS NOTE   INTERVAL HISTORY Pt lethargic, lying in bed, however, awake alert and following simple commands. 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 MRA showed right M1 patent now.     Vitals:   04/19/19 0730 04/19/19 0800 04/19/19 1257 04/19/19 1430  BP:  118/60 117/61   Pulse:  62 61   Resp:   (!) 22   Temp:   (!) 101.5 F (38.6 C) 98.9 F (37.2 C)  TempSrc:   Axillary Oral  SpO2: (!) 88%  95%   Weight:        CBC:  Recent Labs  Lab 04/17/19 1002 04/17/19 1012 04/18/19 0822 04/19/19 0215  WBC 5.1   < > 6.4 7.8  NEUTROABS 3.5  --  4.7  --   HGB 12.7*   < > 11.5* 11.3*  HCT 37.8*   < > 33.9* 33.2*  MCV 109.6*   < > 108.0* 109.9*  PLT 130*   < > 134* 124*   < > = values in this interval not displayed.    Basic Metabolic Panel:  Recent Labs  Lab 04/18/19 0822 04/19/19 0215  NA 140 139  K 4.1 4.1  CL 108 108  CO2 22 24  GLUCOSE 110* 125*  BUN 15 20  CREATININE 0.79 0.87  CALCIUM 8.1* 8.4*  MG  --  2.0  PHOS  --  3.8   Lipid Panel:     Component Value Date/Time   CHOL 93 04/18/2019 0822   TRIG 84 04/19/2019 0215   HDL 19 (L) 04/18/2019 0822   CHOLHDL 4.9 04/18/2019 0822   VLDL 21 04/18/2019 0822   LDLCALC 53 04/18/2019 0822   HgbA1c:  Lab Results  Component Value Date   HGBA1C 6.8 (H) 04/17/2019    IMAGING past 48 hours CT HEAD WO CONTRAST  Result Date: 04/17/2019 CLINICAL DATA:  Follow-up intervention for right MCA occlusion. EXAM: CT HEAD WITHOUT CONTRAST TECHNIQUE: Contiguous axial images were obtained from the base of the skull through the vertex without intravenous contrast. COMPARISON:  Interventional images same day.  Head CT same day. FINDINGS: Brain: In the region the small core infarction in the lateral temporal lobe and the penumbra of the majority of the right middle cerebral artery cortical territory, there is brain swelling with effacement of the sulci and areas of patchy low-density as well as  some areas of increased attenuation. Low-density present within the right basal ganglia. The appearance of the increased attenuation is more consistent with post procedural contrast staining than intraparenchymal hemorrhage. Hemorrhage is not excluded but not favored. Certainly, there is no frank hematoma. No evidence of midline shift. No hydrocephalus. No extra-axial collection. Vascular: No acute vascular finding primarily. Skull: Negative Sinuses/Orbits: Clear except for mild mucosal thickening. Other: None IMPRESSION: Ischemic changes in the right MCA territory affecting the cortical brain, subcortical brain and right basal ganglia. Areas of low-density as well as areas of indistinct higher density favored to represent contrast staining rather than frank hemorrhage. Low level petechial bleeding is not excluded. There is no discrete hematoma. No mass effect or shift, despite mild-to-moderate swelling in the region of affected brain. Electronically Signed   By: Nelson Chimes M.D.   On: 04/17/2019 20:35   MR ANGIO HEAD WO CONTRAST  Result Date: 04/19/2019 CLINICAL DATA:  Follow-up right MCA occlusion with intervention. EXAM: MRI HEAD WITHOUT CONTRAST MRA HEAD WITHOUT CONTRAST TECHNIQUE: Multiplanar, multiecho pulse sequences of the brain and surrounding structures  were obtained without intravenous contrast. Angiographic images of the head were obtained using MRA technique without contrast. 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 Differences in diffusion appearance is attributed to artifact from petechial hemorrhage. There is progressive swelling with partial effacement of the right lateral ventricle. Midline shift only measures 2 mm. No entrapment. Confluent petechial hemorrhage is seen in the infarcted area. There are a few punctate acute infarcts  in the left occipital parietal region, also seen prior. No extra-axial collection. Vascular: Arterial findings below. Skull and upper cervical spine: Negative Sinuses/Orbits: Negative MRA HEAD FINDINGS Motion degraded. Symmetric carotid and vertebral arteries. Dominant left PICA and right AICA. The vertebral, carotid, and basilar arteries are smooth and widely patent. No branch occlusion or aneurysm. Accounting for motion there is symmetric flow in the right MCA. IMPRESSION: 1. 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 There is progressive swelling with up to 2 mm of midline shift. 2. Redemonstrated minimal acute infarct in left hemispheric white matter. 3. Negative motion degraded MRA.  The right MCA is patent. Electronically Signed   By: Monte Fantasia M.D.   On: 04/19/2019 11:50   MR BRAIN WO CONTRAST  Result Date: 04/19/2019 CLINICAL DATA:  Follow-up right MCA occlusion with intervention. EXAM: MRI HEAD WITHOUT CONTRAST MRA HEAD WITHOUT CONTRAST TECHNIQUE: Multiplanar, multiecho pulse sequences of the brain and surrounding structures were obtained without intravenous contrast. Angiographic images of the head were obtained using MRA technique without contrast. 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 Differences in diffusion appearance is attributed to artifact from petechial hemorrhage. There is progressive swelling with partial effacement of the right lateral ventricle. Midline shift only measures 2 mm. No entrapment. Confluent petechial hemorrhage is seen in the infarcted area. There are a few punctate acute infarcts in the left occipital parietal region, also seen prior. No extra-axial collection. Vascular: Arterial findings below. Skull and upper cervical spine: Negative  Sinuses/Orbits: Negative MRA HEAD FINDINGS Motion degraded. Symmetric carotid and vertebral arteries. Dominant left PICA and right AICA. The vertebral, carotid, and basilar arteries are smooth and widely patent. No branch occlusion or aneurysm. Accounting for motion there is symmetric flow in the right MCA. IMPRESSION: 1. 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 There is progressive swelling with up to 2 mm of midline shift. 2. Redemonstrated minimal acute infarct in left hemispheric white matter. 3. Negative motion degraded MRA.  The right MCA is patent. Electronically Signed   By: Monte Fantasia M.D.   On: 04/19/2019 11:50   DG Chest Port 1 View  Result Date: 04/18/2019 CLINICAL DATA:  Acute respiratory failure EXAM: PORTABLE CHEST 1 VIEW COMPARISON:  Radiograph 04/17/2019 FINDINGS: *Endotracheal tube in the mid trachea, 5.5 cm from the carina. *Telemetry leads overlie the chest. Slight improvement in the bilateral airspace disease most pronounced in the left base and periphery and right mid lung. No new areas of airspace disease. No pneumothorax or visible effusion the portion of the costophrenic sulci on the left is collimated. Cardiomediastinal contours are stable with a calcified aorta. No acute osseous or soft tissue abnormality. IMPRESSION: Stable  satisfactory positioning of the endotracheal tube. Slight interval improvement of bilateral airspace disease. Electronically Signed   By: Lovena Le M.D.   On: 04/18/2019 06:15   DG Chest Port 1 View  Result Date: 04/17/2019 CLINICAL DATA:  COVID-19. Intubation. EXAM: PORTABLE CHEST 1 VIEW COMPARISON:  Radiograph yesterday. FINDINGS: Endotracheal tube tip 5.8 cm from the carina. Probable esophageal temperature probe with tip at the level of the clavicular heads. No definite enteric tube is visualized. Patchy bilateral airspace disease within both lungs, slight worsening in the right midlung  zone from prior. Unchanged heart size and mediastinal contours. No pneumothorax or large pleural effusion. No acute osseous abnormalities are seen. IMPRESSION: 1. Bilateral pneumonia, with slight worsening in the right mid lung since yesterday. 2. Endotracheal tube tip 5.8 cm from the carina. Probable esophageal temperature probe with tip at the clavicular heads. Electronically Signed   By: Keith Rake M.D.   On: 04/17/2019 16:45   VAS [place] LOWER EXTREMITY VENOUS (DVT)  Result Date: 04/18/2019  Lower Venous DVTStudy Indications: Elevated Ddimer.  Risk Factors: COVID 19 positive. Limitations: Bandages, open wound and patient positioning. Comparison Study: No prior studies. Performing Technologist: Oliver Hum RVT  Examination Guidelines: A complete evaluation includes B-mode imaging, spectral Doppler, color Doppler, and power Doppler as needed of all accessible portions of each vessel. Bilateral testing is considered an integral part of a complete examination. Limited examinations for reoccurring indications may be performed as noted. The reflux portion of the exam is performed with the patient in reverse Trendelenburg.  +---------+---------------+---------+-----------+----------+--------------+ RIGHT    CompressibilityPhasicitySpontaneityPropertiesThrombus Aging +---------+---------------+---------+-----------+----------+--------------+ CFV                                                   Not visualized +---------+---------------+---------+-----------+----------+--------------+ SFJ                                                   Not visualized +---------+---------------+---------+-----------+----------+--------------+ FV Prox  Full           Yes      Yes                                 +---------+---------------+---------+-----------+----------+--------------+ FV Mid   Full                                                         +---------+---------------+---------+-----------+----------+--------------+ FV DistalFull                                                        +---------+---------------+---------+-----------+----------+--------------+ PFV      Full                                                        +---------+---------------+---------+-----------+----------+--------------+  POP      Full           Yes      Yes                                 +---------+---------------+---------+-----------+----------+--------------+ PTV      Full                                                        +---------+---------------+---------+-----------+----------+--------------+ PERO     Full                                                        +---------+---------------+---------+-----------+----------+--------------+ Unable to visualize the CFV, SFJ due to bandages.  +---------+---------------+---------+-----------+----------+--------------+ LEFT     CompressibilityPhasicitySpontaneityPropertiesThrombus Aging +---------+---------------+---------+-----------+----------+--------------+ CFV      Full           Yes      Yes                                 +---------+---------------+---------+-----------+----------+--------------+ SFJ      Full                                                        +---------+---------------+---------+-----------+----------+--------------+ FV Prox  Full                                                        +---------+---------------+---------+-----------+----------+--------------+ FV Mid   Full                                                        +---------+---------------+---------+-----------+----------+--------------+ FV DistalFull                                                        +---------+---------------+---------+-----------+----------+--------------+ PFV      Full                                                         +---------+---------------+---------+-----------+----------+--------------+ POP      Full           Yes      Yes                                 +---------+---------------+---------+-----------+----------+--------------+  PTV      Full                                                        +---------+---------------+---------+-----------+----------+--------------+ PERO     Full                                                        +---------+---------------+---------+-----------+----------+--------------+ Gastroc  None                                         Acute          +---------+---------------+---------+-----------+----------+--------------+     Summary: RIGHT: - There is no evidence of deep vein thrombosis in the lower extremity.  - No cystic structure found in the popliteal fossa.  LEFT: - Findings consistent with acute deep vein thrombosis involving the left gastrocnemius veins. - No cystic structure found in the popliteal fossa.  *See table(s) above for measurements and observations. Electronically signed by Servando Snare MD on 04/18/2019 at 6:33:22 PM.    Final    ECHOCARDIOGRAM LIMITED  Result Date: 04/18/2019    ECHOCARDIOGRAM LIMITED REPORT   Patient Name:   Henry [name] Date of Exam: 04/17/2019 Medical Rec #:  [id number]       Height:       68.0 in Accession #:    [id number]      Weight:       180.0 lb Date of Birth:  Sep 10, 1940       BSA:          1.95 m Patient Age:    56 years        BP:           141/51 mmHg Patient Gender: M               HR:           57 bpm. Exam Location:  Inpatient Procedure: Limited Echo and Cardiac Doppler Indications:    Stroke 434.91 / I163.9  History:        Patient has no prior history of Echocardiogram examinations.                 Signs/Symptoms:Chest Pain; Risk Factors:Hypertension. Pneumonia                 due to COVID-19 virus.  Sonographer:    Jaquita Folds Referring Phys: [place]  1. Left ventricular  ejection fraction, by estimation, is 55 to 60%. The left ventricle has normal function. The left ventrical has no regional wall motion abnormalities. Left ventricular diastolic parameters are indeterminate.  2. Right ventricular systolic function is normal. The right ventricular size is normal. There is normal pulmonary artery systolic pressure.  3. The mitral valve is normal in structure and function. trivial mitral valve regurgitation. No evidence of mitral stenosis.  4. The aortic valve is normal in structure and function. Aortic valve regurgitation is not visualized. No aortic stenosis is present. FINDINGS  Left Ventricle: Left ventricular ejection fraction, by estimation,  is 55 to 60%. The left ventricle has normal function. The left ventricle has no regional wall motion abnormalities. There is no left ventricular hypertrophy. Right Ventricle: The right ventricular size is normal. No increase in right ventricular wall thickness. Right ventricular systolic function is normal. There is normal pulmonary artery systolic pressure. The tricuspid regurgitant velocity is 1.86 m/s, and  with an assumed right atrial pressure of 3 mmHg, the estimated right ventricular systolic pressure is 123XX123 mmHg. Mitral Valve: The mitral valve is normal in structure and function. Trivial mitral valve regurgitation. No evidence of mitral valve stenosis. Tricuspid Valve: The tricuspid valve is grossly normal. Tricuspid valve regurgitation is mild. Aortic Valve: The aortic valve is normal in structure and function. Aortic valve regurgitation is not visualized. No aortic stenosis is present. Aorta: The aortic root, ascending aorta and aortic arch are all structurally normal, with no evidence of dilitation or obstruction.  LEFT VENTRICLE PLAX 2D LVIDd:         4.60 cm  Diastology LVIDs:         3.60 cm  LV e' lateral:   8.04 cm/s LV PW:         0.90 cm  LV E/e' lateral: 10.1 LV IVS:        0.90 cm  LV e' medial:    5.91 cm/s LVOT diam:      2.00 cm  LV E/e' medial:  13.8 LV SV:         55.61 ml LV SV Index:   21.50 LVOT Area:     3.14 cm  LEFT ATRIUM             Index LA diam:        4.30 cm 2.20 cm/m LA Vol (A2C):   50.7 ml 25.94 ml/m LA Vol (A4C):   43.8 ml 22.41 ml/m LA Biplane Vol: 48.7 ml 24.92 ml/m  AORTIC VALVE LVOT Vmax:   79.40 cm/s LVOT Vmean:  57.900 cm/s LVOT VTI:    0.177 m  AORTA Ao Root diam: 2.70 cm MITRAL VALVE                        TRICUSPID VALVE MV Area (PHT): 2.50 cm             TR Peak grad:   13.8 mmHg MV Decel Time: 303 msec             TR Vmax:        186.00 cm/s MV E velocity: 81.60 cm/s 103 cm/s MV A velocity: 74.40 cm/s 70.3 cm/s SHUNTS MV E/A ratio:  1.10       1.5       Systemic VTI:  0.18 m                                     Systemic Diam: 2.00 cm Mertie Moores MD Electronically signed by Mertie Moores MD Signature Date/Time: 04/18/2019/5:07:36 PM    Final    Cerebral Angio 04/17/2019 S/P RT common carotid arteriogram  Followed by near complete revascularization of occcluded RT MCA M1 seg with x 1 pass with 25mmx 40 mm solitaire X  retriever and x 1 pass with 23mm x 32 mm Trevoprovue retriever and penumbra aspiration achieving a TICI 2C revascularization.[name]  PHYSICAL EXAM   Temp:  [98.9 F (37.2 C)-101.5 F (38.6 C)] 98.9 F (37.2 C) (02/12  1430) Pulse Rate:  [55-62] 61 (02/12 1257) Resp:  [20-32] 22 (02/12 1257) BP: (104-118)/(60-67) 117/61 (02/12 1257) SpO2:  [6 %-97 %] 95 % (02/12 1257)  General - Well nourished, well developed, in no apparent distress but lethargic.  Ophthalmologic - fundi not visualized due to noncooperation.  Cardiovascular - Regular rhythm and rate.  Mental Status -  Level of arousal and orientation to time, place, and person were intact. Language including expression, naming, repetition, comprehension was assessed and found intact. Mild dysarthria  Cranial Nerves II - XII - II - left visual neglect. III, IV, VI - right gaze preference and not able to cross  midline. V - Facial sensation showed left sensation neglect. VII - left facial droop. VIII - Hearing & vestibular intact bilaterally. X - Palate elevates symmetrically. XI - Shoulder shrug decreased on the left. XII - Tongue protrusion intact.  Motor Strength - The patient's strength was normal RUE and RLE, however, left UE 2/5 proximal and 0/5 distal, left LE 2+/5 proximal and 4/5 distal.  Bulk was normal and fasciculations were absent.   Motor Tone - Muscle tone was assessed at the neck and appendages and was normal.  Reflexes - The patient's reflexes were symmetrical in all extremities and he had no pathological reflexes.  Sensory - Light touch, temperature/pinprick were assessed and diminished on the left with left sensory neglect.    Coordination - The patient had normal movements in the right hand with no ataxia or dysmetria.  Tremor was absent.  Gait and Station - deferred.   ASSESSMENT/PLAN Henry [name] is a 79 y.o. male with history of mild hypertension, low back pain, polycythemia, flu sx x 1 week presenting to ED earlier in the day with  generalized weakness, cough and fever, dx with COVID and left AMA. Later developed left-sided weakness,  neglect and R gaze preference. EMS returned him to the ED.   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 Hypercoagulable from COVID-19 infection vs cardioembolic source  CT head R MCA infarction w/ subtle petechial hemorrhage R frontal lobe. hyperdense R M1. 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 Proximal L ICA 30-40% stenosis. R VA w/ moderate narrowing at origin. L V1 moderate origin stenosis. Lung apices suspicious for interstitial lung dz, nodular opacity R lung apex.  CT perfusion R MCA 63mL core infarct. 123mL penumbra w/ 156mL mismatch.   Limited MRI  Extensive R MCA infarct. Punctate L  parietal and occipital lobe infarcts.  Cerebral angio R M1 occlusion w/ near complete revascularization TICI2c  Repeat CT head 2/10 2035 R MCA cortical, subcortical and BG infarcts with likely contrast staining instead of hemorrhage. No mass effect or shift.   Repeat MRI 2/12 unchanged large R MCA infarct w/ petechial hemorrhage. Progressive edema w/ 87mm midline shift. Small L brain infarct.   Repeat MRA 2/12 R MCA remains patent.   LE venous doppler DVT L gastrocnemius vein  2D Echo EF 55-60%. No source of embolus   Transcranial doppler w/ bubble pending   LDL 53  HgbA1c 6.8  Lovenox 40 mg sq daily for VTE prophylaxis  No antithrombotic prior to admission, now on IV heparin given acute DVT in the setting of high D-dimer and COVID infection  Therapy recommendations:  CIR-> SNF (needs to be > 20 days  since positive COVID for xfer to CIR)  Disposition:  pending   Cerebral edema  MRI showed right to left MLS 71mm  Mild lethargy today  Close neuro monitoring  Consider repeat CT if neuro change   Acute Respiratory Failure COVID-19 PNA  intubated for IR, left intubated after IR d/t COVID  Remdesivir D3/5  Decadron D3/10  Extubate 2/10  D-Dimer 13.53-> >20 ->18.73  CRP 13.5->10->9.7  CCM on board  DVT, distal  LE venous doppler DVT L gastrocnemius vein  On IV heparin given high D-Dimer and COVID infection  Hypertension  Home meds:  None listed . Long-term BP goal normotensive  Dysphagia . Secondary to stroke . Cleared for Dys 1 nectar thick liquids  . 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 This patient's care requiresreview of multiple databases, neurological assessment, discussion with family, other specialists and medical decision making of high complexity.   Rosalin Hawking, MD PhD Stroke Neurology 04/19/2019 2:37 PM    To contact Stroke Continuity provider, please refer to [email or web address] After hours, contact General Neurology

## 2019-04-19 NOTE — Progress Notes (Signed)
  Speech Language Pathology Treatment: Dysphagia;Cognitive-Linquistic  Patient Details Name: Henry [name] MRN: [id number] DOB: [birth year] Today's Date: 04/19/2019 Time: [id number] SLP Time Calculation (min) (ACUTE ONLY): 15 min  Assessment / Plan / Recommendation Clinical Impression  Pt more lethargic this am. Needed constant stimulation to sustain arousal even after chair position in bed. Increased curing needed to initiaite functional task (face washing). Could not engage pt in self feeding or attention to midline. Pt verbalized desire to drink coffee and takes bites of puree, but was not responsive to touch or visual cues, needed max verbal instruction to open mouth. Pt did well with a few teaspoons of nectar thick coffee, but began orally holding puree and it was suctioned from his mouth. Will continue efforts.   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  Was outside of window for TPA.  Went to interventional radiology for clot extraction, returned to ICU on ventilator MRI shows Extensive restricted diffusion consistent with acute ischemia involving the right MCA vascular territory. There is T2/FLAIR hyperintensity which corresponds with some, but not all, of the acute ischemic changes as described. Additional punctate acute infarcts within the left parietal and occipital lobes. intubated 2/10-2/11.       SLP Plan  Continue with current plan of care       Recommendations  Diet recommendations: Dysphagia 1 (puree);Nectar-thick liquid Liquids provided via: Teaspoon;Cup Medication Administration: Crushed with puree Compensations: Slow rate;Small sips/bites;Lingual sweep for clearance of pocketing Postural Changes and/or Swallow Maneuvers: Seated upright 90 degrees                General recommendations: Rehab consult Oral Care Recommendations: Oral care BID Follow up Recommendations: Inpatient Rehab SLP  Visit Diagnosis: Frontal lobe and executive function deficit Frontal lobe and executive function deficit following: Cerebral infarction Plan: Continue with current plan of care       GO               [name], MA [place] Pager [phone] Office [phone]  Lynann Beaver 04/19/2019, 9:39 AM

## 2019-04-19 NOTE — TOC Initial Note (Signed)
Transition of Care [facility]) - Initial/Assessment Note    Patient Details  Name: Henry [name] MRN: [id number] Date of Birth: [birth year]  Transition of Care [facility]) CM/SW Contact:    Henry [name], [place] Work Phone Number: 04/19/2019, 1:41 PM  Clinical Narrative:                 MSW received consult for possible SNF placement at time of discharge. MSW was unable to speak with patient because patient is oriented x2. MSW called spouse, Henry [name] [phone]). She said that she was very sick and to call her daughter about discharge plans. MSW spoke with daughter, Henry [name] [id number])  MSW spoke with patient' daughter regarding PT recommendation of SNF placement at time of discharge. Patient's daughter reported that patient's spouse is currently unable to care for patient at their home given patient's current physical needs and since pt's wife is sick. Henry [name] said that her father needed rehab and expressed understanding of PT recommendation and is agreeable to SNF placement at time of discharge . MSW discussed insurance authorization process and provided Medicare SNF ratings list. Henry [name] wanted an update on her fathers care, MSW said that she would contact nurse for an update. No further questions reported at this time. CSW and MSW will continue to follow and assist with discharge planning needs. CSW will send out referrals at this time.  Expected Discharge Plan: Skilled Nursing Facility Barriers to Discharge: Continued Medical Work up   Patient Goals and CMS Choice Patient states their goals for this hospitalization and ongoing recovery are:: Get well enough to go home CMS Medicare.gov Compare Post Acute Care list provided to:: Patient Represenative (must comment)(Daughter, Henry [name]) Choice offered to / list presented to : Adult Children  Expected Discharge Plan and Services Expected Discharge Plan: [place] In-house Referral: Clinical Social Work Discharge  Planning Services: CM Consult Post Acute Care Choice: [place] arrangements for the past 2 months: [place]                 DME Arranged: N/A DME Agency: NA                  Prior Living Arrangements/Services Living arrangements for the past 2 months: Single Family Home Lives with:: Spouse Patient language and need for interpreter reviewed:: Yes Do you feel safe going back to the place where you live?: Yes      Need for Family Participation in Patient Care: Yes (Comment) Care giver support system in place?: Yes (comment)   Criminal Activity/Legal Involvement Pertinent to Current Situation/Hospitalization: No - Comment as needed  Activities of Daily Living      Permission Sought/Granted Permission sought to share information with : Family Supports Permission granted to share information with : Yes, Release of Information Signed  Share Information with NAME: Henry [name]           Emotional Assessment Appearance:: Other (Comment Required(N/A) Attitude/Demeanor/Rapport: Unable to Assess Affect (typically observed): Unable to Assess Orientation: : Oriented to Self, Oriented to Place Alcohol / Substance Use: Not Applicable Psych Involvement: No (comment)  Admission diagnosis:  Stroke 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 Stroke (cerebrum) ([place]) 04/17/2019  . 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 Acute respiratory failure ([place])   . COVID-19   .  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 Chest wall pain 07/04/2016  . 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 BPH (benign prostatic hyperplasia) 12/01/2015  . Heel pain 05/13/2015  . Inguinal hernia 03/19/2014  . Left hip pain  01/10/2014  . HTN (hypertension) 01/10/2014  . Well adult exam 09/12/2011  . Personal history of colonic polyps 06/07/2010  . GERD 03/16/2010  . COUGH 08/11/2009  . POLYCYTHEMIA 07/01/2009  . Osteoarthritis 04/17/2007  . LOW BACK PAIN 04/17/2007  . LEG PAIN 04/17/2007  . PARESTHESIA 04/17/2007   PCP:  Henry Anger, MD Pharmacy:   [facility], [place] [address] Phone: [phone] Fax: [phone]     Social Determinants of Health (SDOH) Interventions    Readmission Risk Interventions No flowsheet data found.

## 2019-04-19 NOTE — Progress Notes (Signed)
ANTICOAGULATION CONSULT NOTE   Pharmacy Consult for Heparin Indication: 2/11 LLE DVT and acute stroke   Patient Measurements: Weight: 176 lb 9.4 oz (80.1 kg) Heparin Dosing Weight: 80kg  Vital Signs: Temp: 101.5 F (38.6 C) (02/12 1257) Temp Source: Axillary (02/12 1257) BP: 117/61 (02/12 1257) Pulse Rate: 61 (02/12 1257)  Labs: Recent Labs    04/17/19 1002 04/17/19 1002 04/17/19 1012 04/17/19 1012 04/17/19 1557 04/17/19 1557 04/17/19 1630 04/18/19 0822 04/19/19 0210 04/19/19 0215 04/19/19 1202  HGB 12.7*   < > 12.6*   < > 10.9*   < >  --  11.5*  --  11.3*  --   HCT 37.8*   < > 37.0*   < > 32.0*  --   --  33.9*  --  33.2*  --   PLT 130*  --   --   --   --   --   --  134*  --  124*  --   APTT  --   --   --   --   --   --  30  --   --   --   --   LABPROT  --   --   --   --   --   --  15.6*  --   --   --   --   INR  --   --   --   --   --   --  1.2  --   --   --   --   HEPARINUNFRC  --   --   --   --   --   --   --   --  <0.10*  --  0.23*  CREATININE 0.95   < > 0.70  --   --   --   --  0.79  --  0.87  --    < > = values in this interval not displayed.    Estimated Creatinine Clearance: 68.8 mL/min (by C-G formula based on SCr of 0.87 mg/dL).  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 Pharmacy consulted for heparin, low goal, no bolus. H/H and plt stable.  2/12 update:  Heparin level 0.23. Will need increase.   Goal of Therapy:  Heparin level 0.3-0.5 units/ml Monitor platelets by anticoagulation protocol: Yes   Plan: No bolus  Inc heparin drip to 1600 units/hr Re-check heparin level at 2200 Monitor for bleeding   Henry [name], PharmD, BCPS, FNKF Clinical Pharmacist [place] Please utilize Amion for appropriate phone number to reach the unit pharmacist ([place])

## 2019-04-19 NOTE — Progress Notes (Signed)
PROGRESS NOTE                                                                                                                                                                                                             Patient Demographics:    Henry [name], is a 79 y.o. male, DOB - 1940-07-24, ONG:[id number]  Outpatient Primary MD for the patient is [name], [name], MD   Admit date - 04/17/2019   LOS - 2  No chief complaint on file.      Brief Narrative: Patient is a 79 y.o. male with PMHx of HTN, polycythemia, chronic GERD, chronic low back pain who was admitted on 2/10 following a right MCA stroke-patient was taken by IR for revascularization procedure as patient was outside the TPA window-following which he was admitted to the ICU and required ventilator support.  Patient was also found to have COVID-19.  Upon clinical stability-and extubation-patient was transferred to the [facility] on 2/12.  Significant events: 2/10>> admitted with acute right MCA stroke-to IR for revascularization of Right MCA-return to ICU on a ventilator 2/11>> extubated 2/12>> transfer to [facility]   Subjective:    [name] today was lying comfortably in bed-he has dysarthria-obvious left facial droop-he is unable to move his left upper extremity.   Assessment  & Plan :   Acute Hypoxic Resp Failure due to Covid 19 Viral pneumonia: Improved-on 2 L of oxygen.  Extubated 2/12.  CRP slowly downtrending-continue steroids/remdesivir.  Continued attempts to titrate off oxygen.  Fever: afebrile  O2 requirements:  SpO2: (!) 88 % O2 Flow Rate (L/min): 2 L/min FiO2 (%): 28 %   COVID-19 Labs: Recent Labs    04/17/19 1630 04/18/19 0822 04/18/19 1642 04/19/19 0215  DDIMER 13.53* >20.00*  --  18.73*  FERRITIN 487*  --  510* 485*  CRP 13.5*  --  10.0* 9.7*    No results found for: BNP  Recent Labs  Lab 04/17/19 1630    PROCALCITON 0.10    Lab Results  Component Value Date   SARSCOV2NAA POSITIVE (A) 04/16/2019     COVID-19 Medications: Steroids: 2/10>> Remdesivir: 2/10>>  Prone/Incentive Spirometry: encouraged incentive spirometry use 3-4/hour.  DVT Prophylaxis  : IV heparin  Left lower extremity DVT: Likely provoked by COVID-19-on IV heparin.  Large right MCA and punctate left parietal/occipital  lobe infarct: On IV heparin and aspirin.  Patient is s/p revascularization of right M1 occlusion by interventional radiology.  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  Echo with preserved EF and without any embolic foci.  LDL 53, A1c 6.8.  Has gross dysarthria, left facial droop, and significant weakness in the left upper extremity.  Will await further recommendations from stroke MD.  Dysphagia: Secondary to CVA-followed by SLP-on dysphagia 1 diet.  Maintain aspiration precautions  HTN: BP controlled-not on any antihypertensives.  Allow permissive hypertension.  Consults  :  Neuro/PCCM  Procedures  :   ETT:2/10>>2/11 Revascularization of right proximal MCA 2/10  ABG:    Component Value Date/Time   PHART 7.407 04/17/2019 1557   PCO2ART 43.1 04/17/2019 1557   PO2ART 449.0 (H) 04/17/2019 1557   HCO3 27.1 04/17/2019 1557   TCO2 28 04/17/2019 1557   O2SAT 100.0 04/17/2019 1557    Vent Settings: N/A  Condition - Extremely Guarded  Family Communication  :  Daughter updated over the phone 2/12  Code Status :  Full Code  Diet :  Diet Order            DIET - DYS 1 Room service appropriate? No; Fluid consistency: Nectar Thick  Diet effective now               Disposition Plan  :  Remain hospitalized-CIR versus SNF on discharge.  Barriers to discharge: Hypoxia requiring O2 supplementation/complete 5 days of IV Remdesivir.work up for CVA in progress  Antimicorbials  :    Anti-infectives (From admission, onward)   Start     Dose/Rate Route Frequency Ordered Stop   04/18/19 1000   remdesivir 100 mg in sodium chloride 0.9 % 100 mL IVPB     100 mg 200 mL/hr over 30 Minutes Intravenous Daily 04/17/19 1457 04/22/19 0959   04/17/19 1630  remdesivir 200 mg in sodium chloride 0.9% 250 mL IVPB     200 mg 580 mL/hr over 30 Minutes Intravenous Once 04/17/19 1457 04/17/19 1908   04/17/19 1128  ceFAZolin (ANCEF) 2-4 GM/100ML-% IVPB    Note to Pharmacy: Fredric Dine   : cabinet override      04/17/19 1128 04/17/19 1543      Inpatient Medications  Scheduled Meds: .  stroke: mapping our early stages of recovery book   Does not apply Once  . vitamin C  500 mg Per Tube Daily  . chlorhexidine gluconate (MEDLINE KIT)  15 mL Mouth Rinse BID  . Chlorhexidine Gluconate Cloth  6 each Topical Daily  . dexamethasone (DECADRON) injection  6 mg Intravenous Q24H  . folic acid  1 mg Intravenous Daily  . insulin aspart  0-20 Units Subcutaneous Q4H  . mouth rinse  15 mL Mouth Rinse 10 times per day  . thiamine injection  100 mg Intravenous Daily  . zinc sulfate  220 mg Per Tube Daily   Continuous Infusions: . sodium chloride 50 mL/hr at 04/19/19 0730  . heparin 1,500 Units/hr (04/19/19 0439)  . remdesivir 100 mg in NS 100 mL 100 mg (04/19/19 0931)   PRN Meds:.[DISCONTINUED] acetaminophen **OR** acetaminophen (TYLENOL) oral liquid 160 mg/5 mL **OR** [DISCONTINUED] acetaminophen, [DISCONTINUED] acetaminophen **OR** acetaminophen (TYLENOL) oral liquid 160 mg/5 mL **OR** [DISCONTINUED] acetaminophen, hydrALAZINE, iohexol, Resource ThickenUp Clear, senna-docusate   Time Spent in minutes  35   See all Orders from today for further details   Oren Binet M.D on 04/19/2019 at 9:54 AM  To page go to [email or web address] -  use universal password  [facility] -  Office  [phone]    Objective:   Vitals:   04/18/19 2157 04/19/19 0010 04/19/19 0439 04/19/19 0730  BP: 104/62 111/60 113/67   Pulse: (!) 55 60 (!) 59   Resp: (!) 32 20 20   Temp: 99.4 F (37.4 C) 98.9 F (37.2 C)  99.7 F (37.6 C)   TempSrc: Axillary Oral Oral   SpO2: 97% 96% (!) 6% (!) 88%  Weight:        Wt Readings from Last 3 Encounters:  04/18/19 80.1 kg  04/16/19 81.6 kg  08/24/18 81.6 kg     Intake/Output Summary (Last 24 hours) at 04/19/2019 0954 Last data filed at 04/19/2019 0443 Gross per 24 hour  Intake 784.9 ml  Output 1800 ml  Net -1015.1 ml     Physical Exam Gen Exam:Alert awake-dysarthric-looks weak. HEENT:atraumatic, normocephalic Chest: B/L clear to auscultation anteriorly CVS:S1S2 regular Abdomen:soft non tender, non distended Extremities:no edema Neurology: Left upper extremity is 0/5-left lower extremity approximately 3/5.  5/5 on the right side. Skin: no rash   Data Review:    CBC Recent Labs  Lab 04/16/19 1358 04/16/19 1358 04/17/19 1002 04/17/19 1012 04/17/19 1557 04/18/19 0822 04/19/19 0215  WBC 5.3  --  5.1  --   --  6.4 7.8  HGB 13.8   < > 12.7* 12.6* 10.9* 11.5* 11.3*  HCT 40.5   < > 37.8* 37.0* 32.0* 33.9* 33.2*  PLT 145*  --  130*  --   --  134* 124*  MCV 108.6*  --  109.6*  --   --  108.0* 109.9*  MCH 37.0*  --  36.8*  --   --  36.6* 37.4*  MCHC 34.1  --  33.6  --   --  33.9 34.0  RDW 13.7  --  13.7  --   --  14.0 13.9  LYMPHSABS 1.0  --  0.9  --   --  1.0  --   MONOABS 0.5  --  0.5  --   --  0.7  --   EOSABS 0.1  --  0.1  --   --  0.0  --   BASOSABS 0.0  --  0.0  --   --  0.0  --    < > = values in this interval not displayed.    Chemistries  Recent Labs  Lab 04/16/19 1358 04/16/19 1358 04/17/19 1002 04/17/19 1012 04/17/19 1557 04/18/19 0822 04/19/19 0215  NA 134*   < > 136 136 137 140 139  K 4.5   < > 4.0 3.8 4.1 4.1 4.1  CL 102  --  103 102  --  108 108  CO2 21*  --  21*  --   --  22 24  GLUCOSE 120*  --  158* 150*  --  110* 125*  BUN 17  --  18 18  --  15 20  CREATININE 0.94  --  0.95 0.70  --  0.79 0.87  CALCIUM 8.7*  --  8.5*  --   --  8.1* 8.4*  MG  --   --   --   --   --   --  2.0  AST 44*  --  36  --   --  32  32  ALT 40  --  33  --   --  27 26  ALKPHOS 83  --  71  --   --  69  70  BILITOT 1.1  --  1.0  --   --  0.9 0.9   < > = values in this interval not displayed.   ------------------------------------------------------------------------------------------------------------------ Recent Labs    04/18/19 0822 04/19/19 0215  CHOL 93  --   HDL 19*  --   LDLCALC 53  --   TRIG 106 84  CHOLHDL 4.9  --     Lab Results  Component Value Date   HGBA1C 6.8 (H) 04/17/2019   ------------------------------------------------------------------------------------------------------------------ No results for input(s): TSH, T4TOTAL, T3FREE, THYROIDAB in the last 72 hours.  Invalid input(s): FREET3 ------------------------------------------------------------------------------------------------------------------ Recent Labs    04/18/19 1642 04/19/19 0215  FERRITIN 510* 485*    Coagulation profile Recent Labs  Lab 04/17/19 1630  INR 1.2    Recent Labs    04/18/19 0822 04/19/19 0215  DDIMER >20.00* 18.73*    Cardiac Enzymes No results for input(s): CKMB, TROPONINI, MYOGLOBIN in the last 168 hours.  Invalid input(s): CK ------------------------------------------------------------------------------------------------------------------ No results found for: BNP  Micro Results Recent Results (from the past 240 hour(s))  Culture, blood (routine x 2)     Status: None (Preliminary result)   Collection Time: 04/16/19  1:45 PM   Specimen: BLOOD  Result Value Ref Range Status   Specimen Description BLOOD LEFT ANTECUBITAL  Final   Special Requests   Final    BOTTLES DRAWN AEROBIC AND ANAEROBIC Blood Culture results may not be optimal due to an inadequate volume of blood received in culture bottles   Culture   Final    NO GROWTH 3 DAYS Performed at [facility], [place] [address], [place], [place] [address]    Report Status PENDING  Incomplete  Culture, blood (routine x 2)     Status:  None (Preliminary result)   Collection Time: 04/16/19  2:49 PM   Specimen: BLOOD RIGHT HAND  Result Value Ref Range Status   Specimen Description BLOOD RIGHT HAND  Final   Special Requests   Final    BOTTLES DRAWN AEROBIC AND ANAEROBIC Blood Culture results may not be optimal due to an inadequate volume of blood received in culture bottles   Culture   Final    NO GROWTH 3 DAYS Performed at [facility], [place] [address], [place], [place] [address]    Report Status PENDING  Incomplete  Urine culture     Status: Abnormal   Collection Time: 04/16/19  4:45 PM   Specimen: Urine, Random  Result Value Ref Range Status   Specimen Description URINE, RANDOM  Final   Special Requests NONE  Final   Culture (A)  Final    <10,000 COLONIES/mL INSIGNIFICANT GROWTH Performed at [facility], [place] [address], [place], [place] [address]    Report Status 04/17/2019 FINAL  Final  Respiratory Panel by RT PCR (Flu A&B, Covid) -     Status: Abnormal   Collection Time: 04/16/19  5:30 PM  Result Value Ref Range Status   SARS Coronavirus 2 by RT PCR POSITIVE (A) NEGATIVE Final    Comment: RESULT CALLED TO, READ BACK BY AND VERIFIED WITH: Dois Davenport RN [id number] 04/16/19 A BROWNING (NOTE) SARS-CoV-2 target nucleic acids are DETECTED. SARS-CoV-2 RNA is generally detectable in upper respiratory specimens  during the acute phase of infection. Positive results are indicative of the presence of the identified virus, but do not rule out bacterial infection or co-infection with other pathogens not detected by the test. Clinical correlation with patient history and other  diagnostic information is necessary to determine patient infection status. The expected result is Negative. Fact Sheet for Patients:  PinkCheek.be Fact Sheet for Healthcare Providers: GravelBags.it This test is not yet approved or cleared by the [place] FDA and  has been  authorized for detection and/or diagnosis of SARS-CoV-2 by FDA under an Emergency Use Authorization (EUA).  This EUA will remain in effect (meaning this test can be used) fo r the duration of  the COVID-19 declaration under Section 564(b)(1) of the Act, 21 U.S.C. section 360bbb-3(b)(1), unless the authorization is terminated or revoked sooner.    Influenza A by PCR NEGATIVE NEGATIVE Final   Influenza B by PCR NEGATIVE NEGATIVE Final    Comment: (NOTE) The Xpert Xpress SARS-CoV-2/FLU/RSV assay is intended as an aid in  the diagnosis of influenza from Nasopharyngeal swab specimens and  should not be used as a sole basis for treatment. Nasal washings and  aspirates are unacceptable for Xpert Xpress SARS-CoV-2/FLU/RSV  testing. Fact Sheet for Patients: PinkCheek.be Fact Sheet for Healthcare Providers: GravelBags.it This test is not yet approved or cleared by the [place] FDA and  has been authorized for detection and/or diagnosis of SARS-CoV-2 by  FDA under an Emergency Use Authorization (EUA). This EUA will remain  in effect (meaning this test can be used) for the duration of the  Covid-19 declaration under Section 564(b)(1) of the Act, 21  U.S.C. section 360bbb-3(b)(1), unless the authorization is  terminated or revoked. Performed at [facility], [place] [address], [place], [place] [address]   Culture, respiratory (non-expectorated)     Status: None (Preliminary result)   Collection Time: 04/17/19  5:05 PM   Specimen: Tracheal Aspirate; Respiratory  Result Value Ref Range Status   Specimen Description TRACHEAL ASPIRATE  Final   Special Requests NONE  Final   Gram Stain NO WBC SEEN NO ORGANISMS SEEN   Final   Culture   Final    RARE STAPHYLOCOCCUS AUREUS SUSCEPTIBILITIES TO FOLLOW Performed at [facility], [place] [address], [place], [place] [address]    Report Status PENDING  Incomplete    Radiology  Reports CT ANGIO HEAD W OR WO CONTRAST  Result Date: 04/17/2019 CLINICAL DATA:  Stroke, follow-up. EXAM: CT ANGIOGRAPHY HEAD AND NECK CT PERFUSION BRAIN TECHNIQUE: Multidetector CT imaging of the head and neck was performed using the standard protocol during bolus administration of intravenous contrast. Multiplanar CT image reconstructions and MIPs were obtained to evaluate the vascular anatomy. Carotid stenosis measurements (when applicable) are obtained utilizing NASCET criteria, using the distal internal carotid diameter as the denominator. Multiphase CT imaging of the brain was performed following IV bolus contrast injection. Subsequent parametric perfusion maps were calculated using RAPID software. CONTRAST:  130m OMNIPAQUE IOHEXOL 350 MG/ML SOLN COMPARISON:  Noncontrast head CT performed earlier the same day. FINDINGS: CTA NECK FINDINGS Aortic arch: The origins of the innominate and left common carotid arteries are incompletely imaged. The visualized aortic arch is unremarkable. No significant innominate or proximal subclavian stenosis visualized. Right carotid system: CCA patent to the bifurcation without stenosis. Prominent predominantly calcified plaque within the distal common carotid artery and proximal ICA. There is likely greater than 70% stenosis at the origin of the ICA, although exact quantification of stenosis is difficult due to irregularity of calcified plaque. Distal to this the ICA is patent within the neck without stenosis. Left carotid system: CCA patent to the bifurcation without stenosis. Calcified plaque within the distal common carotid artery, carotid bifurcation  and proximal ICA. Resultant 30-40% narrowing of the proximal ICA as compared to the more distal vessel. Distal to this the ICA is patent within the neck without stenosis. Vertebral arteries: Right vertebral artery dominant. 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 Calcified plaque at the origin of the medial V1 left  vertebral artery branch with at least moderate ostial stenosis. More distally the bilateral vertebral arteries are patent within the neck without stenosis. Skeleton: No acute bony abnormality. Cervical spondylosis with C5-C6 and C6-C7 posterior disc osteophytes. Other neck: No neck mass or cervical adenopathy. Upper chest: Paraseptal emphysema within the imaged lung apices. Also within the imaged lung apices but there is interstitial prominence suspicious for interstitial lung disease. There are more nodular opacities within the right upper lobe measuring up to 1.4 cm (series 7, image 56) (series 7, image 99). Review of the MIP images confirms the above findings CTA HEAD FINDINGS Anterior circulation: The intracranial internal carotid arteries are patent with scattered calcified plaque. No more than mild stenosis within these vessels. 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 There is minimal reconstitution of flow seen within M2 and more distal right MCA branch vessels. The left middle cerebral artery is patent without high-grade proximal stenosis. The bilateral anterior cerebral arteries are patent without significant proximal stenosis. Posterior circulation: The intracranial vertebral arteries are patent without significant stenosis, as is the basilar artery. The bilateral posterior cerebral arteries are patent without significant proximal stenosis. A sizable right posterior communicating artery is present. The a left posterior communicating artery is poorly delineated and may be hypoplastic or absent. Venous sinuses: Within limitations of contrast timing, no convincing thrombus. Anatomic variants: As described Review of the MIP images confirms the above findings CT Brain Perfusion Findings: ASPECTS: 4 CBF (<30%) Volume: 46m (in the right MCA vascular territory) Perfusion (Tmax>6.0s) volume: 147 mL (in the right MCA vascular territory) Mismatch Volume: 1371mInfarction Location:Right MCA vascular  territory Findings of a proximal M1 right MCA occlusion and the perfusion results were discussed by telephone at the time of interpretation on 04/17/2019 at 10:38 am to provider Dr. ArLorraine Laxwho verbally acknowledged these results. IMPRESSION: CTA neck: 1. The common and internal carotid arteries are patent within the neck bilaterally. 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 There is likely greater than 70% stenosis at the origin of the right ICA. 30-40% stenosis of the proximal left ICA. 2. The vertebral arteries are patent within the neck bilaterally. The right vertebral artery is dominant with moderate atherosclerotic narrowing at its origin. Duplicated V1 left vertebral artery. There is at least moderate ostial stenosis at the origin of the medial left V1 vertebral artery branch. 3. The partially imaged lung apices demonstrate findings suspicious for interstitial lung disease. Additionally, there are nodular opacities within the right lung apex measuring up to 1.4 cm. Dedicated chest CT is recommended for further evaluation when clinically feasible. CTA head: 1. 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 There is minimal reconstitution of flow within M2 and more distal right MCA branch vessels. 2. No other intracranial large vessel occlusion or proximal high-grade arterial stenosis identified. CT perfusion head: The perfusion software identifies a 10 mL core infarct within the right MCA vascular territory. However, please note more extensive infarction changes were appreciated on concurrently performed noncontrast head CT. The perfusion software detects a 147 mL region of hypoperfused parenchyma within the right MCA vascular territory. Reported mismatch 137 mL. Electronically Signed   By:  Kellie Simmering DO   On: 04/17/2019 11:04   CT HEAD WO CONTRAST  Result Date: 04/17/2019 CLINICAL DATA:  Follow-up intervention for right MCA occlusion. EXAM: CT HEAD WITHOUT CONTRAST  TECHNIQUE: Contiguous axial images were obtained from the base of the skull through the vertex without intravenous contrast. COMPARISON:  Interventional images same day.  Head CT same day. FINDINGS: Brain: In the region the small core infarction in the lateral temporal lobe and the penumbra of the majority of the right middle cerebral artery cortical territory, there is brain swelling with effacement of the sulci and areas of patchy low-density as well as some areas of increased attenuation. Low-density present within the right basal ganglia. The appearance of the increased attenuation is more consistent with post procedural contrast staining than intraparenchymal hemorrhage. Hemorrhage is not excluded but not favored. Certainly, there is no frank hematoma. No evidence of midline shift. No hydrocephalus. No extra-axial collection. Vascular: No acute vascular finding primarily. Skull: Negative Sinuses/Orbits: Clear except for mild mucosal thickening. Other: None IMPRESSION: Ischemic changes in the right MCA territory affecting the cortical brain, subcortical brain and right basal ganglia. Areas of low-density as well as areas of indistinct higher density favored to represent contrast staining rather than frank hemorrhage. Low level petechial bleeding is not excluded. There is no discrete hematoma. No mass effect or shift, despite mild-to-moderate swelling in the region of affected brain. Electronically Signed   By: Nelson Chimes M.D.   On: 04/17/2019 20:35   CT ANGIO NECK W OR WO CONTRAST  Result Date: 04/17/2019 CLINICAL DATA:  Stroke, follow-up. EXAM: CT ANGIOGRAPHY HEAD AND NECK CT PERFUSION BRAIN TECHNIQUE: Multidetector CT imaging of the head and neck was performed using the standard protocol during bolus administration of intravenous contrast. Multiplanar CT image reconstructions and MIPs were obtained to evaluate the vascular anatomy. Carotid stenosis measurements (when applicable) are obtained utilizing  NASCET criteria, using the distal internal carotid diameter as the denominator. Multiphase CT imaging of the brain was performed following IV bolus contrast injection. Subsequent parametric perfusion maps were calculated using RAPID software. CONTRAST:  1110m OMNIPAQUE IOHEXOL 350 MG/ML SOLN COMPARISON:  Noncontrast head CT performed earlier the same day. FINDINGS: CTA NECK FINDINGS Aortic arch: The origins of the innominate and left common carotid arteries are incompletely imaged. The visualized aortic arch is unremarkable. No significant innominate or proximal subclavian stenosis visualized. Right carotid system: CCA patent to the bifurcation without stenosis. Prominent predominantly calcified plaque within the distal common carotid artery and proximal ICA. There is likely greater than 70% stenosis at the origin of the ICA, although exact quantification of stenosis is difficult due to irregularity of calcified plaque. Distal to this the ICA is patent within the neck without stenosis. Left carotid system: CCA patent to the bifurcation without stenosis. Calcified plaque within the distal common carotid artery, carotid bifurcation and proximal ICA. Resultant 30-40% narrowing of the proximal ICA as compared to the more distal vessel. Distal to this the ICA is patent within the neck without stenosis. Vertebral arteries: Right vertebral artery dominant. 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 Calcified plaque at the origin of the medial V1 left vertebral artery branch with at least moderate ostial stenosis. More distally the bilateral vertebral arteries are patent within the neck without stenosis. Skeleton: No acute bony abnormality. Cervical spondylosis with C5-C6 and C6-C7 posterior disc osteophytes. Other neck: No neck mass or cervical adenopathy. Upper chest: Paraseptal emphysema within the imaged lung apices. Also within the imaged lung  apices but there is interstitial prominence suspicious for interstitial  lung disease. There are more nodular opacities within the right upper lobe measuring up to 1.4 cm (series 7, image 56) (series 7, image 99). Review of the MIP images confirms the above findings CTA HEAD FINDINGS Anterior circulation: The intracranial internal carotid arteries are patent with scattered calcified plaque. No more than mild stenosis within these vessels. 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 There is minimal reconstitution of flow seen within M2 and more distal right MCA branch vessels. The left middle cerebral artery is patent without high-grade proximal stenosis. The bilateral anterior cerebral arteries are patent without significant proximal stenosis. Posterior circulation: The intracranial vertebral arteries are patent without significant stenosis, as is the basilar artery. The bilateral posterior cerebral arteries are patent without significant proximal stenosis. A sizable right posterior communicating artery is present. The a left posterior communicating artery is poorly delineated and may be hypoplastic or absent. Venous sinuses: Within limitations of contrast timing, no convincing thrombus. Anatomic variants: As described Review of the MIP images confirms the above findings CT Brain Perfusion Findings: ASPECTS: 4 CBF (<30%) Volume: 28m (in the right MCA vascular territory) Perfusion (Tmax>6.0s) volume: 147 mL (in the right MCA vascular territory) Mismatch Volume: 1385mInfarction Location:Right MCA vascular territory Findings of a proximal M1 right MCA occlusion and the perfusion results were discussed by telephone at the time of interpretation on 04/17/2019 at 10:38 am to provider Dr. ArLorraine Laxwho verbally acknowledged these results. IMPRESSION: CTA neck: 1. The common and internal carotid arteries are patent within the neck bilaterally. 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 There is likely greater than 70% stenosis at the origin of the right ICA.  30-40% stenosis of the proximal left ICA. 2. The vertebral arteries are patent within the neck bilaterally. The right vertebral artery is dominant with moderate atherosclerotic narrowing at its origin. Duplicated V1 left vertebral artery. There is at least moderate ostial stenosis at the origin of the medial left V1 vertebral artery branch. 3. The partially imaged lung apices demonstrate findings suspicious for interstitial lung disease. Additionally, there are nodular opacities within the right lung apex measuring up to 1.4 cm. Dedicated chest CT is recommended for further evaluation when clinically feasible. CTA head: 1. 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 There is minimal reconstitution of flow within M2 and more distal right MCA branch vessels. 2. No other intracranial large vessel occlusion or proximal high-grade arterial stenosis identified. CT perfusion head: The perfusion software identifies a 10 mL core infarct within the right MCA vascular territory. However, please note more extensive infarction changes were appreciated on concurrently performed noncontrast head CT. The perfusion software detects a 147 mL region of hypoperfused parenchyma within the right MCA vascular territory. Reported mismatch 137 mL. Electronically Signed   By: KyKellie SimmeringO   On: 04/17/2019 11:04   MR BRAIN WO CONTRAST  Result Date: 04/17/2019 CLINICAL DATA:  Focal neuro deficit, greater than 6 hours, stroke suspected. EXAM: MRI HEAD WITHOUT CONTRAST TECHNIQUE: Multiplanar, multiecho pulse sequences of the brain and surrounding structures were obtained without intravenous contrast. COMPARISON:  Noncontrast CT head, CT angiogram head/neck and CT perfusion performed earlier the same day 04/17/2019 FINDINGS: Brain: A limited protocol brain MRI was performed at the ordering provider's request consisting of only axial diffusion-weighted imaging and axial T2 FLAIR imaging. There is extensive multifocal restricted  diffusion within the right MCA vascular territory, consistent with acute  ischemic changes and involving portions of the right frontal, parietal, temporal and occipital lobes as well as right insular cortex, posterior limb of right internal capsule, right lentiform nucleus, right caudate body and right corona radiata. Of note, this includes acute ischemic change within the right motor strip. The largest region of diffusion weighted signal abnormality within the right temporal lobe measures 7.8 x 3.0 cm in transaxial dimensions (series 4, image 23). There is T2/FLAIR hyperintensity which corresponds with some, but not all, of the acute ischemic change. For instance, there is little to no T2/FLAIR hyperintensity corresponding with the ischemic changes within the right corona radiata, basal ganglia and right insula at this time. Punctate acute cortical infarct within the left parietooccipital lobe (series 4, image 33). Additional probable punctate acute infarcts more superiorly within the left parietal lobe (series 4, image 35) and within the lateral left occipital lobe (series 4, image 24). Vascular: Poorly assessed on the acquired sequences. Skull and upper cervical spine: No evidence of focal marrow lesion on the acquired sequences. Sinuses/Orbits: Visualized orbits demonstrate no acute abnormality. Minimal ethmoid sinus mucosal thickening. No significant mastoid effusion. IMPRESSION: Extensive restricted diffusion consistent with acute ischemia involving the right MCA vascular territory. There is T2/FLAIR hyperintensity which corresponds with some, but not all, of the acute ischemic changes as described. Additional punctate acute infarcts within the left parietal and occipital lobes. Electronically Signed   By: Kellie Simmering DO   On: 04/17/2019 12:50   CT CEREBRAL PERFUSION W CONTRAST  Result Date: 04/17/2019 CLINICAL DATA:  Stroke, follow-up. EXAM: CT ANGIOGRAPHY HEAD AND NECK CT PERFUSION BRAIN TECHNIQUE:  Multidetector CT imaging of the head and neck was performed using the standard protocol during bolus administration of intravenous contrast. Multiplanar CT image reconstructions and MIPs were obtained to evaluate the vascular anatomy. Carotid stenosis measurements (when applicable) are obtained utilizing NASCET criteria, using the distal internal carotid diameter as the denominator. Multiphase CT imaging of the brain was performed following IV bolus contrast injection. Subsequent parametric perfusion maps were calculated using RAPID software. CONTRAST:  113m OMNIPAQUE IOHEXOL 350 MG/ML SOLN COMPARISON:  Noncontrast head CT performed earlier the same day. FINDINGS: CTA NECK FINDINGS Aortic arch: The origins of the innominate and left common carotid arteries are incompletely imaged. The visualized aortic arch is unremarkable. No significant innominate or proximal subclavian stenosis visualized. Right carotid system: CCA patent to the bifurcation without stenosis. Prominent predominantly calcified plaque within the distal common carotid artery and proximal ICA. There is likely greater than 70% stenosis at the origin of the ICA, although exact quantification of stenosis is difficult due to irregularity of calcified plaque. Distal to this the ICA is patent within the neck without stenosis. Left carotid system: CCA patent to the bifurcation without stenosis. Calcified plaque within the distal common carotid artery, carotid bifurcation and proximal ICA. Resultant 30-40% narrowing of the proximal ICA as compared to the more distal vessel. Distal to this the ICA is patent within the neck without stenosis. Vertebral arteries: Right vertebral artery dominant. 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 Calcified plaque at the origin of the medial V1 left vertebral artery branch with at least moderate ostial stenosis. More distally the bilateral vertebral arteries are patent within the neck without stenosis. Skeleton: No acute  bony abnormality. Cervical spondylosis with C5-C6 and C6-C7 posterior disc osteophytes. Other neck: No neck mass or cervical adenopathy. Upper chest: Paraseptal emphysema within the imaged lung apices. Also within the imaged lung apices but there is interstitial  prominence suspicious for interstitial lung disease. There are more nodular opacities within the right upper lobe measuring up to 1.4 cm (series 7, image 56) (series 7, image 99). Review of the MIP images confirms the above findings CTA HEAD FINDINGS Anterior circulation: The intracranial internal carotid arteries are patent with scattered calcified plaque. No more than mild stenosis within these vessels. 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 There is minimal reconstitution of flow seen within M2 and more distal right MCA branch vessels. The left middle cerebral artery is patent without high-grade proximal stenosis. The bilateral anterior cerebral arteries are patent without significant proximal stenosis. Posterior circulation: The intracranial vertebral arteries are patent without significant stenosis, as is the basilar artery. The bilateral posterior cerebral arteries are patent without significant proximal stenosis. A sizable right posterior communicating artery is present. The a left posterior communicating artery is poorly delineated and may be hypoplastic or absent. Venous sinuses: Within limitations of contrast timing, no convincing thrombus. Anatomic variants: As described Review of the MIP images confirms the above findings CT Brain Perfusion Findings: ASPECTS: 4 CBF (<30%) Volume: 18m (in the right MCA vascular territory) Perfusion (Tmax>6.0s) volume: 147 mL (in the right MCA vascular territory) Mismatch Volume: 1317mInfarction Location:Right MCA vascular territory Findings of a proximal M1 right MCA occlusion and the perfusion results were discussed by telephone at the time of interpretation on 04/17/2019 at 10:38 am to provider  Dr. ArLorraine Laxwho verbally acknowledged these results. IMPRESSION: CTA neck: 1. The common and internal carotid arteries are patent within the neck bilaterally. 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 There is likely greater than 70% stenosis at the origin of the right ICA. 30-40% stenosis of the proximal left ICA. 2. The vertebral arteries are patent within the neck bilaterally. The right vertebral artery is dominant with moderate atherosclerotic narrowing at its origin. Duplicated V1 left vertebral artery. There is at least moderate ostial stenosis at the origin of the medial left V1 vertebral artery branch. 3. The partially imaged lung apices demonstrate findings suspicious for interstitial lung disease. Additionally, there are nodular opacities within the right lung apex measuring up to 1.4 cm. Dedicated chest CT is recommended for further evaluation when clinically feasible. CTA head: 1. 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 There is minimal reconstitution of flow within M2 and more distal right MCA branch vessels. 2. No other intracranial large vessel occlusion or proximal high-grade arterial stenosis identified. CT perfusion head: The perfusion software identifies a 10 mL core infarct within the right MCA vascular territory. However, please note more extensive infarction changes were appreciated on concurrently performed noncontrast head CT. The perfusion software detects a 147 mL region of hypoperfused parenchyma within the right MCA vascular territory. Reported mismatch 137 mL. Electronically Signed   By: KyKellie SimmeringO   On: 04/17/2019 11:04   DG Chest Port 1 View  Result Date: 04/18/2019 CLINICAL DATA:  Acute respiratory failure EXAM: PORTABLE CHEST 1 VIEW COMPARISON:  Radiograph 04/17/2019 FINDINGS: *Endotracheal tube in the mid trachea, 5.5 cm from the carina. *Telemetry leads overlie the chest. Slight improvement in the bilateral airspace disease most  pronounced in the left base and periphery and right mid lung. No new areas of airspace disease. No pneumothorax or visible effusion the portion of the costophrenic sulci on the left is collimated. Cardiomediastinal contours are stable with a calcified aorta. No acute osseous or soft tissue abnormality. IMPRESSION: Stable satisfactory positioning of the endotracheal  tube. Slight interval improvement of bilateral airspace disease. Electronically Signed   By: Lovena Le M.D.   On: 04/18/2019 06:15   DG Chest Port 1 View  Result Date: 04/17/2019 CLINICAL DATA:  COVID-19. Intubation. EXAM: PORTABLE CHEST 1 VIEW COMPARISON:  Radiograph yesterday. FINDINGS: Endotracheal tube tip 5.8 cm from the carina. Probable esophageal temperature probe with tip at the level of the clavicular heads. No definite enteric tube is visualized. Patchy bilateral airspace disease within both lungs, slight worsening in the right midlung zone from prior. Unchanged heart size and mediastinal contours. No pneumothorax or large pleural effusion. No acute osseous abnormalities are seen. IMPRESSION: 1. Bilateral pneumonia, with slight worsening in the right mid lung since yesterday. 2. Endotracheal tube tip 5.8 cm from the carina. Probable esophageal temperature probe with tip at the clavicular heads. Electronically Signed   By: Keith Rake M.D.   On: 04/17/2019 16:45   DG Chest Portable 1 View  Result Date: 04/16/2019 CLINICAL DATA:  Cough and fever. New weakness. EXAM: PORTABLE CHEST 1 VIEW COMPARISON:  08/11/2009 FINDINGS: Normal sized heart. Interval diffuse prominence of the interstitial markings and patchy opacities in the mid and lower lung zones bilaterally. No visible pleural fluid. Diffuse osteo IMPRESSION: Interval probable bilateral pneumonia and pneumonitis. Electronically Signed   By: Claudie Revering M.D.   On: 04/16/2019 14:28   CT HEAD CODE STROKE WO CONTRAST  Result Date: 04/17/2019 CLINICAL DATA:  Code stroke.  Left-sided facial droop/weakness, last known well noon yesterday EXAM: CT HEAD WITHOUT CONTRAST TECHNIQUE: Contiguous axial images were obtained from the base of the skull through the vertex without intravenous contrast. COMPARISON:  No pertinent prior studies available for comparison. 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 Subtle petechial hemorrhage is questioned within a region of infarction within the anterior right frontal lobe (series 2, image 19). No significant mass effect or midline shift. No extra-axial fluid collection. Background mild ill-defined hypoattenuation within the cerebral white matter is nonspecific, but consistent with chronic small vessel ischemic disease. Mild generalized parenchymal atrophy. Vascular: Abnormal hyperdensity of the M1 right MCA likely reflecting thrombus. Atherosclerotic calcifications. Skull: No calvarial fracture or suspicious osseous lesion. Sinuses/Orbits: Visualized orbits demonstrate no acute abnormality. Mild paranasal sinus mucosal thickening. No significant mastoid effusion. ASPECTS ([place] Stroke Program Early CT Score) - Ganglionic level infarction (caudate, lentiform nuclei, internal capsule, insula, M1-M3 cortex): 4 (points subtracted for M2, M3 and insula) - Supraganglionic infarction (M4-M6 cortex): 0 Total score (0-10 with 10 being normal): 4 These results were communicated to Dr. Lorraine Lax At 10:38 amon 2/10/2021by text page via the [facility] messaging system. IMPRESSION: 1. 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 ASPECTS 4. Subtle petechial hemorrhage is questioned within an anterior right frontal lobe infarct site. No significant mass effect. 2. Hyperdensity of the M1 right MCA likely  reflecting thrombus. 3. Mild generalized parenchymal atrophy and chronic small vessel ischemic disease. Electronically Signed   By: Kellie Simmering DO   On: 04/17/2019 10:38   VAS [place] LOWER EXTREMITY VENOUS (DVT)  Result Date: 04/18/2019  Lower Venous DVTStudy Indications: Elevated Ddimer.  Risk Factors: COVID 19 positive. Limitations: Bandages, open wound and patient positioning. Comparison Study: No prior studies. Performing Technologist: Oliver Hum RVT  Examination Guidelines: A complete evaluation includes B-mode imaging, spectral Doppler, color  Doppler, and power Doppler as needed of all accessible portions of each vessel. Bilateral testing is considered an integral part of a complete examination. Limited examinations for reoccurring indications may be performed as noted. The reflux portion of the exam is performed with the patient in reverse Trendelenburg.  +---------+---------------+---------+-----------+----------+--------------+ RIGHT    CompressibilityPhasicitySpontaneityPropertiesThrombus Aging +---------+---------------+---------+-----------+----------+--------------+ CFV                                                   Not visualized +---------+---------------+---------+-----------+----------+--------------+ SFJ                                                   Not visualized +---------+---------------+---------+-----------+----------+--------------+ FV Prox  Full           Yes      Yes                                 +---------+---------------+---------+-----------+----------+--------------+ FV Mid   Full                                                        +---------+---------------+---------+-----------+----------+--------------+ FV DistalFull                                                        +---------+---------------+---------+-----------+----------+--------------+ PFV      Full                                                         +---------+---------------+---------+-----------+----------+--------------+ POP      Full           Yes      Yes                                 +---------+---------------+---------+-----------+----------+--------------+ PTV      Full                                                        +---------+---------------+---------+-----------+----------+--------------+ PERO     Full                                                        +---------+---------------+---------+-----------+----------+--------------+ Unable to visualize the CFV, SFJ due to bandages.  +---------+---------------+---------+-----------+----------+--------------+ LEFT  CompressibilityPhasicitySpontaneityPropertiesThrombus Aging +---------+---------------+---------+-----------+----------+--------------+ CFV      Full           Yes      Yes                                 +---------+---------------+---------+-----------+----------+--------------+ SFJ      Full                                                        +---------+---------------+---------+-----------+----------+--------------+ FV Prox  Full                                                        +---------+---------------+---------+-----------+----------+--------------+ FV Mid   Full                                                        +---------+---------------+---------+-----------+----------+--------------+ FV DistalFull                                                        +---------+---------------+---------+-----------+----------+--------------+ PFV      Full                                                        +---------+---------------+---------+-----------+----------+--------------+ POP      Full           Yes      Yes                                 +---------+---------------+---------+-----------+----------+--------------+ PTV      Full                                                         +---------+---------------+---------+-----------+----------+--------------+ PERO     Full                                                        +---------+---------------+---------+-----------+----------+--------------+ Gastroc  None                                         Acute          +---------+---------------+---------+-----------+----------+--------------+  Summary: RIGHT: - There is no evidence of deep vein thrombosis in the lower extremity.  - No cystic structure found in the popliteal fossa.  LEFT: - Findings consistent with acute deep vein thrombosis involving the left gastrocnemius veins. - No cystic structure found in the popliteal fossa.  *See table(s) above for measurements and observations. Electronically signed by Servando Snare MD on 04/18/2019 at 6:33:22 PM.    Final    ECHOCARDIOGRAM LIMITED  Result Date: 04/18/2019    ECHOCARDIOGRAM LIMITED REPORT   Patient Name:   HARSHA YUSKO Date of Exam: 04/17/2019 Medical Rec #:  [id number]       Height:       68.0 in Accession #:    [id number]      Weight:       180.0 lb Date of Birth:  04/09/1940       BSA:          1.95 m Patient Age:    50 years        BP:           141/51 mmHg Patient Gender: M               HR:           57 bpm. Exam Location:  Inpatient Procedure: Limited Echo and Cardiac Doppler Indications:    Stroke 434.91 / I163.9  History:        Patient has no prior history of Echocardiogram examinations.                 Signs/Symptoms:Chest Pain; Risk Factors:Hypertension. Pneumonia                 due to COVID-19 virus.  Sonographer:    Jaquita Folds Referring Phys: [place]  1. Left ventricular ejection fraction, by estimation, is 55 to 60%. The left ventricle has normal function. The left ventrical has no regional wall motion abnormalities. Left ventricular diastolic parameters are indeterminate.  2. Right ventricular systolic function is normal. The right ventricular size is normal. There is  normal pulmonary artery systolic pressure.  3. The mitral valve is normal in structure and function. trivial mitral valve regurgitation. No evidence of mitral stenosis.  4. The aortic valve is normal in structure and function. Aortic valve regurgitation is not visualized. No aortic stenosis is present. FINDINGS  Left Ventricle: Left ventricular ejection fraction, by estimation, is 55 to 60%. The left ventricle has normal function. The left ventricle has no regional wall motion abnormalities. There is no left ventricular hypertrophy. Right Ventricle: The right ventricular size is normal. No increase in right ventricular wall thickness. Right ventricular systolic function is normal. There is normal pulmonary artery systolic pressure. The tricuspid regurgitant velocity is 1.86 m/s, and  with an assumed right atrial pressure of 3 mmHg, the estimated right ventricular systolic pressure is 28.7 mmHg. Mitral Valve: The mitral valve is normal in structure and function. Trivial mitral valve regurgitation. No evidence of mitral valve stenosis. Tricuspid Valve: The tricuspid valve is grossly normal. Tricuspid valve regurgitation is mild. Aortic Valve: The aortic valve is normal in structure and function. Aortic valve regurgitation is not visualized. No aortic stenosis is present. Aorta: The aortic root, ascending aorta and aortic arch are all structurally normal, with no evidence of dilitation or obstruction.  LEFT VENTRICLE PLAX 2D LVIDd:         4.60 cm  Diastology LVIDs:  3.60 cm  LV e' lateral:   8.04 cm/s LV PW:         0.90 cm  LV E/e' lateral: 10.1 LV IVS:        0.90 cm  LV e' medial:    5.91 cm/s LVOT diam:     2.00 cm  LV E/e' medial:  13.8 LV SV:         55.61 ml LV SV Index:   21.50 LVOT Area:     3.14 cm  LEFT ATRIUM             Index LA diam:        4.30 cm 2.20 cm/m LA Vol (A2C):   50.7 ml 25.94 ml/m LA Vol (A4C):   43.8 ml 22.41 ml/m LA Biplane Vol: 48.7 ml 24.92 ml/m  AORTIC VALVE LVOT Vmax:    79.40 cm/s LVOT Vmean:  57.900 cm/s LVOT VTI:    0.177 m  AORTA Ao Root diam: 2.70 cm MITRAL VALVE                        TRICUSPID VALVE MV Area (PHT): 2.50 cm             TR Peak grad:   13.8 mmHg MV Decel Time: 303 msec             TR Vmax:        186.00 cm/s MV E velocity: 81.60 cm/s 103 cm/s MV A velocity: 74.40 cm/s 70.3 cm/s SHUNTS MV E/A ratio:  1.10       1.5       Systemic VTI:  0.18 m                                     Systemic Diam: 2.00 cm Mertie Moores MD Electronically signed by Mertie Moores MD Signature Date/Time: 04/18/2019/5:07:36 PM    Final

## 2019-04-19 NOTE — Progress Notes (Signed)
ANTICOAGULATION CONSULT NOTE   Pharmacy Consult for Heparin Indication: 2/11 LLE DVT and acute stroke   Patient Measurements: Weight: 176 lb 9.4 oz (80.1 kg) Heparin Dosing Weight: 80kg  Vital Signs: Temp: 99.3 F (37.4 C) (02/12 2010) Temp Source: Axillary (02/12 2010) BP: 117/61 (02/12 1257) Pulse Rate: 61 (02/12 1257)  Labs: Recent Labs    04/17/19 1002 04/17/19 1002 04/17/19 1012 04/17/19 1012 04/17/19 1557 04/17/19 1557 04/17/19 1630 04/18/19 0822 04/19/19 0210 04/19/19 0215 04/19/19 1202 04/19/19 2249  HGB 12.7*   < > 12.6*   < > 10.9*   < >  --  11.5*  --  11.3*  --   --   HCT 37.8*   < > 37.0*   < > 32.0*  --   --  33.9*  --  33.2*  --   --   PLT 130*  --   --   --   --   --   --  134*  --  124*  --   --   APTT  --   --   --   --   --   --  30  --   --   --   --   --   LABPROT  --   --   --   --   --   --  15.6*  --   --   --   --   --   INR  --   --   --   --   --   --  1.2  --   --   --   --   --   HEPARINUNFRC  --   --   --   --   --   --   --   --  <0.10*  --  0.23* 0.70  CREATININE 0.95   < > 0.70  --   --   --   --  0.79  --  0.87  --   --    < > = values in this interval not displayed.    Estimated Creatinine Clearance: 68.8 mL/min (by C-G formula based on SCr of 0.87 mg/dL).  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 Pharmacy consulted for heparin, low goal, no bolus. H/H and plt stable.  2/12 PM update:  Heparin level above goal tonight No issues per RN  Goal of Therapy:  Heparin level 0.3-0.5 units/ml Monitor platelets by anticoagulation protocol: Yes   Plan: Dec heparin to 1450 units/hr Re-check heparin level in 8 hours Monitor for bleeding   Narda Bonds, PharmD, BCPS Clinical Pharmacist Phone: [phone]

## 2019-04-19 NOTE — Progress Notes (Signed)
Pt.'s daughter [name] updated as to patient's status.

## 2019-04-19 NOTE — Progress Notes (Addendum)
Physical Therapy Treatment Patient Details Name: Henry [name] MRN: [id number] DOB: June 12, 1940 Today's Date: 04/19/2019    History of Present Illness 79 year old male patient admitted to the intensive care status post acute proximal right MCA stroke on 2/10.  Was outside of window for TPA.  Went to interventional radiology for clot extraction, returned to ICU on ventilator critical care asked to assist with postop care.    PT Comments    Pt very lethargic on entry with difficulty opening eyes, however with increased verbal tactile stimulation agreeable to work with therapy. Pt continues to be limited in safe mobility by L sided weakness UE>LE, L sided neglect associated decreased balance. Pt requires maxAx2 for bed mobility. Focus of session sitting balance, weightbearing through L UE, and attention to L side. PT continues to believe CIR would offer him the best rehab for his deficits however due to the fact that he will likely discharge before he becomes eligible for CIR due to his COVID status, PT recommends SNF level rehab. PT will continue to follow acutely.      Follow Up Recommendations  CIR;Supervision/Assistance - 24 hour     Equipment Recommendations  Other (comment)(TBA)    Recommendations for Other Services Rehab consult     Precautions / Restrictions Precautions Precautions: Fall Restrictions Weight Bearing Restrictions: No    Mobility  Bed Mobility Overal bed mobility: Needs Assistance Bed Mobility: Sit to Supine;Supine to Sit     Supine to sit: Max assist Sit to supine: Max assist;+2 for physical assistance   General bed mobility comments: pt able to move R LE to EOB assist for L LE movement Max A to elevate trunk. Max A +2 for returning to supine.              Modified Rankin (Stroke Patients Only) Modified Rankin (Stroke Patients Only) Pre-Morbid Rankin Score: No symptoms Modified Rankin: Severe disability     Balance Overall balance assessment:  Needs assistance Sitting-balance support: Single extremity supported;Feet supported Sitting balance-Leahy Scale: Poor Sitting balance - Comments: Requires mod assist to min assist to sit EOB as pt has difficulty finding midline position. Leans posteriorly and to his left.   Postural control: Posterior lean;Left lateral lean                                  Cognition Arousal/Alertness: Awake/alert Behavior During Therapy: Flat affect Overall Cognitive Status: Impaired/Different from baseline Area of Impairment: Orientation;Attention;Following commands;Memory;Safety/judgement;Awareness;Problem solving                 Orientation Level: Disoriented to;Situation;Time Current Attention Level: Focused;Sustained Memory: Decreased short-term memory Following Commands: Follows one step commands inconsistently;Follows one step commands with increased time Safety/Judgement: Decreased awareness of safety;Decreased awareness of deficits Awareness: Intellectual Problem Solving: Slow processing;Decreased initiation;Difficulty sequencing;Requires verbal cues;Requires tactile cues General Comments: poor attention to left       Exercises Other Exercises Other Exercises: sat EoB for 20 minutes working on balance, weightbearing through L UE, both elbow and outstretched arm with elbow held in extension, practiced reaching R UE across midline and tracking objects/people across midline      General Comments General comments (skin integrity, edema, etc.): Requires increased cuing to look across midline, SaO2 >90%O2       Pertinent Vitals/Pain Pain Assessment: No/denies pain           PT Goals (current goals can now be found in the care  plan section) Acute Rehab PT Goals Patient Stated Goal: see his wife PT Goal Formulation: With patient Time For Goal Achievement: 05/02/19 Potential to Achieve Goals: Good Progress towards PT goals: Progressing toward goals    Frequency     Min 4X/week      PT Plan Current plan remains appropriate       AM-PAC PT "6 Clicks" Mobility   Outcome Measure  Help needed turning from your back to your side while in a flat bed without using bedrails?: A Lot Help needed moving from lying on your back to sitting on the side of a flat bed without using bedrails?: Total Help needed moving to and from a bed to a chair (including a wheelchair)?: Total Help needed standing up from a chair using your arms (e.g., wheelchair or bedside chair)?: Total Help needed to walk in hospital room?: Total Help needed climbing 3-5 steps with a railing? : Total 6 Click Score: 7    End of Session Equipment Utilized During Treatment: Oxygen Activity Tolerance: Patient limited by fatigue Patient left: in bed;with call bell/phone within reach;with bed alarm set Nurse Communication: Mobility status;Other (comment)(IV leaking so did not progress to OOB) PT Visit Diagnosis: Unsteadiness on feet (R26.81);Other abnormalities of gait and mobility (R26.89);Difficulty in walking, not elsewhere classified (R26.2)     Time: [id number] PT Time Calculation (min) (ACUTE ONLY): 30 min  Charges:  $Therapeutic Activity: 23-37 mins                     Gavyn Zoss B. Migdalia Dk PT, DPT Acute Rehabilitation Services Pager [phone] Office [phone]    [place] 04/19/2019, 4:49 PM

## 2019-04-19 NOTE — NC FL2 (Signed)
[place] MEDICAID FL2 LEVEL OF CARE SCREENING TOOL     IDENTIFICATION  Patient Name: Henry [name] Birthdate: December 18, 1940 Sex: male Admission Date (Current Location): 04/17/2019  [facility] and [place] Number:  Herbalist and Address:  [facility], [place] [address] [place], [place] [address]      Provider Number: [id number]  Attending Physician Name and Address:  Jonetta Osgood, MD  Relative Name and Phone Number:  [name], Daughter, [phone]    Current Level of Care: Hospital Recommended Level of Care: [place] Prior Approval Number:    Date Approved/Denied:   PASRR Number: [id number]  Discharge Plan: SNF    Current Diagnoses: Patient Active Problem List   Diagnosis Date Noted  . Stroke (cerebrum) ([place]) 04/17/2019  . 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 Acute respiratory failure ([place])   . COVID-19   . 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 Chest wall pain 07/04/2016  . 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 BPH (benign prostatic hyperplasia) 12/01/2015  . Heel pain 05/13/2015  . Inguinal hernia 03/19/2014  . Left hip pain 01/10/2014  . HTN (hypertension) 01/10/2014  . Well adult exam 09/12/2011  . Personal history of colonic polyps 06/07/2010  . GERD 03/16/2010  . COUGH 08/11/2009  . POLYCYTHEMIA 07/01/2009  . Osteoarthritis 04/17/2007  . LOW BACK PAIN 04/17/2007  . LEG PAIN 04/17/2007  . PARESTHESIA 04/17/2007    Orientation RESPIRATION BLADDER Height & Weight     Self, Place  O2(Nasal Cannula 2L) Continent, External catheter Weight: 176 lb 9.4 oz (80.1 kg) Height:     BEHAVIORAL SYMPTOMS/MOOD NEUROLOGICAL BOWEL NUTRITION STATUS  Other (Comment)(none)   Continent Diet(Fluid consistency: Nectar Thick)  AMBULATORY STATUS COMMUNICATION OF NEEDS Skin   Extensive Assist Verbally Normal, Other (Comment)(see dc summary)                       Personal  Care Assistance Level of Assistance  Bathing, Feeding, Dressing, Total care Bathing Assistance: Limited assistance Feeding assistance: Maximum assistance Dressing Assistance: Maximum assistance Total Care Assistance: Limited assistance   Functional Limitations Info  Sight, Hearing, Speech Sight Info: Adequate Hearing Info: Adequate Speech Info: Impaired    SPECIAL CARE FACTORS FREQUENCY  PT (By licensed PT), OT (By licensed OT), Speech therapy     PT Frequency: 4x OT Frequency: 3x     Speech Therapy Frequency: 2x      Contractures Contractures Info: Not present    Additional Factors Info  Code Status, Allergies, Isolation Precautions, Insulin Sliding Scale, Psychotropic Code Status Info: FULL Allergies Info: NKA Psychotropic Info: fentaNYL Insulin Sliding Scale Info: see dc summary Isolation Precautions Info: COVID +     Current Medications (04/19/2019):  This is the current hospital active medication list Current Facility-Administered Medications  Medication Dose Route Frequency Provider Last Rate Last Admin  .  stroke: mapping our early stages of recovery book   Does not apply Once Marliss Coots, PA-C      . 0.9 %  sodium chloride infusion   Intravenous Continuous Erick Colace, NP 50 mL/hr at 04/19/19 0730 New Bag at 04/19/19 0730  . acetaminophen (TYLENOL) 160 MG/5ML solution 650 mg  650 mg Per Tube Q4H PRN Marliss Coots, PA-C      . acetaminophen (TYLENOL) 160 MG/5ML solution 650 mg  650 mg Per Tube Q4H PRN Luanne Bras, MD   650 mg at 04/19/19 1319  .  ascorbic acid (VITAMIN C) tablet 500 mg  500 mg Per Tube Daily Erick Colace, NP   500 mg at 04/19/19 0932  . chlorhexidine gluconate (MEDLINE KIT) (PERIDEX) 0.12 % solution 15 mL  15 mL Mouth Rinse BID Erick Colace, NP   15 mL at 04/19/19 0933  . Chlorhexidine Gluconate Cloth 2 % PADS 6 each  6 each Topical Daily Aroor, Lanice Schwab, MD   6 each at 04/18/19 1100  . dexamethasone (DECADRON) injection 6 mg   6 mg Intravenous Q24H Erick Colace, NP   6 mg at 04/18/19 1624  . folic acid injection 1 mg  1 mg Intravenous Daily Erick Colace, NP   1 mg at 04/19/19 0934  . heparin ADULT infusion 100 units/mL (25000 units/275m sodium chloride 0.45%)  1,600 Units/hr Intravenous Continuous PJoselyn GlassmanA, RPH 16 mL/hr at 04/19/19 1344 1,600 Units/hr at 04/19/19 1344  . hydrALAZINE (APRESOLINE) injection 5-20 mg  5-20 mg Intravenous Q4H PRN XRosalin Hawking MD      . insulin aspart (novoLOG) injection 0-9 Units  0-9 Units Subcutaneous TID WC Ghimire, Shanker M, MD      . iohexol (OMNIPAQUE) 300 MG/ML solution 50 mL  50 mL Intra-arterial Once PRN Deveshwar, SWillaim Rayas MD      . MEDLINE mouth rinse  15 mL Mouth Rinse 10 times per day BErick Colace NP   15 mL at 04/19/19 1344  . remdesivir 100 mg in sodium chloride 0.9 % 100 mL IVPB  100 mg Intravenous Daily BErick Colace NP 200 mL/hr at 04/19/19 0931 100 mg at 04/19/19 0931  . Resource ThickenUp Clear   Oral PRN XRosalin Hawking MD      . senna-docusate (Senokot-S) tablet 1 tablet  1 tablet Oral QHS PRN SMarliss Coots PA-C      . thiamine (B-1) injection 100 mg  100 mg Intravenous Daily BErick Colace NP   100 mg at 04/19/19 0932  . zinc sulfate capsule 220 mg  220 mg Per Tube Daily BErick Colace NP   220 mg at 04/19/19 01610    Discharge Medications: Please see discharge summary for a list of discharge medications.  Relevant Imaging Results:  Relevant Lab Results:   Additional Information SSN: [id number] ARalph Dowdy Student-Social Work

## 2019-04-19 NOTE — Progress Notes (Signed)
ANTICOAGULATION CONSULT NOTE   Pharmacy Consult for Heparin Indication: 2/11 LLE DVT and acute stroke   Patient Measurements: Weight: 176 lb 9.4 oz (80.1 kg) Heparin Dosing Weight: 80kg  Vital Signs: Temp: 98.9 F (37.2 C) (02/12 0010) Temp Source: Oral (02/12 0010) BP: 111/60 (02/12 0010) Pulse Rate: 60 (02/12 0010)  Labs: Recent Labs    04/17/19 1002 04/17/19 1002 04/17/19 1012 04/17/19 1012 04/17/19 1557 04/17/19 1557 04/17/19 1630 04/18/19 0822 04/19/19 0210 04/19/19 0215  HGB 12.7*   < > 12.6*   < > 10.9*   < >  --  11.5*  --  11.3*  HCT 37.8*   < > 37.0*   < > 32.0*  --   --  33.9*  --  33.2*  PLT 130*  --   --   --   --   --   --  134*  --  124*  APTT  --   --   --   --   --   --  30  --   --   --   LABPROT  --   --   --   --   --   --  15.6*  --   --   --   INR  --   --   --   --   --   --  1.2  --   --   --   HEPARINUNFRC  --   --   --   --   --   --   --   --  <0.10*  --   CREATININE 0.95   < > 0.70  --   --   --   --  0.79  --  0.87   < > = values in this interval not displayed.    Estimated Creatinine Clearance: 68.8 mL/min (by C-G formula based on SCr of 0.87 mg/dL).  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 Pharmacy consulted for heparin, low goal, no bolus. H/H and plt stable.  2/12 AM update:  Heparin level undetectable this AM  Goal of Therapy:  Heparin level 0.3-0.5 units/ml Monitor platelets by anticoagulation protocol: Yes   Plan: No bolus  Inc heparin drip to 1500 units/hr Re-check heparin level at 1200 Monitor for bleeding   Narda Bonds, PharmD, [place] Pharmacist Phone: [phone]

## 2019-04-20 LAB — GLUCOSE, CAPILLARY
Glucose-Capillary: 121 mg/dL — ABNORMAL HIGH (ref 70–99)
Glucose-Capillary: 117 mg/dL — ABNORMAL HIGH (ref 70–99)
Glucose-Capillary: 131 mg/dL — ABNORMAL HIGH (ref 70–99)
Glucose-Capillary: 143 mg/dL — ABNORMAL HIGH (ref 70–99)
Glucose-Capillary: 180 mg/dL — ABNORMAL HIGH (ref 70–99)
Glucose-Capillary: 176 mg/dL — ABNORMAL HIGH (ref 70–99)

## 2019-04-20 LAB — CBC
HCT: 32.9 % — ABNORMAL LOW (ref 39.0–52.0)
Hemoglobin: 11.5 g/dL — ABNORMAL LOW (ref 13.0–17.0)
MCH: 37.6 pg — ABNORMAL HIGH (ref 26.0–34.0)
MCHC: 35 g/dL (ref 30.0–36.0)
MCV: 107.5 fL — ABNORMAL HIGH (ref 80.0–100.0)
Platelets: 139 10*3/uL — ABNORMAL LOW (ref 150–400)
RBC: 3.06 MIL/uL — ABNORMAL LOW (ref 4.22–5.81)
RDW: 13.6 % (ref 11.5–15.5)
WBC: 10.2 10*3/uL (ref 4.0–10.5)
nRBC: 0 % (ref 0.0–0.2)

## 2019-04-20 LAB — COMPREHENSIVE METABOLIC PANEL
ALT: 41 U/L (ref 0–44)
AST: 42 U/L — ABNORMAL HIGH (ref 15–41)
Albumin: 2.1 g/dL — ABNORMAL LOW (ref 3.5–5.0)
Alkaline Phosphatase: 80 U/L (ref 38–126)
Anion gap: 7 (ref 5–15)
BUN: 18 mg/dL (ref 8–23)
CO2: 24 mmol/L (ref 22–32)
Calcium: 8.3 mg/dL — ABNORMAL LOW (ref 8.9–10.3)
Chloride: 106 mmol/L (ref 98–111)
Creatinine, Ser: 0.72 mg/dL (ref 0.61–1.24)
GFR calc Af Amer: >60 mL/min (ref >60)
GFR calc non Af Amer: >60 mL/min (ref >60)
Glucose, Bld: 116 mg/dL — ABNORMAL HIGH (ref 70–99)
Potassium: 4 mmol/L (ref 3.5–5.1)
Sodium: 137 mmol/L (ref 135–145)
Total Bilirubin: 1.3 mg/dL — ABNORMAL HIGH (ref 0.3–1.2)
Total Protein: 5.7 g/dL — ABNORMAL LOW (ref 6.5–8.1)

## 2019-04-20 LAB — C-REACTIVE PROTEIN: CRP: 18.9 mg/dL — ABNORMAL HIGH

## 2019-04-20 LAB — CULTURE, RESPIRATORY W GRAM STAIN: Gram Stain: NONE SEEN

## 2019-04-20 LAB — LACTIC ACID, PLASMA: Lactic Acid, Venous: 1.1 mmol/L (ref 0.5–1.9)

## 2019-04-20 LAB — PHOSPHORUS: Phosphorus: 3.3 mg/dL (ref 2.5–4.6)

## 2019-04-20 LAB — TRIGLYCERIDES: Triglycerides: 70 mg/dL (ref <150)

## 2019-04-20 LAB — HEPARIN LEVEL (UNFRACTIONATED)
Heparin Unfractionated: 0.5 IU/mL (ref 0.30–0.70)
Heparin Unfractionated: 0.39 IU/mL (ref 0.30–0.70)

## 2019-04-20 LAB — MAGNESIUM: Magnesium: 1.9 mg/dL (ref 1.7–2.4)

## 2019-04-20 LAB — D-DIMER, QUANTITATIVE: D-Dimer, Quant: 5.47 ug/mL-FEU — ABNORMAL HIGH (ref 0.00–0.50)

## 2019-04-20 LAB — MRSA PCR SCREENING: MRSA by PCR: NEGATIVE

## 2019-04-20 LAB — FERRITIN: Ferritin: 184 ng/mL (ref 24–336)

## 2019-04-20 MED ORDER — CEFAZOLIN SODIUM-DEXTROSE 1-4 GM/50ML-% IV SOLN
1.0000 g | Freq: Three times a day (TID) | INTRAVENOUS | Status: DC
  Administered 2019-04-20 – 2019-04-23 (×9): 1 g via INTRAVENOUS
  Filled 2019-04-20 (×13): qty 50

## 2019-04-20 NOTE — Progress Notes (Signed)
Pharmacy Antibiotic Note  Henry [name] is a 79 y.o. male admitted on 04/17/2019 with acute CVA and COVID-19 PNA. Pharmacy has been consulted for cefazolin dosing with MSSA PNA. Cr stable and wnl.  Plan: -Cefazolin 1g IV q8h -Watch Cr, LOT  Weight: 176 lb 9.4 oz (80.1 kg)  Temp (24hrs), Avg:98.3 F (36.8 C), Min:97.8 F (36.6 C), Max:99.3 F (37.4 C)  Recent Labs  Lab 04/16/19 1358 04/16/19 1358 04/16/19 1440 04/16/19 1659 04/17/19 1002 04/17/19 1012 04/18/19 0822 04/19/19 0215 04/20/19 0826  WBC 5.3  --   --   --  5.1  --  6.4 7.8 10.2  CREATININE 0.94   < >  --   --  0.95 0.70 0.79 0.87 0.72  LATICACIDVEN  --   --  1.6 1.5  --   --   --   --  1.1   < > = values in this interval not displayed.    Estimated Creatinine Clearance: 74.8 mL/min (by C-G formula based on SCr of 0.72 mg/dL).    Allergies  Allergen Reactions  . No Known Allergies     Antimicrobials this admission: Cefazolin 2/13 >>    Thank you for allowing pharmacy to be a part of this patient's care.  Arrie Senate, PharmD, BCPS Clinical Pharmacist [phone] Please check AMION for all [place] numbers 04/20/2019

## 2019-04-20 NOTE — Progress Notes (Signed)
STROKE TEAM PROGRESS NOTE   INTERVAL HISTORY Pt lethargic, lying in bed, however, following simple commands. MRI showed unchanged right MCA large infarct, however with 46mm MLS with petechial hemorrhage but on heparin due to COVID and increase D-dimers. s/p IR w/ TICI2c revascularization of R M1 occlusion. MRA showed right M1 patent now.  Prognosis guarded.    Vitals:   04/20/19 0000 04/20/19 0340 04/20/19 0400 04/20/19 0800  BP: (!) 103/51 108/72 (!) 109/59 109/61  Pulse: (!) 52  (!) 54 (!) 50  Resp: (!) 24  (!) 27 (!) 25  Temp:  97.8 F (36.6 C)  98.3 F (36.8 C)  TempSrc:  Axillary  Oral  SpO2: 95%  96% 94%  Weight:        CBC:  Recent Labs  Lab 04/17/19 1002 04/17/19 1012 04/18/19 0822 04/18/19 0822 04/19/19 0215 04/20/19 0826  WBC 5.1   < > 6.4   < > 7.8 10.2  NEUTROABS 3.5  --  4.7  --   --   --   HGB 12.7*   < > 11.5*   < > 11.3* 11.5*  HCT 37.8*   < > 33.9*   < > 33.2* 32.9*  MCV 109.6*   < > 108.0*   < > 109.9* 107.5*  PLT 130*   < > 134*   < > 124* 139*   < > = values in this interval not displayed.    Basic Metabolic Panel:  Recent Labs  Lab 04/18/19 0822 04/19/19 0215  NA 140 139  K 4.1 4.1  CL 108 108  CO2 22 24  GLUCOSE 110* 125*  BUN 15 20  CREATININE 0.79 0.87  CALCIUM 8.1* 8.4*  MG  --  2.0  PHOS  --  3.8   Lipid Panel:     Component Value Date/Time   CHOL 93 04/18/2019 0822   TRIG 84 04/19/2019 0215   HDL 19 (L) 04/18/2019 0822   CHOLHDL 4.9 04/18/2019 0822   VLDL 21 04/18/2019 0822   LDLCALC 53 04/18/2019 0822   HgbA1c:  Lab Results  Component Value Date   HGBA1C 6.8 (H) 04/17/2019    IMAGING past 48 hours MR ANGIO HEAD WO CONTRAST  Result Date: 04/19/2019 CLINICAL DATA:  Follow-up right MCA occlusion with intervention. EXAM: MRI HEAD WITHOUT CONTRAST MRA HEAD WITHOUT CONTRAST TECHNIQUE: Multiplanar, multiecho pulse sequences of the brain and surrounding structures were obtained without intravenous contrast. Angiographic images  of the head were obtained using MRA technique without contrast. 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 Differences in diffusion appearance is attributed to artifact from petechial hemorrhage. There is progressive swelling with partial effacement of the right lateral ventricle. Midline shift only measures 2 mm. No entrapment. Confluent petechial hemorrhage is seen in the infarcted area. There are a few punctate acute infarcts in the left occipital parietal region, also seen prior. No extra-axial collection. Vascular: Arterial findings below. Skull and upper cervical spine: Negative Sinuses/Orbits: Negative MRA HEAD FINDINGS Motion degraded. Symmetric carotid and vertebral arteries. Dominant left PICA and right AICA. The vertebral, carotid, and basilar arteries are smooth and widely patent. No branch occlusion or aneurysm. Accounting for motion there is symmetric flow in the right MCA. IMPRESSION: 1. 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 There is progressive  swelling with up to 2 mm of midline shift. 2. Redemonstrated minimal acute infarct in left hemispheric white matter. 3. Negative motion degraded MRA.  The right MCA is patent. Electronically Signed   By: Monte Fantasia M.D.   On: 04/19/2019 11:50   MR BRAIN WO CONTRAST  Result Date: 04/19/2019 CLINICAL DATA:  Follow-up right MCA occlusion with intervention. EXAM: MRI HEAD WITHOUT CONTRAST MRA HEAD WITHOUT CONTRAST TECHNIQUE: Multiplanar, multiecho pulse sequences of the brain and surrounding structures were obtained without intravenous contrast. Angiographic images of the head were obtained using MRA technique without contrast. COMPARISON:  Brain MRI from 2 days ago FINDINGS: MRI HEAD FINDINGS Brain: Large acute infarct involving  the majority of the right MCA territory, most confluent in the inferior division and at the striatum. Differences in diffusion appearance is attributed to artifact from petechial hemorrhage. There is progressive swelling with partial effacement of the right lateral ventricle. Midline shift only measures 2 mm. No entrapment. Confluent petechial hemorrhage is seen in the infarcted area. There are a few punctate acute infarcts in the left occipital parietal region, also seen prior. No extra-axial collection. Vascular: Arterial findings below. Skull and upper cervical spine: Negative Sinuses/Orbits: Negative MRA HEAD FINDINGS Motion degraded. Symmetric carotid and vertebral arteries. Dominant left PICA and right AICA. The vertebral, carotid, and basilar arteries are smooth and widely patent. No branch occlusion or aneurysm. Accounting for motion there is symmetric flow in the right MCA. IMPRESSION: 1. 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 There is progressive swelling with up to 2 mm of midline shift. 2. Redemonstrated minimal acute infarct in left hemispheric white matter. 3. Negative motion degraded MRA.  The right MCA is patent. Electronically Signed   By: Monte Fantasia M.D.   On: 04/19/2019 11:50   VAS [place] LOWER EXTREMITY VENOUS (DVT)  Result Date: 04/18/2019  Lower Venous DVTStudy Indications: Elevated Ddimer.  Risk Factors: COVID 19 positive. Limitations: Bandages, open wound and patient positioning. Comparison Study: No prior studies. Performing Technologist: Oliver Hum RVT  Examination Guidelines: A complete evaluation includes B-mode imaging, spectral Doppler, color Doppler, and power Doppler as needed of all accessible portions of each vessel. Bilateral testing is considered an integral part of a complete examination. Limited examinations for reoccurring indications may be performed as noted. The reflux portion of the exam is performed  with the patient in reverse Trendelenburg.  +---------+---------------+---------+-----------+----------+--------------+ RIGHT    CompressibilityPhasicitySpontaneityPropertiesThrombus Aging +---------+---------------+---------+-----------+----------+--------------+ CFV                                                   Not visualized +---------+---------------+---------+-----------+----------+--------------+ SFJ                                                   Not visualized +---------+---------------+---------+-----------+----------+--------------+ FV Prox  Full           Yes      Yes                                 +---------+---------------+---------+-----------+----------+--------------+ FV Mid   Full                                                        +---------+---------------+---------+-----------+----------+--------------+  FV DistalFull                                                        +---------+---------------+---------+-----------+----------+--------------+ PFV      Full                                                        +---------+---------------+---------+-----------+----------+--------------+ POP      Full           Yes      Yes                                 +---------+---------------+---------+-----------+----------+--------------+ PTV      Full                                                        +---------+---------------+---------+-----------+----------+--------------+ PERO     Full                                                        +---------+---------------+---------+-----------+----------+--------------+ Unable to visualize the CFV, SFJ due to bandages.  +---------+---------------+---------+-----------+----------+--------------+ LEFT     CompressibilityPhasicitySpontaneityPropertiesThrombus Aging +---------+---------------+---------+-----------+----------+--------------+ CFV      Full           Yes       Yes                                 +---------+---------------+---------+-----------+----------+--------------+ SFJ      Full                                                        +---------+---------------+---------+-----------+----------+--------------+ FV Prox  Full                                                        +---------+---------------+---------+-----------+----------+--------------+ FV Mid   Full                                                        +---------+---------------+---------+-----------+----------+--------------+ FV DistalFull                                                        +---------+---------------+---------+-----------+----------+--------------+  PFV      Full                                                        +---------+---------------+---------+-----------+----------+--------------+ POP      Full           Yes      Yes                                 +---------+---------------+---------+-----------+----------+--------------+ PTV      Full                                                        +---------+---------------+---------+-----------+----------+--------------+ PERO     Full                                                        +---------+---------------+---------+-----------+----------+--------------+ Gastroc  None                                         Acute          +---------+---------------+---------+-----------+----------+--------------+     Summary: RIGHT: - There is no evidence of deep vein thrombosis in the lower extremity.  - No cystic structure found in the popliteal fossa.  LEFT: - Findings consistent with acute deep vein thrombosis involving the left gastrocnemius veins. - No cystic structure found in the popliteal fossa.  *See table(s) above for measurements and observations. Electronically signed by Servando Snare MD on 04/18/2019 at 6:33:22 PM.    Final    Cerebral  Angio 04/17/2019 S/P RT common carotid arteriogram  Followed by near complete revascularization of occcluded RT MCA M1 seg with x 1 pass with 83mmx 40 mm solitaire X  retriever and x 1 pass with 50mm x 32 mm Trevoprovue retriever and penumbra aspiration achieving a TICI 2C revascularization.[name]  PHYSICAL EXAM   Temp:  [97.8 F (36.6 C)-101.5 F (38.6 C)] 98.3 F (36.8 C) (02/13 0800) Pulse Rate:  [50-63] 50 (02/13 0800) Resp:  [22-29] 25 (02/13 0800) BP: (103-122)/(51-77) 109/61 (02/13 0800) SpO2:  [91 %-96 %] 94 % (02/13 0800)  General - Well nourished, well developed, in no apparent distress but lethargic.  Ophthalmologic - fundi not visualized due to noncooperation.  Cardiovascular - Regular rhythm and rate.  Mental Status -  Level of arousal and orientation to time, place, and person were intact. Language including expression, naming, repetition, comprehension was assessed and found intact. Mild dysarthria  Cranial Nerves II - XII - II - left visual neglect. III, IV, VI - right gaze preference and not able to cross midline. V - Facial sensation showed left sensation neglect. VII - left facial droop. VIII - Hearing & vestibular intact bilaterally. X - Palate elevates symmetrically. XI - Shoulder shrug decreased on the left. XII - Tongue protrusion intact.  Motor Strength - The patient's strength was good RUE and RLE - follows commands well, however, left UE 2/5 proximal and 0/5 distal, left LE 2+/5 proximal and 4/5 distal.  Bulk was normal and fasciculations were absent.   Motor Tone - Muscle tone was assessed at the neck and appendages and was normal.  Reflexes - The patient's reflexes were symmetrical in all extremities and he had no pathological reflexes.  Sensory - Light touch, temperature/pinprick were assessed and diminished on the left with left sensory neglect.    Coordination - The patient had normal movements in the right hand with no ataxia or dysmetria.  Tremor was  absent.  Gait and Station - deferred.   ASSESSMENT/PLAN Mr. Henry [name] is a 79 y.o. male with history of mild hypertension, low back pain, polycythemia, flu sx x 1 week presenting to ED earlier in the day with  generalized weakness, cough and fever, dx with COVID and left AMA. Later developed left-sided weakness, neglect and R gaze preference. EMS returned him to the ED.   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 Hypercoagulable from COVID-19 infection vs cardioembolic source  CT head R MCA infarction w/ subtle petechial hemorrhage R frontal lobe. hyperdense R M1. 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 Proximal L ICA 30-40% stenosis. R VA w/ moderate narrowing at origin. L V1 moderate origin stenosis. Lung apices suspicious for interstitial lung dz, nodular opacity R lung apex.  CT perfusion R MCA 61mL core infarct. 130mL penumbra w/ 113mL mismatch.   Limited MRI  Extensive R MCA infarct. Punctate L parietal and occipital lobe infarcts.  Cerebral angio R M1 occlusion w/ near complete revascularization TICI2c  Repeat CT head 2/10 2035 R MCA cortical, subcortical and BG infarcts with likely contrast staining instead of hemorrhage. No mass effect or shift.   Repeat MRI 2/12 unchanged large R MCA infarct w/ petechial hemorrhage. Progressive edema w/ 41mm midline shift. Small L brain infarct.   Repeat MRA 2/12 R MCA remains patent.   LE venous doppler DVT L gastrocnemius vein  2D Echo EF 55-60%. No source of embolus   Transcranial doppler w/ bubble pending   LDL 53  HgbA1c 6.8  Lovenox 40 mg sq daily for VTE prophylaxis  No antithrombotic prior to admission, now on IV heparin given acute DVT in the setting of high D-dimer and COVID infection  Therapy recommendations:  CIR-> SNF (needs to be > 20 days since positive COVID for xfer to  CIR)  Disposition:  pending   Cerebral edema  MRI showed right to left MLS 34mm  Mild lethargy today  Close neuro monitoring  Consider repeat CT if neuro change   Acute Respiratory Failure COVID-19 PNA  intubated for IR, left intubated after IR d/t COVID  Remdesivir D4/5  Decadron D4/10  Extubate 2/10  D-Dimer 13.53-> >20 ->18.73  CRP 13.5->10->9.7    DVT, distal  LE venous doppler DVT L gastrocnemius vein  On IV heparin given high D-Dimer and COVID infection  Hypertension  Home meds:  None listed . Long-term BP goal normotensive  Dysphagia . Secondary to stroke . Cleared for Dys 1 nectar thick liquids  . 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 This patient's care requiresreview of multiple databases, neurological assessment, discussion with family, other specialists and medical decision making of high complexity.      To contact Stroke Continuity provider, please refer to [email or web address] After hours, contact General Neurology

## 2019-04-20 NOTE — TOC Progression Note (Signed)
Transition of Care [facility]) - Progression Note    Patient Details  Name: Henry [name] MRN: [id number] Date of Birth: Jul 07, 1940  [facility] of [facility] CM/SW [place], LCSW Phone Number: 04/20/2019, 12:38 PM  Clinical Narrative:    CSW provided SNF bed offers to patient's daughter. She will let CSW know choice and requested a status updated from the RN. CSW sent request to RN.    Expected Discharge Plan: [place] Barriers to Discharge: Continued Medical Work up  Expected Discharge Plan and Services Expected Discharge Plan: [place] In-house Referral: Clinical Social Work Discharge Planning Services: CM Consult Post Acute Care Choice: [place] arrangements for the past 2 months: Single Family Home                 DME Arranged: N/A DME Agency: NA                   Social Determinants of Health (SDOH) Interventions    Readmission Risk Interventions No flowsheet data found.

## 2019-04-20 NOTE — Progress Notes (Signed)
Patient tolerated being oob to chair for five hours.

## 2019-04-20 NOTE — Progress Notes (Signed)
   04/20/19 1344  Family/Significant Other Communication  Family/Significant Other Update Called;Updated (pt's daughter Abigail Butts)

## 2019-04-20 NOTE — Progress Notes (Signed)
ANTICOAGULATION CONSULT NOTE   Pharmacy Consult for Heparin Indication: 2/11 LLE DVT and acute stroke   Patient Measurements: Weight: 176 lb 9.4 oz (80.1 kg) Heparin Dosing Weight: 80kg  Vital Signs: Temp: 98.8 F (37.1 C) (02/13 1643) Temp Source: Oral (02/13 1643) BP: 114/63 (02/13 1643) Pulse Rate: 70 (02/13 1643)  Labs: Recent Labs    04/18/19 [id number] 04/19/19 0210 04/19/19 0215 04/19/19 1202 04/19/19 2249 04/20/19 0826 04/20/19 1815  HGB 11.5*  --  11.3*  --   --  11.5*  --   HCT 33.9*  --  33.2*  --   --  32.9*  --   PLT 134*  --  124*  --   --  139*  --   HEPARINUNFRC  --    < >  --    < > 0.70 0.50 0.39  CREATININE 0.79  --  0.87  --   --  0.72  --    < > = values in this interval not displayed.    Estimated Creatinine Clearance: 74.8 mL/min (by C-G formula based on SCr of 0.72 mg/dL).  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 Pharmacy consulted for heparin for DVT. Will target lower goal range of 0.3-0.5 due to CVA.   Repeat heparin level remains within therapeutic range at 0.39.   Goal of Therapy:  Heparin level 0.3-0.5 units/ml (due to CVA)  Monitor platelets by anticoagulation protocol: Yes   Plan: -Continue heparin 1400 units/h -Daily heparin level and CBC   Arrie Senate, PharmD, BCPS Clinical Pharmacist [phone] Please check AMION for all [place] numbers 04/20/2019

## 2019-04-20 NOTE — Progress Notes (Signed)
Per day shift RN, Abigail Butts (patients daughter) would like to be contacted via [facility] tomorrow morning. Will relay information to oncoming dayshift RN.

## 2019-04-20 NOTE — Progress Notes (Signed)
Patient's daughter and family updated via facetime.  Hiram Comber, RN 04/20/2019 6:00 PM

## 2019-04-20 NOTE — Progress Notes (Addendum)
ANTICOAGULATION CONSULT NOTE   Pharmacy Consult for Heparin Indication: 2/11 LLE DVT and acute stroke   Patient Measurements: Weight: 176 lb 9.4 oz (80.1 kg) Heparin Dosing Weight: 80kg  Vital Signs: Temp: 97.8 F (36.6 C) (02/13 0340) Temp Source: Axillary (02/13 0340) BP: 109/59 (02/13 0400) Pulse Rate: 54 (02/13 0400)  Labs: Recent Labs    04/17/19 1002 04/17/19 1002 04/17/19 1012 04/17/19 1012 04/17/19 1557 04/17/19 1557 04/17/19 1630 04/18/19 0822 04/19/19 0210 04/19/19 0215 04/19/19 1202 04/19/19 2249  HGB 12.7*   < > 12.6*   < > 10.9*   < >  --  11.5*  --  11.3*  --   --   HCT 37.8*   < > 37.0*   < > 32.0*  --   --  33.9*  --  33.2*  --   --   PLT 130*  --   --   --   --   --   --  134*  --  124*  --   --   APTT  --   --   --   --   --   --  30  --   --   --   --   --   LABPROT  --   --   --   --   --   --  15.6*  --   --   --   --   --   INR  --   --   --   --   --   --  1.2  --   --   --   --   --   HEPARINUNFRC  --   --   --   --   --   --   --   --  <0.10*  --  0.23* 0.70  CREATININE 0.95   < > 0.70  --   --   --   --  0.79  --  0.87  --   --    < > = values in this interval not displayed.    Estimated Creatinine Clearance: 68.8 mL/min (by C-G formula based on SCr of 0.87 mg/dL).  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 Pharmacy consulted for heparin for DVT. Will target lower goal range of 0.3-0.5 due to CVA.   Heparin level 0.50 on heparin 1450 units/hr is at goal though at the upper end of the targeted goal range (0.3 to 0.5). Hgb 11.5 and stable. Plt 139 which is stable for the patient. No reported bleeding. Confirmed current rate with RN.    Goal of Therapy:  Heparin level 0.3-0.5 units/ml (due to CVA)  Monitor platelets by anticoagulation protocol: Yes   Plan: Decrease heparin slightly to 1400 units/hr to ensure remains in targeted range Check heparin level  at 1800  Monitor heparin level, CBC, and S/S of bleeding  Follow up transition to oral anticoagulant   Cristela Felt, PharmD PGY1 Pharmacy Resident [employer]: [phone]

## 2019-04-20 NOTE — Progress Notes (Signed)
PROGRESS NOTE                                                                                                                                                                                                             Patient Demographics:    Henry [name], is a 79 y.o. male, DOB - 05/07/1940, KZS:[id number]  Outpatient Primary MD for the patient is [name], [name], MD   Admit date - 04/17/2019   LOS - 3  No chief complaint on file.      Brief Narrative: Patient is a 79 y.o. male with PMHx of HTN, polycythemia, chronic GERD, chronic low back pain who was admitted on 2/10 following a right MCA stroke-patient was taken by IR for revascularization procedure as patient was outside the TPA window-following which he was admitted to the ICU and required ventilator support.  Patient was also found to have COVID-19.  Upon clinical stability-and extubation-patient was transferred to the [facility] on 2/12.  Significant events: 2/10>> admitted with acute right MCA stroke-to IR for revascularization of Right MCA-return to ICU on a ventilator 2/11>> extubated 2/12>> transfer to [facility], [place]   Subjective:    Henry [name] appears unchanged-he continues to have significant dysarthria and left upper extremity weakness.   Assessment  & Plan :   Acute Hypoxic Resp Failure due to Covid 19 Viral pneumonia: Remains stable-on just 1 L of oxygen-he was extubated on 2/12-CRP has significantly jumped up today-he had a fever yesterday afternoon-he is at high risk for aspiration pneumonia.  Sputum cultures positive on 2/10 for MSSA.  He is also status post extubation on 2/12.  Suspect that he may have superimposed aspiration pneumonia-we will start Ancef.  Continue steroids/remdesivir.  Will repeat chest x-ray/procalcitonin tomorrow morning.  Fever: afebrile this morning-however had fever of 101.5 F yesterday afternoon.  O2  requirements:  SpO2: 94 % O2 Flow Rate (L/min): 1 L/min FiO2 (%): 28 %   COVID-19 Labs: Recent Labs    04/17/19 1630 04/18/19 0822 04/18/19 1642 04/19/19 0215 04/20/19 0826  DDIMER  --  >20.00*  --  18.73* 5.47*  FERRITIN   < >  --  510* 485* 184  CRP   < >  --  10.0* 9.7* 18.9*   < > = values in this interval not displayed.    No results found  for: BNP  Recent Labs  Lab 04/17/19 1630  PROCALCITON 0.10    Lab Results  Component Value Date   SARSCOV2NAA POSITIVE (A) 04/16/2019     COVID-19 Medications: Steroids: 2/10>> Remdesivir: 2/10>>  Antibiotics: Ancef: 2/13>>  Prone/Incentive Spirometry: encouraged incentive spirometry use 3-4/hour.  DVT Prophylaxis  : IV heparin  Left lower extremity DVT: Likely provoked by COVID-19-on IV heparin.  Large right MCA and punctate left parietal/occipital lobe infarct: On IV heparin and aspirin.  Patient is s/p revascularization of right M1 occlusion by interventional radiology.  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  Echo with preserved EF and without any embolic foci.  LDL 53, A1c 6.8.  Has gross dysarthria, left facial droop, and significant weakness in the left upper extremity.  Will await further recommendations from stroke MD.  Dysphagia: Secondary to CVA-followed by SLP-on dysphagia 1 diet.  Maintain aspiration precautions  HTN: BP controlled-not on any antihypertensives.  Allow permissive hypertension.  Consults  :  Neuro/PCCM  Procedures  :   ETT:2/10>>2/11 Revascularization of right proximal MCA 2/10  ABG:    Component Value Date/Time   PHART 7.407 04/17/2019 1557   PCO2ART 43.1 04/17/2019 1557   PO2ART 449.0 (H) 04/17/2019 1557   HCO3 27.1 04/17/2019 1557   TCO2 28 04/17/2019 1557   O2SAT 100.0 04/17/2019 1557    Vent Settings: N/A  Condition - Extremely Guarded  Family Communication  :  Daughter updated over the phone 2/12 and 2/13  Code Status :  Full Code  Diet :  Diet Order            DIET  - DYS 1 Room service appropriate? No; Fluid consistency: Nectar Thick  Diet effective now               Disposition Plan  :  Remain hospitalized-CIR versus SNF on discharge.  Barriers to discharge: Hypoxia requiring O2 supplementation/complete 5 days of IV Remdesivir.work up for CVA in progress  Antimicorbials  :    Anti-infectives (From admission, onward)   Start     Dose/Rate Route Frequency Ordered Stop   04/18/19 1000  remdesivir 100 mg in sodium chloride 0.9 % 100 mL IVPB     100 mg 200 mL/hr over 30 Minutes Intravenous Daily 04/17/19 1457 04/22/19 0959   04/17/19 1630  remdesivir 200 mg in sodium chloride 0.9% 250 mL IVPB     200 mg 580 mL/hr over 30 Minutes Intravenous Once 04/17/19 1457 04/17/19 1908   04/17/19 1128  ceFAZolin (ANCEF) 2-4 GM/100ML-% IVPB    Note to Pharmacy: Fredric Dine   : cabinet override      04/17/19 1128 04/17/19 1543      Inpatient Medications  Scheduled Meds:   stroke: mapping our early stages of recovery book   Does not apply Once   vitamin C  500 mg Per Tube Daily   chlorhexidine gluconate (MEDLINE KIT)  15 mL Mouth Rinse BID   dexamethasone (DECADRON) injection  6 mg Intravenous [id number]   folic acid  1 mg Intravenous Daily   insulin aspart  0-9 Units Subcutaneous TID WC   thiamine injection  100 mg Intravenous Daily   zinc sulfate  220 mg Per Tube Daily   Continuous Infusions:  sodium chloride 50 mL/hr at 04/20/19 0421   heparin 1,400 Units/hr (04/20/19 0950)   remdesivir 100 mg in NS 100 mL 100 mg (04/20/19 0847)   PRN Meds:.[DISCONTINUED] acetaminophen **OR** acetaminophen (TYLENOL) oral liquid 160 mg/5 mL **OR** [DISCONTINUED]  acetaminophen, [DISCONTINUED] acetaminophen **OR** acetaminophen (TYLENOL) oral liquid 160 mg/5 mL **OR** [DISCONTINUED] acetaminophen, hydrALAZINE, iohexol, Resource ThickenUp Clear, senna-docusate   Time Spent in minutes  25  See all Orders from today for further details   Oren Binet M.D  on 04/20/2019 at 3:45 PM  To page go to [email or web address] - use universal password  [facility] -  Office  [phone]    Objective:   Vitals:   04/20/19 0340 04/20/19 0400 04/20/19 0800 04/20/19 1200  BP: 108/72 (!) 109/59 109/61 (!) 102/58  Pulse:  (!) 54 (!) 50 61  Resp:  (!) 27 (!) 25 (!) 27  Temp: 97.8 F (36.6 C)  98.3 F (36.8 C) 97.9 F (36.6 C)  TempSrc: Axillary  Oral Oral  SpO2:  96% 94% 94%  Weight:        Wt Readings from Last 3 Encounters:  04/18/19 80.1 kg  04/16/19 81.6 kg  08/24/18 81.6 kg     Intake/Output Summary (Last 24 hours) at 04/20/2019 1545 Last data filed at 04/20/2019 1520 Gross per 24 hour  Intake 2286.28 ml  Output 2550 ml  Net -263.72 ml     Physical Exam Gen Exam:Alert awake-not in any distress.  Dysarthria appears unchanged. HEENT:atraumatic, normocephalic Chest: B/L clear to auscultation anteriorly CVS:S1S2 regular Abdomen:soft non tender, non distended Extremities:no edema Neurology: Almost 0/5 in the left upper extremity-3/5 in the lower extremity, 5/5 everywhere. Skin: no rash   Data Review:    CBC Recent Labs  Lab 04/16/19 1358 04/16/19 1358 04/17/19 1002 04/17/19 1002 04/17/19 1012 04/17/19 1557 04/18/19 0822 04/19/19 0215 04/20/19 0826  WBC 5.3  --  5.1  --   --   --  6.4 7.8 10.2  HGB 13.8   < > 12.7*   < > 12.6* 10.9* 11.5* 11.3* 11.5*  HCT 40.5   < > 37.8*   < > 37.0* 32.0* 33.9* 33.2* 32.9*  PLT 145*  --  130*  --   --   --  134* 124* 139*  MCV 108.6*  --  109.6*  --   --   --  108.0* 109.9* 107.5*  MCH 37.0*  --  36.8*  --   --   --  36.6* 37.4* 37.6*  MCHC 34.1  --  33.6  --   --   --  33.9 34.0 35.0  RDW 13.7  --  13.7  --   --   --  14.0 13.9 13.6  LYMPHSABS 1.0  --  0.9  --   --   --  1.0  --   --   MONOABS 0.5  --  0.5  --   --   --  0.7  --   --   EOSABS 0.1  --  0.1  --   --   --  0.0  --   --   BASOSABS 0.0  --  0.0  --   --   --  0.0  --   --    < > = values in this interval not  displayed.    Chemistries  Recent Labs  Lab 04/16/19 1358 04/16/19 1358 04/17/19 1002 04/17/19 1002 04/17/19 1012 04/17/19 1557 04/18/19 0822 04/19/19 0215 04/20/19 0826  NA 134*   < > 136   < > 136 137 140 139 137  K 4.5   < > 4.0   < > 3.8 4.1 4.1 4.1 4.0  CL 102   < > 103  --  102  --  108 108 106  CO2 21*  --  21*  --   --   --  _0 GLUCOSE 120*   < > 158*  --  150*  --  110* 125* 116*  BUN 17   < > 18  --  18  --  _1 CREATININE 0.94   < > 0.95  --  0.70  --  0.79 0.87 0.72  CALCIUM 8.7*  --  8.5*  --   --   --  8.1* 8.4* 8.3*  MG  --   --   --   --   --   --   --  2.0 1.9  AST 44*  --  36  --   --   --  32 32 42*  ALT 40  --  33  --   --   --  27 26 41  ALKPHOS 83  --  71  --   --   --  69 70 80  BILITOT 1.1  --  1.0  --   --   --  0.9 0.9 1.3*   < > = values in this interval not displayed.   ------------------------------------------------------------------------------------------------------------------ Recent Labs    04/18/19 0822 04/18/19 0822 04/19/19 0215 04/20/19 0826  CHOL 93  --   --   --   HDL 19*  --   --   --   LDLCALC 53  --   --   --   TRIG 106   < > 84 70  CHOLHDL 4.9  --   --   --    < > = values in this interval not displayed.    Lab Results  Component Value Date   HGBA1C 6.8 (H) 04/17/2019   ------------------------------------------------------------------------------------------------------------------ No results for input(s): TSH, T4TOTAL, T3FREE, THYROIDAB in the last 72 hours.  Invalid input(s): FREET3 ------------------------------------------------------------------------------------------------------------------ Recent Labs    04/19/19 0215 04/20/19 0826  FERRITIN 485* 184    Coagulation profile Recent Labs  Lab 04/17/19 1630  INR 1.2    Recent Labs    04/19/19 0215 04/20/19 0826  DDIMER 18.73* 5.47*    Cardiac Enzymes No results for input(s): CKMB, TROPONINI, MYOGLOBIN in the last 168  hours.  Invalid input(s): CK ------------------------------------------------------------------------------------------------------------------ No results found for: BNP  Micro Results Recent Results (from the past 240 hour(s))  Culture, blood (routine x 2)     Status: None (Preliminary result)   Collection Time: 04/16/19  1:45 PM   Specimen: BLOOD  Result Value Ref Range Status   Specimen Description BLOOD LEFT ANTECUBITAL  Final   Special Requests   Final    BOTTLES DRAWN AEROBIC AND ANAEROBIC Blood Culture results may not be optimal due to an inadequate volume of blood received in culture bottles   Culture   Final    NO GROWTH 4 DAYS Performed at [facility] [address] [place], [place] [address]    Report Status PENDING  Incomplete  Culture, blood (routine x 2)     Status: None (Preliminary result)   Collection Time: 04/16/19  2:49 PM   Specimen: BLOOD RIGHT HAND  Result Value Ref Range Status   Specimen Description BLOOD RIGHT HAND  Final   Special Requests   Final    BOTTLES DRAWN AEROBIC AND ANAEROBIC Blood Culture results may not be optimal due to an inadequate volume of blood received in culture bottles   Culture  Final    NO GROWTH 4 DAYS Performed at [facility], [place] [address] [place], [place] [address]    Report Status PENDING  Incomplete  Urine culture     Status: Abnormal   Collection Time: 04/16/19  4:45 PM   Specimen: Urine, Random  Result Value Ref Range Status   Specimen Description URINE, RANDOM  Final   Special Requests NONE  Final   Culture (A)  Final    <10,000 COLONIES/mL INSIGNIFICANT GROWTH Performed at [facility], [place] [address], [place], [place] [address]    Report Status 04/17/2019 FINAL  Final  Respiratory Panel by RT PCR (Flu A&B, Covid) -     Status: Abnormal   Collection Time: 04/16/19  5:30 PM  Result Value Ref Range Status   SARS Coronavirus 2 by RT PCR POSITIVE (A) NEGATIVE Final    Comment: RESULT CALLED  TO, READ BACK BY AND VERIFIED WITH: Dois Davenport RN [id number] 04/16/19 A BROWNING (NOTE) SARS-CoV-2 target nucleic acids are DETECTED. SARS-CoV-2 RNA is generally detectable in upper respiratory specimens  during the acute phase of infection. Positive results are indicative of the presence of the identified virus, but do not rule out bacterial infection or co-infection with other pathogens not detected by the test. Clinical correlation with patient history and other diagnostic information is necessary to determine patient infection status. The expected result is Negative. Fact Sheet for Patients:  PinkCheek.be Fact Sheet for Healthcare Providers: GravelBags.it This test is not yet approved or cleared by the [place] FDA and  has been authorized for detection and/or diagnosis of SARS-CoV-2 by FDA under an Emergency Use Authorization (EUA).  This EUA will remain in effect (meaning this test can be used) fo r the duration of  the COVID-19 declaration under Section 564(b)(1) of the Act, 21 U.S.C. section 360bbb-3(b)(1), unless the authorization is terminated or revoked sooner.    Influenza A by PCR NEGATIVE NEGATIVE Final   Influenza B by PCR NEGATIVE NEGATIVE Final    Comment: (NOTE) The Xpert Xpress SARS-CoV-2/FLU/RSV assay is intended as an aid in  the diagnosis of influenza from Nasopharyngeal swab specimens and  should not be used as a sole basis for treatment. Nasal washings and  aspirates are unacceptable for Xpert Xpress SARS-CoV-2/FLU/RSV  testing. Fact Sheet for Patients: PinkCheek.be Fact Sheet for Healthcare Providers: GravelBags.it This test is not yet approved or cleared by the [place] FDA and  has been authorized for detection and/or diagnosis of SARS-CoV-2 by  FDA under an Emergency Use Authorization (EUA). This EUA will remain  in effect (meaning this  test can be used) for the duration of the  Covid-19 declaration under Section 564(b)(1) of the Act, 21  U.S.C. section 360bbb-3(b)(1), unless the authorization is  terminated or revoked. Performed at [facility], [place] [address], [place], [place] [address]   Culture, respiratory (non-expectorated)     Status: None   Collection Time: 04/17/19  5:05 PM   Specimen: Tracheal Aspirate; Respiratory  Result Value Ref Range Status   Specimen Description TRACHEAL ASPIRATE  Final   Special Requests   Final    NONE Performed at [facility], [place] [address], [place], [place] [address]    Gram Stain NO WBC SEEN NO ORGANISMS SEEN   Final   Culture RARE STAPHYLOCOCCUS AUREUS  Final   Report Status 04/20/2019 FINAL  Final   Organism ID, Bacteria STAPHYLOCOCCUS AUREUS  Final  Susceptibility   Staphylococcus aureus - MIC*    CIPROFLOXACIN <=0.5 SENSITIVE Sensitive     ERYTHROMYCIN <=0.25 SENSITIVE Sensitive     GENTAMICIN <=0.5 SENSITIVE Sensitive     OXACILLIN 0.5 SENSITIVE Sensitive     TETRACYCLINE <=1 SENSITIVE Sensitive     VANCOMYCIN <=0.5 SENSITIVE Sensitive     TRIMETH/SULFA <=10 SENSITIVE Sensitive     CLINDAMYCIN <=0.25 SENSITIVE Sensitive     RIFAMPIN <=0.5 SENSITIVE Sensitive     Inducible Clindamycin NEGATIVE Sensitive     * RARE STAPHYLOCOCCUS AUREUS  MRSA PCR Screening     Status: None   Collection Time: 04/20/19  9:24 AM   Specimen: Nasal Mucosa; Nasopharyngeal  Result Value Ref Range Status   MRSA by PCR NEGATIVE NEGATIVE Final    Comment:        The GeneXpert MRSA Assay (FDA approved for NASAL specimens only), is one component of a comprehensive MRSA colonization surveillance program. It is not intended to diagnose MRSA infection nor to guide or monitor treatment for MRSA infections. Performed at [facility], [place] [address] [place], [place] [address]     Radiology Reports CT ANGIO HEAD W OR WO CONTRAST  Result Date: 04/17/2019 CLINICAL  DATA:  Stroke, follow-up. EXAM: CT ANGIOGRAPHY HEAD AND NECK CT PERFUSION BRAIN TECHNIQUE: Multidetector CT imaging of the head and neck was performed using the standard protocol during bolus administration of intravenous contrast. Multiplanar CT image reconstructions and MIPs were obtained to evaluate the vascular anatomy. Carotid stenosis measurements (when applicable) are obtained utilizing NASCET criteria, using the distal internal carotid diameter as the denominator. Multiphase CT imaging of the brain was performed following IV bolus contrast injection. Subsequent parametric perfusion maps were calculated using RAPID software. CONTRAST:  188m OMNIPAQUE IOHEXOL 350 MG/ML SOLN COMPARISON:  Noncontrast head CT performed earlier the same day. FINDINGS: CTA NECK FINDINGS Aortic arch: The origins of the innominate and left common carotid arteries are incompletely imaged. The visualized aortic arch is unremarkable. No significant innominate or proximal subclavian stenosis visualized. Right carotid system: CCA patent to the bifurcation without stenosis. Prominent predominantly calcified plaque within the distal common carotid artery and proximal ICA. There is likely greater than 70% stenosis at the origin of the ICA, although exact quantification of stenosis is difficult due to irregularity of calcified plaque. Distal to this the ICA is patent within the neck without stenosis. Left carotid system: CCA patent to the bifurcation without stenosis. Calcified plaque within the distal common carotid artery, carotid bifurcation and proximal ICA. Resultant 30-40% narrowing of the proximal ICA as compared to the more distal vessel. Distal to this the ICA is patent within the neck without stenosis. Vertebral arteries: Right vertebral artery dominant. 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 Calcified plaque at the origin of the medial V1 left vertebral artery branch with at least moderate ostial stenosis. More distally the  bilateral vertebral arteries are patent within the neck without stenosis. Skeleton: No acute bony abnormality. Cervical spondylosis with C5-C6 and C6-C7 posterior disc osteophytes. Other neck: No neck mass or cervical adenopathy. Upper chest: Paraseptal emphysema within the imaged lung apices. Also within the imaged lung apices but there is interstitial prominence suspicious for interstitial lung disease. There are more nodular opacities within the right upper lobe measuring up to 1.4 cm (series 7, image 56) (series 7, image 99). Review of the MIP images confirms the above findings CTA HEAD FINDINGS Anterior circulation: The intracranial internal carotid arteries are patent with scattered  calcified plaque. No more than mild stenosis within these vessels. 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 There is minimal reconstitution of flow seen within M2 and more distal right MCA branch vessels. The left middle cerebral artery is patent without high-grade proximal stenosis. The bilateral anterior cerebral arteries are patent without significant proximal stenosis. Posterior circulation: The intracranial vertebral arteries are patent without significant stenosis, as is the basilar artery. The bilateral posterior cerebral arteries are patent without significant proximal stenosis. A sizable right posterior communicating artery is present. The a left posterior communicating artery is poorly delineated and may be hypoplastic or absent. Venous sinuses: Within limitations of contrast timing, no convincing thrombus. Anatomic variants: As described Review of the MIP images confirms the above findings CT Brain Perfusion Findings: ASPECTS: 4 CBF (<30%) Volume: 64m (in the right MCA vascular territory) Perfusion (Tmax>6.0s) volume: 147 mL (in the right MCA vascular territory) Mismatch Volume: 1315mInfarction Location:Right MCA vascular territory Findings of a proximal M1 right MCA occlusion and the perfusion results  were discussed by telephone at the time of interpretation on 04/17/2019 at 10:38 am to provider Dr. ArLorraine Laxwho verbally acknowledged these results. IMPRESSION: CTA neck: 1. The common and internal carotid arteries are patent within the neck bilaterally. 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 There is likely greater than 70% stenosis at the origin of the right ICA. 30-40% stenosis of the proximal left ICA. 2. The vertebral arteries are patent within the neck bilaterally. The right vertebral artery is dominant with moderate atherosclerotic narrowing at its origin. Duplicated V1 left vertebral artery. There is at least moderate ostial stenosis at the origin of the medial left V1 vertebral artery branch. 3. The partially imaged lung apices demonstrate findings suspicious for interstitial lung disease. Additionally, there are nodular opacities within the right lung apex measuring up to 1.4 cm. Dedicated chest CT is recommended for further evaluation when clinically feasible. CTA head: 1. 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 There is minimal reconstitution of flow within M2 and more distal right MCA branch vessels. 2. No other intracranial large vessel occlusion or proximal high-grade arterial stenosis identified. CT perfusion head: The perfusion software identifies a 10 mL core infarct within the right MCA vascular territory. However, please note more extensive infarction changes were appreciated on concurrently performed noncontrast head CT. The perfusion software detects a 147 mL region of hypoperfused parenchyma within the right MCA vascular territory. Reported mismatch 137 mL. Electronically Signed   By: KyKellie SimmeringO   On: 04/17/2019 11:04   CT HEAD WO CONTRAST  Result Date: 04/17/2019 CLINICAL DATA:  Follow-up intervention for right MCA occlusion. EXAM: CT HEAD WITHOUT CONTRAST TECHNIQUE: Contiguous axial images were obtained from the base of the skull through the  vertex without intravenous contrast. COMPARISON:  Interventional images same day.  Head CT same day. FINDINGS: Brain: In the region the small core infarction in the lateral temporal lobe and the penumbra of the majority of the right middle cerebral artery cortical territory, there is brain swelling with effacement of the sulci and areas of patchy low-density as well as some areas of increased attenuation. Low-density present within the right basal ganglia. The appearance of the increased attenuation is more consistent with post procedural contrast staining than intraparenchymal hemorrhage. Hemorrhage is not excluded but not favored. Certainly, there is no frank hematoma. No evidence of midline shift. No hydrocephalus. No extra-axial collection. Vascular: No acute vascular finding primarily. Skull: Negative  Sinuses/Orbits: Clear except for mild mucosal thickening. Other: None IMPRESSION: Ischemic changes in the right MCA territory affecting the cortical brain, subcortical brain and right basal ganglia. Areas of low-density as well as areas of indistinct higher density favored to represent contrast staining rather than frank hemorrhage. Low level petechial bleeding is not excluded. There is no discrete hematoma. No mass effect or shift, despite mild-to-moderate swelling in the region of affected brain. Electronically Signed   By: Nelson Chimes M.D.   On: 04/17/2019 20:35   CT ANGIO NECK W OR WO CONTRAST  Result Date: 04/17/2019 CLINICAL DATA:  Stroke, follow-up. EXAM: CT ANGIOGRAPHY HEAD AND NECK CT PERFUSION BRAIN TECHNIQUE: Multidetector CT imaging of the head and neck was performed using the standard protocol during bolus administration of intravenous contrast. Multiplanar CT image reconstructions and MIPs were obtained to evaluate the vascular anatomy. Carotid stenosis measurements (when applicable) are obtained utilizing NASCET criteria, using the distal internal carotid diameter as the denominator. Multiphase  CT imaging of the brain was performed following IV bolus contrast injection. Subsequent parametric perfusion maps were calculated using RAPID software. CONTRAST:  160m OMNIPAQUE IOHEXOL 350 MG/ML SOLN COMPARISON:  Noncontrast head CT performed earlier the same day. FINDINGS: CTA NECK FINDINGS Aortic arch: The origins of the innominate and left common carotid arteries are incompletely imaged. The visualized aortic arch is unremarkable. No significant innominate or proximal subclavian stenosis visualized. Right carotid system: CCA patent to the bifurcation without stenosis. Prominent predominantly calcified plaque within the distal common carotid artery and proximal ICA. There is likely greater than 70% stenosis at the origin of the ICA, although exact quantification of stenosis is difficult due to irregularity of calcified plaque. Distal to this the ICA is patent within the neck without stenosis. Left carotid system: CCA patent to the bifurcation without stenosis. Calcified plaque within the distal common carotid artery, carotid bifurcation and proximal ICA. Resultant 30-40% narrowing of the proximal ICA as compared to the more distal vessel. Distal to this the ICA is patent within the neck without stenosis. Vertebral arteries: Right vertebral artery dominant. 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 Calcified plaque at the origin of the medial V1 left vertebral artery branch with at least moderate ostial stenosis. More distally the bilateral vertebral arteries are patent within the neck without stenosis. Skeleton: No acute bony abnormality. Cervical spondylosis with C5-C6 and C6-C7 posterior disc osteophytes. Other neck: No neck mass or cervical adenopathy. Upper chest: Paraseptal emphysema within the imaged lung apices. Also within the imaged lung apices but there is interstitial prominence suspicious for interstitial lung disease. There are more nodular opacities within the right upper lobe measuring up to 1.4  cm (series 7, image 56) (series 7, image 99). Review of the MIP images confirms the above findings CTA HEAD FINDINGS Anterior circulation: The intracranial internal carotid arteries are patent with scattered calcified plaque. No more than mild stenosis within these vessels. 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 There is minimal reconstitution of flow seen within M2 and more distal right MCA branch vessels. The left middle cerebral artery is patent without high-grade proximal stenosis. The bilateral anterior cerebral arteries are patent without significant proximal stenosis. Posterior circulation: The intracranial vertebral arteries are patent without significant stenosis, as is the basilar artery. The bilateral posterior cerebral arteries are patent without significant proximal stenosis. A sizable right posterior communicating artery is present. The a left posterior communicating artery is poorly delineated and may be hypoplastic or absent. Venous sinuses:  Within limitations of contrast timing, no convincing thrombus. Anatomic variants: As described Review of the MIP images confirms the above findings CT Brain Perfusion Findings: ASPECTS: 4 CBF (<30%) Volume: 53m (in the right MCA vascular territory) Perfusion (Tmax>6.0s) volume: 147 mL (in the right MCA vascular territory) Mismatch Volume: 1349mInfarction Location:Right MCA vascular territory Findings of a proximal M1 right MCA occlusion and the perfusion results were discussed by telephone at the time of interpretation on 04/17/2019 at 10:38 am to provider Dr. ArLorraine Laxwho verbally acknowledged these results. IMPRESSION: CTA neck: 1. The common and internal carotid arteries are patent within the neck bilaterally. 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 There is likely greater than 70% stenosis at the origin of the right ICA. 30-40% stenosis of the proximal left ICA. 2. The vertebral arteries are patent within the neck  bilaterally. The right vertebral artery is dominant with moderate atherosclerotic narrowing at its origin. Duplicated V1 left vertebral artery. There is at least moderate ostial stenosis at the origin of the medial left V1 vertebral artery branch. 3. The partially imaged lung apices demonstrate findings suspicious for interstitial lung disease. Additionally, there are nodular opacities within the right lung apex measuring up to 1.4 cm. Dedicated chest CT is recommended for further evaluation when clinically feasible. CTA head: 1. 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 There is minimal reconstitution of flow within M2 and more distal right MCA branch vessels. 2. No other intracranial large vessel occlusion or proximal high-grade arterial stenosis identified. CT perfusion head: The perfusion software identifies a 10 mL core infarct within the right MCA vascular territory. However, please note more extensive infarction changes were appreciated on concurrently performed noncontrast head CT. The perfusion software detects a 147 mL region of hypoperfused parenchyma within the right MCA vascular territory. Reported mismatch 137 mL. Electronically Signed   By: KyKellie SimmeringO   On: 04/17/2019 11:04   MR ANGIO HEAD WO CONTRAST  Result Date: 04/19/2019 CLINICAL DATA:  Follow-up right MCA occlusion with intervention. EXAM: MRI HEAD WITHOUT CONTRAST MRA HEAD WITHOUT CONTRAST TECHNIQUE: Multiplanar, multiecho pulse sequences of the brain and surrounding structures were obtained without intravenous contrast. Angiographic images of the head were obtained using MRA technique without contrast. 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 Differences in diffusion appearance is attributed to artifact from petechial hemorrhage. There is progressive swelling  with partial effacement of the right lateral ventricle. Midline shift only measures 2 mm. No entrapment. Confluent petechial hemorrhage is seen in the infarcted area. There are a few punctate acute infarcts in the left occipital parietal region, also seen prior. No extra-axial collection. Vascular: Arterial findings below. Skull and upper cervical spine: Negative Sinuses/Orbits: Negative MRA HEAD FINDINGS Motion degraded. Symmetric carotid and vertebral arteries. Dominant left PICA and right AICA. The vertebral, carotid, and basilar arteries are smooth and widely patent. No branch occlusion or aneurysm. Accounting for motion there is symmetric flow in the right MCA. IMPRESSION: 1. 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 There is progressive swelling with up to 2 mm of midline shift. 2. Redemonstrated minimal acute infarct in left hemispheric white matter. 3. Negative motion degraded MRA.  The right MCA is patent. Electronically Signed   By: JoMonte Fantasia.D.   On: 04/19/2019  11:50   MR BRAIN WO CONTRAST  Result Date: 04/19/2019 CLINICAL DATA:  Follow-up right MCA occlusion with intervention. EXAM: MRI HEAD WITHOUT CONTRAST MRA HEAD WITHOUT CONTRAST TECHNIQUE: Multiplanar, multiecho pulse sequences of the brain and surrounding structures were obtained without intravenous contrast. Angiographic images of the head were obtained using MRA technique without contrast. 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 Differences in diffusion appearance is attributed to artifact from petechial hemorrhage. There is progressive swelling with partial effacement of the right lateral ventricle. Midline shift only measures 2 mm. No entrapment. Confluent petechial hemorrhage is seen in the infarcted area.  There are a few punctate acute infarcts in the left occipital parietal region, also seen prior. No extra-axial collection. Vascular: Arterial findings below. Skull and upper cervical spine: Negative Sinuses/Orbits: Negative MRA HEAD FINDINGS Motion degraded. Symmetric carotid and vertebral arteries. Dominant left PICA and right AICA. The vertebral, carotid, and basilar arteries are smooth and widely patent. No branch occlusion or aneurysm. Accounting for motion there is symmetric flow in the right MCA. IMPRESSION: 1. 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 There is progressive swelling with up to 2 mm of midline shift. 2. Redemonstrated minimal acute infarct in left hemispheric white matter. 3. Negative motion degraded MRA.  The right MCA is patent. Electronically Signed   By: Monte Fantasia M.D.   On: 04/19/2019 11:50   MR BRAIN WO CONTRAST  Result Date: 04/17/2019 CLINICAL DATA:  Focal neuro deficit, greater than 6 hours, stroke suspected. EXAM: MRI HEAD WITHOUT CONTRAST TECHNIQUE: Multiplanar, multiecho pulse sequences of the brain and surrounding structures were obtained without intravenous contrast. COMPARISON:  Noncontrast CT head, CT angiogram head/neck and CT perfusion performed earlier the same day 04/17/2019 FINDINGS: Brain: A limited protocol brain MRI was performed at the ordering provider's request consisting of only axial diffusion-weighted imaging and axial T2 FLAIR imaging. There is extensive multifocal restricted diffusion within the right MCA vascular territory, consistent with acute ischemic changes and involving portions of the right frontal, parietal, temporal and occipital lobes as well as right insular cortex, posterior limb of right internal capsule, right lentiform nucleus, right caudate body and right corona radiata. Of note, this includes acute ischemic change within the right motor strip. The largest region of diffusion  weighted signal abnormality within the right temporal lobe measures 7.8 x 3.0 cm in transaxial dimensions (series 4, image 23). There is T2/FLAIR hyperintensity which corresponds with some, but not all, of the acute ischemic change. For instance, there is little to no T2/FLAIR hyperintensity corresponding with the ischemic changes within the right corona radiata, basal ganglia and right insula at this time. Punctate acute cortical infarct within the left parietooccipital lobe (series 4, image 33). Additional probable punctate acute infarcts more superiorly within the left parietal lobe (series 4, image 35) and within the lateral left occipital lobe (series 4, image 24). Vascular: Poorly assessed on the acquired sequences. Skull and upper cervical spine: No evidence of focal marrow lesion on the acquired sequences. Sinuses/Orbits: Visualized orbits demonstrate no acute abnormality. Minimal ethmoid sinus mucosal thickening. No significant mastoid effusion. IMPRESSION: Extensive restricted diffusion consistent with acute ischemia involving the right MCA vascular territory. There is T2/FLAIR hyperintensity which corresponds with some, but not all, of the acute ischemic changes as described. Additional punctate acute  infarcts within the left parietal and occipital lobes. Electronically Signed   By: Kellie Simmering DO   On: 04/17/2019 12:50   IR CT Head Ltd  Result Date: 04/17/2019 INDICATION: New onset of left-sided weakness with right gaze deviation and dysarthria.  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  EXAM: 1. EMERGENT LARGE VESSEL OCCLUSION THROMBOLYSIS (anterior CIRCULATION)  COMPARISON:  CT angiogram of the head and neck of April 17, 2019.  MEDICATIONS: Ancef 2 g IV antibiotic was administered within 1 hour of the procedure.  ANESTHESIA/SEDATION: General anesthesia.  CONTRAST:  Isovue 300 approximately 65 mL.  FLUOROSCOPY TIME:  Fluoroscopy Time: 41 minutes 18  seconds (1531 mGy).  COMPLICATIONS: None immediate.  TECHNIQUE: Following a full explanation of the procedure along with the potential associated complications, an informed witnessed consent was obtained from the patient's wife and daughter. The risks of intracranial hemorrhage of 10%, worsening neurological deficit, ventilator dependency, death and inability to revascularize were all reviewed in detail with the patient's wife and daughter.  The patient was then put under general anesthesia by the Department of Anesthesiology at [facility].  The right groin was prepped and draped in the usual sterile fashion. Thereafter using modified Seldinger technique, transfemoral access into the right common femoral artery was obtained without difficulty. Over a 0.035 inch guidewire an 8 [place] Pinnacle sheath was inserted. Through this, and also over a 0.035 inch guidewire a 5 [place] JB 1 catheter was advanced to the aortic arch region and selectively positioned in the innominate artery and the right common carotid artery.  FINDINGS: The innominate artery arteriogram demonstrates the origin of the right subclavian artery and the right common carotid artery to be widely patent.  The right common carotid arteriogram demonstrates the right external carotid artery and its major branches to be widely patent.  The right internal carotid artery at the bulb to the cranial skull base demonstrates wide patency with mild FMD-like changes involving the mid cervical right ICA.  The petrous, cavernous and the supraclinoid segments are widely patent.  A right posterior communicating artery is seen opacifying the right posterior cerebral distribution.  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 The right anterior cerebral artery opacifies into the capillary and venous phases.  PROCEDURE: The diagnostic JB 1 catheter in right common carotid artery was exchanged over an 8 [place] 300 cm  Rosen exchange guidewire for an 087 balloon guide catheter which had been prepped with 50% contrast and 50% heparinized saline infusion. This was advanced to the right common carotid artery just proximal to the origin of the right internal carotid artery. The guidewire was removed. Good aspiration was obtained from the hub of the balloon guide catheter.  Gentle contrast injection demonstrated no evidence of spasms or of intraluminal filling defects. Over a 0.014 inch standard Synchro micro guidewire with a J configuration, the combination of the 021 Trevo ProVue microcatheter inside of a 6 [place] Catalyst 132 cm guide catheter was advanced to the distal end of the balloon guide catheter.  With the micro guidewire leading with a J-tip configuration to avoid dissections or inducing spasm, the combination was navigated without difficulty to the supraclinoid right ICA.  The occluded right middle cerebral artery was entered with a micro guidewire which was advanced to the M3 regions of the inferior division of the right middle cerebral artery followed by the microcatheter. The guidewire was removed. Good aspiration was obtained from the  hub of the microcatheter. Gentle contrast injection demonstrated safe position of tip of the microcatheter. This was then connected to continuous heparinized saline infusion.  A 4 mm x 40 mm Solitaire X retrieval device was then advanced to the distal end of the microcatheter. The O ring on the delivery microcatheter was then loosened. With slight forward gentle traction with the right hand on the delivery micro guidewire with left hand the delivery microcatheter was retrieved unsheathing the retrieval device.  The Catalyst 6 [place] guide catheter was advanced to the occluded right middle cerebral artery. With proximal flow arrest obtained by inflating the balloon in the right internal carotid artery, and after constant aspiration using a Penumbra aspiration device at the hub of the  6 [place] Catalyst guide catheter, and with a 60 mL syringe at the hub of the balloon guide catheter, the combination of the retrieval device, the microcatheter, and the 6 [place] Catalyst guide were retrieved and removed as constant aspiration was applied at the hub of the balloon guide catheter.  Proximal flow arrest was reversed. A control arteriogram performed through the balloon guide catheter which had been advanced into the mid right internal carotid artery demonstrated complete opacification of the right middle cerebral artery and the inferior division. However, the superior division remained occluded.  A second pass was then made again using the above combination. With the micro guidewire leading, access into the M3 region of the superior division was obtained with a microcatheter. The micro guidewire was removed. Good aspiration was established at the hub of the microcatheter. A gentle control arteriogram again demonstrates safe positioning of the tip of the microcatheter which was then connected to continuous heparinized saline infusion. Again with constant flow arrest in the right internal carotid artery, and aspiration at the hub of the Catalyst guide catheter using the Penumbra aspiration device at the origin of the superior division for 2 minutes, and with a 60 mL syringe at the hub of the balloon guide catheter, the combination of the retrieval device, the microcatheter and the 6 [place] Catalyst guide catheter were retrieved and removed. Following reversal of flow arrest, control arteriogram performed through the balloon guide catheter in the right internal carotid artery now demonstrated revascularization of the superior division and also further improved caliber of the inferior division.  There continued to be a very small branch extending into the anterior temporal region which was occluded in the M2 region. Also noted was a distal M3 M4 junction branch occlusion of a parietal branch of the  inferior division.  A TICI 2C revascularization had been achieved.  The right anterior cerebral artery remained widely patent with cross-filling via the anterior communicating artery of the left anterior cerebral A2 segment and distally.  The posterior communicating artery remain widely patent.  Moderate spasm in the distal cervical right ICA, and the superior division of the right middle cerebral artery responded to 2 aliquots of 25 mcg of nitroglycerin intra-arterially.  A final control arteriogram performed through the balloon guide catheter in the right common carotid artery demonstrated significantly improved caliber of the right internal carotid artery, with maintenance of a TICI 2c revascularization of the right middle cerebral artery distribution.  The balloon guide catheter was removed. The right groin 8 [place] Pinnacle sheath was then removed with manual compression in the right groin. Distal pulses remained Dopplerable in the dorsalis pedis, and the posterior tibial regions bilaterally unchanged.  A flat panel CT of the brain demonstrated heterogeneous hyper attenuation in the  right temporal lobe region anteriorly probably representing contrast stain versus less likely hemorrhagic conversion of infarct tissue.  There was no mass-effect noted on the right lateral ventricle. Patient was then transferred to the ICU unit for post thrombectomy management. Patient was left intubated on account of his COVID status.  IMPRESSION: Endovascular near complete revascularization of the right middle cerebral artery territory with 2 passes with the 4 mm x 40 mm Solitaire X retrieval device and Penumbra aspiration achieving a TICI 2c revascularization.  PLAN: Follow-up as per referring MD.   Electronically Signed   By: Luanne Bras M.D.   On: 04/18/2019 10:42   CT CEREBRAL PERFUSION W CONTRAST  Result Date: 04/17/2019 CLINICAL DATA:  Stroke, follow-up. EXAM: CT ANGIOGRAPHY HEAD AND NECK CT PERFUSION  BRAIN TECHNIQUE: Multidetector CT imaging of the head and neck was performed using the standard protocol during bolus administration of intravenous contrast. Multiplanar CT image reconstructions and MIPs were obtained to evaluate the vascular anatomy. Carotid stenosis measurements (when applicable) are obtained utilizing NASCET criteria, using the distal internal carotid diameter as the denominator. Multiphase CT imaging of the brain was performed following IV bolus contrast injection. Subsequent parametric perfusion maps were calculated using RAPID software. CONTRAST:  117m OMNIPAQUE IOHEXOL 350 MG/ML SOLN COMPARISON:  Noncontrast head CT performed earlier the same day. FINDINGS: CTA NECK FINDINGS Aortic arch: The origins of the innominate and left common carotid arteries are incompletely imaged. The visualized aortic arch is unremarkable. No significant innominate or proximal subclavian stenosis visualized. Right carotid system: CCA patent to the bifurcation without stenosis. Prominent predominantly calcified plaque within the distal common carotid artery and proximal ICA. There is likely greater than 70% stenosis at the origin of the ICA, although exact quantification of stenosis is difficult due to irregularity of calcified plaque. Distal to this the ICA is patent within the neck without stenosis. Left carotid system: CCA patent to the bifurcation without stenosis. Calcified plaque within the distal common carotid artery, carotid bifurcation and proximal ICA. Resultant 30-40% narrowing of the proximal ICA as compared to the more distal vessel. Distal to this the ICA is patent within the neck without stenosis. Vertebral arteries: Right vertebral artery dominant. 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 Calcified plaque at the origin of the medial V1 left vertebral artery branch with at least moderate ostial stenosis. More distally the bilateral vertebral arteries are patent within the neck without stenosis.  Skeleton: No acute bony abnormality. Cervical spondylosis with C5-C6 and C6-C7 posterior disc osteophytes. Other neck: No neck mass or cervical adenopathy. Upper chest: Paraseptal emphysema within the imaged lung apices. Also within the imaged lung apices but there is interstitial prominence suspicious for interstitial lung disease. There are more nodular opacities within the right upper lobe measuring up to 1.4 cm (series 7, image 56) (series 7, image 99). Review of the MIP images confirms the above findings CTA HEAD FINDINGS Anterior circulation: The intracranial internal carotid arteries are patent with scattered calcified plaque. No more than mild stenosis within these vessels. 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 There is minimal reconstitution of flow seen within M2 and more distal right MCA branch vessels. The left middle cerebral artery is patent without high-grade proximal stenosis. The bilateral anterior cerebral arteries are patent without significant proximal stenosis. Posterior circulation: The intracranial vertebral arteries are patent without significant stenosis, as is the basilar artery. The bilateral posterior cerebral arteries are patent without significant proximal stenosis. A sizable right posterior communicating artery  is present. The a left posterior communicating artery is poorly delineated and may be hypoplastic or absent. Venous sinuses: Within limitations of contrast timing, no convincing thrombus. Anatomic variants: As described Review of the MIP images confirms the above findings CT Brain Perfusion Findings: ASPECTS: 4 CBF (<30%) Volume: 77m (in the right MCA vascular territory) Perfusion (Tmax>6.0s) volume: 147 mL (in the right MCA vascular territory) Mismatch Volume: 1335mInfarction Location:Right MCA vascular territory Findings of a proximal M1 right MCA occlusion and the perfusion results were discussed by telephone at the time of interpretation on 04/17/2019 at  10:38 am to provider Dr. ArLorraine Laxwho verbally acknowledged these results. IMPRESSION: CTA neck: 1. The common and internal carotid arteries are patent within the neck bilaterally. 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 There is likely greater than 70% stenosis at the origin of the right ICA. 30-40% stenosis of the proximal left ICA. 2. The vertebral arteries are patent within the neck bilaterally. The right vertebral artery is dominant with moderate atherosclerotic narrowing at its origin. Duplicated V1 left vertebral artery. There is at least moderate ostial stenosis at the origin of the medial left V1 vertebral artery branch. 3. The partially imaged lung apices demonstrate findings suspicious for interstitial lung disease. Additionally, there are nodular opacities within the right lung apex measuring up to 1.4 cm. Dedicated chest CT is recommended for further evaluation when clinically feasible. CTA head: 1. 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 There is minimal reconstitution of flow within M2 and more distal right MCA branch vessels. 2. No other intracranial large vessel occlusion or proximal high-grade arterial stenosis identified. CT perfusion head: The perfusion software identifies a 10 mL core infarct within the right MCA vascular territory. However, please note more extensive infarction changes were appreciated on concurrently performed noncontrast head CT. The perfusion software detects a 147 mL region of hypoperfused parenchyma within the right MCA vascular territory. Reported mismatch 137 mL. Electronically Signed   By: KyKellie SimmeringO   On: 04/17/2019 11:04   DG Chest Port 1 View  Result Date: 04/18/2019 CLINICAL DATA:  Acute respiratory failure EXAM: PORTABLE CHEST 1 VIEW COMPARISON:  Radiograph 04/17/2019 FINDINGS: *Endotracheal tube in the mid trachea, 5.5 cm from the carina. *Telemetry leads overlie the chest. Slight improvement in the bilateral  airspace disease most pronounced in the left base and periphery and right mid lung. No new areas of airspace disease. No pneumothorax or visible effusion the portion of the costophrenic sulci on the left is collimated. Cardiomediastinal contours are stable with a calcified aorta. No acute osseous or soft tissue abnormality. IMPRESSION: Stable satisfactory positioning of the endotracheal tube. Slight interval improvement of bilateral airspace disease. Electronically Signed   By: PrLovena Le.D.   On: 04/18/2019 06:15   DG Chest Port 1 View  Result Date: 04/17/2019 CLINICAL DATA:  COVID-19. Intubation. EXAM: PORTABLE CHEST 1 VIEW COMPARISON:  Radiograph yesterday. FINDINGS: Endotracheal tube tip 5.8 cm from the carina. Probable esophageal temperature probe with tip at the level of the clavicular heads. No definite enteric tube is visualized. Patchy bilateral airspace disease within both lungs, slight worsening in the right midlung zone from prior. Unchanged heart size and mediastinal contours. No pneumothorax or large pleural effusion. No acute osseous abnormalities are seen. IMPRESSION: 1. Bilateral pneumonia, with slight worsening in the right mid lung since yesterday. 2. Endotracheal tube tip 5.8 cm from the carina. Probable esophageal temperature probe with tip at the clavicular heads. Electronically  Signed   By: Keith Rake M.D.   On: 04/17/2019 16:45   DG Chest Portable 1 View  Result Date: 04/16/2019 CLINICAL DATA:  Cough and fever. New weakness. EXAM: PORTABLE CHEST 1 VIEW COMPARISON:  08/11/2009 FINDINGS: Normal sized heart. Interval diffuse prominence of the interstitial markings and patchy opacities in the mid and lower lung zones bilaterally. No visible pleural fluid. Diffuse osteo IMPRESSION: Interval probable bilateral pneumonia and pneumonitis. Electronically Signed   By: Claudie Revering M.D.   On: 04/16/2019 14:28   IR PERCUTANEOUS ART THROMBECTOMY/INFUSION INTRACRANIAL INC DIAG  ANGIO  Result Date: 04/19/2019 INDICATION: New onset of left-sided weakness with right gaze deviation and dysarthria. 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 EXAM: 1. EMERGENT LARGE VESSEL OCCLUSION THROMBOLYSIS (anterior CIRCULATION) COMPARISON:  CT angiogram of the head and neck of April 17, 2019. MEDICATIONS: Ancef 2 g IV antibiotic was administered within 1 hour of the procedure. ANESTHESIA/SEDATION: General anesthesia. CONTRAST:  Isovue 300 approximately 65 mL. FLUOROSCOPY TIME:  Fluoroscopy Time: 41 minutes 18 seconds (1531 mGy). COMPLICATIONS: None immediate. TECHNIQUE: Following a full explanation of the procedure along with the potential associated complications, an informed witnessed consent was obtained from the patient's wife and daughter. The risks of intracranial hemorrhage of 10%, worsening neurological deficit, ventilator dependency, death and inability to revascularize were all reviewed in detail with the patient's wife and daughter. The patient was then put under general anesthesia by the Department of Anesthesiology at [facility]. The right groin was prepped and draped in the usual sterile fashion. Thereafter using modified Seldinger technique, transfemoral access into the right common femoral artery was obtained without difficulty. Over a 0.035 inch guidewire an 8 [place] Pinnacle sheath was inserted. Through this, and also over a 0.035 inch guidewire a 5 [place] JB 1 catheter was advanced to the aortic arch region and selectively positioned in the innominate artery and the right common carotid artery. FINDINGS: The innominate artery arteriogram demonstrates the origin of the right subclavian artery and the right common carotid artery to be widely patent. The right common carotid arteriogram demonstrates the right external carotid artery and its major branches to be widely patent. The right internal carotid artery at the bulb to the cranial  skull base demonstrates wide patency with mild FMD-like changes involving the mid cervical right ICA. The petrous, cavernous and the supraclinoid segments are widely patent. A right posterior communicating artery is seen opacifying the right posterior cerebral distribution. 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 The right anterior cerebral artery opacifies into the capillary and venous phases. PROCEDURE: The diagnostic JB 1 catheter in right common carotid artery was exchanged over an 8 [place] 300 cm Rosen exchange guidewire for an 087 balloon guide catheter which had been prepped with 50% contrast and 50% heparinized saline infusion. This was advanced to the right common carotid artery just proximal to the origin of the right internal carotid artery. The guidewire was removed. Good aspiration was obtained from the hub of the balloon guide catheter. Gentle contrast injection demonstrated no evidence of spasms or of intraluminal filling defects. Over a 0.014 inch standard Synchro micro guidewire with a J configuration, the combination of the 021 Trevo ProVue microcatheter inside of a 6 [place] Catalyst 132 cm guide catheter was advanced to the distal end of the balloon guide catheter. With the micro guidewire leading with a J-tip configuration to avoid dissections or inducing spasm, the combination was navigated  without difficulty to the supraclinoid right ICA. The occluded right middle cerebral artery was entered with a micro guidewire which was advanced to the M3 regions of the inferior division of the right middle cerebral artery followed by the microcatheter. The guidewire was removed. Good aspiration was obtained from the hub of the microcatheter. Gentle contrast injection demonstrated safe position of tip of the microcatheter. This was then connected to continuous heparinized saline infusion. A 4 mm x 40 mm Solitaire X retrieval device was then advanced to the distal end of  the microcatheter. The O ring on the delivery microcatheter was then loosened. With slight forward gentle traction with the right hand on the delivery micro guidewire with left hand the delivery microcatheter was retrieved unsheathing the retrieval device. The Catalyst 6 [place] guide catheter was advanced to the occluded right middle cerebral artery. With proximal flow arrest obtained by inflating the balloon in the right internal carotid artery, and after constant aspiration using a Penumbra aspiration device at the hub of the 6 [place] Catalyst guide catheter, and with a 60 mL syringe at the hub of the balloon guide catheter, the combination of the retrieval device, the microcatheter, and the 6 [place] Catalyst guide were retrieved and removed as constant aspiration was applied at the hub of the balloon guide catheter. Proximal flow arrest was reversed. A control arteriogram performed through the balloon guide catheter which had been advanced into the mid right internal carotid artery demonstrated complete opacification of the right middle cerebral artery and the inferior division. However, the superior division remained occluded. A second pass was then made again using the above combination. With the micro guidewire leading, access into the M3 region of the superior division was obtained with a microcatheter. The micro guidewire was removed. Good aspiration was established at the hub of the microcatheter. A gentle control arteriogram again demonstrates safe positioning of the tip of the microcatheter which was then connected to continuous heparinized saline infusion. Again with constant flow arrest in the right internal carotid artery, and aspiration at the hub of the Catalyst guide catheter using the Penumbra aspiration device at the origin of the superior division for 2 minutes, and with a 60 mL syringe at the hub of the balloon guide catheter, the combination of the retrieval device, the microcatheter and the 6  [place] Catalyst guide catheter were retrieved and removed. Following reversal of flow arrest, control arteriogram performed through the balloon guide catheter in the right internal carotid artery now demonstrated revascularization of the superior division and also further improved caliber of the inferior division. There continued to be a very small branch extending into the anterior temporal region which was occluded in the M2 region. Also noted was a distal M3 M4 junction branch occlusion of a parietal branch of the inferior division. A TICI 2C revascularization had been achieved. The right anterior cerebral artery remained widely patent with cross-filling via the anterior communicating artery of the left anterior cerebral A2 segment and distally. The posterior communicating artery remain widely patent. Moderate spasm in the distal cervical right ICA, and the superior division of the right middle cerebral artery responded to 2 aliquots of 25 mcg of nitroglycerin intra-arterially. A final control arteriogram performed through the balloon guide catheter in the right common carotid artery demonstrated significantly improved caliber of the right internal carotid artery, with maintenance of a TICI 2c revascularization of the right middle cerebral artery distribution. The balloon guide catheter was removed. The right groin 8 [place] Pinnacle sheath  was then removed with manual compression in the right groin. Distal pulses remained Dopplerable in the dorsalis pedis, and the posterior tibial regions bilaterally unchanged. A flat panel CT of the brain demonstrated heterogeneous hyper attenuation in the right temporal lobe region anteriorly probably representing contrast stain versus less likely hemorrhagic conversion of infarct tissue. There was no mass-effect noted on the right lateral ventricle. Patient was then transferred to the ICU unit for post thrombectomy management. Patient was left intubated on account of his COVID  status. IMPRESSION: Endovascular near complete revascularization of the right middle cerebral artery territory with 2 passes with the 4 mm x 40 mm Solitaire X retrieval device and Penumbra aspiration achieving a TICI 2c revascularization. PLAN: Follow-up as per referring MD. Electronically Signed   By: Luanne Bras M.D.   On: 04/18/2019 10:42   CT HEAD CODE STROKE WO CONTRAST  Result Date: 04/17/2019 CLINICAL DATA:  Code stroke. Left-sided facial droop/weakness, last known well noon yesterday EXAM: CT HEAD WITHOUT CONTRAST TECHNIQUE: Contiguous axial images were obtained from the base of the skull through the vertex without intravenous contrast. COMPARISON:  No pertinent prior studies available for comparison. 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 Subtle petechial hemorrhage is questioned within a region of infarction within the anterior right frontal lobe (series 2, image 19). No significant mass effect or midline shift. No extra-axial fluid collection. Background mild ill-defined hypoattenuation within the cerebral white matter is nonspecific, but consistent with chronic small vessel ischemic disease. Mild generalized parenchymal atrophy. Vascular: Abnormal hyperdensity of the M1 right MCA likely reflecting thrombus. Atherosclerotic calcifications. Skull: No calvarial fracture or suspicious osseous lesion. Sinuses/Orbits: Visualized orbits demonstrate no acute abnormality. Mild paranasal sinus mucosal thickening. No significant mastoid effusion. ASPECTS ([place] Stroke Program Early CT Score) - Ganglionic level infarction (caudate, lentiform nuclei, internal capsule, insula, M1-M3 cortex): 4 (points subtracted for M2, M3 and insula) - Supraganglionic infarction (M4-M6 cortex): 0  Total score (0-10 with 10 being normal): 4 These results were communicated to Dr. Lorraine Lax At 10:38 amon 2/10/2021by text page via the [facility] messaging system. IMPRESSION: 1. 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 ASPECTS 4. Subtle petechial hemorrhage is questioned within an anterior right frontal lobe infarct site. No significant mass effect. 2. Hyperdensity of the M1 right MCA likely reflecting thrombus. 3. Mild generalized parenchymal atrophy and chronic small vessel ischemic disease. Electronically Signed   By: Kellie Simmering DO   On: 04/17/2019 10:38   VAS [place] LOWER EXTREMITY VENOUS (DVT)  Result Date: 04/18/2019  Lower Venous DVTStudy Indications: Elevated Ddimer.  Risk Factors: COVID 19 positive. Limitations: Bandages, open wound and patient positioning. Comparison Study: No prior studies. Performing Technologist: Oliver Hum RVT  Examination Guidelines: A complete evaluation includes B-mode imaging, spectral Doppler, color Doppler, and power Doppler as needed of all accessible portions of each vessel. Bilateral testing is considered an integral part of a complete examination. Limited examinations for reoccurring indications may be performed as noted. The reflux portion of the exam is performed with the patient in reverse Trendelenburg.  +---------+---------------+---------+-----------+----------+--------------+  RIGHT     Compressibility Phasicity Spontaneity Properties Thrombus Aging  +---------+---------------+---------+-----------+----------+--------------+  CFV  Not visualized  +---------+---------------+---------+-----------+----------+--------------+  SFJ                                                        Not visualized  +---------+---------------+---------+-----------+----------+--------------+  FV Prox   Full            Yes       Yes                                     +---------+---------------+---------+-----------+----------+--------------+  FV Mid    Full                                                             +---------+---------------+---------+-----------+----------+--------------+  FV Distal Full                                                             +---------+---------------+---------+-----------+----------+--------------+  PFV       Full                                                             +---------+---------------+---------+-----------+----------+--------------+  POP       Full            Yes       Yes                                    +---------+---------------+---------+-----------+----------+--------------+  PTV       Full                                                             +---------+---------------+---------+-----------+----------+--------------+  PERO      Full                                                             +---------+---------------+---------+-----------+----------+--------------+ Unable to visualize the CFV, SFJ due to bandages.  +---------+---------------+---------+-----------+----------+--------------+  LEFT      Compressibility Phasicity Spontaneity Properties Thrombus Aging  +---------+---------------+---------+-----------+----------+--------------+  CFV       Full            Yes       Yes                                    +---------+---------------+---------+-----------+----------+--------------+  SFJ       Full                                                             +---------+---------------+---------+-----------+----------+--------------+  FV Prox   Full                                                             +---------+---------------+---------+-----------+----------+--------------+  FV Mid    Full                                                             +---------+---------------+---------+-----------+----------+--------------+  FV Distal Full                                                              +---------+---------------+---------+-----------+----------+--------------+  PFV       Full                                                             +---------+---------------+---------+-----------+----------+--------------+  POP       Full            Yes       Yes                                    +---------+---------------+---------+-----------+----------+--------------+  PTV       Full                                                             +---------+---------------+---------+-----------+----------+--------------+  PERO      Full                                                             +---------+---------------+---------+-----------+----------+--------------+  Gastroc   None                                             Acute           +---------+---------------+---------+-----------+----------+--------------+  Summary: RIGHT: - There is no evidence of deep vein thrombosis in the lower extremity.  - No cystic structure found in the popliteal fossa.  LEFT: - Findings consistent with acute deep vein thrombosis involving the left gastrocnemius veins. - No cystic structure found in the popliteal fossa.  *See table(s) above for measurements and observations. Electronically signed by Servando Snare MD on 04/18/2019 at 6:33:22 PM.    Final    ECHOCARDIOGRAM LIMITED  Result Date: 04/18/2019    ECHOCARDIOGRAM LIMITED REPORT   Patient Name:   KEVONTA PHARISS Date of Exam: 04/17/2019 Medical Rec #:  [id number]       Height:       68.0 in Accession #:    [id number]      Weight:       180.0 lb Date of Birth:  28-May-1940       BSA:          1.95 m Patient Age:    66 years        BP:           141/51 mmHg Patient Gender: M               HR:           57 bpm. Exam Location:  Inpatient Procedure: Limited Echo and Cardiac Doppler Indications:    Stroke 434.91 / I163.9  History:        Patient has no prior history of Echocardiogram examinations.                 Signs/Symptoms:Chest Pain; Risk Factors:Hypertension.  Pneumonia                 due to COVID-19 virus.  Sonographer:    Jaquita Folds Referring Phys: [place]  1. Left ventricular ejection fraction, by estimation, is 55 to 60%. The left ventricle has normal function. The left ventrical has no regional wall motion abnormalities. Left ventricular diastolic parameters are indeterminate.  2. Right ventricular systolic function is normal. The right ventricular size is normal. There is normal pulmonary artery systolic pressure.  3. The mitral valve is normal in structure and function. trivial mitral valve regurgitation. No evidence of mitral stenosis.  4. The aortic valve is normal in structure and function. Aortic valve regurgitation is not visualized. No aortic stenosis is present. FINDINGS  Left Ventricle: Left ventricular ejection fraction, by estimation, is 55 to 60%. The left ventricle has normal function. The left ventricle has no regional wall motion abnormalities. There is no left ventricular hypertrophy. Right Ventricle: The right ventricular size is normal. No increase in right ventricular wall thickness. Right ventricular systolic function is normal. There is normal pulmonary artery systolic pressure. The tricuspid regurgitant velocity is 1.86 m/s, and  with an assumed right atrial pressure of 3 mmHg, the estimated right ventricular systolic pressure is 07.6 mmHg. Mitral Valve: The mitral valve is normal in structure and function. Trivial mitral valve regurgitation. No evidence of mitral valve stenosis. Tricuspid Valve: The tricuspid valve is grossly normal. Tricuspid valve regurgitation is mild. Aortic Valve: The aortic valve is normal in structure and function. Aortic valve regurgitation is not visualized. No aortic stenosis is present. Aorta: The aortic root, ascending aorta and aortic arch are all structurally normal, with no evidence of dilitation or obstruction.  LEFT VENTRICLE PLAX 2D LVIDd:         4.60 cm  Diastology LVIDs:  3.60 cm  LV e' lateral:   8.04 cm/s LV PW:         0.90 cm  LV E/e' lateral: 10.1 LV IVS:        0.90 cm  LV e' medial:    5.91 cm/s LVOT diam:     2.00 cm  LV E/e' medial:  13.8 LV SV:         55.61 ml LV SV Index:   21.50 LVOT Area:     3.14 cm  LEFT ATRIUM             Index LA diam:        4.30 cm 2.20 cm/m LA Vol (A2C):   50.7 ml 25.94 ml/m LA Vol (A4C):   43.8 ml 22.41 ml/m LA Biplane Vol: 48.7 ml 24.92 ml/m  AORTIC VALVE LVOT Vmax:   79.40 cm/s LVOT Vmean:  57.900 cm/s LVOT VTI:    0.177 m  AORTA Ao Root diam: 2.70 cm MITRAL VALVE                        TRICUSPID VALVE MV Area (PHT): 2.50 cm             TR Peak grad:   13.8 mmHg MV Decel Time: 303 msec             TR Vmax:        186.00 cm/s MV E velocity: 81.60 cm/s 103 cm/s MV A velocity: 74.40 cm/s 70.3 cm/s SHUNTS MV E/A ratio:  1.10       1.5       Systemic VTI:  0.18 m                                     Systemic Diam: 2.00 cm Mertie Moores MD Electronically signed by Mertie Moores MD Signature Date/Time: 04/18/2019/5:07:36 PM    Final

## 2019-04-21 ENCOUNTER — Inpatient Hospital Stay (HOSPITAL_COMMUNITY): Payer: Medicare HMO

## 2019-04-21 LAB — CULTURE, BLOOD (ROUTINE X 2)
Culture: NO GROWTH
Culture: NO GROWTH

## 2019-04-21 LAB — COMPREHENSIVE METABOLIC PANEL
ALT: 36 U/L (ref 0–44)
AST: 30 U/L (ref 15–41)
Albumin: 2 g/dL — ABNORMAL LOW (ref 3.5–5.0)
Alkaline Phosphatase: 86 U/L (ref 38–126)
Anion gap: 4 — ABNORMAL LOW (ref 5–15)
BUN: 22 mg/dL (ref 8–23)
CO2: 24 mmol/L (ref 22–32)
Calcium: 8.4 mg/dL — ABNORMAL LOW (ref 8.9–10.3)
Chloride: 109 mmol/L (ref 98–111)
Creatinine, Ser: 0.67 mg/dL (ref 0.61–1.24)
GFR calc Af Amer: >60 mL/min (ref >60)
GFR calc non Af Amer: >60 mL/min (ref >60)
Glucose, Bld: 135 mg/dL — ABNORMAL HIGH (ref 70–99)
Potassium: 4.2 mmol/L (ref 3.5–5.1)
Sodium: 137 mmol/L (ref 135–145)
Total Bilirubin: 0.7 mg/dL (ref 0.3–1.2)
Total Protein: 5.7 g/dL — ABNORMAL LOW (ref 6.5–8.1)

## 2019-04-21 LAB — CBC
HCT: 34 % — ABNORMAL LOW (ref 39.0–52.0)
Hemoglobin: 11.4 g/dL — ABNORMAL LOW (ref 13.0–17.0)
MCH: 37.1 pg — ABNORMAL HIGH (ref 26.0–34.0)
MCHC: 33.5 g/dL (ref 30.0–36.0)
MCV: 110.7 fL — ABNORMAL HIGH (ref 80.0–100.0)
Platelets: 158 10*3/uL (ref 150–400)
RBC: 3.07 MIL/uL — ABNORMAL LOW (ref 4.22–5.81)
RDW: 14 % (ref 11.5–15.5)
WBC: 7.9 10*3/uL (ref 4.0–10.5)
nRBC: 0 % (ref 0.0–0.2)

## 2019-04-21 LAB — FERRITIN: Ferritin: 716 ng/mL — ABNORMAL HIGH (ref 24–336)

## 2019-04-21 LAB — PROCALCITONIN: Procalcitonin: 0.17 ng/mL

## 2019-04-21 LAB — PHOSPHORUS: Phosphorus: 2.9 mg/dL (ref 2.5–4.6)

## 2019-04-21 LAB — GLUCOSE, CAPILLARY
Glucose-Capillary: 141 mg/dL — ABNORMAL HIGH (ref 70–99)
Glucose-Capillary: 118 mg/dL — ABNORMAL HIGH (ref 70–99)
Glucose-Capillary: 167 mg/dL — ABNORMAL HIGH (ref 70–99)
Glucose-Capillary: 109 mg/dL — ABNORMAL HIGH (ref 70–99)
Glucose-Capillary: 165 mg/dL — ABNORMAL HIGH (ref 70–99)

## 2019-04-21 LAB — MAGNESIUM: Magnesium: 2 mg/dL (ref 1.7–2.4)

## 2019-04-21 LAB — C-REACTIVE PROTEIN: CRP: 16.4 mg/dL — ABNORMAL HIGH (ref <1.0)

## 2019-04-21 LAB — D-DIMER, QUANTITATIVE: D-Dimer, Quant: 3.45 ug/mL-FEU — ABNORMAL HIGH (ref 0.00–0.50)

## 2019-04-21 LAB — TRIGLYCERIDES: Triglycerides: 84 mg/dL (ref <150)

## 2019-04-21 LAB — HEPARIN LEVEL (UNFRACTIONATED)
Heparin Unfractionated: 0.23 IU/mL — ABNORMAL LOW (ref 0.30–0.70)
Heparin Unfractionated: 0.4 IU/mL (ref 0.30–0.70)

## 2019-04-21 MED ORDER — DOCUSATE SODIUM 100 MG PO CAPS
200.0000 mg | ORAL_CAPSULE | Freq: Two times a day (BID) | ORAL | Status: DC
  Administered 2019-04-21 – 2019-04-25 (×9): 200 mg via ORAL
  Filled 2019-04-21 (×9): qty 2

## 2019-04-21 MED ORDER — POLYETHYLENE GLYCOL 3350 17 G PO PACK
17.0000 g | PACK | Freq: Two times a day (BID) | ORAL | Status: DC
  Administered 2019-04-21 – 2019-04-25 (×9): 17 g via ORAL
  Filled 2019-04-21 (×9): qty 1

## 2019-04-21 NOTE — Progress Notes (Signed)
Patient's daughter, Abigail Butts, called and asked for update on her father.  Abigail Butts also told me that they had sent an iPad with some of the patient's belongings in order to be able to [employer] with the patient more easily/frequently.  I attempted to set up their personal iPad so they could see each other before bed, but the patient fell asleep.  Abigail Butts called back and I told her that he had fallen asleep, will try again tomorrow.  All concerns/questions addressed.

## 2019-04-21 NOTE — Progress Notes (Signed)
   04/21/19 1433  Family/Significant Other Communication  Family/Significant Other Update Called;Updated (pts daughter Claiborne Billings )

## 2019-04-21 NOTE — Progress Notes (Signed)
ANTICOAGULATION CONSULT NOTE   Pharmacy Consult for Heparin Indication: 2/11 LLE DVT and acute stroke   Patient Measurements: Weight: 176 lb 9.4 oz (80.1 kg) Heparin Dosing Weight: 80kg  Vital Signs: Temp: 98.8 F (37.1 C) (02/14 1200) Temp Source: Oral (02/14 1200) BP: 124/74 (02/14 1200) Pulse Rate: 63 (02/14 1200)  Labs: Recent Labs    04/19/19 0215 04/19/19 1202 04/20/19 0826 04/20/19 0826 04/20/19 1815 04/21/19 0345 04/21/19 1442  HGB 11.3*  --  11.5*  --   --  11.4*  --   HCT 33.2*  --  32.9*  --   --  34.0*  --   PLT 124*  --  139*  --   --  158  --   HEPARINUNFRC  --    < > 0.50   < > 0.39 0.23* 0.40  CREATININE 0.87  --  0.72  --   --  0.67  --    < > = values in this interval not displayed.    Estimated Creatinine Clearance: 74.8 mL/min (by C-G formula based on SCr of 0.67 mg/dL).  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 Pharmacy consulted for heparin for DVT. Will target lower goal range of 0.3-0.5 due to CVA.   Repeat heparin level is therapeutic at 0.4.   Goal of Therapy:  Heparin level 0.3-0.5 units/ml (due to CVA)  Monitor platelets by anticoagulation protocol: Yes   Plan: -Continue heparin 1450 units/h -Daily heparin level in am   Arrie Senate, PharmD, BCPS Clinical Pharmacist [phone] Please check AMION for all [place] numbers 04/21/2019

## 2019-04-21 NOTE — Progress Notes (Signed)
ANTICOAGULATION CONSULT NOTE   Pharmacy Consult for Heparin Indication: 2/11 LLE DVT and acute stroke   Patient Measurements: Weight: 176 lb 9.4 oz (80.1 kg) Heparin Dosing Weight: 80kg  Vital Signs: Temp: 98.4 F (36.9 C) (02/14 0400) Temp Source: Axillary (02/13 1946) BP: 124/70 (02/14 0400) Pulse Rate: 51 (02/14 0400)  Labs: Recent Labs    04/19/19 0215 04/19/19 1202 04/20/19 0826 04/20/19 1815 04/21/19 0345  HGB 11.3*  --  11.5*  --  11.4*  HCT 33.2*  --  32.9*  --  34.0*  PLT 124*  --  139*  --  158  HEPARINUNFRC  --    < > 0.50 0.39 0.23*  CREATININE 0.87  --  0.72  --  0.67   < > = values in this interval not displayed.    Estimated Creatinine Clearance: 74.8 mL/min (by C-G formula based on SCr of 0.67 mg/dL).  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 Pharmacy consulted for heparin for DVT. Will target lower goal range of 0.3-0.5 due to CVA.   Heparin level 0.23 is subtherapeutic on heparin 1400 units/hr. Hgb 11.4 and stable. Plt 158 which is stable for the patient. No reported bleeding. Confirmed current rate with RN and no issues with IV access or infusion.    Goal of Therapy:  Heparin level 0.3-0.5 units/ml (due to CVA)  Monitor platelets by anticoagulation protocol: Yes   Plan: Increase heparin slightly to 1450 units/hr to ensure remains in targeted range Check heparin level at 1600 Monitor heparin level, CBC, and S/S of bleeding  Follow up transition to oral anticoagulant   Cristela Felt, PharmD PGY1 Pharmacy Resident [employer]: [phone]

## 2019-04-21 NOTE — Progress Notes (Signed)
facetime visit set up for patient's family.   Hiram Comber, RN 04/21/2019 6:27 PM

## 2019-04-21 NOTE — Progress Notes (Signed)
PROGRESS NOTE                                                                                                                                                                                                             Patient Demographics:    Henry [name], is a 79 y.o. male, DOB - May 10, 1940, PYP:[id number]  Outpatient Primary MD for the patient is [name], [name], MD   Admit date - 04/17/2019   LOS - 4  No chief complaint on file.      Brief Narrative: Patient is a 79 y.o. male with PMHx of HTN, polycythemia, chronic GERD, chronic low back pain who was admitted on 2/10 following a right MCA stroke-patient was taken by IR for revascularization procedure as patient was outside the TPA window-following which he was admitted to the ICU and required ventilator support.  Patient was also found to have COVID-19.  Upon clinical stability-and extubation-patient was transferred to the [facility] on 2/12.  Significant events: 2/10>> admitted with acute right MCA stroke-to IR for revascularization of Right MCA-return to ICU on a ventilator 2/11>> extubated 2/12>> transfer to [facility]   Subjective:   Patient in bed, appears comfortable, denies any headache, no fever, no chest pain or pressure, no shortness of breath , no abdominal pain. No focal weakness.    Assessment  & Plan :   Acute Hypoxic Resp Failure due to Covid 19 Viral pneumonia: Remains stable-on just 1 L of oxygen-he was extubated on 2/12-CRP has significantly jumped up today-he had a fever yesterday afternoon-he is at high risk for aspiration pneumonia.  Sputum cultures positive on 2/10 for MSSA.  He is also status post extubation on 2/12.  Suspect that he may have superimposed aspiration pneumonia - on Ancef.  Continue steroids/remdesivir.  Trend procalcitonin clinically improving.   O2 requirements:  SpO2: 93 % O2 Flow Rate (L/min): 1 L/min FiO2 (%):  28 %   COVID-19 Labs: Recent Labs    04/19/19 0215 04/20/19 0826 04/21/19 0345  DDIMER 18.73* 5.47* 3.45*  FERRITIN 485* 184 716*  CRP 9.7* 18.9* 16.4*    No results found for: BNP  Recent Labs  Lab 04/17/19 1630 04/21/19 0345  PROCALCITON 0.10 0.17    Lab Results  Component Value Date   SARSCOV2NAA POSITIVE (A) 04/16/2019     COVID-19 Medications:  Steroids: 2/10>> Remdesivir: 2/10>>  Antibiotics: Ancef: 2/13>>  Prone/Incentive Spirometry: encouraged incentive spirometry use 3-4/hour.   Left lower extremity DVT: Likely provoked by COVID-19-on IV heparin.  Large right MCA and punctate left parietal/occipital lobe infarct: On IV heparin and aspirin.  Patient is s/p revascularization of right M1 occlusion by interventional radiology.  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  Echo with preserved EF and without any embolic foci.  LDL 53, A1c 6.8.  Has gross dysarthria, left facial droop, and significant weakness in the left upper extremity.  Will await further recommendations from stroke MD.  Lab Results  Component Value Date   CHOL 93 04/18/2019   HDL 19 (L) 04/18/2019   LDLCALC 53 04/18/2019   LDLDIRECT 135.0 02/24/2017   TRIG 84 04/21/2019   CHOLHDL 4.9 04/18/2019    Lab Results  Component Value Date   HGBA1C 6.8 (H) 04/17/2019    TTE -  1. Left ventricular ejection fraction, by estimation, is 55 to 60%. The left ventricle has normal function. The left ventrical has no regional wall motion abnormalities. Left ventricular diastolic parameters are  indeterminate.  2. Right ventricular systolic function is normal. The right ventricular size is normal. There is normal pulmonary artery systolic pressure.  3. The mitral valve is normal in structure and function. trivial mitral valve regurgitation. No evidence of mitral stenosis.  4. The aortic valve is normal in structure and function. Aortic valve regurgitation is not visualized. No aortic stenosis is present.     Dysphagia: Secondary to CVA-followed by SLP-on dysphagia 1 diet.  Maintain aspiration precautions  HTN: BP controlled-not on any antihypertensives.  Allow permissive hypertension.  Consults  :  Neuro/PCCM  Procedures  :   ETT:2/10>>2/11 Revascularization of right proximal MCA 2/10     Condition -   Guarded  Family Communication  :  Daughter updated over the phone 04/21/2019  Code Status :  Full Code  Diet :  Diet Order            DIET - DYS 1 Room service appropriate? No; Fluid consistency: Nectar Thick  Diet effective now               Disposition Plan  :  Remain hospitalized-CIR versus SNF on discharge.  Barriers to discharge: Hypoxia requiring O2 supplementation/complete 5 days of IV Remdesivir.work up for CVA in progress  Antimicorbials  :    Anti-infectives (From admission, onward)   Start     Dose/Rate Route Frequency Ordered Stop   04/20/19 1615  ceFAZolin (ANCEF) IVPB 1 g/50 mL premix     1 g 100 mL/hr over 30 Minutes Intravenous Every 8 hours 04/20/19 1602     04/18/19 1000  remdesivir 100 mg in sodium chloride 0.9 % 100 mL IVPB     100 mg 200 mL/hr over 30 Minutes Intravenous Daily 04/17/19 1457 04/21/19 1024   04/17/19 1630  remdesivir 200 mg in sodium chloride 0.9% 250 mL IVPB     200 mg 580 mL/hr over 30 Minutes Intravenous Once 04/17/19 1457 04/17/19 1908   04/17/19 1128  ceFAZolin (ANCEF) 2-4 GM/100ML-% IVPB    Note to Pharmacy: Fredric Dine   : cabinet override      04/17/19 1128 04/17/19 1543     DVT Prophylaxis  : IV heparin  Inpatient Medications  Scheduled Meds: .  stroke: mapping our early stages of recovery book   Does not apply Once  . vitamin C  500 mg Per Tube Daily  .  chlorhexidine gluconate (MEDLINE KIT)  15 mL Mouth Rinse BID  . dexamethasone (DECADRON) injection  6 mg Intravenous Q24H  . docusate sodium  200 mg Oral BID  . folic acid  1 mg Intravenous Daily  . insulin aspart  0-9 Units Subcutaneous TID WC  .  polyethylene glycol  17 g Oral BID  . thiamine injection  100 mg Intravenous Daily  . zinc sulfate  220 mg Per Tube Daily   Continuous Infusions: . sodium chloride 50 mL/hr at 04/21/19 0205  .  ceFAZolin (ANCEF) IV 1 g (04/21/19 1304)  . heparin 1,450 Units/hr (04/21/19 1306)   PRN Meds:.[DISCONTINUED] acetaminophen **OR** acetaminophen (TYLENOL) oral liquid 160 mg/5 mL **OR** [DISCONTINUED] acetaminophen, [DISCONTINUED] acetaminophen **OR** acetaminophen (TYLENOL) oral liquid 160 mg/5 mL **OR** [DISCONTINUED] acetaminophen, hydrALAZINE, iohexol, Resource ThickenUp Clear, senna-docusate   Time Spent in minutes  25  See all Orders from today for further details   Lala Lund M.D on 04/21/2019 at 1:51 PM  To page go to [email or web address] - use universal password  [facility] -  Office  [phone]    Objective:   Vitals:   04/20/19 2000 04/21/19 0000 04/21/19 0400 04/21/19 0800  BP: (!) 108/49 (!) 102/59 124/70 132/74  Pulse: (!) 58 (!) 52 (!) 51 (!) 50  Resp: (!) 23 (!) 25 (!) 22 (!) 22  Temp:   98.4 F (36.9 C)   TempSrc:      SpO2: 91% 93% 92% 93%  Weight:        Wt Readings from Last 3 Encounters:  04/18/19 80.1 kg  04/16/19 81.6 kg  08/24/18 81.6 kg     Intake/Output Summary (Last 24 hours) at 04/21/2019 1351 Last data filed at 04/21/2019 1313 Gross per 24 hour  Intake 3004.74 ml  Output 1200 ml  Net 1804.74 ml     Physical Exam  Awake Alert, left-sided facial droop and arm weakness, arm strength 1/5, leg strength 3/5, [place].AT,PERRAL Supple Neck,No JVD, No cervical lymphadenopathy appriciated.  Symmetrical Chest wall movement, Good air movement bilaterally, CTAB RRR,No Gallops, Rubs or new Murmurs, No Parasternal Heave +ve B.Sounds, Abd Soft, No tenderness, No organomegaly appriciated, No rebound - guarding or rigidity. No Cyanosis, Clubbing or edema, No new Rash or bruise    Data Review:    CBC Recent Labs  Lab 04/16/19 1358 04/16/19 1358  04/17/19 1002 04/17/19 1012 04/17/19 1557 04/18/19 0822 04/19/19 0215 04/20/19 0826 04/21/19 0345  WBC 5.3   < > 5.1  --   --  6.4 7.8 10.2 7.9  HGB 13.8   < > 12.7*   < > 10.9* 11.5* 11.3* 11.5* 11.4*  HCT 40.5   < > 37.8*   < > 32.0* 33.9* 33.2* 32.9* 34.0*  PLT 145*   < > 130*  --   --  134* 124* 139* 158  MCV 108.6*   < > 109.6*  --   --  108.0* 109.9* 107.5* 110.7*  MCH 37.0*   < > 36.8*  --   --  36.6* 37.4* 37.6* 37.1*  MCHC 34.1   < > 33.6  --   --  33.9 34.0 35.0 33.5  RDW 13.7   < > 13.7  --   --  14.0 13.9 13.6 14.0  LYMPHSABS 1.0  --  0.9  --   --  1.0  --   --   --   MONOABS 0.5  --  0.5  --   --  0.7  --   --   --  EOSABS 0.1  --  0.1  --   --  0.0  --   --   --   BASOSABS 0.0  --  0.0  --   --  0.0  --   --   --    < > = values in this interval not displayed.    Chemistries  Recent Labs  Lab 04/17/19 1002 04/17/19 1002 04/17/19 1012 04/17/19 1012 04/17/19 1557 04/18/19 0822 04/19/19 0215 04/20/19 0826 04/21/19 0345  NA 136   < > 136   < > 137 140 139 137 137  K 4.0   < > 3.8   < > 4.1 4.1 4.1 4.0 4.2  CL 103   < > 102  --   --  108 108 106 109  CO2 21*  --   --   --   --  _0 GLUCOSE 158*   < > 150*  --   --  110* 125* 116* 135*  BUN 18   < > 18  --   --  _1 CREATININE 0.95   < > 0.70  --   --  0.79 0.87 0.72 0.67  CALCIUM 8.5*  --   --   --   --  8.1* 8.4* 8.3* 8.4*  MG  --   --   --   --   --   --  2.0 1.9 2.0  AST 36  --   --   --   --  32 32 42* 30  ALT 33  --   --   --   --  27 26 41 36  ALKPHOS 71  --   --   --   --  69 70 80 86  BILITOT 1.0  --   --   --   --  0.9 0.9 1.3* 0.7   < > = values in this interval not displayed.   ------------------------------------------------------------------------------------------------------------------ Recent Labs    04/20/19 0826 04/21/19 0345  TRIG 70 84    Lab Results  Component Value Date   HGBA1C 6.8 (H) 04/17/2019    ------------------------------------------------------------------------------------------------------------------ No results for input(s): TSH, T4TOTAL, T3FREE, THYROIDAB in the last 72 hours.  Invalid input(s): FREET3 ------------------------------------------------------------------------------------------------------------------ Recent Labs    04/20/19 0826 04/21/19 0345  FERRITIN 184 716*    Coagulation profile Recent Labs  Lab 04/17/19 1630  INR 1.2    Recent Labs    04/20/19 0826 04/21/19 0345  DDIMER 5.47* 3.45*    Cardiac Enzymes No results for input(s): CKMB, TROPONINI, MYOGLOBIN in the last 168 hours.  Invalid input(s): CK ------------------------------------------------------------------------------------------------------------------ No results found for: BNP  Micro Results Recent Results (from the past 240 hour(s))  Culture, blood (routine x 2)     Status: None (Preliminary result)   Collection Time: 04/16/19  1:45 PM   Specimen: BLOOD  Result Value Ref Range Status   Specimen Description BLOOD LEFT ANTECUBITAL  Final   Special Requests   Final    BOTTLES DRAWN AEROBIC AND ANAEROBIC Blood Culture results may not be optimal due to an inadequate volume of blood received in culture bottles   Culture   Final    NO GROWTH 4 DAYS Performed at [facility] [address], [place], [place] [address]    Report Status PENDING  Incomplete  Culture, blood (routine x 2)     Status: None (Preliminary result)   Collection Time: 04/16/19  2:49 PM  Specimen: BLOOD RIGHT HAND  Result Value Ref Range Status   Specimen Description BLOOD RIGHT HAND  Final   Special Requests   Final    BOTTLES DRAWN AEROBIC AND ANAEROBIC Blood Culture results may not be optimal due to an inadequate volume of blood received in culture bottles   Culture   Final    NO GROWTH 4 DAYS Performed at [facility], [place] [address] [place], [place] [address]    Report Status  PENDING  Incomplete  Urine culture     Status: Abnormal   Collection Time: 04/16/19  4:45 PM   Specimen: Urine, Random  Result Value Ref Range Status   Specimen Description URINE, RANDOM  Final   Special Requests NONE  Final   Culture (A)  Final    <10,000 COLONIES/mL INSIGNIFICANT GROWTH Performed at [facility], [place] [address] [place], [place] [address]    Report Status 04/17/2019 FINAL  Final  Respiratory Panel by RT PCR (Flu A&B, Covid) -     Status: Abnormal   Collection Time: 04/16/19  5:30 PM  Result Value Ref Range Status   SARS Coronavirus 2 by RT PCR POSITIVE (A) NEGATIVE Final    Comment: RESULT CALLED TO, READ BACK BY AND VERIFIED WITH: Dois Davenport RN [id number] 04/16/19 A BROWNING (NOTE) SARS-CoV-2 target nucleic acids are DETECTED. SARS-CoV-2 RNA is generally detectable in upper respiratory specimens  during the acute phase of infection. Positive results are indicative of the presence of the identified virus, but do not rule out bacterial infection or co-infection with other pathogens not detected by the test. Clinical correlation with patient history and other diagnostic information is necessary to determine patient infection status. The expected result is Negative. Fact Sheet for Patients:  PinkCheek.be Fact Sheet for Healthcare Providers: GravelBags.it This test is not yet approved or cleared by the [place] FDA and  has been authorized for detection and/or diagnosis of SARS-CoV-2 by FDA under an Emergency Use Authorization (EUA).  This EUA will remain in effect (meaning this test can be used) fo r the duration of  the COVID-19 declaration under Section 564(b)(1) of the Act, 21 U.S.C. section 360bbb-3(b)(1), unless the authorization is terminated or revoked sooner.    Influenza A by PCR NEGATIVE NEGATIVE Final   Influenza B by PCR NEGATIVE NEGATIVE Final    Comment: (NOTE) The Xpert Xpress  SARS-CoV-2/FLU/RSV assay is intended as an aid in  the diagnosis of influenza from Nasopharyngeal swab specimens and  should not be used as a sole basis for treatment. Nasal washings and  aspirates are unacceptable for Xpert Xpress SARS-CoV-2/FLU/RSV  testing. Fact Sheet for Patients: PinkCheek.be Fact Sheet for Healthcare Providers: GravelBags.it This test is not yet approved or cleared by the [place] FDA and  has been authorized for detection and/or diagnosis of SARS-CoV-2 by  FDA under an Emergency Use Authorization (EUA). This EUA will remain  in effect (meaning this test can be used) for the duration of the  Covid-19 declaration under Section 564(b)(1) of the Act, 21  U.S.C. section 360bbb-3(b)(1), unless the authorization is  terminated or revoked. Performed at [facility], [place] [address] [place], [place] [address]   Culture, respiratory (non-expectorated)     Status: None   Collection Time: 04/17/19  5:05 PM   Specimen: Tracheal Aspirate; Respiratory  Result Value Ref Range Status   Specimen Description TRACHEAL ASPIRATE  Final   Special Requests   Final    NONE  Performed at [facility], [place] [address], [place], [place] [address]    Gram Stain NO WBC SEEN NO ORGANISMS SEEN   Final   Culture RARE STAPHYLOCOCCUS AUREUS  Final   Report Status 04/20/2019 FINAL  Final   Organism ID, Bacteria STAPHYLOCOCCUS AUREUS  Final      Susceptibility   Staphylococcus aureus - MIC*    CIPROFLOXACIN <=0.5 SENSITIVE Sensitive     ERYTHROMYCIN <=0.25 SENSITIVE Sensitive     GENTAMICIN <=0.5 SENSITIVE Sensitive     OXACILLIN 0.5 SENSITIVE Sensitive     TETRACYCLINE <=1 SENSITIVE Sensitive     VANCOMYCIN <=0.5 SENSITIVE Sensitive     TRIMETH/SULFA <=10 SENSITIVE Sensitive     CLINDAMYCIN <=0.25 SENSITIVE Sensitive     RIFAMPIN <=0.5 SENSITIVE Sensitive     Inducible Clindamycin NEGATIVE Sensitive     * RARE  STAPHYLOCOCCUS AUREUS  MRSA PCR Screening     Status: None   Collection Time: 04/20/19  9:24 AM   Specimen: Nasal Mucosa; Nasopharyngeal  Result Value Ref Range Status   MRSA by PCR NEGATIVE NEGATIVE Final    Comment:        The GeneXpert MRSA Assay (FDA approved for NASAL specimens only), is one component of a comprehensive MRSA colonization surveillance program. It is not intended to diagnose MRSA infection nor to guide or monitor treatment for MRSA infections. Performed at [facility], [place] [address] [place], [place] [address]     Radiology Reports CT ANGIO HEAD W OR WO CONTRAST  Result Date: 04/17/2019 CLINICAL DATA:  Stroke, follow-up. EXAM: CT ANGIOGRAPHY HEAD AND NECK CT PERFUSION BRAIN TECHNIQUE: Multidetector CT imaging of the head and neck was performed using the standard protocol during bolus administration of intravenous contrast. Multiplanar CT image reconstructions and MIPs were obtained to evaluate the vascular anatomy. Carotid stenosis measurements (when applicable) are obtained utilizing NASCET criteria, using the distal internal carotid diameter as the denominator. Multiphase CT imaging of the brain was performed following IV bolus contrast injection. Subsequent parametric perfusion maps were calculated using RAPID software. CONTRAST:  160m OMNIPAQUE IOHEXOL 350 MG/ML SOLN COMPARISON:  Noncontrast head CT performed earlier the same day. FINDINGS: CTA NECK FINDINGS Aortic arch: The origins of the innominate and left common carotid arteries are incompletely imaged. The visualized aortic arch is unremarkable. No significant innominate or proximal subclavian stenosis visualized. Right carotid system: CCA patent to the bifurcation without stenosis. Prominent predominantly calcified plaque within the distal common carotid artery and proximal ICA. There is likely greater than 70% stenosis at the origin of the ICA, although exact quantification of stenosis is difficult due  to irregularity of calcified plaque. Distal to this the ICA is patent within the neck without stenosis. Left carotid system: CCA patent to the bifurcation without stenosis. Calcified plaque within the distal common carotid artery, carotid bifurcation and proximal ICA. Resultant 30-40% narrowing of the proximal ICA as compared to the more distal vessel. Distal to this the ICA is patent within the neck without stenosis. Vertebral arteries: Right vertebral artery dominant. 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 Calcified plaque at the origin of the medial V1 left vertebral artery branch with at least moderate ostial stenosis. More distally the bilateral vertebral arteries are patent within the neck without stenosis. Skeleton: No acute bony abnormality. Cervical spondylosis with C5-C6 and C6-C7 posterior disc osteophytes. Other neck: No neck mass or cervical adenopathy. Upper chest: Paraseptal emphysema within the imaged lung apices. Also within the imaged lung  apices but there is interstitial prominence suspicious for interstitial lung disease. There are more nodular opacities within the right upper lobe measuring up to 1.4 cm (series 7, image 56) (series 7, image 99). Review of the MIP images confirms the above findings CTA HEAD FINDINGS Anterior circulation: The intracranial internal carotid arteries are patent with scattered calcified plaque. No more than mild stenosis within these vessels. 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 There is minimal reconstitution of flow seen within M2 and more distal right MCA branch vessels. The left middle cerebral artery is patent without high-grade proximal stenosis. The bilateral anterior cerebral arteries are patent without significant proximal stenosis. Posterior circulation: The intracranial vertebral arteries are patent without significant stenosis, as is the basilar artery. The bilateral posterior cerebral arteries are patent without significant  proximal stenosis. A sizable right posterior communicating artery is present. The a left posterior communicating artery is poorly delineated and may be hypoplastic or absent. Venous sinuses: Within limitations of contrast timing, no convincing thrombus. Anatomic variants: As described Review of the MIP images confirms the above findings CT Brain Perfusion Findings: ASPECTS: 4 CBF (<30%) Volume: 80m (in the right MCA vascular territory) Perfusion (Tmax>6.0s) volume: 147 mL (in the right MCA vascular territory) Mismatch Volume: 1374mInfarction Location:Right MCA vascular territory Findings of a proximal M1 right MCA occlusion and the perfusion results were discussed by telephone at the time of interpretation on 04/17/2019 at 10:38 am to provider Dr. ArLorraine Laxwho verbally acknowledged these results. IMPRESSION: CTA neck: 1. The common and internal carotid arteries are patent within the neck bilaterally. 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 There is likely greater than 70% stenosis at the origin of the right ICA. 30-40% stenosis of the proximal left ICA. 2. The vertebral arteries are patent within the neck bilaterally. The right vertebral artery is dominant with moderate atherosclerotic narrowing at its origin. Duplicated V1 left vertebral artery. There is at least moderate ostial stenosis at the origin of the medial left V1 vertebral artery branch. 3. The partially imaged lung apices demonstrate findings suspicious for interstitial lung disease. Additionally, there are nodular opacities within the right lung apex measuring up to 1.4 cm. Dedicated chest CT is recommended for further evaluation when clinically feasible. CTA head: 1. 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 There is minimal reconstitution of flow within M2 and more distal right MCA branch vessels. 2. No other intracranial large vessel occlusion or proximal high-grade arterial stenosis identified. CT perfusion  head: The perfusion software identifies a 10 mL core infarct within the right MCA vascular territory. However, please note more extensive infarction changes were appreciated on concurrently performed noncontrast head CT. The perfusion software detects a 147 mL region of hypoperfused parenchyma within the right MCA vascular territory. Reported mismatch 137 mL. Electronically Signed   By: KyKellie SimmeringO   On: 04/17/2019 11:04   CT HEAD WO CONTRAST  Result Date: 04/17/2019 CLINICAL DATA:  Follow-up intervention for right MCA occlusion. EXAM: CT HEAD WITHOUT CONTRAST TECHNIQUE: Contiguous axial images were obtained from the base of the skull through the vertex without intravenous contrast. COMPARISON:  Interventional images same day.  Head CT same day. FINDINGS: Brain: In the region the small core infarction in the lateral temporal lobe and the penumbra of the majority of the right middle cerebral artery cortical territory, there is brain swelling with effacement of the sulci and areas of patchy low-density as well as some areas of  increased attenuation. Low-density present within the right basal ganglia. The appearance of the increased attenuation is more consistent with post procedural contrast staining than intraparenchymal hemorrhage. Hemorrhage is not excluded but not favored. Certainly, there is no frank hematoma. No evidence of midline shift. No hydrocephalus. No extra-axial collection. Vascular: No acute vascular finding primarily. Skull: Negative Sinuses/Orbits: Clear except for mild mucosal thickening. Other: None IMPRESSION: Ischemic changes in the right MCA territory affecting the cortical brain, subcortical brain and right basal ganglia. Areas of low-density as well as areas of indistinct higher density favored to represent contrast staining rather than frank hemorrhage. Low level petechial bleeding is not excluded. There is no discrete hematoma. No mass effect or shift, despite mild-to-moderate  swelling in the region of affected brain. Electronically Signed   By: Nelson Chimes M.D.   On: 04/17/2019 20:35   CT ANGIO NECK W OR WO CONTRAST  Result Date: 04/17/2019 CLINICAL DATA:  Stroke, follow-up. EXAM: CT ANGIOGRAPHY HEAD AND NECK CT PERFUSION BRAIN TECHNIQUE: Multidetector CT imaging of the head and neck was performed using the standard protocol during bolus administration of intravenous contrast. Multiplanar CT image reconstructions and MIPs were obtained to evaluate the vascular anatomy. Carotid stenosis measurements (when applicable) are obtained utilizing NASCET criteria, using the distal internal carotid diameter as the denominator. Multiphase CT imaging of the brain was performed following IV bolus contrast injection. Subsequent parametric perfusion maps were calculated using RAPID software. CONTRAST:  153m OMNIPAQUE IOHEXOL 350 MG/ML SOLN COMPARISON:  Noncontrast head CT performed earlier the same day. FINDINGS: CTA NECK FINDINGS Aortic arch: The origins of the innominate and left common carotid arteries are incompletely imaged. The visualized aortic arch is unremarkable. No significant innominate or proximal subclavian stenosis visualized. Right carotid system: CCA patent to the bifurcation without stenosis. Prominent predominantly calcified plaque within the distal common carotid artery and proximal ICA. There is likely greater than 70% stenosis at the origin of the ICA, although exact quantification of stenosis is difficult due to irregularity of calcified plaque. Distal to this the ICA is patent within the neck without stenosis. Left carotid system: CCA patent to the bifurcation without stenosis. Calcified plaque within the distal common carotid artery, carotid bifurcation and proximal ICA. Resultant 30-40% narrowing of the proximal ICA as compared to the more distal vessel. Distal to this the ICA is patent within the neck without stenosis. Vertebral arteries: Right vertebral artery  dominant. 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 Calcified plaque at the origin of the medial V1 left vertebral artery branch with at least moderate ostial stenosis. More distally the bilateral vertebral arteries are patent within the neck without stenosis. Skeleton: No acute bony abnormality. Cervical spondylosis with C5-C6 and C6-C7 posterior disc osteophytes. Other neck: No neck mass or cervical adenopathy. Upper chest: Paraseptal emphysema within the imaged lung apices. Also within the imaged lung apices but there is interstitial prominence suspicious for interstitial lung disease. There are more nodular opacities within the right upper lobe measuring up to 1.4 cm (series 7, image 56) (series 7, image 99). Review of the MIP images confirms the above findings CTA HEAD FINDINGS Anterior circulation: The intracranial internal carotid arteries are patent with scattered calcified plaque. No more than mild stenosis within these vessels. 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 There is minimal reconstitution of flow seen within M2 and more distal right MCA branch vessels. The left middle cerebral artery is patent without high-grade proximal stenosis. The bilateral anterior cerebral arteries are  patent without significant proximal stenosis. Posterior circulation: The intracranial vertebral arteries are patent without significant stenosis, as is the basilar artery. The bilateral posterior cerebral arteries are patent without significant proximal stenosis. A sizable right posterior communicating artery is present. The a left posterior communicating artery is poorly delineated and may be hypoplastic or absent. Venous sinuses: Within limitations of contrast timing, no convincing thrombus. Anatomic variants: As described Review of the MIP images confirms the above findings CT Brain Perfusion Findings: ASPECTS: 4 CBF (<30%) Volume: 42m (in the right MCA vascular territory) Perfusion (Tmax>6.0s) volume:  147 mL (in the right MCA vascular territory) Mismatch Volume: 1366mInfarction Location:Right MCA vascular territory Findings of a proximal M1 right MCA occlusion and the perfusion results were discussed by telephone at the time of interpretation on 04/17/2019 at 10:38 am to provider Dr. ArLorraine Laxwho verbally acknowledged these results. IMPRESSION: CTA neck: 1. The common and internal carotid arteries are patent within the neck bilaterally. 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 There is likely greater than 70% stenosis at the origin of the right ICA. 30-40% stenosis of the proximal left ICA. 2. The vertebral arteries are patent within the neck bilaterally. The right vertebral artery is dominant with moderate atherosclerotic narrowing at its origin. Duplicated V1 left vertebral artery. There is at least moderate ostial stenosis at the origin of the medial left V1 vertebral artery branch. 3. The partially imaged lung apices demonstrate findings suspicious for interstitial lung disease. Additionally, there are nodular opacities within the right lung apex measuring up to 1.4 cm. Dedicated chest CT is recommended for further evaluation when clinically feasible. CTA head: 1. 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 There is minimal reconstitution of flow within M2 and more distal right MCA branch vessels. 2. No other intracranial large vessel occlusion or proximal high-grade arterial stenosis identified. CT perfusion head: The perfusion software identifies a 10 mL core infarct within the right MCA vascular territory. However, please note more extensive infarction changes were appreciated on concurrently performed noncontrast head CT. The perfusion software detects a 147 mL region of hypoperfused parenchyma within the right MCA vascular territory. Reported mismatch 137 mL. Electronically Signed   By: KyKellie SimmeringO   On: 04/17/2019 11:04   MR ANGIO HEAD WO CONTRAST  Result  Date: 04/19/2019 CLINICAL DATA:  Follow-up right MCA occlusion with intervention. EXAM: MRI HEAD WITHOUT CONTRAST MRA HEAD WITHOUT CONTRAST TECHNIQUE: Multiplanar, multiecho pulse sequences of the brain and surrounding structures were obtained without intravenous contrast. Angiographic images of the head were obtained using MRA technique without contrast. 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 Differences in diffusion appearance is attributed to artifact from petechial hemorrhage. There is progressive swelling with partial effacement of the right lateral ventricle. Midline shift only measures 2 mm. No entrapment. Confluent petechial hemorrhage is seen in the infarcted area. There are a few punctate acute infarcts in the left occipital parietal region, also seen prior. No extra-axial collection. Vascular: Arterial findings below. Skull and upper cervical spine: Negative Sinuses/Orbits: Negative MRA HEAD FINDINGS Motion degraded. Symmetric carotid and vertebral arteries. Dominant left PICA and right AICA. The vertebral, carotid, and basilar arteries are smooth and widely patent. No branch occlusion or aneurysm. Accounting for motion there is symmetric flow in the right MCA. IMPRESSION: 1. Unchanged extent of large  acute infarct in the right MCA territory with confluent petechial hemorrhage. There is progressive swelling with up to 2 mm of midline shift. 2. Redemonstrated minimal acute infarct in left hemispheric white matter. 3. Negative motion degraded MRA.  The right MCA is patent. Electronically Signed   By: Monte Fantasia M.D.   On: 04/19/2019 11:50   MR BRAIN WO CONTRAST  Result Date: 04/19/2019 CLINICAL DATA:  Follow-up right MCA occlusion with intervention. EXAM: MRI HEAD WITHOUT CONTRAST MRA HEAD WITHOUT CONTRAST TECHNIQUE: Multiplanar,  multiecho pulse sequences of the brain and surrounding structures were obtained without intravenous contrast. Angiographic images of the head were obtained using MRA technique without contrast. 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 Differences in diffusion appearance is attributed to artifact from petechial hemorrhage. There is progressive swelling with partial effacement of the right lateral ventricle. Midline shift only measures 2 mm. No entrapment. Confluent petechial hemorrhage is seen in the infarcted area. There are a few punctate acute infarcts in the left occipital parietal region, also seen prior. No extra-axial collection. Vascular: Arterial findings below. Skull and upper cervical spine: Negative Sinuses/Orbits: Negative MRA HEAD FINDINGS Motion degraded. Symmetric carotid and vertebral arteries. Dominant left PICA and right AICA. The vertebral, carotid, and basilar arteries are smooth and widely patent. No branch occlusion or aneurysm. Accounting for motion there is symmetric flow in the right MCA. IMPRESSION: 1. 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 There is progressive swelling with up to 2 mm of midline shift. 2. Redemonstrated minimal acute infarct in left hemispheric white matter. 3. Negative motion degraded MRA.  The right MCA is patent. Electronically Signed   By: Monte Fantasia M.D.   On: 04/19/2019 11:50   MR BRAIN WO CONTRAST  Result Date: 04/17/2019 CLINICAL DATA:  Focal neuro deficit, greater than 6 hours, stroke suspected. EXAM: MRI HEAD WITHOUT CONTRAST TECHNIQUE: Multiplanar, multiecho pulse sequences of the brain and surrounding structures were obtained without intravenous contrast. COMPARISON:  Noncontrast CT head, CT angiogram head/neck and CT  perfusion performed earlier the same day 04/17/2019 FINDINGS: Brain: A limited protocol brain MRI was performed at the ordering provider's request consisting of only axial diffusion-weighted imaging and axial T2 FLAIR imaging. There is extensive multifocal restricted diffusion within the right MCA vascular territory, consistent with acute ischemic changes and involving portions of the right frontal, parietal, temporal and occipital lobes as well as right insular cortex, posterior limb of right internal capsule, right lentiform nucleus, right caudate body and right corona radiata. Of note, this includes acute ischemic change within the right motor strip. The largest region of diffusion weighted signal abnormality within the right temporal lobe measures 7.8 x 3.0 cm in transaxial dimensions (series 4, image 23). There is T2/FLAIR hyperintensity which corresponds with some, but not all, of the acute ischemic change. For instance, there is little to no T2/FLAIR hyperintensity corresponding with the ischemic changes within the right corona radiata, basal ganglia and right insula at this time. Punctate acute cortical infarct within the left parietooccipital lobe (series 4, image 33). Additional probable punctate acute infarcts more superiorly within the left parietal lobe (series 4, image 35) and within the lateral left occipital lobe (series 4, image 24). Vascular: Poorly assessed on the acquired sequences. Skull and upper cervical spine: No evidence of focal  marrow lesion on the acquired sequences. Sinuses/Orbits: Visualized orbits demonstrate no acute abnormality. Minimal ethmoid sinus mucosal thickening. No significant mastoid effusion. IMPRESSION: Extensive restricted diffusion consistent with acute ischemia involving the right MCA vascular territory. There is T2/FLAIR hyperintensity which corresponds with some, but not all, of the acute ischemic changes as described. Additional punctate acute infarcts within the  left parietal and occipital lobes. Electronically Signed   By: Kellie Simmering DO   On: 04/17/2019 12:50   IR CT Head Ltd  Result Date: 04/17/2019 INDICATION: New onset of left-sided weakness with right gaze deviation and dysarthria.  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  EXAM: 1. EMERGENT LARGE VESSEL OCCLUSION THROMBOLYSIS (anterior CIRCULATION)  COMPARISON:  CT angiogram of the head and neck of April 17, 2019.  MEDICATIONS: Ancef 2 g IV antibiotic was administered within 1 hour of the procedure.  ANESTHESIA/SEDATION: General anesthesia.  CONTRAST:  Isovue 300 approximately 65 mL.  FLUOROSCOPY TIME:  Fluoroscopy Time: 41 minutes 18 seconds (1531 mGy).  COMPLICATIONS: None immediate.  TECHNIQUE: Following a full explanation of the procedure along with the potential associated complications, an informed witnessed consent was obtained from the patient's wife and daughter. The risks of intracranial hemorrhage of 10%, worsening neurological deficit, ventilator dependency, death and inability to revascularize were all reviewed in detail with the patient's wife and daughter.  The patient was then put under general anesthesia by the Department of Anesthesiology at [facility].  The right groin was prepped and draped in the usual sterile fashion. Thereafter using modified Seldinger technique, transfemoral access into the right common femoral artery was obtained without difficulty. Over a 0.035 inch guidewire an 8 [place] Pinnacle sheath was inserted. Through this, and also over a 0.035 inch guidewire a 5 [place] JB 1 catheter was advanced to the aortic arch region and selectively positioned in the innominate artery and the right common carotid artery.  FINDINGS: The innominate artery arteriogram demonstrates the origin of the right subclavian artery and the right common carotid artery to be widely patent.  The right common carotid arteriogram demonstrates  the right external carotid artery and its major branches to be widely patent.  The right internal carotid artery at the bulb to the cranial skull base demonstrates wide patency with mild FMD-like changes involving the mid cervical right ICA.  The petrous, cavernous and the supraclinoid segments are widely patent.  A right posterior communicating artery is seen opacifying the right posterior cerebral distribution.  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 The right anterior cerebral artery opacifies into the capillary and venous phases.  PROCEDURE: The diagnostic JB 1 catheter in right common carotid artery was exchanged over an 8 [place] 300 cm Rosen exchange guidewire for an 087 balloon guide catheter which had been prepped with 50% contrast and 50% heparinized saline infusion. This was advanced to the right common carotid artery just proximal to the origin of the right internal carotid artery. The guidewire was removed. Good aspiration was obtained from the hub of the balloon guide catheter.  Gentle contrast injection demonstrated no evidence of spasms or of intraluminal filling defects. Over a 0.014 inch standard Synchro micro guidewire with a J configuration, the combination of the 021 Trevo ProVue microcatheter inside of a 6 [place] Catalyst 132 cm guide catheter was advanced to the distal end of the balloon guide catheter.  With the micro guidewire leading with a J-tip configuration to avoid dissections or  inducing spasm, the combination was navigated without difficulty to the supraclinoid right ICA.  The occluded right middle cerebral artery was entered with a micro guidewire which was advanced to the M3 regions of the inferior division of the right middle cerebral artery followed by the microcatheter. The guidewire was removed. Good aspiration was obtained from the hub of the microcatheter. Gentle contrast injection demonstrated safe position of tip of the  microcatheter. This was then connected to continuous heparinized saline infusion.  A 4 mm x 40 mm Solitaire X retrieval device was then advanced to the distal end of the microcatheter. The O ring on the delivery microcatheter was then loosened. With slight forward gentle traction with the right hand on the delivery micro guidewire with left hand the delivery microcatheter was retrieved unsheathing the retrieval device.  The Catalyst 6 [place] guide catheter was advanced to the occluded right middle cerebral artery. With proximal flow arrest obtained by inflating the balloon in the right internal carotid artery, and after constant aspiration using a Penumbra aspiration device at the hub of the 6 [place] Catalyst guide catheter, and with a 60 mL syringe at the hub of the balloon guide catheter, the combination of the retrieval device, the microcatheter, and the 6 [place] Catalyst guide were retrieved and removed as constant aspiration was applied at the hub of the balloon guide catheter.  Proximal flow arrest was reversed. A control arteriogram performed through the balloon guide catheter which had been advanced into the mid right internal carotid artery demonstrated complete opacification of the right middle cerebral artery and the inferior division. However, the superior division remained occluded.  A second pass was then made again using the above combination. With the micro guidewire leading, access into the M3 region of the superior division was obtained with a microcatheter. The micro guidewire was removed. Good aspiration was established at the hub of the microcatheter. A gentle control arteriogram again demonstrates safe positioning of the tip of the microcatheter which was then connected to continuous heparinized saline infusion. Again with constant flow arrest in the right internal carotid artery, and aspiration at the hub of the Catalyst guide catheter using the Penumbra aspiration device at the origin of  the superior division for 2 minutes, and with a 60 mL syringe at the hub of the balloon guide catheter, the combination of the retrieval device, the microcatheter and the 6 [place] Catalyst guide catheter were retrieved and removed. Following reversal of flow arrest, control arteriogram performed through the balloon guide catheter in the right internal carotid artery now demonstrated revascularization of the superior division and also further improved caliber of the inferior division.  There continued to be a very small branch extending into the anterior temporal region which was occluded in the M2 region. Also noted was a distal M3 M4 junction branch occlusion of a parietal branch of the inferior division.  A TICI 2C revascularization had been achieved.  The right anterior cerebral artery remained widely patent with cross-filling via the anterior communicating artery of the left anterior cerebral A2 segment and distally.  The posterior communicating artery remain widely patent.  Moderate spasm in the distal cervical right ICA, and the superior division of the right middle cerebral artery responded to 2 aliquots of 25 mcg of nitroglycerin intra-arterially.  A final control arteriogram performed through the balloon guide catheter in the right common carotid artery demonstrated significantly improved caliber of the right internal carotid artery, with maintenance of a TICI 2c revascularization of the right  middle cerebral artery distribution.  The balloon guide catheter was removed. The right groin 8 [place] Pinnacle sheath was then removed with manual compression in the right groin. Distal pulses remained Dopplerable in the dorsalis pedis, and the posterior tibial regions bilaterally unchanged.  A flat panel CT of the brain demonstrated heterogeneous hyper attenuation in the right temporal lobe region anteriorly probably representing contrast stain versus less likely hemorrhagic conversion of infarct tissue.   There was no mass-effect noted on the right lateral ventricle. Patient was then transferred to the ICU unit for post thrombectomy management. Patient was left intubated on account of his COVID status.  IMPRESSION: Endovascular near complete revascularization of the right middle cerebral artery territory with 2 passes with the 4 mm x 40 mm Solitaire X retrieval device and Penumbra aspiration achieving a TICI 2c revascularization.  PLAN: Follow-up as per referring MD.   Electronically Signed   By: Luanne Bras M.D.   On: 04/18/2019 10:42   CT CEREBRAL PERFUSION W CONTRAST  Result Date: 04/17/2019 CLINICAL DATA:  Stroke, follow-up. EXAM: CT ANGIOGRAPHY HEAD AND NECK CT PERFUSION BRAIN TECHNIQUE: Multidetector CT imaging of the head and neck was performed using the standard protocol during bolus administration of intravenous contrast. Multiplanar CT image reconstructions and MIPs were obtained to evaluate the vascular anatomy. Carotid stenosis measurements (when applicable) are obtained utilizing NASCET criteria, using the distal internal carotid diameter as the denominator. Multiphase CT imaging of the brain was performed following IV bolus contrast injection. Subsequent parametric perfusion maps were calculated using RAPID software. CONTRAST:  16m OMNIPAQUE IOHEXOL 350 MG/ML SOLN COMPARISON:  Noncontrast head CT performed earlier the same day. FINDINGS: CTA NECK FINDINGS Aortic arch: The origins of the innominate and left common carotid arteries are incompletely imaged. The visualized aortic arch is unremarkable. No significant innominate or proximal subclavian stenosis visualized. Right carotid system: CCA patent to the bifurcation without stenosis. Prominent predominantly calcified plaque within the distal common carotid artery and proximal ICA. There is likely greater than 70% stenosis at the origin of the ICA, although exact quantification of stenosis is difficult due to irregularity of calcified  plaque. Distal to this the ICA is patent within the neck without stenosis. Left carotid system: CCA patent to the bifurcation without stenosis. Calcified plaque within the distal common carotid artery, carotid bifurcation and proximal ICA. Resultant 30-40% narrowing of the proximal ICA as compared to the more distal vessel. Distal to this the ICA is patent within the neck without stenosis. Vertebral arteries: Right vertebral artery dominant. 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 Calcified plaque at the origin of the medial V1 left vertebral artery branch with at least moderate ostial stenosis. More distally the bilateral vertebral arteries are patent within the neck without stenosis. Skeleton: No acute bony abnormality. Cervical spondylosis with C5-C6 and C6-C7 posterior disc osteophytes. Other neck: No neck mass or cervical adenopathy. Upper chest: Paraseptal emphysema within the imaged lung apices. Also within the imaged lung apices but there is interstitial prominence suspicious for interstitial lung disease. There are more nodular opacities within the right upper lobe measuring up to 1.4 cm (series 7, image 56) (series 7, image 99). Review of the MIP images confirms the above findings CTA HEAD FINDINGS Anterior circulation: The intracranial internal carotid arteries are patent with scattered calcified plaque. No more than mild stenosis within these vessels. 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 There is minimal reconstitution of flow seen within M2 and more distal right MCA  branch vessels. The left middle cerebral artery is patent without high-grade proximal stenosis. The bilateral anterior cerebral arteries are patent without significant proximal stenosis. Posterior circulation: The intracranial vertebral arteries are patent without significant stenosis, as is the basilar artery. The bilateral posterior cerebral arteries are patent without significant proximal stenosis. A sizable right  posterior communicating artery is present. The a left posterior communicating artery is poorly delineated and may be hypoplastic or absent. Venous sinuses: Within limitations of contrast timing, no convincing thrombus. Anatomic variants: As described Review of the MIP images confirms the above findings CT Brain Perfusion Findings: ASPECTS: 4 CBF (<30%) Volume: 21m (in the right MCA vascular territory) Perfusion (Tmax>6.0s) volume: 147 mL (in the right MCA vascular territory) Mismatch Volume: 1348mInfarction Location:Right MCA vascular territory Findings of a proximal M1 right MCA occlusion and the perfusion results were discussed by telephone at the time of interpretation on 04/17/2019 at 10:38 am to provider Dr. ArLorraine Laxwho verbally acknowledged these results. IMPRESSION: CTA neck: 1. The common and internal carotid arteries are patent within the neck bilaterally. 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 There is likely greater than 70% stenosis at the origin of the right ICA. 30-40% stenosis of the proximal left ICA. 2. The vertebral arteries are patent within the neck bilaterally. The right vertebral artery is dominant with moderate atherosclerotic narrowing at its origin. Duplicated V1 left vertebral artery. There is at least moderate ostial stenosis at the origin of the medial left V1 vertebral artery branch. 3. The partially imaged lung apices demonstrate findings suspicious for interstitial lung disease. Additionally, there are nodular opacities within the right lung apex measuring up to 1.4 cm. Dedicated chest CT is recommended for further evaluation when clinically feasible. CTA head: 1. 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 There is minimal reconstitution of flow within M2 and more distal right MCA branch vessels. 2. No other intracranial large vessel occlusion or proximal high-grade arterial stenosis identified. CT perfusion head: The perfusion software  identifies a 10 mL core infarct within the right MCA vascular territory. However, please note more extensive infarction changes were appreciated on concurrently performed noncontrast head CT. The perfusion software detects a 147 mL region of hypoperfused parenchyma within the right MCA vascular territory. Reported mismatch 137 mL. Electronically Signed   By: KyKellie SimmeringO   On: 04/17/2019 11:04   DG Chest Port 1 View  Result Date: 04/21/2019 CLINICAL DATA:  Shortness of breath. EXAM: PORTABLE CHEST 1 VIEW COMPARISON:  04/18/2019 FINDINGS: The endotracheal tube has been removed. The patient is rotated to the right with grossly unchanged cardiomediastinal silhouette. Lung volumes are lower than on the prior study with increased elevation of the right hemidiaphragm. Mid and lower lung airspace opacities bilaterally have slightly increased in the right base and slightly improved in the left base. No sizable pleural effusion or pneumothorax is identified. IMPRESSION: Interval extubation. Lower lung volumes with mildly worsened aeration in the right lung base and mildly improved aeration in the left lung base. Electronically Signed   By: AlLogan Bores.D.   On: 04/21/2019 08:54   DG Chest Port 1 View  Result Date: 04/18/2019 CLINICAL DATA:  Acute respiratory failure EXAM: PORTABLE CHEST 1 VIEW COMPARISON:  Radiograph 04/17/2019 FINDINGS: *Endotracheal tube in the mid trachea, 5.5 cm from the carina. *Telemetry leads overlie the chest. Slight improvement in the bilateral airspace disease most pronounced in the left base and periphery and right mid lung. No new areas  of airspace disease. No pneumothorax or visible effusion the portion of the costophrenic sulci on the left is collimated. Cardiomediastinal contours are stable with a calcified aorta. No acute osseous or soft tissue abnormality. IMPRESSION: Stable satisfactory positioning of the endotracheal tube. Slight interval improvement of bilateral airspace  disease. Electronically Signed   By: Lovena Le M.D.   On: 04/18/2019 06:15   DG Chest Port 1 View  Result Date: 04/17/2019 CLINICAL DATA:  COVID-19. Intubation. EXAM: PORTABLE CHEST 1 VIEW COMPARISON:  Radiograph yesterday. FINDINGS: Endotracheal tube tip 5.8 cm from the carina. Probable esophageal temperature probe with tip at the level of the clavicular heads. No definite enteric tube is visualized. Patchy bilateral airspace disease within both lungs, slight worsening in the right midlung zone from prior. Unchanged heart size and mediastinal contours. No pneumothorax or large pleural effusion. No acute osseous abnormalities are seen. IMPRESSION: 1. Bilateral pneumonia, with slight worsening in the right mid lung since yesterday. 2. Endotracheal tube tip 5.8 cm from the carina. Probable esophageal temperature probe with tip at the clavicular heads. Electronically Signed   By: Keith Rake M.D.   On: 04/17/2019 16:45   DG Chest Portable 1 View  Result Date: 04/16/2019 CLINICAL DATA:  Cough and fever. New weakness. EXAM: PORTABLE CHEST 1 VIEW COMPARISON:  08/11/2009 FINDINGS: Normal sized heart. Interval diffuse prominence of the interstitial markings and patchy opacities in the mid and lower lung zones bilaterally. No visible pleural fluid. Diffuse osteo IMPRESSION: Interval probable bilateral pneumonia and pneumonitis. Electronically Signed   By: Claudie Revering M.D.   On: 04/16/2019 14:28   IR PERCUTANEOUS ART THROMBECTOMY/INFUSION INTRACRANIAL INC DIAG ANGIO  Result Date: 04/19/2019 INDICATION: New onset of left-sided weakness with right gaze deviation and dysarthria. 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 EXAM: 1. EMERGENT LARGE VESSEL OCCLUSION THROMBOLYSIS (anterior CIRCULATION) COMPARISON:  CT angiogram of the head and neck of April 17, 2019. MEDICATIONS: Ancef 2 g IV antibiotic was administered within 1 hour of the procedure. ANESTHESIA/SEDATION:  General anesthesia. CONTRAST:  Isovue 300 approximately 65 mL. FLUOROSCOPY TIME:  Fluoroscopy Time: 41 minutes 18 seconds (1531 mGy). COMPLICATIONS: None immediate. TECHNIQUE: Following a full explanation of the procedure along with the potential associated complications, an informed witnessed consent was obtained from the patient's wife and daughter. The risks of intracranial hemorrhage of 10%, worsening neurological deficit, ventilator dependency, death and inability to revascularize were all reviewed in detail with the patient's wife and daughter. The patient was then put under general anesthesia by the Department of Anesthesiology at [facility]. The right groin was prepped and draped in the usual sterile fashion. Thereafter using modified Seldinger technique, transfemoral access into the right common femoral artery was obtained without difficulty. Over a 0.035 inch guidewire an 8 [place] Pinnacle sheath was inserted. Through this, and also over a 0.035 inch guidewire a 5 [place] JB 1 catheter was advanced to the aortic arch region and selectively positioned in the innominate artery and the right common carotid artery. FINDINGS: The innominate artery arteriogram demonstrates the origin of the right subclavian artery and the right common carotid artery to be widely patent. The right common carotid arteriogram demonstrates the right external carotid artery and its major branches to be widely patent. The right internal carotid artery at the bulb to the cranial skull base demonstrates wide patency with mild FMD-like changes involving the mid cervical right ICA. The petrous, cavernous and the supraclinoid segments are widely patent. A right posterior  communicating artery is seen opacifying the right posterior cerebral distribution. 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 The right anterior cerebral artery opacifies into the capillary and venous phases. PROCEDURE: The  diagnostic JB 1 catheter in right common carotid artery was exchanged over an 8 [place] 300 cm Rosen exchange guidewire for an 087 balloon guide catheter which had been prepped with 50% contrast and 50% heparinized saline infusion. This was advanced to the right common carotid artery just proximal to the origin of the right internal carotid artery. The guidewire was removed. Good aspiration was obtained from the hub of the balloon guide catheter. Gentle contrast injection demonstrated no evidence of spasms or of intraluminal filling defects. Over a 0.014 inch standard Synchro micro guidewire with a J configuration, the combination of the 021 Trevo ProVue microcatheter inside of a 6 [place] Catalyst 132 cm guide catheter was advanced to the distal end of the balloon guide catheter. With the micro guidewire leading with a J-tip configuration to avoid dissections or inducing spasm, the combination was navigated without difficulty to the supraclinoid right ICA. The occluded right middle cerebral artery was entered with a micro guidewire which was advanced to the M3 regions of the inferior division of the right middle cerebral artery followed by the microcatheter. The guidewire was removed. Good aspiration was obtained from the hub of the microcatheter. Gentle contrast injection demonstrated safe position of tip of the microcatheter. This was then connected to continuous heparinized saline infusion. A 4 mm x 40 mm Solitaire X retrieval device was then advanced to the distal end of the microcatheter. The O ring on the delivery microcatheter was then loosened. With slight forward gentle traction with the right hand on the delivery micro guidewire with left hand the delivery microcatheter was retrieved unsheathing the retrieval device. The Catalyst 6 [place] guide catheter was advanced to the occluded right middle cerebral artery. With proximal flow arrest obtained by inflating the balloon in the right internal carotid artery,  and after constant aspiration using a Penumbra aspiration device at the hub of the 6 [place] Catalyst guide catheter, and with a 60 mL syringe at the hub of the balloon guide catheter, the combination of the retrieval device, the microcatheter, and the 6 [place] Catalyst guide were retrieved and removed as constant aspiration was applied at the hub of the balloon guide catheter. Proximal flow arrest was reversed. A control arteriogram performed through the balloon guide catheter which had been advanced into the mid right internal carotid artery demonstrated complete opacification of the right middle cerebral artery and the inferior division. However, the superior division remained occluded. A second pass was then made again using the above combination. With the micro guidewire leading, access into the M3 region of the superior division was obtained with a microcatheter. The micro guidewire was removed. Good aspiration was established at the hub of the microcatheter. A gentle control arteriogram again demonstrates safe positioning of the tip of the microcatheter which was then connected to continuous heparinized saline infusion. Again with constant flow arrest in the right internal carotid artery, and aspiration at the hub of the Catalyst guide catheter using the Penumbra aspiration device at the origin of the superior division for 2 minutes, and with a 60 mL syringe at the hub of the balloon guide catheter, the combination of the retrieval device, the microcatheter and the 6 [place] Catalyst guide catheter were retrieved and removed. Following reversal of flow arrest, control arteriogram performed through  the balloon guide catheter in the right internal carotid artery now demonstrated revascularization of the superior division and also further improved caliber of the inferior division. There continued to be a very small branch extending into the anterior temporal region which was occluded in the M2 region. Also noted  was a distal M3 M4 junction branch occlusion of a parietal branch of the inferior division. A TICI 2C revascularization had been achieved. The right anterior cerebral artery remained widely patent with cross-filling via the anterior communicating artery of the left anterior cerebral A2 segment and distally. The posterior communicating artery remain widely patent. Moderate spasm in the distal cervical right ICA, and the superior division of the right middle cerebral artery responded to 2 aliquots of 25 mcg of nitroglycerin intra-arterially. A final control arteriogram performed through the balloon guide catheter in the right common carotid artery demonstrated significantly improved caliber of the right internal carotid artery, with maintenance of a TICI 2c revascularization of the right middle cerebral artery distribution. The balloon guide catheter was removed. The right groin 8 [place] Pinnacle sheath was then removed with manual compression in the right groin. Distal pulses remained Dopplerable in the dorsalis pedis, and the posterior tibial regions bilaterally unchanged. A flat panel CT of the brain demonstrated heterogeneous hyper attenuation in the right temporal lobe region anteriorly probably representing contrast stain versus less likely hemorrhagic conversion of infarct tissue. There was no mass-effect noted on the right lateral ventricle. Patient was then transferred to the ICU unit for post thrombectomy management. Patient was left intubated on account of his COVID status. IMPRESSION: Endovascular near complete revascularization of the right middle cerebral artery territory with 2 passes with the 4 mm x 40 mm Solitaire X retrieval device and Penumbra aspiration achieving a TICI 2c revascularization. PLAN: Follow-up as per referring MD. Electronically Signed   By: Luanne Bras M.D.   On: 04/18/2019 10:42   CT HEAD CODE STROKE WO CONTRAST  Result Date: 04/17/2019 CLINICAL DATA:  Code stroke.  Left-sided facial droop/weakness, last known well noon yesterday EXAM: CT HEAD WITHOUT CONTRAST TECHNIQUE: Contiguous axial images were obtained from the base of the skull through the vertex without intravenous contrast. COMPARISON:  No pertinent prior studies available for comparison. 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 Subtle petechial hemorrhage is questioned within a region of infarction within the anterior right frontal lobe (series 2, image 19). No significant mass effect or midline shift. No extra-axial fluid collection. Background mild ill-defined hypoattenuation within the cerebral white matter is nonspecific, but consistent with chronic small vessel ischemic disease. Mild generalized parenchymal atrophy. Vascular: Abnormal hyperdensity of the M1 right MCA likely reflecting thrombus. Atherosclerotic calcifications. Skull: No calvarial fracture or suspicious osseous lesion. Sinuses/Orbits: Visualized orbits demonstrate no acute abnormality. Mild paranasal sinus mucosal thickening. No significant mastoid effusion. ASPECTS ([place] Stroke Program Early CT Score) - Ganglionic level infarction (caudate, lentiform nuclei, internal capsule, insula, M1-M3 cortex): 4 (points subtracted for M2, M3 and insula) - Supraganglionic infarction (M4-M6 cortex): 0 Total score (0-10 with 10 being normal): 4 These results were communicated to Dr. Lorraine Lax At 10:38 amon 2/10/2021by text page via the [facility] messaging system. IMPRESSION: 1. 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 ASPECTS 4. Subtle petechial hemorrhage is questioned within an anterior right frontal lobe infarct site.  No significant mass effect. 2. Hyperdensity of the M1 right MCA likely  reflecting thrombus. 3. Mild generalized parenchymal atrophy and chronic small vessel ischemic disease. Electronically Signed   By: Kellie Simmering DO   On: 04/17/2019 10:38   VAS [place] LOWER EXTREMITY VENOUS (DVT)  Result Date: 04/18/2019  Lower Venous DVTStudy Indications: Elevated Ddimer.  Risk Factors: COVID 19 positive. Limitations: Bandages, open wound and patient positioning. Comparison Study: No prior studies. Performing Technologist: Oliver Hum RVT  Examination Guidelines: A complete evaluation includes B-mode imaging, spectral Doppler, color Doppler, and power Doppler as needed of all accessible portions of each vessel. Bilateral testing is considered an integral part of a complete examination. Limited examinations for reoccurring indications may be performed as noted. The reflux portion of the exam is performed with the patient in reverse Trendelenburg.  +---------+---------------+---------+-----------+----------+--------------+ RIGHT    CompressibilityPhasicitySpontaneityPropertiesThrombus Aging +---------+---------------+---------+-----------+----------+--------------+ CFV                                                   Not visualized +---------+---------------+---------+-----------+----------+--------------+ SFJ                                                   Not visualized +---------+---------------+---------+-----------+----------+--------------+ FV Prox  Full           Yes      Yes                                 +---------+---------------+---------+-----------+----------+--------------+ FV Mid   Full                                                        +---------+---------------+---------+-----------+----------+--------------+ FV DistalFull                                                        +---------+---------------+---------+-----------+----------+--------------+ PFV      Full                                                         +---------+---------------+---------+-----------+----------+--------------+ POP      Full           Yes      Yes                                 +---------+---------------+---------+-----------+----------+--------------+ PTV      Full                                                        +---------+---------------+---------+-----------+----------+--------------+  PERO     Full                                                        +---------+---------------+---------+-----------+----------+--------------+ Unable to visualize the CFV, SFJ due to bandages.  +---------+---------------+---------+-----------+----------+--------------+ LEFT     CompressibilityPhasicitySpontaneityPropertiesThrombus Aging +---------+---------------+---------+-----------+----------+--------------+ CFV      Full           Yes      Yes                                 +---------+---------------+---------+-----------+----------+--------------+ SFJ      Full                                                        +---------+---------------+---------+-----------+----------+--------------+ FV Prox  Full                                                        +---------+---------------+---------+-----------+----------+--------------+ FV Mid   Full                                                        +---------+---------------+---------+-----------+----------+--------------+ FV DistalFull                                                        +---------+---------------+---------+-----------+----------+--------------+ PFV      Full                                                        +---------+---------------+---------+-----------+----------+--------------+ POP      Full           Yes      Yes                                 +---------+---------------+---------+-----------+----------+--------------+ PTV      Full                                                         +---------+---------------+---------+-----------+----------+--------------+ PERO     Full                                                        +---------+---------------+---------+-----------+----------+--------------+  Gastroc  None                                         Acute          +---------+---------------+---------+-----------+----------+--------------+     Summary: RIGHT: - There is no evidence of deep vein thrombosis in the lower extremity.  - No cystic structure found in the popliteal fossa.  LEFT: - Findings consistent with acute deep vein thrombosis involving the left gastrocnemius veins. - No cystic structure found in the popliteal fossa.  *See table(s) above for measurements and observations. Electronically signed by Servando Snare MD on 04/18/2019 at 6:33:22 PM.    Final    ECHOCARDIOGRAM LIMITED  Result Date: 04/18/2019    ECHOCARDIOGRAM LIMITED REPORT   Patient Name:   Henry [name] Date of Exam: 04/17/2019 Medical Rec #:  [id number]       Height:       68.0 in Accession #:    [id number]      Weight:       180.0 lb Date of Birth:  [birth year]       BSA:          1.95 m Patient Age:    71 years        BP:           141/51 mmHg Patient Gender: M               HR:           57 bpm. Exam Location:  Inpatient Procedure: Limited Echo and Cardiac Doppler Indications:    Stroke 434.91 / I163.9  History:        Patient has no prior history of Echocardiogram examinations.                 Signs/Symptoms:Chest Pain; Risk Factors:Hypertension. Pneumonia                 due to COVID-19 virus.  Sonographer:    Jaquita Folds Referring Phys: [place]  1. Left ventricular ejection fraction, by estimation, is 55 to 60%. The left ventricle has normal function. The left ventrical has no regional wall motion abnormalities. Left ventricular diastolic parameters are indeterminate.  2. Right ventricular systolic function is normal. The right ventricular size is normal. There is  normal pulmonary artery systolic pressure.  3. The mitral valve is normal in structure and function. trivial mitral valve regurgitation. No evidence of mitral stenosis.  4. The aortic valve is normal in structure and function. Aortic valve regurgitation is not visualized. No aortic stenosis is present. FINDINGS  Left Ventricle: Left ventricular ejection fraction, by estimation, is 55 to 60%. The left ventricle has normal function. The left ventricle has no regional wall motion abnormalities. There is no left ventricular hypertrophy. Right Ventricle: The right ventricular size is normal. No increase in right ventricular wall thickness. Right ventricular systolic function is normal. There is normal pulmonary artery systolic pressure. The tricuspid regurgitant velocity is 1.86 m/s, and  with an assumed right atrial pressure of 3 mmHg, the estimated right ventricular systolic pressure is 88.5 mmHg. Mitral Valve: The mitral valve is normal in structure and function. Trivial mitral valve regurgitation. No evidence of mitral valve stenosis. Tricuspid Valve: The tricuspid valve is grossly normal. Tricuspid valve regurgitation is mild. Aortic Valve: The aortic valve is normal in structure and function.  Aortic valve regurgitation is not visualized. No aortic stenosis is present. Aorta: The aortic root, ascending aorta and aortic arch are all structurally normal, with no evidence of dilitation or obstruction.  LEFT VENTRICLE PLAX 2D LVIDd:         4.60 cm  Diastology LVIDs:         3.60 cm  LV e' lateral:   8.04 cm/s LV PW:         0.90 cm  LV E/e' lateral: 10.1 LV IVS:        0.90 cm  LV e' medial:    5.91 cm/s LVOT diam:     2.00 cm  LV E/e' medial:  13.8 LV SV:         55.61 ml LV SV Index:   21.50 LVOT Area:     3.14 cm  LEFT ATRIUM             Index LA diam:        4.30 cm 2.20 cm/m LA Vol (A2C):   50.7 ml 25.94 ml/m LA Vol (A4C):   43.8 ml 22.41 ml/m LA Biplane Vol: 48.7 ml 24.92 ml/m  AORTIC VALVE LVOT Vmax:    79.40 cm/s LVOT Vmean:  57.900 cm/s LVOT VTI:    0.177 m  AORTA Ao Root diam: 2.70 cm MITRAL VALVE                        TRICUSPID VALVE MV Area (PHT): 2.50 cm             TR Peak grad:   13.8 mmHg MV Decel Time: 303 msec             TR Vmax:        186.00 cm/s MV E velocity: 81.60 cm/s 103 cm/s MV A velocity: 74.40 cm/s 70.3 cm/s SHUNTS MV E/A ratio:  1.10       1.5       Systemic VTI:  0.18 m                                     Systemic Diam: 2.00 cm Mertie Moores MD Electronically signed by Mertie Moores MD Signature Date/Time: 04/18/2019/5:07:36 PM    Final

## 2019-04-21 NOTE — Progress Notes (Signed)
STROKE TEAM PROGRESS NOTE   INTERVAL HISTORY Pt more respensive, setting in chair; following simple commands. MRI showed unchanged right MCA large infarct, however with 56mm MLS with petechial hemorrhage but on heparin due to COVID and increase D-dimers. s/p IR w/ TICI2c revascularization of R M1 occlusion. MRA showed right M1 patent now.  Prognosis guarded.    Vitals:   04/20/19 2000 04/21/19 0000 04/21/19 0400 04/21/19 0800  BP: (!) 108/49 (!) 102/59 124/70 132/74  Pulse: (!) 58 (!) 52 (!) 51 (!) 50  Resp: (!) 23 (!) 25 (!) 22 (!) 22  Temp:   98.4 F (36.9 C)   TempSrc:      SpO2: 91% 93% 92% 93%  Weight:        CBC:  Recent Labs  Lab 04/17/19 1002 04/17/19 1012 04/18/19 0822 04/19/19 0215 04/20/19 0826 04/21/19 0345  WBC 5.1   < > 6.4   < > 10.2 7.9  NEUTROABS 3.5  --  4.7  --   --   --   HGB 12.7*   < > 11.5*   < > 11.5* 11.4*  HCT 37.8*   < > 33.9*   < > 32.9* 34.0*  MCV 109.6*   < > 108.0*   < > 107.5* 110.7*  PLT 130*   < > 134*   < > 139* 158   < > = values in this interval not displayed.    Basic Metabolic Panel:  Recent Labs  Lab 04/20/19 0826 04/21/19 0345  NA 137 137  K 4.0 4.2  CL 106 109  CO2 24 24  GLUCOSE 116* 135*  BUN 18 22  CREATININE 0.72 0.67  CALCIUM 8.3* 8.4*  MG 1.9 2.0  PHOS 3.3 2.9   Lipid Panel:     Component Value Date/Time   CHOL 93 04/18/2019 0822   TRIG 84 04/21/2019 0345   HDL 19 (L) 04/18/2019 0822   CHOLHDL 4.9 04/18/2019 0822   VLDL 21 04/18/2019 0822   LDLCALC 53 04/18/2019 0822   HgbA1c:  Lab Results  Component Value Date   HGBA1C 6.8 (H) 04/17/2019    IMAGING past 48 hours MR ANGIO HEAD WO CONTRAST  Result Date: 04/19/2019 CLINICAL DATA:  Follow-up right MCA occlusion with intervention. EXAM: MRI HEAD WITHOUT CONTRAST MRA HEAD WITHOUT CONTRAST TECHNIQUE: Multiplanar, multiecho pulse sequences of the brain and surrounding structures were obtained without intravenous contrast. Angiographic images of the head were  obtained using MRA technique without contrast. 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 Differences in diffusion appearance is attributed to artifact from petechial hemorrhage. There is progressive swelling with partial effacement of the right lateral ventricle. Midline shift only measures 2 mm. No entrapment. Confluent petechial hemorrhage is seen in the infarcted area. There are a few punctate acute infarcts in the left occipital parietal region, also seen prior. No extra-axial collection. Vascular: Arterial findings below. Skull and upper cervical spine: Negative Sinuses/Orbits: Negative MRA HEAD FINDINGS Motion degraded. Symmetric carotid and vertebral arteries. Dominant left PICA and right AICA. The vertebral, carotid, and basilar arteries are smooth and widely patent. No branch occlusion or aneurysm. Accounting for motion there is symmetric flow in the right MCA. IMPRESSION: 1. 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 There is progressive swelling with up to 2  mm of midline shift. 2. Redemonstrated minimal acute infarct in left hemispheric white matter. 3. Negative motion degraded MRA.  The right MCA is patent. Electronically Signed   By: Monte Fantasia M.D.   On: 04/19/2019 11:50   MR BRAIN WO CONTRAST  Result Date: 04/19/2019 CLINICAL DATA:  Follow-up right MCA occlusion with intervention. EXAM: MRI HEAD WITHOUT CONTRAST MRA HEAD WITHOUT CONTRAST TECHNIQUE: Multiplanar, multiecho pulse sequences of the brain and surrounding structures were obtained without intravenous contrast. Angiographic images of the head were obtained using MRA technique without contrast. COMPARISON:  Brain MRI from 2 days ago FINDINGS: MRI HEAD FINDINGS Brain: Large acute infarct involving the majority of  the right MCA territory, most confluent in the inferior division and at the striatum. Differences in diffusion appearance is attributed to artifact from petechial hemorrhage. There is progressive swelling with partial effacement of the right lateral ventricle. Midline shift only measures 2 mm. No entrapment. Confluent petechial hemorrhage is seen in the infarcted area. There are a few punctate acute infarcts in the left occipital parietal region, also seen prior. No extra-axial collection. Vascular: Arterial findings below. Skull and upper cervical spine: Negative Sinuses/Orbits: Negative MRA HEAD FINDINGS Motion degraded. Symmetric carotid and vertebral arteries. Dominant left PICA and right AICA. The vertebral, carotid, and basilar arteries are smooth and widely patent. No branch occlusion or aneurysm. Accounting for motion there is symmetric flow in the right MCA. IMPRESSION: 1. 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 There is progressive swelling with up to 2 mm of midline shift. 2. Redemonstrated minimal acute infarct in left hemispheric white matter. 3. Negative motion degraded MRA.  The right MCA is patent. Electronically Signed   By: Monte Fantasia M.D.   On: 04/19/2019 11:50   DG Chest Port 1 View  Result Date: 04/21/2019 CLINICAL DATA:  Shortness of breath. EXAM: PORTABLE CHEST 1 VIEW COMPARISON:  04/18/2019 FINDINGS: The endotracheal tube has been removed. The patient is rotated to the right with grossly unchanged cardiomediastinal silhouette. Lung volumes are lower than on the prior study with increased elevation of the right hemidiaphragm. Mid and lower lung airspace opacities bilaterally have slightly increased in the right base and slightly improved in the left base. No sizable pleural effusion or pneumothorax is identified. IMPRESSION: Interval extubation. Lower lung volumes with mildly worsened aeration in the right lung base and  mildly improved aeration in the left lung base. Electronically Signed   By: Logan Bores M.D.   On: 04/21/2019 08:54   Cerebral Angio 04/17/2019 S/P RT common carotid arteriogram  Followed by near complete revascularization of occcluded RT MCA M1 seg with x 1 pass with 75mmx 40 mm solitaire X  retriever and x 1 pass with 71mm x 32 mm Trevoprovue retriever and penumbra aspiration achieving a TICI 2C revascularization.[name]  PHYSICAL EXAM   Temp:  [97.9 F (36.6 C)-98.8 F (37.1 C)] 98.4 F (36.9 C) (02/14 0400) Pulse Rate:  [50-70] 50 (02/14 0800) Resp:  [20-27] 22 (02/14 0800) BP: (102-132)/(49-74) 132/74 (02/14 0800) SpO2:  [91 %-94 %] 93 % (02/14 0800)  General - Well nourished, well developed, in no apparent distress but lethargic.  Ophthalmologic - fundi not visualized due to noncooperation.  Cardiovascular - Regular rhythm and rate.  Mental Status -  Level of arousal and orientation to time, place, and person were intact. Language including expression, naming, repetition, comprehension was assessed and found intact. Mild dysarthria  Cranial Nerves II -  XII - II - left visual neglect. III, IV, VI - right gaze preference and not able to cross midline. V - Facial sensation showed left sensation neglect. VII - left facial droop. VIII - Hearing & vestibular intact bilaterally. X - Palate elevates symmetrically. XI - Shoulder shrug decreased on the left. XII - Tongue protrusion intact.  Motor Strength - The patient's strength was good (5/5) RUE and RLE - follows commands well, however, left UE 1/5 proximal and 0/5 distal, left LE 2+/5 proximal and 4/5 distal.  Bulk was normal and fasciculations were absent.   Motor Tone - Muscle tone was assessed at the neck and appendages and was normal.  Reflexes - The patient's reflexes were symmetrical in all extremities and he had no pathological reflexes.  Sensory - Light touch, temperature/pinprick were assessed and diminished on the left  with left sensory neglect.    Coordination - The patient had normal movements in the right hand with no ataxia or dysmetria.  Tremor was absent.  Gait and Station - deferred.   ASSESSMENT/PLAN Mr. Henry [name] is a 79 y.o. male with history of mild hypertension, low back pain, polycythemia, flu sx x 1 week presenting to ED earlier in the day with  generalized weakness, cough and fever, dx with COVID and left AMA. Later developed left-sided weakness, neglect and R gaze preference. EMS returned him to the ED.   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 Hypercoagulable from COVID-19 infection vs cardioembolic source  CT head R MCA infarction w/ subtle petechial hemorrhage R frontal lobe. hyperdense R M1. 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 Proximal L ICA 30-40% stenosis. R VA w/ moderate narrowing at origin. L V1 moderate origin stenosis. Lung apices suspicious for interstitial lung dz, nodular opacity R lung apex.  CT perfusion R MCA 74mL core infarct. 164mL penumbra w/ 169mL mismatch.   Limited MRI  Extensive R MCA infarct. Punctate L parietal and occipital lobe infarcts.  Cerebral angio R M1 occlusion w/ near complete revascularization TICI2c  Repeat CT head 2/10 2035 R MCA cortical, subcortical and BG infarcts with likely contrast staining instead of hemorrhage. No mass effect or shift.   Repeat MRI 2/12 unchanged large R MCA infarct w/ petechial hemorrhage. Progressive edema w/ 83mm midline shift. Small L brain infarct.   Repeat MRA 2/12 R MCA remains patent.   LE venous doppler DVT L gastrocnemius vein  2D Echo EF 55-60%. No source of embolus   Transcranial doppler w/ bubble pending   LDL 53  HgbA1c 6.8  Lovenox 40 mg sq daily for VTE prophylaxis  No antithrombotic prior to admission, now on IV heparin given acute DVT in the  setting of high D-dimer and COVID infection  Therapy recommendations:  CIR-> SNF (needs to be > 20 days since positive COVID for xfer to CIR)  Disposition:  pending   Cerebral edema  MRI showed right to left MLS 52mm  Mild lethargy today  Close neuro monitoring  Consider repeat CT if neuro change   Acute Respiratory Failure COVID-19 PNA  intubated for IR, left intubated after IR d/t COVID  Remdesivir D4/5  Decadron D4/10  Extubate 2/10  D-Dimer 13.53-> >20 ->18.73  CRP 13.5->10->9.7    DVT, distal  LE venous doppler DVT L gastrocnemius vein  On IV  heparin given high D-Dimer and COVID infection  Hypertension  Home meds:  None listed . Long-term BP goal normotensive  Dysphagia . Secondary to stroke . Cleared for Dys 1 nectar thick liquids  . Speech on board    Other Stroke Risk Factors  Advanced age  Former Cigarette smoker, quit 42 yrs ago  Other Active Problems  Acute blood loss anemia Hgb 12.6->10.9->11.5->11.3->11.5->11.4  Chronic thrombocytopenia PLT 130->134->124->139->158  Mild Bradycardia - 67s  Hospital day # 4  Large right MCA stroke, left DVT, Covid infection, high D-dimer, dysphagia and cerebral edema, treatment plan and potential prognosis. This patient's care requires review of multiple databases, neurological assessment, discussion with family, other specialists and medical decision making of high complexity.      To contact Stroke Continuity provider, please refer to [email or web address] After hours, contact General Neurology

## 2019-04-22 ENCOUNTER — Inpatient Hospital Stay (HOSPITAL_COMMUNITY): Payer: Medicare HMO

## 2019-04-22 DIAGNOSIS — I63511 Cerebral infarction due to unspecified occlusion or stenosis of right middle cerebral artery: Secondary | ICD-10-CM

## 2019-04-22 LAB — COMPREHENSIVE METABOLIC PANEL
ALT: 49 U/L — ABNORMAL HIGH (ref 0–44)
AST: 63 U/L — ABNORMAL HIGH (ref 15–41)
Albumin: 2.1 g/dL — ABNORMAL LOW (ref 3.5–5.0)
Alkaline Phosphatase: 78 U/L (ref 38–126)
Anion gap: 11 (ref 5–15)
BUN: 22 mg/dL (ref 8–23)
CO2: 24 mmol/L (ref 22–32)
Calcium: 8.4 mg/dL — ABNORMAL LOW (ref 8.9–10.3)
Chloride: 105 mmol/L (ref 98–111)
Creatinine, Ser: 0.67 mg/dL (ref 0.61–1.24)
GFR calc Af Amer: >60 mL/min (ref >60)
GFR calc non Af Amer: >60 mL/min (ref >60)
Glucose, Bld: 104 mg/dL — ABNORMAL HIGH (ref 70–99)
Potassium: 3.6 mmol/L (ref 3.5–5.1)
Sodium: 140 mmol/L (ref 135–145)
Total Bilirubin: 0.6 mg/dL (ref 0.3–1.2)
Total Protein: 5.6 g/dL — ABNORMAL LOW (ref 6.5–8.1)

## 2019-04-22 LAB — GLUCOSE, CAPILLARY
Glucose-Capillary: 94 mg/dL (ref 70–99)
Glucose-Capillary: 80 mg/dL (ref 70–99)
Glucose-Capillary: 90 mg/dL (ref 70–99)
Glucose-Capillary: 158 mg/dL — ABNORMAL HIGH (ref 70–99)

## 2019-04-22 LAB — CBC
HCT: 36.5 % — ABNORMAL LOW (ref 39.0–52.0)
Hemoglobin: 12.1 g/dL — ABNORMAL LOW (ref 13.0–17.0)
MCH: 36.6 pg — ABNORMAL HIGH (ref 26.0–34.0)
MCHC: 33.2 g/dL (ref 30.0–36.0)
MCV: 110.3 fL — ABNORMAL HIGH (ref 80.0–100.0)
Platelets: 162 10*3/uL (ref 150–400)
RBC: 3.31 MIL/uL — ABNORMAL LOW (ref 4.22–5.81)
RDW: 14.1 % (ref 11.5–15.5)
WBC: 6.7 10*3/uL (ref 4.0–10.5)
nRBC: 0 % (ref 0.0–0.2)

## 2019-04-22 LAB — C-REACTIVE PROTEIN: CRP: 6.9 mg/dL — ABNORMAL HIGH (ref <1.0)

## 2019-04-22 LAB — MAGNESIUM: Magnesium: 1.9 mg/dL (ref 1.7–2.4)

## 2019-04-22 LAB — D-DIMER, QUANTITATIVE: D-Dimer, Quant: 3.3 ug/mL-FEU — ABNORMAL HIGH (ref 0.00–0.50)

## 2019-04-22 LAB — HEPARIN LEVEL (UNFRACTIONATED): Heparin Unfractionated: 0.18 IU/mL — ABNORMAL LOW (ref 0.30–0.70)

## 2019-04-22 LAB — TRIGLYCERIDES: Triglycerides: 104 mg/dL (ref <150)

## 2019-04-22 MED ORDER — DEXAMETHASONE SODIUM PHOSPHATE 4 MG/ML IJ SOLN
4.0000 mg | Freq: Every day | INTRAMUSCULAR | Status: DC
  Administered 2019-04-22 – 2019-04-23 (×2): 4 mg via INTRAVENOUS
  Filled 2019-04-22 (×2): qty 1

## 2019-04-22 MED ORDER — APIXABAN 5 MG PO TABS
5.0000 mg | ORAL_TABLET | Freq: Two times a day (BID) | ORAL | Status: DC
  Administered 2019-04-22 – 2019-04-25 (×7): 5 mg via ORAL
  Filled 2019-04-22 (×7): qty 1

## 2019-04-22 MED ORDER — DEXAMETHASONE SODIUM PHOSPHATE 4 MG/ML IJ SOLN: 2.0000 mg | INTRAMUSCULAR | Status: DC

## 2019-04-22 NOTE — Progress Notes (Signed)
Physical Therapy Treatment Patient Details Name: Henry [name] MRN: [id number] DOB: 02/05/1941 Today's Date: 04/22/2019    History of Present Illness 79 year old male patient admitted to the intensive care status post acute proximal right MCA stroke on 2/10.  Was outside of window for TPA.  Went to interventional radiology for clot extraction, returned to ICU on ventilator critical care asked to assist with postop care.    PT Comments    Pt awake on entry, agreeable to getting up with therapy to recliner. Pt has improved ability to look across midline to his L and has sensation throughout is L side. Pt L LE with marked increase in strength from last session. Pt able to come to EoB with modA, and is able to come to standing with maxA and pivot transfer with maxAx2. Once in recliner pt is able to perform AROM of bilateral LE. Pt set up to Face Time with daughter at end of session. D/c plans remain appropriate at this time. PT will continue to follow acutely.    Follow Up Recommendations  Supervision/Assistance - 24 hour;SNF     Equipment Recommendations  Other (comment)(TBA)       Precautions / Restrictions Precautions Precautions: Fall Restrictions Weight Bearing Restrictions: No    Mobility  Bed Mobility Overal bed mobility: Needs Assistance Bed Mobility: Supine to Sit     Supine to sit: Mod assist;HOB elevated    General bed mobility comments: Pt with improved ability to move B LE to EOB, follow commands to use R hand for bed rail  Transfers Overall transfer level: Needs assistance Equipment used: None Transfers: Sit to/from [employer] Transfers Sit to Stand: Max assist Stand pivot transfers: Max assist;+2 physical assistance       General transfer comment: Once in standing, pt required Max A to maintain standing balance with difficulty weight shifitng. Guided pt in pivot to recliner chair with 2 person assist. Pt assisting by reaching R hand to recliner  armrest.    Modified Rankin (Stroke Patients Only) Modified Rankin (Stroke Patients Only) Pre-Morbid Rankin Score: No symptoms Modified Rankin: Severe disability     Balance Overall balance assessment: Needs assistance Sitting-balance support: Single extremity supported;Feet supported Sitting balance-Leahy Scale: Poor     Standing balance support: Bilateral upper extremity supported Standing balance-Leahy Scale: Zero                              Cognition Arousal/Alertness: Awake/alert Behavior During Therapy: Flat affect Overall Cognitive Status: Impaired/Different from baseline Area of Impairment: Attention;Safety/judgement;Awareness;Problem solving                 Orientation Level: Time;Situation Current Attention Level: Focused;Sustained Memory: Decreased short-term memory Following Commands: Follows one step commands inconsistently;Follows one step commands with increased time Safety/Judgement: Decreased awareness of safety;Decreased awareness of deficits Awareness: Intellectual Problem Solving: Slow processing;Decreased initiation;Difficulty sequencing;Requires verbal cues;Requires tactile cues General Comments: poor attention to left       Exercises General Exercises - Lower Extremity Ankle Circles/Pumps: AROM;Both;10 reps Long Arc Quad: AROM;Both;10 reps;Seated Hip Flexion/Marching: AROM;Both;10 reps;Seated    General Comments General comments (skin integrity, edema, etc.): Some edema in L hand, attempted to prop up L UE on pillow in chair. Vitals WFL on RA during session. Assisted pt in Facetime with family - left talking to daughter      Pertinent Vitals/Pain Pain Assessment: No/denies pain           PT  Goals (current goals can now be found in the care plan section) Acute Rehab PT Goals Patient Stated Goal: see his wife PT Goal Formulation: With patient Time For Goal Achievement: 05/02/19 Potential to Achieve Goals: Good Progress  towards PT goals: Progressing toward goals    Frequency    Min 4X/week      PT Plan Current plan remains appropriate    Co-evaluation PT/OT/SLP Co-Evaluation/Treatment: Yes Reason for Co-Treatment: For patient/therapist safety PT goals addressed during session: Mobility/safety with mobility        AM-PAC PT "6 Clicks" Mobility   Outcome Measure  Help needed turning from your back to your side while in a flat bed without using bedrails?: A Lot Help needed moving from lying on your back to sitting on the side of a flat bed without using bedrails?: Total Help needed moving to and from a bed to a chair (including a wheelchair)?: Total Help needed standing up from a chair using your arms (e.g., wheelchair or bedside chair)?: Total Help needed to walk in hospital room?: Total Help needed climbing 3-5 steps with a railing? : Total 6 Click Score: 7    End of Session Equipment Utilized During Treatment: Gait belt Activity Tolerance: Patient tolerated treatment well Patient left: in bed;with call bell/phone within reach;with bed alarm set Nurse Communication: Mobility status PT Visit Diagnosis: Unsteadiness on feet (R26.81);Other abnormalities of gait and mobility (R26.89);Difficulty in walking, not elsewhere classified (R26.2)     Time: [id number] PT Time Calculation (min) (ACUTE ONLY): 45 min  Charges:  $Therapeutic Activity: 8-22 mins                     Hannelore Bova B. Migdalia Dk PT, DPT Acute Rehabilitation Services Pager [phone] Office [phone]    [place] 04/22/2019, 5:29 PM

## 2019-04-22 NOTE — Discharge Instructions (Addendum)
Follow with Primary MD [name], [name], MD in 7 days   Get CBC, CMP, 2 view Chest X ray -  checked next visit within 1 week by Primary MD    Activity: As tolerated with Full fall precautions use walker/cane & assistance as needed  Disposition SNF  Diet: Dysphagia 1 diet with honey thick liquids with feeding assistance and aspiration precautions.  Special Instructions: If you have smoked or chewed Tobacco  in the last 2 yrs please stop smoking, stop any regular Alcohol  and or any Recreational drug use.  On your next visit with your primary care physician please Get Medicines reviewed and adjusted.  Please request your Prim.MD to go over all Hospital Tests and Procedure/Radiological results at the follow up, please get all Hospital records sent to your Prim MD by signing hospital release before you go home.  If you experience worsening of your admission symptoms, develop shortness of breath, life threatening emergency, suicidal or homicidal thoughts you must seek medical attention immediately by calling 911 or calling your MD immediately  if symptoms less severe.  You Must read complete instructions/literature along with all the possible adverse reactions/side effects for all the Medicines you take and that have been prescribed to you. Take any new Medicines after you have completely understood and accpet all the possible adverse reactions/side effects.       Information on my medicine - ELIQUIS (apixaban)  This medication education was reviewed with me or my healthcare representative as part of my discharge preparation.  The pharmacist that spoke with me during my hospital stay was:  Werner Lean, [facility]  Why was Eliquis prescribed for you? Eliquis was prescribed to treat blood clots that may have been found in the veins of your legs (deep vein thrombosis) or in your lungs (pulmonary embolism) and to reduce the risk of them occurring again.  What do You need to know about  Eliquis ? The dose is ONE 5 mg tablet taken TWICE daily.  Eliquis may be taken with or without food.   Try to take the dose about the same time in the morning and in the evening. If you have difficulty swallowing the tablet whole please discuss with your pharmacist how to take the medication safely.  Take Eliquis exactly as prescribed and DO NOT stop taking Eliquis without talking to the doctor who prescribed the medication.  Stopping may increase your risk of developing a new blood clot.  Refill your prescription before you run out.  After discharge, you should have regular check-up appointments with your healthcare provider that is prescribing your Eliquis.    What do you do if you miss a dose? If a dose of ELIQUIS is not taken at the scheduled time, take it as soon as possible on the same day and twice-daily administration should be resumed. The dose should not be doubled to make up for a missed dose.  Important Safety Information A possible side effect of Eliquis is bleeding. You should call your healthcare provider right away if you experience any of the following: ? Bleeding from an injury or your nose that does not stop. ? Unusual colored urine (red or dark brown) or unusual colored stools (red or black). ? Unusual bruising for unknown reasons. ? A serious fall or if you hit your head (even if there is no bleeding).  Some medicines may interact with Eliquis and might increase your risk of bleeding or clotting while on Eliquis. To help avoid this,  healthcare provider or pharmacist prior to using any new prescription or non-prescription medications, including herbals, vitamins, non-steroidal anti-inflammatory drugs (NSAIDs) and supplements.  This website has more information on Eliquis (apixaban): http://www.eliquis.com/eliquis/home 

## 2019-04-22 NOTE — Progress Notes (Signed)
Patient returned to floor from CT.

## 2019-04-22 NOTE — Progress Notes (Signed)
PROGRESS NOTE                                                                                                                                                                                                             Patient Demographics:    Henry [name], is a 79 y.o. male, DOB - Mar 09, 1940, UEA:[id number]  Outpatient Primary MD for the patient is [name], [name], MD   Admit date - 04/17/2019   LOS - 5  CC - L arm weakness     Brief Narrative: Patient is a 79 y.o. male with PMHx of HTN, polycythemia, chronic GERD, chronic low back pain who was admitted on 2/10 following a right MCA stroke-patient was taken by IR for revascularization procedure as patient was outside the TPA window-following which he was admitted to the ICU and required ventilator support.  Patient was also found to have COVID-19.  Upon clinical stability-and extubation-patient was transferred to the [facility] on 2/12.  Significant events: 2/10>> admitted with acute right MCA stroke-to IR for revascularization of Right MCA-return to ICU on a ventilator 2/11>> extubated 2/12>> transfer to [facility]   Subjective:   Patient in recliner denies any headache or chest pain, no shortness of breath.  Continues to have left-sided weakness mostly in the left arm and face.   Assessment  & Plan :   Acute Hypoxic Resp Failure due to Covid 19 Viral pneumonia: Remains stable-on just 1 L of oxygen-he was extubated on 2/12-CRP has significantly jumped up today-he had a fever yesterday afternoon-he is at high risk for aspiration pneumonia.  Sputum cultures positive on 2/10 for MSSA.  He is also status post extubation on 2/12.  Suspect that he may have superimposed aspiration pneumonia - on Ancef.  Finished remdesivir continue steroids.  Trend procalcitonin clinically improving.   O2 requirements:  SpO2: 97 % O2 Flow Rate (L/min): 2 L/min FiO2 (%): 28 %    COVID-19 Labs: Recent Labs    04/20/19 0826 04/21/19 0345  DDIMER 5.47* 3.45*  FERRITIN 184 716*  CRP 18.9* 16.4*    No results found for: BNP  Recent Labs  Lab 04/17/19 1630 04/21/19 0345  PROCALCITON 0.10 0.17    Lab Results  Component Value Date   SARSCOV2NAA POSITIVE (A) 04/16/2019     COVID-19 Medications: Steroids: 2/10>> Remdesivir: 2/10>>  Antibiotics: Ancef: 2/13>>  Prone/Incentive Spirometry: encouraged incentive spirometry use 3-4/hour.   Left lower extremity DVT: Likely provoked by COVID-19-on IV heparin.  Large right MCA and punctate left parietal/occipital lobe infarct: On IV heparin and aspirin.  Patient is s/p revascularization of right M1 occlusion by interventional radiology.  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  Echo with preserved EF and without any embolic foci.  LDL 53, A1c 6.8.  Has gross dysarthria, left facial droop, and significant weakness in the left upper extremity.  Will await further recommendations from stroke MD.  Lab Results  Component Value Date   CHOL 93 04/18/2019   HDL 19 (L) 04/18/2019   LDLCALC 53 04/18/2019   LDLDIRECT 135.0 02/24/2017   TRIG 84 04/21/2019   CHOLHDL 4.9 04/18/2019    Lab Results  Component Value Date   HGBA1C 6.8 (H) 04/17/2019    TTE -  1. Left ventricular ejection fraction, by estimation, is 55 to 60%. The left ventricle has normal function. The left ventrical has no regional wall motion abnormalities. Left ventricular diastolic parameters are  indeterminate.  2. Right ventricular systolic function is normal. The right ventricular size is normal. There is normal pulmonary artery systolic pressure.  3. The mitral valve is normal in structure and function. trivial mitral valve regurgitation. No evidence of mitral stenosis.  4. The aortic valve is normal in structure and function. Aortic valve regurgitation is not visualized. No aortic stenosis is present.    Dysphagia: Secondary to  CVA-followed by SLP-on dysphagia 1 diet.  Maintain aspiration precautions  HTN: BP controlled-not on any antihypertensives.  Allow permissive hypertension.    Consults  :  Neuro/PCCM, IR  Procedures  :    ETT:2/10>>2/11 Revascularization of right proximal MCA 2/10     Condition -   Guarded  Family Communication  :  Daughter updated over the phone 04/21/2019  Code Status :  Full Code  Diet :  Diet Order            DIET - DYS 1 Room service appropriate? No; Fluid consistency: Nectar Thick  Diet effective now               Disposition Plan  :  Remain hospitalized-CIR versus SNF on discharge.  Barriers to discharge: Hypoxia requiring O2 supplementation/complete 5 days of IV Remdesivir. Work up for CVA in progress  Antimicorbials  :    Anti-infectives (From admission, onward)   Start     Dose/Rate Route Frequency Ordered Stop   04/20/19 1615  ceFAZolin (ANCEF) IVPB 1 g/50 mL premix     1 g 100 mL/hr over 30 Minutes Intravenous Every 8 hours 04/20/19 1602     04/18/19 1000  remdesivir 100 mg in sodium chloride 0.9 % 100 mL IVPB     100 mg 200 mL/hr over 30 Minutes Intravenous Daily 04/17/19 1457 04/21/19 1024   04/17/19 1630  remdesivir 200 mg in sodium chloride 0.9% 250 mL IVPB     200 mg 580 mL/hr over 30 Minutes Intravenous Once 04/17/19 1457 04/17/19 1908   04/17/19 1128  ceFAZolin (ANCEF) 2-4 GM/100ML-% IVPB    Note to Pharmacy: Fredric Dine   : cabinet override      04/17/19 1128 04/17/19 1543     DVT Prophylaxis  : IV heparin  Inpatient Medications  Scheduled Meds: .  stroke: mapping our early stages of recovery book   Does not apply Once  . vitamin C  500 mg Per Tube Daily  .  chlorhexidine gluconate (MEDLINE KIT)  15 mL Mouth Rinse BID  . dexamethasone (DECADRON) injection  4 mg Intravenous Daily  . docusate sodium  200 mg Oral BID  . folic acid  1 mg Intravenous Daily  . insulin aspart  0-9 Units Subcutaneous TID WC  . polyethylene glycol  17 g  Oral BID  . thiamine injection  100 mg Intravenous Daily  . zinc sulfate  220 mg Per Tube Daily   Continuous Infusions: . sodium chloride 50 mL/hr at 04/21/19 0205  .  ceFAZolin (ANCEF) IV 1 g (04/22/19 0928)  . heparin 1,450 Units/hr (04/21/19 1306)   PRN Meds:.[DISCONTINUED] acetaminophen **OR** acetaminophen (TYLENOL) oral liquid 160 mg/5 mL **OR** [DISCONTINUED] acetaminophen, [DISCONTINUED] acetaminophen **OR** acetaminophen (TYLENOL) oral liquid 160 mg/5 mL **OR** [DISCONTINUED] acetaminophen, hydrALAZINE, iohexol, Resource ThickenUp Clear, senna-docusate   Time Spent in minutes  25  See all Orders from today for further details   Lala Lund M.D on 04/22/2019 at 11:09 AM  To page go to [email or web address] - use universal password  [facility] -  Office  [phone]    Objective:   Vitals:   04/21/19 2137 04/22/19 0522 04/22/19 0800 04/22/19 1000  BP: 128/67  139/73 127/63  Pulse:   (!) 43 (!) 48  Resp:   (!) 27 17  Temp: 98.4 F (36.9 C) 98.4 F (36.9 C)    TempSrc: Oral Oral    SpO2:   94% 97%  Weight:        Wt Readings from Last 3 Encounters:  04/18/19 80.1 kg  04/16/19 81.6 kg  08/24/18 81.6 kg     Intake/Output Summary (Last 24 hours) at 04/22/2019 1109 Last data filed at 04/22/2019 0840 Gross per 24 hour  Intake 3460.52 ml  Output 2500 ml  Net 960.52 ml     Physical Exam  Awake Alert, left-sided facial droop and arm weakness, arm strength 1/5, leg strength 3/5, facial droop seems to have slightly improved on 04/22/2019 [place].AT,PERRAL Supple Neck,No JVD, No cervical lymphadenopathy appriciated.  Symmetrical Chest wall movement, Good air movement bilaterally, CTAB RRR,No Gallops, Rubs or new Murmurs, No Parasternal Heave +ve B.Sounds, Abd Soft, No tenderness, No organomegaly appriciated, No rebound - guarding or rigidity. No Cyanosis, Clubbing or edema, No new Rash or bruise     Data Review:    CBC Recent Labs  Lab 04/16/19 1358  04/16/19 1358 04/17/19 1002 04/17/19 1012 04/17/19 1557 04/18/19 0822 04/19/19 0215 04/20/19 0826 04/21/19 0345  WBC 5.3   < > 5.1  --   --  6.4 7.8 10.2 7.9  HGB 13.8   < > 12.7*   < > 10.9* 11.5* 11.3* 11.5* 11.4*  HCT 40.5   < > 37.8*   < > 32.0* 33.9* 33.2* 32.9* 34.0*  PLT 145*   < > 130*  --   --  134* 124* 139* 158  MCV 108.6*   < > 109.6*  --   --  108.0* 109.9* 107.5* 110.7*  MCH 37.0*   < > 36.8*  --   --  36.6* 37.4* 37.6* 37.1*  MCHC 34.1   < > 33.6  --   --  33.9 34.0 35.0 33.5  RDW 13.7   < > 13.7  --   --  14.0 13.9 13.6 14.0  LYMPHSABS 1.0  --  0.9  --   --  1.0  --   --   --   MONOABS 0.5  --  0.5  --   --  0.7  --   --   --   EOSABS 0.1  --  0.1  --   --  0.0  --   --   --   BASOSABS 0.0  --  0.0  --   --  0.0  --   --   --    < > = values in this interval not displayed.    Chemistries  Recent Labs  Lab 04/17/19 1002 04/17/19 1002 04/17/19 1012 04/17/19 1012 04/17/19 1557 04/18/19 0822 04/19/19 0215 04/20/19 0826 04/21/19 0345  NA 136   < > 136   < > 137 140 139 137 137  K 4.0   < > 3.8   < > 4.1 4.1 4.1 4.0 4.2  CL 103   < > 102  --   --  108 108 106 109  CO2 21*  --   --   --   --  '22 24 24 24  ' GLUCOSE 158*   < > 150*  --   --  110* 125* 116* 135*  BUN 18   < > 18  --   --  '15 20 18 22  ' CREATININE 0.95   < > 0.70  --   --  0.79 0.87 0.72 0.67  CALCIUM 8.5*  --   --   --   --  8.1* 8.4* 8.3* 8.4*  MG  --   --   --   --   --   --  2.0 1.9 2.0  AST 36  --   --   --   --  32 32 42* 30  ALT 33  --   --   --   --  27 26 41 36  ALKPHOS 71  --   --   --   --  69 70 80 86  BILITOT 1.0  --   --   --   --  0.9 0.9 1.3* 0.7   < > = values in this interval not displayed.   ------------------------------------------------------------------------------------------------------------------ Recent Labs    04/20/19 0826 04/21/19 0345  TRIG 70 84    Lab Results  Component Value Date   HGBA1C 6.8 (H) 04/17/2019    ------------------------------------------------------------------------------------------------------------------ No results for input(s): TSH, T4TOTAL, T3FREE, THYROIDAB in the last 72 hours.  Invalid input(s): FREET3 ------------------------------------------------------------------------------------------------------------------ Recent Labs    04/20/19 0826 04/21/19 0345  FERRITIN 184 716*    Coagulation profile Recent Labs  Lab 04/17/19 1630  INR 1.2    Recent Labs    04/20/19 0826 04/21/19 0345  DDIMER 5.47* 3.45*    Cardiac Enzymes No results for input(s): CKMB, TROPONINI, MYOGLOBIN in the last 168 hours.  Invalid input(s): CK ------------------------------------------------------------------------------------------------------------------ No results found for: BNP  Micro Results Recent Results (from the past 240 hour(s))  Culture, blood (routine x 2)     Status: None   Collection Time: 04/16/19  1:45 PM   Specimen: BLOOD  Result Value Ref Range Status   Specimen Description BLOOD LEFT ANTECUBITAL  Final   Special Requests   Final    BOTTLES DRAWN AEROBIC AND ANAEROBIC Blood Culture results may not be optimal due to an inadequate volume of blood received in culture bottles   Culture   Final    NO GROWTH 5 DAYS Performed at [facility], [place] [address], [place], [place] [address]    Report Status 04/21/2019 FINAL  Final  Culture, blood (routine x 2)     Status: None  Collection Time: 04/16/19  2:49 PM   Specimen: BLOOD RIGHT HAND  Result Value Ref Range Status   Specimen Description BLOOD RIGHT HAND  Final   Special Requests   Final    BOTTLES DRAWN AEROBIC AND ANAEROBIC Blood Culture results may not be optimal due to an inadequate volume of blood received in culture bottles   Culture   Final    NO GROWTH 5 DAYS Performed at [facility], [place] [address] [place], [place] [address]    Report Status 04/21/2019 FINAL  Final  Urine culture      Status: Abnormal   Collection Time: 04/16/19  4:45 PM   Specimen: Urine, Random  Result Value Ref Range Status   Specimen Description URINE, RANDOM  Final   Special Requests NONE  Final   Culture (A)  Final    <10,000 COLONIES/mL INSIGNIFICANT GROWTH Performed at [facility], [place] [address] [place], [place] [address]    Report Status 04/17/2019 FINAL  Final  Respiratory Panel by RT PCR (Flu A&B, Covid) -     Status: Abnormal   Collection Time: 04/16/19  5:30 PM  Result Value Ref Range Status   SARS Coronavirus 2 by RT PCR POSITIVE (A) NEGATIVE Final    Comment: RESULT CALLED TO, READ BACK BY AND VERIFIED WITH: Dois Davenport RN [id number] 04/16/19 A BROWNING (NOTE) SARS-CoV-2 target nucleic acids are DETECTED. SARS-CoV-2 RNA is generally detectable in upper respiratory specimens  during the acute phase of infection. Positive results are indicative of the presence of the identified virus, but do not rule out bacterial infection or co-infection with other pathogens not detected by the test. Clinical correlation with patient history and other diagnostic information is necessary to determine patient infection status. The expected result is Negative. Fact Sheet for Patients:  PinkCheek.be Fact Sheet for Healthcare Providers: GravelBags.it This test is not yet approved or cleared by the [place] FDA and  has been authorized for detection and/or diagnosis of SARS-CoV-2 by FDA under an Emergency Use Authorization (EUA).  This EUA will remain in effect (meaning this test can be used) fo r the duration of  the COVID-19 declaration under Section 564(b)(1) of the Act, 21 U.S.C. section 360bbb-3(b)(1), unless the authorization is terminated or revoked sooner.    Influenza A by PCR NEGATIVE NEGATIVE Final   Influenza B by PCR NEGATIVE NEGATIVE Final    Comment: (NOTE) The Xpert Xpress SARS-CoV-2/FLU/RSV assay is intended as an aid  in  the diagnosis of influenza from Nasopharyngeal swab specimens and  should not be used as a sole basis for treatment. Nasal washings and  aspirates are unacceptable for Xpert Xpress SARS-CoV-2/FLU/RSV  testing. Fact Sheet for Patients: PinkCheek.be Fact Sheet for Healthcare Providers: GravelBags.it This test is not yet approved or cleared by the [place] FDA and  has been authorized for detection and/or diagnosis of SARS-CoV-2 by  FDA under an Emergency Use Authorization (EUA). This EUA will remain  in effect (meaning this test can be used) for the duration of the  Covid-19 declaration under Section 564(b)(1) of the Act, 21  U.S.C. section 360bbb-3(b)(1), unless the authorization is  terminated or revoked. Performed at [facility], [place] [address], [place], [place] [address]   Culture, respiratory (non-expectorated)     Status: None   Collection Time: 04/17/19  5:05 PM   Specimen: Tracheal Aspirate; Respiratory  Result Value Ref Range Status   Specimen Description TRACHEAL ASPIRATE  Final  Special Requests   Final    NONE Performed at [facility], [place] [address] [place], [place] [address]    Gram Stain NO WBC SEEN NO ORGANISMS SEEN   Final   Culture RARE STAPHYLOCOCCUS AUREUS  Final   Report Status 04/20/2019 FINAL  Final   Organism ID, Bacteria STAPHYLOCOCCUS AUREUS  Final      Susceptibility   Staphylococcus aureus - MIC*    CIPROFLOXACIN <=0.5 SENSITIVE Sensitive     ERYTHROMYCIN <=0.25 SENSITIVE Sensitive     GENTAMICIN <=0.5 SENSITIVE Sensitive     OXACILLIN 0.5 SENSITIVE Sensitive     TETRACYCLINE <=1 SENSITIVE Sensitive     VANCOMYCIN <=0.5 SENSITIVE Sensitive     TRIMETH/SULFA <=10 SENSITIVE Sensitive     CLINDAMYCIN <=0.25 SENSITIVE Sensitive     RIFAMPIN <=0.5 SENSITIVE Sensitive     Inducible Clindamycin NEGATIVE Sensitive     * RARE STAPHYLOCOCCUS AUREUS  MRSA PCR Screening      Status: None   Collection Time: 04/20/19  9:24 AM   Specimen: Nasal Mucosa; Nasopharyngeal  Result Value Ref Range Status   MRSA by PCR NEGATIVE NEGATIVE Final    Comment:        The GeneXpert MRSA Assay (FDA approved for NASAL specimens only), is one component of a comprehensive MRSA colonization surveillance program. It is not intended to diagnose MRSA infection nor to guide or monitor treatment for MRSA infections. Performed at [facility], [place] [address], [place], [place] [address]     Radiology Reports CT ANGIO HEAD W OR WO CONTRAST  Result Date: 04/17/2019 CLINICAL DATA:  Stroke, follow-up. EXAM: CT ANGIOGRAPHY HEAD AND NECK CT PERFUSION BRAIN TECHNIQUE: Multidetector CT imaging of the head and neck was performed using the standard protocol during bolus administration of intravenous contrast. Multiplanar CT image reconstructions and MIPs were obtained to evaluate the vascular anatomy. Carotid stenosis measurements (when applicable) are obtained utilizing NASCET criteria, using the distal internal carotid diameter as the denominator. Multiphase CT imaging of the brain was performed following IV bolus contrast injection. Subsequent parametric perfusion maps were calculated using RAPID software. CONTRAST:  167m OMNIPAQUE IOHEXOL 350 MG/ML SOLN COMPARISON:  Noncontrast head CT performed earlier the same day. FINDINGS: CTA NECK FINDINGS Aortic arch: The origins of the innominate and left common carotid arteries are incompletely imaged. The visualized aortic arch is unremarkable. No significant innominate or proximal subclavian stenosis visualized. Right carotid system: CCA patent to the bifurcation without stenosis. Prominent predominantly calcified plaque within the distal common carotid artery and proximal ICA. There is likely greater than 70% stenosis at the origin of the ICA, although exact quantification of stenosis is difficult due to irregularity of calcified plaque. Distal to  this the ICA is patent within the neck without stenosis. Left carotid system: CCA patent to the bifurcation without stenosis. Calcified plaque within the distal common carotid artery, carotid bifurcation and proximal ICA. Resultant 30-40% narrowing of the proximal ICA as compared to the more distal vessel. Distal to this the ICA is patent within the neck without stenosis. Vertebral arteries: Right vertebral artery dominant. 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 Calcified plaque at the origin of the medial V1 left vertebral artery branch with at least moderate ostial stenosis. More distally the bilateral vertebral arteries are patent within the neck without stenosis. Skeleton: No acute bony abnormality. Cervical spondylosis with C5-C6 and C6-C7 posterior disc osteophytes. Other neck: No neck mass or cervical adenopathy. Upper chest: Paraseptal emphysema within  the imaged lung apices. Also within the imaged lung apices but there is interstitial prominence suspicious for interstitial lung disease. There are more nodular opacities within the right upper lobe measuring up to 1.4 cm (series 7, image 56) (series 7, image 99). Review of the MIP images confirms the above findings CTA HEAD FINDINGS Anterior circulation: The intracranial internal carotid arteries are patent with scattered calcified plaque. No more than mild stenosis within these vessels. 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 There is minimal reconstitution of flow seen within M2 and more distal right MCA branch vessels. The left middle cerebral artery is patent without high-grade proximal stenosis. The bilateral anterior cerebral arteries are patent without significant proximal stenosis. Posterior circulation: The intracranial vertebral arteries are patent without significant stenosis, as is the basilar artery. The bilateral posterior cerebral arteries are patent without significant proximal stenosis. A sizable right posterior  communicating artery is present. The a left posterior communicating artery is poorly delineated and may be hypoplastic or absent. Venous sinuses: Within limitations of contrast timing, no convincing thrombus. Anatomic variants: As described Review of the MIP images confirms the above findings CT Brain Perfusion Findings: ASPECTS: 4 CBF (<30%) Volume: 16m (in the right MCA vascular territory) Perfusion (Tmax>6.0s) volume: 147 mL (in the right MCA vascular territory) Mismatch Volume: 1372mInfarction Location:Right MCA vascular territory Findings of a proximal M1 right MCA occlusion and the perfusion results were discussed by telephone at the time of interpretation on 04/17/2019 at 10:38 am to provider Dr. ArLorraine Laxwho verbally acknowledged these results. IMPRESSION: CTA neck: 1. The common and internal carotid arteries are patent within the neck bilaterally. 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 There is likely greater than 70% stenosis at the origin of the right ICA. 30-40% stenosis of the proximal left ICA. 2. The vertebral arteries are patent within the neck bilaterally. The right vertebral artery is dominant with moderate atherosclerotic narrowing at its origin. Duplicated V1 left vertebral artery. There is at least moderate ostial stenosis at the origin of the medial left V1 vertebral artery branch. 3. The partially imaged lung apices demonstrate findings suspicious for interstitial lung disease. Additionally, there are nodular opacities within the right lung apex measuring up to 1.4 cm. Dedicated chest CT is recommended for further evaluation when clinically feasible. CTA head: 1. 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 There is minimal reconstitution of flow within M2 and more distal right MCA branch vessels. 2. No other intracranial large vessel occlusion or proximal high-grade arterial stenosis identified. CT perfusion head: The perfusion software identifies a 10 mL  core infarct within the right MCA vascular territory. However, please note more extensive infarction changes were appreciated on concurrently performed noncontrast head CT. The perfusion software detects a 147 mL region of hypoperfused parenchyma within the right MCA vascular territory. Reported mismatch 137 mL. Electronically Signed   By: KyKellie SimmeringO   On: 04/17/2019 11:04   CT HEAD WO CONTRAST  Result Date: 04/22/2019 CLINICAL DATA:  Stroke, follow-up. EXAM: CT HEAD WITHOUT CONTRAST TECHNIQUE: Contiguous axial images were obtained from the base of the skull through the vertex without intravenous contrast. COMPARISON:  MRI/MRA head 04/19/2019, head CT 04/17/2019 FINDINGS: Brain: A large, now early subacute, infarct involving much of the right MCA vascular territory has not significantly changed in extent as compared to prior MRI 04/19/2019. There is involvement of the cortical and subcortical brain, right basal ganglia, thalamus as well as internal and  external capsules. Redemonstrated petechial hemorrhage within portions of the infarct territory. There is regional mass effect with partial effacement of the right lateral ventricle. No midline shift. No extra-axial fluid collection. No evidence of intracranial mass. Small infarcts within the left hemispheric white matter better appreciated on prior MRI. Vascular: No hyperdense vessel.  Atherosclerotic calcifications. Skull: Normal. Negative for fracture or focal lesion. Sinuses/Orbits: Visualized orbits demonstrate no acute abnormality. Mild bilateral maxillary sinus mucosal thickening. No significant mastoid effusion. IMPRESSION: A large, now early subacute, infarct involving much of the right MCA vascular territory has not significantly changed in extent. Redemonstrated associated petechial hemorrhage. Regional mass effect with partial effacement of the right lateral ventricle. No midline shift. Small infarcts within the left hemispheric white matter were  better appreciated on prior MRI 04/19/2019. Electronically Signed   By: Kellie Simmering DO   On: 04/22/2019 07:36   CT HEAD WO CONTRAST  Result Date: 04/17/2019 CLINICAL DATA:  Follow-up intervention for right MCA occlusion. EXAM: CT HEAD WITHOUT CONTRAST TECHNIQUE: Contiguous axial images were obtained from the base of the skull through the vertex without intravenous contrast. COMPARISON:  Interventional images same day.  Head CT same day. FINDINGS: Brain: In the region the small core infarction in the lateral temporal lobe and the penumbra of the majority of the right middle cerebral artery cortical territory, there is brain swelling with effacement of the sulci and areas of patchy low-density as well as some areas of increased attenuation. Low-density present within the right basal ganglia. The appearance of the increased attenuation is more consistent with post procedural contrast staining than intraparenchymal hemorrhage. Hemorrhage is not excluded but not favored. Certainly, there is no frank hematoma. No evidence of midline shift. No hydrocephalus. No extra-axial collection. Vascular: No acute vascular finding primarily. Skull: Negative Sinuses/Orbits: Clear except for mild mucosal thickening. Other: None IMPRESSION: Ischemic changes in the right MCA territory affecting the cortical brain, subcortical brain and right basal ganglia. Areas of low-density as well as areas of indistinct higher density favored to represent contrast staining rather than frank hemorrhage. Low level petechial bleeding is not excluded. There is no discrete hematoma. No mass effect or shift, despite mild-to-moderate swelling in the region of affected brain. Electronically Signed   By: Nelson Chimes M.D.   On: 04/17/2019 20:35   CT ANGIO NECK W OR WO CONTRAST  Result Date: 04/17/2019 CLINICAL DATA:  Stroke, follow-up. EXAM: CT ANGIOGRAPHY HEAD AND NECK CT PERFUSION BRAIN TECHNIQUE: Multidetector CT imaging of the head and neck was  performed using the standard protocol during bolus administration of intravenous contrast. Multiplanar CT image reconstructions and MIPs were obtained to evaluate the vascular anatomy. Carotid stenosis measurements (when applicable) are obtained utilizing NASCET criteria, using the distal internal carotid diameter as the denominator. Multiphase CT imaging of the brain was performed following IV bolus contrast injection. Subsequent parametric perfusion maps were calculated using RAPID software. CONTRAST:  169m OMNIPAQUE IOHEXOL 350 MG/ML SOLN COMPARISON:  Noncontrast head CT performed earlier the same day. FINDINGS: CTA NECK FINDINGS Aortic arch: The origins of the innominate and left common carotid arteries are incompletely imaged. The visualized aortic arch is unremarkable. No significant innominate or proximal subclavian stenosis visualized. Right carotid system: CCA patent to the bifurcation without stenosis. Prominent predominantly calcified plaque within the distal common carotid artery and proximal ICA. There is likely greater than 70% stenosis at the origin of the ICA, although exact quantification of stenosis is difficult due to irregularity of calcified plaque. Distal to  this the ICA is patent within the neck without stenosis. Left carotid system: CCA patent to the bifurcation without stenosis. Calcified plaque within the distal common carotid artery, carotid bifurcation and proximal ICA. Resultant 30-40% narrowing of the proximal ICA as compared to the more distal vessel. Distal to this the ICA is patent within the neck without stenosis. Vertebral arteries: Right vertebral artery dominant. 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 Calcified plaque at the origin of the medial V1 left vertebral artery branch with at least moderate ostial stenosis. More distally the bilateral vertebral arteries are patent within the neck without stenosis. Skeleton: No acute bony abnormality. Cervical spondylosis with C5-C6  and C6-C7 posterior disc osteophytes. Other neck: No neck mass or cervical adenopathy. Upper chest: Paraseptal emphysema within the imaged lung apices. Also within the imaged lung apices but there is interstitial prominence suspicious for interstitial lung disease. There are more nodular opacities within the right upper lobe measuring up to 1.4 cm (series 7, image 56) (series 7, image 99). Review of the MIP images confirms the above findings CTA HEAD FINDINGS Anterior circulation: The intracranial internal carotid arteries are patent with scattered calcified plaque. No more than mild stenosis within these vessels. 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 There is minimal reconstitution of flow seen within M2 and more distal right MCA branch vessels. The left middle cerebral artery is patent without high-grade proximal stenosis. The bilateral anterior cerebral arteries are patent without significant proximal stenosis. Posterior circulation: The intracranial vertebral arteries are patent without significant stenosis, as is the basilar artery. The bilateral posterior cerebral arteries are patent without significant proximal stenosis. A sizable right posterior communicating artery is present. The a left posterior communicating artery is poorly delineated and may be hypoplastic or absent. Venous sinuses: Within limitations of contrast timing, no convincing thrombus. Anatomic variants: As described Review of the MIP images confirms the above findings CT Brain Perfusion Findings: ASPECTS: 4 CBF (<30%) Volume: 49m (in the right MCA vascular territory) Perfusion (Tmax>6.0s) volume: 147 mL (in the right MCA vascular territory) Mismatch Volume: 1367mInfarction Location:Right MCA vascular territory Findings of a proximal M1 right MCA occlusion and the perfusion results were discussed by telephone at the time of interpretation on 04/17/2019 at 10:38 am to provider Dr. ArLorraine Laxwho verbally acknowledged these  results. IMPRESSION: CTA neck: 1. The common and internal carotid arteries are patent within the neck bilaterally. 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 There is likely greater than 70% stenosis at the origin of the right ICA. 30-40% stenosis of the proximal left ICA. 2. The vertebral arteries are patent within the neck bilaterally. The right vertebral artery is dominant with moderate atherosclerotic narrowing at its origin. Duplicated V1 left vertebral artery. There is at least moderate ostial stenosis at the origin of the medial left V1 vertebral artery branch. 3. The partially imaged lung apices demonstrate findings suspicious for interstitial lung disease. Additionally, there are nodular opacities within the right lung apex measuring up to 1.4 cm. Dedicated chest CT is recommended for further evaluation when clinically feasible. CTA head: 1. 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 There is minimal reconstitution of flow within M2 and more distal right MCA branch vessels. 2. No other intracranial large vessel occlusion or proximal high-grade arterial stenosis identified. CT perfusion head: The perfusion software identifies a 10 mL core infarct within the right MCA vascular territory. However, please note more extensive infarction changes were appreciated on concurrently  performed noncontrast head CT. The perfusion software detects a 147 mL region of hypoperfused parenchyma within the right MCA vascular territory. Reported mismatch 137 mL. Electronically Signed   By: Kellie Simmering DO   On: 04/17/2019 11:04   MR ANGIO HEAD WO CONTRAST  Result Date: 04/19/2019 CLINICAL DATA:  Follow-up right MCA occlusion with intervention. EXAM: MRI HEAD WITHOUT CONTRAST MRA HEAD WITHOUT CONTRAST TECHNIQUE: Multiplanar, multiecho pulse sequences of the brain and surrounding structures were obtained without intravenous contrast. Angiographic images of the head were obtained using MRA  technique without contrast. 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 Differences in diffusion appearance is attributed to artifact from petechial hemorrhage. There is progressive swelling with partial effacement of the right lateral ventricle. Midline shift only measures 2 mm. No entrapment. Confluent petechial hemorrhage is seen in the infarcted area. There are a few punctate acute infarcts in the left occipital parietal region, also seen prior. No extra-axial collection. Vascular: Arterial findings below. Skull and upper cervical spine: Negative Sinuses/Orbits: Negative MRA HEAD FINDINGS Motion degraded. Symmetric carotid and vertebral arteries. Dominant left PICA and right AICA. The vertebral, carotid, and basilar arteries are smooth and widely patent. No branch occlusion or aneurysm. Accounting for motion there is symmetric flow in the right MCA. IMPRESSION: 1. 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 There is progressive swelling with up to 2 mm of midline shift. 2. Redemonstrated minimal acute infarct in left hemispheric white matter. 3. Negative motion degraded MRA.  The right MCA is patent. Electronically Signed   By: Monte Fantasia M.D.   On: 04/19/2019 11:50   MR BRAIN WO CONTRAST  Result Date: 04/19/2019 CLINICAL DATA:  Follow-up right MCA occlusion with intervention. EXAM: MRI HEAD WITHOUT CONTRAST MRA HEAD WITHOUT CONTRAST TECHNIQUE: Multiplanar, multiecho pulse sequences of the brain and surrounding structures were obtained without intravenous contrast. Angiographic images of the head were obtained using MRA technique without contrast. COMPARISON:  Brain MRI from 2 days ago FINDINGS: MRI HEAD FINDINGS Brain: Large acute infarct involving the majority of the right MCA  territory, most confluent in the inferior division and at the striatum. Differences in diffusion appearance is attributed to artifact from petechial hemorrhage. There is progressive swelling with partial effacement of the right lateral ventricle. Midline shift only measures 2 mm. No entrapment. Confluent petechial hemorrhage is seen in the infarcted area. There are a few punctate acute infarcts in the left occipital parietal region, also seen prior. No extra-axial collection. Vascular: Arterial findings below. Skull and upper cervical spine: Negative Sinuses/Orbits: Negative MRA HEAD FINDINGS Motion degraded. Symmetric carotid and vertebral arteries. Dominant left PICA and right AICA. The vertebral, carotid, and basilar arteries are smooth and widely patent. No branch occlusion or aneurysm. Accounting for motion there is symmetric flow in the right MCA. IMPRESSION: 1. 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 There is progressive swelling with up to 2 mm of midline shift. 2. Redemonstrated minimal acute infarct in left hemispheric white matter. 3. Negative motion degraded MRA.  The right MCA is patent. Electronically Signed   By: Monte Fantasia M.D.   On: 04/19/2019 11:50   MR BRAIN WO CONTRAST  Result Date: 04/17/2019 CLINICAL DATA:  Focal neuro deficit, greater than 6 hours, stroke suspected.  EXAM: MRI HEAD WITHOUT CONTRAST TECHNIQUE: Multiplanar, multiecho pulse sequences of the brain and surrounding structures were obtained without intravenous contrast. COMPARISON:  Noncontrast CT head, CT angiogram head/neck and CT perfusion performed earlier the same day 04/17/2019 FINDINGS: Brain: A limited protocol brain MRI was performed at the ordering provider's request consisting of only axial diffusion-weighted imaging and axial T2 FLAIR imaging. There is extensive multifocal restricted diffusion within the right MCA vascular territory, consistent with  acute ischemic changes and involving portions of the right frontal, parietal, temporal and occipital lobes as well as right insular cortex, posterior limb of right internal capsule, right lentiform nucleus, right caudate body and right corona radiata. Of note, this includes acute ischemic change within the right motor strip. The largest region of diffusion weighted signal abnormality within the right temporal lobe measures 7.8 x 3.0 cm in transaxial dimensions (series 4, image 23). There is T2/FLAIR hyperintensity which corresponds with some, but not all, of the acute ischemic change. For instance, there is little to no T2/FLAIR hyperintensity corresponding with the ischemic changes within the right corona radiata, basal ganglia and right insula at this time. Punctate acute cortical infarct within the left parietooccipital lobe (series 4, image 33). Additional probable punctate acute infarcts more superiorly within the left parietal lobe (series 4, image 35) and within the lateral left occipital lobe (series 4, image 24). Vascular: Poorly assessed on the acquired sequences. Skull and upper cervical spine: No evidence of focal marrow lesion on the acquired sequences. Sinuses/Orbits: Visualized orbits demonstrate no acute abnormality. Minimal ethmoid sinus mucosal thickening. No significant mastoid effusion. IMPRESSION: Extensive restricted diffusion consistent with acute ischemia involving the right MCA vascular territory. There is T2/FLAIR hyperintensity which corresponds with some, but not all, of the acute ischemic changes as described. Additional punctate acute infarcts within the left parietal and occipital lobes. Electronically Signed   By: Kellie Simmering DO   On: 04/17/2019 12:50   IR CT Head Ltd  Result Date: 04/17/2019 INDICATION: New onset of left-sided weakness with right gaze deviation and dysarthria.  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  EXAM: 1. EMERGENT  LARGE VESSEL OCCLUSION THROMBOLYSIS (anterior CIRCULATION)  COMPARISON:  CT angiogram of the head and neck of April 17, 2019.  MEDICATIONS: Ancef 2 g IV antibiotic was administered within 1 hour of the procedure.  ANESTHESIA/SEDATION: General anesthesia.  CONTRAST:  Isovue 300 approximately 65 mL.  FLUOROSCOPY TIME:  Fluoroscopy Time: 41 minutes 18 seconds (1531 mGy).  COMPLICATIONS: None immediate.  TECHNIQUE: Following a full explanation of the procedure along with the potential associated complications, an informed witnessed consent was obtained from the patient's wife and daughter. The risks of intracranial hemorrhage of 10%, worsening neurological deficit, ventilator dependency, death and inability to revascularize were all reviewed in detail with the patient's wife and daughter.  The patient was then put under general anesthesia by the Department of Anesthesiology at [facility].  The right groin was prepped and draped in the usual sterile fashion. Thereafter using modified Seldinger technique, transfemoral access into the right common femoral artery was obtained without difficulty. Over a 0.035 inch guidewire an 8 [place] Pinnacle sheath was inserted. Through this, and also over a 0.035 inch guidewire a 5 [place] JB 1 catheter was advanced to the aortic arch region and selectively positioned in the innominate artery and the right common carotid artery.  FINDINGS: The innominate artery arteriogram demonstrates the origin of the right subclavian artery and the right common  carotid artery to be widely patent.  The right common carotid arteriogram demonstrates the right external carotid artery and its major branches to be widely patent.  The right internal carotid artery at the bulb to the cranial skull base demonstrates wide patency with mild FMD-like changes involving the mid cervical right ICA.  The petrous, cavernous and the supraclinoid segments are widely patent.  A right posterior  communicating artery is seen opacifying the right posterior cerebral distribution.  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 The right anterior cerebral artery opacifies into the capillary and venous phases.  PROCEDURE: The diagnostic JB 1 catheter in right common carotid artery was exchanged over an 8 [place] 300 cm Rosen exchange guidewire for an 087 balloon guide catheter which had been prepped with 50% contrast and 50% heparinized saline infusion. This was advanced to the right common carotid artery just proximal to the origin of the right internal carotid artery. The guidewire was removed. Good aspiration was obtained from the hub of the balloon guide catheter.  Gentle contrast injection demonstrated no evidence of spasms or of intraluminal filling defects. Over a 0.014 inch standard Synchro micro guidewire with a J configuration, the combination of the 021 Trevo ProVue microcatheter inside of a 6 [place] Catalyst 132 cm guide catheter was advanced to the distal end of the balloon guide catheter.  With the micro guidewire leading with a J-tip configuration to avoid dissections or inducing spasm, the combination was navigated without difficulty to the supraclinoid right ICA.  The occluded right middle cerebral artery was entered with a micro guidewire which was advanced to the M3 regions of the inferior division of the right middle cerebral artery followed by the microcatheter. The guidewire was removed. Good aspiration was obtained from the hub of the microcatheter. Gentle contrast injection demonstrated safe position of tip of the microcatheter. This was then connected to continuous heparinized saline infusion.  A 4 mm x 40 mm Solitaire X retrieval device was then advanced to the distal end of the microcatheter. The O ring on the delivery microcatheter was then loosened. With slight forward gentle traction with the right hand on the delivery micro guidewire with left  hand the delivery microcatheter was retrieved unsheathing the retrieval device.  The Catalyst 6 [place] guide catheter was advanced to the occluded right middle cerebral artery. With proximal flow arrest obtained by inflating the balloon in the right internal carotid artery, and after constant aspiration using a Penumbra aspiration device at the hub of the 6 [place] Catalyst guide catheter, and with a 60 mL syringe at the hub of the balloon guide catheter, the combination of the retrieval device, the microcatheter, and the 6 [place] Catalyst guide were retrieved and removed as constant aspiration was applied at the hub of the balloon guide catheter.  Proximal flow arrest was reversed. A control arteriogram performed through the balloon guide catheter which had been advanced into the mid right internal carotid artery demonstrated complete opacification of the right middle cerebral artery and the inferior division. However, the superior division remained occluded.  A second pass was then made again using the above combination. With the micro guidewire leading, access into the M3 region of the superior division was obtained with a microcatheter. The micro guidewire was removed. Good aspiration was established at the hub of the microcatheter. A gentle control arteriogram again demonstrates safe positioning of the tip of the microcatheter which was then connected to continuous heparinized saline infusion. Again  with constant flow arrest in the right internal carotid artery, and aspiration at the hub of the Catalyst guide catheter using the Penumbra aspiration device at the origin of the superior division for 2 minutes, and with a 60 mL syringe at the hub of the balloon guide catheter, the combination of the retrieval device, the microcatheter and the 6 [place] Catalyst guide catheter were retrieved and removed. Following reversal of flow arrest, control arteriogram performed through the balloon guide catheter in the right  internal carotid artery now demonstrated revascularization of the superior division and also further improved caliber of the inferior division.  There continued to be a very small branch extending into the anterior temporal region which was occluded in the M2 region. Also noted was a distal M3 M4 junction branch occlusion of a parietal branch of the inferior division.  A TICI 2C revascularization had been achieved.  The right anterior cerebral artery remained widely patent with cross-filling via the anterior communicating artery of the left anterior cerebral A2 segment and distally.  The posterior communicating artery remain widely patent.  Moderate spasm in the distal cervical right ICA, and the superior division of the right middle cerebral artery responded to 2 aliquots of 25 mcg of nitroglycerin intra-arterially.  A final control arteriogram performed through the balloon guide catheter in the right common carotid artery demonstrated significantly improved caliber of the right internal carotid artery, with maintenance of a TICI 2c revascularization of the right middle cerebral artery distribution.  The balloon guide catheter was removed. The right groin 8 [place] Pinnacle sheath was then removed with manual compression in the right groin. Distal pulses remained Dopplerable in the dorsalis pedis, and the posterior tibial regions bilaterally unchanged.  A flat panel CT of the brain demonstrated heterogeneous hyper attenuation in the right temporal lobe region anteriorly probably representing contrast stain versus less likely hemorrhagic conversion of infarct tissue.  There was no mass-effect noted on the right lateral ventricle. Patient was then transferred to the ICU unit for post thrombectomy management. Patient was left intubated on account of his COVID status.  IMPRESSION: Endovascular near complete revascularization of the right middle cerebral artery territory with 2 passes with the 4 mm x 40 mm  Solitaire X retrieval device and Penumbra aspiration achieving a TICI 2c revascularization.  PLAN: Follow-up as per referring MD.   Electronically Signed   By: Luanne Bras M.D.   On: 04/18/2019 10:42   CT CEREBRAL PERFUSION W CONTRAST  Result Date: 04/17/2019 CLINICAL DATA:  Stroke, follow-up. EXAM: CT ANGIOGRAPHY HEAD AND NECK CT PERFUSION BRAIN TECHNIQUE: Multidetector CT imaging of the head and neck was performed using the standard protocol during bolus administration of intravenous contrast. Multiplanar CT image reconstructions and MIPs were obtained to evaluate the vascular anatomy. Carotid stenosis measurements (when applicable) are obtained utilizing NASCET criteria, using the distal internal carotid diameter as the denominator. Multiphase CT imaging of the brain was performed following IV bolus contrast injection. Subsequent parametric perfusion maps were calculated using RAPID software. CONTRAST:  164m OMNIPAQUE IOHEXOL 350 MG/ML SOLN COMPARISON:  Noncontrast head CT performed earlier the same day. FINDINGS: CTA NECK FINDINGS Aortic arch: The origins of the innominate and left common carotid arteries are incompletely imaged. The visualized aortic arch is unremarkable. No significant innominate or proximal subclavian stenosis visualized. Right carotid system: CCA patent to the bifurcation without stenosis. Prominent predominantly calcified plaque within the distal common carotid artery and proximal ICA. There is likely greater than 70% stenosis at the  origin of the ICA, although exact quantification of stenosis is difficult due to irregularity of calcified plaque. Distal to this the ICA is patent within the neck without stenosis. Left carotid system: CCA patent to the bifurcation without stenosis. Calcified plaque within the distal common carotid artery, carotid bifurcation and proximal ICA. Resultant 30-40% narrowing of the proximal ICA as compared to the more distal vessel. Distal to this  the ICA is patent within the neck without stenosis. Vertebral arteries: Right vertebral artery dominant. 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 Calcified plaque at the origin of the medial V1 left vertebral artery branch with at least moderate ostial stenosis. More distally the bilateral vertebral arteries are patent within the neck without stenosis. Skeleton: No acute bony abnormality. Cervical spondylosis with C5-C6 and C6-C7 posterior disc osteophytes. Other neck: No neck mass or cervical adenopathy. Upper chest: Paraseptal emphysema within the imaged lung apices. Also within the imaged lung apices but there is interstitial prominence suspicious for interstitial lung disease. There are more nodular opacities within the right upper lobe measuring up to 1.4 cm (series 7, image 56) (series 7, image 99). Review of the MIP images confirms the above findings CTA HEAD FINDINGS Anterior circulation: The intracranial internal carotid arteries are patent with scattered calcified plaque. No more than mild stenosis within these vessels. 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 There is minimal reconstitution of flow seen within M2 and more distal right MCA branch vessels. The left middle cerebral artery is patent without high-grade proximal stenosis. The bilateral anterior cerebral arteries are patent without significant proximal stenosis. Posterior circulation: The intracranial vertebral arteries are patent without significant stenosis, as is the basilar artery. The bilateral posterior cerebral arteries are patent without significant proximal stenosis. A sizable right posterior communicating artery is present. The a left posterior communicating artery is poorly delineated and may be hypoplastic or absent. Venous sinuses: Within limitations of contrast timing, no convincing thrombus. Anatomic variants: As described Review of the MIP images confirms the above findings CT Brain Perfusion Findings: ASPECTS:  4 CBF (<30%) Volume: 45m (in the right MCA vascular territory) Perfusion (Tmax>6.0s) volume: 147 mL (in the right MCA vascular territory) Mismatch Volume: 1339mInfarction Location:Right MCA vascular territory Findings of a proximal M1 right MCA occlusion and the perfusion results were discussed by telephone at the time of interpretation on 04/17/2019 at 10:38 am to provider Dr. ArLorraine Laxwho verbally acknowledged these results. IMPRESSION: CTA neck: 1. The common and internal carotid arteries are patent within the neck bilaterally. 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 There is likely greater than 70% stenosis at the origin of the right ICA. 30-40% stenosis of the proximal left ICA. 2. The vertebral arteries are patent within the neck bilaterally. The right vertebral artery is dominant with moderate atherosclerotic narrowing at its origin. Duplicated V1 left vertebral artery. There is at least moderate ostial stenosis at the origin of the medial left V1 vertebral artery branch. 3. The partially imaged lung apices demonstrate findings suspicious for interstitial lung disease. Additionally, there are nodular opacities within the right lung apex measuring up to 1.4 cm. Dedicated chest CT is recommended for further evaluation when clinically feasible. CTA head: 1. 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 There is minimal reconstitution of flow within M2 and more distal right MCA branch vessels. 2. No other intracranial large vessel occlusion or proximal high-grade arterial stenosis identified. CT perfusion head: The perfusion software identifies a 10 mL  core infarct within the right MCA vascular territory. However, please note more extensive infarction changes were appreciated on concurrently performed noncontrast head CT. The perfusion software detects a 147 mL region of hypoperfused parenchyma within the right MCA vascular territory. Reported mismatch 137 mL. Electronically  Signed   By: Kellie Simmering DO   On: 04/17/2019 11:04   DG Chest Port 1 View  Result Date: 04/21/2019 CLINICAL DATA:  Shortness of breath. EXAM: PORTABLE CHEST 1 VIEW COMPARISON:  04/18/2019 FINDINGS: The endotracheal tube has been removed. The patient is rotated to the right with grossly unchanged cardiomediastinal silhouette. Lung volumes are lower than on the prior study with increased elevation of the right hemidiaphragm. Mid and lower lung airspace opacities bilaterally have slightly increased in the right base and slightly improved in the left base. No sizable pleural effusion or pneumothorax is identified. IMPRESSION: Interval extubation. Lower lung volumes with mildly worsened aeration in the right lung base and mildly improved aeration in the left lung base. Electronically Signed   By: Logan Bores M.D.   On: 04/21/2019 08:54   DG Chest Port 1 View  Result Date: 04/18/2019 CLINICAL DATA:  Acute respiratory failure EXAM: PORTABLE CHEST 1 VIEW COMPARISON:  Radiograph 04/17/2019 FINDINGS: *Endotracheal tube in the mid trachea, 5.5 cm from the carina. *Telemetry leads overlie the chest. Slight improvement in the bilateral airspace disease most pronounced in the left base and periphery and right mid lung. No new areas of airspace disease. No pneumothorax or visible effusion the portion of the costophrenic sulci on the left is collimated. Cardiomediastinal contours are stable with a calcified aorta. No acute osseous or soft tissue abnormality. IMPRESSION: Stable satisfactory positioning of the endotracheal tube. Slight interval improvement of bilateral airspace disease. Electronically Signed   By: Lovena Le M.D.   On: 04/18/2019 06:15   DG Chest Port 1 View  Result Date: 04/17/2019 CLINICAL DATA:  COVID-19. Intubation. EXAM: PORTABLE CHEST 1 VIEW COMPARISON:  Radiograph yesterday. FINDINGS: Endotracheal tube tip 5.8 cm from the carina. Probable esophageal temperature probe with tip at the level of  the clavicular heads. No definite enteric tube is visualized. Patchy bilateral airspace disease within both lungs, slight worsening in the right midlung zone from prior. Unchanged heart size and mediastinal contours. No pneumothorax or large pleural effusion. No acute osseous abnormalities are seen. IMPRESSION: 1. Bilateral pneumonia, with slight worsening in the right mid lung since yesterday. 2. Endotracheal tube tip 5.8 cm from the carina. Probable esophageal temperature probe with tip at the clavicular heads. Electronically Signed   By: Keith Rake M.D.   On: 04/17/2019 16:45   DG Chest Portable 1 View  Result Date: 04/16/2019 CLINICAL DATA:  Cough and fever. New weakness. EXAM: PORTABLE CHEST 1 VIEW COMPARISON:  08/11/2009 FINDINGS: Normal sized heart. Interval diffuse prominence of the interstitial markings and patchy opacities in the mid and lower lung zones bilaterally. No visible pleural fluid. Diffuse osteo IMPRESSION: Interval probable bilateral pneumonia and pneumonitis. Electronically Signed   By: Claudie Revering M.D.   On: 04/16/2019 14:28   IR PERCUTANEOUS ART THROMBECTOMY/INFUSION INTRACRANIAL INC DIAG ANGIO  Result Date: 04/19/2019 INDICATION: New onset of left-sided weakness with right gaze deviation and dysarthria. 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 EXAM: 1. EMERGENT LARGE VESSEL OCCLUSION THROMBOLYSIS (anterior CIRCULATION) COMPARISON:  CT angiogram of the head and neck of April 17, 2019. MEDICATIONS: Ancef 2 g IV antibiotic was administered within 1 hour of the procedure. ANESTHESIA/SEDATION: General  anesthesia. CONTRAST:  Isovue 300 approximately 65 mL. FLUOROSCOPY TIME:  Fluoroscopy Time: 41 minutes 18 seconds (1531 mGy). COMPLICATIONS: None immediate. TECHNIQUE: Following a full explanation of the procedure along with the potential associated complications, an informed witnessed consent was obtained from the patient's wife and  daughter. The risks of intracranial hemorrhage of 10%, worsening neurological deficit, ventilator dependency, death and inability to revascularize were all reviewed in detail with the patient's wife and daughter. The patient was then put under general anesthesia by the Department of Anesthesiology at [facility]. The right groin was prepped and draped in the usual sterile fashion. Thereafter using modified Seldinger technique, transfemoral access into the right common femoral artery was obtained without difficulty. Over a 0.035 inch guidewire an 8 [place] Pinnacle sheath was inserted. Through this, and also over a 0.035 inch guidewire a 5 [place] JB 1 catheter was advanced to the aortic arch region and selectively positioned in the innominate artery and the right common carotid artery. FINDINGS: The innominate artery arteriogram demonstrates the origin of the right subclavian artery and the right common carotid artery to be widely patent. The right common carotid arteriogram demonstrates the right external carotid artery and its major branches to be widely patent. The right internal carotid artery at the bulb to the cranial skull base demonstrates wide patency with mild FMD-like changes involving the mid cervical right ICA. The petrous, cavernous and the supraclinoid segments are widely patent. A right posterior communicating artery is seen opacifying the right posterior cerebral distribution. 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 The right anterior cerebral artery opacifies into the capillary and venous phases. PROCEDURE: The diagnostic JB 1 catheter in right common carotid artery was exchanged over an 8 [place] 300 cm Rosen exchange guidewire for an 087 balloon guide catheter which had been prepped with 50% contrast and 50% heparinized saline infusion. This was advanced to the right common carotid artery just proximal to the origin of the right internal carotid artery.  The guidewire was removed. Good aspiration was obtained from the hub of the balloon guide catheter. Gentle contrast injection demonstrated no evidence of spasms or of intraluminal filling defects. Over a 0.014 inch standard Synchro micro guidewire with a J configuration, the combination of the 021 Trevo ProVue microcatheter inside of a 6 [place] Catalyst 132 cm guide catheter was advanced to the distal end of the balloon guide catheter. With the micro guidewire leading with a J-tip configuration to avoid dissections or inducing spasm, the combination was navigated without difficulty to the supraclinoid right ICA. The occluded right middle cerebral artery was entered with a micro guidewire which was advanced to the M3 regions of the inferior division of the right middle cerebral artery followed by the microcatheter. The guidewire was removed. Good aspiration was obtained from the hub of the microcatheter. Gentle contrast injection demonstrated safe position of tip of the microcatheter. This was then connected to continuous heparinized saline infusion. A 4 mm x 40 mm Solitaire X retrieval device was then advanced to the distal end of the microcatheter. The O ring on the delivery microcatheter was then loosened. With slight forward gentle traction with the right hand on the delivery micro guidewire with left hand the delivery microcatheter was retrieved unsheathing the retrieval device. The Catalyst 6 [place] guide catheter was advanced to the occluded right middle cerebral artery. With proximal flow arrest obtained by inflating the balloon in the right internal carotid artery, and after constant aspiration  using a Penumbra aspiration device at the hub of the 6 [place] Catalyst guide catheter, and with a 60 mL syringe at the hub of the balloon guide catheter, the combination of the retrieval device, the microcatheter, and the 6 [place] Catalyst guide were retrieved and removed as constant aspiration was applied at the hub  of the balloon guide catheter. Proximal flow arrest was reversed. A control arteriogram performed through the balloon guide catheter which had been advanced into the mid right internal carotid artery demonstrated complete opacification of the right middle cerebral artery and the inferior division. However, the superior division remained occluded. A second pass was then made again using the above combination. With the micro guidewire leading, access into the M3 region of the superior division was obtained with a microcatheter. The micro guidewire was removed. Good aspiration was established at the hub of the microcatheter. A gentle control arteriogram again demonstrates safe positioning of the tip of the microcatheter which was then connected to continuous heparinized saline infusion. Again with constant flow arrest in the right internal carotid artery, and aspiration at the hub of the Catalyst guide catheter using the Penumbra aspiration device at the origin of the superior division for 2 minutes, and with a 60 mL syringe at the hub of the balloon guide catheter, the combination of the retrieval device, the microcatheter and the 6 [place] Catalyst guide catheter were retrieved and removed. Following reversal of flow arrest, control arteriogram performed through the balloon guide catheter in the right internal carotid artery now demonstrated revascularization of the superior division and also further improved caliber of the inferior division. There continued to be a very small branch extending into the anterior temporal region which was occluded in the M2 region. Also noted was a distal M3 M4 junction branch occlusion of a parietal branch of the inferior division. A TICI 2C revascularization had been achieved. The right anterior cerebral artery remained widely patent with cross-filling via the anterior communicating artery of the left anterior cerebral A2 segment and distally. The posterior communicating artery remain  widely patent. Moderate spasm in the distal cervical right ICA, and the superior division of the right middle cerebral artery responded to 2 aliquots of 25 mcg of nitroglycerin intra-arterially. A final control arteriogram performed through the balloon guide catheter in the right common carotid artery demonstrated significantly improved caliber of the right internal carotid artery, with maintenance of a TICI 2c revascularization of the right middle cerebral artery distribution. The balloon guide catheter was removed. The right groin 8 [place] Pinnacle sheath was then removed with manual compression in the right groin. Distal pulses remained Dopplerable in the dorsalis pedis, and the posterior tibial regions bilaterally unchanged. A flat panel CT of the brain demonstrated heterogeneous hyper attenuation in the right temporal lobe region anteriorly probably representing contrast stain versus less likely hemorrhagic conversion of infarct tissue. There was no mass-effect noted on the right lateral ventricle. Patient was then transferred to the ICU unit for post thrombectomy management. Patient was left intubated on account of his COVID status. IMPRESSION: Endovascular near complete revascularization of the right middle cerebral artery territory with 2 passes with the 4 mm x 40 mm Solitaire X retrieval device and Penumbra aspiration achieving a TICI 2c revascularization. PLAN: Follow-up as per referring MD. Electronically Signed   By: Luanne Bras M.D.   On: 04/18/2019 10:42   CT HEAD CODE STROKE WO CONTRAST  Result Date: 04/17/2019 CLINICAL DATA:  Code stroke. Left-sided facial droop/weakness, last known well  noon yesterday EXAM: CT HEAD WITHOUT CONTRAST TECHNIQUE: Contiguous axial images were obtained from the base of the skull through the vertex without intravenous contrast. COMPARISON:  No pertinent prior studies available for comparison. 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 Subtle petechial hemorrhage is questioned within a region of infarction within the anterior right frontal lobe (series 2, image 19). No significant mass effect or midline shift. No extra-axial fluid collection. Background mild ill-defined hypoattenuation within the cerebral white matter is nonspecific, but consistent with chronic small vessel ischemic disease. Mild generalized parenchymal atrophy. Vascular: Abnormal hyperdensity of the M1 right MCA likely reflecting thrombus. Atherosclerotic calcifications. Skull: No calvarial fracture or suspicious osseous lesion. Sinuses/Orbits: Visualized orbits demonstrate no acute abnormality. Mild paranasal sinus mucosal thickening. No significant mastoid effusion. ASPECTS (Greasy Stroke Program Early CT Score) - Ganglionic level infarction (caudate, lentiform nuclei, internal capsule, insula, M1-M3 cortex): 4 (points subtracted for M2, M3 and insula) - Supraganglionic infarction (M4-M6 cortex): 0 Total score (0-10 with 10 being normal): 4 These results were communicated to Dr. Lorraine Lax At 10:38 amon 2/10/2021by text page via the [facility] messaging system. IMPRESSION: 1. 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 ASPECTS 4. Subtle petechial hemorrhage is questioned within an anterior right frontal lobe infarct site. No significant mass effect. 2. Hyperdensity of the M1 right MCA likely reflecting thrombus. 3. Mild generalized parenchymal atrophy and chronic small vessel ischemic disease. Electronically Signed   By: Kellie Simmering DO   On: 04/17/2019 10:38   VAS [place] LOWER EXTREMITY VENOUS (DVT)  Result Date: 04/18/2019  Lower Venous DVTStudy Indications: Elevated Ddimer.  Risk Factors: COVID 19 positive. Limitations: Bandages, open wound and  patient positioning. Comparison Study: No prior studies. Performing Technologist: Oliver Hum RVT  Examination Guidelines: A complete evaluation includes B-mode imaging, spectral Doppler, color Doppler, and power Doppler as needed of all accessible portions of each vessel. Bilateral testing is considered an integral part of a complete examination. Limited examinations for reoccurring indications may be performed as noted. The reflux portion of the exam is performed with the patient in reverse Trendelenburg.  +---------+---------------+---------+-----------+----------+--------------+ RIGHT    CompressibilityPhasicitySpontaneityPropertiesThrombus Aging +---------+---------------+---------+-----------+----------+--------------+ CFV                                                   Not visualized +---------+---------------+---------+-----------+----------+--------------+ SFJ                                                   Not visualized +---------+---------------+---------+-----------+----------+--------------+ FV Prox  Full           Yes      Yes                                 +---------+---------------+---------+-----------+----------+--------------+ FV Mid   Full                                                        +---------+---------------+---------+-----------+----------+--------------+  FV DistalFull                                                        +---------+---------------+---------+-----------+----------+--------------+ PFV      Full                                                        +---------+---------------+---------+-----------+----------+--------------+ POP      Full           Yes      Yes                                 +---------+---------------+---------+-----------+----------+--------------+ PTV      Full                                                         +---------+---------------+---------+-----------+----------+--------------+ PERO     Full                                                        +---------+---------------+---------+-----------+----------+--------------+ Unable to visualize the CFV, SFJ due to bandages.  +---------+---------------+---------+-----------+----------+--------------+ LEFT     CompressibilityPhasicitySpontaneityPropertiesThrombus Aging +---------+---------------+---------+-----------+----------+--------------+ CFV      Full           Yes      Yes                                 +---------+---------------+---------+-----------+----------+--------------+ SFJ      Full                                                        +---------+---------------+---------+-----------+----------+--------------+ FV Prox  Full                                                        +---------+---------------+---------+-----------+----------+--------------+ FV Mid   Full                                                        +---------+---------------+---------+-----------+----------+--------------+ FV DistalFull                                                        +---------+---------------+---------+-----------+----------+--------------+  PFV      Full                                                        +---------+---------------+---------+-----------+----------+--------------+ POP      Full           Yes      Yes                                 +---------+---------------+---------+-----------+----------+--------------+ PTV      Full                                                        +---------+---------------+---------+-----------+----------+--------------+ PERO     Full                                                        +---------+---------------+---------+-----------+----------+--------------+ Gastroc  None                                         Acute           +---------+---------------+---------+-----------+----------+--------------+     Summary: RIGHT: - There is no evidence of deep vein thrombosis in the lower extremity.  - No cystic structure found in the popliteal fossa.  LEFT: - Findings consistent with acute deep vein thrombosis involving the left gastrocnemius veins. - No cystic structure found in the popliteal fossa.  *See table(s) above for measurements and observations. Electronically signed by Servando Snare MD on 04/18/2019 at 6:33:22 PM.    Final    ECHOCARDIOGRAM LIMITED  Result Date: 04/18/2019    ECHOCARDIOGRAM LIMITED REPORT   Patient Name:   JOVONTAE BANKO Date of Exam: 04/17/2019 Medical Rec #:  [id number]       Height:       68.0 in Accession #:    [id number]      Weight:       180.0 lb Date of Birth:  Sep 30, 1940       BSA:          1.95 m Patient Age:    6 years        BP:           141/51 mmHg Patient Gender: M               HR:           57 bpm. Exam Location:  Inpatient Procedure: Limited Echo and Cardiac Doppler Indications:    Stroke 434.91 / I163.9  History:        Patient has no prior history of Echocardiogram examinations.                 Signs/Symptoms:Chest Pain; Risk Factors:Hypertension. Pneumonia                 due to COVID-19 virus.  Sonographer:    Jaquita Folds Referring Phys: [place]  1. Left ventricular ejection fraction, by estimation, is 55 to 60%. The left ventricle has normal function. The left ventrical has no regional wall motion abnormalities. Left ventricular diastolic parameters are indeterminate.  2. Right ventricular systolic function is normal. The right ventricular size is normal. There is normal pulmonary artery systolic pressure.  3. The mitral valve is normal in structure and function. trivial mitral valve regurgitation. No evidence of mitral stenosis.  4. The aortic valve is normal in structure and function. Aortic valve regurgitation is not visualized. No aortic stenosis is present.  FINDINGS  Left Ventricle: Left ventricular ejection fraction, by estimation, is 55 to 60%. The left ventricle has normal function. The left ventricle has no regional wall motion abnormalities. There is no left ventricular hypertrophy. Right Ventricle: The right ventricular size is normal. No increase in right ventricular wall thickness. Right ventricular systolic function is normal. There is normal pulmonary artery systolic pressure. The tricuspid regurgitant velocity is 1.86 m/s, and  with an assumed right atrial pressure of 3 mmHg, the estimated right ventricular systolic pressure is 35.5 mmHg. Mitral Valve: The mitral valve is normal in structure and function. Trivial mitral valve regurgitation. No evidence of mitral valve stenosis. Tricuspid Valve: The tricuspid valve is grossly normal. Tricuspid valve regurgitation is mild. Aortic Valve: The aortic valve is normal in structure and function. Aortic valve regurgitation is not visualized. No aortic stenosis is present. Aorta: The aortic root, ascending aorta and aortic arch are all structurally normal, with no evidence of dilitation or obstruction.  LEFT VENTRICLE PLAX 2D LVIDd:         4.60 cm  Diastology LVIDs:         3.60 cm  LV e' lateral:   8.04 cm/s LV PW:         0.90 cm  LV E/e' lateral: 10.1 LV IVS:        0.90 cm  LV e' medial:    5.91 cm/s LVOT diam:     2.00 cm  LV E/e' medial:  13.8 LV SV:         55.61 ml LV SV Index:   21.50 LVOT Area:     3.14 cm  LEFT ATRIUM             Index LA diam:        4.30 cm 2.20 cm/m LA Vol (A2C):   50.7 ml 25.94 ml/m LA Vol (A4C):   43.8 ml 22.41 ml/m LA Biplane Vol: 48.7 ml 24.92 ml/m  AORTIC VALVE LVOT Vmax:   79.40 cm/s LVOT Vmean:  57.900 cm/s LVOT VTI:    0.177 m  AORTA Ao Root diam: 2.70 cm MITRAL VALVE                        TRICUSPID VALVE MV Area (PHT): 2.50 cm             TR Peak grad:   13.8 mmHg MV Decel Time: 303 msec             TR Vmax:        186.00 cm/s MV E velocity: 81.60 cm/s 103 cm/s MV A  velocity: 74.40 cm/s 70.3 cm/s SHUNTS MV E/A ratio:  1.10       1.5       Systemic VTI:  0.18 m  Systemic Diam: 2.00 cm Mertie Moores MD Electronically signed by Mertie Moores MD Signature Date/Time: 04/18/2019/5:07:36 PM    Final

## 2019-04-22 NOTE — Progress Notes (Signed)
  Speech Language Pathology Treatment: Dysphagia  Patient Details Name: Henry [name] MRN: [id number] DOB: 1940/06/25 Today's Date: 04/22/2019 Time: [id number] SLP Time Calculation (min) (ACUTE ONLY): 11 min  Assessment / Plan / Recommendation Clinical Impression  Pt much more alert today than when last seen by SLP.  He just finished session with PT and was seated in recliner.  Pt continues with strong right gaze preference; with verbal/tactile cues he is able to bring his gaze to midline.  Pt self-fed bites of applesauce with moderate multimodal cues needed to slow down/cease impulsivity, attend to spoon and bring to right side of mouth.  Pt required verbal prompts to attend to left sided spillage and oral residue. There is poor insight into deficits, characteristic of R CVA.  Trials of thin liquids continued to evoke immediate and consistent coughing, concerning for aspiration.  Pt will likely benefit from instrumental swallow study this week. Continue dysphagia 1, nectar thick liquids for now with full supervision to assist with feeding and to check for oral pocketing left cheek.   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  Was outside of window for TPA.  Went to interventional radiology for clot extraction, returned to ICU on ventilator MRI shows Extensive restricted diffusion consistent with acute ischemia involving the right MCA vascular territory. There is T2/FLAIR hyperintensity which corresponds with some, but not all, of the acute ischemic changes as described. Additional punctate acute infarcts within the left parietal and occipital lobes. intubated 2/10-2/11. COVID +.      SLP Plan  Continue with current plan of care       Recommendations  Diet recommendations: Dysphagia 1 (puree);Nectar-thick liquid Liquids provided via: Teaspoon;Cup Medication Administration: Crushed with puree Supervision: Full  supervision/cueing for compensatory strategies Compensations: Slow rate;Small sips/bites;Lingual sweep for clearance of pocketing Postural Changes and/or Swallow Maneuvers: Seated upright 90 degrees                Oral Care Recommendations: Oral care BID Follow up Recommendations: Inpatient Rehab SLP Visit Diagnosis: Dysphagia, oropharyngeal phase (R13.12) Plan: Continue with current plan of care       GO                Henry [name] 04/22/2019, 12:56 PM  Kaushik Maul L. Tivis Ringer, [place] Office number [phone] Pager [phone]

## 2019-04-22 NOTE — Progress Notes (Addendum)
STROKE TEAM PROGRESS NOTE   INTERVAL HISTORY PT/Ot at bedside.  Patient daughter on the phone.  Patient sitting in chair, awake alert, orientated, no aphasia.  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  CT repeat today showed evolving large right MCA infarct, however no midline shift. TCD bubble study negative for PFO though.   Vitals:   04/21/19 1600 04/21/19 2137 04/22/19 0522 04/22/19 0800  BP: 134/63 128/67  139/73  Pulse: 60   (!) 43  Resp: (!) 24   (!) 27  Temp:  98.4 F (36.9 C) 98.4 F (36.9 C)   TempSrc:  Oral Oral   SpO2: 94%   94%  Weight:       CBC:  Recent Labs  Lab 04/17/19 1002 04/17/19 1012 04/18/19 0822 04/19/19 0215 04/20/19 0826 04/21/19 0345  WBC 5.1   < > 6.4   < > 10.2 7.9  NEUTROABS 3.5  --  4.7  --   --   --   HGB 12.7*   < > 11.5*   < > 11.5* 11.4*  HCT 37.8*   < > 33.9*   < > 32.9* 34.0*  MCV 109.6*   < > 108.0*   < > 107.5* 110.7*  PLT 130*   < > 134*   < > 139* 158   < > = values in this interval not displayed.   Basic Metabolic Panel:  Recent Labs  Lab 04/20/19 0826 04/21/19 0345  NA 137 137  K 4.0 4.2  CL 106 109  CO2 24 24  GLUCOSE 116* 135*  BUN 18 22  CREATININE 0.72 0.67  CALCIUM 8.3* 8.4*  MG 1.9 2.0  PHOS 3.3 2.9   Lipid Panel:     Component Value Date/Time   CHOL 93 04/18/2019 0822   TRIG 84 04/21/2019 0345   HDL 19 (L) 04/18/2019 0822   CHOLHDL 4.9 04/18/2019 0822   VLDL 21 04/18/2019 0822   LDLCALC 53 04/18/2019 0822   HgbA1c:  Lab Results  Component Value Date   HGBA1C 6.8 (H) 04/17/2019    IMAGING past 48 hours CT HEAD WO CONTRAST  Result Date: 04/22/2019 CLINICAL DATA:  Stroke, follow-up. EXAM: CT HEAD WITHOUT CONTRAST TECHNIQUE: Contiguous axial images were obtained from the base of the skull through the vertex without intravenous contrast. COMPARISON:  MRI/MRA head 04/19/2019, head CT 04/17/2019 FINDINGS: Brain: A large, now early subacute, infarct involving much of the right  MCA vascular territory has not significantly changed in extent as compared to prior MRI 04/19/2019. There is involvement of the cortical and subcortical brain, right basal ganglia, thalamus as well as internal and external capsules. Redemonstrated petechial hemorrhage within portions of the infarct territory. There is regional mass effect with partial effacement of the right lateral ventricle. No midline shift. No extra-axial fluid collection. No evidence of intracranial mass. Small infarcts within the left hemispheric white matter better appreciated on prior MRI. Vascular: No hyperdense vessel.  Atherosclerotic calcifications. Skull: Normal. Negative for fracture or focal lesion. Sinuses/Orbits: Visualized orbits demonstrate no acute abnormality. Mild bilateral maxillary sinus mucosal thickening. No significant mastoid effusion. IMPRESSION: A large, now early subacute, infarct involving much of the right MCA vascular territory has not significantly changed in extent. Redemonstrated associated petechial hemorrhage. Regional mass effect with partial effacement of the right lateral ventricle. No midline shift. Small infarcts within the left hemispheric white matter were better appreciated on prior MRI 04/19/2019. Electronically Signed   By: Marylyn Ishihara  Golden DO   On: 04/22/2019 07:36   DG Chest Port 1 View  Result Date: 04/21/2019 CLINICAL DATA:  Shortness of breath. EXAM: PORTABLE CHEST 1 VIEW COMPARISON:  04/18/2019 FINDINGS: The endotracheal tube has been removed. The patient is rotated to the right with grossly unchanged cardiomediastinal silhouette. Lung volumes are lower than on the prior study with increased elevation of the right hemidiaphragm. Mid and lower lung airspace opacities bilaterally have slightly increased in the right base and slightly improved in the left base. No sizable pleural effusion or pneumothorax is identified. IMPRESSION: Interval extubation. Lower lung volumes with mildly worsened  aeration in the right lung base and mildly improved aeration in the left lung base. Electronically Signed   By: Logan Bores M.D.   On: 04/21/2019 08:54   Cerebral Angio 04/17/2019 S/P RT common carotid arteriogram  Followed by near complete revascularization of occcluded RT MCA M1 seg with x 1 pass with 49mmx 40 mm solitaire X  retriever and x 1 pass with 60mm x 32 mm Trevoprovue retriever and penumbra aspiration achieving a TICI 2C revascularization.[name]  PHYSICAL EXAM   Temp:  [98.4 F (36.9 C)-98.8 F (37.1 C)] 98.4 F (36.9 C) (02/15 0522) Pulse Rate:  [43-63] 43 (02/15 0800) Resp:  [21-27] 27 (02/15 0800) BP: (124-139)/(63-74) 139/73 (02/15 0800) SpO2:  [91 %-94 %] 94 % (02/15 0800)  General - Well nourished, well developed, in no apparent distress.  Ophthalmologic - fundi not visualized due to noncooperation.  Cardiovascular - Regular rhythm and rate.  Mental Status -  Level of arousal and orientation to time, place, and person were intact. Language including expression, naming, repetition, comprehension was assessed and found intact. Mild dysarthria  Cranial Nerves II - XII - II - left hemianopia. III, IV, VI - right gaze preference but able to cross midline with incomplete left gaze. V - Facial sensation reported symmetrical. VII - left facial droop. VIII - Hearing & vestibular intact bilaterally. X - Palate elevates symmetrically. XI - Shoulder shrug decreased on the left. XII - Tongue protrusion intact.  Motor Strength - The patient's strength was good (5/5) RUE and RLE - follows commands well, however, left UE 1/5 proximal and 0/5 distal, left LE 4/5 proximal and distal.  Bulk was normal and fasciculations were absent.   Motor Tone - Muscle tone was assessed at the neck and appendages and was normal.  Reflexes - The patient's reflexes were symmetrical in all extremities and he had no pathological reflexes.  Sensory - Light touch, temperature/pinprick were assessed and  reported symmetrical by pt    Coordination - The patient had normal movements in the right hand with no ataxia or dysmetria.  Tremor was absent.  Gait and Station - deferred.   ASSESSMENT/PLAN Mr. JOSHAWA LAPIETRA is a 79 y.o. male with history of mild hypertension, low back pain, polycythemia, flu sx x 1 week presenting to ED earlier in the day with  generalized weakness, cough and fever, dx with COVID and left AMA. Later developed left-sided weakness, neglect and R gaze preference. EMS returned him to the ED.   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 Hypercoagulable from COVID-19 infection vs. Occult afib  CT head R MCA infarction w/ subtle petechial hemorrhage R frontal lobe. hyperdense R M1. ASPECTS 4    CTA head R M1 occlusion   CTA neck R ICA origin  70% stenosis. Proximal L ICA 30-40% stenosis. R VA w/ moderate narrowing at origin. L V1 moderate origin stenosis. Lung apices suspicious for interstitial lung dz, nodular opacity R lung apex.  CT perfusion R MCA 94mL core infarct. 120mL penumbra w/ 159mL mismatch.   Limited MRI  Extensive R MCA infarct. Punctate L parietal and occipital lobe infarcts.  Cerebral angio R M1 occlusion w/ near complete revascularization TICI2c  Repeat CT head 2/10 2035 R MCA cortical, subcortical and BG infarcts with likely contrast staining instead of hemorrhage. No mass effect or shift.   Repeat MRI 2/12 unchanged large R MCA infarct w/ petechial hemorrhage. Progressive edema w/ 60mm midline shift. Small L brain infarct.   Repeat MRA 2/12 R MCA remains patent.   Repeat CT head 2/15 unchanged large R MCA infarct w/ petechial hemorrhage, no midline shift   LE venous doppler DVT L gastrocnemius vein  2D Echo EF 55-60%. No source of embolus   Transcranial doppler w/ bubble neg for PFO  Please order 30 day cardiac event monitoring as outpt to rule  out afib   LDL 53  HgbA1c 6.8  Lovenox 40 mg sq daily for VTE prophylaxis  No antithrombotic prior to admission, now on IV heparin given acute DVT in the setting of high D-dimer and COVID infection -> will transition to DOAC  Therapy recommendations:  CIR-> SNF (needs to be > 20 days since positive COVID for xfer to CIR)  Disposition:  pending   Cerebral edema  MRI 2/12 showed right to left MLS 67mm, resolved on CT 2/15  Repeat CT 2/15 evolving large right MCA infarct but no more midline shift  Continue neuro monitoring  Acute Respiratory Failure COVID-19 PNA  intubated for IR, left intubated after IR d/t COVID  Remdesivir D4/5  Decadron D4/10  Extubate 2/10  D-Dimer 13.53-> >20 ->18.73->5.47->3.45  CRP 13.5->10->9.7->18.9->16.4  DVT, distal  LE venous doppler DVT L gastrocnemius vein  On IV heparin given high D-Dimer and COVID infection -> transition to DOAC  TCD bubble study no PFO  Time course per primary team - DVT in COVID  Hypertension  Home meds:  None listed . Long-term BP goal normotensive  Dysphagia . Secondary to stroke . Cleared for Dys 1 nectar thick liquids  . Speech on board    Other Stroke Risk Factors  Advanced age  Former Cigarette smoker, quit 42 yrs ago  Other Active Problems  Acute blood loss anemia Hgb 12.6->10.9->11.5->11.3->11.5->11.4  Chronic thrombocytopenia PLT 130->134->124->139->158  Mild Bradycardia - 42s  Hospital day # 5  Neurology will sign off. Please call with questions. Pt will follow up with stroke clinic Dr. Leonie Man [facility] in about 4 weeks. Thanks for the consult.   Rosalin Hawking, MD PhD Stroke Neurology 04/22/2019 12:19 PM   To contact Stroke Continuity provider, please refer to [email or web address] After hours, contact General Neurology

## 2019-04-22 NOTE — TOC Progression Note (Signed)
Transition of Care [facility]) - Progression Note    Patient Details  Name: Henry [name] MRN: [id number] Date of Birth: 04/18/1940  Transition of Care [facility]) CM/SW [place], LCSW Phone Number: 04/22/2019, 2:09 PM  Clinical Narrative:    Patient's daughter has selected [facility] pending insurance approval. Daughter is requesting pt to have a sem-private room; will follow up.    Expected Discharge Plan: [place] Barriers to Discharge: Continued Medical Work up  Expected Discharge Plan and Services Expected Discharge Plan: [place] In-house Referral: Clinical Social Work Discharge Planning Services: CM Consult Post Acute Care Choice: [place] arrangements for the past 2 months: Single Family Home                 DME Arranged: N/A DME Agency: NA                   Social Determinants of Health (SDOH) Interventions    Readmission Risk Interventions No flowsheet data found.

## 2019-04-22 NOTE — Progress Notes (Signed)
Patient off floor to CT.

## 2019-04-22 NOTE — Progress Notes (Addendum)
Occupational Therapy Treatment Patient Details Name: Henry [name] MRN: [id number] DOB: Apr 21, 1940 Today's Date: 04/22/2019    History of present illness 79 year old male patient admitted to the intensive care status post acute proximal right MCA stroke on 2/10.  Was outside of window for TPA.  Went to interventional radiology for clot extraction, returned to ICU on ventilator critical care asked to assist with postop care.   OT comments  PTA, pt was living with wife and was Independent/Modified Independent with ADLs and mobility. Pt progressing towards OT goals with improved acknowledgement of L side with and without cues, as well as improved light touch sensation of L UE. Pt remains with hemiplegia of L UE with PROM WFL at all joints, some edema noted. Continued R-sided gaze noted, but with cues, pt able to look to left and scan environment for therapist. Pt Setup for washing face and combing hair with pt able to demonstrate thoroughness with R and L sides without cues needed. Pt Mod A for bed mobility to sit EOB, grossly Min A needed to maintain sitting balance safely. Pt Max A +2 for sit to stand at bedside without AD, difficulty in weight shifting noted. Guided pt in pivot to drop arm recliner chair at Max A x 2 with pt assisting by reaching R UE to chair armrest. OT assisted in propping L UE on pillows to improve joint alignment and decrease swelling. Vitals remained stable throughout session with pt on RA. Assisted pt in [place] daughter and education provided on progress/interventions. Will continue to follow acutely.    Follow Up Recommendations  CIR    Equipment Recommendations  Other (comment)(Defer to next venue)    Recommendations for Other Services PT consult    Precautions / Restrictions Precautions Precautions: Fall;Other (comment)(L UE hemiplegia, L neglect) Restrictions Weight Bearing Restrictions: No       Mobility Bed Mobility Overal bed mobility: Needs  Assistance Bed Mobility: Supine to Sit     Supine to sit: Mod assist;HOB elevated     General bed mobility comments: Pt with improved ability to move B LE to EOB, follow commands to use R hand for bed rail  Transfers Overall transfer level: Needs assistance Equipment used: None Transfers: Sit to/from [employer] Transfers Sit to Stand: Max assist Stand pivot transfers: Max assist;+2 physical assistance       General transfer comment: Once in standing, pt required Max A to maintain standing balance with difficulty weight shifitng. Guided pt in pivot to recliner chair with 2 person assist. Pt assisting by reaching R hand to recliner armrest.     Balance Overall balance assessment: Needs assistance Sitting-balance support: Single extremity supported;Feet supported Sitting balance-Leahy Scale: Poor                                     ADL either performed or assessed with clinical judgement   ADL Overall ADL's : Needs assistance/impaired     Grooming: Set up;Wash/dry face;Brushing hair Grooming Details (indicate cue type and reason): Pt able to demonstrate brush L side of hair and washing L face without cues             Lower Body Dressing: Maximal assistance;Bed level Lower Body Dressing Details (indicate cue type and reason): Max A to don socks in bed             Functional mobility during ADLs: +2 for physical assistance General  ADL Comments: Pt with improved ability to acknowledge L side during BADLs. Pt noted with R gaze, but able to follow cues to look to L and scan room     Vision       Perception     Praxis      Cognition Arousal/Alertness: Awake/alert Behavior During Therapy: Flat affect Overall Cognitive Status: Impaired/Different from baseline Area of Impairment: Attention;Safety/judgement;Awareness;Problem solving                 Orientation Level: Time;Situation       Safety/Judgement: Decreased awareness of  safety;Decreased awareness of deficits Awareness: Intellectual Problem Solving: Slow processing;Decreased initiation;Difficulty sequencing;Requires verbal cues;Requires tactile cues General Comments: poor attention to left         Exercises Exercises: General Upper Extremity General Exercises - Upper Extremity Shoulder Flexion: PROM Elbow Flexion: PROM Elbow Extension: PROM Digit Composite Flexion: PROM Composite Extension: PROM   Shoulder Instructions       General Comments Some edema in L hand, attempted to prop up L UE on pillow in chair. Vitals WFL on RA during session. Assisted pt in Facetime with family - left talking to daughter    Pertinent Vitals/ Pain       Pain Assessment: No/denies pain  Home Living                                          Prior Functioning/Environment              Frequency  Min 3X/week        Progress Toward Goals  OT Goals(current goals can now be found in the care plan section)  Progress towards OT goals: Progressing toward goals  Acute Rehab OT Goals Patient Stated Goal: see his wife OT Goal Formulation: With patient Time For Goal Achievement: 05/02/19 Potential to Achieve Goals: Good ADL Goals Pt Will Perform Grooming: with min guard assist;sitting Pt Will Transfer to Toilet: with min assist;stand pivot transfer;bedside commode Additional ADL Goal #1: Pt will maintain sitting balance for ~10 minutes with Min Guard A in preparation for ADLs Additional ADL Goal #2: Pt will sustain attention to simple ADLs with 2-3 cues Additional ADL Goal #3: Pt will locate 3/5 ADL items in left visual field with Mod cues  Plan Discharge plan remains appropriate    Co-evaluation    PT/OT/SLP Co-Evaluation/Treatment: Yes Reason for Co-Treatment: For patient/therapist safety;Complexity of the patient's impairments (multi-system involvement)   OT goals addressed during session: ADL's and self-care;Other (comment)(Safety  with transfers)      AM-PAC OT "6 Clicks" Daily Activity     Outcome Measure     Help from another person taking care of personal grooming?: A Little Help from another person toileting, which includes using toliet, bedpan, or urinal?: Total Help from another person bathing (including washing, rinsing, drying)?: A Lot Help from another person to put on and taking off regular upper body clothing?: A Lot Help from another person to put on and taking off regular lower body clothing?: A Lot 6 Click Score: 10    End of Session Equipment Utilized During Treatment: Gait belt  OT Visit Diagnosis: Unsteadiness on feet (R26.81);Other abnormalities of gait and mobility (R26.89);Muscle weakness (generalized) (M62.81);Pain   Activity Tolerance Patient tolerated treatment well   Patient Left in chair;with call bell/phone within reach;with chair alarm set;Other (comment)(On Facetime with family)   Nurse Communication  Time: [id number] OT Time Calculation (min): 45 min  Charges: OT General Charges $OT Visit: 1 Visit OT Treatments $Self Care/Home Management : 8-22 mins $Therapeutic Activity: 8-22 mins  Layla Maw, OTR/L   Layla Maw 04/22/2019, 1:38 PM

## 2019-04-22 NOTE — Progress Notes (Signed)
TCD bubble study completed with Dr. Erlinda Hong.  04/22/2019 3:51 PM Maudry Mayhew, MHA, RVT, RDCS, RDMS

## 2019-04-23 LAB — COMPREHENSIVE METABOLIC PANEL
ALT: 64 U/L — ABNORMAL HIGH (ref 0–44)
AST: 70 U/L — ABNORMAL HIGH (ref 15–41)
Albumin: 2.1 g/dL — ABNORMAL LOW (ref 3.5–5.0)
Alkaline Phosphatase: 97 U/L (ref 38–126)
Anion gap: 10 (ref 5–15)
BUN: 19 mg/dL (ref 8–23)
CO2: 25 mmol/L (ref 22–32)
Calcium: 8.5 mg/dL — ABNORMAL LOW (ref 8.9–10.3)
Chloride: 104 mmol/L (ref 98–111)
Creatinine, Ser: 0.73 mg/dL (ref 0.61–1.24)
GFR calc Af Amer: >60 mL/min (ref >60)
GFR calc non Af Amer: >60 mL/min (ref >60)
Glucose, Bld: 102 mg/dL — ABNORMAL HIGH (ref 70–99)
Potassium: 4.4 mmol/L (ref 3.5–5.1)
Sodium: 139 mmol/L (ref 135–145)
Total Bilirubin: 0.8 mg/dL (ref 0.3–1.2)
Total Protein: 5.6 g/dL — ABNORMAL LOW (ref 6.5–8.1)

## 2019-04-23 LAB — TRIGLYCERIDES: Triglycerides: 147 mg/dL (ref <150)

## 2019-04-23 LAB — GLUCOSE, CAPILLARY
Glucose-Capillary: 85 mg/dL (ref 70–99)
Glucose-Capillary: 68 mg/dL — ABNORMAL LOW (ref 70–99)
Glucose-Capillary: 99 mg/dL (ref 70–99)
Glucose-Capillary: 117 mg/dL — ABNORMAL HIGH (ref 70–99)
Glucose-Capillary: 164 mg/dL — ABNORMAL HIGH (ref 70–99)

## 2019-04-23 LAB — CBC
HCT: 36.8 % — ABNORMAL LOW (ref 39.0–52.0)
Hemoglobin: 12.4 g/dL — ABNORMAL LOW (ref 13.0–17.0)
MCH: 36.6 pg — ABNORMAL HIGH (ref 26.0–34.0)
MCHC: 33.7 g/dL (ref 30.0–36.0)
MCV: 108.6 fL — ABNORMAL HIGH (ref 80.0–100.0)
Platelets: 175 10*3/uL (ref 150–400)
RBC: 3.39 MIL/uL — ABNORMAL LOW (ref 4.22–5.81)
RDW: 13.8 % (ref 11.5–15.5)
WBC: 6.5 10*3/uL (ref 4.0–10.5)
nRBC: 0 % (ref 0.0–0.2)

## 2019-04-23 LAB — PROCALCITONIN: Procalcitonin: 0.12 ng/mL

## 2019-04-23 LAB — D-DIMER, QUANTITATIVE: D-Dimer, Quant: 3.26 ug/mL-FEU — ABNORMAL HIGH (ref 0.00–0.50)

## 2019-04-23 LAB — MAGNESIUM: Magnesium: 2 mg/dL (ref 1.7–2.4)

## 2019-04-23 LAB — C-REACTIVE PROTEIN: CRP: 4.5 mg/dL — ABNORMAL HIGH (ref <1.0)

## 2019-04-23 MED ORDER — LACTATED RINGERS IV SOLN: INTRAVENOUS | Status: AC

## 2019-04-23 NOTE — Progress Notes (Signed)
   04/23/19 1000  Family/Significant Other Communication  Family/Significant Other Update Called;Updated   updated daughter wendy

## 2019-04-23 NOTE — Progress Notes (Signed)
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  Was outside of window for TPA.  Went to interventional radiology for clot extraction, returned to ICU on ventilator critical care asked to assist with postop care.    PT Comments    Pt sitting up in recliner, more lethargic than yesterday, but continues to be agreeable to working with therapy. Pt continues to be limited in safe mobility by L sided weakness UE>LE, L neglect and associated decreased coordination and balance as well as oxygen desaturation (see General Comments). Pt rolled to sink to participate in self care tasks in standing. Pt requires maxAx2 for power up and maxA on L side for maintaining balance while washing face with R hand. D/c plans remain appropriate at this time. PT will continue to follow acutely.    Follow Up Recommendations  Supervision/Assistance - 24 hour;SNF     Equipment Recommendations  Other (comment)(TBA)       Precautions / Restrictions Precautions Precautions: Fall;Other (comment)(Airborne) Restrictions Weight Bearing Restrictions: No    Mobility  Bed Mobility Overal bed mobility: Needs Assistance             General bed mobility comments: Pt received in recliner  Transfers Overall transfer level: Needs assistance Equipment used: None Transfers: Sit to/from Stand Sit to Stand: Max assist;+2 physical assistance;+2 safety/equipment         General transfer comment: Pt Max A x 2 to maintain balance in standing and facilitate activity participation        Balance Overall balance assessment: Needs assistance Sitting-balance support: Feet supported Sitting balance-Leahy Scale: Poor     Standing balance support: Bilateral upper extremity supported Standing balance-Leahy  Scale: Zero                              Cognition Arousal/Alertness: Lethargic Behavior During Therapy: Flat affect Overall Cognitive Status: Impaired/Different from baseline Area of Impairment: Attention;Safety/judgement;Awareness;Problem solving                 Orientation Level: Disoriented to;Time;Situation Current Attention Level: Focused;Sustained Memory: Decreased short-term memory Following Commands: Follows one step commands inconsistently;Follows one step commands with increased time Safety/Judgement: Decreased awareness of safety;Decreased awareness of deficits Awareness: Intellectual Problem Solving: Slow processing;Decreased initiation;Difficulty sequencing;Requires verbal cues;Requires tactile cues General Comments: poor attention to left       Exercises Other Exercises Other Exercises: Pursed lip breathing    General Comments General comments (skin integrity, edema, etc.): Pt received on 2 L O2. Removed for grooming at sink, which dropped to 84% in standing. Instructed in pursed lip breathing improving to 88%. Increased to 3 L O2 with O2 quickly recovering to 96%. After resting, returned to 2 L o2 and 94%      Pertinent Vitals/Pain Pain Assessment: No/denies pain           PT Goals (current goals can now be found in the care plan section) Acute Rehab PT Goals Patient Stated Goal: see his wife PT Goal Formulation: With patient Time For Goal Achievement: 05/02/19 Potential to Achieve Goals: Good Progress towards PT goals: Progressing toward goals    Frequency    Min 4X/week      PT Plan Current plan remains appropriate    Co-evaluation PT/OT/SLP Co-Evaluation/Treatment: Yes  Reason for Co-Treatment: Complexity of the patient's impairments (multi-system involvement) PT goals addressed during session: Mobility/safety with mobility        AM-PAC PT "6 Clicks" Mobility   Outcome Measure  Help needed turning from your back to  your side while in a flat bed without using bedrails?: A Lot Help needed moving from lying on your back to sitting on the side of a flat bed without using bedrails?: Total Help needed moving to and from a bed to a chair (including a wheelchair)?: Total Help needed standing up from a chair using your arms (e.g., wheelchair or bedside chair)?: Total Help needed to walk in hospital room?: Total Help needed climbing 3-5 steps with a railing? : Total 6 Click Score: 7    End of Session Equipment Utilized During Treatment: Gait belt Activity Tolerance: Patient tolerated treatment well Patient left: in chair;with call bell/phone within reach;with chair alarm set Nurse Communication: Mobility status PT Visit Diagnosis: Unsteadiness on feet (R26.81);Other abnormalities of gait and mobility (R26.89);Difficulty in walking, not elsewhere classified (R26.2)     Time: [id number] PT Time Calculation (min) (ACUTE ONLY): 37 min  Charges:  $Therapeutic Activity: 8-22 mins                     Shree Espey B. Migdalia Dk PT, DPT Acute Rehabilitation Services Pager [phone] Office [phone]    Henry [name] 04/23/2019, 3:52 PM

## 2019-04-23 NOTE — Progress Notes (Signed)
Hypoglycemic Event  CBG: 68  Treatment: 8 oz juice/soda  Symptoms: None  Follow-up CBG: Time:0903 CBG Result:99  Possible Reasons for Event: Inadequate meal intake  Comments/MD notified:pt eating breakfast tray CBG up to [place]

## 2019-04-23 NOTE — Progress Notes (Signed)
OT Treatment  Clinical Impression: PTA, pt lived with wife and was Independent with ADLs/mobility. Pt progressing towards goals, but noted to be more lethargic during today's session. Pt received in recliner chair and agreeable to participate. Pt noted on 2 L O2 upon entry. Attempted to guide pt in grooming tasks while standing at sink. Pt Max A + 2 for sit to stand and to maintain standing balance at sink. Pt with difficulty attending to washing face while standing. Pt stood for up to 3 minutes with tactile and verbal cues for sequencing/initiation. Pt able to finish washing face seated with Supervision/Setup. Removed O2 for standing activity with O2 dropping to 84%. With instruction for pursed lip breathing and brief increase to 3 L O2, pt O2 stats returned to [facility]. Pt Mod A to brush teeth while seated at sink. Pt required assistance for the bimanual, fine motor coordination aspects of task with cues for safe rinsing and spitting. Pt required Min A periodically to correct sitting balance during this task. Pt noted with improved ability to maintain head in midline position for grooming tasks with minor cues. 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 Will continue to follow acutely.     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  Was outside of window for TPA.  Went to interventional radiology for clot extraction, returned to ICU on ventilator critical care asked to assist with postop care.  Precautions  Precautions Fall;Other (comment) (Airborne)  Pain Assessment  Pain Assessment  No/denies pain  Cognition  Arousal/Alertness Lethargic  Behavior During Therapy Flat affect  Overall Cognitive Status Impaired/Different from baseline  Area of Impairment Attention;Safety/judgement;Awareness;Problem solving  Orientation Level Disoriented to;Time;Situation  Current Attention Level Focused;Sustained  Memory Decreased short-term memory  Following Commands Follows one step commands inconsistently;Follows one step commands with increased time  Safety/Judgement Decreased awareness of safety;Decreased awareness of deficits  Awareness Intellectual  Problem Solving Slow processing;Decreased initiation;Difficulty sequencing;Requires verbal cues;Requires tactile cues  General Comments poor attention to left   Upper Extremity Assessment  Upper Extremity Assessment Generalized weakness;LUE deficits/detail  LUE Deficits / Details No active movement. Edema. neglect of left side  LUE Sensation decreased proprioception  LUE Coordination decreased fine motor;decreased gross motor  ADL  Overall ADL's  Needs assistance/impaired  Grooming Supervision/safety;Wash/dry face;Moderate assistance;Oral care;Sitting;Standing  Grooming Details (indicate cue type and reason) Attempted to guide pt in washing face while standing at sink with 2 person assist. Pt with difficulty completing and attending to this task in standing. Supervision/setup to finish washing face seated. Pt Mod A for oral care to brush teeth with assistance to place toothpaste on toothbrush, cues for sequencing and safe rinsing/spitting  Functional mobility during ADLs +2 for physical assistance  General ADL Comments Pt more lethargic today. Pt Max A + 2 to attempt grooming standing sink  Bed Mobility  Overal bed mobility Needs Assistance  General bed mobility comments Pt received in recliner  Balance  Overall balance assessment Needs assistance  Sitting-balance support Feet supported  Sitting balance-Leahy Scale Poor  Standing  balance support Bilateral upper extremity supported  Standing balance-Leahy Scale Zero  Restrictions  Weight Bearing  Restrictions No  Transfers  Overall transfer level Needs assistance  Equipment used None  Transfers Sit to/from Stand  Sit to Stand Max assist;+2 physical assistance;+2 safety/equipment  General transfer comment Pt Max A x 2 to maintain balance in standing and facilitate activity participation  General Comments  General comments (skin integrity, edema, etc.) Pt received on 2 L O2. Removed for grooming at sink, which dropped to 84% in standing. Instructed in pursed lip breathing improving to 88%. Increased to 3 L O2 with O2 quickly recovering to 96%. After resting, returned to 2 L o2 and 94%  Exercises  Exercises Other exercises  Other Exercises  Other Exercises Pursed lip breathing  OT - End of Session  Equipment Utilized During Treatment Gait belt;Oxygen  Activity Tolerance Patient limited by fatigue  Patient left in chair;with call bell/phone within reach;with chair alarm set  Nurse Communication Mobility status  OT Assessment/Plan  OT Plan Discharge plan remains appropriate  OT Visit Diagnosis Unsteadiness on feet (R26.81);Other abnormalities of gait and mobility (R26.89);Muscle weakness (generalized) (M62.81);Pain  OT Frequency (ACUTE ONLY) Min 3X/week  Recommendations for Other Services PT consult  Follow Up Recommendations CIR;SNF;Supervision/Assistance - 24 hour  OT Equipment Other (comment) (Defer to next venue)  AM-PAC OT "6 Clicks" Daily Activity Outcome Measure (Version 2)  Help from another person eating meals? 2  Help from another person taking care of personal grooming? 2  Help from another person toileting, which includes using toliet, bedpan, or urinal? 1  Help from another person bathing (including washing, rinsing, drying)? 2  Help from another person to put on and taking off regular upper body clothing? 2  Help from another person to put on and  taking off regular lower body clothing? 1  6 Click Score 10  OT Goal Progression  Progress towards OT goals Progressing toward goals  Acute Rehab OT Goals  Patient Stated Goal see his wife  OT Goal Formulation With patient  Time For Goal Achievement 05/02/19  Potential to Achieve Goals Good  ADL Goals  Pt Will Perform Grooming with min guard assist;sitting  Pt Will Transfer to Toilet with min assist;stand pivot transfer;bedside commode  Additional ADL Goal #1 Pt will maintain sitting balance for ~10 minutes with Min Guard A in preparation for ADLs  Additional ADL Goal #2 Pt will sustain attention to simple ADLs with 2-3 cues  Additional ADL Goal #3 Pt will locate 3/5 ADL items in left visual field with Mod cues  OT Time Calculation  OT Start Time (ACUTE ONLY) 1031  OT Stop Time (ACUTE ONLY) 1108  OT Time Calculation (min) 37 min  OT General Charges  $OT Visit 1 Visit  OT Treatments  $Self Care/Home Management  8-22 mins

## 2019-04-23 NOTE — Progress Notes (Signed)
Inpatient Rehabilitation Admissions Coordinator  Noted patient has been approved for [facility]. Patient Covid + 2/9 therefore does not meet criteria for post Covid admit to CIR. We will sign off at this time.  Danne Baxter, RN, MSN Rehab Admissions Coordinator [phone] 04/23/2019 5:10 PM

## 2019-04-23 NOTE — Progress Notes (Addendum)
PROGRESS NOTE                                                                                                                                                                                                             Patient Demographics:    Henry [name], is a 79 y.o. male, DOB - 11-22-1940, YKD:[id number]  Outpatient Primary MD for the patient is [name], [name], MD   Admit date - 04/17/2019   LOS - 6  CC - L arm weakness     Brief Narrative: Patient is a 79 y.o. male with PMHx of HTN, polycythemia, chronic GERD, chronic low back pain who was admitted on 2/10 following a right MCA stroke-patient was taken by IR for revascularization procedure as patient was outside the TPA window-following which he was admitted to the ICU and required ventilator support.  Patient was also found to have COVID-19.  Upon clinical stability-and extubation-patient was transferred to the [facility] on 2/12.  Significant events:  2/10>> admitted with acute right MCA stroke-to IR for revascularization of Right MCA-return to ICU on a ventilator 2/11>> extubated 2/12>> transfer to [facility]   Subjective:   Patient in bed, appears comfortable, denies any headache, no fever, no chest pain or pressure, no shortness of breath , no abdominal pain.  Continues to have left arm weakness, says today he would not like to have breakfast and would not like to sit up in the chair says that ' I feel I am going in the wrong direction slowly", encouraged to do so, also informed the staff right away to put the patient in chair and encourage him to have breakfast.   Assessment  & Plan :   Acute Hypoxic Resp Failure due to Covid 19 Viral pneumonia: Remains stable-on just 1 L of oxygen-he was extubated on 2/12-CRP has significantly jumped up today-he had a fever yesterday afternoon-he is at high risk for aspiration pneumonia.  Sputum cultures positive on  2/10 for MSSA.  He is also status post extubation on 2/12.  Suspect that he may have superimposed aspiration pneumonia - on Ancef.  Finished remdesivir continue steroids.  Trend procalcitonin clinically improving.  O2 requirements:  SpO2: 95 % O2 Flow Rate (L/min): 2 L/min FiO2 (%): 28 %   COVID-19 Labs: Recent Labs    04/21/19 0345 04/22/19 1205 04/23/19  0416  DDIMER 3.45* 3.30* 3.26*  FERRITIN 716*  --   --   CRP 16.4* 6.9* 4.5*    No results found for: BNP  Recent Labs  Lab 04/17/19 1630 04/21/19 0345  PROCALCITON 0.10 0.17    Lab Results  Component Value Date   SARSCOV2NAA POSITIVE (A) 04/16/2019     COVID-19 Medications: Steroids: 2/10>> Remdesivir: 2/10>>  Antibiotics: Ancef: 2/13>> stop on 04/23/2019 and monitor    Clinical dehydration.  Gentle IV fluids on 04/23/2019, appears slightly withdrawn, encourage staff to put him in the chair and offer him more food.  Continue PT OT.    Left lower extremity DVT: Likely provoked by COVID-19-on IV heparin >> Eliquis.  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  Patient is s/p revascularization of right M1 occlusion by interventional radiology.  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  Echo with preserved EF and without any embolic foci.  LDL 53, A1c 6.8.  Has gross dysarthria, left facial droop, and significant weakness in the left upper extremity.  Stroke MD now switched to Eliquis, continue PT OT, still has dense left upper extremity hemiparesis, also ordered 30-day event monitor post discharge per neuro recommendation.  LDL and A1c at goal for his age.  Lab Results  Component Value Date   CHOL 93 04/18/2019   HDL 19 (L) 04/18/2019   LDLCALC 53 04/18/2019   LDLDIRECT 135.0 02/24/2017   TRIG 147 04/23/2019   CHOLHDL 4.9 04/18/2019    Lab Results  Component Value Date   HGBA1C 6.8 (H) 04/17/2019    Dysphagia: Secondary to CVA-followed  by SLP-on dysphagia 1 diet.  Maintain aspiration precautions, stop antibiotics on 04/23/2019 and monitor closely.  Will trend procalcitonin.  HTN: BP controlled-not on any antihypertensives.  Allow permissive hypertension.    Consults  :  Neuro/PCCM, IR  Procedures  :    ETT:2/10>>2/11 Revascularization of right proximal MCA 2/10   TTE -  1. Left ventricular ejection fraction, by estimation, is 55 to 60%. The left ventricle has normal function. The left ventrical has no regional wall motion abnormalities. Left ventricular diastolic parameters are  indeterminate.  2. Right ventricular systolic function is normal. The right ventricular size is normal. There is normal pulmonary artery systolic pressure.  3. The mitral valve is normal in structure and function. trivial mitral valve regurgitation. No evidence of mitral stenosis.  4. The aortic valve is normal in structure and function. Aortic valve regurgitation is not visualized. No aortic stenosis is present.    Condition -   Guarded  Family Communication  :    Daughter updated over the phone 04/21/2019.  Called the daughter again on 04/23/2019, informed her that her dad today is slightly withdrawn and appears to be slightly more negative in terms of saying that his body is giving up, daughter abruptly changed her demeanor and raised her voice and told me that you always give me bad news, I reminded her that I have only talked to her once before and we have not had multiple conversations in the conversation on the 14th was actually positive as he was doing better.  However she says that henceforth she would not like to talk to [place] as we give her bad news.  I again asked her how many times she has talk to me and she says about 5-6 times, I then informed her that we only had 2 conversations, I started taking  care of her dad on the 14th and there is no way I have talked to her 5-6 times on 5-6 different days, I think she is sadly mistaken and  has talked to different physicians, she then says that she does not like to talk to physicians anyways because all the physicians have been negative.   Code Status :  Full Code  Diet :  Diet Order            DIET - DYS 1 Room service appropriate? No; Fluid consistency: Nectar Thick  Diet effective now               Disposition Plan  :  Remain hospitalized-CIR versus SNF on discharge.  Barriers to discharge: SNF once has stabilized from pneumonia standpoint, still has dysphagia and being closely monitored.  Antimicorbials  :    Anti-infectives (From admission, onward)   Start     Dose/Rate Route Frequency Ordered Stop   04/20/19 1615  ceFAZolin (ANCEF) IVPB 1 g/50 mL premix  Status:  Discontinued     1 g 100 mL/hr over 30 Minutes Intravenous Every 8 hours 04/20/19 1602 04/23/19 0926   04/18/19 1000  remdesivir 100 mg in sodium chloride 0.9 % 100 mL IVPB     100 mg 200 mL/hr over 30 Minutes Intravenous Daily 04/17/19 1457 04/21/19 1024   04/17/19 1630  remdesivir 200 mg in sodium chloride 0.9% 250 mL IVPB     200 mg 580 mL/hr over 30 Minutes Intravenous Once 04/17/19 1457 04/17/19 1908   04/17/19 1128  ceFAZolin (ANCEF) 2-4 GM/100ML-% IVPB    Note to Pharmacy: Fredric Dine   : cabinet override      04/17/19 1128 04/17/19 1543     DVT Prophylaxis  : IV heparin  Inpatient Medications  Scheduled Meds: .  stroke: mapping our early stages of recovery book   Does not apply Once  . apixaban  5 mg Oral BID  . vitamin C  500 mg Per Tube Daily  . chlorhexidine gluconate (MEDLINE KIT)  15 mL Mouth Rinse BID  . dexamethasone (DECADRON) injection  4 mg Intravenous Daily  . docusate sodium  200 mg Oral BID  . folic acid  1 mg Intravenous Daily  . insulin aspart  0-9 Units Subcutaneous TID WC  . polyethylene glycol  17 g Oral BID  . thiamine injection  100 mg Intravenous Daily  . zinc sulfate  220 mg Per Tube Daily   Continuous Infusions: . sodium chloride 50 mL/hr at  04/21/19 0205  . lactated ringers     PRN Meds:.[DISCONTINUED] acetaminophen **OR** acetaminophen (TYLENOL) oral liquid 160 mg/5 mL **OR** [DISCONTINUED] acetaminophen, [DISCONTINUED] acetaminophen **OR** acetaminophen (TYLENOL) oral liquid 160 mg/5 mL **OR** [DISCONTINUED] acetaminophen, hydrALAZINE, iohexol, Resource ThickenUp Clear, senna-docusate   Time Spent in minutes  25  See all Orders from today for further details   Lala Lund M.D on 04/23/2019 at 9:28 AM  To page go to [email or web address] - use universal password  [facility] -  Office  [phone]    Objective:   Vitals:   04/22/19 1953 04/23/19 0400 04/23/19 0420 04/23/19 0736  BP: 117/61 140/68  131/67  Pulse: (!) 55     Resp:  '13 14 17  ' Temp: 97.7 F (36.5 C) 99.1 F (37.3 C)    TempSrc: Oral Oral    SpO2: 94% 95%    Weight:        Wt Readings from Last 3 Encounters:  04/18/19 80.1  kg  04/16/19 81.6 kg  08/24/18 81.6 kg     Intake/Output Summary (Last 24 hours) at 04/23/2019 1275 Last data filed at 04/23/2019 0900 Gross per 24 hour  Intake 1540 ml  Output 1300 ml  Net 240 ml     Physical Exam  Awake Alert, left-sided facial droop and arm weakness, arm strength 1/5, leg strength 3/5, facial droop seems to have slightly improved on 04/22/2019 [place].AT,PERRAL Supple Neck,No JVD, No cervical lymphadenopathy appriciated.  Symmetrical Chest wall movement, Good air movement bilaterally, CTAB RRR,No Gallops, Rubs or new Murmurs, No Parasternal Heave +ve B.Sounds, Abd Soft, No tenderness, No organomegaly appriciated, No rebound - guarding or rigidity. No Cyanosis, Clubbing or edema, No new Rash or bruise     Data Review:    CBC Recent Labs  Lab 04/16/19 1358 04/16/19 1358 04/17/19 1002 04/17/19 1012 04/18/19 0822 04/18/19 0822 04/19/19 0215 04/20/19 0826 04/21/19 0345 04/22/19 1205 04/23/19 0416  WBC 5.3   < > 5.1   < > 6.4   < > 7.8 10.2 7.9 6.7 6.5  HGB 13.8   < > 12.7*   < > 11.5*    < > 11.3* 11.5* 11.4* 12.1* 12.4*  HCT 40.5   < > 37.8*   < > 33.9*   < > 33.2* 32.9* 34.0* 36.5* 36.8*  PLT 145*   < > 130*   < > 134*   < > 124* 139* 158 162 175  MCV 108.6*   < > 109.6*   < > 108.0*   < > 109.9* 107.5* 110.7* 110.3* 108.6*  MCH 37.0*   < > 36.8*   < > 36.6*   < > 37.4* 37.6* 37.1* 36.6* 36.6*  MCHC 34.1   < > 33.6   < > 33.9   < > 34.0 35.0 33.5 33.2 33.7  RDW 13.7   < > 13.7   < > 14.0   < > 13.9 13.6 14.0 14.1 13.8  LYMPHSABS 1.0  --  0.9  --  1.0  --   --   --   --   --   --   MONOABS 0.5  --  0.5  --  0.7  --   --   --   --   --   --   EOSABS 0.1  --  0.1  --  0.0  --   --   --   --   --   --   BASOSABS 0.0  --  0.0  --  0.0  --   --   --   --   --   --    < > = values in this interval not displayed.    Chemistries  Recent Labs  Lab 04/19/19 0215 04/20/19 0826 04/21/19 0345 04/22/19 1205 04/23/19 0416  NA 139 137 137 140 139  K 4.1 4.0 4.2 3.6 4.4  CL 108 106 109 105 104  CO2 '24 24 24 24 25  ' GLUCOSE 125* 116* 135* 104* 102*  BUN '20 18 22 22 19  ' CREATININE 0.87 0.72 0.67 0.67 0.73  CALCIUM 8.4* 8.3* 8.4* 8.4* 8.5*  MG 2.0 1.9 2.0 1.9 2.0  AST 32 42* 30 63* 70*  ALT 26 41 36 49* 64*  ALKPHOS 70 80 86 78 97  BILITOT 0.9 1.3* 0.7 0.6 0.8   ------------------------------------------------------------------------------------------------------------------ Recent Labs    04/22/19 1205 04/23/19 0416  TRIG 104 147    Lab Results  Component Value Date  HGBA1C 6.8 (H) 04/17/2019   ------------------------------------------------------------------------------------------------------------------ No results for input(s): TSH, T4TOTAL, T3FREE, THYROIDAB in the last 72 hours.  Invalid input(s): FREET3 ------------------------------------------------------------------------------------------------------------------ Recent Labs    04/21/19 0345  FERRITIN 716*    Coagulation profile Recent Labs  Lab 04/17/19 1630  INR 1.2    Recent Labs     04/22/19 1205 04/23/19 0416  DDIMER 3.30* 3.26*    Cardiac Enzymes No results for input(s): CKMB, TROPONINI, MYOGLOBIN in the last 168 hours.  Invalid input(s): CK ------------------------------------------------------------------------------------------------------------------ No results found for: BNP  Micro Results Recent Results (from the past 240 hour(s))  Culture, blood (routine x 2)     Status: None   Collection Time: 04/16/19  1:45 PM   Specimen: BLOOD  Result Value Ref Range Status   Specimen Description BLOOD LEFT ANTECUBITAL  Final   Special Requests   Final    BOTTLES DRAWN AEROBIC AND ANAEROBIC Blood Culture results may not be optimal due to an inadequate volume of blood received in culture bottles   Culture   Final    NO GROWTH 5 DAYS Performed at [facility], [place] [address], [place], [place] [address]    Report Status 04/21/2019 FINAL  Final  Culture, blood (routine x 2)     Status: None   Collection Time: 04/16/19  2:49 PM   Specimen: BLOOD RIGHT HAND  Result Value Ref Range Status   Specimen Description BLOOD RIGHT HAND  Final   Special Requests   Final    BOTTLES DRAWN AEROBIC AND ANAEROBIC Blood Culture results may not be optimal due to an inadequate volume of blood received in culture bottles   Culture   Final    NO GROWTH 5 DAYS Performed at [facility], [place] [address], [place], [place] [address]    Report Status 04/21/2019 FINAL  Final  Urine culture     Status: Abnormal   Collection Time: 04/16/19  4:45 PM   Specimen: Urine, Random  Result Value Ref Range Status   Specimen Description URINE, RANDOM  Final   Special Requests NONE  Final   Culture (A)  Final    <10,000 COLONIES/mL INSIGNIFICANT GROWTH Performed at [facility], [place] [address] [place], [place] [address]    Report Status 04/17/2019 FINAL  Final  Respiratory Panel by RT PCR (Flu A&B, Covid) -     Status: Abnormal   Collection Time: 04/16/19  5:30 PM  Result  Value Ref Range Status   SARS Coronavirus 2 by RT PCR POSITIVE (A) NEGATIVE Final    Comment: RESULT CALLED TO, READ BACK BY AND VERIFIED WITH: Dois Davenport RN [id number] 04/16/19 A BROWNING (NOTE) SARS-CoV-2 target nucleic acids are DETECTED. SARS-CoV-2 RNA is generally detectable in upper respiratory specimens  during the acute phase of infection. Positive results are indicative of the presence of the identified virus, but do not rule out bacterial infection or co-infection with other pathogens not detected by the test. Clinical correlation with patient history and other diagnostic information is necessary to determine patient infection status. The expected result is Negative. Fact Sheet for Patients:  PinkCheek.be Fact Sheet for Healthcare Providers: GravelBags.it This test is not yet approved or cleared by the [place] FDA and  has been authorized for detection and/or diagnosis of SARS-CoV-2 by FDA under an Emergency Use Authorization (EUA).  This EUA will remain in effect (meaning this test can be used) fo r the duration of  the COVID-19 declaration under Section  564(b)(1) of the Act, 21 U.S.C. section 360bbb-3(b)(1), unless the authorization is terminated or revoked sooner.    Influenza A by PCR NEGATIVE NEGATIVE Final   Influenza B by PCR NEGATIVE NEGATIVE Final    Comment: (NOTE) The Xpert Xpress SARS-CoV-2/FLU/RSV assay is intended as an aid in  the diagnosis of influenza from Nasopharyngeal swab specimens and  should not be used as a sole basis for treatment. Nasal washings and  aspirates are unacceptable for Xpert Xpress SARS-CoV-2/FLU/RSV  testing. Fact Sheet for Patients: PinkCheek.be Fact Sheet for Healthcare Providers: GravelBags.it This test is not yet approved or cleared by the [place] FDA and  has been authorized for detection and/or diagnosis of  SARS-CoV-2 by  FDA under an Emergency Use Authorization (EUA). This EUA will remain  in effect (meaning this test can be used) for the duration of the  Covid-19 declaration under Section 564(b)(1) of the Act, 21  U.S.C. section 360bbb-3(b)(1), unless the authorization is  terminated or revoked. Performed at [facility], [place] [address] [place], [place] [address]   Culture, respiratory (non-expectorated)     Status: None   Collection Time: 04/17/19  5:05 PM   Specimen: Tracheal Aspirate; Respiratory  Result Value Ref Range Status   Specimen Description TRACHEAL ASPIRATE  Final   Special Requests   Final    NONE Performed at [facility], [place] [address], [place], [place] [address]    Gram Stain NO WBC SEEN NO ORGANISMS SEEN   Final   Culture RARE STAPHYLOCOCCUS AUREUS  Final   Report Status 04/20/2019 FINAL  Final   Organism ID, Bacteria STAPHYLOCOCCUS AUREUS  Final      Susceptibility   Staphylococcus aureus - MIC*    CIPROFLOXACIN <=0.5 SENSITIVE Sensitive     ERYTHROMYCIN <=0.25 SENSITIVE Sensitive     GENTAMICIN <=0.5 SENSITIVE Sensitive     OXACILLIN 0.5 SENSITIVE Sensitive     TETRACYCLINE <=1 SENSITIVE Sensitive     VANCOMYCIN <=0.5 SENSITIVE Sensitive     TRIMETH/SULFA <=10 SENSITIVE Sensitive     CLINDAMYCIN <=0.25 SENSITIVE Sensitive     RIFAMPIN <=0.5 SENSITIVE Sensitive     Inducible Clindamycin NEGATIVE Sensitive     * RARE STAPHYLOCOCCUS AUREUS  MRSA PCR Screening     Status: None   Collection Time: 04/20/19  9:24 AM   Specimen: Nasal Mucosa; Nasopharyngeal  Result Value Ref Range Status   MRSA by PCR NEGATIVE NEGATIVE Final    Comment:        The GeneXpert MRSA Assay (FDA approved for NASAL specimens only), is one component of a comprehensive MRSA colonization surveillance program. It is not intended to diagnose MRSA infection nor to guide or monitor treatment for MRSA infections. Performed at [facility], [place] [address],  [place], [place] [address]     Radiology Reports CT ANGIO HEAD W OR WO CONTRAST  Result Date: 04/17/2019 CLINICAL DATA:  Stroke, follow-up. EXAM: CT ANGIOGRAPHY HEAD AND NECK CT PERFUSION BRAIN TECHNIQUE: Multidetector CT imaging of the head and neck was performed using the standard protocol during bolus administration of intravenous contrast. Multiplanar CT image reconstructions and MIPs were obtained to evaluate the vascular anatomy. Carotid stenosis measurements (when applicable) are obtained utilizing NASCET criteria, using the distal internal carotid diameter as the denominator. Multiphase CT imaging of the brain was performed following IV bolus contrast injection. Subsequent parametric perfusion maps were calculated using RAPID software. CONTRAST:  132m OMNIPAQUE IOHEXOL 350 MG/ML SOLN COMPARISON:  Noncontrast head CT performed earlier the same day. FINDINGS: CTA NECK FINDINGS Aortic arch: The origins of the innominate and left common carotid arteries are incompletely imaged. The visualized aortic arch is unremarkable. No significant innominate or proximal subclavian stenosis visualized. Right carotid system: CCA patent to the bifurcation without stenosis. Prominent predominantly calcified plaque within the distal common carotid artery and proximal ICA. There is likely greater than 70% stenosis at the origin of the ICA, although exact quantification of stenosis is difficult due to irregularity of calcified plaque. Distal to this the ICA is patent within the neck without stenosis. Left carotid system: CCA patent to the bifurcation without stenosis. Calcified plaque within the distal common carotid artery, carotid bifurcation and proximal ICA. Resultant 30-40% narrowing of the proximal ICA as compared to the more distal vessel. Distal to this the ICA is patent within the neck without stenosis. Vertebral arteries: Right vertebral artery dominant. 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 Calcified plaque at  the origin of the medial V1 left vertebral artery branch with at least moderate ostial stenosis. More distally the bilateral vertebral arteries are patent within the neck without stenosis. Skeleton: No acute bony abnormality. Cervical spondylosis with C5-C6 and C6-C7 posterior disc osteophytes. Other neck: No neck mass or cervical adenopathy. Upper chest: Paraseptal emphysema within the imaged lung apices. Also within the imaged lung apices but there is interstitial prominence suspicious for interstitial lung disease. There are more nodular opacities within the right upper lobe measuring up to 1.4 cm (series 7, image 56) (series 7, image 99). Review of the MIP images confirms the above findings CTA HEAD FINDINGS Anterior circulation: The intracranial internal carotid arteries are patent with scattered calcified plaque. No more than mild stenosis within these vessels. 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 There is minimal reconstitution of flow seen within M2 and more distal right MCA branch vessels. The left middle cerebral artery is patent without high-grade proximal stenosis. The bilateral anterior cerebral arteries are patent without significant proximal stenosis. Posterior circulation: The intracranial vertebral arteries are patent without significant stenosis, as is the basilar artery. The bilateral posterior cerebral arteries are patent without significant proximal stenosis. A sizable right posterior communicating artery is present. The a left posterior communicating artery is poorly delineated and may be hypoplastic or absent. Venous sinuses: Within limitations of contrast timing, no convincing thrombus. Anatomic variants: As described Review of the MIP images confirms the above findings CT Brain Perfusion Findings: ASPECTS: 4 CBF (<30%) Volume: 51m (in the right MCA vascular territory) Perfusion (Tmax>6.0s) volume: 147 mL (in the right MCA vascular territory) Mismatch Volume: 1365m Infarction Location:Right MCA vascular territory Findings of a proximal M1 right MCA occlusion and the perfusion results were discussed by telephone at the time of interpretation on 04/17/2019 at 10:38 am to provider Dr. ArLorraine Laxwho verbally acknowledged these results. IMPRESSION: CTA neck: 1. The common and internal carotid arteries are patent within the neck bilaterally. 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 There is likely greater than 70% stenosis at the origin of the right ICA. 30-40% stenosis of the proximal left ICA. 2. The vertebral arteries are patent within the neck bilaterally. The right vertebral artery is dominant with moderate atherosclerotic narrowing at its origin. Duplicated V1 left vertebral artery. There is at least moderate ostial stenosis at the origin of the medial left V1 vertebral artery branch. 3. The partially imaged lung apices demonstrate findings suspicious for interstitial lung disease. Additionally, there are nodular  opacities within the right lung apex measuring up to 1.4 cm. Dedicated chest CT is recommended for further evaluation when clinically feasible. CTA head: 1. 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 There is minimal reconstitution of flow within M2 and more distal right MCA branch vessels. 2. No other intracranial large vessel occlusion or proximal high-grade arterial stenosis identified. CT perfusion head: The perfusion software identifies a 10 mL core infarct within the right MCA vascular territory. However, please note more extensive infarction changes were appreciated on concurrently performed noncontrast head CT. The perfusion software detects a 147 mL region of hypoperfused parenchyma within the right MCA vascular territory. Reported mismatch 137 mL. Electronically Signed   By: Kellie Simmering DO   On: 04/17/2019 11:04   CT HEAD WO CONTRAST  Result Date: 04/22/2019 CLINICAL DATA:  Stroke, follow-up. EXAM: CT HEAD WITHOUT  CONTRAST TECHNIQUE: Contiguous axial images were obtained from the base of the skull through the vertex without intravenous contrast. COMPARISON:  MRI/MRA head 04/19/2019, head CT 04/17/2019 FINDINGS: Brain: A large, now early subacute, infarct involving much of the right MCA vascular territory has not significantly changed in extent as compared to prior MRI 04/19/2019. There is involvement of the cortical and subcortical brain, right basal ganglia, thalamus as well as internal and external capsules. Redemonstrated petechial hemorrhage within portions of the infarct territory. There is regional mass effect with partial effacement of the right lateral ventricle. No midline shift. No extra-axial fluid collection. No evidence of intracranial mass. Small infarcts within the left hemispheric white matter better appreciated on prior MRI. Vascular: No hyperdense vessel.  Atherosclerotic calcifications. Skull: Normal. Negative for fracture or focal lesion. Sinuses/Orbits: Visualized orbits demonstrate no acute abnormality. Mild bilateral maxillary sinus mucosal thickening. No significant mastoid effusion. IMPRESSION: A large, now early subacute, infarct involving much of the right MCA vascular territory has not significantly changed in extent. Redemonstrated associated petechial hemorrhage. Regional mass effect with partial effacement of the right lateral ventricle. No midline shift. Small infarcts within the left hemispheric white matter were better appreciated on prior MRI 04/19/2019. Electronically Signed   By: Kellie Simmering DO   On: 04/22/2019 07:36   CT HEAD WO CONTRAST  Result Date: 04/17/2019 CLINICAL DATA:  Follow-up intervention for right MCA occlusion. EXAM: CT HEAD WITHOUT CONTRAST TECHNIQUE: Contiguous axial images were obtained from the base of the skull through the vertex without intravenous contrast. COMPARISON:  Interventional images same day.  Head CT same day. FINDINGS: Brain: In the region the small  core infarction in the lateral temporal lobe and the penumbra of the majority of the right middle cerebral artery cortical territory, there is brain swelling with effacement of the sulci and areas of patchy low-density as well as some areas of increased attenuation. Low-density present within the right basal ganglia. The appearance of the increased attenuation is more consistent with post procedural contrast staining than intraparenchymal hemorrhage. Hemorrhage is not excluded but not favored. Certainly, there is no frank hematoma. No evidence of midline shift. No hydrocephalus. No extra-axial collection. Vascular: No acute vascular finding primarily. Skull: Negative Sinuses/Orbits: Clear except for mild mucosal thickening. Other: None IMPRESSION: Ischemic changes in the right MCA territory affecting the cortical brain, subcortical brain and right basal ganglia. Areas of low-density as well as areas of indistinct higher density favored to represent contrast staining rather than frank hemorrhage. Low level petechial bleeding is not excluded. There is no discrete hematoma. No mass effect or shift, despite mild-to-moderate swelling in  the region of affected brain. Electronically Signed   By: Nelson Chimes M.D.   On: 04/17/2019 20:35   CT ANGIO NECK W OR WO CONTRAST  Result Date: 04/17/2019 CLINICAL DATA:  Stroke, follow-up. EXAM: CT ANGIOGRAPHY HEAD AND NECK CT PERFUSION BRAIN TECHNIQUE: Multidetector CT imaging of the head and neck was performed using the standard protocol during bolus administration of intravenous contrast. Multiplanar CT image reconstructions and MIPs were obtained to evaluate the vascular anatomy. Carotid stenosis measurements (when applicable) are obtained utilizing NASCET criteria, using the distal internal carotid diameter as the denominator. Multiphase CT imaging of the brain was performed following IV bolus contrast injection. Subsequent parametric perfusion maps were calculated using RAPID  software. CONTRAST:  166m OMNIPAQUE IOHEXOL 350 MG/ML SOLN COMPARISON:  Noncontrast head CT performed earlier the same day. FINDINGS: CTA NECK FINDINGS Aortic arch: The origins of the innominate and left common carotid arteries are incompletely imaged. The visualized aortic arch is unremarkable. No significant innominate or proximal subclavian stenosis visualized. Right carotid system: CCA patent to the bifurcation without stenosis. Prominent predominantly calcified plaque within the distal common carotid artery and proximal ICA. There is likely greater than 70% stenosis at the origin of the ICA, although exact quantification of stenosis is difficult due to irregularity of calcified plaque. Distal to this the ICA is patent within the neck without stenosis. Left carotid system: CCA patent to the bifurcation without stenosis. Calcified plaque within the distal common carotid artery, carotid bifurcation and proximal ICA. Resultant 30-40% narrowing of the proximal ICA as compared to the more distal vessel. Distal to this the ICA is patent within the neck without stenosis. Vertebral arteries: Right vertebral artery dominant. 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 Calcified plaque at the origin of the medial V1 left vertebral artery branch with at least moderate ostial stenosis. More distally the bilateral vertebral arteries are patent within the neck without stenosis. Skeleton: No acute bony abnormality. Cervical spondylosis with C5-C6 and C6-C7 posterior disc osteophytes. Other neck: No neck mass or cervical adenopathy. Upper chest: Paraseptal emphysema within the imaged lung apices. Also within the imaged lung apices but there is interstitial prominence suspicious for interstitial lung disease. There are more nodular opacities within the right upper lobe measuring up to 1.4 cm (series 7, image 56) (series 7, image 99). Review of the MIP images confirms the above findings CTA HEAD FINDINGS Anterior circulation:  The intracranial internal carotid arteries are patent with scattered calcified plaque. No more than mild stenosis within these vessels. 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 There is minimal reconstitution of flow seen within M2 and more distal right MCA branch vessels. The left middle cerebral artery is patent without high-grade proximal stenosis. The bilateral anterior cerebral arteries are patent without significant proximal stenosis. Posterior circulation: The intracranial vertebral arteries are patent without significant stenosis, as is the basilar artery. The bilateral posterior cerebral arteries are patent without significant proximal stenosis. A sizable right posterior communicating artery is present. The a left posterior communicating artery is poorly delineated and may be hypoplastic or absent. Venous sinuses: Within limitations of contrast timing, no convincing thrombus. Anatomic variants: As described Review of the MIP images confirms the above findings CT Brain Perfusion Findings: ASPECTS: 4 CBF (<30%) Volume: 170m(in the right MCA vascular territory) Perfusion (Tmax>6.0s) volume: 147 mL (in the right MCA vascular territory) Mismatch Volume: [id number] Location:Right MCA vascular territory Findings of a proximal M1 right MCA occlusion and the perfusion results  were discussed by telephone at the time of interpretation on 04/17/2019 at 10:38 am to provider Dr. Lorraine Lax, who verbally acknowledged these results. IMPRESSION: CTA neck: 1. The common and internal carotid arteries are patent within the neck bilaterally. 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 There is likely greater than 70% stenosis at the origin of the right ICA. 30-40% stenosis of the proximal left ICA. 2. The vertebral arteries are patent within the neck bilaterally. The right vertebral artery is dominant with moderate atherosclerotic narrowing at its origin. Duplicated V1 left vertebral  artery. There is at least moderate ostial stenosis at the origin of the medial left V1 vertebral artery branch. 3. The partially imaged lung apices demonstrate findings suspicious for interstitial lung disease. Additionally, there are nodular opacities within the right lung apex measuring up to 1.4 cm. Dedicated chest CT is recommended for further evaluation when clinically feasible. CTA head: 1. 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 There is minimal reconstitution of flow within M2 and more distal right MCA branch vessels. 2. No other intracranial large vessel occlusion or proximal high-grade arterial stenosis identified. CT perfusion head: The perfusion software identifies a 10 mL core infarct within the right MCA vascular territory. However, please note more extensive infarction changes were appreciated on concurrently performed noncontrast head CT. The perfusion software detects a 147 mL region of hypoperfused parenchyma within the right MCA vascular territory. Reported mismatch 137 mL. Electronically Signed   By: Kellie Simmering DO   On: 04/17/2019 11:04   MR ANGIO HEAD WO CONTRAST  Result Date: 04/19/2019 CLINICAL DATA:  Follow-up right MCA occlusion with intervention. EXAM: MRI HEAD WITHOUT CONTRAST MRA HEAD WITHOUT CONTRAST TECHNIQUE: Multiplanar, multiecho pulse sequences of the brain and surrounding structures were obtained without intravenous contrast. Angiographic images of the head were obtained using MRA technique without contrast. 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 Differences in diffusion appearance is attributed to artifact from petechial hemorrhage. There is progressive swelling with partial effacement of the right lateral ventricle. Midline shift only measures 2 mm. No entrapment. Confluent petechial hemorrhage  is seen in the infarcted area. There are a few punctate acute infarcts in the left occipital parietal region, also seen prior. No extra-axial collection. Vascular: Arterial findings below. Skull and upper cervical spine: Negative Sinuses/Orbits: Negative MRA HEAD FINDINGS Motion degraded. Symmetric carotid and vertebral arteries. Dominant left PICA and right AICA. The vertebral, carotid, and basilar arteries are smooth and widely patent. No branch occlusion or aneurysm. Accounting for motion there is symmetric flow in the right MCA. IMPRESSION: 1. 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 There is progressive swelling with up to 2 mm of midline shift. 2. Redemonstrated minimal acute infarct in left hemispheric white matter. 3. Negative motion degraded MRA.  The right MCA is patent. Electronically Signed   By: Monte Fantasia M.D.   On: 04/19/2019 11:50   MR BRAIN WO CONTRAST  Result Date: 04/19/2019 CLINICAL DATA:  Follow-up right MCA occlusion with intervention. EXAM: MRI HEAD WITHOUT CONTRAST MRA HEAD WITHOUT CONTRAST TECHNIQUE: Multiplanar, multiecho pulse sequences of the brain and surrounding structures were obtained without intravenous contrast. Angiographic images of the head were obtained using MRA technique without contrast. COMPARISON:  Brain MRI from 2 days ago FINDINGS: MRI HEAD  FINDINGS Brain: Large acute infarct involving the majority of the right MCA territory, most confluent in the inferior division and at the striatum. Differences in diffusion appearance is attributed to artifact from petechial hemorrhage. There is progressive swelling with partial effacement of the right lateral ventricle. Midline shift only measures 2 mm. No entrapment. Confluent petechial hemorrhage is seen in the infarcted area. There are a few punctate acute infarcts in the left occipital parietal region, also seen prior. No extra-axial collection. Vascular:  Arterial findings below. Skull and upper cervical spine: Negative Sinuses/Orbits: Negative MRA HEAD FINDINGS Motion degraded. Symmetric carotid and vertebral arteries. Dominant left PICA and right AICA. The vertebral, carotid, and basilar arteries are smooth and widely patent. No branch occlusion or aneurysm. Accounting for motion there is symmetric flow in the right MCA. IMPRESSION: 1. 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 There is progressive swelling with up to 2 mm of midline shift. 2. Redemonstrated minimal acute infarct in left hemispheric white matter. 3. Negative motion degraded MRA.  The right MCA is patent. Electronically Signed   By: Monte Fantasia M.D.   On: 04/19/2019 11:50   MR BRAIN WO CONTRAST  Result Date: 04/17/2019 CLINICAL DATA:  Focal neuro deficit, greater than 6 hours, stroke suspected. EXAM: MRI HEAD WITHOUT CONTRAST TECHNIQUE: Multiplanar, multiecho pulse sequences of the brain and surrounding structures were obtained without intravenous contrast. COMPARISON:  Noncontrast CT head, CT angiogram head/neck and CT perfusion performed earlier the same day 04/17/2019 FINDINGS: Brain: A limited protocol brain MRI was performed at the ordering provider's request consisting of only axial diffusion-weighted imaging and axial T2 FLAIR imaging. There is extensive multifocal restricted diffusion within the right MCA vascular territory, consistent with acute ischemic changes and involving portions of the right frontal, parietal, temporal and occipital lobes as well as right insular cortex, posterior limb of right internal capsule, right lentiform nucleus, right caudate body and right corona radiata. Of note, this includes acute ischemic change within the right motor strip. The largest region of diffusion weighted signal abnormality within the right temporal lobe measures 7.8 x 3.0 cm in transaxial dimensions (series 4, image 23). There  is T2/FLAIR hyperintensity which corresponds with some, but not all, of the acute ischemic change. For instance, there is little to no T2/FLAIR hyperintensity corresponding with the ischemic changes within the right corona radiata, basal ganglia and right insula at this time. Punctate acute cortical infarct within the left parietooccipital lobe (series 4, image 33). Additional probable punctate acute infarcts more superiorly within the left parietal lobe (series 4, image 35) and within the lateral left occipital lobe (series 4, image 24). Vascular: Poorly assessed on the acquired sequences. Skull and upper cervical spine: No evidence of focal marrow lesion on the acquired sequences. Sinuses/Orbits: Visualized orbits demonstrate no acute abnormality. Minimal ethmoid sinus mucosal thickening. No significant mastoid effusion. IMPRESSION: Extensive restricted diffusion consistent with acute ischemia involving the right MCA vascular territory. There is T2/FLAIR hyperintensity which corresponds with some, but not all, of the acute ischemic changes as described. Additional punctate acute infarcts within the left parietal and occipital lobes. Electronically Signed   By: Kellie Simmering DO   On: 04/17/2019 12:50   IR CT Head Ltd  Result Date: 04/17/2019 INDICATION: New onset of left-sided weakness with right gaze deviation and dysarthria.  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  EXAM: 1. EMERGENT LARGE VESSEL OCCLUSION THROMBOLYSIS (anterior CIRCULATION)  COMPARISON:  CT angiogram of the head and neck of April 17, 2019.  MEDICATIONS: Ancef 2 g IV antibiotic was administered within 1 hour of the procedure.  ANESTHESIA/SEDATION: General anesthesia.  CONTRAST:  Isovue 300 approximately 65 mL.  FLUOROSCOPY TIME:  Fluoroscopy Time: 41 minutes 18 seconds (1531 mGy).  COMPLICATIONS: None immediate.  TECHNIQUE: Following a full explanation of the procedure along with the  potential associated complications, an informed witnessed consent was obtained from the patient's wife and daughter. The risks of intracranial hemorrhage of 10%, worsening neurological deficit, ventilator dependency, death and inability to revascularize were all reviewed in detail with the patient's wife and daughter.  The patient was then put under general anesthesia by the Department of Anesthesiology at [facility].  The right groin was prepped and draped in the usual sterile fashion. Thereafter using modified Seldinger technique, transfemoral access into the right common femoral artery was obtained without difficulty. Over a 0.035 inch guidewire an 8 [place] Pinnacle sheath was inserted. Through this, and also over a 0.035 inch guidewire a 5 [place] JB 1 catheter was advanced to the aortic arch region and selectively positioned in the innominate artery and the right common carotid artery.  FINDINGS: The innominate artery arteriogram demonstrates the origin of the right subclavian artery and the right common carotid artery to be widely patent.  The right common carotid arteriogram demonstrates the right external carotid artery and its major branches to be widely patent.  The right internal carotid artery at the bulb to the cranial skull base demonstrates wide patency with mild FMD-like changes involving the mid cervical right ICA.  The petrous, cavernous and the supraclinoid segments are widely patent.  A right posterior communicating artery is seen opacifying the right posterior cerebral distribution.  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 The right anterior cerebral artery opacifies into the capillary and venous phases.  PROCEDURE: The diagnostic JB 1 catheter in right common carotid artery was exchanged over an 8 [place] 300 cm Rosen exchange guidewire for an 087 balloon guide catheter which had been prepped with 50% contrast and 50% heparinized saline  infusion. This was advanced to the right common carotid artery just proximal to the origin of the right internal carotid artery. The guidewire was removed. Good aspiration was obtained from the hub of the balloon guide catheter.  Gentle contrast injection demonstrated no evidence of spasms or of intraluminal filling defects. Over a 0.014 inch standard Synchro micro guidewire with a J configuration, the combination of the 021 Trevo ProVue microcatheter inside of a 6 [place] Catalyst 132 cm guide catheter was advanced to the distal end of the balloon guide catheter.  With the micro guidewire leading with a J-tip configuration to avoid dissections or inducing spasm, the combination was navigated without difficulty to the supraclinoid right ICA.  The occluded right middle cerebral artery was entered with a micro guidewire which was advanced to the M3 regions of the inferior division of the right middle cerebral artery followed by the microcatheter. The guidewire was removed. Good aspiration was obtained from the hub of the microcatheter. Gentle contrast injection demonstrated safe position of tip of the microcatheter. This was then connected to continuous heparinized saline infusion.  A 4 mm x 40 mm Solitaire X retrieval device was then advanced to the distal end of the microcatheter. The O ring on the delivery microcatheter was then loosened. With slight forward gentle traction with the right hand on the delivery micro guidewire  with left hand the delivery microcatheter was retrieved unsheathing the retrieval device.  The Catalyst 6 [place] guide catheter was advanced to the occluded right middle cerebral artery. With proximal flow arrest obtained by inflating the balloon in the right internal carotid artery, and after constant aspiration using a Penumbra aspiration device at the hub of the 6 [place] Catalyst guide catheter, and with a 60 mL syringe at the hub of the balloon guide catheter, the combination of the  retrieval device, the microcatheter, and the 6 [place] Catalyst guide were retrieved and removed as constant aspiration was applied at the hub of the balloon guide catheter.  Proximal flow arrest was reversed. A control arteriogram performed through the balloon guide catheter which had been advanced into the mid right internal carotid artery demonstrated complete opacification of the right middle cerebral artery and the inferior division. However, the superior division remained occluded.  A second pass was then made again using the above combination. With the micro guidewire leading, access into the M3 region of the superior division was obtained with a microcatheter. The micro guidewire was removed. Good aspiration was established at the hub of the microcatheter. A gentle control arteriogram again demonstrates safe positioning of the tip of the microcatheter which was then connected to continuous heparinized saline infusion. Again with constant flow arrest in the right internal carotid artery, and aspiration at the hub of the Catalyst guide catheter using the Penumbra aspiration device at the origin of the superior division for 2 minutes, and with a 60 mL syringe at the hub of the balloon guide catheter, the combination of the retrieval device, the microcatheter and the 6 [place] Catalyst guide catheter were retrieved and removed. Following reversal of flow arrest, control arteriogram performed through the balloon guide catheter in the right internal carotid artery now demonstrated revascularization of the superior division and also further improved caliber of the inferior division.  There continued to be a very small branch extending into the anterior temporal region which was occluded in the M2 region. Also noted was a distal M3 M4 junction branch occlusion of a parietal branch of the inferior division.  A TICI 2C revascularization had been achieved.  The right anterior cerebral artery remained widely patent  with cross-filling via the anterior communicating artery of the left anterior cerebral A2 segment and distally.  The posterior communicating artery remain widely patent.  Moderate spasm in the distal cervical right ICA, and the superior division of the right middle cerebral artery responded to 2 aliquots of 25 mcg of nitroglycerin intra-arterially.  A final control arteriogram performed through the balloon guide catheter in the right common carotid artery demonstrated significantly improved caliber of the right internal carotid artery, with maintenance of a TICI 2c revascularization of the right middle cerebral artery distribution.  The balloon guide catheter was removed. The right groin 8 [place] Pinnacle sheath was then removed with manual compression in the right groin. Distal pulses remained Dopplerable in the dorsalis pedis, and the posterior tibial regions bilaterally unchanged.  A flat panel CT of the brain demonstrated heterogeneous hyper attenuation in the right temporal lobe region anteriorly probably representing contrast stain versus less likely hemorrhagic conversion of infarct tissue.  There was no mass-effect noted on the right lateral ventricle. Patient was then transferred to the ICU unit for post thrombectomy management. Patient was left intubated on account of his COVID status.  IMPRESSION: Endovascular near complete revascularization of the right middle cerebral artery territory with 2 passes with the 4  mm x 40 mm Solitaire X retrieval device and Penumbra aspiration achieving a TICI 2c revascularization.  PLAN: Follow-up as per referring MD.   Electronically Signed   By: Luanne Bras M.D.   On: 04/18/2019 10:42   CT CEREBRAL PERFUSION W CONTRAST  Result Date: 04/17/2019 CLINICAL DATA:  Stroke, follow-up. EXAM: CT ANGIOGRAPHY HEAD AND NECK CT PERFUSION BRAIN TECHNIQUE: Multidetector CT imaging of the head and neck was performed using the standard protocol during bolus  administration of intravenous contrast. Multiplanar CT image reconstructions and MIPs were obtained to evaluate the vascular anatomy. Carotid stenosis measurements (when applicable) are obtained utilizing NASCET criteria, using the distal internal carotid diameter as the denominator. Multiphase CT imaging of the brain was performed following IV bolus contrast injection. Subsequent parametric perfusion maps were calculated using RAPID software. CONTRAST:  115m OMNIPAQUE IOHEXOL 350 MG/ML SOLN COMPARISON:  Noncontrast head CT performed earlier the same day. FINDINGS: CTA NECK FINDINGS Aortic arch: The origins of the innominate and left common carotid arteries are incompletely imaged. The visualized aortic arch is unremarkable. No significant innominate or proximal subclavian stenosis visualized. Right carotid system: CCA patent to the bifurcation without stenosis. Prominent predominantly calcified plaque within the distal common carotid artery and proximal ICA. There is likely greater than 70% stenosis at the origin of the ICA, although exact quantification of stenosis is difficult due to irregularity of calcified plaque. Distal to this the ICA is patent within the neck without stenosis. Left carotid system: CCA patent to the bifurcation without stenosis. Calcified plaque within the distal common carotid artery, carotid bifurcation and proximal ICA. Resultant 30-40% narrowing of the proximal ICA as compared to the more distal vessel. Distal to this the ICA is patent within the neck without stenosis. Vertebral arteries: Right vertebral artery dominant. 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 Calcified plaque at the origin of the medial V1 left vertebral artery branch with at least moderate ostial stenosis. More distally the bilateral vertebral arteries are patent within the neck without stenosis. Skeleton: No acute bony abnormality. Cervical spondylosis with C5-C6 and C6-C7 posterior disc osteophytes. Other neck:  No neck mass or cervical adenopathy. Upper chest: Paraseptal emphysema within the imaged lung apices. Also within the imaged lung apices but there is interstitial prominence suspicious for interstitial lung disease. There are more nodular opacities within the right upper lobe measuring up to 1.4 cm (series 7, image 56) (series 7, image 99). Review of the MIP images confirms the above findings CTA HEAD FINDINGS Anterior circulation: The intracranial internal carotid arteries are patent with scattered calcified plaque. No more than mild stenosis within these vessels. 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 There is minimal reconstitution of flow seen within M2 and more distal right MCA branch vessels. The left middle cerebral artery is patent without high-grade proximal stenosis. The bilateral anterior cerebral arteries are patent without significant proximal stenosis. Posterior circulation: The intracranial vertebral arteries are patent without significant stenosis, as is the basilar artery. The bilateral posterior cerebral arteries are patent without significant proximal stenosis. A sizable right posterior communicating artery is present. The a left posterior communicating artery is poorly delineated and may be hypoplastic or absent. Venous sinuses: Within limitations of contrast timing, no convincing thrombus. Anatomic variants: As described Review of the MIP images confirms the above findings CT Brain Perfusion Findings: ASPECTS: 4 CBF (<30%) Volume: 145m(in the right MCA vascular territory) Perfusion (Tmax>6.0s) volume: 147 mL (in the right MCA vascular territory) Mismatch Volume:  149m Infarction Location:Right MCA vascular territory Findings of a proximal M1 right MCA occlusion and the perfusion results were discussed by telephone at the time of interpretation on 04/17/2019 at 10:38 am to provider Dr. ALorraine Lax who verbally acknowledged these results. IMPRESSION: CTA neck: 1. The common and  internal carotid arteries are patent within the neck bilaterally. 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 There is likely greater than 70% stenosis at the origin of the right ICA. 30-40% stenosis of the proximal left ICA. 2. The vertebral arteries are patent within the neck bilaterally. The right vertebral artery is dominant with moderate atherosclerotic narrowing at its origin. Duplicated V1 left vertebral artery. There is at least moderate ostial stenosis at the origin of the medial left V1 vertebral artery branch. 3. The partially imaged lung apices demonstrate findings suspicious for interstitial lung disease. Additionally, there are nodular opacities within the right lung apex measuring up to 1.4 cm. Dedicated chest CT is recommended for further evaluation when clinically feasible. CTA head: 1. 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 There is minimal reconstitution of flow within M2 and more distal right MCA branch vessels. 2. No other intracranial large vessel occlusion or proximal high-grade arterial stenosis identified. CT perfusion head: The perfusion software identifies a 10 mL core infarct within the right MCA vascular territory. However, please note more extensive infarction changes were appreciated on concurrently performed noncontrast head CT. The perfusion software detects a 147 mL region of hypoperfused parenchyma within the right MCA vascular territory. Reported mismatch 137 mL. Electronically Signed   By: KKellie SimmeringDO   On: 04/17/2019 11:04   DG Chest Port 1 View  Result Date: 04/21/2019 CLINICAL DATA:  Shortness of breath. EXAM: PORTABLE CHEST 1 VIEW COMPARISON:  04/18/2019 FINDINGS: The endotracheal tube has been removed. The patient is rotated to the right with grossly unchanged cardiomediastinal silhouette. Lung volumes are lower than on the prior study with increased elevation of the right hemidiaphragm. Mid and lower lung airspace opacities  bilaterally have slightly increased in the right base and slightly improved in the left base. No sizable pleural effusion or pneumothorax is identified. IMPRESSION: Interval extubation. Lower lung volumes with mildly worsened aeration in the right lung base and mildly improved aeration in the left lung base. Electronically Signed   By: ALogan BoresM.D.   On: 04/21/2019 08:54   DG Chest Port 1 View  Result Date: 04/18/2019 CLINICAL DATA:  Acute respiratory failure EXAM: PORTABLE CHEST 1 VIEW COMPARISON:  Radiograph 04/17/2019 FINDINGS: *Endotracheal tube in the mid trachea, 5.5 cm from the carina. *Telemetry leads overlie the chest. Slight improvement in the bilateral airspace disease most pronounced in the left base and periphery and right mid lung. No new areas of airspace disease. No pneumothorax or visible effusion the portion of the costophrenic sulci on the left is collimated. Cardiomediastinal contours are stable with a calcified aorta. No acute osseous or soft tissue abnormality. IMPRESSION: Stable satisfactory positioning of the endotracheal tube. Slight interval improvement of bilateral airspace disease. Electronically Signed   By: PLovena LeM.D.   On: 04/18/2019 06:15   DG Chest Port 1 View  Result Date: 04/17/2019 CLINICAL DATA:  COVID-19. Intubation. EXAM: PORTABLE CHEST 1 VIEW COMPARISON:  Radiograph yesterday. FINDINGS: Endotracheal tube tip 5.8 cm from the carina. Probable esophageal temperature probe with tip at the level of the clavicular heads. No definite enteric tube is visualized. Patchy bilateral airspace disease within both lungs, slight worsening in  the right midlung zone from prior. Unchanged heart size and mediastinal contours. No pneumothorax or large pleural effusion. No acute osseous abnormalities are seen. IMPRESSION: 1. Bilateral pneumonia, with slight worsening in the right mid lung since yesterday. 2. Endotracheal tube tip 5.8 cm from the carina. Probable esophageal  temperature probe with tip at the clavicular heads. Electronically Signed   By: Keith Rake M.D.   On: 04/17/2019 16:45   DG Chest Portable 1 View  Result Date: 04/16/2019 CLINICAL DATA:  Cough and fever. New weakness. EXAM: PORTABLE CHEST 1 VIEW COMPARISON:  08/11/2009 FINDINGS: Normal sized heart. Interval diffuse prominence of the interstitial markings and patchy opacities in the mid and lower lung zones bilaterally. No visible pleural fluid. Diffuse osteo IMPRESSION: Interval probable bilateral pneumonia and pneumonitis. Electronically Signed   By: Claudie Revering M.D.   On: 04/16/2019 14:28   VAS [place] TRANSCRANIAL DOPPLER W BUBBLES  Result Date: 04/22/2019  Transcranial Doppler with Bubble Indications: Stroke. Performing Technologist: Maudry Mayhew MHA, RDMS, RVT, RDCS  Examination Guidelines: A complete evaluation includes B-mode imaging, spectral Doppler, color Doppler, and power Doppler as needed of all accessible portions of each vessel. Bilateral testing is considered an integral part of a complete examination. Limited examinations for reoccurring indications may be performed as noted.  Summary:  A vascular evaluation was performed. The right middle cerebral artery was studied. An IV was inserted into the patient's right Forearm. Verbal informed consent was obtained.  There is no evidence of HITS (high intensity transient signals) at rest. Valsalva maneuver was suboptimal. Therefore, there is no obvious evidence of significant PFO (patent foramen ovale). *See table(s) above for TCD measurements and observations.    Preliminary    IR PERCUTANEOUS ART THROMBECTOMY/INFUSION INTRACRANIAL INC DIAG ANGIO  Result Date: 04/19/2019 INDICATION: New onset of left-sided weakness with right gaze deviation and dysarthria. 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 EXAM: 1. EMERGENT LARGE VESSEL OCCLUSION THROMBOLYSIS (anterior CIRCULATION) COMPARISON:  CT angiogram  of the head and neck of April 17, 2019. MEDICATIONS: Ancef 2 g IV antibiotic was administered within 1 hour of the procedure. ANESTHESIA/SEDATION: General anesthesia. CONTRAST:  Isovue 300 approximately 65 mL. FLUOROSCOPY TIME:  Fluoroscopy Time: 41 minutes 18 seconds (1531 mGy). COMPLICATIONS: None immediate. TECHNIQUE: Following a full explanation of the procedure along with the potential associated complications, an informed witnessed consent was obtained from the patient's wife and daughter. The risks of intracranial hemorrhage of 10%, worsening neurological deficit, ventilator dependency, death and inability to revascularize were all reviewed in detail with the patient's wife and daughter. The patient was then put under general anesthesia by the Department of Anesthesiology at [facility]. The right groin was prepped and draped in the usual sterile fashion. Thereafter using modified Seldinger technique, transfemoral access into the right common femoral artery was obtained without difficulty. Over a 0.035 inch guidewire an 8 [place] Pinnacle sheath was inserted. Through this, and also over a 0.035 inch guidewire a 5 [place] JB 1 catheter was advanced to the aortic arch region and selectively positioned in the innominate artery and the right common carotid artery. FINDINGS: The innominate artery arteriogram demonstrates the origin of the right subclavian artery and the right common carotid artery to be widely patent. The right common carotid arteriogram demonstrates the right external carotid artery and its major branches to be widely patent. The right internal carotid artery at the bulb to the cranial skull base demonstrates wide patency with mild FMD-like changes involving  the mid cervical right ICA. The petrous, cavernous and the supraclinoid segments are widely patent. A right posterior communicating artery is seen opacifying the right posterior cerebral distribution. Complete occlusion of the right  middle cerebral artery at its origin is seen. The right anterior cerebral artery opacifies into the capillary and venous phases. PROCEDURE: The diagnostic JB 1 catheter in right common carotid artery was exchanged over an 8 [place] 300 cm Rosen exchange guidewire for an 087 balloon guide catheter which had been prepped with 50% contrast and 50% heparinized saline infusion. This was advanced to the right common carotid artery just proximal to the origin of the right internal carotid artery. The guidewire was removed. Good aspiration was obtained from the hub of the balloon guide catheter. Gentle contrast injection demonstrated no evidence of spasms or of intraluminal filling defects. Over a 0.014 inch standard Synchro micro guidewire with a J configuration, the combination of the 021 Trevo ProVue microcatheter inside of a 6 [place] Catalyst 132 cm guide catheter was advanced to the distal end of the balloon guide catheter. With the micro guidewire leading with a J-tip configuration to avoid dissections or inducing spasm, the combination was navigated without difficulty to the supraclinoid right ICA. The occluded right middle cerebral artery was entered with a micro guidewire which was advanced to the M3 regions of the inferior division of the right middle cerebral artery followed by the microcatheter. The guidewire was removed. Good aspiration was obtained from the hub of the microcatheter. Gentle contrast injection demonstrated safe position of tip of the microcatheter. This was then connected to continuous heparinized saline infusion. A 4 mm x 40 mm Solitaire X retrieval device was then advanced to the distal end of the microcatheter. The O ring on the delivery microcatheter was then loosened. With slight forward gentle traction with the right hand on the delivery micro guidewire with left hand the delivery microcatheter was retrieved unsheathing the retrieval device. The Catalyst 6 [place] guide catheter was advanced  to the occluded right middle cerebral artery. With proximal flow arrest obtained by inflating the balloon in the right internal carotid artery, and after constant aspiration using a Penumbra aspiration device at the hub of the 6 [place] Catalyst guide catheter, and with a 60 mL syringe at the hub of the balloon guide catheter, the combination of the retrieval device, the microcatheter, and the 6 [place] Catalyst guide were retrieved and removed as constant aspiration was applied at the hub of the balloon guide catheter. Proximal flow arrest was reversed. A control arteriogram performed through the balloon guide catheter which had been advanced into the mid right internal carotid artery demonstrated complete opacification of the right middle cerebral artery and the inferior division. However, the superior division remained occluded. A second pass was then made again using the above combination. With the micro guidewire leading, access into the M3 region of the superior division was obtained with a microcatheter. The micro guidewire was removed. Good aspiration was established at the hub of the microcatheter. A gentle control arteriogram again demonstrates safe positioning of the tip of the microcatheter which was then connected to continuous heparinized saline infusion. Again with constant flow arrest in the right internal carotid artery, and aspiration at the hub of the Catalyst guide catheter using the Penumbra aspiration device at the origin of the superior division for 2 minutes, and with a 60 mL syringe at the hub of the balloon guide catheter, the combination of the retrieval device, the microcatheter and the  6 [place] Catalyst guide catheter were retrieved and removed. Following reversal of flow arrest, control arteriogram performed through the balloon guide catheter in the right internal carotid artery now demonstrated revascularization of the superior division and also further improved caliber of the inferior  division. There continued to be a very small branch extending into the anterior temporal region which was occluded in the M2 region. Also noted was a distal M3 M4 junction branch occlusion of a parietal branch of the inferior division. A TICI 2C revascularization had been achieved. The right anterior cerebral artery remained widely patent with cross-filling via the anterior communicating artery of the left anterior cerebral A2 segment and distally. The posterior communicating artery remain widely patent. Moderate spasm in the distal cervical right ICA, and the superior division of the right middle cerebral artery responded to 2 aliquots of 25 mcg of nitroglycerin intra-arterially. A final control arteriogram performed through the balloon guide catheter in the right common carotid artery demonstrated significantly improved caliber of the right internal carotid artery, with maintenance of a TICI 2c revascularization of the right middle cerebral artery distribution. The balloon guide catheter was removed. The right groin 8 [place] Pinnacle sheath was then removed with manual compression in the right groin. Distal pulses remained Dopplerable in the dorsalis pedis, and the posterior tibial regions bilaterally unchanged. A flat panel CT of the brain demonstrated heterogeneous hyper attenuation in the right temporal lobe region anteriorly probably representing contrast stain versus less likely hemorrhagic conversion of infarct tissue. There was no mass-effect noted on the right lateral ventricle. Patient was then transferred to the ICU unit for post thrombectomy management. Patient was left intubated on account of his COVID status. IMPRESSION: Endovascular near complete revascularization of the right middle cerebral artery territory with 2 passes with the 4 mm x 40 mm Solitaire X retrieval device and Penumbra aspiration achieving a TICI 2c revascularization. PLAN: Follow-up as per referring MD. Electronically Signed   By:  Luanne Bras M.D.   On: 04/18/2019 10:42   CT HEAD CODE STROKE WO CONTRAST  Result Date: 04/17/2019 CLINICAL DATA:  Code stroke. Left-sided facial droop/weakness, last known well noon yesterday EXAM: CT HEAD WITHOUT CONTRAST TECHNIQUE: Contiguous axial images were obtained from the base of the skull through the vertex without intravenous contrast. COMPARISON:  No pertinent prior studies available for comparison. 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 Subtle petechial hemorrhage is questioned within a region of infarction within the anterior right frontal lobe (series 2, image 19). No significant mass effect or midline shift. No extra-axial fluid collection. Background mild ill-defined hypoattenuation within the cerebral white matter is nonspecific, but consistent with chronic small vessel ischemic disease. Mild generalized parenchymal atrophy. Vascular: Abnormal hyperdensity of the M1 right MCA likely reflecting thrombus. Atherosclerotic calcifications. Skull: No calvarial fracture or suspicious osseous lesion. Sinuses/Orbits: Visualized orbits demonstrate no acute abnormality. Mild paranasal sinus mucosal thickening. No significant mastoid effusion. ASPECTS ([place] Stroke Program Early CT Score) - Ganglionic level infarction (caudate, lentiform nuclei, internal capsule, insula, M1-M3 cortex): 4 (points subtracted for M2, M3 and insula) - Supraganglionic infarction (M4-M6 cortex): 0 Total score (0-10 with 10 being normal): 4 These results were communicated to Dr. Lorraine Lax At 10:38 amon 2/10/2021by text page via the [facility] messaging system. IMPRESSION: 1. Multifocal changes of acute ischemic infarction within the right  MCA vascular territory. ASPECTS 4. Subtle petechial hemorrhage is  questioned within an anterior right frontal lobe infarct site. No significant mass effect. 2. Hyperdensity of the M1 right MCA likely reflecting thrombus. 3. Mild generalized parenchymal atrophy and chronic small vessel ischemic disease. Electronically Signed   By: Kellie Simmering DO   On: 04/17/2019 10:38   VAS [place] LOWER EXTREMITY VENOUS (DVT)  Result Date: 04/18/2019  Lower Venous DVTStudy Indications: Elevated Ddimer.  Risk Factors: COVID 19 positive. Limitations: Bandages, open wound and patient positioning. Comparison Study: No prior studies. Performing Technologist: Oliver Hum RVT  Examination Guidelines: A complete evaluation includes B-mode imaging, spectral Doppler, color Doppler, and power Doppler as needed of all accessible portions of each vessel. Bilateral testing is considered an integral part of a complete examination. Limited examinations for reoccurring indications may be performed as noted. The reflux portion of the exam is performed with the patient in reverse Trendelenburg.  +---------+---------------+---------+-----------+----------+--------------+ RIGHT    CompressibilityPhasicitySpontaneityPropertiesThrombus Aging +---------+---------------+---------+-----------+----------+--------------+ CFV                                                   Not visualized +---------+---------------+---------+-----------+----------+--------------+ SFJ                                                   Not visualized +---------+---------------+---------+-----------+----------+--------------+ FV Prox  Full           Yes      Yes                                 +---------+---------------+---------+-----------+----------+--------------+ FV Mid   Full                                                        +---------+---------------+---------+-----------+----------+--------------+ FV DistalFull                                                         +---------+---------------+---------+-----------+----------+--------------+ PFV      Full                                                        +---------+---------------+---------+-----------+----------+--------------+ POP      Full           Yes      Yes                                 +---------+---------------+---------+-----------+----------+--------------+ PTV      Full                                                        +---------+---------------+---------+-----------+----------+--------------+  PERO     Full                                                        +---------+---------------+---------+-----------+----------+--------------+ Unable to visualize the CFV, SFJ due to bandages.  +---------+---------------+---------+-----------+----------+--------------+ LEFT     CompressibilityPhasicitySpontaneityPropertiesThrombus Aging +---------+---------------+---------+-----------+----------+--------------+ CFV      Full           Yes      Yes                                 +---------+---------------+---------+-----------+----------+--------------+ SFJ      Full                                                        +---------+---------------+---------+-----------+----------+--------------+ FV Prox  Full                                                        +---------+---------------+---------+-----------+----------+--------------+ FV Mid   Full                                                        +---------+---------------+---------+-----------+----------+--------------+ FV DistalFull                                                        +---------+---------------+---------+-----------+----------+--------------+ PFV      Full                                                        +---------+---------------+---------+-----------+----------+--------------+ POP      Full           Yes      Yes                                  +---------+---------------+---------+-----------+----------+--------------+ PTV      Full                                                        +---------+---------------+---------+-----------+----------+--------------+ PERO     Full                                                        +---------+---------------+---------+-----------+----------+--------------+  Gastroc  None                                         Acute          +---------+---------------+---------+-----------+----------+--------------+     Summary: RIGHT: - There is no evidence of deep vein thrombosis in the lower extremity.  - No cystic structure found in the popliteal fossa.  LEFT: - Findings consistent with acute deep vein thrombosis involving the left gastrocnemius veins. - No cystic structure found in the popliteal fossa.  *See table(s) above for measurements and observations. Electronically signed by Servando Snare MD on 04/18/2019 at 6:33:22 PM.    Final    ECHOCARDIOGRAM LIMITED  Result Date: 04/18/2019    ECHOCARDIOGRAM LIMITED REPORT   Patient Name:   ELIZANDRO LAURA Date of Exam: 04/17/2019 Medical Rec #:  [id number]       Height:       68.0 in Accession #:    [id number]      Weight:       180.0 lb Date of Birth:  02-09-1941       BSA:          1.95 m Patient Age:    77 years        BP:           141/51 mmHg Patient Gender: M               HR:           57 bpm. Exam Location:  Inpatient Procedure: Limited Echo and Cardiac Doppler Indications:    Stroke 434.91 / I163.9  History:        Patient has no prior history of Echocardiogram examinations.                 Signs/Symptoms:Chest Pain; Risk Factors:Hypertension. Pneumonia                 due to COVID-19 virus.  Sonographer:    Jaquita Folds Referring Phys: [place]  1. Left ventricular ejection fraction, by estimation, is 55 to 60%. The left ventricle has normal function. The left ventrical has no regional wall motion abnormalities.  Left ventricular diastolic parameters are indeterminate.  2. Right ventricular systolic function is normal. The right ventricular size is normal. There is normal pulmonary artery systolic pressure.  3. The mitral valve is normal in structure and function. trivial mitral valve regurgitation. No evidence of mitral stenosis.  4. The aortic valve is normal in structure and function. Aortic valve regurgitation is not visualized. No aortic stenosis is present. FINDINGS  Left Ventricle: Left ventricular ejection fraction, by estimation, is 55 to 60%. The left ventricle has normal function. The left ventricle has no regional wall motion abnormalities. There is no left ventricular hypertrophy. Right Ventricle: The right ventricular size is normal. No increase in right ventricular wall thickness. Right ventricular systolic function is normal. There is normal pulmonary artery systolic pressure. The tricuspid regurgitant velocity is 1.86 m/s, and  with an assumed right atrial pressure of 3 mmHg, the estimated right ventricular systolic pressure is 95.1 mmHg. Mitral Valve: The mitral valve is normal in structure and function. Trivial mitral valve regurgitation. No evidence of mitral valve stenosis. Tricuspid Valve: The tricuspid valve is grossly normal. Tricuspid valve regurgitation is mild. Aortic Valve: The aortic valve is normal in structure and function.  Aortic valve regurgitation is not visualized. No aortic stenosis is present. Aorta: The aortic root, ascending aorta and aortic arch are all structurally normal, with no evidence of dilitation or obstruction.  LEFT VENTRICLE PLAX 2D LVIDd:         4.60 cm  Diastology LVIDs:         3.60 cm  LV e' lateral:   8.04 cm/s LV PW:         0.90 cm  LV E/e' lateral: 10.1 LV IVS:        0.90 cm  LV e' medial:    5.91 cm/s LVOT diam:     2.00 cm  LV E/e' medial:  13.8 LV SV:         55.61 ml LV SV Index:   21.50 LVOT Area:     3.14 cm  LEFT ATRIUM             Index LA diam:         4.30 cm 2.20 cm/m LA Vol (A2C):   50.7 ml 25.94 ml/m LA Vol (A4C):   43.8 ml 22.41 ml/m LA Biplane Vol: 48.7 ml 24.92 ml/m  AORTIC VALVE LVOT Vmax:   79.40 cm/s LVOT Vmean:  57.900 cm/s LVOT VTI:    0.177 m  AORTA Ao Root diam: 2.70 cm MITRAL VALVE                        TRICUSPID VALVE MV Area (PHT): 2.50 cm             TR Peak grad:   13.8 mmHg MV Decel Time: 303 msec             TR Vmax:        186.00 cm/s MV E velocity: 81.60 cm/s 103 cm/s MV A velocity: 74.40 cm/s 70.3 cm/s SHUNTS MV E/A ratio:  1.10       1.5       Systemic VTI:  0.18 m                                     Systemic Diam: 2.00 cm Mertie Moores MD Electronically signed by Mertie Moores MD Signature Date/Time: 04/18/2019/5:07:36 PM    Final

## 2019-04-23 NOTE — TOC Progression Note (Signed)
Transition of Care [facility]) - Progression Note    Patient Details  Name: Henry [name] MRN: [id number] Date of Birth: 06/27/1940  Transition of [facility]) CM/SW [place], LCSW Phone Number: 04/23/2019, 4:21 PM  Clinical Narrative:    [name] received insurance approval for when patient is medically stable (good for three days).   Expected Discharge Plan: [place] Barriers to Discharge: Continued Medical Work up  Expected Discharge Plan and Services Expected Discharge Plan: [place] In-house Referral: Clinical Social Work Discharge Planning Services: CM Consult Post Acute Care Choice: [place] arrangements for the past 2 months: Single Family Home                 DME Arranged: N/A DME Agency: NA                   Social Determinants of Health (SDOH) Interventions    Readmission Risk Interventions No flowsheet data found.

## 2019-04-23 NOTE — Progress Notes (Signed)
   04/23/19 1300  Family/Significant Other Communication  Family/Significant Other Update Called  updated daughter wendy.

## 2019-04-24 ENCOUNTER — Inpatient Hospital Stay (HOSPITAL_COMMUNITY): Payer: Medicare HMO

## 2019-04-24 LAB — COMPREHENSIVE METABOLIC PANEL
ALT: 102 U/L — ABNORMAL HIGH (ref 0–44)
AST: 105 U/L — ABNORMAL HIGH (ref 15–41)
Albumin: 2.3 g/dL — ABNORMAL LOW (ref 3.5–5.0)
Alkaline Phosphatase: 115 U/L (ref 38–126)
Anion gap: 11 (ref 5–15)
BUN: 18 mg/dL (ref 8–23)
CO2: 24 mmol/L (ref 22–32)
Calcium: 8.8 mg/dL — ABNORMAL LOW (ref 8.9–10.3)
Chloride: 103 mmol/L (ref 98–111)
Creatinine, Ser: 0.72 mg/dL (ref 0.61–1.24)
GFR calc Af Amer: >60 mL/min (ref >60)
GFR calc non Af Amer: >60 mL/min (ref >60)
Glucose, Bld: 110 mg/dL — ABNORMAL HIGH (ref 70–99)
Potassium: 3.8 mmol/L (ref 3.5–5.1)
Sodium: 138 mmol/L (ref 135–145)
Total Bilirubin: 0.9 mg/dL (ref 0.3–1.2)
Total Protein: 7.4 g/dL (ref 6.5–8.1)

## 2019-04-24 LAB — CBC
HCT: 41.4 % (ref 39.0–52.0)
Hemoglobin: 14.3 g/dL (ref 13.0–17.0)
MCH: 36.3 pg — ABNORMAL HIGH (ref 26.0–34.0)
MCHC: 34.5 g/dL (ref 30.0–36.0)
MCV: 105.1 fL — ABNORMAL HIGH (ref 80.0–100.0)
Platelets: 166 10*3/uL (ref 150–400)
RBC: 3.94 MIL/uL — ABNORMAL LOW (ref 4.22–5.81)
RDW: 13.6 % (ref 11.5–15.5)
WBC: 7.4 10*3/uL (ref 4.0–10.5)
nRBC: 0 % (ref 0.0–0.2)

## 2019-04-24 LAB — GLUCOSE, CAPILLARY
Glucose-Capillary: 108 mg/dL — ABNORMAL HIGH (ref 70–99)
Glucose-Capillary: 141 mg/dL — ABNORMAL HIGH (ref 70–99)
Glucose-Capillary: 115 mg/dL — ABNORMAL HIGH (ref 70–99)

## 2019-04-24 LAB — MAGNESIUM: Magnesium: 2.1 mg/dL (ref 1.7–2.4)

## 2019-04-24 LAB — C-REACTIVE PROTEIN: CRP: 3.6 mg/dL — ABNORMAL HIGH (ref <1.0)

## 2019-04-24 LAB — PROCALCITONIN: Procalcitonin: <0.1 ng/mL

## 2019-04-24 LAB — D-DIMER, QUANTITATIVE: D-Dimer, Quant: 3.61 ug/mL-FEU — ABNORMAL HIGH (ref 0.00–0.50)

## 2019-04-24 LAB — TRIGLYCERIDES: Triglycerides: 139 mg/dL (ref <150)

## 2019-04-24 MED ORDER — DILTIAZEM HCL 60 MG PO TABS
60.0000 mg | ORAL_TABLET | Freq: Two times a day (BID) | ORAL | Status: DC
  Administered 2019-04-24 – 2019-04-25 (×3): 60 mg via ORAL
  Filled 2019-04-24 (×3): qty 1

## 2019-04-24 MED ORDER — DOXYCYCLINE HYCLATE 100 MG PO TABS
100.0000 mg | ORAL_TABLET | Freq: Two times a day (BID) | ORAL | Status: DC
  Administered 2019-04-24 – 2019-04-25 (×3): 100 mg via ORAL
  Filled 2019-04-24 (×3): qty 1

## 2019-04-24 MED ORDER — DEXAMETHASONE SODIUM PHOSPHATE 4 MG/ML IJ SOLN
2.0000 mg | Freq: Every day | INTRAMUSCULAR | Status: DC
  Administered 2019-04-24 – 2019-04-25 (×2): 2 mg via INTRAVENOUS
  Filled 2019-04-24 (×2): qty 1

## 2019-04-24 NOTE — Plan of Care (Signed)

## 2019-04-24 NOTE — Progress Notes (Signed)
   04/24/19 1114  Family/Significant Other Communication  Family/Significant Other Update Called  updated daughter wendy on pt status and plan of care

## 2019-04-24 NOTE — Progress Notes (Signed)
PROGRESS NOTE                                                                                                                                                                                                             Patient Demographics:    Henry [name], is a 79 y.o. male, DOB - 12/15/1940, DTO:[id number]  Outpatient Primary MD for the patient is [name], [name], MD   Admit date - 04/17/2019   LOS - 7  CC - L arm weakness     Brief Narrative: Patient is a 79 y.o. male with PMHx of HTN, polycythemia, chronic GERD, chronic low back pain who was admitted on 2/10 following a right MCA stroke-patient was taken by IR for revascularization procedure as patient was outside the TPA window-following which he was admitted to the ICU and required ventilator support.  Patient was also found to have COVID-19.  Upon clinical stability-and extubation-patient was [facility] on 2/12.  Significant events:  2/10>> admitted with acute right MCA stroke-to IR for revascularization of Right MCA-return to ICU on a ventilator 2/11>> extubated 2/12>> transfer to [facility]   Subjective:   Patient sitting in recliner appears to be in no distress, facing towards his right side, appears weak, denies any headache chest or abdominal pain, says he is trying to eat his breakfast, encouraged to finish his meal.  Encouraged to continue to sit in the chair as long as possible.   Assessment  & Plan :   Acute Hypoxic Resp Failure due to Covid 19 Viral pneumonia: Remains stable-on just 1 L of oxygen-he was extubated on 2/12-CRP has significantly jumped up today-he had a fever yesterday afternoon-he is at high risk for aspiration pneumonia.    Sputum cultures positive on 2/10 for MSSA.  He is also status post extubation on 2/12.  Suspect that he may have superimposed aspiration pneumonia - on Ancef.  Finished remdesivir continue  steroids.  Trend procalcitonin clinically improving.  Has been transitioned to oral doxycycline on 04/24/2019, will finish total of 10 days of antibiotic treatment.  Stop date 04/28/2019.  O2 requirements:  SpO2: 92 % O2 Flow Rate (L/min): 2 L/min FiO2 (%): 28 %   COVID-19 Labs: Recent Labs    04/22/19 1205 04/23/19 0416 04/24/19 0531  DDIMER 3.30* 3.26* 3.61*  CRP 6.9* 4.5*  3.6*    No results found for: BNP  Recent Labs  Lab 04/17/19 1630 04/21/19 0345 04/23/19 0416 04/24/19 0531  PROCALCITON 0.10 0.17 0.12 <0.10    Lab Results  Component Value Date   SARSCOV2NAA POSITIVE (A) 04/16/2019     COVID-19 Medications: Steroids: 2/10>> Remdesivir: 2/10>>  Antibiotics: Ancef: 2/13>> stop on 04/23/2019 and monitor    Clinical dehydration.  Gentle IV fluids on 04/23/2019, appears slightly withdrawn, encourage staff to put him in the chair and offer him more food.  Continue PT OT.    Left lower extremity DVT: Likely provoked by COVID-19-on IV heparin >> Eliquis.  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  Patient is s/p revascularization of right M1 occlusion by interventional radiology.  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  Echo with preserved EF and without any embolic foci.  LDL 53, A1c 6.8.  Has gross dysarthria, left facial droop, and significant weakness in the left upper extremity.  Stroke MD now switched to Eliquis, continue PT OT, still has dense left upper extremity hemiparesis, also ordered 30-day event monitor post discharge per neuro recommendation.  LDL and A1c at goal for his age.  Lab Results  Component Value Date   CHOL 93 04/18/2019   HDL 19 (L) 04/18/2019   LDLCALC 53 04/18/2019   LDLDIRECT 135.0 02/24/2017   TRIG 139 04/24/2019   CHOLHDL 4.9 04/18/2019    Lab Results  Component Value Date   HGBA1C 6.8 (H) 04/17/2019    Dysphagia: Secondary to CVA-followed by SLP-on  dysphagia 1 diet.  Maintain aspiration precautions, stop antibiotics on 04/23/2019 and monitor closely.  Will trend procalcitonin.  HTN: BP controlled-not on any antihypertensives.  Allow permissive hypertension.    Consults  :  Neuro/PCCM, IR  Procedures  :    ETT:2/10>>2/11 Revascularization of right proximal MCA 2/10   TTE -  1. Left ventricular ejection fraction, by estimation, is 55 to 60%. The left ventricle has normal function. The left ventrical has no regional wall motion abnormalities. Left ventricular diastolic parameters are  indeterminate.  2. Right ventricular systolic function is normal. The right ventricular size is normal. There is normal pulmonary artery systolic pressure.  3. The mitral valve is normal in structure and function. trivial mitral valve regurgitation. No evidence of mitral stenosis.  4. The aortic valve is normal in structure and function. Aortic valve regurgitation is not visualized. No aortic stenosis is present.    Condition -   Guarded  Family Communication  :    Daughter updated over the phone 04/21/2019.  Called the daughter again on 04/23/2019, informed her that her dad today is slightly withdrawn and appears to be slightly more negative in terms of saying that his body is giving up, daughter abruptly changed her demeanor and raised her voice and told me that you always give me bad news, I reminded her that I have only talked to her once before and we have not had multiple conversations in the conversation on the 14th was actually positive as he was doing better.  However she says that henceforth she would not like to talk to [place] as we give her bad news.  I again asked her how many times she has talk to me and she says about 5-6 times, I then informed her that we only had 2 conversations, I started taking care of her dad on the 14th and there is no way I  have talked to her 5-6 times on 5-6 different days, I think she is sadly mistaken and has talked  to different physicians, she then says that she does not like to talk to physicians anyways because all the physicians have been negative.   Code Status :  Full Code  Diet :  Diet Order            DIET - DYS 1 Room service appropriate? No; Fluid consistency: Nectar Thick  Diet effective now               Disposition Plan  : SNF discharge likely in the morning if tolerates oral antibiotics continues to show clinical improvement  Antimicorbials  :    Anti-infectives (From admission, onward)   Start     Dose/Rate Route Frequency Ordered Stop   04/24/19 1000  doxycycline (VIBRA-TABS) tablet 100 mg     100 mg Oral Every 12 hours 04/24/19 0737     04/20/19 1615  ceFAZolin (ANCEF) IVPB 1 g/50 mL premix  Status:  Discontinued     1 g 100 mL/hr over 30 Minutes Intravenous Every 8 hours 04/20/19 1602 04/23/19 0926   04/18/19 1000  remdesivir 100 mg in sodium chloride 0.9 % 100 mL IVPB     100 mg 200 mL/hr over 30 Minutes Intravenous Daily 04/17/19 1457 04/21/19 1024   04/17/19 1630  remdesivir 200 mg in sodium chloride 0.9% 250 mL IVPB     200 mg 580 mL/hr over 30 Minutes Intravenous Once 04/17/19 1457 04/17/19 1908   04/17/19 1128  ceFAZolin (ANCEF) 2-4 GM/100ML-% IVPB    Note to Pharmacy: Fredric Dine   : cabinet override      04/17/19 1128 04/17/19 1543     DVT Prophylaxis  : IV heparin  Inpatient Medications  Scheduled Meds: .  stroke: mapping our early stages of recovery book   Does not apply Once  . apixaban  5 mg Oral BID  . vitamin C  500 mg Per Tube Daily  . chlorhexidine gluconate (MEDLINE KIT)  15 mL Mouth Rinse BID  . dexamethasone (DECADRON) injection  2 mg Intravenous Daily  . diltiazem  60 mg Oral Q12H  . docusate sodium  200 mg Oral BID  . doxycycline  100 mg Oral Q12H  . folic acid  1 mg Intravenous Daily  . insulin aspart  0-9 Units Subcutaneous TID WC  . polyethylene glycol  17 g Oral BID  . thiamine injection  100 mg Intravenous Daily  . zinc sulfate   220 mg Per Tube Daily   Continuous Infusions:  PRN Meds:.[DISCONTINUED] acetaminophen **OR** acetaminophen (TYLENOL) oral liquid 160 mg/5 mL **OR** [DISCONTINUED] acetaminophen, [DISCONTINUED] acetaminophen **OR** acetaminophen (TYLENOL) oral liquid 160 mg/5 mL **OR** [DISCONTINUED] acetaminophen, hydrALAZINE, iohexol, Resource ThickenUp Clear, senna-docusate   Time Spent in minutes  25  See all Orders from today for further details   Lala Lund M.D on 04/24/2019 at 10:38 AM  To page go to [email or web address] - use universal password  [facility] -  Office  [phone]    Objective:   Vitals:   04/24/19 0240 04/24/19 0400 04/24/19 0600 04/24/19 0900  BP:  131/73  110/77  Pulse: 61 62  83  Resp: 20 (!) 21 18 (!) 21  Temp:  99.4 F (37.4 C)    TempSrc:      SpO2: 92% 90%  92%  Weight:        Wt Readings from Last 3 Encounters:  04/18/19 80.1  kg  04/16/19 81.6 kg  08/24/18 81.6 kg     Intake/Output Summary (Last 24 hours) at 04/24/2019 1038 Last data filed at 04/24/2019 0617 Gross per 24 hour  Intake 1739.38 ml  Output 1970 ml  Net -230.62 ml     Physical Exam  Awake Alert, left-sided facial droop and arm weakness, arm strength 1/5, leg strength 3/5, facial droop seems to have slightly improved, continues to have severe left-sided hemineglect [place].AT,PERRAL Supple Neck,No JVD, No cervical lymphadenopathy appriciated.  Symmetrical Chest wall movement, Good air movement bilaterally, CTAB RRR,No Gallops, Rubs or new Murmurs, No Parasternal Heave +ve B.Sounds, Abd Soft, No tenderness, No organomegaly appriciated, No rebound - guarding or rigidity. No Cyanosis, Clubbing or edema, No new Rash or bruise    Data Review:    CBC Recent Labs  Lab 04/18/19 0822 04/19/19 0215 04/20/19 0826 04/21/19 0345 04/22/19 1205 04/23/19 0416 04/24/19 0531  WBC 6.4   < > 10.2 7.9 6.7 6.5 7.4  HGB 11.5*   < > 11.5* 11.4* 12.1* 12.4* 14.3  HCT 33.9*   < > 32.9* 34.0*  36.5* 36.8* 41.4  PLT 134*   < > 139* 158 162 175 166  MCV 108.0*   < > 107.5* 110.7* 110.3* 108.6* 105.1*  MCH 36.6*   < > 37.6* 37.1* 36.6* 36.6* 36.3*  MCHC 33.9   < > 35.0 33.5 33.2 33.7 34.5  RDW 14.0   < > 13.6 14.0 14.1 13.8 13.6  LYMPHSABS 1.0  --   --   --   --   --   --   MONOABS 0.7  --   --   --   --   --   --   EOSABS 0.0  --   --   --   --   --   --   BASOSABS 0.0  --   --   --   --   --   --    < > = values in this interval not displayed.    Chemistries  Recent Labs  Lab 04/20/19 0826 04/21/19 0345 04/22/19 1205 04/23/19 0416 04/24/19 0531  NA 137 137 140 139 138  K 4.0 4.2 3.6 4.4 3.8  CL 106 109 105 104 103  CO2 _0 GLUCOSE 116* 135* 104* 102* 110*  BUN _1 CREATININE 0.72 0.67 0.67 0.73 0.72  CALCIUM 8.3* 8.4* 8.4* 8.5* 8.8*  MG 1.9 2.0 1.9 2.0 2.1  AST 42* 30 63* 70* 105*  ALT 41 36 49* 64* 102*  ALKPHOS 80 86 78 97 115  BILITOT 1.3* 0.7 0.6 0.8 0.9   ------------------------------------------------------------------------------------------------------------------ Recent Labs    04/23/19 0416 04/24/19 0531  TRIG 147 139    Lab Results  Component Value Date   HGBA1C 6.8 (H) 04/17/2019   ------------------------------------------------------------------------------------------------------------------ No results for input(s): TSH, T4TOTAL, T3FREE, THYROIDAB in the last 72 hours.  Invalid input(s): FREET3 ------------------------------------------------------------------------------------------------------------------ No results for input(s): VITAMINB12, FOLATE, FERRITIN, TIBC, IRON, RETICCTPCT in the last 72 hours.  Coagulation profile Recent Labs  Lab 04/17/19 1630  INR 1.2    Recent Labs    04/23/19 0416 04/24/19 0531  DDIMER 3.26* 3.61*    Cardiac Enzymes No results for input(s): CKMB, TROPONINI, MYOGLOBIN in the last 168 hours.  Invalid input(s):  CK ------------------------------------------------------------------------------------------------------------------ No results found for: BNP  Micro Results Recent Results (from the past 240 hour(s))  Culture, blood (routine x 2)  Status: None   Collection Time: 04/16/19  1:45 PM   Specimen: BLOOD  Result Value Ref Range Status   Specimen Description BLOOD LEFT ANTECUBITAL  Final   Special Requests   Final    BOTTLES DRAWN AEROBIC AND ANAEROBIC Blood Culture results may not be optimal due to an inadequate volume of blood received in culture bottles   Culture   Final    NO GROWTH 5 DAYS Performed at [facility], [place] [address], [place], [place] [address]    Report Status 04/21/2019 FINAL  Final  Culture, blood (routine x 2)     Status: None   Collection Time: 04/16/19  2:49 PM   Specimen: BLOOD RIGHT HAND  Result Value Ref Range Status   Specimen Description BLOOD RIGHT HAND  Final   Special Requests   Final    BOTTLES DRAWN AEROBIC AND ANAEROBIC Blood Culture results may not be optimal due to an inadequate volume of blood received in culture bottles   Culture   Final    NO GROWTH 5 DAYS Performed at [facility], [place] [address], [place], [place] [address]    Report Status 04/21/2019 FINAL  Final  Urine culture     Status: Abnormal   Collection Time: 04/16/19  4:45 PM   Specimen: Urine, Random  Result Value Ref Range Status   Specimen Description URINE, RANDOM  Final   Special Requests NONE  Final   Culture (A)  Final    <10,000 COLONIES/mL INSIGNIFICANT GROWTH Performed at [facility], [place] [address] [place], [place] [address]    Report Status 04/17/2019 FINAL  Final  Respiratory Panel by RT PCR (Flu A&B, Covid) -     Status: Abnormal   Collection Time: 04/16/19  5:30 PM  Result Value Ref Range Status   SARS Coronavirus 2 by RT PCR POSITIVE (A) NEGATIVE Final    Comment: RESULT CALLED TO, READ BACK BY AND VERIFIED WITH: Dois Davenport RN [id number] 04/16/19 A  BROWNING (NOTE) SARS-CoV-2 target nucleic acids are DETECTED. SARS-CoV-2 RNA is generally detectable in upper respiratory specimens  during the acute phase of infection. Positive results are indicative of the presence of the identified virus, but do not rule out bacterial infection or co-infection with other pathogens not detected by the test. Clinical correlation with patient history and other diagnostic information is necessary to determine patient infection status. The expected result is Negative. Fact Sheet for Patients:  PinkCheek.be Fact Sheet for Healthcare Providers: GravelBags.it This test is not yet approved or cleared by the [place] FDA and  has been authorized for detection and/or diagnosis of SARS-CoV-2 by FDA under an Emergency Use Authorization (EUA).  This EUA will remain in effect (meaning this test can be used) fo r the duration of  the COVID-19 declaration under Section 564(b)(1) of the Act, 21 U.S.C. section 360bbb-3(b)(1), unless the authorization is terminated or revoked sooner.    Influenza A by PCR NEGATIVE NEGATIVE Final   Influenza B by PCR NEGATIVE NEGATIVE Final    Comment: (NOTE) The Xpert Xpress SARS-CoV-2/FLU/RSV assay is intended as an aid in  the diagnosis of influenza from Nasopharyngeal swab specimens and  should not be used as a sole basis for treatment. Nasal washings and  aspirates are unacceptable for Xpert Xpress SARS-CoV-2/FLU/RSV  testing. Fact Sheet for Patients: PinkCheek.be Fact Sheet for Healthcare Providers: GravelBags.it This test is not yet approved or cleared by the [place] FDA and  has been  authorized for detection and/or diagnosis of SARS-CoV-2 by  FDA under an Emergency Use Authorization (EUA). This EUA will remain  in effect (meaning this test can be used) for the duration of the  Covid-19 declaration  under Section 564(b)(1) of the Act, 21  U.S.C. section 360bbb-3(b)(1), unless the authorization is  terminated or revoked. Performed at [facility], [place] [address], [place], [place] [address]   Culture, respiratory (non-expectorated)     Status: None   Collection Time: 04/17/19  5:05 PM   Specimen: Tracheal Aspirate; Respiratory  Result Value Ref Range Status   Specimen Description TRACHEAL ASPIRATE  Final   Special Requests   Final    NONE Performed at [facility], [place] [address], [place], [place] [address]    Gram Stain NO WBC SEEN NO ORGANISMS SEEN   Final   Culture RARE STAPHYLOCOCCUS AUREUS  Final   Report Status 04/20/2019 FINAL  Final   Organism ID, Bacteria STAPHYLOCOCCUS AUREUS  Final      Susceptibility   Staphylococcus aureus - MIC*    CIPROFLOXACIN <=0.5 SENSITIVE Sensitive     ERYTHROMYCIN <=0.25 SENSITIVE Sensitive     GENTAMICIN <=0.5 SENSITIVE Sensitive     OXACILLIN 0.5 SENSITIVE Sensitive     TETRACYCLINE <=1 SENSITIVE Sensitive     VANCOMYCIN <=0.5 SENSITIVE Sensitive     TRIMETH/SULFA <=10 SENSITIVE Sensitive     CLINDAMYCIN <=0.25 SENSITIVE Sensitive     RIFAMPIN <=0.5 SENSITIVE Sensitive     Inducible Clindamycin NEGATIVE Sensitive     * RARE STAPHYLOCOCCUS AUREUS  MRSA PCR Screening     Status: None   Collection Time: 04/20/19  9:24 AM   Specimen: Nasal Mucosa; Nasopharyngeal  Result Value Ref Range Status   MRSA by PCR NEGATIVE NEGATIVE Final    Comment:        The GeneXpert MRSA Assay (FDA approved for NASAL specimens only), is one component of a comprehensive MRSA colonization surveillance program. It is not intended to diagnose MRSA infection nor to guide or monitor treatment for MRSA infections. Performed at [facility], [place] [address] [place], [place] [address]     Radiology Reports CT ANGIO HEAD W OR WO CONTRAST  Result Date: 04/17/2019 CLINICAL DATA:  Stroke, follow-up. EXAM: CT ANGIOGRAPHY HEAD AND NECK CT  PERFUSION BRAIN TECHNIQUE: Multidetector CT imaging of the head and neck was performed using the standard protocol during bolus administration of intravenous contrast. Multiplanar CT image reconstructions and MIPs were obtained to evaluate the vascular anatomy. Carotid stenosis measurements (when applicable) are obtained utilizing NASCET criteria, using the distal internal carotid diameter as the denominator. Multiphase CT imaging of the brain was performed following IV bolus contrast injection. Subsequent parametric perfusion maps were calculated using RAPID software. CONTRAST:  117m OMNIPAQUE IOHEXOL 350 MG/ML SOLN COMPARISON:  Noncontrast head CT performed earlier the same day. FINDINGS: CTA NECK FINDINGS Aortic arch: The origins of the innominate and left common carotid arteries are incompletely imaged. The visualized aortic arch is unremarkable. No significant innominate or proximal subclavian stenosis visualized. Right carotid system: CCA patent to the bifurcation without stenosis. Prominent predominantly calcified plaque within the distal common carotid artery and proximal ICA. There is likely greater than 70% stenosis at the origin of the ICA, although exact quantification of stenosis is difficult due to irregularity of calcified plaque. Distal to this the ICA is patent within the neck without stenosis. Left carotid system: CCA patent to the bifurcation without stenosis. Calcified  plaque within the distal common carotid artery, carotid bifurcation and proximal ICA. Resultant 30-40% narrowing of the proximal ICA as compared to the more distal vessel. Distal to this the ICA is patent within the neck without stenosis. Vertebral arteries: Right vertebral artery dominant. 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 Calcified plaque at the origin of the medial V1 left vertebral artery branch with at least moderate ostial stenosis. More distally the bilateral vertebral arteries are patent within the neck without  stenosis. Skeleton: No acute bony abnormality. Cervical spondylosis with C5-C6 and C6-C7 posterior disc osteophytes. Other neck: No neck mass or cervical adenopathy. Upper chest: Paraseptal emphysema within the imaged lung apices. Also within the imaged lung apices but there is interstitial prominence suspicious for interstitial lung disease. There are more nodular opacities within the right upper lobe measuring up to 1.4 cm (series 7, image 56) (series 7, image 99). Review of the MIP images confirms the above findings CTA HEAD FINDINGS Anterior circulation: The intracranial internal carotid arteries are patent with scattered calcified plaque. No more than mild stenosis within these vessels. 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 There is minimal reconstitution of flow seen within M2 and more distal right MCA branch vessels. The left middle cerebral artery is patent without high-grade proximal stenosis. The bilateral anterior cerebral arteries are patent without significant proximal stenosis. Posterior circulation: The intracranial vertebral arteries are patent without significant stenosis, as is the basilar artery. The bilateral posterior cerebral arteries are patent without significant proximal stenosis. A sizable right posterior communicating artery is present. The a left posterior communicating artery is poorly delineated and may be hypoplastic or absent. Venous sinuses: Within limitations of contrast timing, no convincing thrombus. Anatomic variants: As described Review of the MIP images confirms the above findings CT Brain Perfusion Findings: ASPECTS: 4 CBF (<30%) Volume: 102m (in the right MCA vascular territory) Perfusion (Tmax>6.0s) volume: 147 mL (in the right MCA vascular territory) Mismatch Volume: 1335mInfarction Location:Right MCA vascular territory Findings of a proximal M1 right MCA occlusion and the perfusion results were discussed by telephone at the time of interpretation on  04/17/2019 at 10:38 am to provider Dr. ArLorraine Laxwho verbally acknowledged these results. IMPRESSION: CTA neck: 1. The common and internal carotid arteries are patent within the neck bilaterally. 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 There is likely greater than 70% stenosis at the origin of the right ICA. 30-40% stenosis of the proximal left ICA. 2. The vertebral arteries are patent within the neck bilaterally. The right vertebral artery is dominant with moderate atherosclerotic narrowing at its origin. Duplicated V1 left vertebral artery. There is at least moderate ostial stenosis at the origin of the medial left V1 vertebral artery branch. 3. The partially imaged lung apices demonstrate findings suspicious for interstitial lung disease. Additionally, there are nodular opacities within the right lung apex measuring up to 1.4 cm. Dedicated chest CT is recommended for further evaluation when clinically feasible. CTA head: 1. 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 There is minimal reconstitution of flow within M2 and more distal right MCA branch vessels. 2. No other intracranial large vessel occlusion or proximal high-grade arterial stenosis identified. CT perfusion head: The perfusion software identifies a 10 mL core infarct within the right MCA vascular territory. However, please note more extensive infarction changes were appreciated on concurrently performed noncontrast head CT. The perfusion software detects a 147 mL region of hypoperfused parenchyma within the right MCA vascular territory.  Reported mismatch 137 mL. Electronically Signed   By: Kellie Simmering DO   On: 04/17/2019 11:04   CT HEAD WO CONTRAST  Result Date: 04/22/2019 CLINICAL DATA:  Stroke, follow-up. EXAM: CT HEAD WITHOUT CONTRAST TECHNIQUE: Contiguous axial images were obtained from the base of the skull through the vertex without intravenous contrast. COMPARISON:  MRI/MRA head 04/19/2019, head CT  04/17/2019 FINDINGS: Brain: A large, now early subacute, infarct involving much of the right MCA vascular territory has not significantly changed in extent as compared to prior MRI 04/19/2019. There is involvement of the cortical and subcortical brain, right basal ganglia, thalamus as well as internal and external capsules. Redemonstrated petechial hemorrhage within portions of the infarct territory. There is regional mass effect with partial effacement of the right lateral ventricle. No midline shift. No extra-axial fluid collection. No evidence of intracranial mass. Small infarcts within the left hemispheric white matter better appreciated on prior MRI. Vascular: No hyperdense vessel.  Atherosclerotic calcifications. Skull: Normal. Negative for fracture or focal lesion. Sinuses/Orbits: Visualized orbits demonstrate no acute abnormality. Mild bilateral maxillary sinus mucosal thickening. No significant mastoid effusion. IMPRESSION: A large, now early subacute, infarct involving much of the right MCA vascular territory has not significantly changed in extent. Redemonstrated associated petechial hemorrhage. Regional mass effect with partial effacement of the right lateral ventricle. No midline shift. Small infarcts within the left hemispheric white matter were better appreciated on prior MRI 04/19/2019. Electronically Signed   By: Kellie Simmering DO   On: 04/22/2019 07:36   CT HEAD WO CONTRAST  Result Date: 04/17/2019 CLINICAL DATA:  Follow-up intervention for right MCA occlusion. EXAM: CT HEAD WITHOUT CONTRAST TECHNIQUE: Contiguous axial images were obtained from the base of the skull through the vertex without intravenous contrast. COMPARISON:  Interventional images same day.  Head CT same day. FINDINGS: Brain: In the region the small core infarction in the lateral temporal lobe and the penumbra of the majority of the right middle cerebral artery cortical territory, there is brain swelling with effacement of the  sulci and areas of patchy low-density as well as some areas of increased attenuation. Low-density present within the right basal ganglia. The appearance of the increased attenuation is more consistent with post procedural contrast staining than intraparenchymal hemorrhage. Hemorrhage is not excluded but not favored. Certainly, there is no frank hematoma. No evidence of midline shift. No hydrocephalus. No extra-axial collection. Vascular: No acute vascular finding primarily. Skull: Negative Sinuses/Orbits: Clear except for mild mucosal thickening. Other: None IMPRESSION: Ischemic changes in the right MCA territory affecting the cortical brain, subcortical brain and right basal ganglia. Areas of low-density as well as areas of indistinct higher density favored to represent contrast staining rather than frank hemorrhage. Low level petechial bleeding is not excluded. There is no discrete hematoma. No mass effect or shift, despite mild-to-moderate swelling in the region of affected brain. Electronically Signed   By: Nelson Chimes M.D.   On: 04/17/2019 20:35   CT ANGIO NECK W OR WO CONTRAST  Result Date: 04/17/2019 CLINICAL DATA:  Stroke, follow-up. EXAM: CT ANGIOGRAPHY HEAD AND NECK CT PERFUSION BRAIN TECHNIQUE: Multidetector CT imaging of the head and neck was performed using the standard protocol during bolus administration of intravenous contrast. Multiplanar CT image reconstructions and MIPs were obtained to evaluate the vascular anatomy. Carotid stenosis measurements (when applicable) are obtained utilizing NASCET criteria, using the distal internal carotid diameter as the denominator. Multiphase CT imaging of the brain was performed following IV bolus contrast injection. Subsequent  parametric perfusion maps were calculated using RAPID software. CONTRAST:  145m OMNIPAQUE IOHEXOL 350 MG/ML SOLN COMPARISON:  Noncontrast head CT performed earlier the same day. FINDINGS: CTA NECK FINDINGS Aortic arch: The origins of  the innominate and left common carotid arteries are incompletely imaged. The visualized aortic arch is unremarkable. No significant innominate or proximal subclavian stenosis visualized. Right carotid system: CCA patent to the bifurcation without stenosis. Prominent predominantly calcified plaque within the distal common carotid artery and proximal ICA. There is likely greater than 70% stenosis at the origin of the ICA, although exact quantification of stenosis is difficult due to irregularity of calcified plaque. Distal to this the ICA is patent within the neck without stenosis. Left carotid system: CCA patent to the bifurcation without stenosis. Calcified plaque within the distal common carotid artery, carotid bifurcation and proximal ICA. Resultant 30-40% narrowing of the proximal ICA as compared to the more distal vessel. Distal to this the ICA is patent within the neck without stenosis. Vertebral arteries: Right vertebral artery dominant. 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 Calcified plaque at the origin of the medial V1 left vertebral artery branch with at least moderate ostial stenosis. More distally the bilateral vertebral arteries are patent within the neck without stenosis. Skeleton: No acute bony abnormality. Cervical spondylosis with C5-C6 and C6-C7 posterior disc osteophytes. Other neck: No neck mass or cervical adenopathy. Upper chest: Paraseptal emphysema within the imaged lung apices. Also within the imaged lung apices but there is interstitial prominence suspicious for interstitial lung disease. There are more nodular opacities within the right upper lobe measuring up to 1.4 cm (series 7, image 56) (series 7, image 99). Review of the MIP images confirms the above findings CTA HEAD FINDINGS Anterior circulation: The intracranial internal carotid arteries are patent with scattered calcified plaque. No more than mild stenosis within these vessels. The M1 right MCA is occluded shortly beyond its  origin. There is minimal reconstitution of flow seen within M2 and more distal right MCA branch vessels. The left middle cerebral artery is patent without high-grade proximal stenosis. The bilateral anterior cerebral arteries are patent without significant proximal stenosis. Posterior circulation: The intracranial vertebral arteries are patent without significant stenosis, as is the basilar artery. The bilateral posterior cerebral arteries are patent without significant proximal stenosis. A sizable right posterior communicating artery is present. The a left posterior communicating artery is poorly delineated and may be hypoplastic or absent. Venous sinuses: Within limitations of contrast timing, no convincing thrombus. Anatomic variants: As described Review of the MIP images confirms the above findings CT Brain Perfusion Findings: ASPECTS: 4 CBF (<30%) Volume: 147m(in the right MCA vascular territory) Perfusion (Tmax>6.0s) volume: 147 mL (in the right MCA vascular territory) Mismatch Volume: 1375mnfarction Location:Right MCA vascular territory Findings of a proximal M1 right MCA occlusion and the perfusion results were discussed by telephone at the time of interpretation on 04/17/2019 at 10:38 am to provider Dr. AroLorraine Laxho verbally acknowledged these results. IMPRESSION: CTA neck: 1. The common and internal carotid arteries are patent within the neck bilaterally. 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 There is likely greater than 70% stenosis at the origin of the right ICA. 30-40% stenosis of the proximal left ICA. 2. The vertebral arteries are patent within the neck bilaterally. The right vertebral artery is dominant with moderate atherosclerotic narrowing at its origin. Duplicated V1 left vertebral artery. There is at least moderate ostial stenosis at the origin of the medial left V1 vertebral artery  branch. 3. The partially imaged lung apices demonstrate findings suspicious for  interstitial lung disease. Additionally, there are nodular opacities within the right lung apex measuring up to 1.4 cm. Dedicated chest CT is recommended for further evaluation when clinically feasible. CTA head: 1. 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 There is minimal reconstitution of flow within M2 and more distal right MCA branch vessels. 2. No other intracranial large vessel occlusion or proximal high-grade arterial stenosis identified. CT perfusion head: The perfusion software identifies a 10 mL core infarct within the right MCA vascular territory. However, please note more extensive infarction changes were appreciated on concurrently performed noncontrast head CT. The perfusion software detects a 147 mL region of hypoperfused parenchyma within the right MCA vascular territory. Reported mismatch 137 mL. Electronically Signed   By: Kellie Simmering DO   On: 04/17/2019 11:04   MR ANGIO HEAD WO CONTRAST  Result Date: 04/19/2019 CLINICAL DATA:  Follow-up right MCA occlusion with intervention. EXAM: MRI HEAD WITHOUT CONTRAST MRA HEAD WITHOUT CONTRAST TECHNIQUE: Multiplanar, multiecho pulse sequences of the brain and surrounding structures were obtained without intravenous contrast. Angiographic images of the head were obtained using MRA technique without contrast. 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 Differences in diffusion appearance is attributed to artifact from petechial hemorrhage. There is progressive swelling with partial effacement of the right lateral ventricle. Midline shift only measures 2 mm. No entrapment. Confluent petechial hemorrhage is seen in the infarcted area. There are a few punctate acute infarcts in the left occipital parietal region, also seen prior. No extra-axial collection. Vascular: Arterial findings  below. Skull and upper cervical spine: Negative Sinuses/Orbits: Negative MRA HEAD FINDINGS Motion degraded. Symmetric carotid and vertebral arteries. Dominant left PICA and right AICA. The vertebral, carotid, and basilar arteries are smooth and widely patent. No branch occlusion or aneurysm. Accounting for motion there is symmetric flow in the right MCA. IMPRESSION: 1. 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 There is progressive swelling with up to 2 mm of midline shift. 2. Redemonstrated minimal acute infarct in left hemispheric white matter. 3. Negative motion degraded MRA.  The right MCA is patent. Electronically Signed   By: Monte Fantasia M.D.   On: 04/19/2019 11:50   MR BRAIN WO CONTRAST  Result Date: 04/19/2019 CLINICAL DATA:  Follow-up right MCA occlusion with intervention. EXAM: MRI HEAD WITHOUT CONTRAST MRA HEAD WITHOUT CONTRAST TECHNIQUE: Multiplanar, multiecho pulse sequences of the brain and surrounding structures were obtained without intravenous contrast. Angiographic images of the head were obtained using MRA technique without contrast. 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 Differences in diffusion appearance is attributed to artifact from petechial hemorrhage. There is progressive swelling with partial effacement of the right lateral ventricle. Midline shift only measures 2 mm. No entrapment. Confluent petechial hemorrhage is seen in the infarcted area. There are a few punctate acute infarcts in the left occipital parietal region, also seen prior. No extra-axial collection. Vascular: Arterial findings below. Skull and upper cervical spine: Negative Sinuses/Orbits: Negative MRA HEAD FINDINGS Motion degraded. Symmetric carotid and vertebral arteries. Dominant left PICA  and right AICA. The vertebral,  carotid, and basilar arteries are smooth and widely patent. No branch occlusion or aneurysm. Accounting for motion there is symmetric flow in the right MCA. IMPRESSION: 1. 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 There is progressive swelling with up to 2 mm of midline shift. 2. Redemonstrated minimal acute infarct in left hemispheric white matter. 3. Negative motion degraded MRA.  The right MCA is patent. Electronically Signed   By: Monte Fantasia M.D.   On: 04/19/2019 11:50   MR BRAIN WO CONTRAST  Result Date: 04/17/2019 CLINICAL DATA:  Focal neuro deficit, greater than 6 hours, stroke suspected. EXAM: MRI HEAD WITHOUT CONTRAST TECHNIQUE: Multiplanar, multiecho pulse sequences of the brain and surrounding structures were obtained without intravenous contrast. COMPARISON:  Noncontrast CT head, CT angiogram head/neck and CT perfusion performed earlier the same day 04/17/2019 FINDINGS: Brain: A limited protocol brain MRI was performed at the ordering provider's request consisting of only axial diffusion-weighted imaging and axial T2 FLAIR imaging. There is extensive multifocal restricted diffusion within the right MCA vascular territory, consistent with acute ischemic changes and involving portions of the right frontal, parietal, temporal and occipital lobes as well as right insular cortex, posterior limb of right internal capsule, right lentiform nucleus, right caudate body and right corona radiata. Of note, this includes acute ischemic change within the right motor strip. The largest region of diffusion weighted signal abnormality within the right temporal lobe measures 7.8 x 3.0 cm in transaxial dimensions (series 4, image 23). There is T2/FLAIR hyperintensity which corresponds with some, but not all, of the acute ischemic change. For instance, there is little to no T2/FLAIR hyperintensity corresponding with the  ischemic changes within the right corona radiata, basal ganglia and right insula at this time. Punctate acute cortical infarct within the left parietooccipital lobe (series 4, image 33). Additional probable punctate acute infarcts more superiorly within the left parietal lobe (series 4, image 35) and within the lateral left occipital lobe (series 4, image 24). Vascular: Poorly assessed on the acquired sequences. Skull and upper cervical spine: No evidence of focal marrow lesion on the acquired sequences. Sinuses/Orbits: Visualized orbits demonstrate no acute abnormality. Minimal ethmoid sinus mucosal thickening. No significant mastoid effusion. IMPRESSION: Extensive restricted diffusion consistent with acute ischemia involving the right MCA vascular territory. There is T2/FLAIR hyperintensity which corresponds with some, but not all, of the acute ischemic changes as described. Additional punctate acute infarcts within the left parietal and occipital lobes. Electronically Signed   By: Kellie Simmering DO   On: 04/17/2019 12:50   IR CT Head Ltd  Result Date: 04/17/2019 INDICATION: New onset of left-sided weakness with right gaze deviation and dysarthria.  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  EXAM: 1. EMERGENT LARGE VESSEL OCCLUSION THROMBOLYSIS (anterior CIRCULATION)  COMPARISON:  CT angiogram of the head and neck of April 17, 2019.  MEDICATIONS: Ancef 2 g IV antibiotic was administered within 1 hour of the procedure.  ANESTHESIA/SEDATION: General anesthesia.  CONTRAST:  Isovue 300 approximately 65 mL.  FLUOROSCOPY TIME:  Fluoroscopy Time: 41 minutes 18 seconds (1531 mGy).  COMPLICATIONS: None immediate.  TECHNIQUE: Following a full explanation of the procedure along with the potential associated complications, an informed witnessed consent was obtained from the patient's wife and daughter. The risks of intracranial hemorrhage of 10%, worsening neurological  deficit, ventilator dependency, death and inability to revascularize were all reviewed in detail with the patient's wife and daughter.  The  patient was then put under general anesthesia by the Department of Anesthesiology at [facility].  The right groin was prepped and draped in the usual sterile fashion. Thereafter using modified Seldinger technique, transfemoral access into the right common femoral artery was obtained without difficulty. Over a 0.035 inch guidewire an 8 [place] Pinnacle sheath was inserted. Through this, and also over a 0.035 inch guidewire a 5 [place] JB 1 catheter was advanced to the aortic arch region and selectively positioned in the innominate artery and the right common carotid artery.  FINDINGS: The innominate artery arteriogram demonstrates the origin of the right subclavian artery and the right common carotid artery to be widely patent.  The right common carotid arteriogram demonstrates the right external carotid artery and its major branches to be widely patent.  The right internal carotid artery at the bulb to the cranial skull base demonstrates wide patency with mild FMD-like changes involving the mid cervical right ICA.  The petrous, cavernous and the supraclinoid segments are widely patent.  A right posterior communicating artery is seen opacifying the right posterior cerebral distribution.  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 The right anterior cerebral artery opacifies into the capillary and venous phases.  PROCEDURE: The diagnostic JB 1 catheter in right common carotid artery was exchanged over an 8 [place] 300 cm Rosen exchange guidewire for an 087 balloon guide catheter which had been prepped with 50% contrast and 50% heparinized saline infusion. This was advanced to the right common carotid artery just proximal to the origin of the right internal carotid artery. The guidewire was removed. Good aspiration was obtained from  the hub of the balloon guide catheter.  Gentle contrast injection demonstrated no evidence of spasms or of intraluminal filling defects. Over a 0.014 inch standard Synchro micro guidewire with a J configuration, the combination of the 021 Trevo ProVue microcatheter inside of a 6 [place] Catalyst 132 cm guide catheter was advanced to the distal end of the balloon guide catheter.  With the micro guidewire leading with a J-tip configuration to avoid dissections or inducing spasm, the combination was navigated without difficulty to the supraclinoid right ICA.  The occluded right middle cerebral artery was entered with a micro guidewire which was advanced to the M3 regions of the inferior division of the right middle cerebral artery followed by the microcatheter. The guidewire was removed. Good aspiration was obtained from the hub of the microcatheter. Gentle contrast injection demonstrated safe position of tip of the microcatheter. This was then connected to continuous heparinized saline infusion.  A 4 mm x 40 mm Solitaire X retrieval device was then advanced to the distal end of the microcatheter. The O ring on the delivery microcatheter was then loosened. With slight forward gentle traction with the right hand on the delivery micro guidewire with left hand the delivery microcatheter was retrieved unsheathing the retrieval device.  The Catalyst 6 [place] guide catheter was advanced to the occluded right middle cerebral artery. With proximal flow arrest obtained by inflating the balloon in the right internal carotid artery, and after constant aspiration using a Penumbra aspiration device at the hub of the 6 [place] Catalyst guide catheter, and with a 60 mL syringe at the hub of the balloon guide catheter, the combination of the retrieval device, the microcatheter, and the 6 [place] Catalyst guide were retrieved and removed as constant aspiration was applied at the hub of the balloon guide catheter.  Proximal flow  arrest  was reversed. A control arteriogram performed through the balloon guide catheter which had been advanced into the mid right internal carotid artery demonstrated complete opacification of the right middle cerebral artery and the inferior division. However, the superior division remained occluded.  A second pass was then made again using the above combination. With the micro guidewire leading, access into the M3 region of the superior division was obtained with a microcatheter. The micro guidewire was removed. Good aspiration was established at the hub of the microcatheter. A gentle control arteriogram again demonstrates safe positioning of the tip of the microcatheter which was then connected to continuous heparinized saline infusion. Again with constant flow arrest in the right internal carotid artery, and aspiration at the hub of the Catalyst guide catheter using the Penumbra aspiration device at the origin of the superior division for 2 minutes, and with a 60 mL syringe at the hub of the balloon guide catheter, the combination of the retrieval device, the microcatheter and the 6 [place] Catalyst guide catheter were retrieved and removed. Following reversal of flow arrest, control arteriogram performed through the balloon guide catheter in the right internal carotid artery now demonstrated revascularization of the superior division and also further improved caliber of the inferior division.  There continued to be a very small branch extending into the anterior temporal region which was occluded in the M2 region. Also noted was a distal M3 M4 junction branch occlusion of a parietal branch of the inferior division.  A TICI 2C revascularization had been achieved.  The right anterior cerebral artery remained widely patent with cross-filling via the anterior communicating artery of the left anterior cerebral A2 segment and distally.  The posterior communicating artery remain widely patent.  Moderate spasm in  the distal cervical right ICA, and the superior division of the right middle cerebral artery responded to 2 aliquots of 25 mcg of nitroglycerin intra-arterially.  A final control arteriogram performed through the balloon guide catheter in the right common carotid artery demonstrated significantly improved caliber of the right internal carotid artery, with maintenance of a TICI 2c revascularization of the right middle cerebral artery distribution.  The balloon guide catheter was removed. The right groin 8 [place] Pinnacle sheath was then removed with manual compression in the right groin. Distal pulses remained Dopplerable in the dorsalis pedis, and the posterior tibial regions bilaterally unchanged.  A flat panel CT of the brain demonstrated heterogeneous hyper attenuation in the right temporal lobe region anteriorly probably representing contrast stain versus less likely hemorrhagic conversion of infarct tissue.  There was no mass-effect noted on the right lateral ventricle. Patient was then transferred to the ICU unit for post thrombectomy management. Patient was left intubated on account of his COVID status.  IMPRESSION: Endovascular near complete revascularization of the right middle cerebral artery territory with 2 passes with the 4 mm x 40 mm Solitaire X retrieval device and Penumbra aspiration achieving a TICI 2c revascularization.  PLAN: Follow-up as per referring MD.   Electronically Signed   By: Luanne Bras M.D.   On: 04/18/2019 10:42   CT CEREBRAL PERFUSION W CONTRAST  Result Date: 04/17/2019 CLINICAL DATA:  Stroke, follow-up. EXAM: CT ANGIOGRAPHY HEAD AND NECK CT PERFUSION BRAIN TECHNIQUE: Multidetector CT imaging of the head and neck was performed using the standard protocol during bolus administration of intravenous contrast. Multiplanar CT image reconstructions and MIPs were obtained to evaluate the vascular anatomy. Carotid stenosis measurements (when applicable) are obtained  utilizing NASCET criteria, using the distal  internal carotid diameter as the denominator. Multiphase CT imaging of the brain was performed following IV bolus contrast injection. Subsequent parametric perfusion maps were calculated using RAPID software. CONTRAST:  153m OMNIPAQUE IOHEXOL 350 MG/ML SOLN COMPARISON:  Noncontrast head CT performed earlier the same day. FINDINGS: CTA NECK FINDINGS Aortic arch: The origins of the innominate and left common carotid arteries are incompletely imaged. The visualized aortic arch is unremarkable. No significant innominate or proximal subclavian stenosis visualized. Right carotid system: CCA patent to the bifurcation without stenosis. Prominent predominantly calcified plaque within the distal common carotid artery and proximal ICA. There is likely greater than 70% stenosis at the origin of the ICA, although exact quantification of stenosis is difficult due to irregularity of calcified plaque. Distal to this the ICA is patent within the neck without stenosis. Left carotid system: CCA patent to the bifurcation without stenosis. Calcified plaque within the distal common carotid artery, carotid bifurcation and proximal ICA. Resultant 30-40% narrowing of the proximal ICA as compared to the more distal vessel. Distal to this the ICA is patent within the neck without stenosis. Vertebral arteries: Right vertebral artery dominant. 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 Calcified plaque at the origin of the medial V1 left vertebral artery branch with at least moderate ostial stenosis. More distally the bilateral vertebral arteries are patent within the neck without stenosis. Skeleton: No acute bony abnormality. Cervical spondylosis with C5-C6 and C6-C7 posterior disc osteophytes. Other neck: No neck mass or cervical adenopathy. Upper chest: Paraseptal emphysema within the imaged lung apices. Also within the imaged lung apices but there is interstitial prominence suspicious for  interstitial lung disease. There are more nodular opacities within the right upper lobe measuring up to 1.4 cm (series 7, image 56) (series 7, image 99). Review of the MIP images confirms the above findings CTA HEAD FINDINGS Anterior circulation: The intracranial internal carotid arteries are patent with scattered calcified plaque. No more than mild stenosis within these vessels. 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 There is minimal reconstitution of flow seen within M2 and more distal right MCA branch vessels. The left middle cerebral artery is patent without high-grade proximal stenosis. The bilateral anterior cerebral arteries are patent without significant proximal stenosis. Posterior circulation: The intracranial vertebral arteries are patent without significant stenosis, as is the basilar artery. The bilateral posterior cerebral arteries are patent without significant proximal stenosis. A sizable right posterior communicating artery is present. The a left posterior communicating artery is poorly delineated and may be hypoplastic or absent. Venous sinuses: Within limitations of contrast timing, no convincing thrombus. Anatomic variants: As described Review of the MIP images confirms the above findings CT Brain Perfusion Findings: ASPECTS: 4 CBF (<30%) Volume: 143m(in the right MCA vascular territory) Perfusion (Tmax>6.0s) volume: 147 mL (in the right MCA vascular territory) Mismatch Volume: [id number] Location:Right MCA vascular territory Findings of a proximal M1 right MCA occlusion and the perfusion results were discussed by telephone at the time of interpretation on 04/17/2019 at 10:38 am to provider Dr. AroLorraine Laxho verbally acknowledged these results. IMPRESSION: CTA neck: 1. The common and internal carotid arteries are patent within the neck bilaterally. 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 There is likely greater than 70% stenosis at the origin of the  right ICA. 30-40% stenosis of the proximal left ICA. 2. The vertebral arteries are patent within the neck bilaterally. The right vertebral artery is dominant with moderate atherosclerotic narrowing at its origin. Duplicated V1  left vertebral artery. There is at least moderate ostial stenosis at the origin of the medial left V1 vertebral artery branch. 3. The partially imaged lung apices demonstrate findings suspicious for interstitial lung disease. Additionally, there are nodular opacities within the right lung apex measuring up to 1.4 cm. Dedicated chest CT is recommended for further evaluation when clinically feasible. CTA head: 1. 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 There is minimal reconstitution of flow within M2 and more distal right MCA branch vessels. 2. No other intracranial large vessel occlusion or proximal high-grade arterial stenosis identified. CT perfusion head: The perfusion software identifies a 10 mL core infarct within the right MCA vascular territory. However, please note more extensive infarction changes were appreciated on concurrently performed noncontrast head CT. The perfusion software detects a 147 mL region of hypoperfused parenchyma within the right MCA vascular territory. Reported mismatch 137 mL. Electronically Signed   By: Kellie Simmering DO   On: 04/17/2019 11:04   DG Chest Port 1 View  Result Date: 04/21/2019 CLINICAL DATA:  Shortness of breath. EXAM: PORTABLE CHEST 1 VIEW COMPARISON:  04/18/2019 FINDINGS: The endotracheal tube has been removed. The patient is rotated to the right with grossly unchanged cardiomediastinal silhouette. Lung volumes are lower than on the prior study with increased elevation of the right hemidiaphragm. Mid and lower lung airspace opacities bilaterally have slightly increased in the right base and slightly improved in the left base. No sizable pleural effusion or pneumothorax is identified. IMPRESSION: Interval extubation. Lower lung  volumes with mildly worsened aeration in the right lung base and mildly improved aeration in the left lung base. Electronically Signed   By: Logan Bores M.D.   On: 04/21/2019 08:54   DG Chest Port 1 View  Result Date: 04/18/2019 CLINICAL DATA:  Acute respiratory failure EXAM: PORTABLE CHEST 1 VIEW COMPARISON:  Radiograph 04/17/2019 FINDINGS: *Endotracheal tube in the mid trachea, 5.5 cm from the carina. *Telemetry leads overlie the chest. Slight improvement in the bilateral airspace disease most pronounced in the left base and periphery and right mid lung. No new areas of airspace disease. No pneumothorax or visible effusion the portion of the costophrenic sulci on the left is collimated. Cardiomediastinal contours are stable with a calcified aorta. No acute osseous or soft tissue abnormality. IMPRESSION: Stable satisfactory positioning of the endotracheal tube. Slight interval improvement of bilateral airspace disease. Electronically Signed   By: Lovena Le M.D.   On: 04/18/2019 06:15   DG Chest Port 1 View  Result Date: 04/17/2019 CLINICAL DATA:  COVID-19. Intubation. EXAM: PORTABLE CHEST 1 VIEW COMPARISON:  Radiograph yesterday. FINDINGS: Endotracheal tube tip 5.8 cm from the carina. Probable esophageal temperature probe with tip at the level of the clavicular heads. No definite enteric tube is visualized. Patchy bilateral airspace disease within both lungs, slight worsening in the right midlung zone from prior. Unchanged heart size and mediastinal contours. No pneumothorax or large pleural effusion. No acute osseous abnormalities are seen. IMPRESSION: 1. Bilateral pneumonia, with slight worsening in the right mid lung since yesterday. 2. Endotracheal tube tip 5.8 cm from the carina. Probable esophageal temperature probe with tip at the clavicular heads. Electronically Signed   By: Keith Rake M.D.   On: 04/17/2019 16:45   DG Chest Portable 1 View  Result Date: 04/16/2019 CLINICAL DATA:  Cough  and fever. New weakness. EXAM: PORTABLE CHEST 1 VIEW COMPARISON:  08/11/2009 FINDINGS: Normal sized heart. Interval diffuse prominence of the interstitial markings and patchy  opacities in the mid and lower lung zones bilaterally. No visible pleural fluid. Diffuse osteo IMPRESSION: Interval probable bilateral pneumonia and pneumonitis. Electronically Signed   By: Claudie Revering M.D.   On: 04/16/2019 14:28   VAS [place] TRANSCRANIAL DOPPLER W BUBBLES  Result Date: 04/23/2019  Transcranial Doppler with Bubble Indications: Stroke. Performing Technologist: Maudry Mayhew MHA, RDMS, RVT, RDCS  Examination Guidelines: A complete evaluation includes B-mode imaging, spectral Doppler, color Doppler, and power Doppler as needed of all accessible portions of each vessel. Bilateral testing is considered an integral part of a complete examination. Limited examinations for reoccurring indications may be performed as noted.  Summary:  A vascular evaluation was performed. The right middle cerebral artery was studied. An IV was inserted into the patient's right Forearm. Verbal informed consent was obtained.  There is no evidence of HITS (high intensity transient signals) at rest. Valsalva maneuver was suboptimal. Therefore, there is no obvious evidence of significant PFO (patent foramen ovale). Negative TCD Bubble study *See table(s) above for TCD measurements and observations.  Diagnosing physician: Antony Contras MD Electronically signed by Antony Contras MD on 04/23/2019 at 2:14:53 PM.    Final    IR PERCUTANEOUS ART THROMBECTOMY/INFUSION INTRACRANIAL INC DIAG ANGIO  Result Date: 04/19/2019 INDICATION: New onset of left-sided weakness with right gaze deviation and dysarthria. 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 EXAM: 1. EMERGENT LARGE VESSEL OCCLUSION THROMBOLYSIS (anterior CIRCULATION) COMPARISON:  CT angiogram of the head and neck of April 17, 2019. MEDICATIONS: Ancef 2 g IV  antibiotic was administered within 1 hour of the procedure. ANESTHESIA/SEDATION: General anesthesia. CONTRAST:  Isovue 300 approximately 65 mL. FLUOROSCOPY TIME:  Fluoroscopy Time: 41 minutes 18 seconds (1531 mGy). COMPLICATIONS: None immediate. TECHNIQUE: Following a full explanation of the procedure along with the potential associated complications, an informed witnessed consent was obtained from the patient's wife and daughter. The risks of intracranial hemorrhage of 10%, worsening neurological deficit, ventilator dependency, death and inability to revascularize were all reviewed in detail with the patient's wife and daughter. The patient was then put under general anesthesia by the Department of Anesthesiology at [facility]. The right groin was prepped and draped in the usual sterile fashion. Thereafter using modified Seldinger technique, transfemoral access into the right common femoral artery was obtained without difficulty. Over a 0.035 inch guidewire an 8 [place] Pinnacle sheath was inserted. Through this, and also over a 0.035 inch guidewire a 5 [place] JB 1 catheter was advanced to the aortic arch region and selectively positioned in the innominate artery and the right common carotid artery. FINDINGS: The innominate artery arteriogram demonstrates the origin of the right subclavian artery and the right common carotid artery to be widely patent. The right common carotid arteriogram demonstrates the right external carotid artery and its major branches to be widely patent. The right internal carotid artery at the bulb to the cranial skull base demonstrates wide patency with mild FMD-like changes involving the mid cervical right ICA. The petrous, cavernous and the supraclinoid segments are widely patent. A right posterior communicating artery is seen opacifying the right posterior cerebral distribution. 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 The right anterior  cerebral artery opacifies into the capillary and venous phases. PROCEDURE: The diagnostic JB 1 catheter in right common carotid artery was exchanged over an 8 French 300 cm Rosen exchange guidewire for an 087 balloon guide catheter which had been prepped with 50% contrast and 50%  heparinized saline infusion. This was advanced to the right common carotid artery just proximal to the origin of the right internal carotid artery. The guidewire was removed. Good aspiration was obtained from the hub of the balloon guide catheter. Gentle contrast injection demonstrated no evidence of spasms or of intraluminal filling defects. Over a 0.014 inch standard Synchro micro guidewire with a J configuration, the combination of the 021 Trevo ProVue microcatheter inside of a 6 [place] Catalyst 132 cm guide catheter was advanced to the distal end of the balloon guide catheter. With the micro guidewire leading with a J-tip configuration to avoid dissections or inducing spasm, the combination was navigated without difficulty to the supraclinoid right ICA. The occluded right middle cerebral artery was entered with a micro guidewire which was advanced to the M3 regions of the inferior division of the right middle cerebral artery followed by the microcatheter. The guidewire was removed. Good aspiration was obtained from the hub of the microcatheter. Gentle contrast injection demonstrated safe position of tip of the microcatheter. This was then connected to continuous heparinized saline infusion. A 4 mm x 40 mm Solitaire X retrieval device was then advanced to the distal end of the microcatheter. The O ring on the delivery microcatheter was then loosened. With slight forward gentle traction with the right hand on the delivery micro guidewire with left hand the delivery microcatheter was retrieved unsheathing the retrieval device. The Catalyst 6 [place] guide catheter was advanced to the occluded right middle cerebral artery. With proximal flow  arrest obtained by inflating the balloon in the right internal carotid artery, and after constant aspiration using a Penumbra aspiration device at the hub of the 6 [place] Catalyst guide catheter, and with a 60 mL syringe at the hub of the balloon guide catheter, the combination of the retrieval device, the microcatheter, and the 6 [place] Catalyst guide were retrieved and removed as constant aspiration was applied at the hub of the balloon guide catheter. Proximal flow arrest was reversed. A control arteriogram performed through the balloon guide catheter which had been advanced into the mid right internal carotid artery demonstrated complete opacification of the right middle cerebral artery and the inferior division. However, the superior division remained occluded. A second pass was then made again using the above combination. With the micro guidewire leading, access into the M3 region of the superior division was obtained with a microcatheter. The micro guidewire was removed. Good aspiration was established at the hub of the microcatheter. A gentle control arteriogram again demonstrates safe positioning of the tip of the microcatheter which was then connected to continuous heparinized saline infusion. Again with constant flow arrest in the right internal carotid artery, and aspiration at the hub of the Catalyst guide catheter using the Penumbra aspiration device at the origin of the superior division for 2 minutes, and with a 60 mL syringe at the hub of the balloon guide catheter, the combination of the retrieval device, the microcatheter and the 6 [place] Catalyst guide catheter were retrieved and removed. Following reversal of flow arrest, control arteriogram performed through the balloon guide catheter in the right internal carotid artery now demonstrated revascularization of the superior division and also further improved caliber of the inferior division. There continued to be a very small branch extending into  the anterior temporal region which was occluded in the M2 region. Also noted was a distal M3 M4 junction branch occlusion of a parietal branch of the inferior division. A TICI 2C revascularization had been achieved.  The right anterior cerebral artery remained widely patent with cross-filling via the anterior communicating artery of the left anterior cerebral A2 segment and distally. The posterior communicating artery remain widely patent. Moderate spasm in the distal cervical right ICA, and the superior division of the right middle cerebral artery responded to 2 aliquots of 25 mcg of nitroglycerin intra-arterially. A final control arteriogram performed through the balloon guide catheter in the right common carotid artery demonstrated significantly improved caliber of the right internal carotid artery, with maintenance of a TICI 2c revascularization of the right middle cerebral artery distribution. The balloon guide catheter was removed. The right groin 8 French Pinnacle sheath was then removed with manual compression in the right groin. Distal pulses remained Dopplerable in the dorsalis pedis, and the posterior tibial regions bilaterally unchanged. A flat panel CT of the brain demonstrated heterogeneous hyper attenuation in the right temporal lobe region anteriorly probably representing contrast stain versus less likely hemorrhagic conversion of infarct tissue. There was no mass-effect noted on the right lateral ventricle. Patient was then transferred to the ICU unit for post thrombectomy management. Patient was left intubated on account of his COVID status. IMPRESSION: Endovascular near complete revascularization of the right middle cerebral artery territory with 2 passes with the 4 mm x 40 mm Solitaire X retrieval device and Penumbra aspiration achieving a TICI 2c revascularization. PLAN: Follow-up as per referring MD. Electronically Signed   By: Luanne Bras M.D.   On: 04/18/2019 10:42   CT HEAD CODE  STROKE WO CONTRAST  Result Date: 04/17/2019 CLINICAL DATA:  Code stroke. Left-sided facial droop/weakness, last known well noon yesterday EXAM: CT HEAD WITHOUT CONTRAST TECHNIQUE: Contiguous axial images were obtained from the base of the skull through the vertex without intravenous contrast. COMPARISON:  No pertinent prior studies available for comparison. 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 Subtle petechial hemorrhage is questioned within a region of infarction within the anterior right frontal lobe (series 2, image 19). No significant mass effect or midline shift. No extra-axial fluid collection. Background mild ill-defined hypoattenuation within the cerebral white matter is nonspecific, but consistent with chronic small vessel ischemic disease. Mild generalized parenchymal atrophy. Vascular: Abnormal hyperdensity of the M1 right MCA likely reflecting thrombus. Atherosclerotic calcifications. Skull: No calvarial fracture or suspicious osseous lesion. Sinuses/Orbits: Visualized orbits demonstrate no acute abnormality. Mild paranasal sinus mucosal thickening. No significant mastoid effusion. ASPECTS ([facility] Stroke Program Early CT Score) - Ganglionic level infarction (caudate, lentiform nuclei, internal capsule, insula, M1-M3 cortex): 4 (points subtracted for M2, M3 and insula) - Supraganglionic infarction (M4-M6 cortex): 0 Total score (0-10 with 10 being normal): 4 These results were communicated to Dr. Lorraine Lax At 10:38 amon 2/10/2021by text page via the [facility] messaging system. IMPRESSION: 1. 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 ASPECTS 4. Subtle petechial hemorrhage is questioned within an anterior right frontal lobe infarct site. No  significant mass effect. 2. Hyperdensity of the M1 right MCA likely reflecting thrombus. 3. Mild generalized parenchymal atrophy and chronic small vessel ischemic disease. Electronically Signed   By: Kellie Simmering DO   On: 04/17/2019 10:38   VAS [place] LOWER EXTREMITY VENOUS (DVT)  Result Date: 04/18/2019  Lower Venous DVTStudy Indications: Elevated Ddimer.  Risk Factors: COVID 19 positive. Limitations: Bandages, open wound and patient positioning. Comparison  Study: No prior studies. Performing Technologist: Oliver Hum RVT  Examination Guidelines: A complete evaluation includes B-mode imaging, spectral Doppler, color Doppler, and power Doppler as needed of all accessible portions of each vessel. Bilateral testing is considered an integral part of a complete examination. Limited examinations for reoccurring indications may be performed as noted. The reflux portion of the exam is performed with the patient in reverse Trendelenburg.  +---------+---------------+---------+-----------+----------+--------------+ RIGHT    CompressibilityPhasicitySpontaneityPropertiesThrombus Aging +---------+---------------+---------+-----------+----------+--------------+ CFV                                                   Not visualized +---------+---------------+---------+-----------+----------+--------------+ SFJ                                                   Not visualized +---------+---------------+---------+-----------+----------+--------------+ FV Prox  Full           Yes      Yes                                 +---------+---------------+---------+-----------+----------+--------------+ FV Mid   Full                                                        +---------+---------------+---------+-----------+----------+--------------+ FV DistalFull                                                        +---------+---------------+---------+-----------+----------+--------------+ PFV       Full                                                        +---------+---------------+---------+-----------+----------+--------------+ POP      Full           Yes      Yes                                 +---------+---------------+---------+-----------+----------+--------------+ PTV      Full                                                        +---------+---------------+---------+-----------+----------+--------------+ PERO     Full                                                        +---------+---------------+---------+-----------+----------+--------------+  Unable to visualize the CFV, SFJ due to bandages.  +---------+---------------+---------+-----------+----------+--------------+ LEFT     CompressibilityPhasicitySpontaneityPropertiesThrombus Aging +---------+---------------+---------+-----------+----------+--------------+ CFV      Full           Yes      Yes                                 +---------+---------------+---------+-----------+----------+--------------+ SFJ      Full                                                        +---------+---------------+---------+-----------+----------+--------------+ FV Prox  Full                                                        +---------+---------------+---------+-----------+----------+--------------+ FV Mid   Full                                                        +---------+---------------+---------+-----------+----------+--------------+ FV DistalFull                                                        +---------+---------------+---------+-----------+----------+--------------+ PFV      Full                                                        +---------+---------------+---------+-----------+----------+--------------+ POP      Full           Yes      Yes                                 +---------+---------------+---------+-----------+----------+--------------+ PTV       Full                                                        +---------+---------------+---------+-----------+----------+--------------+ PERO     Full                                                        +---------+---------------+---------+-----------+----------+--------------+ Gastroc  None  Acute          +---------+---------------+---------+-----------+----------+--------------+     Summary: RIGHT: - There is no evidence of deep vein thrombosis in the lower extremity.  - No cystic structure found in the popliteal fossa.  LEFT: - Findings consistent with acute deep vein thrombosis involving the left gastrocnemius veins. - No cystic structure found in the popliteal fossa.  *See table(s) above for measurements and observations. Electronically signed by Servando Snare MD on 04/18/2019 at 6:33:22 PM.    Final    ECHOCARDIOGRAM LIMITED  Result Date: 04/18/2019    ECHOCARDIOGRAM LIMITED REPORT   Patient Name:   BODEN STUCKY Date of Exam: 04/17/2019 Medical Rec #:  [id number]       Height:       68.0 in Accession #:    [id number]      Weight:       180.0 lb Date of Birth:  [birth year]       BSA:          1.95 m Patient Age:    41 years        BP:           141/51 mmHg Patient Gender: M               HR:           57 bpm. Exam Location:  Inpatient Procedure: Limited Echo and Cardiac Doppler Indications:    Stroke 434.91 / I163.9  History:        Patient has no prior history of Echocardiogram examinations.                 Signs/Symptoms:Chest Pain; Risk Factors:Hypertension. Pneumonia                 due to COVID-19 virus.  Sonographer:    Jaquita Folds Referring Phys: [place]  1. Left ventricular ejection fraction, by estimation, is 55 to 60%. The left ventricle has normal function. The left ventrical has no regional wall motion abnormalities. Left ventricular diastolic parameters are indeterminate.  2. Right ventricular systolic  function is normal. The right ventricular size is normal. There is normal pulmonary artery systolic pressure.  3. The mitral valve is normal in structure and function. trivial mitral valve regurgitation. No evidence of mitral stenosis.  4. The aortic valve is normal in structure and function. Aortic valve regurgitation is not visualized. No aortic stenosis is present. FINDINGS  Left Ventricle: Left ventricular ejection fraction, by estimation, is 55 to 60%. The left ventricle has normal function. The left ventricle has no regional wall motion abnormalities. There is no left ventricular hypertrophy. Right Ventricle: The right ventricular size is normal. No increase in right ventricular wall thickness. Right ventricular systolic function is normal. There is normal pulmonary artery systolic pressure. The tricuspid regurgitant velocity is 1.86 m/s, and  with an assumed right atrial pressure of 3 mmHg, the estimated right ventricular systolic pressure is 17.0 mmHg. Mitral Valve: The mitral valve is normal in structure and function. Trivial mitral valve regurgitation. No evidence of mitral valve stenosis. Tricuspid Valve: The tricuspid valve is grossly normal. Tricuspid valve regurgitation is mild. Aortic Valve: The aortic valve is normal in structure and function. Aortic valve regurgitation is not visualized. No aortic stenosis is present. Aorta: The aortic root, ascending aorta and aortic arch are all structurally normal, with no evidence of dilitation or obstruction.  LEFT VENTRICLE PLAX 2D LVIDd:  4.60 cm  Diastology LVIDs:         3.60 cm  LV e' lateral:   8.04 cm/s LV PW:         0.90 cm  LV E/e' lateral: 10.1 LV IVS:        0.90 cm  LV e' medial:    5.91 cm/s LVOT diam:     2.00 cm  LV E/e' medial:  13.8 LV SV:         55.61 ml LV SV Index:   21.50 LVOT Area:     3.14 cm  LEFT ATRIUM             Index LA diam:        4.30 cm 2.20 cm/m LA Vol (A2C):   50.7 ml 25.94 ml/m LA Vol (A4C):   43.8 ml 22.41 ml/m  LA Biplane Vol: 48.7 ml 24.92 ml/m  AORTIC VALVE LVOT Vmax:   79.40 cm/s LVOT Vmean:  57.900 cm/s LVOT VTI:    0.177 m  AORTA Ao Root diam: 2.70 cm MITRAL VALVE                        TRICUSPID VALVE MV Area (PHT): 2.50 cm             TR Peak grad:   13.8 mmHg MV Decel Time: 303 msec             TR Vmax:        186.00 cm/s MV E velocity: 81.60 cm/s 103 cm/s MV A velocity: 74.40 cm/s 70.3 cm/s SHUNTS MV E/A ratio:  1.10       1.5       Systemic VTI:  0.18 m                                     Systemic Diam: 2.00 cm Mertie Moores MD Electronically signed by Mertie Moores MD Signature Date/Time: 04/18/2019/5:07:36 PM    Final

## 2019-04-25 ENCOUNTER — Inpatient Hospital Stay (HOSPITAL_COMMUNITY): Payer: Medicare HMO

## 2019-04-25 DIAGNOSIS — E7849 Other hyperlipidemia: Secondary | ICD-10-CM | POA: Diagnosis not present

## 2019-04-25 DIAGNOSIS — I63511 Cerebral infarction due to unspecified occlusion or stenosis of right middle cerebral artery: Secondary | ICD-10-CM | POA: Diagnosis not present

## 2019-04-25 DIAGNOSIS — R2689 Other abnormalities of gait and mobility: Secondary | ICD-10-CM | POA: Diagnosis not present

## 2019-04-25 DIAGNOSIS — Z7401 Bed confinement status: Secondary | ICD-10-CM | POA: Diagnosis not present

## 2019-04-25 DIAGNOSIS — E119 Type 2 diabetes mellitus without complications: Secondary | ICD-10-CM | POA: Diagnosis not present

## 2019-04-25 DIAGNOSIS — R1319 Other dysphagia: Secondary | ICD-10-CM | POA: Diagnosis not present

## 2019-04-25 DIAGNOSIS — R404 Transient alteration of awareness: Secondary | ICD-10-CM | POA: Diagnosis not present

## 2019-04-25 DIAGNOSIS — J690 Pneumonitis due to inhalation of food and vomit: Secondary | ICD-10-CM | POA: Diagnosis not present

## 2019-04-25 DIAGNOSIS — I10 Essential (primary) hypertension: Secondary | ICD-10-CM | POA: Diagnosis not present

## 2019-04-25 DIAGNOSIS — M6281 Muscle weakness (generalized): Secondary | ICD-10-CM | POA: Diagnosis not present

## 2019-04-25 DIAGNOSIS — R278 Other lack of coordination: Secondary | ICD-10-CM | POA: Diagnosis not present

## 2019-04-25 DIAGNOSIS — R1312 Dysphagia, oropharyngeal phase: Secondary | ICD-10-CM | POA: Diagnosis not present

## 2019-04-25 DIAGNOSIS — I63231 Cerebral infarction due to unspecified occlusion or stenosis of right carotid arteries: Secondary | ICD-10-CM | POA: Diagnosis not present

## 2019-04-25 DIAGNOSIS — I82402 Acute embolism and thrombosis of unspecified deep veins of left lower extremity: Secondary | ICD-10-CM | POA: Diagnosis not present

## 2019-04-25 DIAGNOSIS — R41841 Cognitive communication deficit: Secondary | ICD-10-CM | POA: Diagnosis not present

## 2019-04-25 DIAGNOSIS — M2550 Pain in unspecified joint: Secondary | ICD-10-CM | POA: Diagnosis not present

## 2019-04-25 DIAGNOSIS — R2681 Unsteadiness on feet: Secondary | ICD-10-CM | POA: Diagnosis not present

## 2019-04-25 DIAGNOSIS — J9601 Acute respiratory failure with hypoxia: Secondary | ICD-10-CM | POA: Diagnosis not present

## 2019-04-25 DIAGNOSIS — R410 Disorientation, unspecified: Secondary | ICD-10-CM | POA: Diagnosis not present

## 2019-04-25 DIAGNOSIS — H1089 Other conjunctivitis: Secondary | ICD-10-CM | POA: Diagnosis not present

## 2019-04-25 DIAGNOSIS — U071 COVID-19: Secondary | ICD-10-CM | POA: Diagnosis not present

## 2019-04-25 DIAGNOSIS — I69912 Visuospatial deficit and spatial neglect following unspecified cerebrovascular disease: Secondary | ICD-10-CM | POA: Diagnosis not present

## 2019-04-25 DIAGNOSIS — J1282 Pneumonia due to coronavirus disease 2019: Secondary | ICD-10-CM | POA: Diagnosis not present

## 2019-04-25 LAB — COMPREHENSIVE METABOLIC PANEL
ALT: 102 U/L — ABNORMAL HIGH (ref 0–44)
AST: 67 U/L — ABNORMAL HIGH (ref 15–41)
Albumin: 2.4 g/dL — ABNORMAL LOW (ref 3.5–5.0)
Alkaline Phosphatase: 108 U/L (ref 38–126)
Anion gap: 9 (ref 5–15)
BUN: 26 mg/dL — ABNORMAL HIGH (ref 8–23)
CO2: 28 mmol/L (ref 22–32)
Calcium: 8.9 mg/dL (ref 8.9–10.3)
Chloride: 102 mmol/L (ref 98–111)
Creatinine, Ser: 1.04 mg/dL (ref 0.61–1.24)
GFR calc Af Amer: >60 mL/min (ref >60)
GFR calc non Af Amer: >60 mL/min (ref >60)
Glucose, Bld: 121 mg/dL — ABNORMAL HIGH (ref 70–99)
Potassium: 4.1 mmol/L (ref 3.5–5.1)
Sodium: 139 mmol/L (ref 135–145)
Total Bilirubin: 0.8 mg/dL (ref 0.3–1.2)
Total Protein: 6.5 g/dL (ref 6.5–8.1)

## 2019-04-25 LAB — CBC
HCT: 38.8 % — ABNORMAL LOW (ref 39.0–52.0)
Hemoglobin: 13 g/dL (ref 13.0–17.0)
MCH: 36.7 pg — ABNORMAL HIGH (ref 26.0–34.0)
MCHC: 33.5 g/dL (ref 30.0–36.0)
MCV: 109.6 fL — ABNORMAL HIGH (ref 80.0–100.0)
Platelets: 168 10*3/uL (ref 150–400)
RBC: 3.54 MIL/uL — ABNORMAL LOW (ref 4.22–5.81)
RDW: 14 % (ref 11.5–15.5)
WBC: 8 10*3/uL (ref 4.0–10.5)
nRBC: 0 % (ref 0.0–0.2)

## 2019-04-25 LAB — GLUCOSE, CAPILLARY
Glucose-Capillary: 105 mg/dL — ABNORMAL HIGH (ref 70–99)
Glucose-Capillary: 152 mg/dL — ABNORMAL HIGH (ref 70–99)

## 2019-04-25 LAB — MAGNESIUM: Magnesium: 2.1 mg/dL (ref 1.7–2.4)

## 2019-04-25 LAB — D-DIMER, QUANTITATIVE: D-Dimer, Quant: 3.11 ug/mL-FEU — ABNORMAL HIGH (ref 0.00–0.50)

## 2019-04-25 LAB — PROCALCITONIN: Procalcitonin: <0.1 ng/mL

## 2019-04-25 LAB — C-REACTIVE PROTEIN: CRP: 3.8 mg/dL — ABNORMAL HIGH (ref <1.0)

## 2019-04-25 LAB — TRIGLYCERIDES: Triglycerides: 108 mg/dL (ref <150)

## 2019-04-25 MED ORDER — DOXYCYCLINE HYCLATE 100 MG PO TABS: 100.0000 mg | ORAL_TABLET | Freq: Two times a day (BID) | ORAL | Status: AC

## 2019-04-25 MED ORDER — APIXABAN 5 MG PO TABS: 5.0000 mg | ORAL_TABLET | Freq: Two times a day (BID) | ORAL | Status: DC

## 2019-04-25 MED ORDER — ATORVASTATIN CALCIUM 40 MG PO TABS: 40.0000 mg | ORAL_TABLET | Freq: Every day | ORAL | Status: DC

## 2019-04-25 MED ORDER — DILTIAZEM HCL 60 MG PO TABS: 60.0000 mg | ORAL_TABLET | Freq: Two times a day (BID) | ORAL | Status: DC

## 2019-04-25 NOTE — Progress Notes (Signed)
Report called and given to Musician at [place]

## 2019-04-25 NOTE — TOC Transition Note (Addendum)
Transition of Care [facility]) - CM/SW Discharge Note   Patient Details  Name: Henry [name] MRN: [id number] Date of Birth: 06-12-1940  [facility]) CM/SW Contact:  Benard Halsted, LCSW Phone Number: 04/25/2019, 12:27 PM   Clinical Narrative:    Patient will DC to: [facility] Anticipated DC date: 04/25/19 Family notified:Daughter, Abigail Butts Transport by: Corey Harold   Per MD patient ready for DC to [employer]. RN, patient, patient's family, and facility notified of DC. Discharge Summary and FL2 sent to facility. RN to call report prior to discharge [phone] Room 703P). DC packet on chart. Ambulance transport requested for patient.   CSW will sign off for now as social work intervention is no longer needed. Please consult [place] again if new needs arise.  Cedric Fishman, LCSW Clinical Social Worker [phone]    Final next level of care: Skilled Nursing Facility Barriers to Discharge: No Barriers Identified   Patient Goals and CMS Choice Patient states their goals for this hospitalization and ongoing recovery are:: Get well enough to go home CMS Medicare.gov Compare Post Acute Care list provided to:: Patient Represenative (must comment)(Daughter, Abigail Butts) Choice offered to / list presented to : Adult Children  Discharge Placement   Existing PASRR number confirmed : 04/25/19          Patient [facility] bed at: [facility] Patient to be transferred to facility by: [facility] Name of family member notified: Daughter, Abigail Butts Patient and family notified of of transfer: 04/25/19  Discharge Plan and Services In-house Referral: Clinical Social Work Discharge Planning Services: [employer] Consult Post Acute Care Choice: [place]          DME Arranged: N/A DME Agency: NA                  Social Determinants of Health (SDOH) Interventions     Readmission Risk Interventions No flowsheet data found.

## 2019-04-25 NOTE — Progress Notes (Signed)
Modified Barium Swallow Progress Note  Patient Details  Name: Henry [name] MRN: [id number] Date of Birth: 1940-09-17  Today's Date: 04/25/2019  Modified Barium Swallow completed.  Full report located under Chart Review in the Imaging Section.  Brief recommendations include the following:  Clinical Impression  Pt demonstrated mild-moderate oropharyngeal dysphagia marked by prolonged transit with solids and aspiration of nectar thick barium. Barium entered his laryngeal vestibule prior to onset of laryngeal closure and was aspirated when swallow triggered followed by cough after 5 seconds. A second aspiration episode occured when epiglottis was late covering trachea and nectar thick was aspirated a second time without immediate cough. Smaller bolus size from cup and teaspoon was safe however cannot ensure full supervision/assist and pt unable to compensate independently. Improved coordination with honey thick on multiple trials. Mastication was extended with regular texture and significant cracker residual between lip and gum not sensed. He did however use tongue independently to remove food on left side at end of study. SLP modified liquids to honey consistency and continue puree, crush pills, full supervision, check left side for pocketed food. Continue ST at [facility].             Swallow Evaluation Recommendations       SLP Diet Recommendations: Dysphagia 1 (Puree) solids;Honey thick liquids   Liquid Administration via: Cup   Medication Administration: Crushed with puree   Supervision: Patient able to self feed;Staff to assist with self feeding;Full supervision/cueing for compensatory strategies   Compensations: Slow rate;Small sips/bites;Lingual sweep for clearance of pocketing   Postural Changes: Remain semi-upright after after feeds/meals (Comment);Seated upright at 90 degrees   Oral Care Recommendations: Oral care BID        Houston Siren 04/25/2019,3:04 PM    Orbie Pyo Colvin Caroli.Ed Risk analyst [phone] Office [phone]

## 2019-04-25 NOTE — TOC Progression Note (Signed)
Transition of Care [facility]) - Progression Note    Patient Details  Name: Henry [name] MRN: [id number] Date of Birth: 05/22/1940  Transition of Care [facility]) CM/SW [place], LCSW Phone Number: 04/25/2019, 9:45 AM  Clinical Narrative:    [name] able to accept patient today if medically stable.    Expected Discharge Plan: [place] Barriers to Discharge: Continued Medical Work up  Expected Discharge Plan and Services Expected Discharge Plan: [place] In-house Referral: Clinical Social Work Discharge Planning Services: CM Consult Post Acute Care Choice: [place] arrangements for the past 2 months: Single Family Home                 DME Arranged: N/A DME Agency: NA                   Social Determinants of Health (SDOH) Interventions    Readmission Risk Interventions No flowsheet data found.

## 2019-04-25 NOTE — Progress Notes (Addendum)
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  Was outside of window for TPA.  Went to interventional radiology for clot extraction, returned to ICU on ventilator critical care asked to assist with postop care.   OT comments  PTA, pt was Independent with ADLs and mobility. Pt progressing towards OT goals gradually. Pt received on RA seated in recliner. Pt motivated to participate in therapy. Guided pt in grooming tasks seated in recliner and sitting unsupported in chair. Pt Mod A for washing hair with shampoo cap with pt neglecting L side of head. Pt able to comb hair with Min A for thoroughness. Guided pt in oral care while sitting unsupported to improve core stability. However, pt needed Mod to Max A for sitting forward in chair to complete task. Pt Mod A for brushing teeth overall. Instructed pt in one-handed technique to open toothpaste tube w/ pt able to return demo. When leaning forward, pt's O2 stats dropped to 80s, placed 2 L O2 on and guided pt in pursed lip breathing, recovering quickly to 90s. Assessed pt ability to scan and locate ADL items to L side. With moderate verbal cues, pt able to locate 3/4 items. Tactile cues required to locate 4th item to far left with OT assistance to turn head. Will continue to follow acutely.   Follow Up Recommendations  SNF;Supervision/Assistance - 24 hour    Equipment Recommendations  Other (comment)(Defer to next venue, dependent on progress)    Recommendations for Other Services      Precautions / Restrictions Precautions Precautions: Fall;Other (comment)(Airborne) Restrictions Weight Bearing Restrictions: No       Mobility Bed Mobility               General bed mobility comments: Pt received in  recliner  Transfers Overall transfer level: Needs assistance Equipment used: 2 person hand held assist Transfers: Sit to/from Stand Sit to Stand: Max assist;+2 physical assistance;+2 safety/equipment         General transfer comment: Pt requires assistance to hold L hand onto [facility] when standing. Max A x 2 to stand. Pt able to stand for approximately 5 minutes while 1 therapist performed pericare. Mod A to maintain a standing position for majority of the 5 minutes. Towards the end when pt was fatigued he required max A due to left leaning and poor upright trunk positioning and control.    Balance Overall balance assessment: Needs assistance Sitting-balance support: Feet supported Sitting balance-Leahy Scale: Poor   Postural control: Left lateral lean Standing balance support: Bilateral upper extremity supported Standing balance-Leahy Scale: Poor                             ADL either performed or assessed with clinical judgement   ADL       Grooming: Moderate assistance;Oral care;Brushing hair;Minimal assistance;Sitting Grooming Details (indicate cue type and reason): Guided pt in washing hair with shampoo cap at Mod A due to pt neglecting to scrub L side of head. Pt Min A for brushing hair for thoroughness. Pt Mod A for oral care brushing teeth. Instructed in one-handed technique to open toothpaste with pt able to return demonstrate. Pt unable to sit unsupported during task with Mod/Max A to lean forward in recliner.  General ADL Comments: Pt requiring more assistance for trunk support today and difficulty safely scooting back in recliner. Pt Max A for these tasks     Vision       Perception     Praxis      Cognition Arousal/Alertness: Lethargic Behavior During Therapy: Flat affect Overall Cognitive Status: Impaired/Different from baseline Area of Impairment: Attention;Following  commands;Safety/judgement;Awareness;Problem solving                 Orientation Level: Disoriented to;Situation Current Attention Level: Focused;Sustained Memory: Decreased short-term memory Following Commands: Follows one step commands with increased time;Follows one step commands inconsistently Safety/Judgement: Decreased awareness of safety;Decreased awareness of deficits Awareness: Intellectual Problem Solving: Slow processing;Difficulty sequencing;Requires verbal cues;Requires tactile cues General Comments: left neglect, required multi-steps and cues to follow directions        Exercises Exercises: Other exercises General Exercises - Lower Extremity Heel Slides: (20-30 reps bilaterally. Requiring one-step commands and cues) Other Exercises Other Exercises: Pursed lip breathing Other Exercises: Scanning activity - focusing on locating ADL items with L gaze Other Exercises: Standing tolerance- 5 minutes with mod to max A.   Shoulder Instructions       General Comments Pt received on RA. During ADL tasks leaning forward, pt O2 stats dropped to low 80s. Placed 2 L O2 on pt with stats improving and remaining low 90s.     Pertinent Vitals/ Pain       Pain Assessment: No/denies pain  Home Living                                          Prior Functioning/Environment              Frequency  Min 3X/week        Progress Toward Goals  OT Goals(current goals can now be found in the care plan section)  Progress towards OT goals: Progressing toward goals  Acute Rehab OT Goals Patient Stated Goal: see his wife OT Goal Formulation: With patient Time For Goal Achievement: 05/02/19 Potential to Achieve Goals: Good ADL Goals Pt Will Perform Grooming: with min guard assist;sitting Pt Will Transfer to Toilet: with min assist;stand pivot transfer;bedside commode Additional ADL Goal #1: Pt will maintain sitting balance for ~10 minutes with Min Guard A  in preparation for ADLs Additional ADL Goal #2: Pt will sustain attention to simple ADLs with 2-3 cues Additional ADL Goal #3: Pt will locate 3/5 ADL items in left visual field with Mod cues  Plan Discharge plan remains appropriate    Co-evaluation        PT goals addressed during session: Strengthening/ROM;Mobility/safety with mobility OT goals addressed during session: ADL's and self-care      AM-PAC OT "6 Clicks" Daily Activity     Outcome Measure   Help from another person eating meals?: A Little Help from another person taking care of personal grooming?: A Little Help from another person toileting, which includes using toliet, bedpan, or urinal?: Total Help from another person bathing (including washing, rinsing, drying)?: A Lot Help from another person to put on and taking off regular upper body clothing?: A Lot Help from another person to put on and taking off regular lower body clothing?: Total 6 Click Score: 12    End of Session Equipment Utilized During Treatment: Oxygen  OT Visit Diagnosis: Unsteadiness on feet (R26.81);Other abnormalities of gait and mobility (  R26.89);Muscle weakness (generalized) (M62.81);Pain   Activity Tolerance Patient limited by fatigue   Patient Left in chair;with call bell/phone within reach;with chair alarm set   Nurse Communication Mobility status        Time: 1030-1059 OT Time Calculation (min): 29 min  Charges: OT General Charges $OT Visit: 1 Visit OT Treatments $Self Care/Home Management : 23-37 mins  Layla Maw, OTR/L   Layla Maw 04/25/2019, 2:45 PM

## 2019-04-25 NOTE — Discharge Summary (Addendum)
Henry [name] YDX:[id number] DOB: 1941/02/28 DOA: 04/17/2019  PCP: Cassandria Anger, MD  Admit date: 04/17/2019  Discharge date: 04/25/2019  Admitted From: Home   Disposition:  SNF   Recommendations for Outpatient Follow-up:   Follow up with PCP in 1-2 weeks  PCP Please obtain BMP/CBC, 2 view CXR in 1week,  (see Discharge instructions)   PCP Please follow up on the following pending results:    Home Health: None   Equipment/Devices: None  Consultations: Neuro Discharge Condition: Stable    CODE STATUS: Full    Diet Recommendation: Dysphagia 1 diet with Honey thick liquids, full feeding assistance and aspiration precautions.  Must have all meals in chair sitting up.  CC -shortness of breath  Brief history of present illness from the day of admission and additional interim summary    Patient is a 79 y.o. male with PMHx of HTN, polycythemia, chronic GERD, chronic low back pain who was admitted on 2/10 following a right MCA stroke-patient was taken by IR for revascularization procedure as patient was outside the TPA window-following which he was admitted to the ICU and required ventilator support.  Patient was also found to have COVID-19.  Upon clinical stability-and extubation-patient was transferred to the [facility] hospitalist service on 2/12.  Significant events:  2/10>> admitted with acute right MCA stroke-to IR for revascularization of Right MCA-return to ICU on a ventilator 2/11>> extubated 2/12>> transfer to [facility] Course    Acute Hypoxic Resp Failure due to Covid 19 Viral pneumonia: Remains stable-on just 1 L of oxygen-he was extubated on 2/12-   Sputum cultures positive on 2/10 for MSSA.  He is also status post extubation on  2/12.  Suspect that he may have superimposed aspiration pneumonia - was initially on Ancef but now has been transitioned to oral doxycycline with a stop date of 04/28/2019.  Finished remdesivir continue steroids.  Trend procalcitonin clinically improving.    Continue dysphagia 1 diet and honey thick liquids at SNF with aspiration precautions and feeding assistance as needed.  Must have all his meals sitting up in the chair.  Remains very high risk for repeat aspiration.  Long-term prognosis remains guarded but he is making small improvements every day.  Currently stable on room air and has made good respiratory progress.  Recent Labs  Lab 04/18/19 1642 04/18/19 1642 04/19/19 0215 04/19/19 0215 04/20/19 0826 04/20/19 0826 04/21/19 0345 04/22/19 1205 04/23/19 0416 04/24/19 0531 04/25/19 0528  CRP 10.0*   < > 9.7*   < > 18.9*   < > 16.4* 6.9* 4.5* 3.6* 3.8*  DDIMER  --   --  18.73*   < >  5.47*   < > 3.45* 3.30* 3.26* 3.61* 3.11*  FERRITIN 510*  --  485*  --  184  --  716*  --   --   --   --   PROCALCITON  --   --   --   --   --   --  0.17  --  0.12 <0.10 <0.10   < > = values in this interval not displayed.    Hepatic Function Latest Ref Rng & Units 04/25/2019 04/24/2019 04/23/2019  Total Protein 6.5 - 8.1 g/dL 6.5 7.4 5.6(L)  Albumin 3.5 - 5.0 g/dL 2.4(L) 2.3(L) 2.1(L)  AST 15 - 41 U/L 67(H) 105(H) 70(H)  ALT 0 - 44 U/L 102(H) 102(H) 64(H)  Alk Phosphatase 38 - 126 U/L 108 115 97  Total Bilirubin 0.3 - 1.2 mg/dL 0.8 0.9 0.8  Bilirubin, Direct 0.0 - 0.3 mg/dL - - -    Clinical dehydration.    Resolved after repeat hydration with IV fluids on 04/23/2019, continue to monitor closely at SNF, encourage staff to put him in the chair and offer him more food.  Continue PT OT.    Left lower extremity DVT: Likely provoked by COVID-19-on IV heparin >> Eliquis.  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  Patient is s/p  revascularization of right M1 occlusion by interventional radiology.  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  Echo with preserved EF and without any embolic foci.  LDL 53, A1c 6.8.  Has gross dysarthria, left facial droop, and significant weakness in the left upper extremity.  Stroke MD now switched to Eliquis and statin for dyslipidemia, continue PT OT, still has dense left upper extremity hemiparesis, also ordered 30-day event monitor post discharge per neuro recommendation.   A1c at goal for his age.  Continue PT OT and speech follow-up at SNF with outpatient PCP and neurology follow-up post discharge.  Dysphagia: Secondary to CVA-followed by SLP-on dysphagia 1 diet with honey thick liquids, feeding assistance aspiration precautions and speech follow-up to continue at SNF.  All meals in chair.  HTN: Stable on low-dose Cardizem.   Discharge diagnosis     Active Problems:   Stroke (cerebrum) ([place])   Middle cerebral artery embolism, right   Acute respiratory failure ([place])   COVID-19    Discharge instructions    Discharge Instructions    Ambulatory referral to Neurology   Complete by: As directed    Follow up with Dr. Leonie Man at [facility] in 4 weeks. Too complicated for NP to follow. Thanks.   Discharge instructions   Complete by: As directed    Follow with Primary MD [name], [name], MD in 7 days   Get CBC, CMP, 2 view Chest X ray -  checked next visit within 1 week by Primary MD    Activity: As tolerated with Full fall precautions use walker/cane & assistance as needed  Disposition SNF  Diet: Dysphagia 1 diet with Honey thick liquids with feeding assistance and aspiration precautions.  Special Instructions: If you have smoked or chewed Tobacco  in the last 2 yrs please stop smoking, stop any regular Alcohol  and or any Recreational drug use.  On your next visit with your primary care physician please Get Medicines reviewed and adjusted.  Please request your Prim.MD to go over  all Hospital Tests and Procedure/Radiological results at the follow up, please get all Hospital records sent to your Prim MD by  signing hospital release before you go home.  If you experience worsening of your admission symptoms, develop shortness of breath, life threatening emergency, suicidal or homicidal thoughts you must seek medical attention immediately by calling 911 or calling your MD immediately  if symptoms less severe.  You Must read complete instructions/literature along with all the possible adverse reactions/side effects for all the Medicines you take and that have been prescribed to you. Take any new Medicines after you have completely understood and accpet all the possible adverse reactions/side effects.   Increase activity slowly   Complete by: As directed       Discharge Medications   Allergies as of 04/25/2019      Reactions   No Known Allergies       Medication List    STOP taking these medications   naproxen sodium 220 MG tablet Commonly known as: ALEVE     TAKE these medications   ALIGN PO Take 1 capsule by mouth daily.   apixaban 5 MG Tabs tablet Commonly known as: ELIQUIS Take 1 tablet (5 mg total) by mouth 2 (two) times daily.   atorvastatin 40 MG tablet Commonly known as: LIPITOR Take 1 tablet (40 mg total) by mouth daily at 6 PM.   cholecalciferol 1000 units tablet Commonly known as: VITAMIN D Take 1,000 Units by mouth daily.   diltiazem 60 MG tablet Commonly known as: CARDIZEM Take 1 tablet (60 mg total) by mouth every 12 (twelve) hours.   doxycycline 100 MG tablet Commonly known as: VIBRA-TABS Take 1 tablet (100 mg total) by mouth every 12 (twelve) hours for 3 days. Stop date 04/28/2019.   Prostate Health Caps Take 1 capsule by mouth daily.   SUPER MEGA VITE 75/BETA CARO PO Take 1 tablet by mouth 2 (two) times daily.        Contact information for follow-up providers    Garvin Fila, MD. Schedule an appointment as soon as  possible for a visit in 4 week(s).   Specialties: Neurology, Radiology Contact information: [address] [place] [address] [phone]        [name], [name], MD. Schedule an appointment as soon as possible for a visit in 1 week(s).   Specialty: Internal Medicine Contact information: [place] [address] [phone]            Contact information for after-discharge care    Destination    [facility] .   Service: Skilled Nursing Contact information: [place] [phone]                  Major procedures and Radiology Reports - PLEASE review detailed and final reports thoroughly  -     ETT:2/10>>2/11  Revascularization of right proximal MCA 2/10  TTE -  1. Left ventricular ejection fraction, by estimation, is 55 to 60%. The left ventricle has normal function. The left ventrical has no regional wall motion abnormalities. Left ventricular diastolic parameters are  indeterminate.  2. Right ventricular systolic function is normal. The right ventricular size is normal. There is normal pulmonary artery systolic pressure.  3. The mitral valve is normal in structure and function. trivial mitral valve regurgitation. No evidence of mitral stenosis.  4. The aortic valve is normal in structure and function. Aortic valve regurgitation is not visualized. No aortic stenosis is present.    CT ANGIO HEAD W OR WO CONTRAST  Result Date: 04/17/2019 CLINICAL DATA:  Stroke, follow-up. EXAM: CT ANGIOGRAPHY HEAD AND NECK CT PERFUSION BRAIN TECHNIQUE: Multidetector CT imaging of the head and neck was performed using the standard protocol during bolus administration of intravenous contrast. Multiplanar CT image reconstructions and MIPs were obtained to evaluate the vascular anatomy. Carotid stenosis measurements (when applicable) are obtained utilizing NASCET criteria, using the  distal internal carotid diameter as the denominator. Multiphase CT imaging of the brain was performed following IV bolus contrast injection. Subsequent parametric perfusion maps were calculated using RAPID software. CONTRAST:  111m OMNIPAQUE IOHEXOL 350 MG/ML SOLN COMPARISON:  Noncontrast head CT performed earlier the same day. FINDINGS: CTA NECK FINDINGS Aortic arch: The origins of the innominate and left common carotid arteries are incompletely imaged. The visualized aortic arch is unremarkable. No significant innominate or proximal subclavian stenosis visualized. Right carotid system: CCA patent to the bifurcation without stenosis. Prominent predominantly calcified plaque within the distal common carotid artery and proximal ICA. There is likely greater than 70% stenosis at the origin of the ICA, although exact quantification of stenosis is difficult due to irregularity of calcified plaque. Distal to this the ICA is patent within the neck without stenosis. Left carotid system: CCA patent to the bifurcation without stenosis. Calcified plaque within the distal common carotid artery, carotid bifurcation and proximal ICA. Resultant 30-40% narrowing of the proximal ICA as compared to the more distal vessel. Distal to this the ICA is patent within the neck without stenosis. Vertebral arteries: Right vertebral artery dominant. 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 Calcified plaque at the origin of the medial V1 left vertebral artery branch with at least moderate ostial stenosis. More distally the bilateral vertebral arteries are patent within the neck without stenosis. Skeleton: No acute bony abnormality. Cervical spondylosis with C5-C6 and C6-C7 posterior disc osteophytes. Other neck: No neck mass or cervical adenopathy. Upper chest: Paraseptal emphysema within the imaged lung apices. Also within the imaged lung apices but there is interstitial prominence suspicious for interstitial lung disease. There are more  nodular opacities within the right upper lobe measuring up to 1.4 cm (series 7, image 56) (series 7, image 99). Review of the MIP images confirms the above findings CTA HEAD FINDINGS Anterior circulation: The intracranial internal carotid arteries are patent with scattered calcified plaque. No more than mild stenosis within these vessels. 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 There is minimal reconstitution of flow seen within M2 and more distal right MCA branch vessels. The left middle cerebral artery is patent without high-grade proximal stenosis. The bilateral anterior cerebral arteries are patent without significant proximal stenosis. Posterior circulation: The intracranial vertebral arteries are patent without significant stenosis, as is the basilar artery. The bilateral posterior cerebral arteries are patent without significant proximal stenosis. A sizable right posterior communicating artery is present. The a left posterior communicating artery is poorly delineated and may be hypoplastic or absent. Venous sinuses: Within limitations of contrast timing, no convincing thrombus. Anatomic variants: As described Review of the MIP images confirms the above findings CT Brain Perfusion Findings: ASPECTS: 4 CBF (<30%) Volume: 13m(in the right MCA vascular territory) Perfusion (Tmax>6.0s) volume: 147 mL (in the right MCA vascular territory) Mismatch Volume: [id number] Location:Right MCA vascular territory Findings of a proximal M1 right MCA occlusion and the perfusion results were discussed by telephone at the time of interpretation on 04/17/2019 at 10:38 am to provider Dr. AroLorraine Laxho verbally acknowledged these results. IMPRESSION: CTA neck: 1. The common and internal carotid arteries are patent  within the neck bilaterally. 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 There is likely greater than 70% stenosis at the origin of the right ICA. 30-40% stenosis of the proximal  left ICA. 2. The vertebral arteries are patent within the neck bilaterally. The right vertebral artery is dominant with moderate atherosclerotic narrowing at its origin. Duplicated V1 left vertebral artery. There is at least moderate ostial stenosis at the origin of the medial left V1 vertebral artery branch. 3. The partially imaged lung apices demonstrate findings suspicious for interstitial lung disease. Additionally, there are nodular opacities within the right lung apex measuring up to 1.4 cm. Dedicated chest CT is recommended for further evaluation when clinically feasible. CTA head: 1. 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 There is minimal reconstitution of flow within M2 and more distal right MCA branch vessels. 2. No other intracranial large vessel occlusion or proximal high-grade arterial stenosis identified. CT perfusion head: The perfusion software identifies a 10 mL core infarct within the right MCA vascular territory. However, please note more extensive infarction changes were appreciated on concurrently performed noncontrast head CT. The perfusion software detects a 147 mL region of hypoperfused parenchyma within the right MCA vascular territory. Reported mismatch 137 mL. Electronically Signed   By: Kellie Simmering DO   On: 04/17/2019 11:04   CT HEAD WO CONTRAST  Result Date: 04/22/2019 CLINICAL DATA:  Stroke, follow-up. EXAM: CT HEAD WITHOUT CONTRAST TECHNIQUE: Contiguous axial images were obtained from the base of the skull through the vertex without intravenous contrast. COMPARISON:  MRI/MRA head 04/19/2019, head CT 04/17/2019 FINDINGS: Brain: A large, now early subacute, infarct involving much of the right MCA vascular territory has not significantly changed in extent as compared to prior MRI 04/19/2019. There is involvement of the cortical and subcortical brain, right basal ganglia, thalamus as well as internal and external capsules. Redemonstrated petechial hemorrhage within  portions of the infarct territory. There is regional mass effect with partial effacement of the right lateral ventricle. No midline shift. No extra-axial fluid collection. No evidence of intracranial mass. Small infarcts within the left hemispheric white matter better appreciated on prior MRI. Vascular: No hyperdense vessel.  Atherosclerotic calcifications. Skull: Normal. Negative for fracture or focal lesion. Sinuses/Orbits: Visualized orbits demonstrate no acute abnormality. Mild bilateral maxillary sinus mucosal thickening. No significant mastoid effusion. IMPRESSION: A large, now early subacute, infarct involving much of the right MCA vascular territory has not significantly changed in extent. Redemonstrated associated petechial hemorrhage. Regional mass effect with partial effacement of the right lateral ventricle. No midline shift. Small infarcts within the left hemispheric white matter were better appreciated on prior MRI 04/19/2019. Electronically Signed   By: Kellie Simmering DO   On: 04/22/2019 07:36   CT HEAD WO CONTRAST  Result Date: 04/17/2019 CLINICAL DATA:  Follow-up intervention for right MCA occlusion. EXAM: CT HEAD WITHOUT CONTRAST TECHNIQUE: Contiguous axial images were obtained from the base of the skull through the vertex without intravenous contrast. COMPARISON:  Interventional images same day.  Head CT same day. FINDINGS: Brain: In the region the small core infarction in the lateral temporal lobe and the penumbra of the majority of the right middle cerebral artery cortical territory, there is brain swelling with effacement of the sulci and areas of patchy low-density as well as some areas of increased attenuation. Low-density present within the right basal ganglia. The appearance of the increased attenuation is more consistent with post procedural contrast staining than intraparenchymal hemorrhage. Hemorrhage is not  excluded but not favored. Certainly, there is no frank hematoma. No evidence  of midline shift. No hydrocephalus. No extra-axial collection. Vascular: No acute vascular finding primarily. Skull: Negative Sinuses/Orbits: Clear except for mild mucosal thickening. Other: None IMPRESSION: Ischemic changes in the right MCA territory affecting the cortical brain, subcortical brain and right basal ganglia. Areas of low-density as well as areas of indistinct higher density favored to represent contrast staining rather than frank hemorrhage. Low level petechial bleeding is not excluded. There is no discrete hematoma. No mass effect or shift, despite mild-to-moderate swelling in the region of affected brain. Electronically Signed   By: Nelson Chimes M.D.   On: 04/17/2019 20:35   CT ANGIO NECK W OR WO CONTRAST  Result Date: 04/17/2019 CLINICAL DATA:  Stroke, follow-up. EXAM: CT ANGIOGRAPHY HEAD AND NECK CT PERFUSION BRAIN TECHNIQUE: Multidetector CT imaging of the head and neck was performed using the standard protocol during bolus administration of intravenous contrast. Multiplanar CT image reconstructions and MIPs were obtained to evaluate the vascular anatomy. Carotid stenosis measurements (when applicable) are obtained utilizing NASCET criteria, using the distal internal carotid diameter as the denominator. Multiphase CT imaging of the brain was performed following IV bolus contrast injection. Subsequent parametric perfusion maps were calculated using RAPID software. CONTRAST:  179m OMNIPAQUE IOHEXOL 350 MG/ML SOLN COMPARISON:  Noncontrast head CT performed earlier the same day. FINDINGS: CTA NECK FINDINGS Aortic arch: The origins of the innominate and left common carotid arteries are incompletely imaged. The visualized aortic arch is unremarkable. No significant innominate or proximal subclavian stenosis visualized. Right carotid system: CCA patent to the bifurcation without stenosis. Prominent predominantly calcified plaque within the distal common carotid artery and proximal ICA. There is  likely greater than 70% stenosis at the origin of the ICA, although exact quantification of stenosis is difficult due to irregularity of calcified plaque. Distal to this the ICA is patent within the neck without stenosis. Left carotid system: CCA patent to the bifurcation without stenosis. Calcified plaque within the distal common carotid artery, carotid bifurcation and proximal ICA. Resultant 30-40% narrowing of the proximal ICA as compared to the more distal vessel. Distal to this the ICA is patent within the neck without stenosis. Vertebral arteries: Right vertebral artery dominant. 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 Calcified plaque at the origin of the medial V1 left vertebral artery branch with at least moderate ostial stenosis. More distally the bilateral vertebral arteries are patent within the neck without stenosis. Skeleton: No acute bony abnormality. Cervical spondylosis with C5-C6 and C6-C7 posterior disc osteophytes. Other neck: No neck mass or cervical adenopathy. Upper chest: Paraseptal emphysema within the imaged lung apices. Also within the imaged lung apices but there is interstitial prominence suspicious for interstitial lung disease. There are more nodular opacities within the right upper lobe measuring up to 1.4 cm (series 7, image 56) (series 7, image 99). Review of the MIP images confirms the above findings CTA HEAD FINDINGS Anterior circulation: The intracranial internal carotid arteries are patent with scattered calcified plaque. No more than mild stenosis within these vessels. 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 There is minimal reconstitution of flow seen within M2 and more distal right MCA branch vessels. The left middle cerebral artery is patent without high-grade proximal stenosis. The bilateral anterior cerebral arteries are patent without significant proximal stenosis. Posterior circulation: The intracranial vertebral arteries are patent without  significant stenosis, as is the basilar artery. The bilateral posterior cerebral arteries are patent  without significant proximal stenosis. A sizable right posterior communicating artery is present. The a left posterior communicating artery is poorly delineated and may be hypoplastic or absent. Venous sinuses: Within limitations of contrast timing, no convincing thrombus. Anatomic variants: As described Review of the MIP images confirms the above findings CT Brain Perfusion Findings: ASPECTS: 4 CBF (<30%) Volume: 19m (in the right MCA vascular territory) Perfusion (Tmax>6.0s) volume: 147 mL (in the right MCA vascular territory) Mismatch Volume: 134mInfarction Location:Right MCA vascular territory Findings of a proximal M1 right MCA occlusion and the perfusion results were discussed by telephone at the time of interpretation on 04/17/2019 at 10:38 am to provider Dr. ArLorraine Laxwho verbally acknowledged these results. IMPRESSION: CTA neck: 1. The common and internal carotid arteries are patent within the neck bilaterally. 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 There is likely greater than 70% stenosis at the origin of the right ICA. 30-40% stenosis of the proximal left ICA. 2. The vertebral arteries are patent within the neck bilaterally. The right vertebral artery is dominant with moderate atherosclerotic narrowing at its origin. Duplicated V1 left vertebral artery. There is at least moderate ostial stenosis at the origin of the medial left V1 vertebral artery branch. 3. The partially imaged lung apices demonstrate findings suspicious for interstitial lung disease. Additionally, there are nodular opacities within the right lung apex measuring up to 1.4 cm. Dedicated chest CT is recommended for further evaluation when clinically feasible. CTA head: 1. 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 There is minimal reconstitution of flow within M2 and more distal right MCA branch  vessels. 2. No other intracranial large vessel occlusion or proximal high-grade arterial stenosis identified. CT perfusion head: The perfusion software identifies a 10 mL core infarct within the right MCA vascular territory. However, please note more extensive infarction changes were appreciated on concurrently performed noncontrast head CT. The perfusion software detects a 147 mL region of hypoperfused parenchyma within the right MCA vascular territory. Reported mismatch 137 mL. Electronically Signed   By: KyKellie SimmeringO   On: 04/17/2019 11:04   MR ANGIO HEAD WO CONTRAST  Result Date: 04/19/2019 CLINICAL DATA:  Follow-up right MCA occlusion with intervention. EXAM: MRI HEAD WITHOUT CONTRAST MRA HEAD WITHOUT CONTRAST TECHNIQUE: Multiplanar, multiecho pulse sequences of the brain and surrounding structures were obtained without intravenous contrast. Angiographic images of the head were obtained using MRA technique without contrast. 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 Differences in diffusion appearance is attributed to artifact from petechial hemorrhage. There is progressive swelling with partial effacement of the right lateral ventricle. Midline shift only measures 2 mm. No entrapment. Confluent petechial hemorrhage is seen in the infarcted area. There are a few punctate acute infarcts in the left occipital parietal region, also seen prior. No extra-axial collection. Vascular: Arterial findings below. Skull and upper cervical spine: Negative Sinuses/Orbits: Negative MRA HEAD FINDINGS Motion degraded. Symmetric carotid and vertebral arteries. Dominant left PICA and right AICA. The vertebral, carotid, and basilar arteries are smooth and widely patent. No branch occlusion or aneurysm. Accounting for motion there is symmetric flow in the right MCA.  IMPRESSION: 1. 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 There is progressive swelling with up to 2 mm of midline shift. 2. Redemonstrated minimal acute infarct  in left hemispheric white matter. 3. Negative motion degraded MRA.  The right MCA is patent. Electronically Signed   By: Monte Fantasia M.D.   On: 04/19/2019 11:50   MR BRAIN WO CONTRAST  Result Date: 04/19/2019 CLINICAL DATA:  Follow-up right MCA occlusion with intervention. EXAM: MRI HEAD WITHOUT CONTRAST MRA HEAD WITHOUT CONTRAST TECHNIQUE: Multiplanar, multiecho pulse sequences of the brain and surrounding structures were obtained without intravenous contrast. Angiographic images of the head were obtained using MRA technique without contrast. 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 Differences in diffusion appearance is attributed to artifact from petechial hemorrhage. There is progressive swelling with partial effacement of the right lateral ventricle. Midline shift only measures 2 mm. No entrapment. Confluent petechial hemorrhage is seen in the infarcted area. There are a few punctate acute infarcts in the left occipital parietal region, also seen prior. No extra-axial collection. Vascular: Arterial findings below. Skull and upper cervical spine: Negative Sinuses/Orbits: Negative MRA HEAD FINDINGS Motion degraded. Symmetric carotid and vertebral arteries. Dominant left PICA and right AICA. The vertebral, carotid, and basilar arteries are smooth and widely patent. No branch occlusion or aneurysm. Accounting for motion there is symmetric flow in the right MCA. IMPRESSION: 1. 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 There is progressive swelling with up to 2 mm of  midline shift. 2. Redemonstrated minimal acute infarct in left hemispheric white matter. 3. Negative motion degraded MRA.  The right MCA is patent. Electronically Signed   By: Monte Fantasia M.D.   On: 04/19/2019 11:50   MR BRAIN WO CONTRAST  Result Date: 04/17/2019 CLINICAL DATA:  Focal neuro deficit, greater than 6 hours, stroke suspected. EXAM: MRI HEAD WITHOUT CONTRAST TECHNIQUE: Multiplanar, multiecho pulse sequences of the brain and surrounding structures were obtained without intravenous contrast. COMPARISON:  Noncontrast CT head, CT angiogram head/neck and CT perfusion performed earlier the same day 04/17/2019 FINDINGS: Brain: A limited protocol brain MRI was performed at the ordering provider's request consisting of only axial diffusion-weighted imaging and axial T2 FLAIR imaging. There is extensive multifocal restricted diffusion within the right MCA vascular territory, consistent with acute ischemic changes and involving portions of the right frontal, parietal, temporal and occipital lobes as well as right insular cortex, posterior limb of right internal capsule, right lentiform nucleus, right caudate body and right corona radiata. Of note, this includes acute ischemic change within the right motor strip. The largest region of diffusion weighted signal abnormality within the right temporal lobe measures 7.8 x 3.0 cm in transaxial dimensions (series 4, image 23). There is T2/FLAIR hyperintensity which corresponds with some, but not all, of the acute ischemic change. For instance, there is little to no T2/FLAIR hyperintensity corresponding with the ischemic changes within the right corona radiata, basal ganglia and right insula at this time. Punctate acute cortical infarct within the left parietooccipital lobe (series 4, image 33). Additional probable punctate acute infarcts more superiorly within the left parietal lobe (series 4, image 35) and within the lateral left occipital lobe (series 4, image  24). Vascular: Poorly assessed on the acquired sequences. Skull and upper cervical spine: No evidence of focal marrow lesion on the acquired sequences. Sinuses/Orbits: Visualized orbits demonstrate no acute abnormality. Minimal ethmoid sinus mucosal thickening. No significant mastoid effusion. IMPRESSION: Extensive restricted diffusion consistent with  acute ischemia involving the right MCA vascular territory. There is T2/FLAIR hyperintensity which corresponds with some, but not all, of the acute ischemic changes as described. Additional punctate acute infarcts within the left parietal and occipital lobes. Electronically Signed   By: Kellie Simmering DO   On: 04/17/2019 12:50   IR CT Head Ltd  Result Date: 04/17/2019 INDICATION: New onset of left-sided weakness with right gaze deviation and dysarthria.  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  EXAM: 1. EMERGENT LARGE VESSEL OCCLUSION THROMBOLYSIS (anterior CIRCULATION)  COMPARISON:  CT angiogram of the head and neck of April 17, 2019.  MEDICATIONS: Ancef 2 g IV antibiotic was administered within 1 hour of the procedure.  ANESTHESIA/SEDATION: General anesthesia.  CONTRAST:  Isovue 300 approximately 65 mL.  FLUOROSCOPY TIME:  Fluoroscopy Time: 41 minutes 18 seconds (1531 mGy).  COMPLICATIONS: None immediate.  TECHNIQUE: Following a full explanation of the procedure along with the potential associated complications, an informed witnessed consent was obtained from the patient's wife and daughter. The risks of intracranial hemorrhage of 10%, worsening neurological deficit, ventilator dependency, death and inability to revascularize were all reviewed in detail with the patient's wife and daughter.  The patient was then put under general anesthesia by the Department of Anesthesiology at [facility].  The right groin was prepped and draped in the usual sterile fashion. Thereafter using modified Seldinger  technique, transfemoral access into the right common femoral artery was obtained without difficulty. Over a 0.035 inch guidewire an 8 [place] Pinnacle sheath was inserted. Through this, and also over a 0.035 inch guidewire a 5 [place] JB 1 catheter was advanced to the aortic arch region and selectively positioned in the innominate artery and the right common carotid artery.  FINDINGS: The innominate artery arteriogram demonstrates the origin of the right subclavian artery and the right common carotid artery to be widely patent.  The right common carotid arteriogram demonstrates the right external carotid artery and its major branches to be widely patent.  The right internal carotid artery at the bulb to the cranial skull base demonstrates wide patency with mild FMD-like changes involving the mid cervical right ICA.  The petrous, cavernous and the supraclinoid segments are widely patent.  A right posterior communicating artery is seen opacifying the right posterior cerebral distribution.  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 The right anterior cerebral artery opacifies into the capillary and venous phases.  PROCEDURE: The diagnostic JB 1 catheter in right common carotid artery was exchanged over an 8 [place] 300 cm Rosen exchange guidewire for an 087 balloon guide catheter which had been prepped with 50% contrast and 50% heparinized saline infusion. This was advanced to the right common carotid artery just proximal to the origin of the right internal carotid artery. The guidewire was removed. Good aspiration was obtained from the hub of the balloon guide catheter.  Gentle contrast injection demonstrated no evidence of spasms or of intraluminal filling defects. Over a 0.014 inch standard Synchro micro guidewire with a J configuration, the combination of the 021 Trevo ProVue microcatheter inside of a 6 [place] Catalyst 132 cm guide catheter was advanced to the distal end of  the balloon guide catheter.  With the micro guidewire leading with a J-tip configuration to avoid dissections or inducing spasm, the combination was navigated without difficulty to the supraclinoid right ICA.  The occluded right middle cerebral artery was entered with a micro guidewire which was  advanced to the M3 regions of the inferior division of the right middle cerebral artery followed by the microcatheter. The guidewire was removed. Good aspiration was obtained from the hub of the microcatheter. Gentle contrast injection demonstrated safe position of tip of the microcatheter. This was then connected to continuous heparinized saline infusion.  A 4 mm x 40 mm Solitaire X retrieval device was then advanced to the distal end of the microcatheter. The O ring on the delivery microcatheter was then loosened. With slight forward gentle traction with the right hand on the delivery micro guidewire with left hand the delivery microcatheter was retrieved unsheathing the retrieval device.  The Catalyst 6 [place] guide catheter was advanced to the occluded right middle cerebral artery. With proximal flow arrest obtained by inflating the balloon in the right internal carotid artery, and after constant aspiration using a Penumbra aspiration device at the hub of the 6 [place] Catalyst guide catheter, and with a 60 mL syringe at the hub of the balloon guide catheter, the combination of the retrieval device, the microcatheter, and the 6 [place] Catalyst guide were retrieved and removed as constant aspiration was applied at the hub of the balloon guide catheter.  Proximal flow arrest was reversed. A control arteriogram performed through the balloon guide catheter which had been advanced into the mid right internal carotid artery demonstrated complete opacification of the right middle cerebral artery and the inferior division. However, the superior division remained occluded.  A second pass was then made again using the above  combination. With the micro guidewire leading, access into the M3 region of the superior division was obtained with a microcatheter. The micro guidewire was removed. Good aspiration was established at the hub of the microcatheter. A gentle control arteriogram again demonstrates safe positioning of the tip of the microcatheter which was then connected to continuous heparinized saline infusion. Again with constant flow arrest in the right internal carotid artery, and aspiration at the hub of the Catalyst guide catheter using the Penumbra aspiration device at the origin of the superior division for 2 minutes, and with a 60 mL syringe at the hub of the balloon guide catheter, the combination of the retrieval device, the microcatheter and the 6 [place] Catalyst guide catheter were retrieved and removed. Following reversal of flow arrest, control arteriogram performed through the balloon guide catheter in the right internal carotid artery now demonstrated revascularization of the superior division and also further improved caliber of the inferior division.  There continued to be a very small branch extending into the anterior temporal region which was occluded in the M2 region. Also noted was a distal M3 M4 junction branch occlusion of a parietal branch of the inferior division.  A TICI 2C revascularization had been achieved.  The right anterior cerebral artery remained widely patent with cross-filling via the anterior communicating artery of the left anterior cerebral A2 segment and distally.  The posterior communicating artery remain widely patent.  Moderate spasm in the distal cervical right ICA, and the superior division of the right middle cerebral artery responded to 2 aliquots of 25 mcg of nitroglycerin intra-arterially.  A final control arteriogram performed through the balloon guide catheter in the right common carotid artery demonstrated significantly improved caliber of the right internal carotid artery,  with maintenance of a TICI 2c revascularization of the right middle cerebral artery distribution.  The balloon guide catheter was removed. The right groin 8 [place] Pinnacle sheath was then removed with manual compression in the right groin.  Distal pulses remained Dopplerable in the dorsalis pedis, and the posterior tibial regions bilaterally unchanged.  A flat panel CT of the brain demonstrated heterogeneous hyper attenuation in the right temporal lobe region anteriorly probably representing contrast stain versus less likely hemorrhagic conversion of infarct tissue.  There was no mass-effect noted on the right lateral ventricle. Patient was then transferred to the ICU unit for post thrombectomy management. Patient was left intubated on account of his COVID status.  IMPRESSION: Endovascular near complete revascularization of the right middle cerebral artery territory with 2 passes with the 4 mm x 40 mm Solitaire X retrieval device and Penumbra aspiration achieving a TICI 2c revascularization.  PLAN: Follow-up as per referring MD.   Electronically Signed   By: Luanne Bras M.D.   On: 04/18/2019 10:42   CT CEREBRAL PERFUSION W CONTRAST  Result Date: 04/17/2019 CLINICAL DATA:  Stroke, follow-up. EXAM: CT ANGIOGRAPHY HEAD AND NECK CT PERFUSION BRAIN TECHNIQUE: Multidetector CT imaging of the head and neck was performed using the standard protocol during bolus administration of intravenous contrast. Multiplanar CT image reconstructions and MIPs were obtained to evaluate the vascular anatomy. Carotid stenosis measurements (when applicable) are obtained utilizing NASCET criteria, using the distal internal carotid diameter as the denominator. Multiphase CT imaging of the brain was performed following IV bolus contrast injection. Subsequent parametric perfusion maps were calculated using RAPID software. CONTRAST:  159m OMNIPAQUE IOHEXOL 350 MG/ML SOLN COMPARISON:  Noncontrast head CT performed earlier the  same day. FINDINGS: CTA NECK FINDINGS Aortic arch: The origins of the innominate and left common carotid arteries are incompletely imaged. The visualized aortic arch is unremarkable. No significant innominate or proximal subclavian stenosis visualized. Right carotid system: CCA patent to the bifurcation without stenosis. Prominent predominantly calcified plaque within the distal common carotid artery and proximal ICA. There is likely greater than 70% stenosis at the origin of the ICA, although exact quantification of stenosis is difficult due to irregularity of calcified plaque. Distal to this the ICA is patent within the neck without stenosis. Left carotid system: CCA patent to the bifurcation without stenosis. Calcified plaque within the distal common carotid artery, carotid bifurcation and proximal ICA. Resultant 30-40% narrowing of the proximal ICA as compared to the more distal vessel. Distal to this the ICA is patent within the neck without stenosis. Vertebral arteries: Right vertebral artery dominant. 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 Calcified plaque at the origin of the medial V1 left vertebral artery branch with at least moderate ostial stenosis. More distally the bilateral vertebral arteries are patent within the neck without stenosis. Skeleton: No acute bony abnormality. Cervical spondylosis with C5-C6 and C6-C7 posterior disc osteophytes. Other neck: No neck mass or cervical adenopathy. Upper chest: Paraseptal emphysema within the imaged lung apices. Also within the imaged lung apices but there is interstitial prominence suspicious for interstitial lung disease. There are more nodular opacities within the right upper lobe measuring up to 1.4 cm (series 7, image 56) (series 7, image 99). Review of the MIP images confirms the above findings CTA HEAD FINDINGS Anterior circulation: The intracranial internal carotid arteries are patent with scattered calcified plaque. No more than mild stenosis  within these vessels. 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 There is minimal reconstitution of flow seen within M2 and more distal right MCA branch vessels. The left middle cerebral artery is patent without high-grade proximal stenosis. The bilateral anterior cerebral arteries are patent without significant proximal stenosis. Posterior circulation: The intracranial  vertebral arteries are patent without significant stenosis, as is the basilar artery. The bilateral posterior cerebral arteries are patent without significant proximal stenosis. A sizable right posterior communicating artery is present. The a left posterior communicating artery is poorly delineated and may be hypoplastic or absent. Venous sinuses: Within limitations of contrast timing, no convincing thrombus. Anatomic variants: As described Review of the MIP images confirms the above findings CT Brain Perfusion Findings: ASPECTS: 4 CBF (<30%) Volume: 62m (in the right MCA vascular territory) Perfusion (Tmax>6.0s) volume: 147 mL (in the right MCA vascular territory) Mismatch Volume: 1355mInfarction Location:Right MCA vascular territory Findings of a proximal M1 right MCA occlusion and the perfusion results were discussed by telephone at the time of interpretation on 04/17/2019 at 10:38 am to provider Dr. ArLorraine Laxwho verbally acknowledged these results. IMPRESSION: CTA neck: 1. The common and internal carotid arteries are patent within the neck bilaterally. 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 There is likely greater than 70% stenosis at the origin of the right ICA. 30-40% stenosis of the proximal left ICA. 2. The vertebral arteries are patent within the neck bilaterally. The right vertebral artery is dominant with moderate atherosclerotic narrowing at its origin. Duplicated V1 left vertebral artery. There is at least moderate ostial stenosis at the origin of the medial left V1 vertebral artery branch. 3. The  partially imaged lung apices demonstrate findings suspicious for interstitial lung disease. Additionally, there are nodular opacities within the right lung apex measuring up to 1.4 cm. Dedicated chest CT is recommended for further evaluation when clinically feasible. CTA head: 1. 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 There is minimal reconstitution of flow within M2 and more distal right MCA branch vessels. 2. No other intracranial large vessel occlusion or proximal high-grade arterial stenosis identified. CT perfusion head: The perfusion software identifies a 10 mL core infarct within the right MCA vascular territory. However, please note more extensive infarction changes were appreciated on concurrently performed noncontrast head CT. The perfusion software detects a 147 mL region of hypoperfused parenchyma within the right MCA vascular territory. Reported mismatch 137 mL. Electronically Signed   By: KyKellie SimmeringO   On: 04/17/2019 11:04   DG Chest Port 1 View  Result Date: 04/24/2019 CLINICAL DATA:  Shortness of breath EXAM: PORTABLE CHEST 1 VIEW COMPARISON:  04/21/2019 FINDINGS: Cardiomegaly. Patchy bilateral airspace opacities. Slight improvement at the right base since prior study. No changes on the left. No visible effusions or acute bony abnormality. IMPRESSION: Patchy bilateral airspace disease, slightly improved at the right base since prior study. Electronically Signed   By: KeRolm Baptise.D.   On: 04/24/2019 11:18   DG Chest Port 1 View  Result Date: 04/21/2019 CLINICAL DATA:  Shortness of breath. EXAM: PORTABLE CHEST 1 VIEW COMPARISON:  04/18/2019 FINDINGS: The endotracheal tube has been removed. The patient is rotated to the right with grossly unchanged cardiomediastinal silhouette. Lung volumes are lower than on the prior study with increased elevation of the right hemidiaphragm. Mid and lower lung airspace opacities bilaterally have slightly increased in the right base and  slightly improved in the left base. No sizable pleural effusion or pneumothorax is identified. IMPRESSION: Interval extubation. Lower lung volumes with mildly worsened aeration in the right lung base and mildly improved aeration in the left lung base. Electronically Signed   By: AlLogan Bores.D.   On: 04/21/2019 08:54   DG Chest Port 1 View  Result Date: 04/18/2019 CLINICAL DATA:  Acute respiratory failure EXAM: PORTABLE CHEST 1 VIEW COMPARISON:  Radiograph 04/17/2019 FINDINGS: *Endotracheal tube in the mid trachea, 5.5 cm from the carina. *Telemetry leads overlie the chest. Slight improvement in the bilateral airspace disease most pronounced in the left base and periphery and right mid lung. No new areas of airspace disease. No pneumothorax or visible effusion the portion of the costophrenic sulci on the left is collimated. Cardiomediastinal contours are stable with a calcified aorta. No acute osseous or soft tissue abnormality. IMPRESSION: Stable satisfactory positioning of the endotracheal tube. Slight interval improvement of bilateral airspace disease. Electronically Signed   By: Lovena Le M.D.   On: 04/18/2019 06:15   DG Chest Port 1 View  Result Date: 04/17/2019 CLINICAL DATA:  COVID-19. Intubation. EXAM: PORTABLE CHEST 1 VIEW COMPARISON:  Radiograph yesterday. FINDINGS: Endotracheal tube tip 5.8 cm from the carina. Probable esophageal temperature probe with tip at the level of the clavicular heads. No definite enteric tube is visualized. Patchy bilateral airspace disease within both lungs, slight worsening in the right midlung zone from prior. Unchanged heart size and mediastinal contours. No pneumothorax or large pleural effusion. No acute osseous abnormalities are seen. IMPRESSION: 1. Bilateral pneumonia, with slight worsening in the right mid lung since yesterday. 2. Endotracheal tube tip 5.8 cm from the carina. Probable esophageal temperature probe with tip at the clavicular heads.  Electronically Signed   By: Keith Rake M.D.   On: 04/17/2019 16:45   DG Chest Portable 1 View  Result Date: 04/16/2019 CLINICAL DATA:  Cough and fever. New weakness. EXAM: PORTABLE CHEST 1 VIEW COMPARISON:  08/11/2009 FINDINGS: Normal sized heart. Interval diffuse prominence of the interstitial markings and patchy opacities in the mid and lower lung zones bilaterally. No visible pleural fluid. Diffuse osteo IMPRESSION: Interval probable bilateral pneumonia and pneumonitis. Electronically Signed   By: Claudie Revering M.D.   On: 04/16/2019 14:28   VAS [place] TRANSCRANIAL DOPPLER W BUBBLES  Result Date: 04/23/2019  Transcranial Doppler with Bubble Indications: Stroke. Performing Technologist: Maudry Mayhew MHA, RDMS, RVT, RDCS  Examination Guidelines: A complete evaluation includes B-mode imaging, spectral Doppler, color Doppler, and power Doppler as needed of all accessible portions of each vessel. Bilateral testing is considered an integral part of a complete examination. Limited examinations for reoccurring indications may be performed as noted.  Summary:  A vascular evaluation was performed. The right middle cerebral artery was studied. An IV was inserted into the patient's right Forearm. Verbal informed consent was obtained.  There is no evidence of HITS (high intensity transient signals) at rest. Valsalva maneuver was suboptimal. Therefore, there is no obvious evidence of significant PFO (patent foramen ovale). Negative TCD Bubble study *See table(s) above for TCD measurements and observations.  Diagnosing physician: Antony Contras MD Electronically signed by Antony Contras MD on 04/23/2019 at 2:14:53 PM.    Final    IR PERCUTANEOUS ART THROMBECTOMY/INFUSION INTRACRANIAL INC DIAG ANGIO  Result Date: 04/19/2019 INDICATION: New onset of left-sided weakness with right gaze deviation and dysarthria. 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 EXAM: 1. EMERGENT LARGE  VESSEL OCCLUSION THROMBOLYSIS (anterior CIRCULATION) COMPARISON:  CT angiogram of the head and neck of April 17, 2019. MEDICATIONS: Ancef 2 g IV antibiotic was administered within 1 hour of the procedure. ANESTHESIA/SEDATION: General anesthesia. CONTRAST:  Isovue 300 approximately 65 mL. FLUOROSCOPY TIME:  Fluoroscopy Time: 41 minutes 18 seconds (1531 mGy). COMPLICATIONS: None immediate. TECHNIQUE: Following a full explanation of the procedure along with the potential  associated complications, an informed witnessed consent was obtained from the patient's wife and daughter. The risks of intracranial hemorrhage of 10%, worsening neurological deficit, ventilator dependency, death and inability to revascularize were all reviewed in detail with the patient's wife and daughter. The patient was then put under general anesthesia by the Department of Anesthesiology at [facility]. The right groin was prepped and draped in the usual sterile fashion. Thereafter using modified Seldinger technique, transfemoral access into the right common femoral artery was obtained without difficulty. Over a 0.035 inch guidewire an 8 [place] Pinnacle sheath was inserted. Through this, and also over a 0.035 inch guidewire a 5 [place] JB 1 catheter was advanced to the aortic arch region and selectively positioned in the innominate artery and the right common carotid artery. FINDINGS: The innominate artery arteriogram demonstrates the origin of the right subclavian artery and the right common carotid artery to be widely patent. The right common carotid arteriogram demonstrates the right external carotid artery and its major branches to be widely patent. The right internal carotid artery at the bulb to the cranial skull base demonstrates wide patency with mild FMD-like changes involving the mid cervical right ICA. The petrous, cavernous and the supraclinoid segments are widely patent. A right posterior communicating artery is seen  opacifying the right posterior cerebral distribution. 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 The right anterior cerebral artery opacifies into the capillary and venous phases. PROCEDURE: The diagnostic JB 1 catheter in right common carotid artery was exchanged over an 8 [place] 300 cm Rosen exchange guidewire for an 087 balloon guide catheter which had been prepped with 50% contrast and 50% heparinized saline infusion. This was advanced to the right common carotid artery just proximal to the origin of the right internal carotid artery. The guidewire was removed. Good aspiration was obtained from the hub of the balloon guide catheter. Gentle contrast injection demonstrated no evidence of spasms or of intraluminal filling defects. Over a 0.014 inch standard Synchro micro guidewire with a J configuration, the combination of the 021 Trevo ProVue microcatheter inside of a 6 [place] Catalyst 132 cm guide catheter was advanced to the distal end of the balloon guide catheter. With the micro guidewire leading with a J-tip configuration to avoid dissections or inducing spasm, the combination was navigated without difficulty to the supraclinoid right ICA. The occluded right middle cerebral artery was entered with a micro guidewire which was advanced to the M3 regions of the inferior division of the right middle cerebral artery followed by the microcatheter. The guidewire was removed. Good aspiration was obtained from the hub of the microcatheter. Gentle contrast injection demonstrated safe position of tip of the microcatheter. This was then connected to continuous heparinized saline infusion. A 4 mm x 40 mm Solitaire X retrieval device was then advanced to the distal end of the microcatheter. The O ring on the delivery microcatheter was then loosened. With slight forward gentle traction with the right hand on the delivery micro guidewire with left hand the delivery microcatheter was  retrieved unsheathing the retrieval device. The Catalyst 6 [place] guide catheter was advanced to the occluded right middle cerebral artery. With proximal flow arrest obtained by inflating the balloon in the right internal carotid artery, and after constant aspiration using a Penumbra aspiration device at the hub of the 6 [place] Catalyst guide catheter, and with a 60 mL syringe at the hub of the balloon guide catheter, the combination of the retrieval  device, the microcatheter, and the 6 [place] Catalyst guide were retrieved and removed as constant aspiration was applied at the hub of the balloon guide catheter. Proximal flow arrest was reversed. A control arteriogram performed through the balloon guide catheter which had been advanced into the mid right internal carotid artery demonstrated complete opacification of the right middle cerebral artery and the inferior division. However, the superior division remained occluded. A second pass was then made again using the above combination. With the micro guidewire leading, access into the M3 region of the superior division was obtained with a microcatheter. The micro guidewire was removed. Good aspiration was established at the hub of the microcatheter. A gentle control arteriogram again demonstrates safe positioning of the tip of the microcatheter which was then connected to continuous heparinized saline infusion. Again with constant flow arrest in the right internal carotid artery, and aspiration at the hub of the Catalyst guide catheter using the Penumbra aspiration device at the origin of the superior division for 2 minutes, and with a 60 mL syringe at the hub of the balloon guide catheter, the combination of the retrieval device, the microcatheter and the 6 [place] Catalyst guide catheter were retrieved and removed. Following reversal of flow arrest, control arteriogram performed through the balloon guide catheter in the right internal carotid artery now demonstrated  revascularization of the superior division and also further improved caliber of the inferior division. There continued to be a very small branch extending into the anterior temporal region which was occluded in the M2 region. Also noted was a distal M3 M4 junction branch occlusion of a parietal branch of the inferior division. A TICI 2C revascularization had been achieved. The right anterior cerebral artery remained widely patent with cross-filling via the anterior communicating artery of the left anterior cerebral A2 segment and distally. The posterior communicating artery remain widely patent. Moderate spasm in the distal cervical right ICA, and the superior division of the right middle cerebral artery responded to 2 aliquots of 25 mcg of nitroglycerin intra-arterially. A final control arteriogram performed through the balloon guide catheter in the right common carotid artery demonstrated significantly improved caliber of the right internal carotid artery, with maintenance of a TICI 2c revascularization of the right middle cerebral artery distribution. The balloon guide catheter was removed. The right groin 8 [place] Pinnacle sheath was then removed with manual compression in the right groin. Distal pulses remained Dopplerable in the dorsalis pedis, and the posterior tibial regions bilaterally unchanged. A flat panel CT of the brain demonstrated heterogeneous hyper attenuation in the right temporal lobe region anteriorly probably representing contrast stain versus less likely hemorrhagic conversion of infarct tissue. There was no mass-effect noted on the right lateral ventricle. Patient was then transferred to the ICU unit for post thrombectomy management. Patient was left intubated on account of his COVID status. IMPRESSION: Endovascular near complete revascularization of the right middle cerebral artery territory with 2 passes with the 4 mm x 40 mm Solitaire X retrieval device and Penumbra aspiration achieving a  TICI 2c revascularization. PLAN: Follow-up as per referring MD. Electronically Signed   By: Luanne Bras M.D.   On: 04/18/2019 10:42   CT HEAD CODE STROKE WO CONTRAST  Result Date: 04/17/2019 CLINICAL DATA:  Code stroke. Left-sided facial droop/weakness, last known well noon yesterday EXAM: CT HEAD WITHOUT CONTRAST TECHNIQUE: Contiguous axial images were obtained from the base of the skull through the vertex without intravenous contrast. COMPARISON:  No pertinent prior studies available for comparison.  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 Subtle petechial hemorrhage is questioned within a region of infarction within the anterior right frontal lobe (series 2, image 19). No significant mass effect or midline shift. No extra-axial fluid collection. Background mild ill-defined hypoattenuation within the cerebral white matter is nonspecific, but consistent with chronic small vessel ischemic disease. Mild generalized parenchymal atrophy. Vascular: Abnormal hyperdensity of the M1 right MCA likely reflecting thrombus. Atherosclerotic calcifications. Skull: No calvarial fracture or suspicious osseous lesion. Sinuses/Orbits: Visualized orbits demonstrate no acute abnormality. Mild paranasal sinus mucosal thickening. No significant mastoid effusion. ASPECTS ([place] Stroke Program Early CT Score) - Ganglionic level infarction (caudate, lentiform nuclei, internal capsule, insula, M1-M3 cortex): 4 (points subtracted for M2, M3 and insula) - Supraganglionic infarction (M4-M6 cortex): 0 Total score (0-10 with 10 being normal): 4 These results were communicated to Dr. Lorraine Lax At 10:38 amon 2/10/2021by text page via the [facility] messaging system. IMPRESSION: 1. Multifocal changes of acute ischemic  infarction within the right MCA vascular territory. ASPECTS 4. Subtle petechial hemorrhage is questioned within an anterior right frontal lobe infarct site. No significant mass effect. 2. Hyperdensity of the M1 right MCA likely reflecting thrombus. 3. Mild generalized parenchymal atrophy and chronic small vessel ischemic disease. Electronically Signed   By: Kellie Simmering DO   On: 04/17/2019 10:38   VAS [place] LOWER EXTREMITY VENOUS (DVT)  Result Date: 04/18/2019  Lower Venous DVTStudy Indications: Elevated Ddimer.  Risk Factors: COVID 19 positive. Limitations: Bandages, open wound and patient positioning. Comparison Study: No prior studies. Performing Technologist: Oliver Hum RVT  Examination Guidelines: A complete evaluation includes B-mode imaging, spectral Doppler, color Doppler, and power Doppler as needed of all accessible portions of each vessel. Bilateral testing is considered an integral part of a complete examination. Limited examinations for reoccurring indications may be performed as noted. The reflux portion of the exam is performed with the patient in reverse Trendelenburg.  +---------+---------------+---------+-----------+----------+--------------+ RIGHT    CompressibilityPhasicitySpontaneityPropertiesThrombus Aging +---------+---------------+---------+-----------+----------+--------------+ CFV                                                   Not visualized +---------+---------------+---------+-----------+----------+--------------+ SFJ                                                   Not visualized +---------+---------------+---------+-----------+----------+--------------+ FV Prox  Full           Yes      Yes                                 +---------+---------------+---------+-----------+----------+--------------+ FV Mid   Full                                                        +---------+---------------+---------+-----------+----------+--------------+ FV  DistalFull                                                        +---------+---------------+---------+-----------+----------+--------------+  PFV      Full                                                        +---------+---------------+---------+-----------+----------+--------------+ POP      Full           Yes      Yes                                 +---------+---------------+---------+-----------+----------+--------------+ PTV      Full                                                        +---------+---------------+---------+-----------+----------+--------------+ PERO     Full                                                        +---------+---------------+---------+-----------+----------+--------------+ Unable to visualize the CFV, SFJ due to bandages.  +---------+---------------+---------+-----------+----------+--------------+ LEFT     CompressibilityPhasicitySpontaneityPropertiesThrombus Aging +---------+---------------+---------+-----------+----------+--------------+ CFV      Full           Yes      Yes                                 +---------+---------------+---------+-----------+----------+--------------+ SFJ      Full                                                        +---------+---------------+---------+-----------+----------+--------------+ FV Prox  Full                                                        +---------+---------------+---------+-----------+----------+--------------+ FV Mid   Full                                                        +---------+---------------+---------+-----------+----------+--------------+ FV DistalFull                                                        +---------+---------------+---------+-----------+----------+--------------+ PFV      Full                                                        +---------+---------------+---------+-----------+----------+--------------+  POP      Full           Yes      Yes                                 +---------+---------------+---------+-----------+----------+--------------+ PTV      Full                                                        +---------+---------------+---------+-----------+----------+--------------+ PERO     Full                                                        +---------+---------------+---------+-----------+----------+--------------+ Gastroc  None                                         Acute          +---------+---------------+---------+-----------+----------+--------------+     Summary: RIGHT: - There is no evidence of deep vein thrombosis in the lower extremity.  - No cystic structure found in the popliteal fossa.  LEFT: - Findings consistent with acute deep vein thrombosis involving the left gastrocnemius veins. - No cystic structure found in the popliteal fossa.  *See table(s) above for measurements and observations. Electronically signed by Servando Snare MD on 04/18/2019 at 6:33:22 PM.    Final    ECHOCARDIOGRAM LIMITED  Result Date: 04/18/2019    ECHOCARDIOGRAM LIMITED REPORT   Patient Name:   ZAKAI GONYEA Date of Exam: 04/17/2019 Medical Rec #:  [id number]       Height:       68.0 in Accession #:    [id number]      Weight:       180.0 lb Date of Birth:  1940-08-28       BSA:          1.95 m Patient Age:    74 years        BP:           141/51 mmHg Patient Gender: M               HR:           57 bpm. Exam Location:  Inpatient Procedure: Limited Echo and Cardiac Doppler Indications:    Stroke 434.91 / I163.9  History:        Patient has no prior history of Echocardiogram examinations.                 Signs/Symptoms:Chest Pain; Risk Factors:Hypertension. Pneumonia                 due to COVID-19 virus.  Sonographer:    Jaquita Folds Referring Phys: [place]  1. Left ventricular ejection fraction, by estimation, is 55 to 60%. The left ventricle has normal  function. The left ventrical has no regional wall motion abnormalities. Left ventricular diastolic parameters are indeterminate.  2. Right ventricular systolic function is normal. The right ventricular size is normal. There is  normal pulmonary artery systolic pressure.  3. The mitral valve is normal in structure and function. trivial mitral valve regurgitation. No evidence of mitral stenosis.  4. The aortic valve is normal in structure and function. Aortic valve regurgitation is not visualized. No aortic stenosis is present. FINDINGS  Left Ventricle: Left ventricular ejection fraction, by estimation, is 55 to 60%. The left ventricle has normal function. The left ventricle has no regional wall motion abnormalities. There is no left ventricular hypertrophy. Right Ventricle: The right ventricular size is normal. No increase in right ventricular wall thickness. Right ventricular systolic function is normal. There is normal pulmonary artery systolic pressure. The tricuspid regurgitant velocity is 1.86 m/s, and  with an assumed right atrial pressure of 3 mmHg, the estimated right ventricular systolic pressure is 76.2 mmHg. Mitral Valve: The mitral valve is normal in structure and function. Trivial mitral valve regurgitation. No evidence of mitral valve stenosis. Tricuspid Valve: The tricuspid valve is grossly normal. Tricuspid valve regurgitation is mild. Aortic Valve: The aortic valve is normal in structure and function. Aortic valve regurgitation is not visualized. No aortic stenosis is present. Aorta: The aortic root, ascending aorta and aortic arch are all structurally normal, with no evidence of dilitation or obstruction.  LEFT VENTRICLE PLAX 2D LVIDd:         4.60 cm  Diastology LVIDs:         3.60 cm  LV e' lateral:   8.04 cm/s LV PW:         0.90 cm  LV E/e' lateral: 10.1 LV IVS:        0.90 cm  LV e' medial:    5.91 cm/s LVOT diam:     2.00 cm  LV E/e' medial:  13.8 LV SV:         55.61 ml LV SV Index:   21.50  LVOT Area:     3.14 cm  LEFT ATRIUM             Index LA diam:        4.30 cm 2.20 cm/m LA Vol (A2C):   50.7 ml 25.94 ml/m LA Vol (A4C):   43.8 ml 22.41 ml/m LA Biplane Vol: 48.7 ml 24.92 ml/m  AORTIC VALVE LVOT Vmax:   79.40 cm/s LVOT Vmean:  57.900 cm/s LVOT VTI:    0.177 m  AORTA Ao Root diam: 2.70 cm MITRAL VALVE                        TRICUSPID VALVE MV Area (PHT): 2.50 cm             TR Peak grad:   13.8 mmHg MV Decel Time: 303 msec             TR Vmax:        186.00 cm/s MV E velocity: 81.60 cm/s 103 cm/s MV A velocity: 74.40 cm/s 70.3 cm/s SHUNTS MV E/A ratio:  1.10       1.5       Systemic VTI:  0.18 m                                     Systemic Diam: 2.00 cm Mertie Moores MD Electronically signed by Mertie Moores MD Signature Date/Time: 04/18/2019/5:07:36 PM    Final     Micro Results     Recent Results (from the past 240  hour(s))  Culture, blood (routine x 2)     Status: None   Collection Time: 04/16/19  1:45 PM   Specimen: BLOOD  Result Value Ref Range Status   Specimen Description BLOOD LEFT ANTECUBITAL  Final   Special Requests   Final    BOTTLES DRAWN AEROBIC AND ANAEROBIC Blood Culture results may not be optimal due to an inadequate volume of blood received in culture bottles   Culture   Final    NO GROWTH 5 DAYS Performed at [facility], [place] [address] [place], [place] [address]    Report Status 04/21/2019 FINAL  Final  Culture, blood (routine x 2)     Status: None   Collection Time: 04/16/19  2:49 PM   Specimen: BLOOD RIGHT HAND  Result Value Ref Range Status   Specimen Description BLOOD RIGHT HAND  Final   Special Requests   Final    BOTTLES DRAWN AEROBIC AND ANAEROBIC Blood Culture results may not be optimal due to an inadequate volume of blood received in culture bottles   Culture   Final    NO GROWTH 5 DAYS Performed at [facility], [place] [address], [place], [place] [address]    Report Status 04/21/2019 FINAL  Final  Urine culture     Status:  Abnormal   Collection Time: 04/16/19  4:45 PM   Specimen: Urine, Random  Result Value Ref Range Status   Specimen Description URINE, RANDOM  Final   Special Requests NONE  Final   Culture (A)  Final    <10,000 COLONIES/mL INSIGNIFICANT GROWTH Performed at [facility], [place] [address] [place], [place] [address]    Report Status 04/17/2019 FINAL  Final  Respiratory Panel by RT PCR (Flu A&B, Covid) -     Status: Abnormal   Collection Time: 04/16/19  5:30 PM  Result Value Ref Range Status   SARS Coronavirus 2 by RT PCR POSITIVE (A) NEGATIVE Final    Comment: RESULT CALLED TO, READ BACK BY AND VERIFIED WITH: Dois Davenport RN [id number] 04/16/19 A BROWNING (NOTE) SARS-CoV-2 target nucleic acids are DETECTED. SARS-CoV-2 RNA is generally detectable in upper respiratory specimens  during the acute phase of infection. Positive results are indicative of the presence of the identified virus, but do not rule out bacterial infection or co-infection with other pathogens not detected by the test. Clinical correlation with patient history and other diagnostic information is necessary to determine patient infection status. The expected result is Negative. Fact Sheet for Patients:  PinkCheek.be Fact Sheet for Healthcare Providers: GravelBags.it This test is not yet approved or cleared by the [place] FDA and  has been authorized for detection and/or diagnosis of SARS-CoV-2 by FDA under an Emergency Use Authorization (EUA).  This EUA will remain in effect (meaning this test can be used) fo r the duration of  the COVID-19 declaration under Section 564(b)(1) of the Act, 21 U.S.C. section 360bbb-3(b)(1), unless the authorization is terminated or revoked sooner.    Influenza A by PCR NEGATIVE NEGATIVE Final   Influenza B by PCR NEGATIVE NEGATIVE Final    Comment: (NOTE) The Xpert Xpress SARS-CoV-2/FLU/RSV assay is intended as an aid in  the  diagnosis of influenza from Nasopharyngeal swab specimens and  should not be used as a sole basis for treatment. Nasal washings and  aspirates are unacceptable for Xpert Xpress SARS-CoV-2/FLU/RSV  testing. Fact Sheet for Patients: PinkCheek.be Fact Sheet for Healthcare Providers: GravelBags.it This test is not yet  approved or cleared by the [place] and  has been authorized for detection and/or diagnosis of SARS-CoV-2 by  FDA under an Emergency Use Authorization (EUA). This EUA will remain  in effect (meaning this test can be used) for the duration of the  Covid-19 declaration under Section 564(b)(1) of the Act, 21  U.S.C. section 360bbb-3(b)(1), unless the authorization is  terminated or revoked. Performed at [facility], [place] [address] [place], [place] [address]   Culture, respiratory (non-expectorated)     Status: None   Collection Time: 04/17/19  5:05 PM   Specimen: Tracheal Aspirate; Respiratory  Result Value Ref Range Status   Specimen Description TRACHEAL ASPIRATE  Final   Special Requests   Final    NONE Performed at [facility], [place] [address] [place], [place] [address]    Gram Stain NO WBC SEEN NO ORGANISMS SEEN   Final   Culture RARE STAPHYLOCOCCUS AUREUS  Final   Report Status 04/20/2019 FINAL  Final   Organism ID, Bacteria STAPHYLOCOCCUS AUREUS  Final      Susceptibility   Staphylococcus aureus - MIC*    CIPROFLOXACIN <=0.5 SENSITIVE Sensitive     ERYTHROMYCIN <=0.25 SENSITIVE Sensitive     GENTAMICIN <=0.5 SENSITIVE Sensitive     OXACILLIN 0.5 SENSITIVE Sensitive     TETRACYCLINE <=1 SENSITIVE Sensitive     VANCOMYCIN <=0.5 SENSITIVE Sensitive     TRIMETH/SULFA <=10 SENSITIVE Sensitive     CLINDAMYCIN <=0.25 SENSITIVE Sensitive     RIFAMPIN <=0.5 SENSITIVE Sensitive     Inducible Clindamycin NEGATIVE Sensitive     * RARE STAPHYLOCOCCUS AUREUS  MRSA PCR Screening     Status:  None   Collection Time: 04/20/19  9:24 AM   Specimen: Nasal Mucosa; Nasopharyngeal  Result Value Ref Range Status   MRSA by PCR NEGATIVE NEGATIVE Final    Comment:        The GeneXpert MRSA Assay (FDA approved for NASAL specimens only), is one component of a comprehensive MRSA colonization surveillance program. It is not intended to diagnose MRSA infection nor to guide or monitor treatment for MRSA infections. Performed at [facility], [place] [address], [place], [place] [address]     Today   Subjective    Henry [name] today has no headache,no chest abdominal pain,no new weakness tingling or numbness, feels much better .   Objective   Blood pressure 102/71, pulse 76, temperature 98.2 F (36.8 C), temperature source Oral, resp. rate (!) 22, weight 80.1 kg, SpO2 96 %.   Intake/Output Summary (Last 24 hours) at 04/25/2019 1417 Last data filed at 04/25/2019 0900 Gross per 24 hour  Intake 580 ml  Output 850 ml  Net -270 ml    Exam Awake Alert, still has severe left-sided neglect and left arm weakness Sampson.AT,PERRAL Supple Neck,No JVD, No cervical lymphadenopathy appriciated.  Symmetrical Chest wall movement, Good air movement bilaterally, CTAB RRR,No Gallops,Rubs or new Murmurs, No Parasternal Heave +ve B.Sounds, Abd Soft, Non tender, No organomegaly appriciated, No rebound -guarding or rigidity. No Cyanosis, Clubbing or edema, No new Rash or bruise   Data Review   CBC w Diff:  Lab Results  Component Value Date   WBC 8.0 04/25/2019   HGB 13.0 04/25/2019   HCT 38.8 (L) 04/25/2019   PLT 168 04/25/2019   LYMPHOPCT 15 04/18/2019   MONOPCT 11 04/18/2019   EOSPCT 0 04/18/2019   BASOPCT 0 04/18/2019    CMP:  Lab Results  Component Value Date   NA 139 04/25/2019   K 4.1 04/25/2019   CL 102 04/25/2019   CO2 28 04/25/2019   BUN 26 (H) 04/25/2019   CREATININE 1.04 04/25/2019   PROT 6.5 04/25/2019   ALBUMIN 2.4 (L) 04/25/2019   BILITOT 0.8 04/25/2019    ALKPHOS 108 04/25/2019   AST 67 (H) 04/25/2019   ALT 102 (H) 04/25/2019  . Lab Results  Component Value Date   HGBA1C 6.8 (H) 04/17/2019   Lab Results  Component Value Date   CHOL 93 04/18/2019   HDL 19 (L) 04/18/2019   LDLCALC 53 04/18/2019   LDLDIRECT 135.0 02/24/2017   TRIG 108 04/25/2019   CHOLHDL 4.9 04/18/2019     Total Time in preparing paper work, data evaluation and todays exam - 19 minutes  Lala Lund M.D on 04/25/2019 at 2:17 PM  [facility]   Office  [phone]

## 2019-04-25 NOTE — Progress Notes (Signed)
[name] to be D/C'd [place] per MD order.  Discussed with the patient and all questions fully answered.  VSS, Skin clean, dry and intact without evidence of skin break down, no evidence of skin tears noted. IV catheter discontinued intact. Site without signs and symptoms of complications. Dressing and pressure applied.  An After Visit Summary was printed and given to PTAR. Report called and given to [facility].  Patient escorted via PTAR.  Jeanella Craze 04/25/2019 1:49 PM

## 2019-04-25 NOTE — Progress Notes (Signed)
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  Was outside of window for TPA.  Went to interventional radiology for clot extraction, returned to ICU on ventilator critical care asked to assist with postop care.    PT Comments    Pt is sitting in his recliner at start of therapy. He appears comfortable, without any distress noted. Resting SpO2 was 94% on 2L of O2 via nasal cannula. Pt performed bilateral LAQs sitting in recliner. He required moderate verbal cuing to sequence R knee flexion/ extension and maximal verbal cuing to perform L knee flexion/ extension. Pt is unable to reach full knee extension however comes within 30 degrees of neutral (0 degrees). Pt is max A x2 to stand from recliner using Stedy. Pt requires assistance to hold L hand onto Stedy due to decreased strength/ flaccidity. Pt is able to help stand using his R UE extension strength. Pt stands in [place] for 5 minutes with mod to max A x 1 for balance while other therpiast performed pericare. SpO2 remains within 90-96% throughout treatment while standing. Max A stand to sit transfer back into recliner. Pt is asked to help elevate L arm up onto pillow in which he was able to provide trace shoulder elevation and flexion. Pt left in recliner with call bell/ phone in reach and chair alarm activated.   Follow Up Recommendations  SNF;Supervision/Assistance - 24 hour            Recommendations for Other Services Rehab consult     Precautions / Restrictions Precautions Precautions: Fall;Other (comment)(Airborne) Restrictions Weight Bearing Restrictions: No    Mobility  Bed Mobility               General bed mobility comments: Pt received in recliner  Transfers Overall transfer level: Needs  assistance Equipment used: 2 person hand held assist Transfers: Sit to/from Stand Sit to Stand: Max assist;+2 physical assistance;+2 safety/equipment         General transfer comment: Pt requires assistance to hold L hand onto [facility] when standing. Max A x 2 to stand. Pt able to stand for approximately 5 minutes while 1 therapist performed pericare. Mod A to maintain a standing position for majority of the 5 minutes. Towards the end when pt was fatigued he required max A due to left leaning and poor upright trunk positioning and control.  Ambulation/Gait                 Stairs             Wheelchair Mobility    Modified Rankin (Stroke Patients Only)       Balance Overall balance assessment: Needs assistance Sitting-balance support: Feet supported Sitting balance-Leahy Scale: Poor   Postural control: Left lateral lean Standing balance support: Bilateral upper extremity supported Standing balance-Leahy Scale: Poor                              Cognition Arousal/Alertness: Lethargic Behavior During Therapy: Flat affect Overall Cognitive Status: Impaired/Different from baseline Area of Impairment: Attention;Following commands;Safety/judgement;Awareness;Problem solving                 Orientation Level: Disoriented to;Situation Current Attention Level: Focused;Sustained Memory: Decreased short-term memory Following Commands: Follows one step commands with increased  time;Follows one step commands inconsistently Safety/Judgement: Decreased awareness of safety;Decreased awareness of deficits Awareness: Intellectual Problem Solving: Slow processing;Difficulty sequencing;Requires verbal cues;Requires tactile cues General Comments: left neglect, required multi-steps and cues to follow directions      Exercises General Exercises - Lower Extremity Heel Slides: AROM;Strengthening;Both;Other reps (comment);Limitations(20-30 reps bilaterally. Requiring  one-step commands and cues) Other Exercises Other Exercises: Pursed lip breathing Other Exercises: Sit<>stand using Stedy.  Other Exercises: Standing tolerance- 5 minutes with mod to max A.    General Comments General comments (skin integrity, edema, etc.): Pt was receiving 2L of O2 throughout treatment. Pt was able to maintain SpO2 within 90-96% O2 throughout treatment. Pt stood using Stedy and max A x 2 for 5 minutes. Requires cues for upright trunk and positioning due to L>R sided weakness and overall decreased endurance.      Pertinent Vitals/Pain Pain Assessment: Faces Faces Pain Scale: Hurts a little bit           PT Goals (current goals can now be found in the care plan section) Acute Rehab PT Goals Patient Stated Goal: see his wife PT Goal Formulation: With patient Time For Goal Achievement: 05/02/19 Potential to Achieve Goals: Good Progress towards PT goals: Progressing toward goals    Frequency    Min 4X/week      PT Plan Current plan remains appropriate    Co-evaluation     PT goals addressed during session: Strengthening/ROM;Mobility/safety with mobility        AM-PAC PT "6 Clicks" Mobility   Outcome Measure  Help needed turning from your back to your side while in a flat bed without using bedrails?: A Lot Help needed moving from lying on your back to sitting on the side of a flat bed without using bedrails?: Total Help needed moving to and from a bed to a chair (including a wheelchair)?: Total Help needed standing up from a chair using your arms (e.g., wheelchair or bedside chair)?: Total Help needed to walk in hospital room?: Total Help needed climbing 3-5 steps with a railing? : Total 6 Click Score: 7    End of Session Equipment Utilized During Treatment: Gait belt Activity Tolerance: Patient limited by fatigue;Patient limited by lethargy Patient left: in chair;with call bell/phone within reach;with chair alarm set   PT Visit Diagnosis:  Unsteadiness on feet (R26.81);Other abnormalities of gait and mobility (R26.89);Muscle weakness (generalized) (M62.81);Difficulty in walking, not elsewhere classified (R26.2);Other symptoms and signs involving the nervous system (R29.898)     Time: [id number] PT Time Calculation (min) (ACUTE ONLY): 19 min  Charges:  $Therapeutic Activity: 8-22 mins                    Jodelle Green, PT, DPT Acute Rehabilitation Services Office [phone]    Jodelle Green 04/25/2019, 2:20 PM

## 2019-04-25 NOTE — Care Management Important Message (Signed)
Important Message  Patient Details  Name: AVONDRE FLANARY MRN: [id number] Date of Birth: 1940-05-06   Medicare Important Message Given:  Yes - Important Message mailed due to current National Emergency  Verbal consent obtained due to current National Emergency  Relationship to patient: Child Contact Name: Cala Bradford Call Date: 04/25/19  Time: 1057 Phone: [phone] Outcome: Spoke with contact Important Message mailed to: Emergency contact on file    Delorse Lek 04/25/2019, 10:58 AM

## 2019-04-26 ENCOUNTER — Telehealth: Payer: Self-pay

## 2019-04-26 DIAGNOSIS — I63511 Cerebral infarction due to unspecified occlusion or stenosis of right middle cerebral artery: Secondary | ICD-10-CM | POA: Diagnosis not present

## 2019-04-26 DIAGNOSIS — J1282 Pneumonia due to coronavirus disease 2019: Secondary | ICD-10-CM | POA: Diagnosis not present

## 2019-04-26 DIAGNOSIS — J9601 Acute respiratory failure with hypoxia: Secondary | ICD-10-CM | POA: Diagnosis not present

## 2019-04-26 DIAGNOSIS — U071 COVID-19: Secondary | ICD-10-CM | POA: Diagnosis not present

## 2019-04-26 NOTE — Telephone Encounter (Signed)
Pt admitted 04/17/2019 after stroke and dc'ed on 04/25/2019 to SNF. Disposition is to follow up with PCP in 1-2 weeks.   LVM for pt to call back as soon as possible. [name]

## 2019-05-03 DIAGNOSIS — U071 COVID-19: Secondary | ICD-10-CM | POA: Diagnosis not present

## 2019-05-03 DIAGNOSIS — I82402 Acute embolism and thrombosis of unspecified deep veins of left lower extremity: Secondary | ICD-10-CM | POA: Diagnosis not present

## 2019-05-03 DIAGNOSIS — J1282 Pneumonia due to coronavirus disease 2019: Secondary | ICD-10-CM | POA: Diagnosis not present

## 2019-05-03 DIAGNOSIS — I63511 Cerebral infarction due to unspecified occlusion or stenosis of right middle cerebral artery: Secondary | ICD-10-CM | POA: Diagnosis not present

## 2019-05-07 DIAGNOSIS — H1089 Other conjunctivitis: Secondary | ICD-10-CM | POA: Diagnosis not present

## 2019-05-07 DIAGNOSIS — E7849 Other hyperlipidemia: Secondary | ICD-10-CM | POA: Diagnosis not present

## 2019-05-07 DIAGNOSIS — I63231 Cerebral infarction due to unspecified occlusion or stenosis of right carotid arteries: Secondary | ICD-10-CM | POA: Diagnosis not present

## 2019-05-07 DIAGNOSIS — I10 Essential (primary) hypertension: Secondary | ICD-10-CM | POA: Diagnosis not present

## 2019-05-07 DIAGNOSIS — E119 Type 2 diabetes mellitus without complications: Secondary | ICD-10-CM | POA: Diagnosis not present

## 2019-05-07 DIAGNOSIS — I63511 Cerebral infarction due to unspecified occlusion or stenosis of right middle cerebral artery: Secondary | ICD-10-CM | POA: Diagnosis not present

## 2019-05-07 DIAGNOSIS — J690 Pneumonitis due to inhalation of food and vomit: Secondary | ICD-10-CM | POA: Diagnosis not present

## 2019-05-07 DIAGNOSIS — I82402 Acute embolism and thrombosis of unspecified deep veins of left lower extremity: Secondary | ICD-10-CM | POA: Diagnosis not present

## 2019-05-07 DIAGNOSIS — R1319 Other dysphagia: Secondary | ICD-10-CM | POA: Diagnosis not present

## 2019-05-09 ENCOUNTER — Other Ambulatory Visit: Payer: Self-pay | Admitting: *Deleted

## 2019-05-09 DIAGNOSIS — H5340 Unspecified visual field defects: Secondary | ICD-10-CM | POA: Diagnosis not present

## 2019-05-09 NOTE — Patient Outreach (Signed)
[facility]) Care Management  05/09/2019  Henry [name] [birth year] [id number]   Telephone screening call   Referral received : 05/09/19 Referral source : inpatient discharge notification Insurance: [employer]   Patient with inpatient admission at [facility] 2/10-2/18/21  [id number] Covid 19 Discharged to [facility], with discharge home on 05/07/19.   Subjective: Initial outreach call to patient , no answer unable to leave a message as mailbox is full.   Plan Will send unsuccessful outreach letter.  Will plan return call in the next 4 business days.    Joylene Draft, RN, BSN  [place] Management Coordinator  [phone]- Mobile [phone]- Toll Free Main Office

## 2019-05-10 ENCOUNTER — Telehealth: Payer: Self-pay | Admitting: Internal Medicine

## 2019-05-10 NOTE — Telephone Encounter (Signed)
    Darlene from [facility] requesting nursing, PT/OT, speech orders. Phone [phone]

## 2019-05-11 DIAGNOSIS — G8929 Other chronic pain: Secondary | ICD-10-CM | POA: Diagnosis not present

## 2019-05-11 DIAGNOSIS — R1312 Dysphagia, oropharyngeal phase: Secondary | ICD-10-CM | POA: Diagnosis not present

## 2019-05-11 DIAGNOSIS — I69391 Dysphagia following cerebral infarction: Secondary | ICD-10-CM | POA: Diagnosis not present

## 2019-05-11 DIAGNOSIS — M1991 Primary osteoarthritis, unspecified site: Secondary | ICD-10-CM | POA: Diagnosis not present

## 2019-05-11 DIAGNOSIS — H5462 Unqualified visual loss, left eye, normal vision right eye: Secondary | ICD-10-CM | POA: Diagnosis not present

## 2019-05-11 DIAGNOSIS — D751 Secondary polycythemia: Secondary | ICD-10-CM | POA: Diagnosis not present

## 2019-05-11 DIAGNOSIS — M545 Low back pain: Secondary | ICD-10-CM | POA: Diagnosis not present

## 2019-05-11 DIAGNOSIS — J9601 Acute respiratory failure with hypoxia: Secondary | ICD-10-CM | POA: Diagnosis not present

## 2019-05-11 DIAGNOSIS — I69354 Hemiplegia and hemiparesis following cerebral infarction affecting left non-dominant side: Secondary | ICD-10-CM | POA: Diagnosis not present

## 2019-05-13 DIAGNOSIS — D751 Secondary polycythemia: Secondary | ICD-10-CM | POA: Diagnosis not present

## 2019-05-13 DIAGNOSIS — M545 Low back pain: Secondary | ICD-10-CM | POA: Diagnosis not present

## 2019-05-13 DIAGNOSIS — I69354 Hemiplegia and hemiparesis following cerebral infarction affecting left non-dominant side: Secondary | ICD-10-CM | POA: Diagnosis not present

## 2019-05-13 DIAGNOSIS — R1312 Dysphagia, oropharyngeal phase: Secondary | ICD-10-CM | POA: Diagnosis not present

## 2019-05-13 DIAGNOSIS — G8929 Other chronic pain: Secondary | ICD-10-CM | POA: Diagnosis not present

## 2019-05-13 DIAGNOSIS — I69391 Dysphagia following cerebral infarction: Secondary | ICD-10-CM | POA: Diagnosis not present

## 2019-05-13 DIAGNOSIS — H5462 Unqualified visual loss, left eye, normal vision right eye: Secondary | ICD-10-CM | POA: Diagnosis not present

## 2019-05-13 DIAGNOSIS — M1991 Primary osteoarthritis, unspecified site: Secondary | ICD-10-CM | POA: Diagnosis not present

## 2019-05-13 DIAGNOSIS — J9601 Acute respiratory failure with hypoxia: Secondary | ICD-10-CM | POA: Diagnosis not present

## 2019-05-14 ENCOUNTER — Other Ambulatory Visit: Payer: Self-pay | Admitting: *Deleted

## 2019-05-14 DIAGNOSIS — R1312 Dysphagia, oropharyngeal phase: Secondary | ICD-10-CM | POA: Diagnosis not present

## 2019-05-14 DIAGNOSIS — D751 Secondary polycythemia: Secondary | ICD-10-CM | POA: Diagnosis not present

## 2019-05-14 DIAGNOSIS — M545 Low back pain: Secondary | ICD-10-CM | POA: Diagnosis not present

## 2019-05-14 DIAGNOSIS — H5462 Unqualified visual loss, left eye, normal vision right eye: Secondary | ICD-10-CM | POA: Diagnosis not present

## 2019-05-14 DIAGNOSIS — M1991 Primary osteoarthritis, unspecified site: Secondary | ICD-10-CM | POA: Diagnosis not present

## 2019-05-14 DIAGNOSIS — G8929 Other chronic pain: Secondary | ICD-10-CM | POA: Diagnosis not present

## 2019-05-14 DIAGNOSIS — J9601 Acute respiratory failure with hypoxia: Secondary | ICD-10-CM | POA: Diagnosis not present

## 2019-05-14 DIAGNOSIS — I69354 Hemiplegia and hemiparesis following cerebral infarction affecting left non-dominant side: Secondary | ICD-10-CM | POA: Diagnosis not present

## 2019-05-14 DIAGNOSIS — I69391 Dysphagia following cerebral infarction: Secondary | ICD-10-CM | POA: Diagnosis not present

## 2019-05-14 NOTE — Patient Outreach (Signed)
[facility]) Care Management  05/14/2019  Henry [name] Mar 28, 1940 [id number]   Telephone screening call Call attempt #2   Referral received : 05/09/19 Referral source : inpatient discharge notification Insurance: [employer]   Patient with inpatient admission at [facility] 2/10-2/18/21  [id number] Covid 19 Discharged to [facility], with discharge home on 05/07/19.   Subjective:  Outreach call to patient preferred contact number, no answer able to leave a HIPAA compliant message for return call.  Placed outreach call to patient , wife contact number no answer able to leave a HIPAA compliant message for return call.    Plan Will plan return call in the next 4 business days if no response.    Joylene Draft, RN, BSN  [place] Management Coordinator  [phone]- Mobile [phone]- Toll Free Main Office

## 2019-05-16 ENCOUNTER — Telehealth: Payer: Self-pay | Admitting: Internal Medicine

## 2019-05-16 NOTE — Telephone Encounter (Signed)
F/u    [facility] calling    Checking on the status of verbal order

## 2019-05-16 NOTE — Telephone Encounter (Signed)
noted 

## 2019-05-16 NOTE — Telephone Encounter (Signed)
New message:    [name] is calling and states that the patients Speech Therapy has been delayed from today to 05/20/19 due to the patients daughter calling and canceling the appt.

## 2019-05-16 NOTE — Telephone Encounter (Signed)
Verbals given, FYI 

## 2019-05-17 ENCOUNTER — Encounter: Payer: Self-pay | Admitting: Internal Medicine

## 2019-05-17 ENCOUNTER — Ambulatory Visit (INDEPENDENT_AMBULATORY_CARE_PROVIDER_SITE_OTHER): Payer: Medicare HMO

## 2019-05-17 ENCOUNTER — Other Ambulatory Visit: Payer: Self-pay | Admitting: *Deleted

## 2019-05-17 ENCOUNTER — Ambulatory Visit (INDEPENDENT_AMBULATORY_CARE_PROVIDER_SITE_OTHER): Payer: Medicare HMO | Admitting: Internal Medicine

## 2019-05-17 ENCOUNTER — Other Ambulatory Visit: Payer: Self-pay

## 2019-05-17 VITALS — BP 120/82 | HR 96 | Temp 97.8°F | Wt 156.0 lb

## 2019-05-17 DIAGNOSIS — I10 Essential (primary) hypertension: Secondary | ICD-10-CM

## 2019-05-17 DIAGNOSIS — R05 Cough: Secondary | ICD-10-CM

## 2019-05-17 DIAGNOSIS — M25512 Pain in left shoulder: Secondary | ICD-10-CM | POA: Diagnosis not present

## 2019-05-17 DIAGNOSIS — I63413 Cerebral infarction due to embolism of bilateral middle cerebral arteries: Secondary | ICD-10-CM

## 2019-05-17 DIAGNOSIS — R059 Cough, unspecified: Secondary | ICD-10-CM

## 2019-05-17 DIAGNOSIS — J1282 Pneumonia due to coronavirus disease 2019: Secondary | ICD-10-CM | POA: Diagnosis not present

## 2019-05-17 DIAGNOSIS — R2232 Localized swelling, mass and lump, left upper limb: Secondary | ICD-10-CM | POA: Diagnosis not present

## 2019-05-17 MED ORDER — ATORVASTATIN CALCIUM 40 MG PO TABS: 40.0000 mg | ORAL_TABLET | Freq: Every day | ORAL | 3 refills | Status: DC

## 2019-05-17 NOTE — Patient Outreach (Addendum)
[facility] [facility]) Care Management  05/17/2019  VALERIO MATSUI May 23, 1940 [id number]   Telephone screening call Call attempt #3 Initial outreach date : 05/09/19  Referral received : 05/09/19 Referral source : inpatient discharge notification Insurance: [employer]   Patient with inpatient admission at [facility] 2/10-2/18/21 [id number] Covid 19 Discharged to [facility], with discharge home on 05/07/19.    Subjective: Unsuccessful outreach call to patient preferred contact number, no answer able to leave a HIPAA compliant message for return call.   Plan Will await return call, if no response will plan case closure on day 10 per work flow .   Joylene Draft, RN, BSN  [place] Management Coordinator  [phone]- Mobile [phone]- Toll Free Main Office
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
## 2019-05-17 NOTE — Patient Instructions (Signed)
Please continue all other medications as before, and refills have been done if requested - the lipitor  Please have the pharmacy call with any other refills you may need.  Please continue your efforts at being more active, low cholesterol diet, and weight control.  Please keep your appointments with your specialists as you may have planned  Please go to the XRAY Department in the first floor for the x-ray testing  Depending on the results, you may need to see Sports Medicine in this facility, or Orthopedics

## 2019-05-19 ENCOUNTER — Encounter: Payer: Self-pay | Admitting: Internal Medicine

## 2019-05-19 DIAGNOSIS — M25512 Pain in left shoulder: Secondary | ICD-10-CM | POA: Insufficient documentation

## 2019-05-19 NOTE — Assessment & Plan Note (Addendum)
With some swelling, for plain films and fu sport medicine if no bony abnormal  I spent 42 minutes in preparing to see the patient by review of recent labs, imaging and procedures, obtaining and reviewing separately obtained history, communicating with the patient and family or caregiver, ordering medications, tests or procedures, and documenting clinical information in the EHR including the differential Dx, treatment, and any further evaluation and other management of left shoulder pain, HTN, hx of stroke, cough

## 2019-05-19 NOTE — Assessment & Plan Note (Signed)
stable overall by history and exam, recent data reviewed with pt, and pt to continue medical treatment as before,  to f/u any worsening symptoms or concerns  

## 2019-05-19 NOTE — Assessment & Plan Note (Signed)
Also for cxr,  to f/u any worsening symptoms or concerns 

## 2019-05-20 ENCOUNTER — Telehealth: Payer: Self-pay | Admitting: Internal Medicine

## 2019-05-20 DIAGNOSIS — H5462 Unqualified visual loss, left eye, normal vision right eye: Secondary | ICD-10-CM | POA: Diagnosis not present

## 2019-05-20 DIAGNOSIS — R1312 Dysphagia, oropharyngeal phase: Secondary | ICD-10-CM | POA: Diagnosis not present

## 2019-05-20 DIAGNOSIS — M545 Low back pain: Secondary | ICD-10-CM | POA: Diagnosis not present

## 2019-05-20 DIAGNOSIS — I69354 Hemiplegia and hemiparesis following cerebral infarction affecting left non-dominant side: Secondary | ICD-10-CM | POA: Diagnosis not present

## 2019-05-20 DIAGNOSIS — G8929 Other chronic pain: Secondary | ICD-10-CM | POA: Diagnosis not present

## 2019-05-20 DIAGNOSIS — D751 Secondary polycythemia: Secondary | ICD-10-CM | POA: Diagnosis not present

## 2019-05-20 DIAGNOSIS — M1991 Primary osteoarthritis, unspecified site: Secondary | ICD-10-CM | POA: Diagnosis not present

## 2019-05-20 DIAGNOSIS — I69391 Dysphagia following cerebral infarction: Secondary | ICD-10-CM | POA: Diagnosis not present

## 2019-05-20 DIAGNOSIS — J9601 Acute respiratory failure with hypoxia: Secondary | ICD-10-CM | POA: Diagnosis not present

## 2019-05-20 NOTE — Telephone Encounter (Signed)
Requesting Verbal Orders : Speech Therapy  1 x 2 2 x 3  Henry [name] - [phone] Okay to leave a voicemail.

## 2019-05-21 DIAGNOSIS — H5462 Unqualified visual loss, left eye, normal vision right eye: Secondary | ICD-10-CM | POA: Diagnosis not present

## 2019-05-21 DIAGNOSIS — J9601 Acute respiratory failure with hypoxia: Secondary | ICD-10-CM | POA: Diagnosis not present

## 2019-05-21 DIAGNOSIS — I69391 Dysphagia following cerebral infarction: Secondary | ICD-10-CM | POA: Diagnosis not present

## 2019-05-21 DIAGNOSIS — D751 Secondary polycythemia: Secondary | ICD-10-CM | POA: Diagnosis not present

## 2019-05-21 DIAGNOSIS — I69354 Hemiplegia and hemiparesis following cerebral infarction affecting left non-dominant side: Secondary | ICD-10-CM | POA: Diagnosis not present

## 2019-05-21 DIAGNOSIS — R1312 Dysphagia, oropharyngeal phase: Secondary | ICD-10-CM | POA: Diagnosis not present

## 2019-05-21 DIAGNOSIS — G8929 Other chronic pain: Secondary | ICD-10-CM | POA: Diagnosis not present

## 2019-05-21 DIAGNOSIS — M1991 Primary osteoarthritis, unspecified site: Secondary | ICD-10-CM | POA: Diagnosis not present

## 2019-05-21 DIAGNOSIS — M545 Low back pain: Secondary | ICD-10-CM | POA: Diagnosis not present

## 2019-05-21 NOTE — Telephone Encounter (Signed)
Verbal orders given, FYI

## 2019-05-21 NOTE — Telephone Encounter (Signed)
   Henry [name] from [facility] requesting verbal orders for PT 2x week for 6 weeks 1x week for 2 weeks  Please call [phone]

## 2019-05-22 ENCOUNTER — Other Ambulatory Visit: Payer: Self-pay | Admitting: *Deleted

## 2019-05-22 DIAGNOSIS — D751 Secondary polycythemia: Secondary | ICD-10-CM | POA: Diagnosis not present

## 2019-05-22 DIAGNOSIS — M1991 Primary osteoarthritis, unspecified site: Secondary | ICD-10-CM | POA: Diagnosis not present

## 2019-05-22 DIAGNOSIS — G8929 Other chronic pain: Secondary | ICD-10-CM | POA: Diagnosis not present

## 2019-05-22 DIAGNOSIS — M545 Low back pain: Secondary | ICD-10-CM | POA: Diagnosis not present

## 2019-05-22 DIAGNOSIS — J9601 Acute respiratory failure with hypoxia: Secondary | ICD-10-CM | POA: Diagnosis not present

## 2019-05-22 DIAGNOSIS — I69354 Hemiplegia and hemiparesis following cerebral infarction affecting left non-dominant side: Secondary | ICD-10-CM | POA: Diagnosis not present

## 2019-05-22 DIAGNOSIS — R1312 Dysphagia, oropharyngeal phase: Secondary | ICD-10-CM | POA: Diagnosis not present

## 2019-05-22 DIAGNOSIS — I69391 Dysphagia following cerebral infarction: Secondary | ICD-10-CM | POA: Diagnosis not present

## 2019-05-22 DIAGNOSIS — H5462 Unqualified visual loss, left eye, normal vision right eye: Secondary | ICD-10-CM | POA: Diagnosis not present

## 2019-05-22 NOTE — Patient Outreach (Signed)
[facility]) Care Management  05/22/2019  Henry [name] [birth year] [id number]   Telephone screening call Call attempt #3 Initial outreach date : 05/09/19  Referral received : 05/09/19 Referral source : inpatient discharge notification Insurance: [employer]    Unsuccessful outreach call to patient x 3 , no response to unsuccessful outreach letter.    Plan Will plan case closure, unable to contact patient .    Joylene Draft, RN, BSN  [place] Management Coordinator  [phone]- Mobile [phone]- Toll Free Main Office

## 2019-05-24 DIAGNOSIS — D751 Secondary polycythemia: Secondary | ICD-10-CM | POA: Diagnosis not present

## 2019-05-24 DIAGNOSIS — J9601 Acute respiratory failure with hypoxia: Secondary | ICD-10-CM | POA: Diagnosis not present

## 2019-05-24 DIAGNOSIS — I69354 Hemiplegia and hemiparesis following cerebral infarction affecting left non-dominant side: Secondary | ICD-10-CM | POA: Diagnosis not present

## 2019-05-24 DIAGNOSIS — M545 Low back pain: Secondary | ICD-10-CM | POA: Diagnosis not present

## 2019-05-24 DIAGNOSIS — H5462 Unqualified visual loss, left eye, normal vision right eye: Secondary | ICD-10-CM | POA: Diagnosis not present

## 2019-05-24 DIAGNOSIS — M1991 Primary osteoarthritis, unspecified site: Secondary | ICD-10-CM | POA: Diagnosis not present

## 2019-05-24 DIAGNOSIS — R1312 Dysphagia, oropharyngeal phase: Secondary | ICD-10-CM | POA: Diagnosis not present

## 2019-05-24 DIAGNOSIS — G8929 Other chronic pain: Secondary | ICD-10-CM | POA: Diagnosis not present

## 2019-05-24 DIAGNOSIS — I69391 Dysphagia following cerebral infarction: Secondary | ICD-10-CM | POA: Diagnosis not present

## 2019-05-27 DIAGNOSIS — R1312 Dysphagia, oropharyngeal phase: Secondary | ICD-10-CM | POA: Diagnosis not present

## 2019-05-27 DIAGNOSIS — M545 Low back pain: Secondary | ICD-10-CM | POA: Diagnosis not present

## 2019-05-27 DIAGNOSIS — D751 Secondary polycythemia: Secondary | ICD-10-CM | POA: Diagnosis not present

## 2019-05-27 DIAGNOSIS — M1991 Primary osteoarthritis, unspecified site: Secondary | ICD-10-CM | POA: Diagnosis not present

## 2019-05-27 DIAGNOSIS — G8929 Other chronic pain: Secondary | ICD-10-CM | POA: Diagnosis not present

## 2019-05-27 DIAGNOSIS — I69391 Dysphagia following cerebral infarction: Secondary | ICD-10-CM | POA: Diagnosis not present

## 2019-05-27 DIAGNOSIS — H5462 Unqualified visual loss, left eye, normal vision right eye: Secondary | ICD-10-CM | POA: Diagnosis not present

## 2019-05-27 DIAGNOSIS — J9601 Acute respiratory failure with hypoxia: Secondary | ICD-10-CM | POA: Diagnosis not present

## 2019-05-27 DIAGNOSIS — I69354 Hemiplegia and hemiparesis following cerebral infarction affecting left non-dominant side: Secondary | ICD-10-CM | POA: Diagnosis not present

## 2019-05-28 DIAGNOSIS — R1312 Dysphagia, oropharyngeal phase: Secondary | ICD-10-CM | POA: Diagnosis not present

## 2019-05-28 DIAGNOSIS — J9601 Acute respiratory failure with hypoxia: Secondary | ICD-10-CM | POA: Diagnosis not present

## 2019-05-28 DIAGNOSIS — I69354 Hemiplegia and hemiparesis following cerebral infarction affecting left non-dominant side: Secondary | ICD-10-CM | POA: Diagnosis not present

## 2019-05-28 DIAGNOSIS — D751 Secondary polycythemia: Secondary | ICD-10-CM | POA: Diagnosis not present

## 2019-05-28 DIAGNOSIS — I69391 Dysphagia following cerebral infarction: Secondary | ICD-10-CM | POA: Diagnosis not present

## 2019-05-28 DIAGNOSIS — M545 Low back pain: Secondary | ICD-10-CM | POA: Diagnosis not present

## 2019-05-28 DIAGNOSIS — G8929 Other chronic pain: Secondary | ICD-10-CM | POA: Diagnosis not present

## 2019-05-28 DIAGNOSIS — H5462 Unqualified visual loss, left eye, normal vision right eye: Secondary | ICD-10-CM | POA: Diagnosis not present

## 2019-05-28 DIAGNOSIS — M1991 Primary osteoarthritis, unspecified site: Secondary | ICD-10-CM | POA: Diagnosis not present

## 2019-05-29 ENCOUNTER — Telehealth: Payer: Self-pay | Admitting: Internal Medicine

## 2019-05-29 ENCOUNTER — Other Ambulatory Visit: Payer: Self-pay

## 2019-05-29 ENCOUNTER — Ambulatory Visit (INDEPENDENT_AMBULATORY_CARE_PROVIDER_SITE_OTHER): Payer: Medicare HMO | Admitting: Internal Medicine

## 2019-05-29 ENCOUNTER — Encounter: Payer: Self-pay | Admitting: Internal Medicine

## 2019-05-29 VITALS — BP 118/74 | HR 99 | Temp 98.3°F | Ht 68.0 in | Wt 156.5 lb

## 2019-05-29 DIAGNOSIS — I69391 Dysphagia following cerebral infarction: Secondary | ICD-10-CM | POA: Diagnosis not present

## 2019-05-29 DIAGNOSIS — R202 Paresthesia of skin: Secondary | ICD-10-CM | POA: Diagnosis not present

## 2019-05-29 DIAGNOSIS — G8929 Other chronic pain: Secondary | ICD-10-CM | POA: Diagnosis not present

## 2019-05-29 DIAGNOSIS — I10 Essential (primary) hypertension: Secondary | ICD-10-CM

## 2019-05-29 DIAGNOSIS — I6601 Occlusion and stenosis of right middle cerebral artery: Secondary | ICD-10-CM | POA: Diagnosis not present

## 2019-05-29 DIAGNOSIS — I63413 Cerebral infarction due to embolism of bilateral middle cerebral arteries: Secondary | ICD-10-CM

## 2019-05-29 DIAGNOSIS — I69354 Hemiplegia and hemiparesis following cerebral infarction affecting left non-dominant side: Secondary | ICD-10-CM | POA: Diagnosis not present

## 2019-05-29 DIAGNOSIS — R739 Hyperglycemia, unspecified: Secondary | ICD-10-CM | POA: Diagnosis not present

## 2019-05-29 DIAGNOSIS — E785 Hyperlipidemia, unspecified: Secondary | ICD-10-CM

## 2019-05-29 DIAGNOSIS — D751 Secondary polycythemia: Secondary | ICD-10-CM | POA: Diagnosis not present

## 2019-05-29 DIAGNOSIS — R1312 Dysphagia, oropharyngeal phase: Secondary | ICD-10-CM | POA: Diagnosis not present

## 2019-05-29 DIAGNOSIS — H5462 Unqualified visual loss, left eye, normal vision right eye: Secondary | ICD-10-CM | POA: Diagnosis not present

## 2019-05-29 DIAGNOSIS — M545 Low back pain: Secondary | ICD-10-CM | POA: Diagnosis not present

## 2019-05-29 DIAGNOSIS — E559 Vitamin D deficiency, unspecified: Secondary | ICD-10-CM

## 2019-05-29 DIAGNOSIS — J9601 Acute respiratory failure with hypoxia: Secondary | ICD-10-CM | POA: Diagnosis not present

## 2019-05-29 DIAGNOSIS — M1991 Primary osteoarthritis, unspecified site: Secondary | ICD-10-CM | POA: Diagnosis not present

## 2019-05-29 LAB — BASIC METABOLIC PANEL
BUN: 14 mg/dL (ref 6–23)
CO2: 31 mEq/L (ref 19–32)
Calcium: 9.8 mg/dL (ref 8.4–10.5)
Chloride: 102 mEq/L (ref 96–112)
Creatinine, Ser: 0.74 mg/dL (ref 0.40–1.50)
GFR: 102.52 mL/min (ref >60.00)
Glucose, Bld: 114 mg/dL — ABNORMAL HIGH (ref 70–99)
Potassium: 4.4 mEq/L (ref 3.5–5.1)
Sodium: 139 mEq/L (ref 135–145)

## 2019-05-29 LAB — CBC WITH DIFFERENTIAL/PLATELET
Basophils Absolute: 0 10*3/uL (ref 0.0–0.1)
Basophils Relative: 0.6 % (ref 0.0–3.0)
Eosinophils Absolute: 0.4 10*3/uL (ref 0.0–0.7)
Eosinophils Relative: 6.8 % — ABNORMAL HIGH (ref 0.0–5.0)
HCT: 38 % — ABNORMAL LOW (ref 39.0–52.0)
Hemoglobin: 13 g/dL (ref 13.0–17.0)
Lymphocytes Relative: 39.5 % (ref 12.0–46.0)
Lymphs Abs: 2.5 10*3/uL (ref 0.7–4.0)
MCHC: 34.1 g/dL (ref 30.0–36.0)
MCV: 109.6 fl — ABNORMAL HIGH (ref 78.0–100.0)
Monocytes Absolute: 0.7 10*3/uL (ref 0.1–1.0)
Monocytes Relative: 11.5 % (ref 3.0–12.0)
Neutro Abs: 2.7 10*3/uL (ref 1.4–7.7)
Neutrophils Relative %: 41.6 % — ABNORMAL LOW (ref 43.0–77.0)
Platelets: 258 10*3/uL (ref 150.0–400.0)
RBC: 3.46 Mil/uL — ABNORMAL LOW (ref 4.22–5.81)
RDW: 14.8 % (ref 11.5–15.5)
WBC: 6.4 10*3/uL (ref 4.0–10.5)

## 2019-05-29 LAB — HEMOGLOBIN A1C: Hgb A1c MFr Bld: 6.6 % — ABNORMAL HIGH (ref 4.6–6.5)

## 2019-05-29 LAB — HEPATIC FUNCTION PANEL
ALT: 33 U/L (ref 0–53)
AST: 31 U/L (ref 0–37)
Albumin: 3.7 g/dL (ref 3.5–5.2)
Alkaline Phosphatase: 100 U/L (ref 39–117)
Bilirubin, Direct: 0.2 mg/dL (ref 0.0–0.3)
Total Bilirubin: 0.5 mg/dL (ref 0.2–1.2)
Total Protein: 7 g/dL (ref 6.0–8.3)

## 2019-05-29 LAB — VITAMIN B12: Vitamin B-12: 270 pg/mL (ref 211–911)

## 2019-05-29 LAB — VITAMIN D 25 HYDROXY (VIT D DEFICIENCY, FRACTURES): VITD: 51.95 ng/mL (ref 30.00–100.00)

## 2019-05-29 LAB — TSH: TSH: 1.26 u[IU]/mL (ref 0.35–4.50)

## 2019-05-29 MED ORDER — VITAMIN D3 50 MCG (2000 UT) PO CAPS: 2000.0000 [IU] | ORAL_CAPSULE | Freq: Every day | ORAL | 3 refills | Status: AC

## 2019-05-29 MED ORDER — DILTIAZEM HCL 60 MG PO TABS: 60.0000 mg | ORAL_TABLET | Freq: Two times a day (BID) | ORAL | 11 refills | Status: DC

## 2019-05-29 MED ORDER — ATORVASTATIN CALCIUM 40 MG PO TABS: 40.0000 mg | ORAL_TABLET | Freq: Every day | ORAL | 11 refills | Status: DC

## 2019-05-29 MED ORDER — APIXABAN 5 MG PO TABS: 5.0000 mg | ORAL_TABLET | Freq: Two times a day (BID) | ORAL | 11 refills | Status: DC

## 2019-05-29 NOTE — Telephone Encounter (Signed)
New Message:   Henry [name] from [facility] is requesting verbal orders to change the frequency of their current visits. Per the family they request it be changed to 1x a week for 4 more weeks. Please advise.

## 2019-05-29 NOTE — Assessment & Plan Note (Signed)
Eliquis In PT/OT W/c bound

## 2019-05-29 NOTE — Assessment & Plan Note (Signed)
CBC

## 2019-05-29 NOTE — Assessment & Plan Note (Signed)
Eliquis Labs

## 2019-05-29 NOTE — Assessment & Plan Note (Signed)
BP Readings from Last 3 Encounters:  05/29/19 118/74  05/17/19 120/82  04/25/19 102/71    On diltiazem

## 2019-05-29 NOTE — Assessment & Plan Note (Signed)
Atorvastatin

## 2019-05-29 NOTE — Progress Notes (Signed)
Subjective:  Patient ID: Henry [name], male    DOB: [birth year]  Age: 79 y.o. MRN: [id number]  CC: No chief complaint on file.   HPI Henry [name] presents for CVA    Admit date: 04/17/2019 Discharge date: 04/25/2019  Admitted From: Home Disposition: SNF  Recommendations for Outpatient Follow-up:  Follow up with PCP in 1-2 weeks  PCP Please obtain BMP/CBC, 2 view CXR in 1week, (see Discharge instructions)  PCP Please follow up on the following pending results:  Home Health: None  Equipment/Devices: None  Consultations: Neuro  Discharge Condition: Stable  CODE STATUS: Full  Diet Recommendation: Dysphagia 1 diet with Honey thick liquids, full feeding assistance and aspiration precautions. Must have all meals in chair sitting up.  CC -shortness of breath  Brief history of present illness from the day of admission and additional interim summary   Patient is a 79 y.o. male with PMHx of HTN, polycythemia, chronic GERD, chronic low back pain who was admitted on 2/10 following a right MCA stroke-patient was taken by IR for revascularization procedure as patient was outside the TPA window-following which he was admitted to the ICU and required ventilator support. Patient was also found to have COVID-19. Upon clinical stability-and extubation-patient was transferred to the [facility] hospitalist service on 2/12.  Significant events:  2/10>> admitted with acute right MCA stroke-to IR for revascularization of Right MCA-return to ICU on a ventilator  2/11>> extubated  2/12>> transfer to [facility] Course  Acute Hypoxic Resp Failure due to Covid 19 Viral pneumonia: Remains stable-on just 1 L of oxygen-he was extubated on 2/12-  Sputum cultures positive on 2/10 for MSSA. He is also status post extubation on 2/12. Suspect that he may have superimposed aspiration pneumonia - was initially on Ancef but now has been transitioned to oral doxycycline with a stop date of 04/28/2019. Finished remdesivir  continue steroids. Trend procalcitonin clinically improving. Continue dysphagia 1 diet and honey thick liquids at SNF with aspiration precautions and feeding assistance as needed. Must have all his meals sitting up in the chair. Remains very high risk for repeat aspiration. Long-term prognosis remains guarded but he is making small improvements every day. Currently stable on room air and has made good respiratory progress.  Last Labs                                                                                              Hepatic Function Latest Ref Rng & Units 04/25/2019 04/24/2019 04/23/2019  Total Protein 6.5 - 8.1 g/dL 6.5 7.4 5.6(L)  Albumin 3.5 - 5.0 g/dL 2.4(L) 2.3(L) 2.1(L)  AST 15 - 41 U/L 67(H) 105(H) 70(H)  ALT 0 - 44 U/L 102(H) 102(H) 64(H)  Alk Phosphatase 38 - 126 U/L 108 115 97  Total Bilirubin 0.3 - 1.2 mg/dL 0.8 0.9 0.8  Bilirubin, Direct 0.0 - 0.3 mg/dL - - -   Clinical dehydration. Resolved after repeat hydration with IV fluids on 04/23/2019, continue to monitor closely at SNF, encourage staff to put him in the chair and offer him more food. Continue PT OT.  Left lower extremity  DVT: Likely provoked by COVID-19-on IV heparin >> Eliquis.  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 Patient is s/p revascularization of right M1 occlusion by interventional radiology. 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 Echo with preserved EF and without any embolic foci. LDL 53, A1c 6.8. Has gross dysarthria, left facial droop, and significant weakness in the left upper extremity. Stroke MD now switched to Eliquis and statin for dyslipidemia, continue PT OT, still has dense left upper extremity hemiparesis, also ordered 30-day event monitor post discharge per neuro recommendation. A1c at goal for his age. Continue PT OT and speech follow-up at SNF with outpatient PCP and neurology follow-up post discharge.  Dysphagia:  Secondary to CVA-followed by SLP-on dysphagia 1 diet with honey thick liquids, feeding assistance aspiration precautions and speech follow-up to continue at SNF. All meals in chair.  HTN: Stable on low-dose Cardizem.  Discharge diagnosis   Active Problems:  Stroke (cerebrum) ([place])  Middle cerebral artery embolism, right  Acute respiratory failure ([place])  COVID-19   Discharge instructions        Discharge Instructions     Ambulatory referral to Neurology  Complete by: As directed      Outpatient Medications Prior to Visit  Medication Sig Dispense Refill  . apixaban (ELIQUIS) 5 MG TABS tablet Take 1 tablet (5 mg total) by mouth 2 (two) times daily. 60 tablet   . atorvastatin (LIPITOR) 40 MG tablet Take 1 tablet (40 mg total) by mouth daily at 6 PM. 90 tablet 3  . cholecalciferol (VITAMIN D) 1000 units tablet Take 1,000 Units by mouth daily.    [name] diltiazem (CARDIZEM) 60 MG tablet Take 1 tablet (60 mg total) by mouth every 12 (twelve) hours.    . Misc Natural Products (PROSTATE HEALTH) CAPS Take 1 capsule by mouth daily.    . Multiple Vitamins-Minerals (SUPER MEGA VITE 75/BETA CARO PO) Take 1 tablet by mouth 2 (two) times daily.    . Probiotic Product (ALIGN PO) Take 1 capsule by mouth daily.     No facility-administered medications prior to visit.    ROS: Review of Systems  Constitutional: Negative for appetite change, fatigue and unexpected weight change.  HENT: Negative for congestion, nosebleeds, sneezing, sore throat and trouble swallowing.   Eyes: Negative for itching and visual disturbance.  Respiratory: Negative for cough.   Cardiovascular: Negative for chest pain, palpitations and leg swelling.  Gastrointestinal: Negative for abdominal distention, blood in stool, diarrhea and nausea.  Genitourinary: Negative for frequency and hematuria.  Musculoskeletal: Positive for gait problem. Negative for back pain, joint swelling and neck pain.  Skin: Negative for rash.   Neurological: Negative for dizziness, tremors, speech difficulty and weakness.  Psychiatric/Behavioral: Negative for agitation, dysphoric mood and sleep disturbance. The patient is not nervous/anxious.     Objective:  BP 118/74 (BP Location: Right Arm, Patient Position: Sitting, Cuff Size: Normal)   Pulse 99   Temp 98.3 F (36.8 C) (Oral)   Ht '5\' 8"'  (1.727 m)   Wt 156 lb 8 oz (71 kg)   SpO2 96%   BMI 23.80 kg/m   BP Readings from Last 3 Encounters:  05/29/19 118/74  05/17/19 120/82  04/25/19 102/71    Wt Readings from Last 3 Encounters:  05/29/19 156 lb 8 oz (71 kg)  05/17/19 156 lb (70.8 kg)  04/18/19 176 lb 9.4 oz (80.1 kg)    Physical Exam Constitutional:  General: He is not in acute distress.    Appearance: He is well-developed.     Comments: NAD  Eyes:     Conjunctiva/sclera: Conjunctivae normal.     Pupils: Pupils are equal, round, and reactive to light.  Neck:     Thyroid: No thyromegaly.     Vascular: No JVD.  Cardiovascular:     Rate and Rhythm: Normal rate and regular rhythm.     Heart sounds: Normal heart sounds. No murmur. No friction rub. No gallop.   Pulmonary:     Effort: Pulmonary effort is normal. No respiratory distress.     Breath sounds: Normal breath sounds. No wheezing or rales.  Chest:     Chest wall: No tenderness.  Abdominal:     General: Bowel sounds are normal. There is no distension.     Palpations: Abdomen is soft. There is no mass.     Tenderness: There is no abdominal tenderness. There is no guarding or rebound.  Musculoskeletal:        General: No tenderness. Normal range of motion.     Cervical back: Normal range of motion.  Lymphadenopathy:     Cervical: No cervical adenopathy.  Skin:    General: Skin is warm and dry.     Findings: No rash.  Neurological:     Mental Status: He is alert and oriented to person, place, and time.     Cranial Nerves: No cranial nerve deficit.     Motor: No abnormal muscle tone.      Coordination: Coordination normal.     Gait: Gait normal.     Deep Tendon Reflexes: Reflexes are normal and symmetric.  Psychiatric:        Behavior: Behavior normal.        Thought Content: Thought content normal.        Judgment: Judgment normal.   A complex case. Hospital records reviewed. DMV form. Care discussed w/wife, dtr and gdtr >45 min total  Lab Results  Component Value Date   WBC 8.0 04/25/2019   HGB 13.0 04/25/2019   HCT 38.8 (L) 04/25/2019   PLT 168 04/25/2019   GLUCOSE 121 (H) 04/25/2019   CHOL 93 04/18/2019   TRIG 108 04/25/2019   HDL 19 (L) 04/18/2019   LDLDIRECT 135.0 02/24/2017   LDLCALC 53 04/18/2019   ALT 102 (H) 04/25/2019   AST 67 (H) 04/25/2019   NA 139 04/25/2019   K 4.1 04/25/2019   CL 102 04/25/2019   CREATININE 1.04 04/25/2019   BUN 26 (H) 04/25/2019   CO2 28 04/25/2019   TSH 1.88 02/24/2017   PSA 1.75 02/24/2017   INR 1.2 04/17/2019   HGBA1C 6.8 (H) 04/17/2019    CT ANGIO HEAD W OR WO CONTRAST  Result Date: 04/17/2019 CLINICAL DATA:  Stroke, follow-up. EXAM: CT ANGIOGRAPHY HEAD AND NECK CT PERFUSION BRAIN TECHNIQUE: Multidetector CT imaging of the head and neck was performed using the standard protocol during bolus administration of intravenous contrast. Multiplanar CT image reconstructions and MIPs were obtained to evaluate the vascular anatomy. Carotid stenosis measurements (when applicable) are obtained utilizing NASCET criteria, using the distal internal carotid diameter as the denominator. Multiphase CT imaging of the brain was performed following IV bolus contrast injection. Subsequent parametric perfusion maps were calculated using RAPID software. CONTRAST:  135m OMNIPAQUE IOHEXOL 350 MG/ML SOLN COMPARISON:  Noncontrast head CT performed earlier the same day. FINDINGS: CTA NECK FINDINGS Aortic arch: The origins of the innominate and left common carotid  arteries are incompletely imaged. The visualized aortic arch is unremarkable. No  significant innominate or proximal subclavian stenosis visualized. Right carotid system: CCA patent to the bifurcation without stenosis. Prominent predominantly calcified plaque within the distal common carotid artery and proximal ICA. There is likely greater than 70% stenosis at the origin of the ICA, although exact quantification of stenosis is difficult due to irregularity of calcified plaque. Distal to this the ICA is patent within the neck without stenosis. Left carotid system: CCA patent to the bifurcation without stenosis. Calcified plaque within the distal common carotid artery, carotid bifurcation and proximal ICA. Resultant 30-40% narrowing of the proximal ICA as compared to the more distal vessel. Distal to this the ICA is patent within the neck without stenosis. Vertebral arteries: Right vertebral artery dominant. 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 Calcified plaque at the origin of the medial V1 left vertebral artery branch with at least moderate ostial stenosis. More distally the bilateral vertebral arteries are patent within the neck without stenosis. Skeleton: No acute bony abnormality. Cervical spondylosis with C5-C6 and C6-C7 posterior disc osteophytes. Other neck: No neck mass or cervical adenopathy. Upper chest: Paraseptal emphysema within the imaged lung apices. Also within the imaged lung apices but there is interstitial prominence suspicious for interstitial lung disease. There are more nodular opacities within the right upper lobe measuring up to 1.4 cm (series 7, image 56) (series 7, image 99). Review of the MIP images confirms the above findings CTA HEAD FINDINGS Anterior circulation: The intracranial internal carotid arteries are patent with scattered calcified plaque. No more than mild stenosis within these vessels. 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 There is minimal reconstitution of flow seen within M2 and more distal right MCA branch vessels. The left  middle cerebral artery is patent without high-grade proximal stenosis. The bilateral anterior cerebral arteries are patent without significant proximal stenosis. Posterior circulation: The intracranial vertebral arteries are patent without significant stenosis, as is the basilar artery. The bilateral posterior cerebral arteries are patent without significant proximal stenosis. A sizable right posterior communicating artery is present. The a left posterior communicating artery is poorly delineated and may be hypoplastic or absent. Venous sinuses: Within limitations of contrast timing, no convincing thrombus. Anatomic variants: As described Review of the MIP images confirms the above findings CT Brain Perfusion Findings: ASPECTS: 4 CBF (<30%) Volume: 45m (in the right MCA vascular territory) Perfusion (Tmax>6.0s) volume: 147 mL (in the right MCA vascular territory) Mismatch Volume: 1349mInfarction Location:Right MCA vascular territory Findings of a proximal M1 right MCA occlusion and the perfusion results were discussed by telephone at the time of interpretation on 04/17/2019 at 10:38 am to provider Dr. ArLorraine Laxwho verbally acknowledged these results. IMPRESSION: CTA neck: 1. The common and internal carotid arteries are patent within the neck bilaterally. 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 There is likely greater than 70% stenosis at the origin of the right ICA. 30-40% stenosis of the proximal left ICA. 2. The vertebral arteries are patent within the neck bilaterally. The right vertebral artery is dominant with moderate atherosclerotic narrowing at its origin. Duplicated V1 left vertebral artery. There is at least moderate ostial stenosis at the origin of the medial left V1 vertebral artery branch. 3. The partially imaged lung apices demonstrate findings suspicious for interstitial lung disease. Additionally, there are nodular opacities within the right lung apex measuring up to 1.4 cm.  Dedicated chest CT is recommended for further evaluation when clinically feasible.  CTA head: 1. 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 There is minimal reconstitution of flow within M2 and more distal right MCA branch vessels. 2. No other intracranial large vessel occlusion or proximal high-grade arterial stenosis identified. CT perfusion head: The perfusion software identifies a 10 mL core infarct within the right MCA vascular territory. However, please note more extensive infarction changes were appreciated on concurrently performed noncontrast head CT. The perfusion software detects a 147 mL region of hypoperfused parenchyma within the right MCA vascular territory. Reported mismatch 137 mL. Electronically Signed   By: Kellie Simmering DO   On: 04/17/2019 11:04   CT HEAD WO CONTRAST  Result Date: 04/17/2019 CLINICAL DATA:  Follow-up intervention for right MCA occlusion. EXAM: CT HEAD WITHOUT CONTRAST TECHNIQUE: Contiguous axial images were obtained from the base of the skull through the vertex without intravenous contrast. COMPARISON:  Interventional images same day.  Head CT same day. FINDINGS: Brain: In the region the small core infarction in the lateral temporal lobe and the penumbra of the majority of the right middle cerebral artery cortical territory, there is brain swelling with effacement of the sulci and areas of patchy low-density as well as some areas of increased attenuation. Low-density present within the right basal ganglia. The appearance of the increased attenuation is more consistent with post procedural contrast staining than intraparenchymal hemorrhage. Hemorrhage is not excluded but not favored. Certainly, there is no frank hematoma. No evidence of midline shift. No hydrocephalus. No extra-axial collection. Vascular: No acute vascular finding primarily. Skull: Negative Sinuses/Orbits: Clear except for mild mucosal thickening. Other: None IMPRESSION: Ischemic changes in the  right MCA territory affecting the cortical brain, subcortical brain and right basal ganglia. Areas of low-density as well as areas of indistinct higher density favored to represent contrast staining rather than frank hemorrhage. Low level petechial bleeding is not excluded. There is no discrete hematoma. No mass effect or shift, despite mild-to-moderate swelling in the region of affected brain. Electronically Signed   By: Nelson Chimes M.D.   On: 04/17/2019 20:35   CT ANGIO NECK W OR WO CONTRAST  Result Date: 04/17/2019 CLINICAL DATA:  Stroke, follow-up. EXAM: CT ANGIOGRAPHY HEAD AND NECK CT PERFUSION BRAIN TECHNIQUE: Multidetector CT imaging of the head and neck was performed using the standard protocol during bolus administration of intravenous contrast. Multiplanar CT image reconstructions and MIPs were obtained to evaluate the vascular anatomy. Carotid stenosis measurements (when applicable) are obtained utilizing NASCET criteria, using the distal internal carotid diameter as the denominator. Multiphase CT imaging of the brain was performed following IV bolus contrast injection. Subsequent parametric perfusion maps were calculated using RAPID software. CONTRAST:  116m OMNIPAQUE IOHEXOL 350 MG/ML SOLN COMPARISON:  Noncontrast head CT performed earlier the same day. FINDINGS: CTA NECK FINDINGS Aortic arch: The origins of the innominate and left common carotid arteries are incompletely imaged. The visualized aortic arch is unremarkable. No significant innominate or proximal subclavian stenosis visualized. Right carotid system: CCA patent to the bifurcation without stenosis. Prominent predominantly calcified plaque within the distal common carotid artery and proximal ICA. There is likely greater than 70% stenosis at the origin of the ICA, although exact quantification of stenosis is difficult due to irregularity of calcified plaque. Distal to this the ICA is patent within the neck without stenosis. Left carotid  system: CCA patent to the bifurcation without stenosis. Calcified plaque within the distal common carotid artery, carotid bifurcation and proximal ICA. Resultant 30-40% narrowing of the proximal ICA  as compared to the more distal vessel. Distal to this the ICA is patent within the neck without stenosis. Vertebral arteries: Right vertebral artery dominant. 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 Calcified plaque at the origin of the medial V1 left vertebral artery branch with at least moderate ostial stenosis. More distally the bilateral vertebral arteries are patent within the neck without stenosis. Skeleton: No acute bony abnormality. Cervical spondylosis with C5-C6 and C6-C7 posterior disc osteophytes. Other neck: No neck mass or cervical adenopathy. Upper chest: Paraseptal emphysema within the imaged lung apices. Also within the imaged lung apices but there is interstitial prominence suspicious for interstitial lung disease. There are more nodular opacities within the right upper lobe measuring up to 1.4 cm (series 7, image 56) (series 7, image 99). Review of the MIP images confirms the above findings CTA HEAD FINDINGS Anterior circulation: The intracranial internal carotid arteries are patent with scattered calcified plaque. No more than mild stenosis within these vessels. 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 There is minimal reconstitution of flow seen within M2 and more distal right MCA branch vessels. The left middle cerebral artery is patent without high-grade proximal stenosis. The bilateral anterior cerebral arteries are patent without significant proximal stenosis. Posterior circulation: The intracranial vertebral arteries are patent without significant stenosis, as is the basilar artery. The bilateral posterior cerebral arteries are patent without significant proximal stenosis. A sizable right posterior communicating artery is present. The a left posterior communicating artery is  poorly delineated and may be hypoplastic or absent. Venous sinuses: Within limitations of contrast timing, no convincing thrombus. Anatomic variants: As described Review of the MIP images confirms the above findings CT Brain Perfusion Findings: ASPECTS: 4 CBF (<30%) Volume: 78m (in the right MCA vascular territory) Perfusion (Tmax>6.0s) volume: 147 mL (in the right MCA vascular territory) Mismatch Volume: 1356mInfarction Location:Right MCA vascular territory Findings of a proximal M1 right MCA occlusion and the perfusion results were discussed by telephone at the time of interpretation on 04/17/2019 at 10:38 am to provider Dr. ArLorraine Laxwho verbally acknowledged these results. IMPRESSION: CTA neck: 1. The common and internal carotid arteries are patent within the neck bilaterally. 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 There is likely greater than 70% stenosis at the origin of the right ICA. 30-40% stenosis of the proximal left ICA. 2. The vertebral arteries are patent within the neck bilaterally. The right vertebral artery is dominant with moderate atherosclerotic narrowing at its origin. Duplicated V1 left vertebral artery. There is at least moderate ostial stenosis at the origin of the medial left V1 vertebral artery branch. 3. The partially imaged lung apices demonstrate findings suspicious for interstitial lung disease. Additionally, there are nodular opacities within the right lung apex measuring up to 1.4 cm. Dedicated chest CT is recommended for further evaluation when clinically feasible. CTA head: 1. 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 There is minimal reconstitution of flow within M2 and more distal right MCA branch vessels. 2. No other intracranial large vessel occlusion or proximal high-grade arterial stenosis identified. CT perfusion head: The perfusion software identifies a 10 mL core infarct within the right MCA vascular territory. However, please note  more extensive infarction changes were appreciated on concurrently performed noncontrast head CT. The perfusion software detects a 147 mL region of hypoperfused parenchyma within the right MCA vascular territory. Reported mismatch 137 mL. Electronically Signed   By: KyKellie SimmeringO   On: 04/17/2019 11:04  MR BRAIN WO CONTRAST  Result Date: 04/17/2019 CLINICAL DATA:  Focal neuro deficit, greater than 6 hours, stroke suspected. EXAM: MRI HEAD WITHOUT CONTRAST TECHNIQUE: Multiplanar, multiecho pulse sequences of the brain and surrounding structures were obtained without intravenous contrast. COMPARISON:  Noncontrast CT head, CT angiogram head/neck and CT perfusion performed earlier the same day 04/17/2019 FINDINGS: Brain: A limited protocol brain MRI was performed at the ordering provider's request consisting of only axial diffusion-weighted imaging and axial T2 FLAIR imaging. There is extensive multifocal restricted diffusion within the right MCA vascular territory, consistent with acute ischemic changes and involving portions of the right frontal, parietal, temporal and occipital lobes as well as right insular cortex, posterior limb of right internal capsule, right lentiform nucleus, right caudate body and right corona radiata. Of note, this includes acute ischemic change within the right motor strip. The largest region of diffusion weighted signal abnormality within the right temporal lobe measures 7.8 x 3.0 cm in transaxial dimensions (series 4, image 23). There is T2/FLAIR hyperintensity which corresponds with some, but not all, of the acute ischemic change. For instance, there is little to no T2/FLAIR hyperintensity corresponding with the ischemic changes within the right corona radiata, basal ganglia and right insula at this time. Punctate acute cortical infarct within the left parietooccipital lobe (series 4, image 33). Additional probable punctate acute infarcts more superiorly within the left parietal  lobe (series 4, image 35) and within the lateral left occipital lobe (series 4, image 24). Vascular: Poorly assessed on the acquired sequences. Skull and upper cervical spine: No evidence of focal marrow lesion on the acquired sequences. Sinuses/Orbits: Visualized orbits demonstrate no acute abnormality. Minimal ethmoid sinus mucosal thickening. No significant mastoid effusion. IMPRESSION: Extensive restricted diffusion consistent with acute ischemia involving the right MCA vascular territory. There is T2/FLAIR hyperintensity which corresponds with some, but not all, of the acute ischemic changes as described. Additional punctate acute infarcts within the left parietal and occipital lobes. Electronically Signed   By: Kellie Simmering DO   On: 04/17/2019 12:50   IR CT Head Ltd  Result Date: 04/17/2019 INDICATION: New onset of left-sided weakness with right gaze deviation and dysarthria.  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  EXAM: 1. EMERGENT LARGE VESSEL OCCLUSION THROMBOLYSIS (anterior CIRCULATION)  COMPARISON:  CT angiogram of the head and neck of April 17, 2019.  MEDICATIONS: Ancef 2 g IV antibiotic was administered within 1 hour of the procedure.  ANESTHESIA/SEDATION: General anesthesia.  CONTRAST:  Isovue 300 approximately 65 mL.  FLUOROSCOPY TIME:  Fluoroscopy Time: 41 minutes 18 seconds (1531 mGy).  COMPLICATIONS: None immediate.  TECHNIQUE: Following a full explanation of the procedure along with the potential associated complications, an informed witnessed consent was obtained from the patient's wife and daughter. The risks of intracranial hemorrhage of 10%, worsening neurological deficit, ventilator dependency, death and inability to revascularize were all reviewed in detail with the patient's wife and daughter.  The patient was then put under general anesthesia by the Department of Anesthesiology at [facility].  The right groin was prepped  and draped in the usual sterile fashion. Thereafter using modified Seldinger technique, transfemoral access into the right common femoral artery was obtained without difficulty. Over a 0.035 inch guidewire an 8 [place] Pinnacle sheath was inserted. Through this, and also over a 0.035 inch guidewire a 5 [place] JB 1 catheter was advanced to the aortic arch region and selectively positioned in the innominate artery and the right common  carotid artery.  FINDINGS: The innominate artery arteriogram demonstrates the origin of the right subclavian artery and the right common carotid artery to be widely patent.  The right common carotid arteriogram demonstrates the right external carotid artery and its major branches to be widely patent.  The right internal carotid artery at the bulb to the cranial skull base demonstrates wide patency with mild FMD-like changes involving the mid cervical right ICA.  The petrous, cavernous and the supraclinoid segments are widely patent.  A right posterior communicating artery is seen opacifying the right posterior cerebral distribution.  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 The right anterior cerebral artery opacifies into the capillary and venous phases.  PROCEDURE: The diagnostic JB 1 catheter in right common carotid artery was exchanged over an 8 [place] 300 cm Rosen exchange guidewire for an 087 balloon guide catheter which had been prepped with 50% contrast and 50% heparinized saline infusion. This was advanced to the right common carotid artery just proximal to the origin of the right internal carotid artery. The guidewire was removed. Good aspiration was obtained from the hub of the balloon guide catheter.  Gentle contrast injection demonstrated no evidence of spasms or of intraluminal filling defects. Over a 0.014 inch standard Synchro micro guidewire with a J configuration, the combination of the 021 Trevo ProVue microcatheter inside of  a 6 [place] Catalyst 132 cm guide catheter was advanced to the distal end of the balloon guide catheter.  With the micro guidewire leading with a J-tip configuration to avoid dissections or inducing spasm, the combination was navigated without difficulty to the supraclinoid right ICA.  The occluded right middle cerebral artery was entered with a micro guidewire which was advanced to the M3 regions of the inferior division of the right middle cerebral artery followed by the microcatheter. The guidewire was removed. Good aspiration was obtained from the hub of the microcatheter. Gentle contrast injection demonstrated safe position of tip of the microcatheter. This was then connected to continuous heparinized saline infusion.  A 4 mm x 40 mm Solitaire X retrieval device was then advanced to the distal end of the microcatheter. The O ring on the delivery microcatheter was then loosened. With slight forward gentle traction with the right hand on the delivery micro guidewire with left hand the delivery microcatheter was retrieved unsheathing the retrieval device.  The Catalyst 6 [place] guide catheter was advanced to the occluded right middle cerebral artery. With proximal flow arrest obtained by inflating the balloon in the right internal carotid artery, and after constant aspiration using a Penumbra aspiration device at the hub of the 6 [place] Catalyst guide catheter, and with a 60 mL syringe at the hub of the balloon guide catheter, the combination of the retrieval device, the microcatheter, and the 6 [place] Catalyst guide were retrieved and removed as constant aspiration was applied at the hub of the balloon guide catheter.  Proximal flow arrest was reversed. A control arteriogram performed through the balloon guide catheter which had been advanced into the mid right internal carotid artery demonstrated complete opacification of the right middle cerebral artery and the inferior division. However, the superior  division remained occluded.  A second pass was then made again using the above combination. With the micro guidewire leading, access into the M3 region of the superior division was obtained with a microcatheter. The micro guidewire was removed. Good aspiration was established at the hub of the microcatheter. A gentle control arteriogram  again demonstrates safe positioning of the tip of the microcatheter which was then connected to continuous heparinized saline infusion. Again with constant flow arrest in the right internal carotid artery, and aspiration at the hub of the Catalyst guide catheter using the Penumbra aspiration device at the origin of the superior division for 2 minutes, and with a 60 mL syringe at the hub of the balloon guide catheter, the combination of the retrieval device, the microcatheter and the 6 [place] Catalyst guide catheter were retrieved and removed. Following reversal of flow arrest, control arteriogram performed through the balloon guide catheter in the right internal carotid artery now demonstrated revascularization of the superior division and also further improved caliber of the inferior division.  There continued to be a very small branch extending into the anterior temporal region which was occluded in the M2 region. Also noted was a distal M3 M4 junction branch occlusion of a parietal branch of the inferior division.  A TICI 2C revascularization had been achieved.  The right anterior cerebral artery remained widely patent with cross-filling via the anterior communicating artery of the left anterior cerebral A2 segment and distally.  The posterior communicating artery remain widely patent.  Moderate spasm in the distal cervical right ICA, and the superior division of the right middle cerebral artery responded to 2 aliquots of 25 mcg of nitroglycerin intra-arterially.  A final control arteriogram performed through the balloon guide catheter in the right common carotid artery  demonstrated significantly improved caliber of the right internal carotid artery, with maintenance of a TICI 2c revascularization of the right middle cerebral artery distribution.  The balloon guide catheter was removed. The right groin 8 [place] Pinnacle sheath was then removed with manual compression in the right groin. Distal pulses remained Dopplerable in the dorsalis pedis, and the posterior tibial regions bilaterally unchanged.  A flat panel CT of the brain demonstrated heterogeneous hyper attenuation in the right temporal lobe region anteriorly probably representing contrast stain versus less likely hemorrhagic conversion of infarct tissue.  There was no mass-effect noted on the right lateral ventricle. Patient was then transferred to the ICU unit for post thrombectomy management. Patient was left intubated on account of his COVID status.  IMPRESSION: Endovascular near complete revascularization of the right middle cerebral artery territory with 2 passes with the 4 mm x 40 mm Solitaire X retrieval device and Penumbra aspiration achieving a TICI 2c revascularization.  PLAN: Follow-up as per referring MD.   Electronically Signed   By: Luanne Bras M.D.   On: 04/18/2019 10:42   CT CEREBRAL PERFUSION W CONTRAST  Result Date: 04/17/2019 CLINICAL DATA:  Stroke, follow-up. EXAM: CT ANGIOGRAPHY HEAD AND NECK CT PERFUSION BRAIN TECHNIQUE: Multidetector CT imaging of the head and neck was performed using the standard protocol during bolus administration of intravenous contrast. Multiplanar CT image reconstructions and MIPs were obtained to evaluate the vascular anatomy. Carotid stenosis measurements (when applicable) are obtained utilizing NASCET criteria, using the distal internal carotid diameter as the denominator. Multiphase CT imaging of the brain was performed following IV bolus contrast injection. Subsequent parametric perfusion maps were calculated using RAPID software. CONTRAST:  152m  OMNIPAQUE IOHEXOL 350 MG/ML SOLN COMPARISON:  Noncontrast head CT performed earlier the same day. FINDINGS: CTA NECK FINDINGS Aortic arch: The origins of the innominate and left common carotid arteries are incompletely imaged. The visualized aortic arch is unremarkable. No significant innominate or proximal subclavian stenosis visualized. Right carotid system: CCA patent to the bifurcation without stenosis. Prominent predominantly  calcified plaque within the distal common carotid artery and proximal ICA. There is likely greater than 70% stenosis at the origin of the ICA, although exact quantification of stenosis is difficult due to irregularity of calcified plaque. Distal to this the ICA is patent within the neck without stenosis. Left carotid system: CCA patent to the bifurcation without stenosis. Calcified plaque within the distal common carotid artery, carotid bifurcation and proximal ICA. Resultant 30-40% narrowing of the proximal ICA as compared to the more distal vessel. Distal to this the ICA is patent within the neck without stenosis. Vertebral arteries: Right vertebral artery dominant. 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 Calcified plaque at the origin of the medial V1 left vertebral artery branch with at least moderate ostial stenosis. More distally the bilateral vertebral arteries are patent within the neck without stenosis. Skeleton: No acute bony abnormality. Cervical spondylosis with C5-C6 and C6-C7 posterior disc osteophytes. Other neck: No neck mass or cervical adenopathy. Upper chest: Paraseptal emphysema within the imaged lung apices. Also within the imaged lung apices but there is interstitial prominence suspicious for interstitial lung disease. There are more nodular opacities within the right upper lobe measuring up to 1.4 cm (series 7, image 56) (series 7, image 99). Review of the MIP images confirms the above findings CTA HEAD FINDINGS Anterior circulation: The intracranial internal  carotid arteries are patent with scattered calcified plaque. No more than mild stenosis within these vessels. 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 There is minimal reconstitution of flow seen within M2 and more distal right MCA branch vessels. The left middle cerebral artery is patent without high-grade proximal stenosis. The bilateral anterior cerebral arteries are patent without significant proximal stenosis. Posterior circulation: The intracranial vertebral arteries are patent without significant stenosis, as is the basilar artery. The bilateral posterior cerebral arteries are patent without significant proximal stenosis. A sizable right posterior communicating artery is present. The a left posterior communicating artery is poorly delineated and may be hypoplastic or absent. Venous sinuses: Within limitations of contrast timing, no convincing thrombus. Anatomic variants: As described Review of the MIP images confirms the above findings CT Brain Perfusion Findings: ASPECTS: 4 CBF (<30%) Volume: 12m (in the right MCA vascular territory) Perfusion (Tmax>6.0s) volume: 147 mL (in the right MCA vascular territory) Mismatch Volume: 1367mInfarction Location:Right MCA vascular territory Findings of a proximal M1 right MCA occlusion and the perfusion results were discussed by telephone at the time of interpretation on 04/17/2019 at 10:38 am to provider Dr. ArLorraine Laxwho verbally acknowledged these results. IMPRESSION: CTA neck: 1. The common and internal carotid arteries are patent within the neck bilaterally. 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 There is likely greater than 70% stenosis at the origin of the right ICA. 30-40% stenosis of the proximal left ICA. 2. The vertebral arteries are patent within the neck bilaterally. The right vertebral artery is dominant with moderate atherosclerotic narrowing at its origin. Duplicated V1 left vertebral artery. There is at least  moderate ostial stenosis at the origin of the medial left V1 vertebral artery branch. 3. The partially imaged lung apices demonstrate findings suspicious for interstitial lung disease. Additionally, there are nodular opacities within the right lung apex measuring up to 1.4 cm. Dedicated chest CT is recommended for further evaluation when clinically feasible. CTA head: 1. 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 There is minimal reconstitution of flow within M2 and more distal right MCA branch vessels. 2. No other  intracranial large vessel occlusion or proximal high-grade arterial stenosis identified. CT perfusion head: The perfusion software identifies a 10 mL core infarct within the right MCA vascular territory. However, please note more extensive infarction changes were appreciated on concurrently performed noncontrast head CT. The perfusion software detects a 147 mL region of hypoperfused parenchyma within the right MCA vascular territory. Reported mismatch 137 mL. Electronically Signed   By: Kellie Simmering DO   On: 04/17/2019 11:04   DG Chest Port 1 View  Result Date: 04/18/2019 CLINICAL DATA:  Acute respiratory failure EXAM: PORTABLE CHEST 1 VIEW COMPARISON:  Radiograph 04/17/2019 FINDINGS: *Endotracheal tube in the mid trachea, 5.5 cm from the carina. *Telemetry leads overlie the chest. Slight improvement in the bilateral airspace disease most pronounced in the left base and periphery and right mid lung. No new areas of airspace disease. No pneumothorax or visible effusion the portion of the costophrenic sulci on the left is collimated. Cardiomediastinal contours are stable with a calcified aorta. No acute osseous or soft tissue abnormality. IMPRESSION: Stable satisfactory positioning of the endotracheal tube. Slight interval improvement of bilateral airspace disease. Electronically Signed   By: Lovena Le M.D.   On: 04/18/2019 06:15   DG Chest Port 1 View  Result Date:  04/17/2019 CLINICAL DATA:  COVID-19. Intubation. EXAM: PORTABLE CHEST 1 VIEW COMPARISON:  Radiograph yesterday. FINDINGS: Endotracheal tube tip 5.8 cm from the carina. Probable esophageal temperature probe with tip at the level of the clavicular heads. No definite enteric tube is visualized. Patchy bilateral airspace disease within both lungs, slight worsening in the right midlung zone from prior. Unchanged heart size and mediastinal contours. No pneumothorax or large pleural effusion. No acute osseous abnormalities are seen. IMPRESSION: 1. Bilateral pneumonia, with slight worsening in the right mid lung since yesterday. 2. Endotracheal tube tip 5.8 cm from the carina. Probable esophageal temperature probe with tip at the clavicular heads. Electronically Signed   By: Keith Rake M.D.   On: 04/17/2019 16:45   IR PERCUTANEOUS ART THROMBECTOMY/INFUSION INTRACRANIAL INC DIAG ANGIO  Result Date: 04/19/2019 INDICATION: New onset of left-sided weakness with right gaze deviation and dysarthria. 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 EXAM: 1. EMERGENT LARGE VESSEL OCCLUSION THROMBOLYSIS (anterior CIRCULATION) COMPARISON:  CT angiogram of the head and neck of April 17, 2019. MEDICATIONS: Ancef 2 g IV antibiotic was administered within 1 hour of the procedure. ANESTHESIA/SEDATION: General anesthesia. CONTRAST:  Isovue 300 approximately 65 mL. FLUOROSCOPY TIME:  Fluoroscopy Time: 41 minutes 18 seconds (1531 mGy). COMPLICATIONS: None immediate. TECHNIQUE: Following a full explanation of the procedure along with the potential associated complications, an informed witnessed consent was obtained from the patient's wife and daughter. The risks of intracranial hemorrhage of 10%, worsening neurological deficit, ventilator dependency, death and inability to revascularize were all reviewed in detail with the patient's wife and daughter. The patient was then put under general  anesthesia by the Department of Anesthesiology at [facility]. The right groin was prepped and draped in the usual sterile fashion. Thereafter using modified Seldinger technique, transfemoral access into the right common femoral artery was obtained without difficulty. Over a 0.035 inch guidewire an 8 [place] Pinnacle sheath was inserted. Through this, and also over a 0.035 inch guidewire a 5 [place] JB 1 catheter was advanced to the aortic arch region and selectively positioned in the innominate artery and the right common carotid artery. FINDINGS: The innominate artery arteriogram demonstrates the origin of the right subclavian artery and  the right common carotid artery to be widely patent. The right common carotid arteriogram demonstrates the right external carotid artery and its major branches to be widely patent. The right internal carotid artery at the bulb to the cranial skull base demonstrates wide patency with mild FMD-like changes involving the mid cervical right ICA. The petrous, cavernous and the supraclinoid segments are widely patent. A right posterior communicating artery is seen opacifying the right posterior cerebral distribution. 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 The right anterior cerebral artery opacifies into the capillary and venous phases. PROCEDURE: The diagnostic JB 1 catheter in right common carotid artery was exchanged over an 8 [place] 300 cm Rosen exchange guidewire for an 087 balloon guide catheter which had been prepped with 50% contrast and 50% heparinized saline infusion. This was advanced to the right common carotid artery just proximal to the origin of the right internal carotid artery. The guidewire was removed. Good aspiration was obtained from the hub of the balloon guide catheter. Gentle contrast injection demonstrated no evidence of spasms or of intraluminal filling defects. Over a 0.014 inch standard Synchro micro guidewire with  a J configuration, the combination of the 021 Trevo ProVue microcatheter inside of a 6 [place] Catalyst 132 cm guide catheter was advanced to the distal end of the balloon guide catheter. With the micro guidewire leading with a J-tip configuration to avoid dissections or inducing spasm, the combination was navigated without difficulty to the supraclinoid right ICA. The occluded right middle cerebral artery was entered with a micro guidewire which was advanced to the M3 regions of the inferior division of the right middle cerebral artery followed by the microcatheter. The guidewire was removed. Good aspiration was obtained from the hub of the microcatheter. Gentle contrast injection demonstrated safe position of tip of the microcatheter. This was then connected to continuous heparinized saline infusion. A 4 mm x 40 mm Solitaire X retrieval device was then advanced to the distal end of the microcatheter. The O ring on the delivery microcatheter was then loosened. With slight forward gentle traction with the right hand on the delivery micro guidewire with left hand the delivery microcatheter was retrieved unsheathing the retrieval device. The Catalyst 6 [place] guide catheter was advanced to the occluded right middle cerebral artery. With proximal flow arrest obtained by inflating the balloon in the right internal carotid artery, and after constant aspiration using a Penumbra aspiration device at the hub of the 6 [place] Catalyst guide catheter, and with a 60 mL syringe at the hub of the balloon guide catheter, the combination of the retrieval device, the microcatheter, and the 6 [place] Catalyst guide were retrieved and removed as constant aspiration was applied at the hub of the balloon guide catheter. Proximal flow arrest was reversed. A control arteriogram performed through the balloon guide catheter which had been advanced into the mid right internal carotid artery demonstrated complete opacification of the right  middle cerebral artery and the inferior division. However, the superior division remained occluded. A second pass was then made again using the above combination. With the micro guidewire leading, access into the M3 region of the superior division was obtained with a microcatheter. The micro guidewire was removed. Good aspiration was established at the hub of the microcatheter. A gentle control arteriogram again demonstrates safe positioning of the tip of the microcatheter which was then connected to continuous heparinized saline infusion. Again with constant flow arrest in the right internal carotid artery,  and aspiration at the hub of the Catalyst guide catheter using the Penumbra aspiration device at the origin of the superior division for 2 minutes, and with a 60 mL syringe at the hub of the balloon guide catheter, the combination of the retrieval device, the microcatheter and the 6 [place] Catalyst guide catheter were retrieved and removed. Following reversal of flow arrest, control arteriogram performed through the balloon guide catheter in the right internal carotid artery now demonstrated revascularization of the superior division and also further improved caliber of the inferior division. There continued to be a very small branch extending into the anterior temporal region which was occluded in the M2 region. Also noted was a distal M3 M4 junction branch occlusion of a parietal branch of the inferior division. A TICI 2C revascularization had been achieved. The right anterior cerebral artery remained widely patent with cross-filling via the anterior communicating artery of the left anterior cerebral A2 segment and distally. The posterior communicating artery remain widely patent. Moderate spasm in the distal cervical right ICA, and the superior division of the right middle cerebral artery responded to 2 aliquots of 25 mcg of nitroglycerin intra-arterially. A final control arteriogram performed through the  balloon guide catheter in the right common carotid artery demonstrated significantly improved caliber of the right internal carotid artery, with maintenance of a TICI 2c revascularization of the right middle cerebral artery distribution. The balloon guide catheter was removed. The right groin 8 [place] Pinnacle sheath was then removed with manual compression in the right groin. Distal pulses remained Dopplerable in the dorsalis pedis, and the posterior tibial regions bilaterally unchanged. A flat panel CT of the brain demonstrated heterogeneous hyper attenuation in the right temporal lobe region anteriorly probably representing contrast stain versus less likely hemorrhagic conversion of infarct tissue. There was no mass-effect noted on the right lateral ventricle. Patient was then transferred to the ICU unit for post thrombectomy management. Patient was left intubated on account of his COVID status. IMPRESSION: Endovascular near complete revascularization of the right middle cerebral artery territory with 2 passes with the 4 mm x 40 mm Solitaire X retrieval device and Penumbra aspiration achieving a TICI 2c revascularization. PLAN: Follow-up as per referring MD. Electronically Signed   By: Luanne Bras M.D.   On: 04/18/2019 10:42   CT HEAD CODE STROKE WO CONTRAST  Result Date: 04/17/2019 CLINICAL DATA:  Code stroke. Left-sided facial droop/weakness, last known well noon yesterday EXAM: CT HEAD WITHOUT CONTRAST TECHNIQUE: Contiguous axial images were obtained from the base of the skull through the vertex without intravenous contrast. COMPARISON:  No pertinent prior studies available for comparison. 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 Subtle petechial hemorrhage is questioned within  a region of infarction within the anterior right frontal lobe (series 2, image 19). No significant mass effect or midline shift. No extra-axial fluid collection. Background mild ill-defined hypoattenuation within the cerebral white matter is nonspecific, but consistent with chronic small vessel ischemic disease. Mild generalized parenchymal atrophy. Vascular: Abnormal hyperdensity of the M1 right MCA likely reflecting thrombus. Atherosclerotic calcifications. Skull: No calvarial fracture or suspicious osseous lesion. Sinuses/Orbits: Visualized orbits demonstrate no acute abnormality. Mild paranasal sinus mucosal thickening. No significant mastoid effusion. ASPECTS [facility] Stroke Program Early CT Score) - Ganglionic level infarction (caudate, lentiform nuclei, internal capsule, insula, M1-M3 cortex): 4 (points  subtracted for M2, M3 and insula) - Supraganglionic infarction (M4-M6 cortex): 0 Total score (0-10 with 10 being normal): 4 These results were communicated to Dr. Lorraine Lax At 10:38 amon 2/10/2021by text page via the [facility] messaging system. IMPRESSION: 1. 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 ASPECTS 4. Subtle petechial hemorrhage is questioned within an anterior right frontal lobe infarct site. No significant mass effect. 2. Hyperdensity of the M1 right MCA likely reflecting thrombus. 3. Mild generalized parenchymal atrophy and chronic small vessel ischemic disease. Electronically Signed   By: Kellie Simmering DO   On: 04/17/2019 10:38   VAS [place] LOWER EXTREMITY VENOUS (DVT)  Result Date: 04/18/2019  Lower Venous DVTStudy Indications: Elevated Ddimer.  Risk Factors: COVID 19 positive. Limitations: Bandages, open wound and patient positioning. Comparison Study: No prior studies. Performing Technologist: Oliver Hum RVT  Examination Guidelines: A complete evaluation includes B-mode imaging, spectral Doppler, color Doppler, and power Doppler as needed of all  accessible portions of each vessel. Bilateral testing is considered an integral part of a complete examination. Limited examinations for reoccurring indications may be performed as noted. The reflux portion of the exam is performed with the patient in reverse Trendelenburg.  +---------+---------------+---------+-----------+----------+--------------+ RIGHT    CompressibilityPhasicitySpontaneityPropertiesThrombus Aging +---------+---------------+---------+-----------+----------+--------------+ CFV                                                   Not visualized +---------+---------------+---------+-----------+----------+--------------+ SFJ                                                   Not visualized +---------+---------------+---------+-----------+----------+--------------+ FV Prox  Full           Yes      Yes                                 +---------+---------------+---------+-----------+----------+--------------+ FV Mid   Full                                                        +---------+---------------+---------+-----------+----------+--------------+ FV DistalFull                                                        +---------+---------------+---------+-----------+----------+--------------+ PFV      Full                                                        +---------+---------------+---------+-----------+----------+--------------+ POP      Full           Yes      Yes                                 +---------+---------------+---------+-----------+----------+--------------+  PTV      Full                                                        +---------+---------------+---------+-----------+----------+--------------+ PERO     Full                                                        +---------+---------------+---------+-----------+----------+--------------+ Unable to visualize the CFV, SFJ due to bandages.   +---------+---------------+---------+-----------+----------+--------------+ LEFT     CompressibilityPhasicitySpontaneityPropertiesThrombus Aging +---------+---------------+---------+-----------+----------+--------------+ CFV      Full           Yes      Yes                                 +---------+---------------+---------+-----------+----------+--------------+ SFJ      Full                                                        +---------+---------------+---------+-----------+----------+--------------+ FV Prox  Full                                                        +---------+---------------+---------+-----------+----------+--------------+ FV Mid   Full                                                        +---------+---------------+---------+-----------+----------+--------------+ FV DistalFull                                                        +---------+---------------+---------+-----------+----------+--------------+ PFV      Full                                                        +---------+---------------+---------+-----------+----------+--------------+ POP      Full           Yes      Yes                                 +---------+---------------+---------+-----------+----------+--------------+ PTV      Full                                                        +---------+---------------+---------+-----------+----------+--------------+  PERO     Full                                                        +---------+---------------+---------+-----------+----------+--------------+ Gastroc  None                                         Acute          +---------+---------------+---------+-----------+----------+--------------+     Summary: RIGHT: - There is no evidence of deep vein thrombosis in the lower extremity.  - No cystic structure found in the popliteal fossa.  LEFT: - Findings consistent with acute deep vein  thrombosis involving the left gastrocnemius veins. - No cystic structure found in the popliteal fossa.  *See table(s) above for measurements and observations. Electronically signed by Servando Snare MD on 04/18/2019 at 6:33:22 PM.    Final    ECHOCARDIOGRAM LIMITED  Result Date: 04/18/2019    ECHOCARDIOGRAM LIMITED REPORT   Patient Name:   Henry [name] Date of Exam: 04/17/2019 Medical Rec #:  [id number]       Height:       68.0 in Accession #:    [id number]      Weight:       180.0 lb Date of Birth:  1940/09/22       BSA:          1.95 m Patient Age:    24 years        BP:           141/51 mmHg Patient Gender: M               HR:           57 bpm. Exam Location:  Inpatient Procedure: Limited Echo and Cardiac Doppler Indications:    Stroke 434.91 / I163.9  History:        Patient has no prior history of Echocardiogram examinations.                 Signs/Symptoms:Chest Pain; Risk Factors:Hypertension. Pneumonia                 due to COVID-19 virus.  Sonographer:    Jaquita Folds Referring Phys: [place]  1. Left ventricular ejection fraction, by estimation, is 55 to 60%. The left ventricle has normal function. The left ventrical has no regional wall motion abnormalities. Left ventricular diastolic parameters are indeterminate.  2. Right ventricular systolic function is normal. The right ventricular size is normal. There is normal pulmonary artery systolic pressure.  3. The mitral valve is normal in structure and function. trivial mitral valve regurgitation. No evidence of mitral stenosis.  4. The aortic valve is normal in structure and function. Aortic valve regurgitation is not visualized. No aortic stenosis is present. FINDINGS  Left Ventricle: Left ventricular ejection fraction, by estimation, is 55 to 60%. The left ventricle has normal function. The left ventricle has no regional wall motion abnormalities. There is no left ventricular hypertrophy. Right Ventricle: The right  ventricular size is normal. No increase in right ventricular wall thickness. Right ventricular systolic function is normal. There is normal pulmonary artery systolic pressure. The tricuspid regurgitant velocity is 1.86 m/s, and  with an assumed right atrial pressure of 3 mmHg, the estimated right ventricular systolic pressure is 11.6 mmHg. Mitral Valve: The mitral valve is normal in structure and function. Trivial mitral valve regurgitation. No evidence of mitral valve stenosis. Tricuspid Valve: The tricuspid valve is grossly normal. Tricuspid valve regurgitation is mild. Aortic Valve: The aortic valve is normal in structure and function. Aortic valve regurgitation is not visualized. No aortic stenosis is present. Aorta: The aortic root, ascending aorta and aortic arch are all structurally normal, with no evidence of dilitation or obstruction.  LEFT VENTRICLE PLAX 2D LVIDd:         4.60 cm  Diastology LVIDs:         3.60 cm  LV e' lateral:   8.04 cm/s LV PW:         0.90 cm  LV E/e' lateral: 10.1 LV IVS:        0.90 cm  LV e' medial:    5.91 cm/s LVOT diam:     2.00 cm  LV E/e' medial:  13.8 LV SV:         55.61 ml LV SV Index:   21.50 LVOT Area:     3.14 cm  LEFT ATRIUM             Index LA diam:        4.30 cm 2.20 cm/m LA Vol (A2C):   50.7 ml 25.94 ml/m LA Vol (A4C):   43.8 ml 22.41 ml/m LA Biplane Vol: 48.7 ml 24.92 ml/m  AORTIC VALVE LVOT Vmax:   79.40 cm/s LVOT Vmean:  57.900 cm/s LVOT VTI:    0.177 m  AORTA Ao Root diam: 2.70 cm MITRAL VALVE                        TRICUSPID VALVE MV Area (PHT): 2.50 cm             TR Peak grad:   13.8 mmHg MV Decel Time: 303 msec             TR Vmax:        186.00 cm/s MV E velocity: 81.60 cm/s 103 cm/s MV A velocity: 74.40 cm/s 70.3 cm/s SHUNTS MV E/A ratio:  1.10       1.5       Systemic VTI:  0.18 m                                     Systemic Diam: 2.00 cm Mertie Moores MD Electronically signed by Mertie Moores MD Signature Date/Time: 04/18/2019/5:07:36 PM    Final      Assessment & Plan:    Walker Kehr, MD

## 2019-05-30 NOTE — Telephone Encounter (Signed)
Ok Thx 

## 2019-05-30 NOTE — Telephone Encounter (Signed)
Left message for Henry [name] with verbal okay today.

## 2019-06-03 DIAGNOSIS — R1312 Dysphagia, oropharyngeal phase: Secondary | ICD-10-CM | POA: Diagnosis not present

## 2019-06-03 DIAGNOSIS — H5462 Unqualified visual loss, left eye, normal vision right eye: Secondary | ICD-10-CM | POA: Diagnosis not present

## 2019-06-03 DIAGNOSIS — I69391 Dysphagia following cerebral infarction: Secondary | ICD-10-CM | POA: Diagnosis not present

## 2019-06-03 DIAGNOSIS — G8929 Other chronic pain: Secondary | ICD-10-CM | POA: Diagnosis not present

## 2019-06-03 DIAGNOSIS — M1991 Primary osteoarthritis, unspecified site: Secondary | ICD-10-CM | POA: Diagnosis not present

## 2019-06-03 DIAGNOSIS — M545 Low back pain: Secondary | ICD-10-CM | POA: Diagnosis not present

## 2019-06-03 DIAGNOSIS — I69354 Hemiplegia and hemiparesis following cerebral infarction affecting left non-dominant side: Secondary | ICD-10-CM | POA: Diagnosis not present

## 2019-06-03 DIAGNOSIS — J9601 Acute respiratory failure with hypoxia: Secondary | ICD-10-CM | POA: Diagnosis not present

## 2019-06-03 DIAGNOSIS — D751 Secondary polycythemia: Secondary | ICD-10-CM | POA: Diagnosis not present

## 2019-06-04 DIAGNOSIS — G8929 Other chronic pain: Secondary | ICD-10-CM | POA: Diagnosis not present

## 2019-06-04 DIAGNOSIS — M1991 Primary osteoarthritis, unspecified site: Secondary | ICD-10-CM | POA: Diagnosis not present

## 2019-06-04 DIAGNOSIS — D751 Secondary polycythemia: Secondary | ICD-10-CM | POA: Diagnosis not present

## 2019-06-04 DIAGNOSIS — R1312 Dysphagia, oropharyngeal phase: Secondary | ICD-10-CM | POA: Diagnosis not present

## 2019-06-04 DIAGNOSIS — I69354 Hemiplegia and hemiparesis following cerebral infarction affecting left non-dominant side: Secondary | ICD-10-CM | POA: Diagnosis not present

## 2019-06-04 DIAGNOSIS — J9601 Acute respiratory failure with hypoxia: Secondary | ICD-10-CM | POA: Diagnosis not present

## 2019-06-04 DIAGNOSIS — M545 Low back pain: Secondary | ICD-10-CM | POA: Diagnosis not present

## 2019-06-04 DIAGNOSIS — H5462 Unqualified visual loss, left eye, normal vision right eye: Secondary | ICD-10-CM | POA: Diagnosis not present

## 2019-06-04 DIAGNOSIS — I69391 Dysphagia following cerebral infarction: Secondary | ICD-10-CM | POA: Diagnosis not present

## 2019-06-05 DIAGNOSIS — D751 Secondary polycythemia: Secondary | ICD-10-CM | POA: Diagnosis not present

## 2019-06-05 DIAGNOSIS — J9601 Acute respiratory failure with hypoxia: Secondary | ICD-10-CM | POA: Diagnosis not present

## 2019-06-05 DIAGNOSIS — H5462 Unqualified visual loss, left eye, normal vision right eye: Secondary | ICD-10-CM | POA: Diagnosis not present

## 2019-06-05 DIAGNOSIS — R1312 Dysphagia, oropharyngeal phase: Secondary | ICD-10-CM | POA: Diagnosis not present

## 2019-06-05 DIAGNOSIS — M545 Low back pain: Secondary | ICD-10-CM | POA: Diagnosis not present

## 2019-06-05 DIAGNOSIS — G8929 Other chronic pain: Secondary | ICD-10-CM | POA: Diagnosis not present

## 2019-06-05 DIAGNOSIS — I69354 Hemiplegia and hemiparesis following cerebral infarction affecting left non-dominant side: Secondary | ICD-10-CM | POA: Diagnosis not present

## 2019-06-05 DIAGNOSIS — I69391 Dysphagia following cerebral infarction: Secondary | ICD-10-CM | POA: Diagnosis not present

## 2019-06-05 DIAGNOSIS — M1991 Primary osteoarthritis, unspecified site: Secondary | ICD-10-CM | POA: Diagnosis not present

## 2019-06-06 DIAGNOSIS — G8929 Other chronic pain: Secondary | ICD-10-CM | POA: Diagnosis not present

## 2019-06-06 DIAGNOSIS — H5462 Unqualified visual loss, left eye, normal vision right eye: Secondary | ICD-10-CM | POA: Diagnosis not present

## 2019-06-06 DIAGNOSIS — M1991 Primary osteoarthritis, unspecified site: Secondary | ICD-10-CM | POA: Diagnosis not present

## 2019-06-06 DIAGNOSIS — I69391 Dysphagia following cerebral infarction: Secondary | ICD-10-CM | POA: Diagnosis not present

## 2019-06-06 DIAGNOSIS — D751 Secondary polycythemia: Secondary | ICD-10-CM | POA: Diagnosis not present

## 2019-06-06 DIAGNOSIS — J9601 Acute respiratory failure with hypoxia: Secondary | ICD-10-CM | POA: Diagnosis not present

## 2019-06-06 DIAGNOSIS — M545 Low back pain: Secondary | ICD-10-CM | POA: Diagnosis not present

## 2019-06-06 DIAGNOSIS — R1312 Dysphagia, oropharyngeal phase: Secondary | ICD-10-CM | POA: Diagnosis not present

## 2019-06-06 DIAGNOSIS — I69354 Hemiplegia and hemiparesis following cerebral infarction affecting left non-dominant side: Secondary | ICD-10-CM | POA: Diagnosis not present

## 2019-06-10 DIAGNOSIS — D751 Secondary polycythemia: Secondary | ICD-10-CM | POA: Diagnosis not present

## 2019-06-10 DIAGNOSIS — I69354 Hemiplegia and hemiparesis following cerebral infarction affecting left non-dominant side: Secondary | ICD-10-CM | POA: Diagnosis not present

## 2019-06-10 DIAGNOSIS — I69391 Dysphagia following cerebral infarction: Secondary | ICD-10-CM | POA: Diagnosis not present

## 2019-06-10 DIAGNOSIS — M545 Low back pain: Secondary | ICD-10-CM | POA: Diagnosis not present

## 2019-06-10 DIAGNOSIS — R1312 Dysphagia, oropharyngeal phase: Secondary | ICD-10-CM | POA: Diagnosis not present

## 2019-06-10 DIAGNOSIS — G8929 Other chronic pain: Secondary | ICD-10-CM | POA: Diagnosis not present

## 2019-06-10 DIAGNOSIS — M1991 Primary osteoarthritis, unspecified site: Secondary | ICD-10-CM | POA: Diagnosis not present

## 2019-06-10 DIAGNOSIS — H5462 Unqualified visual loss, left eye, normal vision right eye: Secondary | ICD-10-CM | POA: Diagnosis not present

## 2019-06-10 DIAGNOSIS — J9601 Acute respiratory failure with hypoxia: Secondary | ICD-10-CM | POA: Diagnosis not present

## 2019-06-11 ENCOUNTER — Inpatient Hospital Stay: Payer: Medicare HMO | Admitting: Neurology

## 2019-06-11 DIAGNOSIS — G8929 Other chronic pain: Secondary | ICD-10-CM | POA: Diagnosis not present

## 2019-06-11 DIAGNOSIS — D751 Secondary polycythemia: Secondary | ICD-10-CM | POA: Diagnosis not present

## 2019-06-11 DIAGNOSIS — I69391 Dysphagia following cerebral infarction: Secondary | ICD-10-CM | POA: Diagnosis not present

## 2019-06-11 DIAGNOSIS — R1312 Dysphagia, oropharyngeal phase: Secondary | ICD-10-CM | POA: Diagnosis not present

## 2019-06-11 DIAGNOSIS — J9601 Acute respiratory failure with hypoxia: Secondary | ICD-10-CM | POA: Diagnosis not present

## 2019-06-11 DIAGNOSIS — I69354 Hemiplegia and hemiparesis following cerebral infarction affecting left non-dominant side: Secondary | ICD-10-CM | POA: Diagnosis not present

## 2019-06-11 DIAGNOSIS — M1991 Primary osteoarthritis, unspecified site: Secondary | ICD-10-CM | POA: Diagnosis not present

## 2019-06-11 DIAGNOSIS — H5462 Unqualified visual loss, left eye, normal vision right eye: Secondary | ICD-10-CM | POA: Diagnosis not present

## 2019-06-11 DIAGNOSIS — M545 Low back pain: Secondary | ICD-10-CM | POA: Diagnosis not present

## 2019-06-12 ENCOUNTER — Telehealth: Payer: Self-pay | Admitting: Internal Medicine

## 2019-06-12 DIAGNOSIS — M1991 Primary osteoarthritis, unspecified site: Secondary | ICD-10-CM | POA: Diagnosis not present

## 2019-06-12 DIAGNOSIS — D751 Secondary polycythemia: Secondary | ICD-10-CM | POA: Diagnosis not present

## 2019-06-12 DIAGNOSIS — I69391 Dysphagia following cerebral infarction: Secondary | ICD-10-CM | POA: Diagnosis not present

## 2019-06-12 DIAGNOSIS — I69354 Hemiplegia and hemiparesis following cerebral infarction affecting left non-dominant side: Secondary | ICD-10-CM | POA: Diagnosis not present

## 2019-06-12 DIAGNOSIS — G8929 Other chronic pain: Secondary | ICD-10-CM | POA: Diagnosis not present

## 2019-06-12 DIAGNOSIS — M545 Low back pain: Secondary | ICD-10-CM | POA: Diagnosis not present

## 2019-06-12 DIAGNOSIS — H5462 Unqualified visual loss, left eye, normal vision right eye: Secondary | ICD-10-CM | POA: Diagnosis not present

## 2019-06-12 DIAGNOSIS — J9601 Acute respiratory failure with hypoxia: Secondary | ICD-10-CM | POA: Diagnosis not present

## 2019-06-12 DIAGNOSIS — R1312 Dysphagia, oropharyngeal phase: Secondary | ICD-10-CM | POA: Diagnosis not present

## 2019-06-12 NOTE — Telephone Encounter (Signed)
    FYI [facility] calling to report patient had a fall today, no injury

## 2019-06-13 DIAGNOSIS — M545 Low back pain: Secondary | ICD-10-CM | POA: Diagnosis not present

## 2019-06-13 DIAGNOSIS — J9601 Acute respiratory failure with hypoxia: Secondary | ICD-10-CM | POA: Diagnosis not present

## 2019-06-13 DIAGNOSIS — G8929 Other chronic pain: Secondary | ICD-10-CM | POA: Diagnosis not present

## 2019-06-13 DIAGNOSIS — M1991 Primary osteoarthritis, unspecified site: Secondary | ICD-10-CM | POA: Diagnosis not present

## 2019-06-13 DIAGNOSIS — D751 Secondary polycythemia: Secondary | ICD-10-CM | POA: Diagnosis not present

## 2019-06-13 DIAGNOSIS — I69354 Hemiplegia and hemiparesis following cerebral infarction affecting left non-dominant side: Secondary | ICD-10-CM | POA: Diagnosis not present

## 2019-06-13 DIAGNOSIS — I69391 Dysphagia following cerebral infarction: Secondary | ICD-10-CM | POA: Diagnosis not present

## 2019-06-13 DIAGNOSIS — R1312 Dysphagia, oropharyngeal phase: Secondary | ICD-10-CM | POA: Diagnosis not present

## 2019-06-13 DIAGNOSIS — H5462 Unqualified visual loss, left eye, normal vision right eye: Secondary | ICD-10-CM | POA: Diagnosis not present

## 2019-06-17 DIAGNOSIS — M1991 Primary osteoarthritis, unspecified site: Secondary | ICD-10-CM | POA: Diagnosis not present

## 2019-06-17 DIAGNOSIS — I69391 Dysphagia following cerebral infarction: Secondary | ICD-10-CM | POA: Diagnosis not present

## 2019-06-17 DIAGNOSIS — M545 Low back pain: Secondary | ICD-10-CM | POA: Diagnosis not present

## 2019-06-17 DIAGNOSIS — R1312 Dysphagia, oropharyngeal phase: Secondary | ICD-10-CM | POA: Diagnosis not present

## 2019-06-17 DIAGNOSIS — G8929 Other chronic pain: Secondary | ICD-10-CM | POA: Diagnosis not present

## 2019-06-17 DIAGNOSIS — D751 Secondary polycythemia: Secondary | ICD-10-CM | POA: Diagnosis not present

## 2019-06-17 DIAGNOSIS — H5462 Unqualified visual loss, left eye, normal vision right eye: Secondary | ICD-10-CM | POA: Diagnosis not present

## 2019-06-17 DIAGNOSIS — I69354 Hemiplegia and hemiparesis following cerebral infarction affecting left non-dominant side: Secondary | ICD-10-CM | POA: Diagnosis not present

## 2019-06-17 DIAGNOSIS — J9601 Acute respiratory failure with hypoxia: Secondary | ICD-10-CM | POA: Diagnosis not present

## 2019-06-19 DIAGNOSIS — D751 Secondary polycythemia: Secondary | ICD-10-CM | POA: Diagnosis not present

## 2019-06-19 DIAGNOSIS — I69354 Hemiplegia and hemiparesis following cerebral infarction affecting left non-dominant side: Secondary | ICD-10-CM | POA: Diagnosis not present

## 2019-06-19 DIAGNOSIS — I69391 Dysphagia following cerebral infarction: Secondary | ICD-10-CM | POA: Diagnosis not present

## 2019-06-19 DIAGNOSIS — M1991 Primary osteoarthritis, unspecified site: Secondary | ICD-10-CM | POA: Diagnosis not present

## 2019-06-19 DIAGNOSIS — G8929 Other chronic pain: Secondary | ICD-10-CM | POA: Diagnosis not present

## 2019-06-19 DIAGNOSIS — R1312 Dysphagia, oropharyngeal phase: Secondary | ICD-10-CM | POA: Diagnosis not present

## 2019-06-19 DIAGNOSIS — M545 Low back pain: Secondary | ICD-10-CM | POA: Diagnosis not present

## 2019-06-19 DIAGNOSIS — J9601 Acute respiratory failure with hypoxia: Secondary | ICD-10-CM | POA: Diagnosis not present

## 2019-06-19 DIAGNOSIS — H5462 Unqualified visual loss, left eye, normal vision right eye: Secondary | ICD-10-CM | POA: Diagnosis not present

## 2019-06-20 ENCOUNTER — Telehealth: Payer: Self-pay | Admitting: Internal Medicine

## 2019-06-20 NOTE — Telephone Encounter (Signed)
FYI

## 2019-06-20 NOTE — Telephone Encounter (Signed)
New message:  FYI:  Margaretha Sheffield is calling from [facility] and just wanted to let the Dr know that the pt missed an appt today. She also states that the daughter has requested for no further sessions be done.

## 2019-06-24 DIAGNOSIS — I69391 Dysphagia following cerebral infarction: Secondary | ICD-10-CM | POA: Diagnosis not present

## 2019-06-24 DIAGNOSIS — R1312 Dysphagia, oropharyngeal phase: Secondary | ICD-10-CM | POA: Diagnosis not present

## 2019-06-24 DIAGNOSIS — G8929 Other chronic pain: Secondary | ICD-10-CM | POA: Diagnosis not present

## 2019-06-24 DIAGNOSIS — J9601 Acute respiratory failure with hypoxia: Secondary | ICD-10-CM | POA: Diagnosis not present

## 2019-06-24 DIAGNOSIS — I69354 Hemiplegia and hemiparesis following cerebral infarction affecting left non-dominant side: Secondary | ICD-10-CM | POA: Diagnosis not present

## 2019-06-24 DIAGNOSIS — M545 Low back pain: Secondary | ICD-10-CM | POA: Diagnosis not present

## 2019-06-24 DIAGNOSIS — H5462 Unqualified visual loss, left eye, normal vision right eye: Secondary | ICD-10-CM | POA: Diagnosis not present

## 2019-06-24 DIAGNOSIS — M1991 Primary osteoarthritis, unspecified site: Secondary | ICD-10-CM | POA: Diagnosis not present

## 2019-06-24 DIAGNOSIS — D751 Secondary polycythemia: Secondary | ICD-10-CM | POA: Diagnosis not present

## 2019-06-25 NOTE — Telephone Encounter (Signed)
   Mark from [facility] , requesting verbal order for re-eval of gait 1x week for 1 week  Phone [phone]

## 2019-06-25 NOTE — Telephone Encounter (Signed)
Verbals given, FYI 

## 2019-06-26 DIAGNOSIS — M545 Low back pain: Secondary | ICD-10-CM | POA: Diagnosis not present

## 2019-06-26 DIAGNOSIS — I69391 Dysphagia following cerebral infarction: Secondary | ICD-10-CM | POA: Diagnosis not present

## 2019-06-26 DIAGNOSIS — I69354 Hemiplegia and hemiparesis following cerebral infarction affecting left non-dominant side: Secondary | ICD-10-CM | POA: Diagnosis not present

## 2019-06-26 DIAGNOSIS — H5462 Unqualified visual loss, left eye, normal vision right eye: Secondary | ICD-10-CM | POA: Diagnosis not present

## 2019-06-26 DIAGNOSIS — M1991 Primary osteoarthritis, unspecified site: Secondary | ICD-10-CM | POA: Diagnosis not present

## 2019-06-26 DIAGNOSIS — D751 Secondary polycythemia: Secondary | ICD-10-CM | POA: Diagnosis not present

## 2019-06-26 DIAGNOSIS — R1312 Dysphagia, oropharyngeal phase: Secondary | ICD-10-CM | POA: Diagnosis not present

## 2019-06-26 DIAGNOSIS — J9601 Acute respiratory failure with hypoxia: Secondary | ICD-10-CM | POA: Diagnosis not present

## 2019-06-26 DIAGNOSIS — G8929 Other chronic pain: Secondary | ICD-10-CM | POA: Diagnosis not present

## 2019-06-27 ENCOUNTER — Ambulatory Visit (INDEPENDENT_AMBULATORY_CARE_PROVIDER_SITE_OTHER): Payer: Medicare HMO | Admitting: Internal Medicine

## 2019-06-27 ENCOUNTER — Encounter: Payer: Self-pay | Admitting: Internal Medicine

## 2019-06-27 ENCOUNTER — Other Ambulatory Visit: Payer: Self-pay

## 2019-06-27 DIAGNOSIS — R42 Dizziness and giddiness: Secondary | ICD-10-CM

## 2019-06-27 DIAGNOSIS — D751 Secondary polycythemia: Secondary | ICD-10-CM

## 2019-06-27 DIAGNOSIS — I10 Essential (primary) hypertension: Secondary | ICD-10-CM | POA: Diagnosis not present

## 2019-06-27 DIAGNOSIS — I69391 Dysphagia following cerebral infarction: Secondary | ICD-10-CM | POA: Diagnosis not present

## 2019-06-27 DIAGNOSIS — I6601 Occlusion and stenosis of right middle cerebral artery: Secondary | ICD-10-CM | POA: Diagnosis not present

## 2019-06-27 DIAGNOSIS — E785 Hyperlipidemia, unspecified: Secondary | ICD-10-CM

## 2019-06-27 DIAGNOSIS — R1312 Dysphagia, oropharyngeal phase: Secondary | ICD-10-CM | POA: Diagnosis not present

## 2019-06-27 DIAGNOSIS — G8929 Other chronic pain: Secondary | ICD-10-CM | POA: Diagnosis not present

## 2019-06-27 DIAGNOSIS — H5462 Unqualified visual loss, left eye, normal vision right eye: Secondary | ICD-10-CM | POA: Diagnosis not present

## 2019-06-27 DIAGNOSIS — M545 Low back pain: Secondary | ICD-10-CM | POA: Diagnosis not present

## 2019-06-27 DIAGNOSIS — J9601 Acute respiratory failure with hypoxia: Secondary | ICD-10-CM | POA: Diagnosis not present

## 2019-06-27 DIAGNOSIS — I69354 Hemiplegia and hemiparesis following cerebral infarction affecting left non-dominant side: Secondary | ICD-10-CM | POA: Diagnosis not present

## 2019-06-27 DIAGNOSIS — M1991 Primary osteoarthritis, unspecified site: Secondary | ICD-10-CM | POA: Diagnosis not present

## 2019-06-27 MED ORDER — METHYLPREDNISOLONE ACETATE 80 MG/ML IJ SUSP
80.0000 mg | Freq: Once | INTRAMUSCULAR | Status: AC
  Administered 2019-06-27: 80 mg via INTRAMUSCULAR

## 2019-06-27 MED ORDER — MECLIZINE HCL 12.5 MG PO TABS: 12.5000 mg | ORAL_TABLET | Freq: Three times a day (TID) | ORAL | 1 refills | Status: DC | PRN

## 2019-06-27 NOTE — Assessment & Plan Note (Addendum)
In PT/OT at home B complex

## 2019-06-27 NOTE — Assessment & Plan Note (Signed)
Lipitor 

## 2019-06-27 NOTE — Patient Instructions (Signed)
Drink more water Get up slowly 

## 2019-06-27 NOTE — Assessment & Plan Note (Signed)
CBC

## 2019-06-27 NOTE — Assessment & Plan Note (Addendum)
Diltiazem Drink more water Get up slowly

## 2019-06-27 NOTE — Addendum Note (Signed)
Addended by: Karren Cobble on: 06/27/2019 10:40 AM   Modules accepted: Orders

## 2019-06-27 NOTE — Progress Notes (Signed)
Subjective:  Patient ID: Henry [name], male    DOB: 01-Feb-1941  Age: 79 y.o. MRN: [id number]  CC: No chief complaint on file.   HPI Henry [name] presents for severe vertigo - fell yesterday w/wife  - head was spinning. It has started a week ago BP drop  Outpatient Medications Prior to Visit  Medication Sig Dispense Refill  . apixaban (ELIQUIS) 5 MG TABS tablet Take 1 tablet (5 mg total) by mouth 2 (two) times daily. 60 tablet 11  . atorvastatin (LIPITOR) 40 MG tablet Take 1 tablet (40 mg total) by mouth daily at 6 PM. 30 tablet 11  . Cholecalciferol (VITAMIN D3) 50 MCG (2000 UT) capsule Take 1 capsule (2,000 Units total) by mouth daily. 100 capsule 3  . diltiazem (CARDIZEM) 60 MG tablet Take 1 tablet (60 mg total) by mouth 2 (two) times daily. 60 tablet 11  . Misc Natural Products (PROSTATE HEALTH) CAPS Take 1 capsule by mouth daily.    . Probiotic Product (ALIGN PO) Take 1 capsule by mouth daily.    . Multiple Vitamins-Minerals (SUPER MEGA VITE 75/BETA CARO PO) Take 1 tablet by mouth 2 (two) times daily.     No facility-administered medications prior to visit.    ROS: Review of Systems  Constitutional: Negative for appetite change, fatigue and unexpected weight change.  HENT: Negative for congestion, nosebleeds, sneezing, sore throat and trouble swallowing.   Eyes: Negative for itching and visual disturbance.  Respiratory: Negative for cough.   Cardiovascular: Negative for chest pain, palpitations and leg swelling.  Gastrointestinal: Negative for abdominal distention, blood in stool, diarrhea and nausea.  Genitourinary: Negative for frequency and hematuria.  Musculoskeletal: Positive for gait problem. Negative for back pain, joint swelling and neck pain.  Skin: Negative for rash.  Neurological: Positive for dizziness and weakness. Negative for tremors, speech difficulty and light-headedness.  Psychiatric/Behavioral: Negative for agitation, dysphoric mood and sleep  disturbance. The patient is not nervous/anxious.     Objective:  BP 106/74 (BP Location: Left Arm, Patient Position: Standing, Cuff Size: Normal)   Pulse 77   Temp 98 F (36.7 C) (Oral)   Ht 5\' 8"  (1.727 m)   Wt 153 lb (69.4 kg)   SpO2 96%   BMI 23.26 kg/m   BP Readings from Last 3 Encounters:  06/27/19 106/74  05/29/19 118/74  05/17/19 120/82    Wt Readings from Last 3 Encounters:  06/27/19 153 lb (69.4 kg)  05/29/19 156 lb 8 oz (71 kg)  05/17/19 156 lb (70.8 kg)    Physical Exam Constitutional:      General: He is not in acute distress.    Appearance: He is well-developed.     Comments: NAD  Eyes:     Conjunctiva/sclera: Conjunctivae normal.     Pupils: Pupils are equal, round, and reactive to light.  Neck:     Thyroid: No thyromegaly.     Vascular: No JVD.  Cardiovascular:     Rate and Rhythm: Normal rate and regular rhythm.     Heart sounds: Normal heart sounds. No murmur. No friction rub. No gallop.   Pulmonary:     Effort: Pulmonary effort is normal. No respiratory distress.     Breath sounds: Normal breath sounds. No wheezing or rales.  Chest:     Chest wall: No tenderness.  Abdominal:     General: Bowel sounds are normal. There is no distension.     Palpations: Abdomen is soft. There is no mass.  Tenderness: There is no abdominal tenderness. There is no guarding or rebound.  Musculoskeletal:        General: No tenderness. Normal range of motion.     Cervical back: Normal range of motion.  Lymphadenopathy:     Cervical: No cervical adenopathy.  Skin:    General: Skin is warm and dry.     Findings: No rash.  Neurological:     Mental Status: He is alert and oriented to person, place, and time.     Cranial Nerves: No cranial nerve deficit.     Motor: Weakness present. No abnormal muscle tone.     Coordination: Coordination abnormal.     Gait: Gait abnormal.     Deep Tendon Reflexes: Reflexes are normal and symmetric.  Psychiatric:         Behavior: Behavior normal.        Thought Content: Thought content normal.        Judgment: Judgment normal.   walker  Lab Results  Component Value Date   WBC 6.4 05/29/2019   HGB 13.0 05/29/2019   HCT 38.0 (L) 05/29/2019   PLT 258.0 05/29/2019   GLUCOSE 114 (H) 05/29/2019   CHOL 93 04/18/2019   TRIG 108 04/25/2019   HDL 19 (L) 04/18/2019   LDLDIRECT 135.0 02/24/2017   LDLCALC 53 04/18/2019   ALT 33 05/29/2019   AST 31 05/29/2019   NA 139 05/29/2019   K 4.4 05/29/2019   CL 102 05/29/2019   CREATININE 0.74 05/29/2019   BUN 14 05/29/2019   CO2 31 05/29/2019   TSH 1.26 05/29/2019   PSA 1.75 02/24/2017   INR 1.2 04/17/2019   HGBA1C 6.6 (H) 05/29/2019    CT ANGIO HEAD W OR WO CONTRAST  Result Date: 04/17/2019 CLINICAL DATA:  Stroke, follow-up. EXAM: CT ANGIOGRAPHY HEAD AND NECK CT PERFUSION BRAIN TECHNIQUE: Multidetector CT imaging of the head and neck was performed using the standard protocol during bolus administration of intravenous contrast. Multiplanar CT image reconstructions and MIPs were obtained to evaluate the vascular anatomy. Carotid stenosis measurements (when applicable) are obtained utilizing NASCET criteria, using the distal internal carotid diameter as the denominator. Multiphase CT imaging of the brain was performed following IV bolus contrast injection. Subsequent parametric perfusion maps were calculated using RAPID software. CONTRAST:  145mL OMNIPAQUE IOHEXOL 350 MG/ML SOLN COMPARISON:  Noncontrast head CT performed earlier the same day. FINDINGS: CTA NECK FINDINGS Aortic arch: The origins of the innominate and left common carotid arteries are incompletely imaged. The visualized aortic arch is unremarkable. No significant innominate or proximal subclavian stenosis visualized. Right carotid system: CCA patent to the bifurcation without stenosis. Prominent predominantly calcified plaque within the distal common carotid artery and proximal ICA. There is likely greater  than 70% stenosis at the origin of the ICA, although exact quantification of stenosis is difficult due to irregularity of calcified plaque. Distal to this the ICA is patent within the neck without stenosis. Left carotid system: CCA patent to the bifurcation without stenosis. Calcified plaque within the distal common carotid artery, carotid bifurcation and proximal ICA. Resultant 30-40% narrowing of the proximal ICA as compared to the more distal vessel. Distal to this the ICA is patent within the neck without stenosis. Vertebral arteries: Right vertebral artery dominant. 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 Calcified plaque at the origin of the medial V1 left vertebral artery branch with at least moderate ostial stenosis. More distally the bilateral vertebral arteries are patent within the neck without stenosis.  Skeleton: No acute bony abnormality. Cervical spondylosis with C5-C6 and C6-C7 posterior disc osteophytes. Other neck: No neck mass or cervical adenopathy. Upper chest: Paraseptal emphysema within the imaged lung apices. Also within the imaged lung apices but there is interstitial prominence suspicious for interstitial lung disease. There are more nodular opacities within the right upper lobe measuring up to 1.4 cm (series 7, image 56) (series 7, image 99). Review of the MIP images confirms the above findings CTA HEAD FINDINGS Anterior circulation: The intracranial internal carotid arteries are patent with scattered calcified plaque. No more than mild stenosis within these vessels. 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 There is minimal reconstitution of flow seen within M2 and more distal right MCA branch vessels. The left middle cerebral artery is patent without high-grade proximal stenosis. The bilateral anterior cerebral arteries are patent without significant proximal stenosis. Posterior circulation: The intracranial vertebral arteries are patent without significant stenosis, as  is the basilar artery. The bilateral posterior cerebral arteries are patent without significant proximal stenosis. A sizable right posterior communicating artery is present. The a left posterior communicating artery is poorly delineated and may be hypoplastic or absent. Venous sinuses: Within limitations of contrast timing, no convincing thrombus. Anatomic variants: As described Review of the MIP images confirms the above findings CT Brain Perfusion Findings: ASPECTS: 4 CBF (<30%) Volume: 46mL (in the right MCA vascular territory) Perfusion (Tmax>6.0s) volume: 147 mL (in the right MCA vascular territory) Mismatch Volume: 146mL Infarction Location:Right MCA vascular territory Findings of a proximal M1 right MCA occlusion and the perfusion results were discussed by telephone at the time of interpretation on 04/17/2019 at 10:38 am to provider Dr. Lorraine Lax, who verbally acknowledged these results. IMPRESSION: CTA neck: 1. The common and internal carotid arteries are patent within the neck bilaterally. 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 There is likely greater than 70% stenosis at the origin of the right ICA. 30-40% stenosis of the proximal left ICA. 2. The vertebral arteries are patent within the neck bilaterally. The right vertebral artery is dominant with moderate atherosclerotic narrowing at its origin. Duplicated V1 left vertebral artery. There is at least moderate ostial stenosis at the origin of the medial left V1 vertebral artery branch. 3. The partially imaged lung apices demonstrate findings suspicious for interstitial lung disease. Additionally, there are nodular opacities within the right lung apex measuring up to 1.4 cm. Dedicated chest CT is recommended for further evaluation when clinically feasible. CTA head: 1. 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 There is minimal reconstitution of flow within M2 and more distal right MCA branch vessels. 2. No other  intracranial large vessel occlusion or proximal high-grade arterial stenosis identified. CT perfusion head: The perfusion software identifies a 10 mL core infarct within the right MCA vascular territory. However, please note more extensive infarction changes were appreciated on concurrently performed noncontrast head CT. The perfusion software detects a 147 mL region of hypoperfused parenchyma within the right MCA vascular territory. Reported mismatch 137 mL. Electronically Signed   By: Kellie Simmering DO   On: 04/17/2019 11:04   CT HEAD WO CONTRAST  Result Date: 04/17/2019 CLINICAL DATA:  Follow-up intervention for right MCA occlusion. EXAM: CT HEAD WITHOUT CONTRAST TECHNIQUE: Contiguous axial images were obtained from the base of the skull through the vertex without intravenous contrast. COMPARISON:  Interventional images same day.  Head CT same day. FINDINGS: Brain: In the region the small core infarction in the lateral  temporal lobe and the penumbra of the majority of the right middle cerebral artery cortical territory, there is brain swelling with effacement of the sulci and areas of patchy low-density as well as some areas of increased attenuation. Low-density present within the right basal ganglia. The appearance of the increased attenuation is more consistent with post procedural contrast staining than intraparenchymal hemorrhage. Hemorrhage is not excluded but not favored. Certainly, there is no frank hematoma. No evidence of midline shift. No hydrocephalus. No extra-axial collection. Vascular: No acute vascular finding primarily. Skull: Negative Sinuses/Orbits: Clear except for mild mucosal thickening. Other: None IMPRESSION: Ischemic changes in the right MCA territory affecting the cortical brain, subcortical brain and right basal ganglia. Areas of low-density as well as areas of indistinct higher density favored to represent contrast staining rather than frank hemorrhage. Low level petechial bleeding is  not excluded. There is no discrete hematoma. No mass effect or shift, despite mild-to-moderate swelling in the region of affected brain. Electronically Signed   By: Nelson Chimes M.D.   On: 04/17/2019 20:35   CT ANGIO NECK W OR WO CONTRAST  Result Date: 04/17/2019 CLINICAL DATA:  Stroke, follow-up. EXAM: CT ANGIOGRAPHY HEAD AND NECK CT PERFUSION BRAIN TECHNIQUE: Multidetector CT imaging of the head and neck was performed using the standard protocol during bolus administration of intravenous contrast. Multiplanar CT image reconstructions and MIPs were obtained to evaluate the vascular anatomy. Carotid stenosis measurements (when applicable) are obtained utilizing NASCET criteria, using the distal internal carotid diameter as the denominator. Multiphase CT imaging of the brain was performed following IV bolus contrast injection. Subsequent parametric perfusion maps were calculated using RAPID software. CONTRAST:  154mL OMNIPAQUE IOHEXOL 350 MG/ML SOLN COMPARISON:  Noncontrast head CT performed earlier the same day. FINDINGS: CTA NECK FINDINGS Aortic arch: The origins of the innominate and left common carotid arteries are incompletely imaged. The visualized aortic arch is unremarkable. No significant innominate or proximal subclavian stenosis visualized. Right carotid system: CCA patent to the bifurcation without stenosis. Prominent predominantly calcified plaque within the distal common carotid artery and proximal ICA. There is likely greater than 70% stenosis at the origin of the ICA, although exact quantification of stenosis is difficult due to irregularity of calcified plaque. Distal to this the ICA is patent within the neck without stenosis. Left carotid system: CCA patent to the bifurcation without stenosis. Calcified plaque within the distal common carotid artery, carotid bifurcation and proximal ICA. Resultant 30-40% narrowing of the proximal ICA as compared to the more distal vessel. Distal to this the ICA  is patent within the neck without stenosis. Vertebral arteries: Right vertebral artery dominant. 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 Calcified plaque at the origin of the medial V1 left vertebral artery branch with at least moderate ostial stenosis. More distally the bilateral vertebral arteries are patent within the neck without stenosis. Skeleton: No acute bony abnormality. Cervical spondylosis with C5-C6 and C6-C7 posterior disc osteophytes. Other neck: No neck mass or cervical adenopathy. Upper chest: Paraseptal emphysema within the imaged lung apices. Also within the imaged lung apices but there is interstitial prominence suspicious for interstitial lung disease. There are more nodular opacities within the right upper lobe measuring up to 1.4 cm (series 7, image 56) (series 7, image 99). Review of the MIP images confirms the above findings CTA HEAD FINDINGS Anterior circulation: The intracranial internal carotid arteries are patent with scattered calcified plaque. No more than mild stenosis within these vessels. The M1 right MCA is occluded shortly  beyond its origin. There is minimal reconstitution of flow seen within M2 and more distal right MCA branch vessels. The left middle cerebral artery is patent without high-grade proximal stenosis. The bilateral anterior cerebral arteries are patent without significant proximal stenosis. Posterior circulation: The intracranial vertebral arteries are patent without significant stenosis, as is the basilar artery. The bilateral posterior cerebral arteries are patent without significant proximal stenosis. A sizable right posterior communicating artery is present. The a left posterior communicating artery is poorly delineated and may be hypoplastic or absent. Venous sinuses: Within limitations of contrast timing, no convincing thrombus. Anatomic variants: As described Review of the MIP images confirms the above findings CT Brain Perfusion Findings: ASPECTS: 4 CBF  (<30%) Volume: 18mL (in the right MCA vascular territory) Perfusion (Tmax>6.0s) volume: 147 mL (in the right MCA vascular territory) Mismatch Volume: 148mL Infarction Location:Right MCA vascular territory Findings of a proximal M1 right MCA occlusion and the perfusion results were discussed by telephone at the time of interpretation on 04/17/2019 at 10:38 am to provider Dr. Lorraine Lax, who verbally acknowledged these results. IMPRESSION: CTA neck: 1. The common and internal carotid arteries are patent within the neck bilaterally. 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 There is likely greater than 70% stenosis at the origin of the right ICA. 30-40% stenosis of the proximal left ICA. 2. The vertebral arteries are patent within the neck bilaterally. The right vertebral artery is dominant with moderate atherosclerotic narrowing at its origin. Duplicated V1 left vertebral artery. There is at least moderate ostial stenosis at the origin of the medial left V1 vertebral artery branch. 3. The partially imaged lung apices demonstrate findings suspicious for interstitial lung disease. Additionally, there are nodular opacities within the right lung apex measuring up to 1.4 cm. Dedicated chest CT is recommended for further evaluation when clinically feasible. CTA head: 1. 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 There is minimal reconstitution of flow within M2 and more distal right MCA branch vessels. 2. No other intracranial large vessel occlusion or proximal high-grade arterial stenosis identified. CT perfusion head: The perfusion software identifies a 10 mL core infarct within the right MCA vascular territory. However, please note more extensive infarction changes were appreciated on concurrently performed noncontrast head CT. The perfusion software detects a 147 mL region of hypoperfused parenchyma within the right MCA vascular territory. Reported mismatch 137 mL. Electronically Signed    By: Kellie Simmering DO   On: 04/17/2019 11:04   MR BRAIN WO CONTRAST  Result Date: 04/17/2019 CLINICAL DATA:  Focal neuro deficit, greater than 6 hours, stroke suspected. EXAM: MRI HEAD WITHOUT CONTRAST TECHNIQUE: Multiplanar, multiecho pulse sequences of the brain and surrounding structures were obtained without intravenous contrast. COMPARISON:  Noncontrast CT head, CT angiogram head/neck and CT perfusion performed earlier the same day 04/17/2019 FINDINGS: Brain: A limited protocol brain MRI was performed at the ordering provider's request consisting of only axial diffusion-weighted imaging and axial T2 FLAIR imaging. There is extensive multifocal restricted diffusion within the right MCA vascular territory, consistent with acute ischemic changes and involving portions of the right frontal, parietal, temporal and occipital lobes as well as right insular cortex, posterior limb of right internal capsule, right lentiform nucleus, right caudate body and right corona radiata. Of note, this includes acute ischemic change within the right motor strip. The largest region of diffusion weighted signal abnormality within the right temporal lobe measures 7.8 x 3.0 cm in transaxial dimensions (series 4, image 23). There is  T2/FLAIR hyperintensity which corresponds with some, but not all, of the acute ischemic change. For instance, there is little to no T2/FLAIR hyperintensity corresponding with the ischemic changes within the right corona radiata, basal ganglia and right insula at this time. Punctate acute cortical infarct within the left parietooccipital lobe (series 4, image 33). Additional probable punctate acute infarcts more superiorly within the left parietal lobe (series 4, image 35) and within the lateral left occipital lobe (series 4, image 24). Vascular: Poorly assessed on the acquired sequences. Skull and upper cervical spine: No evidence of focal marrow lesion on the acquired sequences. Sinuses/Orbits: Visualized  orbits demonstrate no acute abnormality. Minimal ethmoid sinus mucosal thickening. No significant mastoid effusion. IMPRESSION: Extensive restricted diffusion consistent with acute ischemia involving the right MCA vascular territory. There is T2/FLAIR hyperintensity which corresponds with some, but not all, of the acute ischemic changes as described. Additional punctate acute infarcts within the left parietal and occipital lobes. Electronically Signed   By: Kellie Simmering DO   On: 04/17/2019 12:50   IR CT Head Ltd  Result Date: 04/17/2019 INDICATION: New onset of left-sided weakness with right gaze deviation and dysarthria.  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  EXAM: 1. EMERGENT LARGE VESSEL OCCLUSION THROMBOLYSIS (anterior CIRCULATION)  COMPARISON:  CT angiogram of the head and neck of April 17, 2019.  MEDICATIONS: Ancef 2 g IV antibiotic was administered within 1 hour of the procedure.  ANESTHESIA/SEDATION: General anesthesia.  CONTRAST:  Isovue 300 approximately 65 mL.  FLUOROSCOPY TIME:  Fluoroscopy Time: 41 minutes 18 seconds (1531 mGy).  COMPLICATIONS: None immediate.  TECHNIQUE: Following a full explanation of the procedure along with the potential associated complications, an informed witnessed consent was obtained from the patient's wife and daughter. The risks of intracranial hemorrhage of 10%, worsening neurological deficit, ventilator dependency, death and inability to revascularize were all reviewed in detail with the patient's wife and daughter.  The patient was then put under general anesthesia by the Department of Anesthesiology at [facility].  The right groin was prepped and draped in the usual sterile fashion. Thereafter using modified Seldinger technique, transfemoral access into the right common femoral artery was obtained without difficulty. Over a 0.035 inch guidewire an 8 [place] Pinnacle sheath was inserted. Through this,  and also over a 0.035 inch guidewire a 5 [place] JB 1 catheter was advanced to the aortic arch region and selectively positioned in the innominate artery and the right common carotid artery.  FINDINGS: The innominate artery arteriogram demonstrates the origin of the right subclavian artery and the right common carotid artery to be widely patent.  The right common carotid arteriogram demonstrates the right external carotid artery and its major branches to be widely patent.  The right internal carotid artery at the bulb to the cranial skull base demonstrates wide patency with mild FMD-like changes involving the mid cervical right ICA.  The petrous, cavernous and the supraclinoid segments are widely patent.  A right posterior communicating artery is seen opacifying the right posterior cerebral distribution.  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 The right anterior cerebral artery opacifies into the capillary and venous phases.  PROCEDURE: The diagnostic JB 1 catheter in right common carotid artery was exchanged over an 8 [place] 300 cm Rosen exchange guidewire for an 087 balloon guide catheter which had been prepped with 50% contrast and 50% heparinized saline infusion. This was advanced to the right common carotid  artery just proximal to the origin of the right internal carotid artery. The guidewire was removed. Good aspiration was obtained from the hub of the balloon guide catheter.  Gentle contrast injection demonstrated no evidence of spasms or of intraluminal filling defects. Over a 0.014 inch standard Synchro micro guidewire with a J configuration, the combination of the 021 Trevo ProVue microcatheter inside of a 6 [place] Catalyst 132 cm guide catheter was advanced to the distal end of the balloon guide catheter.  With the micro guidewire leading with a J-tip configuration to avoid dissections or inducing spasm, the combination was navigated without difficulty to the  supraclinoid right ICA.  The occluded right middle cerebral artery was entered with a micro guidewire which was advanced to the M3 regions of the inferior division of the right middle cerebral artery followed by the microcatheter. The guidewire was removed. Good aspiration was obtained from the hub of the microcatheter. Gentle contrast injection demonstrated safe position of tip of the microcatheter. This was then connected to continuous heparinized saline infusion.  A 4 mm x 40 mm Solitaire X retrieval device was then advanced to the distal end of the microcatheter. The O ring on the delivery microcatheter was then loosened. With slight forward gentle traction with the right hand on the delivery micro guidewire with left hand the delivery microcatheter was retrieved unsheathing the retrieval device.  The Catalyst 6 [place] guide catheter was advanced to the occluded right middle cerebral artery. With proximal flow arrest obtained by inflating the balloon in the right internal carotid artery, and after constant aspiration using a Penumbra aspiration device at the hub of the 6 [place] Catalyst guide catheter, and with a 60 mL syringe at the hub of the balloon guide catheter, the combination of the retrieval device, the microcatheter, and the 6 [place] Catalyst guide were retrieved and removed as constant aspiration was applied at the hub of the balloon guide catheter.  Proximal flow arrest was reversed. A control arteriogram performed through the balloon guide catheter which had been advanced into the mid right internal carotid artery demonstrated complete opacification of the right middle cerebral artery and the inferior division. However, the superior division remained occluded.  A second pass was then made again using the above combination. With the micro guidewire leading, access into the M3 region of the superior division was obtained with a microcatheter. The micro guidewire was removed. Good aspiration was  established at the hub of the microcatheter. A gentle control arteriogram again demonstrates safe positioning of the tip of the microcatheter which was then connected to continuous heparinized saline infusion. Again with constant flow arrest in the right internal carotid artery, and aspiration at the hub of the Catalyst guide catheter using the Penumbra aspiration device at the origin of the superior division for 2 minutes, and with a 60 mL syringe at the hub of the balloon guide catheter, the combination of the retrieval device, the microcatheter and the 6 [place] Catalyst guide catheter were retrieved and removed. Following reversal of flow arrest, control arteriogram performed through the balloon guide catheter in the right internal carotid artery now demonstrated revascularization of the superior division and also further improved caliber of the inferior division.  There continued to be a very small branch extending into the anterior temporal region which was occluded in the M2 region. Also noted was a distal M3 M4 junction branch occlusion of a parietal branch of the inferior division.  A TICI 2C revascularization had been achieved.  The  right anterior cerebral artery remained widely patent with cross-filling via the anterior communicating artery of the left anterior cerebral A2 segment and distally.  The posterior communicating artery remain widely patent.  Moderate spasm in the distal cervical right ICA, and the superior division of the right middle cerebral artery responded to 2 aliquots of 25 mcg of nitroglycerin intra-arterially.  A final control arteriogram performed through the balloon guide catheter in the right common carotid artery demonstrated significantly improved caliber of the right internal carotid artery, with maintenance of a TICI 2c revascularization of the right middle cerebral artery distribution.  The balloon guide catheter was removed. The right groin 8 French Pinnacle sheath was  then removed with manual compression in the right groin. Distal pulses remained Dopplerable in the dorsalis pedis, and the posterior tibial regions bilaterally unchanged.  A flat panel CT of the brain demonstrated heterogeneous hyper attenuation in the right temporal lobe region anteriorly probably representing contrast stain versus less likely hemorrhagic conversion of infarct tissue.  There was no mass-effect noted on the right lateral ventricle. Patient was then transferred to the ICU unit for post thrombectomy management. Patient was left intubated on account of his COVID status.  IMPRESSION: Endovascular near complete revascularization of the right middle cerebral artery territory with 2 passes with the 4 mm x 40 mm Solitaire X retrieval device and Penumbra aspiration achieving a TICI 2c revascularization.  PLAN: Follow-up as per referring MD.   Electronically Signed   By: Luanne Bras M.D.   On: 04/18/2019 10:42   CT CEREBRAL PERFUSION W CONTRAST  Result Date: 04/17/2019 CLINICAL DATA:  Stroke, follow-up. EXAM: CT ANGIOGRAPHY HEAD AND NECK CT PERFUSION BRAIN TECHNIQUE: Multidetector CT imaging of the head and neck was performed using the standard protocol during bolus administration of intravenous contrast. Multiplanar CT image reconstructions and MIPs were obtained to evaluate the vascular anatomy. Carotid stenosis measurements (when applicable) are obtained utilizing NASCET criteria, using the distal internal carotid diameter as the denominator. Multiphase CT imaging of the brain was performed following IV bolus contrast injection. Subsequent parametric perfusion maps were calculated using RAPID software. CONTRAST:  116mL OMNIPAQUE IOHEXOL 350 MG/ML SOLN COMPARISON:  Noncontrast head CT performed earlier the same day. FINDINGS: CTA NECK FINDINGS Aortic arch: The origins of the innominate and left common carotid arteries are incompletely imaged. The visualized aortic arch is unremarkable. No  significant innominate or proximal subclavian stenosis visualized. Right carotid system: CCA patent to the bifurcation without stenosis. Prominent predominantly calcified plaque within the distal common carotid artery and proximal ICA. There is likely greater than 70% stenosis at the origin of the ICA, although exact quantification of stenosis is difficult due to irregularity of calcified plaque. Distal to this the ICA is patent within the neck without stenosis. Left carotid system: CCA patent to the bifurcation without stenosis. Calcified plaque within the distal common carotid artery, carotid bifurcation and proximal ICA. Resultant 30-40% narrowing of the proximal ICA as compared to the more distal vessel. Distal to this the ICA is patent within the neck without stenosis. Vertebral arteries: Right vertebral artery dominant. 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 Calcified plaque at the origin of the medial V1 left vertebral artery branch with at least moderate ostial stenosis. More distally the bilateral vertebral arteries are patent within the neck without stenosis. Skeleton: No acute bony abnormality. Cervical spondylosis with C5-C6 and C6-C7 posterior disc osteophytes. Other neck: No neck mass or cervical adenopathy. Upper chest: Paraseptal emphysema within the imaged lung  apices. Also within the imaged lung apices but there is interstitial prominence suspicious for interstitial lung disease. There are more nodular opacities within the right upper lobe measuring up to 1.4 cm (series 7, image 56) (series 7, image 99). Review of the MIP images confirms the above findings CTA HEAD FINDINGS Anterior circulation: The intracranial internal carotid arteries are patent with scattered calcified plaque. No more than mild stenosis within these vessels. 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 There is minimal reconstitution of flow seen within M2 and more distal right MCA branch vessels. The left  middle cerebral artery is patent without high-grade proximal stenosis. The bilateral anterior cerebral arteries are patent without significant proximal stenosis. Posterior circulation: The intracranial vertebral arteries are patent without significant stenosis, as is the basilar artery. The bilateral posterior cerebral arteries are patent without significant proximal stenosis. A sizable right posterior communicating artery is present. The a left posterior communicating artery is poorly delineated and may be hypoplastic or absent. Venous sinuses: Within limitations of contrast timing, no convincing thrombus. Anatomic variants: As described Review of the MIP images confirms the above findings CT Brain Perfusion Findings: ASPECTS: 4 CBF (<30%) Volume: 58mL (in the right MCA vascular territory) Perfusion (Tmax>6.0s) volume: 147 mL (in the right MCA vascular territory) Mismatch Volume: 141mL Infarction Location:Right MCA vascular territory Findings of a proximal M1 right MCA occlusion and the perfusion results were discussed by telephone at the time of interpretation on 04/17/2019 at 10:38 am to provider Dr. Lorraine Lax, who verbally acknowledged these results. IMPRESSION: CTA neck: 1. The common and internal carotid arteries are patent within the neck bilaterally. 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 There is likely greater than 70% stenosis at the origin of the right ICA. 30-40% stenosis of the proximal left ICA. 2. The vertebral arteries are patent within the neck bilaterally. The right vertebral artery is dominant with moderate atherosclerotic narrowing at its origin. Duplicated V1 left vertebral artery. There is at least moderate ostial stenosis at the origin of the medial left V1 vertebral artery branch. 3. The partially imaged lung apices demonstrate findings suspicious for interstitial lung disease. Additionally, there are nodular opacities within the right lung apex measuring up to 1.4 cm.  Dedicated chest CT is recommended for further evaluation when clinically feasible. CTA head: 1. 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 There is minimal reconstitution of flow within M2 and more distal right MCA branch vessels. 2. No other intracranial large vessel occlusion or proximal high-grade arterial stenosis identified. CT perfusion head: The perfusion software identifies a 10 mL core infarct within the right MCA vascular territory. However, please note more extensive infarction changes were appreciated on concurrently performed noncontrast head CT. The perfusion software detects a 147 mL region of hypoperfused parenchyma within the right MCA vascular territory. Reported mismatch 137 mL. Electronically Signed   By: Kellie Simmering DO   On: 04/17/2019 11:04   DG Chest Port 1 View  Result Date: 04/18/2019 CLINICAL DATA:  Acute respiratory failure EXAM: PORTABLE CHEST 1 VIEW COMPARISON:  Radiograph 04/17/2019 FINDINGS: *Endotracheal tube in the mid trachea, 5.5 cm from the carina. *Telemetry leads overlie the chest. Slight improvement in the bilateral airspace disease most pronounced in the left base and periphery and right mid lung. No new areas of airspace disease. No pneumothorax or visible effusion the portion of the costophrenic sulci on the left is collimated. Cardiomediastinal contours are stable with a calcified aorta. No acute osseous  or soft tissue abnormality. IMPRESSION: Stable satisfactory positioning of the endotracheal tube. Slight interval improvement of bilateral airspace disease. Electronically Signed   By: Lovena Le M.D.   On: 04/18/2019 06:15   DG Chest Port 1 View  Result Date: 04/17/2019 CLINICAL DATA:  COVID-19. Intubation. EXAM: PORTABLE CHEST 1 VIEW COMPARISON:  Radiograph yesterday. FINDINGS: Endotracheal tube tip 5.8 cm from the carina. Probable esophageal temperature probe with tip at the level of the clavicular heads. No definite enteric tube is visualized.  Patchy bilateral airspace disease within both lungs, slight worsening in the right midlung zone from prior. Unchanged heart size and mediastinal contours. No pneumothorax or large pleural effusion. No acute osseous abnormalities are seen. IMPRESSION: 1. Bilateral pneumonia, with slight worsening in the right mid lung since yesterday. 2. Endotracheal tube tip 5.8 cm from the carina. Probable esophageal temperature probe with tip at the clavicular heads. Electronically Signed   By: Keith Rake M.D.   On: 04/17/2019 16:45   IR PERCUTANEOUS ART THROMBECTOMY/INFUSION INTRACRANIAL INC DIAG ANGIO  Result Date: 04/19/2019 INDICATION: New onset of left-sided weakness with right gaze deviation and dysarthria. 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 EXAM: 1. EMERGENT LARGE VESSEL OCCLUSION THROMBOLYSIS (anterior CIRCULATION) COMPARISON:  CT angiogram of the head and neck of April 17, 2019. MEDICATIONS: Ancef 2 g IV antibiotic was administered within 1 hour of the procedure. ANESTHESIA/SEDATION: General anesthesia. CONTRAST:  Isovue 300 approximately 65 mL. FLUOROSCOPY TIME:  Fluoroscopy Time: 41 minutes 18 seconds (1531 mGy). COMPLICATIONS: None immediate. TECHNIQUE: Following a full explanation of the procedure along with the potential associated complications, an informed witnessed consent was obtained from the patient's wife and daughter. The risks of intracranial hemorrhage of 10%, worsening neurological deficit, ventilator dependency, death and inability to revascularize were all reviewed in detail with the patient's wife and daughter. The patient was then put under general anesthesia by the Department of Anesthesiology at [facility]. The right groin was prepped and draped in the usual sterile fashion. Thereafter using modified Seldinger technique, transfemoral access into the right common femoral artery was obtained without difficulty. Over a 0.035 inch  guidewire an 8 [place] Pinnacle sheath was inserted. Through this, and also over a 0.035 inch guidewire a 5 [place] JB 1 catheter was advanced to the aortic arch region and selectively positioned in the innominate artery and the right common carotid artery. FINDINGS: The innominate artery arteriogram demonstrates the origin of the right subclavian artery and the right common carotid artery to be widely patent. The right common carotid arteriogram demonstrates the right external carotid artery and its major branches to be widely patent. The right internal carotid artery at the bulb to the cranial skull base demonstrates wide patency with mild FMD-like changes involving the mid cervical right ICA. The petrous, cavernous and the supraclinoid segments are widely patent. A right posterior communicating artery is seen opacifying the right posterior cerebral distribution. 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 The right anterior cerebral artery opacifies into the capillary and venous phases. PROCEDURE: The diagnostic JB 1 catheter in right common carotid artery was exchanged over an 8 [place] 300 cm Rosen exchange guidewire for an 087 balloon guide catheter which had been prepped with 50% contrast and 50% heparinized saline infusion. This was advanced to the right common carotid artery just proximal to the origin of the right internal carotid artery. The guidewire was removed. Good aspiration was obtained from the hub  of the balloon guide catheter. Gentle contrast injection demonstrated no evidence of spasms or of intraluminal filling defects. Over a 0.014 inch standard Synchro micro guidewire with a J configuration, the combination of the 021 Trevo ProVue microcatheter inside of a 6 [place] Catalyst 132 cm guide catheter was advanced to the distal end of the balloon guide catheter. With the micro guidewire leading with a J-tip configuration to avoid dissections or inducing spasm, the  combination was navigated without difficulty to the supraclinoid right ICA. The occluded right middle cerebral artery was entered with a micro guidewire which was advanced to the M3 regions of the inferior division of the right middle cerebral artery followed by the microcatheter. The guidewire was removed. Good aspiration was obtained from the hub of the microcatheter. Gentle contrast injection demonstrated safe position of tip of the microcatheter. This was then connected to continuous heparinized saline infusion. A 4 mm x 40 mm Solitaire X retrieval device was then advanced to the distal end of the microcatheter. The O ring on the delivery microcatheter was then loosened. With slight forward gentle traction with the right hand on the delivery micro guidewire with left hand the delivery microcatheter was retrieved unsheathing the retrieval device. The Catalyst 6 [place] guide catheter was advanced to the occluded right middle cerebral artery. With proximal flow arrest obtained by inflating the balloon in the right internal carotid artery, and after constant aspiration using a Penumbra aspiration device at the hub of the 6 [place] Catalyst guide catheter, and with a 60 mL syringe at the hub of the balloon guide catheter, the combination of the retrieval device, the microcatheter, and the 6 [place] Catalyst guide were retrieved and removed as constant aspiration was applied at the hub of the balloon guide catheter. Proximal flow arrest was reversed. A control arteriogram performed through the balloon guide catheter which had been advanced into the mid right internal carotid artery demonstrated complete opacification of the right middle cerebral artery and the inferior division. However, the superior division remained occluded. A second pass was then made again using the above combination. With the micro guidewire leading, access into the M3 region of the superior division was obtained with a microcatheter. The micro  guidewire was removed. Good aspiration was established at the hub of the microcatheter. A gentle control arteriogram again demonstrates safe positioning of the tip of the microcatheter which was then connected to continuous heparinized saline infusion. Again with constant flow arrest in the right internal carotid artery, and aspiration at the hub of the Catalyst guide catheter using the Penumbra aspiration device at the origin of the superior division for 2 minutes, and with a 60 mL syringe at the hub of the balloon guide catheter, the combination of the retrieval device, the microcatheter and the 6 [place] Catalyst guide catheter were retrieved and removed. Following reversal of flow arrest, control arteriogram performed through the balloon guide catheter in the right internal carotid artery now demonstrated revascularization of the superior division and also further improved caliber of the inferior division. There continued to be a very small branch extending into the anterior temporal region which was occluded in the M2 region. Also noted was a distal M3 M4 junction branch occlusion of a parietal branch of the inferior division. A TICI 2C revascularization had been achieved. The right anterior cerebral artery remained widely patent with cross-filling via the anterior communicating artery of the left anterior cerebral A2 segment and distally. The posterior communicating artery remain widely patent. Moderate spasm in  the distal cervical right ICA, and the superior division of the right middle cerebral artery responded to 2 aliquots of 25 mcg of nitroglycerin intra-arterially. A final control arteriogram performed through the balloon guide catheter in the right common carotid artery demonstrated significantly improved caliber of the right internal carotid artery, with maintenance of a TICI 2c revascularization of the right middle cerebral artery distribution. The balloon guide catheter was removed. The right groin 8  French Pinnacle sheath was then removed with manual compression in the right groin. Distal pulses remained Dopplerable in the dorsalis pedis, and the posterior tibial regions bilaterally unchanged. A flat panel CT of the brain demonstrated heterogeneous hyper attenuation in the right temporal lobe region anteriorly probably representing contrast stain versus less likely hemorrhagic conversion of infarct tissue. There was no mass-effect noted on the right lateral ventricle. Patient was then transferred to the ICU unit for post thrombectomy management. Patient was left intubated on account of his COVID status. IMPRESSION: Endovascular near complete revascularization of the right middle cerebral artery territory with 2 passes with the 4 mm x 40 mm Solitaire X retrieval device and Penumbra aspiration achieving a TICI 2c revascularization. PLAN: Follow-up as per referring MD. Electronically Signed   By: Luanne Bras M.D.   On: 04/18/2019 10:42   CT HEAD CODE STROKE WO CONTRAST  Result Date: 04/17/2019 CLINICAL DATA:  Code stroke. Left-sided facial droop/weakness, last known well noon yesterday EXAM: CT HEAD WITHOUT CONTRAST TECHNIQUE: Contiguous axial images were obtained from the base of the skull through the vertex without intravenous contrast. COMPARISON:  No pertinent prior studies available for comparison. 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 Subtle petechial hemorrhage is questioned within a region of infarction within the anterior right frontal lobe (series 2, image 19). No significant mass effect or midline shift. No extra-axial fluid collection. Background mild ill-defined hypoattenuation within the cerebral white matter is nonspecific, but consistent with chronic small vessel  ischemic disease. Mild generalized parenchymal atrophy. Vascular: Abnormal hyperdensity of the M1 right MCA likely reflecting thrombus. Atherosclerotic calcifications. Skull: No calvarial fracture or suspicious osseous lesion. Sinuses/Orbits: Visualized orbits demonstrate no acute abnormality. Mild paranasal sinus mucosal thickening. No significant mastoid effusion. ASPECTS ([place] Stroke Program Early CT Score) - Ganglionic level infarction (caudate, lentiform nuclei, internal capsule, insula, M1-M3 cortex): 4 (points subtracted for M2, M3 and insula) - Supraganglionic infarction (M4-M6 cortex): 0 Total score (0-10 with 10 being normal): 4 These results were communicated to Dr. Lorraine Lax At 10:38 amon 2/10/2021by text page via the [facility] messaging system. IMPRESSION: 1. 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 ASPECTS 4. Subtle petechial hemorrhage is questioned within an anterior right frontal lobe infarct site. No significant mass effect. 2. Hyperdensity of the M1 right MCA likely reflecting thrombus. 3. Mild generalized parenchymal atrophy and chronic small vessel ischemic disease. Electronically Signed   By: Kellie Simmering DO   On: 04/17/2019 10:38   VAS [place] LOWER EXTREMITY VENOUS (DVT)  Result Date: 04/18/2019  Lower Venous DVTStudy Indications: Elevated Ddimer.  Risk Factors: COVID 19 positive. Limitations: Bandages, open wound and patient positioning. Comparison Study: No prior studies. Performing Technologist: Oliver Hum RVT  Examination Guidelines: A complete evaluation includes B-mode imaging, spectral Doppler, color Doppler, and power Doppler as needed of all accessible portions of each vessel.  Bilateral testing is considered an integral part of a complete examination. Limited examinations for reoccurring indications may be performed as noted. The reflux portion of the exam is performed with the patient in reverse Trendelenburg.   +---------+---------------+---------+-----------+----------+--------------+ RIGHT    CompressibilityPhasicitySpontaneityPropertiesThrombus Aging +---------+---------------+---------+-----------+----------+--------------+ CFV                                                   Not visualized +---------+---------------+---------+-----------+----------+--------------+ SFJ                                                   Not visualized +---------+---------------+---------+-----------+----------+--------------+ FV Prox  Full           Yes      Yes                                 +---------+---------------+---------+-----------+----------+--------------+ FV Mid   Full                                                        +---------+---------------+---------+-----------+----------+--------------+ FV DistalFull                                                        +---------+---------------+---------+-----------+----------+--------------+ PFV      Full                                                        +---------+---------------+---------+-----------+----------+--------------+ POP      Full           Yes      Yes                                 +---------+---------------+---------+-----------+----------+--------------+ PTV      Full                                                        +---------+---------------+---------+-----------+----------+--------------+ PERO     Full                                                        +---------+---------------+---------+-----------+----------+--------------+ Unable to visualize the CFV, SFJ due to bandages.  +---------+---------------+---------+-----------+----------+--------------+ LEFT     CompressibilityPhasicitySpontaneityPropertiesThrombus Aging +---------+---------------+---------+-----------+----------+--------------+ CFV      Full  Yes      Yes                                  +---------+---------------+---------+-----------+----------+--------------+ SFJ      Full                                                        +---------+---------------+---------+-----------+----------+--------------+ FV Prox  Full                                                        +---------+---------------+---------+-----------+----------+--------------+ FV Mid   Full                                                        +---------+---------------+---------+-----------+----------+--------------+ FV DistalFull                                                        +---------+---------------+---------+-----------+----------+--------------+ PFV      Full                                                        +---------+---------------+---------+-----------+----------+--------------+ POP      Full           Yes      Yes                                 +---------+---------------+---------+-----------+----------+--------------+ PTV      Full                                                        +---------+---------------+---------+-----------+----------+--------------+ PERO     Full                                                        +---------+---------------+---------+-----------+----------+--------------+ Gastroc  None                                         Acute          +---------+---------------+---------+-----------+----------+--------------+     Summary: RIGHT: - There is no evidence of deep vein thrombosis in the lower  extremity.  - No cystic structure found in the popliteal fossa.  LEFT: - Findings consistent with acute deep vein thrombosis involving the left gastrocnemius veins. - No cystic structure found in the popliteal fossa.  *See table(s) above for measurements and observations. Electronically signed by Servando Snare MD on 04/18/2019 at 6:33:22 PM.    Final    ECHOCARDIOGRAM LIMITED  Result Date: 04/18/2019    ECHOCARDIOGRAM  LIMITED REPORT   Patient Name:   Henry [name] Date of Exam: 04/17/2019 Medical Rec #:  [id number]       Height:       68.0 in Accession #:    [id number]      Weight:       180.0 lb Date of Birth:  1940/03/26       BSA:          1.95 m Patient Age:    43 years        BP:           141/51 mmHg Patient Gender: M               HR:           57 bpm. Exam Location:  Inpatient Procedure: Limited Echo and Cardiac Doppler Indications:    Stroke 434.91 / I163.9  History:        Patient has no prior history of Echocardiogram examinations.                 Signs/Symptoms:Chest Pain; Risk Factors:Hypertension. Pneumonia                 due to COVID-19 virus.  Sonographer:    Jaquita Folds Referring Phys: [place]  1. Left ventricular ejection fraction, by estimation, is 55 to 60%. The left ventricle has normal function. The left ventrical has no regional wall motion abnormalities. Left ventricular diastolic parameters are indeterminate.  2. Right ventricular systolic function is normal. The right ventricular size is normal. There is normal pulmonary artery systolic pressure.  3. The mitral valve is normal in structure and function. trivial mitral valve regurgitation. No evidence of mitral stenosis.  4. The aortic valve is normal in structure and function. Aortic valve regurgitation is not visualized. No aortic stenosis is present. FINDINGS  Left Ventricle: Left ventricular ejection fraction, by estimation, is 55 to 60%. The left ventricle has normal function. The left ventricle has no regional wall motion abnormalities. There is no left ventricular hypertrophy. Right Ventricle: The right ventricular size is normal. No increase in right ventricular wall thickness. Right ventricular systolic function is normal. There is normal pulmonary artery systolic pressure. The tricuspid regurgitant velocity is 1.86 m/s, and  with an assumed right atrial pressure of 3 mmHg, the estimated right ventricular systolic  pressure is 123XX123 mmHg. Mitral Valve: The mitral valve is normal in structure and function. Trivial mitral valve regurgitation. No evidence of mitral valve stenosis. Tricuspid Valve: The tricuspid valve is grossly normal. Tricuspid valve regurgitation is mild. Aortic Valve: The aortic valve is normal in structure and function. Aortic valve regurgitation is not visualized. No aortic stenosis is present. Aorta: The aortic root, ascending aorta and aortic arch are all structurally normal, with no evidence of dilitation or obstruction.  LEFT VENTRICLE PLAX 2D LVIDd:         4.60 cm  Diastology LVIDs:         3.60 cm  LV e' lateral:   8.04 cm/s LV PW:  0.90 cm  LV E/e' lateral: 10.1 LV IVS:        0.90 cm  LV e' medial:    5.91 cm/s LVOT diam:     2.00 cm  LV E/e' medial:  13.8 LV SV:         55.61 ml LV SV Index:   21.50 LVOT Area:     3.14 cm  LEFT ATRIUM             Index LA diam:        4.30 cm 2.20 cm/m LA Vol (A2C):   50.7 ml 25.94 ml/m LA Vol (A4C):   43.8 ml 22.41 ml/m LA Biplane Vol: 48.7 ml 24.92 ml/m  AORTIC VALVE LVOT Vmax:   79.40 cm/s LVOT Vmean:  57.900 cm/s LVOT VTI:    0.177 m  AORTA Ao Root diam: 2.70 cm MITRAL VALVE                        TRICUSPID VALVE MV Area (PHT): 2.50 cm             TR Peak grad:   13.8 mmHg MV Decel Time: 303 msec             TR Vmax:        186.00 cm/s MV E velocity: 81.60 cm/s 103 cm/s MV A velocity: 74.40 cm/s 70.3 cm/s SHUNTS MV E/A ratio:  1.10       1.5       Systemic VTI:  0.18 m                                     Systemic Diam: 2.00 cm Mertie Moores MD Electronically signed by Mertie Moores MD Signature Date/Time: 04/18/2019/5:07:36 PM    Final     Assessment & Plan:   There are no diagnoses linked to this encounter.   No orders of the defined types were placed in this encounter.    Follow-up: No follow-ups on file.  Walker Kehr, MD

## 2019-06-27 NOTE — Assessment & Plan Note (Signed)
New BPV Depo-medrol IM Meclizine po PT at home

## 2019-07-01 DIAGNOSIS — I69391 Dysphagia following cerebral infarction: Secondary | ICD-10-CM | POA: Diagnosis not present

## 2019-07-01 DIAGNOSIS — G8929 Other chronic pain: Secondary | ICD-10-CM | POA: Diagnosis not present

## 2019-07-01 DIAGNOSIS — I69354 Hemiplegia and hemiparesis following cerebral infarction affecting left non-dominant side: Secondary | ICD-10-CM | POA: Diagnosis not present

## 2019-07-01 DIAGNOSIS — D751 Secondary polycythemia: Secondary | ICD-10-CM | POA: Diagnosis not present

## 2019-07-01 DIAGNOSIS — M1991 Primary osteoarthritis, unspecified site: Secondary | ICD-10-CM | POA: Diagnosis not present

## 2019-07-01 DIAGNOSIS — J9601 Acute respiratory failure with hypoxia: Secondary | ICD-10-CM | POA: Diagnosis not present

## 2019-07-01 DIAGNOSIS — R1312 Dysphagia, oropharyngeal phase: Secondary | ICD-10-CM | POA: Diagnosis not present

## 2019-07-01 DIAGNOSIS — H5462 Unqualified visual loss, left eye, normal vision right eye: Secondary | ICD-10-CM | POA: Diagnosis not present

## 2019-07-01 DIAGNOSIS — M545 Low back pain: Secondary | ICD-10-CM | POA: Diagnosis not present

## 2019-07-03 DIAGNOSIS — M1991 Primary osteoarthritis, unspecified site: Secondary | ICD-10-CM | POA: Diagnosis not present

## 2019-07-03 DIAGNOSIS — G8929 Other chronic pain: Secondary | ICD-10-CM | POA: Diagnosis not present

## 2019-07-03 DIAGNOSIS — I69354 Hemiplegia and hemiparesis following cerebral infarction affecting left non-dominant side: Secondary | ICD-10-CM | POA: Diagnosis not present

## 2019-07-03 DIAGNOSIS — H5462 Unqualified visual loss, left eye, normal vision right eye: Secondary | ICD-10-CM | POA: Diagnosis not present

## 2019-07-03 DIAGNOSIS — M545 Low back pain: Secondary | ICD-10-CM | POA: Diagnosis not present

## 2019-07-03 DIAGNOSIS — D751 Secondary polycythemia: Secondary | ICD-10-CM | POA: Diagnosis not present

## 2019-07-03 DIAGNOSIS — I69391 Dysphagia following cerebral infarction: Secondary | ICD-10-CM | POA: Diagnosis not present

## 2019-07-03 DIAGNOSIS — R1312 Dysphagia, oropharyngeal phase: Secondary | ICD-10-CM | POA: Diagnosis not present

## 2019-07-03 DIAGNOSIS — J9601 Acute respiratory failure with hypoxia: Secondary | ICD-10-CM | POA: Diagnosis not present

## 2019-07-05 ENCOUNTER — Telehealth: Payer: Self-pay | Admitting: Internal Medicine

## 2019-07-05 DIAGNOSIS — H5462 Unqualified visual loss, left eye, normal vision right eye: Secondary | ICD-10-CM | POA: Diagnosis not present

## 2019-07-05 DIAGNOSIS — M545 Low back pain: Secondary | ICD-10-CM | POA: Diagnosis not present

## 2019-07-05 DIAGNOSIS — I69354 Hemiplegia and hemiparesis following cerebral infarction affecting left non-dominant side: Secondary | ICD-10-CM | POA: Diagnosis not present

## 2019-07-05 DIAGNOSIS — I69391 Dysphagia following cerebral infarction: Secondary | ICD-10-CM | POA: Diagnosis not present

## 2019-07-05 DIAGNOSIS — D751 Secondary polycythemia: Secondary | ICD-10-CM | POA: Diagnosis not present

## 2019-07-05 DIAGNOSIS — R1312 Dysphagia, oropharyngeal phase: Secondary | ICD-10-CM | POA: Diagnosis not present

## 2019-07-05 DIAGNOSIS — M1991 Primary osteoarthritis, unspecified site: Secondary | ICD-10-CM | POA: Diagnosis not present

## 2019-07-05 DIAGNOSIS — G8929 Other chronic pain: Secondary | ICD-10-CM | POA: Diagnosis not present

## 2019-07-05 DIAGNOSIS — J9601 Acute respiratory failure with hypoxia: Secondary | ICD-10-CM | POA: Diagnosis not present

## 2019-07-05 NOTE — Telephone Encounter (Signed)
Verbals given, FYI 

## 2019-07-05 NOTE — Telephone Encounter (Signed)
Tyler with [facility] called and was requesting orders to continue PT for once a week for nine weeks.

## 2019-07-09 DIAGNOSIS — M1991 Primary osteoarthritis, unspecified site: Secondary | ICD-10-CM | POA: Diagnosis not present

## 2019-07-09 DIAGNOSIS — J9601 Acute respiratory failure with hypoxia: Secondary | ICD-10-CM | POA: Diagnosis not present

## 2019-07-09 DIAGNOSIS — R1312 Dysphagia, oropharyngeal phase: Secondary | ICD-10-CM | POA: Diagnosis not present

## 2019-07-09 DIAGNOSIS — M545 Low back pain: Secondary | ICD-10-CM | POA: Diagnosis not present

## 2019-07-09 DIAGNOSIS — G8929 Other chronic pain: Secondary | ICD-10-CM | POA: Diagnosis not present

## 2019-07-09 DIAGNOSIS — I69391 Dysphagia following cerebral infarction: Secondary | ICD-10-CM | POA: Diagnosis not present

## 2019-07-09 DIAGNOSIS — H5462 Unqualified visual loss, left eye, normal vision right eye: Secondary | ICD-10-CM | POA: Diagnosis not present

## 2019-07-09 DIAGNOSIS — D751 Secondary polycythemia: Secondary | ICD-10-CM | POA: Diagnosis not present

## 2019-07-09 DIAGNOSIS — I69354 Hemiplegia and hemiparesis following cerebral infarction affecting left non-dominant side: Secondary | ICD-10-CM | POA: Diagnosis not present

## 2019-07-10 ENCOUNTER — Ambulatory Visit: Payer: Medicare HMO | Admitting: Internal Medicine

## 2019-07-10 DIAGNOSIS — D751 Secondary polycythemia: Secondary | ICD-10-CM | POA: Diagnosis not present

## 2019-07-10 DIAGNOSIS — H5462 Unqualified visual loss, left eye, normal vision right eye: Secondary | ICD-10-CM | POA: Diagnosis not present

## 2019-07-10 DIAGNOSIS — I69391 Dysphagia following cerebral infarction: Secondary | ICD-10-CM | POA: Diagnosis not present

## 2019-07-10 DIAGNOSIS — M545 Low back pain: Secondary | ICD-10-CM | POA: Diagnosis not present

## 2019-07-10 DIAGNOSIS — M1991 Primary osteoarthritis, unspecified site: Secondary | ICD-10-CM | POA: Diagnosis not present

## 2019-07-10 DIAGNOSIS — J9601 Acute respiratory failure with hypoxia: Secondary | ICD-10-CM | POA: Diagnosis not present

## 2019-07-10 DIAGNOSIS — I69354 Hemiplegia and hemiparesis following cerebral infarction affecting left non-dominant side: Secondary | ICD-10-CM | POA: Diagnosis not present

## 2019-07-10 DIAGNOSIS — G8929 Other chronic pain: Secondary | ICD-10-CM | POA: Diagnosis not present

## 2019-07-10 DIAGNOSIS — R1312 Dysphagia, oropharyngeal phase: Secondary | ICD-10-CM | POA: Diagnosis not present

## 2019-07-12 DIAGNOSIS — M1991 Primary osteoarthritis, unspecified site: Secondary | ICD-10-CM | POA: Diagnosis not present

## 2019-07-12 DIAGNOSIS — D751 Secondary polycythemia: Secondary | ICD-10-CM | POA: Diagnosis not present

## 2019-07-12 DIAGNOSIS — I69391 Dysphagia following cerebral infarction: Secondary | ICD-10-CM | POA: Diagnosis not present

## 2019-07-12 DIAGNOSIS — M545 Low back pain: Secondary | ICD-10-CM | POA: Diagnosis not present

## 2019-07-12 DIAGNOSIS — J9601 Acute respiratory failure with hypoxia: Secondary | ICD-10-CM | POA: Diagnosis not present

## 2019-07-12 DIAGNOSIS — I69354 Hemiplegia and hemiparesis following cerebral infarction affecting left non-dominant side: Secondary | ICD-10-CM | POA: Diagnosis not present

## 2019-07-12 DIAGNOSIS — H5462 Unqualified visual loss, left eye, normal vision right eye: Secondary | ICD-10-CM | POA: Diagnosis not present

## 2019-07-12 DIAGNOSIS — R1312 Dysphagia, oropharyngeal phase: Secondary | ICD-10-CM | POA: Diagnosis not present

## 2019-07-12 DIAGNOSIS — G8929 Other chronic pain: Secondary | ICD-10-CM | POA: Diagnosis not present

## 2019-07-15 DIAGNOSIS — M545 Low back pain: Secondary | ICD-10-CM | POA: Diagnosis not present

## 2019-07-15 DIAGNOSIS — I69354 Hemiplegia and hemiparesis following cerebral infarction affecting left non-dominant side: Secondary | ICD-10-CM | POA: Diagnosis not present

## 2019-07-15 DIAGNOSIS — M1991 Primary osteoarthritis, unspecified site: Secondary | ICD-10-CM | POA: Diagnosis not present

## 2019-07-15 DIAGNOSIS — D751 Secondary polycythemia: Secondary | ICD-10-CM | POA: Diagnosis not present

## 2019-07-15 DIAGNOSIS — J9601 Acute respiratory failure with hypoxia: Secondary | ICD-10-CM | POA: Diagnosis not present

## 2019-07-15 DIAGNOSIS — H5462 Unqualified visual loss, left eye, normal vision right eye: Secondary | ICD-10-CM | POA: Diagnosis not present

## 2019-07-15 DIAGNOSIS — G8929 Other chronic pain: Secondary | ICD-10-CM | POA: Diagnosis not present

## 2019-07-15 DIAGNOSIS — R1312 Dysphagia, oropharyngeal phase: Secondary | ICD-10-CM | POA: Diagnosis not present

## 2019-07-15 DIAGNOSIS — I69391 Dysphagia following cerebral infarction: Secondary | ICD-10-CM | POA: Diagnosis not present

## 2019-07-16 DIAGNOSIS — J9601 Acute respiratory failure with hypoxia: Secondary | ICD-10-CM | POA: Diagnosis not present

## 2019-07-16 DIAGNOSIS — H5462 Unqualified visual loss, left eye, normal vision right eye: Secondary | ICD-10-CM | POA: Diagnosis not present

## 2019-07-16 DIAGNOSIS — M545 Low back pain: Secondary | ICD-10-CM | POA: Diagnosis not present

## 2019-07-16 DIAGNOSIS — I69391 Dysphagia following cerebral infarction: Secondary | ICD-10-CM | POA: Diagnosis not present

## 2019-07-16 DIAGNOSIS — D751 Secondary polycythemia: Secondary | ICD-10-CM | POA: Diagnosis not present

## 2019-07-16 DIAGNOSIS — M1991 Primary osteoarthritis, unspecified site: Secondary | ICD-10-CM | POA: Diagnosis not present

## 2019-07-16 DIAGNOSIS — R1312 Dysphagia, oropharyngeal phase: Secondary | ICD-10-CM | POA: Diagnosis not present

## 2019-07-16 DIAGNOSIS — I69354 Hemiplegia and hemiparesis following cerebral infarction affecting left non-dominant side: Secondary | ICD-10-CM | POA: Diagnosis not present

## 2019-07-16 DIAGNOSIS — G8929 Other chronic pain: Secondary | ICD-10-CM | POA: Diagnosis not present

## 2019-07-17 DIAGNOSIS — H5462 Unqualified visual loss, left eye, normal vision right eye: Secondary | ICD-10-CM

## 2019-07-17 DIAGNOSIS — J9601 Acute respiratory failure with hypoxia: Secondary | ICD-10-CM

## 2019-07-17 DIAGNOSIS — H109 Unspecified conjunctivitis: Secondary | ICD-10-CM

## 2019-07-17 DIAGNOSIS — I82402 Acute embolism and thrombosis of unspecified deep veins of left lower extremity: Secondary | ICD-10-CM

## 2019-07-17 DIAGNOSIS — Z9181 History of falling: Secondary | ICD-10-CM

## 2019-07-17 DIAGNOSIS — R3915 Urgency of urination: Secondary | ICD-10-CM

## 2019-07-17 DIAGNOSIS — Z7901 Long term (current) use of anticoagulants: Secondary | ICD-10-CM

## 2019-07-17 DIAGNOSIS — K219 Gastro-esophageal reflux disease without esophagitis: Secondary | ICD-10-CM

## 2019-07-17 DIAGNOSIS — G8929 Other chronic pain: Secondary | ICD-10-CM

## 2019-07-17 DIAGNOSIS — R1312 Dysphagia, oropharyngeal phase: Secondary | ICD-10-CM

## 2019-07-17 DIAGNOSIS — E785 Hyperlipidemia, unspecified: Secondary | ICD-10-CM

## 2019-07-17 DIAGNOSIS — I69354 Hemiplegia and hemiparesis following cerebral infarction affecting left non-dominant side: Secondary | ICD-10-CM

## 2019-07-17 DIAGNOSIS — I69391 Dysphagia following cerebral infarction: Secondary | ICD-10-CM

## 2019-07-17 DIAGNOSIS — N138 Other obstructive and reflux uropathy: Secondary | ICD-10-CM

## 2019-07-17 DIAGNOSIS — N401 Enlarged prostate with lower urinary tract symptoms: Secondary | ICD-10-CM

## 2019-07-17 DIAGNOSIS — L03011 Cellulitis of right finger: Secondary | ICD-10-CM

## 2019-07-17 DIAGNOSIS — M545 Low back pain: Secondary | ICD-10-CM

## 2019-07-17 DIAGNOSIS — Z8701 Personal history of pneumonia (recurrent): Secondary | ICD-10-CM

## 2019-07-17 DIAGNOSIS — H9193 Unspecified hearing loss, bilateral: Secondary | ICD-10-CM

## 2019-07-17 DIAGNOSIS — Z86010 Personal history of colonic polyps: Secondary | ICD-10-CM

## 2019-07-17 DIAGNOSIS — K5901 Slow transit constipation: Secondary | ICD-10-CM

## 2019-07-17 DIAGNOSIS — D751 Secondary polycythemia: Secondary | ICD-10-CM

## 2019-07-17 DIAGNOSIS — R350 Frequency of micturition: Secondary | ICD-10-CM

## 2019-07-17 DIAGNOSIS — M1991 Primary osteoarthritis, unspecified site: Secondary | ICD-10-CM

## 2019-07-23 ENCOUNTER — Inpatient Hospital Stay: Payer: Medicare HMO | Admitting: Neurology

## 2019-07-23 DIAGNOSIS — M545 Low back pain: Secondary | ICD-10-CM | POA: Diagnosis not present

## 2019-07-23 DIAGNOSIS — M1991 Primary osteoarthritis, unspecified site: Secondary | ICD-10-CM | POA: Diagnosis not present

## 2019-07-23 DIAGNOSIS — G8929 Other chronic pain: Secondary | ICD-10-CM | POA: Diagnosis not present

## 2019-07-23 DIAGNOSIS — D751 Secondary polycythemia: Secondary | ICD-10-CM | POA: Diagnosis not present

## 2019-07-23 DIAGNOSIS — H5462 Unqualified visual loss, left eye, normal vision right eye: Secondary | ICD-10-CM | POA: Diagnosis not present

## 2019-07-23 DIAGNOSIS — R1312 Dysphagia, oropharyngeal phase: Secondary | ICD-10-CM | POA: Diagnosis not present

## 2019-07-23 DIAGNOSIS — I69391 Dysphagia following cerebral infarction: Secondary | ICD-10-CM | POA: Diagnosis not present

## 2019-07-23 DIAGNOSIS — J9601 Acute respiratory failure with hypoxia: Secondary | ICD-10-CM | POA: Diagnosis not present

## 2019-07-23 DIAGNOSIS — I69354 Hemiplegia and hemiparesis following cerebral infarction affecting left non-dominant side: Secondary | ICD-10-CM | POA: Diagnosis not present

## 2019-07-25 DIAGNOSIS — M545 Low back pain: Secondary | ICD-10-CM | POA: Diagnosis not present

## 2019-07-25 DIAGNOSIS — M1991 Primary osteoarthritis, unspecified site: Secondary | ICD-10-CM | POA: Diagnosis not present

## 2019-07-25 DIAGNOSIS — H5462 Unqualified visual loss, left eye, normal vision right eye: Secondary | ICD-10-CM | POA: Diagnosis not present

## 2019-07-25 DIAGNOSIS — D751 Secondary polycythemia: Secondary | ICD-10-CM | POA: Diagnosis not present

## 2019-07-25 DIAGNOSIS — G8929 Other chronic pain: Secondary | ICD-10-CM | POA: Diagnosis not present

## 2019-07-25 DIAGNOSIS — J9601 Acute respiratory failure with hypoxia: Secondary | ICD-10-CM | POA: Diagnosis not present

## 2019-07-25 DIAGNOSIS — R1312 Dysphagia, oropharyngeal phase: Secondary | ICD-10-CM | POA: Diagnosis not present

## 2019-07-25 DIAGNOSIS — I69391 Dysphagia following cerebral infarction: Secondary | ICD-10-CM | POA: Diagnosis not present

## 2019-07-25 DIAGNOSIS — I69354 Hemiplegia and hemiparesis following cerebral infarction affecting left non-dominant side: Secondary | ICD-10-CM | POA: Diagnosis not present

## 2019-07-29 DIAGNOSIS — R1312 Dysphagia, oropharyngeal phase: Secondary | ICD-10-CM | POA: Diagnosis not present

## 2019-07-29 DIAGNOSIS — M545 Low back pain: Secondary | ICD-10-CM | POA: Diagnosis not present

## 2019-07-29 DIAGNOSIS — I69354 Hemiplegia and hemiparesis following cerebral infarction affecting left non-dominant side: Secondary | ICD-10-CM | POA: Diagnosis not present

## 2019-07-29 DIAGNOSIS — J9601 Acute respiratory failure with hypoxia: Secondary | ICD-10-CM | POA: Diagnosis not present

## 2019-07-29 DIAGNOSIS — M1991 Primary osteoarthritis, unspecified site: Secondary | ICD-10-CM | POA: Diagnosis not present

## 2019-07-29 DIAGNOSIS — I69391 Dysphagia following cerebral infarction: Secondary | ICD-10-CM | POA: Diagnosis not present

## 2019-07-29 DIAGNOSIS — H5462 Unqualified visual loss, left eye, normal vision right eye: Secondary | ICD-10-CM | POA: Diagnosis not present

## 2019-07-29 DIAGNOSIS — G8929 Other chronic pain: Secondary | ICD-10-CM | POA: Diagnosis not present

## 2019-07-29 DIAGNOSIS — D751 Secondary polycythemia: Secondary | ICD-10-CM | POA: Diagnosis not present

## 2019-08-01 DIAGNOSIS — H5462 Unqualified visual loss, left eye, normal vision right eye: Secondary | ICD-10-CM | POA: Diagnosis not present

## 2019-08-01 DIAGNOSIS — M545 Low back pain: Secondary | ICD-10-CM | POA: Diagnosis not present

## 2019-08-01 DIAGNOSIS — I69354 Hemiplegia and hemiparesis following cerebral infarction affecting left non-dominant side: Secondary | ICD-10-CM | POA: Diagnosis not present

## 2019-08-01 DIAGNOSIS — D751 Secondary polycythemia: Secondary | ICD-10-CM | POA: Diagnosis not present

## 2019-08-01 DIAGNOSIS — J9601 Acute respiratory failure with hypoxia: Secondary | ICD-10-CM | POA: Diagnosis not present

## 2019-08-01 DIAGNOSIS — R1312 Dysphagia, oropharyngeal phase: Secondary | ICD-10-CM | POA: Diagnosis not present

## 2019-08-01 DIAGNOSIS — G8929 Other chronic pain: Secondary | ICD-10-CM | POA: Diagnosis not present

## 2019-08-01 DIAGNOSIS — I69391 Dysphagia following cerebral infarction: Secondary | ICD-10-CM | POA: Diagnosis not present

## 2019-08-01 DIAGNOSIS — M1991 Primary osteoarthritis, unspecified site: Secondary | ICD-10-CM | POA: Diagnosis not present

## 2019-08-02 ENCOUNTER — Other Ambulatory Visit: Payer: Self-pay

## 2019-08-02 NOTE — Patient Outreach (Signed)
First telephone outreach attempt to obtain mRS. No answer. Left message for returned call.  Ina Homes [facility] Management Assistant [phone]

## 2019-08-07 ENCOUNTER — Other Ambulatory Visit: Payer: Self-pay

## 2019-08-07 NOTE — Patient Outreach (Signed)
Second telephone outreach attempt to obtain mRS. No answer. Left message for returned call.  Henry [name] [facility] Management Assistant [phone] 

## 2019-08-08 ENCOUNTER — Other Ambulatory Visit: Payer: Self-pay

## 2019-08-08 ENCOUNTER — Encounter: Payer: Self-pay | Admitting: Internal Medicine

## 2019-08-08 ENCOUNTER — Ambulatory Visit (INDEPENDENT_AMBULATORY_CARE_PROVIDER_SITE_OTHER): Payer: Medicare HMO | Admitting: Internal Medicine

## 2019-08-08 VITALS — BP 122/88 | HR 72 | Temp 98.4°F | Ht 68.0 in | Wt 149.0 lb

## 2019-08-08 DIAGNOSIS — N401 Enlarged prostate with lower urinary tract symptoms: Secondary | ICD-10-CM | POA: Diagnosis not present

## 2019-08-08 DIAGNOSIS — K5900 Constipation, unspecified: Secondary | ICD-10-CM | POA: Insufficient documentation

## 2019-08-08 DIAGNOSIS — I63413 Cerebral infarction due to embolism of bilateral middle cerebral arteries: Secondary | ICD-10-CM

## 2019-08-08 DIAGNOSIS — K5903 Drug induced constipation: Secondary | ICD-10-CM | POA: Diagnosis not present

## 2019-08-08 DIAGNOSIS — U071 COVID-19: Secondary | ICD-10-CM

## 2019-08-08 DIAGNOSIS — D751 Secondary polycythemia: Secondary | ICD-10-CM | POA: Diagnosis not present

## 2019-08-08 DIAGNOSIS — R338 Other retention of urine: Secondary | ICD-10-CM | POA: Diagnosis not present

## 2019-08-08 DIAGNOSIS — R1312 Dysphagia, oropharyngeal phase: Secondary | ICD-10-CM | POA: Diagnosis not present

## 2019-08-08 DIAGNOSIS — M1991 Primary osteoarthritis, unspecified site: Secondary | ICD-10-CM | POA: Diagnosis not present

## 2019-08-08 DIAGNOSIS — I10 Essential (primary) hypertension: Secondary | ICD-10-CM

## 2019-08-08 DIAGNOSIS — H5462 Unqualified visual loss, left eye, normal vision right eye: Secondary | ICD-10-CM | POA: Diagnosis not present

## 2019-08-08 DIAGNOSIS — G8929 Other chronic pain: Secondary | ICD-10-CM | POA: Diagnosis not present

## 2019-08-08 DIAGNOSIS — I69391 Dysphagia following cerebral infarction: Secondary | ICD-10-CM | POA: Diagnosis not present

## 2019-08-08 DIAGNOSIS — M545 Low back pain: Secondary | ICD-10-CM | POA: Diagnosis not present

## 2019-08-08 DIAGNOSIS — J9601 Acute respiratory failure with hypoxia: Secondary | ICD-10-CM | POA: Diagnosis not present

## 2019-08-08 DIAGNOSIS — I69354 Hemiplegia and hemiparesis following cerebral infarction affecting left non-dominant side: Secondary | ICD-10-CM | POA: Diagnosis not present

## 2019-08-08 MED ORDER — SENNOSIDES-DOCUSATE SODIUM 8.6-50 MG PO TABS: 2.0000 | ORAL_TABLET | Freq: Two times a day (BID) | ORAL | 6 refills | Status: DC

## 2019-08-08 MED ORDER — ATORVASTATIN CALCIUM 40 MG PO TABS: 20.0000 mg | ORAL_TABLET | Freq: Every day | ORAL | 11 refills | Status: DC

## 2019-08-08 MED ORDER — B COMPLEX PLUS PO TABS: 1.0000 | ORAL_TABLET | Freq: Every day | ORAL | 3 refills | Status: AC

## 2019-08-08 MED ORDER — LACTULOSE 20 GM/30ML PO SOLN: 30.0000 mL | Freq: Two times a day (BID) | ORAL | 3 refills | Status: DC | PRN

## 2019-08-08 NOTE — Assessment & Plan Note (Signed)
Due to meds Lactulose Sen[place]ot S Reduce Lipitor to 1/2 tab/day

## 2019-08-08 NOTE — Patient Instructions (Addendum)
Trekking poles  Reduce Lipitor to 1/2 tab/day

## 2019-08-08 NOTE — Progress Notes (Signed)
Subjective:  Patient ID: Henry [name], male    DOB: May 22, 1940  Age: 79 y.o. MRN: [id number]  CC: No chief complaint on file.   HPI Henry [name] presents for CVA, HTN C/o severe constipation on Lipitor - bad C/o hair loss   Outpatient Medications Prior to Visit  Medication Sig Dispense Refill  . apixaban (ELIQUIS) 5 MG TABS tablet Take 1 tablet (5 mg total) by mouth 2 (two) times daily. 60 tablet 11  . atorvastatin (LIPITOR) 40 MG tablet Take 1 tablet (40 mg total) by mouth daily at 6 PM. 30 tablet 11  . Cholecalciferol (VITAMIN D3) 50 MCG (2000 UT) capsule Take 1 capsule (2,000 Units total) by mouth daily. 100 capsule 3  . diltiazem (CARDIZEM) 60 MG tablet Take 1 tablet (60 mg total) by mouth 2 (two) times daily. 60 tablet 11  . Misc Natural Products (PROSTATE HEALTH) CAPS Take 1 capsule by mouth daily.    . Probiotic Product (ALIGN PO) Take 1 capsule by mouth daily.    . meclizine (ANTIVERT) 12.5 MG tablet Take 1 tablet (12.5 mg total) by mouth 3 (three) times daily as needed for dizziness. (Patient not taking: Reported on 08/08/2019) 60 tablet 1   No facility-administered medications prior to visit.    ROS: Review of Systems  Objective:  BP 122/88 (BP Location: Left Arm, Patient Position: Sitting, Cuff Size: Normal)   Pulse 72   Temp 98.4 F (36.9 C) (Oral)   Ht 5\' 8"  (1.727 m)   Wt 149 lb (67.6 kg)   SpO2 98%   BMI 22.66 kg/m   BP Readings from Last 3 Encounters:  08/08/19 122/88  06/27/19 106/74  05/29/19 118/74    Wt Readings from Last 3 Encounters:  08/08/19 149 lb (67.6 kg)  06/27/19 153 lb (69.4 kg)  05/29/19 156 lb 8 oz (71 kg)    Physical Exam  Lab Results  Component Value Date   WBC 6.4 05/29/2019   HGB 13.0 05/29/2019   HCT 38.0 (L) 05/29/2019   PLT 258.0 05/29/2019   GLUCOSE 114 (H) 05/29/2019   CHOL 93 04/18/2019   TRIG 108 04/25/2019   HDL 19 (L) 04/18/2019   LDLDIRECT 135.0 02/24/2017   LDLCALC 53 04/18/2019   ALT 33 05/29/2019    AST 31 05/29/2019   NA 139 05/29/2019   K 4.4 05/29/2019   CL 102 05/29/2019   CREATININE 0.74 05/29/2019   BUN 14 05/29/2019   CO2 31 05/29/2019   TSH 1.26 05/29/2019   PSA 1.75 02/24/2017   INR 1.2 04/17/2019   HGBA1C 6.6 (H) 05/29/2019    CT ANGIO HEAD W OR WO CONTRAST  Result Date: 04/17/2019 CLINICAL DATA:  Stroke, follow-up. EXAM: CT ANGIOGRAPHY HEAD AND NECK CT PERFUSION BRAIN TECHNIQUE: Multidetector CT imaging of the head and neck was performed using the standard protocol during bolus administration of intravenous contrast. Multiplanar CT image reconstructions and MIPs were obtained to evaluate the vascular anatomy. Carotid stenosis measurements (when applicable) are obtained utilizing NASCET criteria, using the distal internal carotid diameter as the denominator. Multiphase CT imaging of the brain was performed following IV bolus contrast injection. Subsequent parametric perfusion maps were calculated using RAPID software. CONTRAST:  139mL OMNIPAQUE IOHEXOL 350 MG/ML SOLN COMPARISON:  Noncontrast head CT performed earlier the same day. FINDINGS: CTA NECK FINDINGS Aortic arch: The origins of the innominate and left common carotid arteries are incompletely imaged. The visualized aortic arch is unremarkable. No significant innominate or proximal subclavian  stenosis visualized. Right carotid system: CCA patent to the bifurcation without stenosis. Prominent predominantly calcified plaque within the distal common carotid artery and proximal ICA. There is likely greater than 70% stenosis at the origin of the ICA, although exact quantification of stenosis is difficult due to irregularity of calcified plaque. Distal to this the ICA is patent within the neck without stenosis. Left carotid system: CCA patent to the bifurcation without stenosis. Calcified plaque within the distal common carotid artery, carotid bifurcation and proximal ICA. Resultant 30-40% narrowing of the proximal ICA as compared to  the more distal vessel. Distal to this the ICA is patent within the neck without stenosis. Vertebral arteries: Right vertebral artery dominant. 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 Calcified plaque at the origin of the medial V1 left vertebral artery branch with at least moderate ostial stenosis. More distally the bilateral vertebral arteries are patent within the neck without stenosis. Skeleton: No acute bony abnormality. Cervical spondylosis with C5-C6 and C6-C7 posterior disc osteophytes. Other neck: No neck mass or cervical adenopathy. Upper chest: Paraseptal emphysema within the imaged lung apices. Also within the imaged lung apices but there is interstitial prominence suspicious for interstitial lung disease. There are more nodular opacities within the right upper lobe measuring up to 1.4 cm (series 7, image 56) (series 7, image 99). Review of the MIP images confirms the above findings CTA HEAD FINDINGS Anterior circulation: The intracranial internal carotid arteries are patent with scattered calcified plaque. No more than mild stenosis within these vessels. 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 There is minimal reconstitution of flow seen within M2 and more distal right MCA branch vessels. The left middle cerebral artery is patent without high-grade proximal stenosis. The bilateral anterior cerebral arteries are patent without significant proximal stenosis. Posterior circulation: The intracranial vertebral arteries are patent without significant stenosis, as is the basilar artery. The bilateral posterior cerebral arteries are patent without significant proximal stenosis. A sizable right posterior communicating artery is present. The a left posterior communicating artery is poorly delineated and may be hypoplastic or absent. Venous sinuses: Within limitations of contrast timing, no convincing thrombus. Anatomic variants: As described Review of the MIP images confirms the above findings  CT Brain Perfusion Findings: ASPECTS: 4 CBF (<30%) Volume: 8mL (in the right MCA vascular territory) Perfusion (Tmax>6.0s) volume: 147 mL (in the right MCA vascular territory) Mismatch Volume: 161mL Infarction Location:Right MCA vascular territory Findings of a proximal M1 right MCA occlusion and the perfusion results were discussed by telephone at the time of interpretation on 04/17/2019 at 10:38 am to provider Dr. Lorraine Lax, who verbally acknowledged these results. IMPRESSION: CTA neck: 1. The common and internal carotid arteries are patent within the neck bilaterally. 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 There is likely greater than 70% stenosis at the origin of the right ICA. 30-40% stenosis of the proximal left ICA. 2. The vertebral arteries are patent within the neck bilaterally. The right vertebral artery is dominant with moderate atherosclerotic narrowing at its origin. Duplicated V1 left vertebral artery. There is at least moderate ostial stenosis at the origin of the medial left V1 vertebral artery branch. 3. The partially imaged lung apices demonstrate findings suspicious for interstitial lung disease. Additionally, there are nodular opacities within the right lung apex measuring up to 1.4 cm. Dedicated chest CT is recommended for further evaluation when clinically feasible. CTA head: 1. 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 There is minimal reconstitution  of flow within M2 and more distal right MCA branch vessels. 2. No other intracranial large vessel occlusion or proximal high-grade arterial stenosis identified. CT perfusion head: The perfusion software identifies a 10 mL core infarct within the right MCA vascular territory. However, please note more extensive infarction changes were appreciated on concurrently performed noncontrast head CT. The perfusion software detects a 147 mL region of hypoperfused parenchyma within the right MCA vascular territory.  Reported mismatch 137 mL. Electronically Signed   By: Kellie Simmering DO   On: 04/17/2019 11:04   CT HEAD WO CONTRAST  Result Date: 04/17/2019 CLINICAL DATA:  Follow-up intervention for right MCA occlusion. EXAM: CT HEAD WITHOUT CONTRAST TECHNIQUE: Contiguous axial images were obtained from the base of the skull through the vertex without intravenous contrast. COMPARISON:  Interventional images same day.  Head CT same day. FINDINGS: Brain: In the region the small core infarction in the lateral temporal lobe and the penumbra of the majority of the right middle cerebral artery cortical territory, there is brain swelling with effacement of the sulci and areas of patchy low-density as well as some areas of increased attenuation. Low-density present within the right basal ganglia. The appearance of the increased attenuation is more consistent with post procedural contrast staining than intraparenchymal hemorrhage. Hemorrhage is not excluded but not favored. Certainly, there is no frank hematoma. No evidence of midline shift. No hydrocephalus. No extra-axial collection. Vascular: No acute vascular finding primarily. Skull: Negative Sinuses/Orbits: Clear except for mild mucosal thickening. Other: None IMPRESSION: Ischemic changes in the right MCA territory affecting the cortical brain, subcortical brain and right basal ganglia. Areas of low-density as well as areas of indistinct higher density favored to represent contrast staining rather than frank hemorrhage. Low level petechial bleeding is not excluded. There is no discrete hematoma. No mass effect or shift, despite mild-to-moderate swelling in the region of affected brain. Electronically Signed   By: Nelson Chimes M.D.   On: 04/17/2019 20:35   CT ANGIO NECK W OR WO CONTRAST  Result Date: 04/17/2019 CLINICAL DATA:  Stroke, follow-up. EXAM: CT ANGIOGRAPHY HEAD AND NECK CT PERFUSION BRAIN TECHNIQUE: Multidetector CT imaging of the head and neck was performed using the  standard protocol during bolus administration of intravenous contrast. Multiplanar CT image reconstructions and MIPs were obtained to evaluate the vascular anatomy. Carotid stenosis measurements (when applicable) are obtained utilizing NASCET criteria, using the distal internal carotid diameter as the denominator. Multiphase CT imaging of the brain was performed following IV bolus contrast injection. Subsequent parametric perfusion maps were calculated using RAPID software. CONTRAST:  166mL OMNIPAQUE IOHEXOL 350 MG/ML SOLN COMPARISON:  Noncontrast head CT performed earlier the same day. FINDINGS: CTA NECK FINDINGS Aortic arch: The origins of the innominate and left common carotid arteries are incompletely imaged. The visualized aortic arch is unremarkable. No significant innominate or proximal subclavian stenosis visualized. Right carotid system: CCA patent to the bifurcation without stenosis. Prominent predominantly calcified plaque within the distal common carotid artery and proximal ICA. There is likely greater than 70% stenosis at the origin of the ICA, although exact quantification of stenosis is difficult due to irregularity of calcified plaque. Distal to this the ICA is patent within the neck without stenosis. Left carotid system: CCA patent to the bifurcation without stenosis. Calcified plaque within the distal common carotid artery, carotid bifurcation and proximal ICA. Resultant 30-40% narrowing of the proximal ICA as compared to the more distal vessel. Distal to this the ICA is patent within the neck  without stenosis. Vertebral arteries: Right vertebral artery dominant. 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 Calcified plaque at the origin of the medial V1 left vertebral artery branch with at least moderate ostial stenosis. More distally the bilateral vertebral arteries are patent within the neck without stenosis. Skeleton: No acute bony abnormality. Cervical spondylosis with C5-C6 and C6-C7 posterior  disc osteophytes. Other neck: No neck mass or cervical adenopathy. Upper chest: Paraseptal emphysema within the imaged lung apices. Also within the imaged lung apices but there is interstitial prominence suspicious for interstitial lung disease. There are more nodular opacities within the right upper lobe measuring up to 1.4 cm (series 7, image 56) (series 7, image 99). Review of the MIP images confirms the above findings CTA HEAD FINDINGS Anterior circulation: The intracranial internal carotid arteries are patent with scattered calcified plaque. No more than mild stenosis within these vessels. 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 There is minimal reconstitution of flow seen within M2 and more distal right MCA branch vessels. The left middle cerebral artery is patent without high-grade proximal stenosis. The bilateral anterior cerebral arteries are patent without significant proximal stenosis. Posterior circulation: The intracranial vertebral arteries are patent without significant stenosis, as is the basilar artery. The bilateral posterior cerebral arteries are patent without significant proximal stenosis. A sizable right posterior communicating artery is present. The a left posterior communicating artery is poorly delineated and may be hypoplastic or absent. Venous sinuses: Within limitations of contrast timing, no convincing thrombus. Anatomic variants: As described Review of the MIP images confirms the above findings CT Brain Perfusion Findings: ASPECTS: 4 CBF (<30%) Volume: 2mL (in the right MCA vascular territory) Perfusion (Tmax>6.0s) volume: 147 mL (in the right MCA vascular territory) Mismatch Volume: 163mL Infarction Location:Right MCA vascular territory Findings of a proximal M1 right MCA occlusion and the perfusion results were discussed by telephone at the time of interpretation on 04/17/2019 at 10:38 am to provider Dr. Lorraine Lax, who verbally acknowledged these results. IMPRESSION: CTA  neck: 1. The common and internal carotid arteries are patent within the neck bilaterally. 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 There is likely greater than 70% stenosis at the origin of the right ICA. 30-40% stenosis of the proximal left ICA. 2. The vertebral arteries are patent within the neck bilaterally. The right vertebral artery is dominant with moderate atherosclerotic narrowing at its origin. Duplicated V1 left vertebral artery. There is at least moderate ostial stenosis at the origin of the medial left V1 vertebral artery branch. 3. The partially imaged lung apices demonstrate findings suspicious for interstitial lung disease. Additionally, there are nodular opacities within the right lung apex measuring up to 1.4 cm. Dedicated chest CT is recommended for further evaluation when clinically feasible. CTA head: 1. 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 There is minimal reconstitution of flow within M2 and more distal right MCA branch vessels. 2. No other intracranial large vessel occlusion or proximal high-grade arterial stenosis identified. CT perfusion head: The perfusion software identifies a 10 mL core infarct within the right MCA vascular territory. However, please note more extensive infarction changes were appreciated on concurrently performed noncontrast head CT. The perfusion software detects a 147 mL region of hypoperfused parenchyma within the right MCA vascular territory. Reported mismatch 137 mL. Electronically Signed   By: Kellie Simmering DO   On: 04/17/2019 11:04   MR BRAIN WO CONTRAST  Result Date: 04/17/2019 CLINICAL DATA:  Focal neuro deficit, greater than  6 hours, stroke suspected. EXAM: MRI HEAD WITHOUT CONTRAST TECHNIQUE: Multiplanar, multiecho pulse sequences of the brain and surrounding structures were obtained without intravenous contrast. COMPARISON:  Noncontrast CT head, CT angiogram head/neck and CT perfusion performed earlier the same  day 04/17/2019 FINDINGS: Brain: A limited protocol brain MRI was performed at the ordering provider's request consisting of only axial diffusion-weighted imaging and axial T2 FLAIR imaging. There is extensive multifocal restricted diffusion within the right MCA vascular territory, consistent with acute ischemic changes and involving portions of the right frontal, parietal, temporal and occipital lobes as well as right insular cortex, posterior limb of right internal capsule, right lentiform nucleus, right caudate body and right corona radiata. Of note, this includes acute ischemic change within the right motor strip. The largest region of diffusion weighted signal abnormality within the right temporal lobe measures 7.8 x 3.0 cm in transaxial dimensions (series 4, image 23). There is T2/FLAIR hyperintensity which corresponds with some, but not all, of the acute ischemic change. For instance, there is little to no T2/FLAIR hyperintensity corresponding with the ischemic changes within the right corona radiata, basal ganglia and right insula at this time. Punctate acute cortical infarct within the left parietooccipital lobe (series 4, image 33). Additional probable punctate acute infarcts more superiorly within the left parietal lobe (series 4, image 35) and within the lateral left occipital lobe (series 4, image 24). Vascular: Poorly assessed on the acquired sequences. Skull and upper cervical spine: No evidence of focal marrow lesion on the acquired sequences. Sinuses/Orbits: Visualized orbits demonstrate no acute abnormality. Minimal ethmoid sinus mucosal thickening. No significant mastoid effusion. IMPRESSION: Extensive restricted diffusion consistent with acute ischemia involving the right MCA vascular territory. There is T2/FLAIR hyperintensity which corresponds with some, but not all, of the acute ischemic changes as described. Additional punctate acute infarcts within the left parietal and occipital lobes.  Electronically Signed   By: Kellie Simmering DO   On: 04/17/2019 12:50   IR CT Head Ltd  Result Date: 04/17/2019 INDICATION: New onset of left-sided weakness with right gaze deviation and dysarthria.  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  EXAM: 1. EMERGENT LARGE VESSEL OCCLUSION THROMBOLYSIS (anterior CIRCULATION)  COMPARISON:  CT angiogram of the head and neck of April 17, 2019.  MEDICATIONS: Ancef 2 g IV antibiotic was administered within 1 hour of the procedure.  ANESTHESIA/SEDATION: General anesthesia.  CONTRAST:  Isovue 300 approximately 65 mL.  FLUOROSCOPY TIME:  Fluoroscopy Time: 41 minutes 18 seconds (1531 mGy).  COMPLICATIONS: None immediate.  TECHNIQUE: Following a full explanation of the procedure along with the potential associated complications, an informed witnessed consent was obtained from the patient's wife and daughter. The risks of intracranial hemorrhage of 10%, worsening neurological deficit, ventilator dependency, death and inability to revascularize were all reviewed in detail with the patient's wife and daughter.  The patient was then put under general anesthesia by the Department of Anesthesiology at [facility].  The right groin was prepped and draped in the usual sterile fashion. Thereafter using modified Seldinger technique, transfemoral access into the right common femoral artery was obtained without difficulty. Over a 0.035 inch guidewire an 8 [place] Pinnacle sheath was inserted. Through this, and also over a 0.035 inch guidewire a 5 [place] JB 1 catheter was advanced to the aortic arch region and selectively positioned in the innominate artery and the right common carotid artery.  FINDINGS: The innominate artery arteriogram demonstrates the origin of the right subclavian artery  and the right common carotid artery to be widely patent.  The right common carotid arteriogram demonstrates the right external carotid artery  and its major branches to be widely patent.  The right internal carotid artery at the bulb to the cranial skull base demonstrates wide patency with mild FMD-like changes involving the mid cervical right ICA.  The petrous, cavernous and the supraclinoid segments are widely patent.  A right posterior communicating artery is seen opacifying the right posterior cerebral distribution.  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 The right anterior cerebral artery opacifies into the capillary and venous phases.  PROCEDURE: The diagnostic JB 1 catheter in right common carotid artery was exchanged over an 8 [place] 300 cm Rosen exchange guidewire for an 087 balloon guide catheter which had been prepped with 50% contrast and 50% heparinized saline infusion. This was advanced to the right common carotid artery just proximal to the origin of the right internal carotid artery. The guidewire was removed. Good aspiration was obtained from the hub of the balloon guide catheter.  Gentle contrast injection demonstrated no evidence of spasms or of intraluminal filling defects. Over a 0.014 inch standard Synchro micro guidewire with a J configuration, the combination of the 021 Trevo ProVue microcatheter inside of a 6 [place] Catalyst 132 cm guide catheter was advanced to the distal end of the balloon guide catheter.  With the micro guidewire leading with a J-tip configuration to avoid dissections or inducing spasm, the combination was navigated without difficulty to the supraclinoid right ICA.  The occluded right middle cerebral artery was entered with a micro guidewire which was advanced to the M3 regions of the inferior division of the right middle cerebral artery followed by the microcatheter. The guidewire was removed. Good aspiration was obtained from the hub of the microcatheter. Gentle contrast injection demonstrated safe position of tip of the microcatheter. This was then connected to  continuous heparinized saline infusion.  A 4 mm x 40 mm Solitaire X retrieval device was then advanced to the distal end of the microcatheter. The O ring on the delivery microcatheter was then loosened. With slight forward gentle traction with the right hand on the delivery micro guidewire with left hand the delivery microcatheter was retrieved unsheathing the retrieval device.  The Catalyst 6 [place] guide catheter was advanced to the occluded right middle cerebral artery. With proximal flow arrest obtained by inflating the balloon in the right internal carotid artery, and after constant aspiration using a Penumbra aspiration device at the hub of the 6 [place] Catalyst guide catheter, and with a 60 mL syringe at the hub of the balloon guide catheter, the combination of the retrieval device, the microcatheter, and the 6 [place] Catalyst guide were retrieved and removed as constant aspiration was applied at the hub of the balloon guide catheter.  Proximal flow arrest was reversed. A control arteriogram performed through the balloon guide catheter which had been advanced into the mid right internal carotid artery demonstrated complete opacification of the right middle cerebral artery and the inferior division. However, the superior division remained occluded.  A second pass was then made again using the above combination. With the micro guidewire leading, access into the M3 region of the superior division was obtained with a microcatheter. The micro guidewire was removed. Good aspiration was established at the hub of the microcatheter. A gentle control arteriogram again demonstrates safe positioning of the tip of the microcatheter which was then connected to continuous  heparinized saline infusion. Again with constant flow arrest in the right internal carotid artery, and aspiration at the hub of the Catalyst guide catheter using the Penumbra aspiration device at the origin of the superior division for 2 minutes, and  with a 60 mL syringe at the hub of the balloon guide catheter, the combination of the retrieval device, the microcatheter and the 6 [place] Catalyst guide catheter were retrieved and removed. Following reversal of flow arrest, control arteriogram performed through the balloon guide catheter in the right internal carotid artery now demonstrated revascularization of the superior division and also further improved caliber of the inferior division.  There continued to be a very small branch extending into the anterior temporal region which was occluded in the M2 region. Also noted was a distal M3 M4 junction branch occlusion of a parietal branch of the inferior division.  A TICI 2C revascularization had been achieved.  The right anterior cerebral artery remained widely patent with cross-filling via the anterior communicating artery of the left anterior cerebral A2 segment and distally.  The posterior communicating artery remain widely patent.  Moderate spasm in the distal cervical right ICA, and the superior division of the right middle cerebral artery responded to 2 aliquots of 25 mcg of nitroglycerin intra-arterially.  A final control arteriogram performed through the balloon guide catheter in the right common carotid artery demonstrated significantly improved caliber of the right internal carotid artery, with maintenance of a TICI 2c revascularization of the right middle cerebral artery distribution.  The balloon guide catheter was removed. The right groin 8 French Pinnacle sheath was then removed with manual compression in the right groin. Distal pulses remained Dopplerable in the dorsalis pedis, and the posterior tibial regions bilaterally unchanged.  A flat panel CT of the brain demonstrated heterogeneous hyper attenuation in the right temporal lobe region anteriorly probably representing contrast stain versus less likely hemorrhagic conversion of infarct tissue.  There was no mass-effect noted on the  right lateral ventricle. Patient was then transferred to the ICU unit for post thrombectomy management. Patient was left intubated on account of his COVID status.  IMPRESSION: Endovascular near complete revascularization of the right middle cerebral artery territory with 2 passes with the 4 mm x 40 mm Solitaire X retrieval device and Penumbra aspiration achieving a TICI 2c revascularization.  PLAN: Follow-up as per referring MD.   Electronically Signed   By: Luanne Bras M.D.   On: 04/18/2019 10:42   CT CEREBRAL PERFUSION W CONTRAST  Result Date: 04/17/2019 CLINICAL DATA:  Stroke, follow-up. EXAM: CT ANGIOGRAPHY HEAD AND NECK CT PERFUSION BRAIN TECHNIQUE: Multidetector CT imaging of the head and neck was performed using the standard protocol during bolus administration of intravenous contrast. Multiplanar CT image reconstructions and MIPs were obtained to evaluate the vascular anatomy. Carotid stenosis measurements (when applicable) are obtained utilizing NASCET criteria, using the distal internal carotid diameter as the denominator. Multiphase CT imaging of the brain was performed following IV bolus contrast injection. Subsequent parametric perfusion maps were calculated using RAPID software. CONTRAST:  140mL OMNIPAQUE IOHEXOL 350 MG/ML SOLN COMPARISON:  Noncontrast head CT performed earlier the same day. FINDINGS: CTA NECK FINDINGS Aortic arch: The origins of the innominate and left common carotid arteries are incompletely imaged. The visualized aortic arch is unremarkable. No significant innominate or proximal subclavian stenosis visualized. Right carotid system: CCA patent to the bifurcation without stenosis. Prominent predominantly calcified plaque within the distal common carotid artery and proximal ICA. There is likely greater than  70% stenosis at the origin of the ICA, although exact quantification of stenosis is difficult due to irregularity of calcified plaque. Distal to this the ICA is  patent within the neck without stenosis. Left carotid system: CCA patent to the bifurcation without stenosis. Calcified plaque within the distal common carotid artery, carotid bifurcation and proximal ICA. Resultant 30-40% narrowing of the proximal ICA as compared to the more distal vessel. Distal to this the ICA is patent within the neck without stenosis. Vertebral arteries: Right vertebral artery dominant. 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 Calcified plaque at the origin of the medial V1 left vertebral artery branch with at least moderate ostial stenosis. More distally the bilateral vertebral arteries are patent within the neck without stenosis. Skeleton: No acute bony abnormality. Cervical spondylosis with C5-C6 and C6-C7 posterior disc osteophytes. Other neck: No neck mass or cervical adenopathy. Upper chest: Paraseptal emphysema within the imaged lung apices. Also within the imaged lung apices but there is interstitial prominence suspicious for interstitial lung disease. There are more nodular opacities within the right upper lobe measuring up to 1.4 cm (series 7, image 56) (series 7, image 99). Review of the MIP images confirms the above findings CTA HEAD FINDINGS Anterior circulation: The intracranial internal carotid arteries are patent with scattered calcified plaque. No more than mild stenosis within these vessels. 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 There is minimal reconstitution of flow seen within M2 and more distal right MCA branch vessels. The left middle cerebral artery is patent without high-grade proximal stenosis. The bilateral anterior cerebral arteries are patent without significant proximal stenosis. Posterior circulation: The intracranial vertebral arteries are patent without significant stenosis, as is the basilar artery. The bilateral posterior cerebral arteries are patent without significant proximal stenosis. A sizable right posterior communicating artery is  present. The a left posterior communicating artery is poorly delineated and may be hypoplastic or absent. Venous sinuses: Within limitations of contrast timing, no convincing thrombus. Anatomic variants: As described Review of the MIP images confirms the above findings CT Brain Perfusion Findings: ASPECTS: 4 CBF (<30%) Volume: 56mL (in the right MCA vascular territory) Perfusion (Tmax>6.0s) volume: 147 mL (in the right MCA vascular territory) Mismatch Volume: 164mL Infarction Location:Right MCA vascular territory Findings of a proximal M1 right MCA occlusion and the perfusion results were discussed by telephone at the time of interpretation on 04/17/2019 at 10:38 am to provider Dr. Lorraine Lax, who verbally acknowledged these results. IMPRESSION: CTA neck: 1. The common and internal carotid arteries are patent within the neck bilaterally. 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 There is likely greater than 70% stenosis at the origin of the right ICA. 30-40% stenosis of the proximal left ICA. 2. The vertebral arteries are patent within the neck bilaterally. The right vertebral artery is dominant with moderate atherosclerotic narrowing at its origin. Duplicated V1 left vertebral artery. There is at least moderate ostial stenosis at the origin of the medial left V1 vertebral artery branch. 3. The partially imaged lung apices demonstrate findings suspicious for interstitial lung disease. Additionally, there are nodular opacities within the right lung apex measuring up to 1.4 cm. Dedicated chest CT is recommended for further evaluation when clinically feasible. CTA head: 1. 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 There is minimal reconstitution of flow within M2 and more distal right MCA branch vessels. 2. No other intracranial large vessel occlusion or proximal high-grade arterial stenosis identified. CT perfusion head: The perfusion software  identifies a 10 mL core infarct within the  right MCA vascular territory. However, please note more extensive infarction changes were appreciated on concurrently performed noncontrast head CT. The perfusion software detects a 147 mL region of hypoperfused parenchyma within the right MCA vascular territory. Reported mismatch 137 mL. Electronically Signed   By: Kellie Simmering DO   On: 04/17/2019 11:04   DG Chest Port 1 View  Result Date: 04/18/2019 CLINICAL DATA:  Acute respiratory failure EXAM: PORTABLE CHEST 1 VIEW COMPARISON:  Radiograph 04/17/2019 FINDINGS: *Endotracheal tube in the mid trachea, 5.5 cm from the carina. *Telemetry leads overlie the chest. Slight improvement in the bilateral airspace disease most pronounced in the left base and periphery and right mid lung. No new areas of airspace disease. No pneumothorax or visible effusion the portion of the costophrenic sulci on the left is collimated. Cardiomediastinal contours are stable with a calcified aorta. No acute osseous or soft tissue abnormality. IMPRESSION: Stable satisfactory positioning of the endotracheal tube. Slight interval improvement of bilateral airspace disease. Electronically Signed   By: Lovena Le M.D.   On: 04/18/2019 06:15   DG Chest Port 1 View  Result Date: 04/17/2019 CLINICAL DATA:  COVID-19. Intubation. EXAM: PORTABLE CHEST 1 VIEW COMPARISON:  Radiograph yesterday. FINDINGS: Endotracheal tube tip 5.8 cm from the carina. Probable esophageal temperature probe with tip at the level of the clavicular heads. No definite enteric tube is visualized. Patchy bilateral airspace disease within both lungs, slight worsening in the right midlung zone from prior. Unchanged heart size and mediastinal contours. No pneumothorax or large pleural effusion. No acute osseous abnormalities are seen. IMPRESSION: 1. Bilateral pneumonia, with slight worsening in the right mid lung since yesterday. 2. Endotracheal tube tip 5.8 cm from the carina. Probable esophageal temperature probe with tip  at the clavicular heads. Electronically Signed   By: Keith Rake M.D.   On: 04/17/2019 16:45   IR PERCUTANEOUS ART THROMBECTOMY/INFUSION INTRACRANIAL INC DIAG ANGIO  Result Date: 04/19/2019 INDICATION: New onset of left-sided weakness with right gaze deviation and dysarthria. 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 EXAM: 1. EMERGENT LARGE VESSEL OCCLUSION THROMBOLYSIS (anterior CIRCULATION) COMPARISON:  CT angiogram of the head and neck of April 17, 2019. MEDICATIONS: Ancef 2 g IV antibiotic was administered within 1 hour of the procedure. ANESTHESIA/SEDATION: General anesthesia. CONTRAST:  Isovue 300 approximately 65 mL. FLUOROSCOPY TIME:  Fluoroscopy Time: 41 minutes 18 seconds (1531 mGy). COMPLICATIONS: None immediate. TECHNIQUE: Following a full explanation of the procedure along with the potential associated complications, an informed witnessed consent was obtained from the patient's wife and daughter. The risks of intracranial hemorrhage of 10%, worsening neurological deficit, ventilator dependency, death and inability to revascularize were all reviewed in detail with the patient's wife and daughter. The patient was then put under general anesthesia by the Department of Anesthesiology at [facility]. The right groin was prepped and draped in the usual sterile fashion. Thereafter using modified Seldinger technique, transfemoral access into the right common femoral artery was obtained without difficulty. Over a 0.035 inch guidewire an 8 [place] Pinnacle sheath was inserted. Through this, and also over a 0.035 inch guidewire a 5 [place] JB 1 catheter was advanced to the aortic arch region and selectively positioned in the innominate artery and the right common carotid artery. FINDINGS: The innominate artery arteriogram demonstrates the origin of the right subclavian artery and the right common carotid artery to be widely patent. The right common carotid  arteriogram demonstrates  the right external carotid artery and its major branches to be widely patent. The right internal carotid artery at the bulb to the cranial skull base demonstrates wide patency with mild FMD-like changes involving the mid cervical right ICA. The petrous, cavernous and the supraclinoid segments are widely patent. A right posterior communicating artery is seen opacifying the right posterior cerebral distribution. 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 The right anterior cerebral artery opacifies into the capillary and venous phases. PROCEDURE: The diagnostic JB 1 catheter in right common carotid artery was exchanged over an 8 [place] 300 cm Rosen exchange guidewire for an 087 balloon guide catheter which had been prepped with 50% contrast and 50% heparinized saline infusion. This was advanced to the right common carotid artery just proximal to the origin of the right internal carotid artery. The guidewire was removed. Good aspiration was obtained from the hub of the balloon guide catheter. Gentle contrast injection demonstrated no evidence of spasms or of intraluminal filling defects. Over a 0.014 inch standard Synchro micro guidewire with a J configuration, the combination of the 021 Trevo ProVue microcatheter inside of a 6 [place] Catalyst 132 cm guide catheter was advanced to the distal end of the balloon guide catheter. With the micro guidewire leading with a J-tip configuration to avoid dissections or inducing spasm, the combination was navigated without difficulty to the supraclinoid right ICA. The occluded right middle cerebral artery was entered with a micro guidewire which was advanced to the M3 regions of the inferior division of the right middle cerebral artery followed by the microcatheter. The guidewire was removed. Good aspiration was obtained from the hub of the microcatheter. Gentle contrast injection demonstrated safe position of tip of the  microcatheter. This was then connected to continuous heparinized saline infusion. A 4 mm x 40 mm Solitaire X retrieval device was then advanced to the distal end of the microcatheter. The O ring on the delivery microcatheter was then loosened. With slight forward gentle traction with the right hand on the delivery micro guidewire with left hand the delivery microcatheter was retrieved unsheathing the retrieval device. The Catalyst 6 [place] guide catheter was advanced to the occluded right middle cerebral artery. With proximal flow arrest obtained by inflating the balloon in the right internal carotid artery, and after constant aspiration using a Penumbra aspiration device at the hub of the 6 [place] Catalyst guide catheter, and with a 60 mL syringe at the hub of the balloon guide catheter, the combination of the retrieval device, the microcatheter, and the 6 [place] Catalyst guide were retrieved and removed as constant aspiration was applied at the hub of the balloon guide catheter. Proximal flow arrest was reversed. A control arteriogram performed through the balloon guide catheter which had been advanced into the mid right internal carotid artery demonstrated complete opacification of the right middle cerebral artery and the inferior division. However, the superior division remained occluded. A second pass was then made again using the above combination. With the micro guidewire leading, access into the M3 region of the superior division was obtained with a microcatheter. The micro guidewire was removed. Good aspiration was established at the hub of the microcatheter. A gentle control arteriogram again demonstrates safe positioning of the tip of the microcatheter which was then connected to continuous heparinized saline infusion. Again with constant flow arrest in the right internal carotid artery, and aspiration at the hub of the Catalyst guide catheter using the Penumbra aspiration device at  the origin of the  superior division for 2 minutes, and with a 60 mL syringe at the hub of the balloon guide catheter, the combination of the retrieval device, the microcatheter and the 6 [place] Catalyst guide catheter were retrieved and removed. Following reversal of flow arrest, control arteriogram performed through the balloon guide catheter in the right internal carotid artery now demonstrated revascularization of the superior division and also further improved caliber of the inferior division. There continued to be a very small branch extending into the anterior temporal region which was occluded in the M2 region. Also noted was a distal M3 M4 junction branch occlusion of a parietal branch of the inferior division. A TICI 2C revascularization had been achieved. The right anterior cerebral artery remained widely patent with cross-filling via the anterior communicating artery of the left anterior cerebral A2 segment and distally. The posterior communicating artery remain widely patent. Moderate spasm in the distal cervical right ICA, and the superior division of the right middle cerebral artery responded to 2 aliquots of 25 mcg of nitroglycerin intra-arterially. A final control arteriogram performed through the balloon guide catheter in the right common carotid artery demonstrated significantly improved caliber of the right internal carotid artery, with maintenance of a TICI 2c revascularization of the right middle cerebral artery distribution. The balloon guide catheter was removed. The right groin 8 French Pinnacle sheath was then removed with manual compression in the right groin. Distal pulses remained Dopplerable in the dorsalis pedis, and the posterior tibial regions bilaterally unchanged. A flat panel CT of the brain demonstrated heterogeneous hyper attenuation in the right temporal lobe region anteriorly probably representing contrast stain versus less likely hemorrhagic conversion of infarct tissue. There was no  mass-effect noted on the right lateral ventricle. Patient was then transferred to the ICU unit for post thrombectomy management. Patient was left intubated on account of his COVID status. IMPRESSION: Endovascular near complete revascularization of the right middle cerebral artery territory with 2 passes with the 4 mm x 40 mm Solitaire X retrieval device and Penumbra aspiration achieving a TICI 2c revascularization. PLAN: Follow-up as per referring MD. Electronically Signed   By: Luanne Bras M.D.   On: 04/18/2019 10:42   CT HEAD CODE STROKE WO CONTRAST  Result Date: 04/17/2019 CLINICAL DATA:  Code stroke. Left-sided facial droop/weakness, last known well noon yesterday EXAM: CT HEAD WITHOUT CONTRAST TECHNIQUE: Contiguous axial images were obtained from the base of the skull through the vertex without intravenous contrast. COMPARISON:  No pertinent prior studies available for comparison. 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 Subtle petechial hemorrhage is questioned within a region of infarction within the anterior right frontal lobe (series 2, image 19). No significant mass effect or midline shift. No extra-axial fluid collection. Background mild ill-defined hypoattenuation within the cerebral white matter is nonspecific, but consistent with chronic small vessel ischemic disease. Mild generalized parenchymal atrophy. Vascular: Abnormal hyperdensity of the M1 right MCA likely reflecting thrombus. Atherosclerotic calcifications. Skull: No calvarial fracture or suspicious osseous lesion. Sinuses/Orbits: Visualized orbits demonstrate no acute abnormality. Mild paranasal sinus mucosal thickening. No significant mastoid effusion. ASPECTS [facility] Stroke Program Early CT Score) -  Ganglionic level infarction (caudate, lentiform nuclei, internal capsule, insula, M1-M3 cortex): 4 (points subtracted for M2, M3 and insula) - Supraganglionic infarction (M4-M6 cortex): 0 Total score (0-10  with 10 being normal): 4 These results were communicated to Dr. Lorraine Lax At 10:38 amon 2/10/2021by text page via the [facility] messaging system. IMPRESSION: 1. 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 ASPECTS 4. Subtle petechial hemorrhage is questioned within an anterior right frontal lobe infarct site. No significant mass effect. 2. Hyperdensity of the M1 right MCA likely reflecting thrombus. 3. Mild generalized parenchymal atrophy and chronic small vessel ischemic disease. Electronically Signed   By: Kellie Simmering DO   On: 04/17/2019 10:38   VAS [place] LOWER EXTREMITY VENOUS (DVT)  Result Date: 04/18/2019  Lower Venous DVTStudy Indications: Elevated Ddimer.  Risk Factors: COVID 19 positive. Limitations: Bandages, open wound and patient positioning. Comparison Study: No prior studies. Performing Technologist: Oliver Hum RVT  Examination Guidelines: A complete evaluation includes B-mode imaging, spectral Doppler, color Doppler, and power Doppler as needed of all accessible portions of each vessel. Bilateral testing is considered an integral part of a complete examination. Limited examinations for reoccurring indications may be performed as noted. The reflux portion of the exam is performed with the patient in reverse Trendelenburg.  +---------+---------------+---------+-----------+----------+--------------+ RIGHT    CompressibilityPhasicitySpontaneityPropertiesThrombus Aging +---------+---------------+---------+-----------+----------+--------------+ CFV                                                   Not visualized +---------+---------------+---------+-----------+----------+--------------+ SFJ                                                   Not  visualized +---------+---------------+---------+-----------+----------+--------------+ FV Prox  Full           Yes      Yes                                 +---------+---------------+---------+-----------+----------+--------------+ FV Mid   Full                                                        +---------+---------------+---------+-----------+----------+--------------+ FV DistalFull                                                        +---------+---------------+---------+-----------+----------+--------------+ PFV      Full                                                        +---------+---------------+---------+-----------+----------+--------------+ POP      Full           Yes      Yes                                 +---------+---------------+---------+-----------+----------+--------------+  PTV      Full                                                        +---------+---------------+---------+-----------+----------+--------------+ PERO     Full                                                        +---------+---------------+---------+-----------+----------+--------------+ Unable to visualize the CFV, SFJ due to bandages.  +---------+---------------+---------+-----------+----------+--------------+ LEFT     CompressibilityPhasicitySpontaneityPropertiesThrombus Aging +---------+---------------+---------+-----------+----------+--------------+ CFV      Full           Yes      Yes                                 +---------+---------------+---------+-----------+----------+--------------+ SFJ      Full                                                        +---------+---------------+---------+-----------+----------+--------------+ FV Prox  Full                                                        +---------+---------------+---------+-----------+----------+--------------+ FV Mid   Full                                                         +---------+---------------+---------+-----------+----------+--------------+ FV DistalFull                                                        +---------+---------------+---------+-----------+----------+--------------+ PFV      Full                                                        +---------+---------------+---------+-----------+----------+--------------+ POP      Full           Yes      Yes                                 +---------+---------------+---------+-----------+----------+--------------+ PTV      Full                                                        +---------+---------------+---------+-----------+----------+--------------+  PERO     Full                                                        +---------+---------------+---------+-----------+----------+--------------+ Gastroc  None                                         Acute          +---------+---------------+---------+-----------+----------+--------------+     Summary: RIGHT: - There is no evidence of deep vein thrombosis in the lower extremity.  - No cystic structure found in the popliteal fossa.  LEFT: - Findings consistent with acute deep vein thrombosis involving the left gastrocnemius veins. - No cystic structure found in the popliteal fossa.  *See table(s) above for measurements and observations. Electronically signed by Servando Snare MD on 04/18/2019 at 6:33:22 PM.    Final    ECHOCARDIOGRAM LIMITED  Result Date: 04/18/2019    ECHOCARDIOGRAM LIMITED REPORT   Patient Name:   Henry [name] Date of Exam: 04/17/2019 Medical Rec #:  [id number]       Height:       68.0 in Accession #:    [id number]      Weight:       180.0 lb Date of Birth:  12/20/1940       BSA:          1.95 m Patient Age:    44 years        BP:           141/51 mmHg Patient Gender: M               HR:           57 bpm. Exam Location:  Inpatient Procedure: Limited Echo and Cardiac Doppler Indications:    Stroke  434.91 / I163.9  History:        Patient has no prior history of Echocardiogram examinations.                 Signs/Symptoms:Chest Pain; Risk Factors:Hypertension. Pneumonia                 due to COVID-19 virus.  Sonographer:    Jaquita Folds Referring Phys: [place]  1. Left ventricular ejection fraction, by estimation, is 55 to 60%. The left ventricle has normal function. The left ventrical has no regional wall motion abnormalities. Left ventricular diastolic parameters are indeterminate.  2. Right ventricular systolic function is normal. The right ventricular size is normal. There is normal pulmonary artery systolic pressure.  3. The mitral valve is normal in structure and function. trivial mitral valve regurgitation. No evidence of mitral stenosis.  4. The aortic valve is normal in structure and function. Aortic valve regurgitation is not visualized. No aortic stenosis is present. FINDINGS  Left Ventricle: Left ventricular ejection fraction, by estimation, is 55 to 60%. The left ventricle has normal function. The left ventricle has no regional wall motion abnormalities. There is no left ventricular hypertrophy. Right Ventricle: The right ventricular size is normal. No increase in right ventricular wall thickness. Right ventricular systolic function is normal. There is normal pulmonary artery systolic pressure. The tricuspid regurgitant velocity is 1.86 m/s, and  with  an assumed right atrial pressure of 3 mmHg, the estimated right ventricular systolic pressure is 123XX123 mmHg. Mitral Valve: The mitral valve is normal in structure and function. Trivial mitral valve regurgitation. No evidence of mitral valve stenosis. Tricuspid Valve: The tricuspid valve is grossly normal. Tricuspid valve regurgitation is mild. Aortic Valve: The aortic valve is normal in structure and function. Aortic valve regurgitation is not visualized. No aortic stenosis is present. Aorta: The aortic root, ascending  aorta and aortic arch are all structurally normal, with no evidence of dilitation or obstruction.  LEFT VENTRICLE PLAX 2D LVIDd:         4.60 cm  Diastology LVIDs:         3.60 cm  LV e' lateral:   8.04 cm/s LV PW:         0.90 cm  LV E/e' lateral: 10.1 LV IVS:        0.90 cm  LV e' medial:    5.91 cm/s LVOT diam:     2.00 cm  LV E/e' medial:  13.8 LV SV:         55.61 ml LV SV Index:   21.50 LVOT Area:     3.14 cm  LEFT ATRIUM             Index LA diam:        4.30 cm 2.20 cm/m LA Vol (A2C):   50.7 ml 25.94 ml/m LA Vol (A4C):   43.8 ml 22.41 ml/m LA Biplane Vol: 48.7 ml 24.92 ml/m  AORTIC VALVE LVOT Vmax:   79.40 cm/s LVOT Vmean:  57.900 cm/s LVOT VTI:    0.177 m  AORTA Ao Root diam: 2.70 cm MITRAL VALVE                        TRICUSPID VALVE MV Area (PHT): 2.50 cm             TR Peak grad:   13.8 mmHg MV Decel Time: 303 msec             TR Vmax:        186.00 cm/s MV E velocity: 81.60 cm/s 103 cm/s MV A velocity: 74.40 cm/s 70.3 cm/s SHUNTS MV E/A ratio:  1.10       1.5       Systemic VTI:  0.18 m                                     Systemic Diam: 2.00 cm Mertie Moores MD Electronically signed by Mertie Moores MD Signature Date/Time: 04/18/2019/5:07:36 PM    Final     Assessment & Plan:    Walker Kehr, MD

## 2019-08-08 NOTE — Assessment & Plan Note (Signed)
On saw palmetto; off finasteride per wife

## 2019-08-08 NOTE — Assessment & Plan Note (Signed)
Ok to get vaccinated now

## 2019-08-08 NOTE — Assessment & Plan Note (Signed)
Diltiazem ?hair loss - may need to switch

## 2019-08-08 NOTE — Patient Outreach (Signed)
Telephone outreach to patient's caregiver to obtain mRS was successfully completed. MRS=3  Ina Homes [facility] Management Assistant [phone]

## 2019-08-09 ENCOUNTER — Telehealth: Payer: Self-pay

## 2019-08-09 DIAGNOSIS — D751 Secondary polycythemia: Secondary | ICD-10-CM | POA: Diagnosis not present

## 2019-08-09 DIAGNOSIS — R1312 Dysphagia, oropharyngeal phase: Secondary | ICD-10-CM | POA: Diagnosis not present

## 2019-08-09 DIAGNOSIS — H5462 Unqualified visual loss, left eye, normal vision right eye: Secondary | ICD-10-CM | POA: Diagnosis not present

## 2019-08-09 DIAGNOSIS — J9601 Acute respiratory failure with hypoxia: Secondary | ICD-10-CM | POA: Diagnosis not present

## 2019-08-09 DIAGNOSIS — I69391 Dysphagia following cerebral infarction: Secondary | ICD-10-CM | POA: Diagnosis not present

## 2019-08-09 DIAGNOSIS — I69354 Hemiplegia and hemiparesis following cerebral infarction affecting left non-dominant side: Secondary | ICD-10-CM | POA: Diagnosis not present

## 2019-08-09 DIAGNOSIS — M1991 Primary osteoarthritis, unspecified site: Secondary | ICD-10-CM | POA: Diagnosis not present

## 2019-08-09 DIAGNOSIS — G8929 Other chronic pain: Secondary | ICD-10-CM | POA: Diagnosis not present

## 2019-08-09 DIAGNOSIS — M545 Low back pain: Secondary | ICD-10-CM | POA: Diagnosis not present

## 2019-08-09 NOTE — Telephone Encounter (Signed)
Fine

## 2019-08-09 NOTE — Telephone Encounter (Signed)
New message    [facility] needs verbal orders   To extended one addition visit for  OT

## 2019-08-09 NOTE — Telephone Encounter (Signed)
Verbal orders given for below.  

## 2019-08-09 NOTE — Telephone Encounter (Signed)
MD is out of the office pls advise../lmb 

## 2019-08-13 ENCOUNTER — Telehealth: Payer: Self-pay | Admitting: Internal Medicine

## 2019-08-13 NOTE — Telephone Encounter (Signed)
    Izora Gala from [facility] requesting last ov note and therapy order be faxed to [facility]. Phone [phone]

## 2019-08-13 NOTE — Telephone Encounter (Signed)
Called Henry [name] there was no answer Left msg RTC need fax#.Marland KitchenAndee Poles

## 2019-08-13 NOTE — Telephone Encounter (Signed)
Henry [name] return call bck she states she only need the referral order faxed to [facility] outpatient. Faxed order to [phone].Marland KitchenJohny Chess

## 2019-08-14 DIAGNOSIS — G8929 Other chronic pain: Secondary | ICD-10-CM | POA: Diagnosis not present

## 2019-08-14 DIAGNOSIS — H5462 Unqualified visual loss, left eye, normal vision right eye: Secondary | ICD-10-CM | POA: Diagnosis not present

## 2019-08-14 DIAGNOSIS — D751 Secondary polycythemia: Secondary | ICD-10-CM | POA: Diagnosis not present

## 2019-08-14 DIAGNOSIS — M545 Low back pain: Secondary | ICD-10-CM | POA: Diagnosis not present

## 2019-08-14 DIAGNOSIS — I69354 Hemiplegia and hemiparesis following cerebral infarction affecting left non-dominant side: Secondary | ICD-10-CM | POA: Diagnosis not present

## 2019-08-14 DIAGNOSIS — R1312 Dysphagia, oropharyngeal phase: Secondary | ICD-10-CM | POA: Diagnosis not present

## 2019-08-14 DIAGNOSIS — J9601 Acute respiratory failure with hypoxia: Secondary | ICD-10-CM | POA: Diagnosis not present

## 2019-08-14 DIAGNOSIS — I69391 Dysphagia following cerebral infarction: Secondary | ICD-10-CM | POA: Diagnosis not present

## 2019-08-14 DIAGNOSIS — M1991 Primary osteoarthritis, unspecified site: Secondary | ICD-10-CM | POA: Diagnosis not present

## 2019-08-15 ENCOUNTER — Telehealth: Payer: Self-pay | Admitting: *Deleted

## 2019-08-15 DIAGNOSIS — D751 Secondary polycythemia: Secondary | ICD-10-CM | POA: Diagnosis not present

## 2019-08-15 DIAGNOSIS — M1991 Primary osteoarthritis, unspecified site: Secondary | ICD-10-CM | POA: Diagnosis not present

## 2019-08-15 DIAGNOSIS — R1312 Dysphagia, oropharyngeal phase: Secondary | ICD-10-CM | POA: Diagnosis not present

## 2019-08-15 DIAGNOSIS — I69391 Dysphagia following cerebral infarction: Secondary | ICD-10-CM | POA: Diagnosis not present

## 2019-08-15 DIAGNOSIS — G8929 Other chronic pain: Secondary | ICD-10-CM | POA: Diagnosis not present

## 2019-08-15 DIAGNOSIS — M545 Low back pain: Secondary | ICD-10-CM | POA: Diagnosis not present

## 2019-08-15 DIAGNOSIS — H5462 Unqualified visual loss, left eye, normal vision right eye: Secondary | ICD-10-CM | POA: Diagnosis not present

## 2019-08-15 DIAGNOSIS — I69354 Hemiplegia and hemiparesis following cerebral infarction affecting left non-dominant side: Secondary | ICD-10-CM | POA: Diagnosis not present

## 2019-08-15 DIAGNOSIS — J9601 Acute respiratory failure with hypoxia: Secondary | ICD-10-CM | POA: Diagnosis not present

## 2019-08-15 DIAGNOSIS — I63413 Cerebral infarction due to embolism of bilateral middle cerebral arteries: Secondary | ICD-10-CM

## 2019-08-15 NOTE — Telephone Encounter (Signed)
OK OT. Thanks

## 2019-08-15 NOTE — Telephone Encounter (Signed)
Rec'd fax stating need order for OT faxed to [facility] Pt has appt 08/20/19 for PT but also needing OT.Marland KitchenJohny [name]

## 2019-08-16 NOTE — Telephone Encounter (Signed)
Lori faxed referral to [facility].Marland KitchenJohny Chess

## 2019-08-16 NOTE — Addendum Note (Signed)
Addended by: Earnstine Regal on: 08/16/2019 09:04 AM   Modules accepted: Orders

## 2019-08-19 DIAGNOSIS — M1991 Primary osteoarthritis, unspecified site: Secondary | ICD-10-CM | POA: Diagnosis not present

## 2019-08-19 DIAGNOSIS — M545 Low back pain: Secondary | ICD-10-CM | POA: Diagnosis not present

## 2019-08-19 DIAGNOSIS — G8929 Other chronic pain: Secondary | ICD-10-CM | POA: Diagnosis not present

## 2019-08-19 DIAGNOSIS — J9601 Acute respiratory failure with hypoxia: Secondary | ICD-10-CM | POA: Diagnosis not present

## 2019-08-19 DIAGNOSIS — I69391 Dysphagia following cerebral infarction: Secondary | ICD-10-CM | POA: Diagnosis not present

## 2019-08-19 DIAGNOSIS — H5462 Unqualified visual loss, left eye, normal vision right eye: Secondary | ICD-10-CM | POA: Diagnosis not present

## 2019-08-19 DIAGNOSIS — R1312 Dysphagia, oropharyngeal phase: Secondary | ICD-10-CM | POA: Diagnosis not present

## 2019-08-19 DIAGNOSIS — I69354 Hemiplegia and hemiparesis following cerebral infarction affecting left non-dominant side: Secondary | ICD-10-CM | POA: Diagnosis not present

## 2019-08-19 DIAGNOSIS — D751 Secondary polycythemia: Secondary | ICD-10-CM | POA: Diagnosis not present

## 2019-08-20 ENCOUNTER — Ambulatory Visit: Payer: Medicare HMO | Admitting: Neurology

## 2019-08-20 ENCOUNTER — Other Ambulatory Visit: Payer: Self-pay

## 2019-08-20 ENCOUNTER — Encounter: Payer: Self-pay | Admitting: Neurology

## 2019-08-20 VITALS — BP 120/74 | HR 69 | Ht 68.0 in | Wt 148.0 lb

## 2019-08-20 DIAGNOSIS — G8114 Spastic hemiplegia affecting left nondominant side: Secondary | ICD-10-CM | POA: Diagnosis not present

## 2019-08-20 DIAGNOSIS — I69312 Visuospatial deficit and spatial neglect following cerebral infarction: Secondary | ICD-10-CM | POA: Diagnosis not present

## 2019-08-20 DIAGNOSIS — I63411 Cerebral infarction due to embolism of right middle cerebral artery: Secondary | ICD-10-CM

## 2019-08-20 DIAGNOSIS — I6521 Occlusion and stenosis of right carotid artery: Secondary | ICD-10-CM | POA: Diagnosis not present

## 2019-08-20 DIAGNOSIS — I69398 Other sequelae of cerebral infarction: Secondary | ICD-10-CM | POA: Diagnosis not present

## 2019-08-20 DIAGNOSIS — G8194 Hemiplegia, unspecified affecting left nondominant side: Secondary | ICD-10-CM

## 2019-08-20 DIAGNOSIS — H53462 Homonymous bilateral field defects, left side: Secondary | ICD-10-CM

## 2019-08-20 DIAGNOSIS — H538 Other visual disturbances: Secondary | ICD-10-CM | POA: Diagnosis not present

## 2019-08-20 MED ORDER — POLYETHYLENE GLYCOL 3350 17 G PO PACK: 17.0000 g | PACK | Freq: Every day | ORAL | Status: DC

## 2019-08-20 NOTE — Progress Notes (Signed)
[facility] [address] [place]. [place] [address] [phone]       OFFICE CONSULT NOTE  Mr. Henry [name] Date of Birth:  17-Mar-1940 Medical Record Number:  [id number]   Referring MD: Rosalin Hawking  Reason for Referral: Stroke HPI: Henry [name] is a 79 year old Caucasian male seen today for initial office consultation visit.  History is obtained from the patient, review of electronic medical records and have personally reviewed imaging films in PACS.  Patient has past medical history of hypertension, polycythemia, low back pain and recent Covid infection who presented to [facility] on 04/17/2019 as a code stroke for sudden onset of left-sided weakness and neglect.  On arrival he had NIH stroke scale of 16.  CT scan of the head showed early acute ischemic changes involving the right MCA territory and is aspect score was 4.  CT perfusion however showed only 10 mm core infarct with 147 cc of penumbra with a mismatch of 137 mL.  Due to this mismatch patient was taken for emergent MRI scan which showed a large area of restricted diffusion however with corresponding T2 flair mismatch hence after discussion with patient and daughter as well as interventional neuroradiologist it was decided to take the patient for mechanical thrombectomy which was performed successfully with TICI 2 c reveasularization by Dr. Estanislado Pandy.  He was kept in the intensive care unit for blood pressure was tightly controlled.  Follow-up scan showed large right MCA infarct with some petechial hemorrhage with local mass-effect.  2D echo showed ejection fraction 55 to 60% without cardiac source of embolism.  Lower extremity venous Doppler showed DVT in the left gastrocnemius vein.  Transcranial Doppler study was negative for PFO.  LDL cholesterol was 53 mg percent hemoglobin A1c was 6.8.  Patient was transferred to skilled nursing facility for rehab and started on Eliquis for his DVT.  Patient states she is  done well he has finished rehabilitation and skilled nursing facility and is presently living at home.  He is finished home physical and occupational therapy and plans to start outpatient therapy this week.  He is able to ambulate with a cane he states his balance is good is an only one minor fall.  He is on Eliquis which is tolerating well without bleeding or bruising.  His blood pressures well controlled today it is 126/74.  He is tolerating Lipitor well without muscle aches and pains but he states he is constipated and feels this may be related to Lipitor.  ROS:   14 system review of systems is positive for weakness, gait difficulty, vision loss, bruising and all other systems negative  PMH:  Past Medical History:  Diagnosis Date  . Colon polyps   . COVID-19   . Elevated MCV 2011  . GERD (gastroesophageal reflux disease) 2012  . 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 Osteoarthritis   . Polycythemia 2011    Social History:  Social History   Socioeconomic History  . Marital status: Married    Spouse name: Not on file  . Number of children: 3  . Years of education: Not on file  . Highest education level: Not on file  Occupational History  . Occupation: Retired    Fish farm manager: RETIRED  Tobacco Use  . Smoking status: Former Smoker    Packs/day: 1.00    Types: Cigarettes    Quit date: 05/24/1976    Years since quitting:  43.2  . Smokeless tobacco: Never Used  Vaping Use  . Vaping Use: Never used  Substance and Sexual Activity  . Alcohol use: No  . Drug use: No  . Sexual activity: Not Currently  Other Topics Concern  . Not on file  Social History Narrative  . Not on file   Social Determinants of Health   Financial Resource Strain:   . Difficulty of Paying Living Expenses:   Food Insecurity:   . Worried About Charity fundraiser in the Last Year:   . Arboriculturist in the Last Year:   Transportation Needs:   . Lexicographer (Medical):   [name] Lack of Transportation (Non-Medical):   Physical Activity:   . Days of Exercise per Week:   . Minutes of Exercise per Session:   Stress:   . Feeling of Stress :   Social Connections:   . Frequency of Communication with Friends and Family:   . Frequency of Social Gatherings with Friends and Family:   . Attends Religious Services:   . Active Member of Clubs or Organizations:   . Attends Archivist Meetings:   [name] Marital Status:   Intimate Partner Violence:   . Fear of Current or Ex-Partner:   . Emotionally Abused:   [name] Physically Abused:   . Sexually Abused:     Medications:   Current Outpatient Medications on File Prior to Visit  Medication Sig Dispense Refill  . apixaban (ELIQUIS) 5 MG TABS tablet Take 1 tablet (5 mg total) by mouth 2 (two) times daily. 60 tablet 11  . atorvastatin (LIPITOR) 40 MG tablet Take 0.5 tablets (20 mg total) by mouth daily at 6 PM. 30 tablet 11  . B Complex-Folic Acid (B COMPLEX PLUS) TABS Take 1 tablet by mouth daily. 100 tablet 3  . Cholecalciferol (VITAMIN D3) 50 MCG (2000 UT) capsule Take 1 capsule (2,000 Units total) by mouth daily. 100 capsule 3  . diltiazem (CARDIZEM) 60 MG tablet Take 1 tablet (60 mg total) by mouth 2 (two) times daily. 60 tablet 11  . Lactulose 20 GM/30ML SOLN Take 30-60 mLs (20-40 g total) by mouth 2 (two) times daily as needed (severe constipation). 450 mL 3  . Misc Natural Products (PROSTATE HEALTH) CAPS Take 1 capsule by mouth daily.    . Probiotic Product (ALIGN PO) Take 1 capsule by mouth daily.    [name] senna-docusate (SENOKOT-S) 8.6-50 MG tablet Take 2 tablets by mouth 2 (two) times daily. 100 tablet 6   No current facility-administered medications on file prior to visit.    Allergies:   Allergies  Allergen Reactions  . No Known Allergies     Physical Exam General: well developed, well nourished elderly Caucasian male, seated, in no evident distress Head: head normocephalic and  atraumatic.   Neck: supple with no carotid or supraclavicular bruits Cardiovascular: regular rate and rhythm, soft ejection murmur. Musculoskeletal: no deformity Skin:  no rash/petichiae Vascular:  Normal pulses all extremities  Neurologic Exam Mental Status: Awake and fully alert. Oriented to place and time. Recent and remote memory intact. Attention span, concentration and fund of knowledge appropriate. Mood and affect appropriate.  Cranial Nerves: Fundoscopic exam reveals sharp disc margins. Pupils equal, briskly reactive to light. Extraocular movements full without nystagmus. 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 Hearing diminished bilaterally. Facial sensation intact.  Mild left lower facial asymmetry., tongue, palate moves normally and symmetrically.  Motor: Spastic left hemiparesis with grade  2/5 strength at left shoulder and elbow with significant weakness of left grip and fingers in intrinsic hand muscles but is able to bend fingers and make a weak grip..  Mild left lower extremities weakness in the hip flexors and ankle dorsiflexors.  Left upper and lower extremity drift. Sensory.: intact to touch , pinprick , position and vibratory sensation.  Coordination: Rapid alternating movements normal in all extremities. Finger-to-nose and heel-to-shin performed accurately bilaterally. Gait and Station: Arises from chair without difficulty. Stance is normal. Gait demonstrates mild stiffness and circumduction of the left leg with mild foot drop.   Reflexes: 1+ and asymmetric and brisker on the left. Toes downgoing.   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     PLAN: I  had a long d/w patient about his recent stroke, risk for recurrent stroke/TIAs, personally independently reviewed imaging studies and stroke evaluation results and answered questions.Continue Eliquis (apixaban) daily  for secondary stroke prevention given h/o DVT and maintain strict control of hypertension with blood pressure goal below 130/90, diabetes with hemoglobin A1c goal below 6.5% and lipids with LDL cholesterol goal below 70 mg/dL. I also advised the patient to eat a healthy diet with plenty of whole grains, cereals, fruits and vegetables, exercise regularly and maintain ideal body weight.  Check follow-up carotid ultrasound study to follow his known proximal right ICA stenosis and if greater than 60% stenosis may need to consider referral for revascularization.  I also recommend he try MiraLAX for his constipation.  Greater than 50% time during this 45-minute consultation was it was spent on counseling and coordination of care about his stroke and DVT and planning treatment and answering questions.  Followup in the future with me in 2 months or call earlier if necessary. Antony Contras, MD  [facility] [address] [place], [place] [address]  Phone [phone] Fax [phone] Note: This document was prepared with digital dictation and possible smart phrase technology. Any transcriptional errors that result from this process are unintentional.

## 2019-08-20 NOTE — Patient Instructions (Addendum)
I had a long d/w patient about his recent stroke, risk for recurrent stroke/TIAs, personally independently reviewed imaging studies and stroke evaluation results and answered questions.Continue Eliquis (apixaban) daily  for secondary stroke prevention given h/o DVT and maintain strict control of hypertension with blood pressure goal below 130/90, diabetes with hemoglobin A1c goal below 6.5% and lipids with LDL cholesterol goal below 70 mg/dL. I also advised the patient to eat a healthy diet with plenty of whole grains, cereals, fruits and vegetables, exercise regularly and maintain ideal body weight.  Check follow-up carotid ultrasound study to follow his known proximal right ICA stenosis and if greater than 60% stenosis may need to consider referral for revascularization.  I also recommend he try MiraLAX for his constipation.  Followup in the future with me in 2 months or call earlier if necessary.  Stroke Prevention Some medical conditions and behaviors are associated with a higher chance of having a stroke. You can help prevent a stroke by making nutrition, lifestyle, and other changes, including managing any medical conditions you may have. What nutrition changes can be made?   Eat healthy foods. You can do this by: ? Choosing foods high in fiber, such as fresh fruits and vegetables and whole grains. ? Eating at least 5 or more servings of fruits and vegetables a day. Try to fill half of your plate at each meal with fruits and vegetables. ? Choosing lean protein foods, such as lean cuts of meat, poultry without skin, fish, tofu, beans, and nuts. ? Eating low-fat dairy products. ? Avoiding foods that are high in salt (sodium). This can help lower blood pressure. ? Avoiding foods that have saturated fat, trans fat, and cholesterol. This can help prevent high cholesterol. ? Avoiding processed and premade foods.  Follow your health care provider's specific guidelines for losing weight, controlling  high blood pressure (hypertension), lowering high cholesterol, and managing diabetes. These may include: ? Reducing your daily calorie intake. ? Limiting your daily sodium intake to 1,500 milligrams (mg). ? Using only healthy fats for cooking, such as olive oil, canola oil, or sunflower oil. ? Counting your daily carbohydrate intake. What lifestyle changes can be made?  Maintain a healthy weight. Talk to your health care provider about your ideal weight.  Get at least 30 minutes of moderate physical activity at least 5 days a week. Moderate activity includes brisk walking, biking, and swimming.  Do not use any products that contain nicotine or tobacco, such as cigarettes and e-cigarettes. If you need help quitting, ask your health care provider. It may also be helpful to avoid exposure to secondhand smoke.  Limit alcohol intake to no more than 1 drink a day for nonpregnant women and 2 drinks a day for men. One drink equals 12 oz of beer, 5 oz of wine, or 1 oz of hard liquor.  Stop any illegal drug use.  Avoid taking birth control pills. Talk to your health care provider about the risks of taking birth control pills if: ? You are over 1 years old. ? You smoke. ? You get migraines. ? You have ever had a blood clot. What other changes can be made?  Manage your cholesterol levels. ? Eating a healthy diet is important for preventing high cholesterol. If cholesterol cannot be managed through diet alone, you may also need to take medicines. ? Take any prescribed medicines to control your cholesterol as told by your health care provider.  Manage your diabetes. ? Eating a healthy diet and  exercising regularly are important parts of managing your blood sugar. If your blood sugar cannot be managed through diet and exercise, you may need to take medicines. ? Take any prescribed medicines to control your diabetes as told by your health care provider.  Control your hypertension. ? To reduce your  risk of stroke, try to keep your blood pressure below 130/80. ? Eating a healthy diet and exercising regularly are an important part of controlling your blood pressure. If your blood pressure cannot be managed through diet and exercise, you may need to take medicines. ? Take any prescribed medicines to control hypertension as told by your health care provider. ? Ask your health care provider if you should monitor your blood pressure at home. ? Have your blood pressure checked every year, even if your blood pressure is normal. Blood pressure increases with age and some medical conditions.  Get evaluated for sleep disorders (sleep apnea). Talk to your health care provider about getting a sleep evaluation if you snore a lot or have excessive sleepiness.  Take over-the-counter and prescription medicines only as told by your health care provider. Aspirin or blood thinners (antiplatelets or anticoagulants) may be recommended to reduce your risk of forming blood clots that can lead to stroke.  Make sure that any other medical conditions you have, such as atrial fibrillation or atherosclerosis, are managed. What are the warning signs of a stroke? The warning signs of a stroke can be easily remembered as BEFAST.  B is for balance. Signs include: ? Dizziness. ? Loss of balance or coordination. ? Sudden trouble walking.  E is for eyes. Signs include: ? A sudden change in vision. ? Trouble seeing.  F is for face. Signs include: ? Sudden weakness or numbness of the face. ? The face or eyelid drooping to one side.  A is for arms. Signs include: ? Sudden weakness or numbness of the arm, usually on one side of the body.  S is for speech. Signs include: ? Trouble speaking (aphasia). ? Trouble understanding.  T is for time. ? These symptoms may represent a serious problem that is an emergency. Do not wait to see if the symptoms will go away. Get medical help right away. Call your local emergency  services (911 in the [place] Do not drive yourself to the hospital.  Other signs of stroke may include: ? A sudden, severe headache with no known cause. ? Nausea or vomiting. ? Seizure. Where to find more information For more information, visit:  American Stroke Association: www.strokeassociation.org  National Stroke Association: www.stroke.org Summary  You can prevent a stroke by eating healthy, exercising, not smoking, limiting alcohol intake, and managing any medical conditions you may have.  Do not use any products that contain nicotine or tobacco, such as cigarettes and e-cigarettes. If you need help quitting, ask your health care provider. It may also be helpful to avoid exposure to secondhand smoke.  Remember BEFAST for warning signs of stroke. Get help right away if you or a loved one has any of these signs.[email or web address] This information is not intended to replace advice given to you by your health care provider. Make sure you discuss any questions you have with your health care provider. Document Revised: 02/03/2017 Document Reviewed: 03/29/2016 Elsevier Patient Education  2020 [employer].

## 2019-08-26 ENCOUNTER — Other Ambulatory Visit: Payer: Self-pay

## 2019-08-26 ENCOUNTER — Ambulatory Visit (INDEPENDENT_AMBULATORY_CARE_PROVIDER_SITE_OTHER): Payer: Medicare HMO | Admitting: Internal Medicine

## 2019-08-26 ENCOUNTER — Encounter: Payer: Self-pay | Admitting: Internal Medicine

## 2019-08-26 DIAGNOSIS — I6523 Occlusion and stenosis of bilateral carotid arteries: Secondary | ICD-10-CM | POA: Insufficient documentation

## 2019-08-26 DIAGNOSIS — I6601 Occlusion and stenosis of right middle cerebral artery: Secondary | ICD-10-CM

## 2019-08-26 DIAGNOSIS — I82409 Acute embolism and thrombosis of unspecified deep veins of unspecified lower extremity: Secondary | ICD-10-CM | POA: Insufficient documentation

## 2019-08-26 DIAGNOSIS — L304 Erythema intertrigo: Secondary | ICD-10-CM | POA: Diagnosis not present

## 2019-08-26 DIAGNOSIS — E785 Hyperlipidemia, unspecified: Secondary | ICD-10-CM | POA: Diagnosis not present

## 2019-08-26 DIAGNOSIS — I82402 Acute embolism and thrombosis of unspecified deep veins of left lower extremity: Secondary | ICD-10-CM

## 2019-08-26 LAB — BASIC METABOLIC PANEL
BUN: 14 mg/dL (ref 6–23)
CO2: 31 mEq/L (ref 19–32)
Calcium: 9.8 mg/dL (ref 8.4–10.5)
Chloride: 103 mEq/L (ref 96–112)
Creatinine, Ser: 0.93 mg/dL (ref 0.40–1.50)
GFR: 78.7 mL/min (ref >60.00)
Glucose, Bld: 107 mg/dL — ABNORMAL HIGH (ref 70–99)
Potassium: 4.2 mEq/L (ref 3.5–5.1)
Sodium: 140 mEq/L (ref 135–145)

## 2019-08-26 LAB — LIPID PANEL
Cholesterol: 147 mg/dL (ref 0–200)
HDL: 41.8 mg/dL (ref >39.00)
LDL Cholesterol: 70 mg/dL (ref 0–99)
NonHDL: 105.58
Total CHOL/HDL Ratio: 4
Triglycerides: 180 mg/dL — ABNORMAL HIGH (ref 0.0–149.0)
VLDL: 36 mg/dL (ref 0.0–40.0)

## 2019-08-26 LAB — HEPATIC FUNCTION PANEL
ALT: 23 U/L (ref 0–53)
AST: 22 U/L (ref 0–37)
Albumin: 4.3 g/dL (ref 3.5–5.2)
Alkaline Phosphatase: 74 U/L (ref 39–117)
Bilirubin, Direct: 0.1 mg/dL (ref 0.0–0.3)
Total Bilirubin: 0.5 mg/dL (ref 0.2–1.2)
Total Protein: 7 g/dL (ref 6.0–8.3)

## 2019-08-26 MED ORDER — CLOTRIMAZOLE-BETAMETHASONE 1-0.05 % EX CREA: 1.0000 "application " | TOPICAL_CREAM | Freq: Two times a day (BID) | CUTANEOUS | 1 refills | Status: DC

## 2019-08-26 NOTE — Assessment & Plan Note (Signed)
Atorvastatin, Eliquis.

## 2019-08-26 NOTE — Addendum Note (Signed)
Addended by: Pollyann Glen on: 08/26/2019 09:05 AM   Modules accepted: Orders

## 2019-08-26 NOTE — Assessment & Plan Note (Signed)
Lotrisone 

## 2019-08-26 NOTE — Assessment & Plan Note (Signed)
Atorvastatin

## 2019-08-26 NOTE — Assessment & Plan Note (Signed)
proximal right ICA stenosis and if greater than 60% stenosis may need to consider referral for revascularization. f/u w/Dr Leonie Man. Atorvastatin, Eliquis.

## 2019-08-26 NOTE — Addendum Note (Signed)
Addended by: Trenda Moots on: 3/59/4090 09:06 AM   Modules accepted: Orders

## 2019-08-26 NOTE — Progress Notes (Signed)
Subjective:  Patient ID: Henry [name], male    DOB: 1940/08/24  Age: 79 y.o. MRN: [id number]  CC: No chief complaint on file.   HPI Henry [name] presents for HTN, CVA, dyslipidemia f/u. F/u DVT in the R leg - Lower extremity venous Doppler showed DVT in the left gastrocnemius vein. 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  C/o groin rash  Outpatient Medications Prior to Visit  Medication Sig Dispense Refill  . apixaban (ELIQUIS) 5 MG TABS tablet Take 1 tablet (5 mg total) by mouth 2 (two) times daily. 60 tablet 11  . atorvastatin (LIPITOR) 40 MG tablet Take 0.5 tablets (20 mg total) by mouth daily at 6 PM. 30 tablet 11  . B Complex-Folic Acid (B COMPLEX PLUS) TABS Take 1 tablet by mouth daily. 100 tablet 3  . Cholecalciferol (VITAMIN D3) 50 MCG (2000 UT) capsule Take 1 capsule (2,000 Units total) by mouth daily. 100 capsule 3  . diltiazem (CARDIZEM) 60 MG tablet Take 1 tablet (60 mg total) by mouth 2 (two) times daily. 60 tablet 11  . Lactulose 20 GM/30ML SOLN Take 30-60 mLs (20-40 g total) by mouth 2 (two) times daily as needed (severe constipation). 450 mL 3  . Misc Natural Products (PROSTATE HEALTH) CAPS Take 1 capsule by mouth daily.    . Probiotic Product (ALIGN PO) Take 1 capsule by mouth daily.    [name] senna-docusate (SENOKOT-S) 8.6-50 MG tablet Take 2 tablets by mouth 2 (two) times daily. 100 tablet 6   Facility-Administered Medications Prior to Visit  Medication Dose Route Frequency Provider Last Rate Last Admin  . polyethylene glycol (MIRALAX / GLYCOLAX) packet 17 g  17 g Oral Daily Garvin Fila, MD        ROS: Review of Systems  Constitutional: Negative for appetite change, fatigue and unexpected weight change.  HENT: Negative for congestion, nosebleeds, sneezing, sore throat and trouble swallowing.   Eyes: Negative for itching and visual disturbance.  Respiratory: Negative for cough.    Cardiovascular: Negative for chest pain, palpitations and leg swelling.  Gastrointestinal: Negative for abdominal distention, blood in stool, diarrhea and nausea.  Genitourinary: Negative for frequency and hematuria.  Musculoskeletal: Negative for back pain, gait problem, joint swelling and neck pain.  Skin: Positive for rash.  Neurological: Positive for weakness. Negative for dizziness, tremors and speech difficulty.  Psychiatric/Behavioral: Negative for agitation, dysphoric mood and sleep disturbance. The patient is not nervous/anxious.     Objective:  Ht 5\' 8"  (1.727 m)   Wt 148 lb (67.1 kg)   BMI 22.50 kg/m   BP Readings from Last 3 Encounters:  08/20/19 120/74  08/08/19 122/88  06/27/19 106/74    Wt Readings from Last 3 Encounters:  08/26/19 148 lb (67.1 kg)  08/20/19 148 lb (67.1 kg)  08/08/19 149 lb (67.6 kg)    Physical Exam Constitutional:      General: He is not in acute distress.    Appearance: He is well-developed.     Comments: NAD  Eyes:     Conjunctiva/sclera: Conjunctivae normal.     Pupils: Pupils are equal, round, and reactive to light.  Neck:     Thyroid: No thyromegaly.     Vascular: No JVD.  Cardiovascular:     Rate and Rhythm: Normal rate and regular rhythm.     Heart sounds: Normal heart sounds. No murmur heard.  No friction rub. No gallop.  Pulmonary:     Effort: Pulmonary effort is normal. No respiratory distress.     Breath sounds: Normal breath sounds. No wheezing or rales.  Chest:     Chest wall: No tenderness.  Abdominal:     General: Bowel sounds are normal. There is no distension.     Palpations: Abdomen is soft. There is no mass.     Tenderness: There is no abdominal tenderness. There is no guarding or rebound.  Musculoskeletal:        General: No tenderness. Normal range of motion.     Cervical back: Normal range of motion.  Lymphadenopathy:     Cervical: No cervical adenopathy.  Skin:    General: Skin is warm and dry.      Findings: Erythema and rash present.  Neurological:     Mental Status: He is alert and oriented to person, place, and time.     Cranial Nerves: No cranial nerve deficit.     Motor: No abnormal muscle tone.     Coordination: Coordination normal.     Gait: Gait normal.     Deep Tendon Reflexes: Reflexes are normal and symmetric.  Psychiatric:        Behavior: Behavior normal.        Thought Content: Thought content normal.        Judgment: Judgment normal.    Intertrigo - groin L hemiparesis  Lab Results  Component Value Date   WBC 6.4 05/29/2019   HGB 13.0 05/29/2019   HCT 38.0 (L) 05/29/2019   PLT 258.0 05/29/2019   GLUCOSE 114 (H) 05/29/2019   CHOL 93 04/18/2019   TRIG 108 04/25/2019   HDL 19 (L) 04/18/2019   LDLDIRECT 135.0 02/24/2017   LDLCALC 53 04/18/2019   ALT 33 05/29/2019   AST 31 05/29/2019   NA 139 05/29/2019   K 4.4 05/29/2019   CL 102 05/29/2019   CREATININE 0.74 05/29/2019   BUN 14 05/29/2019   CO2 31 05/29/2019   TSH 1.26 05/29/2019   PSA 1.75 02/24/2017   INR 1.2 04/17/2019   HGBA1C 6.6 (H) 05/29/2019    CT ANGIO HEAD W OR WO CONTRAST  Result Date: 04/17/2019 CLINICAL DATA:  Stroke, follow-up. EXAM: CT ANGIOGRAPHY HEAD AND NECK CT PERFUSION BRAIN TECHNIQUE: Multidetector CT imaging of the head and neck was performed using the standard protocol during bolus administration of intravenous contrast. Multiplanar CT image reconstructions and MIPs were obtained to evaluate the vascular anatomy. Carotid stenosis measurements (when applicable) are obtained utilizing NASCET criteria, using the distal internal carotid diameter as the denominator. Multiphase CT imaging of the brain was performed following IV bolus contrast injection. Subsequent parametric perfusion maps were calculated using RAPID software. CONTRAST:  112mL OMNIPAQUE IOHEXOL 350 MG/ML SOLN COMPARISON:  Noncontrast head CT performed earlier the same day. FINDINGS: CTA NECK FINDINGS Aortic arch: The  origins of the innominate and left common carotid arteries are incompletely imaged. The visualized aortic arch is unremarkable. No significant innominate or proximal subclavian stenosis visualized. Right carotid system: CCA patent to the bifurcation without stenosis. Prominent predominantly calcified plaque within the distal common carotid artery and proximal ICA. There is likely greater than 70% stenosis at the origin of the ICA, although exact quantification of stenosis is difficult due to irregularity of calcified plaque. Distal to this the ICA is patent within the neck without stenosis. Left carotid system: CCA patent to the bifurcation without stenosis. Calcified plaque within the distal common carotid artery, carotid bifurcation and proximal  ICA. Resultant 30-40% narrowing of the proximal ICA as compared to the more distal vessel. Distal to this the ICA is patent within the neck without stenosis. Vertebral arteries: Right vertebral artery dominant. 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 Calcified plaque at the origin of the medial V1 left vertebral artery branch with at least moderate ostial stenosis. More distally the bilateral vertebral arteries are patent within the neck without stenosis. Skeleton: No acute bony abnormality. Cervical spondylosis with C5-C6 and C6-C7 posterior disc osteophytes. Other neck: No neck mass or cervical adenopathy. Upper chest: Paraseptal emphysema within the imaged lung apices. Also within the imaged lung apices but there is interstitial prominence suspicious for interstitial lung disease. There are more nodular opacities within the right upper lobe measuring up to 1.4 cm (series 7, image 56) (series 7, image 99). Review of the MIP images confirms the above findings CTA HEAD FINDINGS Anterior circulation: The intracranial internal carotid arteries are patent with scattered calcified plaque. No more than mild stenosis within these vessels. The M1 right MCA is occluded shortly  beyond its origin. There is minimal reconstitution of flow seen within M2 and more distal right MCA branch vessels. The left middle cerebral artery is patent without high-grade proximal stenosis. The bilateral anterior cerebral arteries are patent without significant proximal stenosis. Posterior circulation: The intracranial vertebral arteries are patent without significant stenosis, as is the basilar artery. The bilateral posterior cerebral arteries are patent without significant proximal stenosis. A sizable right posterior communicating artery is present. The a left posterior communicating artery is poorly delineated and may be hypoplastic or absent. Venous sinuses: Within limitations of contrast timing, no convincing thrombus. Anatomic variants: As described Review of the MIP images confirms the above findings CT Brain Perfusion Findings: ASPECTS: 4 CBF (<30%) Volume: 88mL (in the right MCA vascular territory) Perfusion (Tmax>6.0s) volume: 147 mL (in the right MCA vascular territory) Mismatch Volume: 126mL Infarction Location:Right MCA vascular territory Findings of a proximal M1 right MCA occlusion and the perfusion results were discussed by telephone at the time of interpretation on 04/17/2019 at 10:38 am to provider Dr. Lorraine Lax, who verbally acknowledged these results. IMPRESSION: CTA neck: 1. The common and internal carotid arteries are patent within the neck bilaterally. 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 There is likely greater than 70% stenosis at the origin of the right ICA. 30-40% stenosis of the proximal left ICA. 2. The vertebral arteries are patent within the neck bilaterally. The right vertebral artery is dominant with moderate atherosclerotic narrowing at its origin. Duplicated V1 left vertebral artery. There is at least moderate ostial stenosis at the origin of the medial left V1 vertebral artery branch. 3. The partially imaged lung apices demonstrate findings  suspicious for interstitial lung disease. Additionally, there are nodular opacities within the right lung apex measuring up to 1.4 cm. Dedicated chest CT is recommended for further evaluation when clinically feasible. CTA head: 1. 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 There is minimal reconstitution of flow within M2 and more distal right MCA branch vessels. 2. No other intracranial large vessel occlusion or proximal high-grade arterial stenosis identified. CT perfusion head: The perfusion software identifies a 10 mL core infarct within the right MCA vascular territory. However, please note more extensive infarction changes were appreciated on concurrently performed noncontrast head CT. The perfusion software detects a 147 mL region of hypoperfused parenchyma within the right MCA vascular territory. Reported mismatch 137 mL. Electronically Signed   By: Marylyn Ishihara  Golden DO   On: 04/17/2019 11:04   CT HEAD WO CONTRAST  Result Date: 04/17/2019 CLINICAL DATA:  Follow-up intervention for right MCA occlusion. EXAM: CT HEAD WITHOUT CONTRAST TECHNIQUE: Contiguous axial images were obtained from the base of the skull through the vertex without intravenous contrast. COMPARISON:  Interventional images same day.  Head CT same day. FINDINGS: Brain: In the region the small core infarction in the lateral temporal lobe and the penumbra of the majority of the right middle cerebral artery cortical territory, there is brain swelling with effacement of the sulci and areas of patchy low-density as well as some areas of increased attenuation. Low-density present within the right basal ganglia. The appearance of the increased attenuation is more consistent with post procedural contrast staining than intraparenchymal hemorrhage. Hemorrhage is not excluded but not favored. Certainly, there is no frank hematoma. No evidence of midline shift. No hydrocephalus. No extra-axial collection. Vascular: No acute vascular finding  primarily. Skull: Negative Sinuses/Orbits: Clear except for mild mucosal thickening. Other: None IMPRESSION: Ischemic changes in the right MCA territory affecting the cortical brain, subcortical brain and right basal ganglia. Areas of low-density as well as areas of indistinct higher density favored to represent contrast staining rather than frank hemorrhage. Low level petechial bleeding is not excluded. There is no discrete hematoma. No mass effect or shift, despite mild-to-moderate swelling in the region of affected brain. Electronically Signed   By: Nelson Chimes M.D.   On: 04/17/2019 20:35   CT ANGIO NECK W OR WO CONTRAST  Result Date: 04/17/2019 CLINICAL DATA:  Stroke, follow-up. EXAM: CT ANGIOGRAPHY HEAD AND NECK CT PERFUSION BRAIN TECHNIQUE: Multidetector CT imaging of the head and neck was performed using the standard protocol during bolus administration of intravenous contrast. Multiplanar CT image reconstructions and MIPs were obtained to evaluate the vascular anatomy. Carotid stenosis measurements (when applicable) are obtained utilizing NASCET criteria, using the distal internal carotid diameter as the denominator. Multiphase CT imaging of the brain was performed following IV bolus contrast injection. Subsequent parametric perfusion maps were calculated using RAPID software. CONTRAST:  133mL OMNIPAQUE IOHEXOL 350 MG/ML SOLN COMPARISON:  Noncontrast head CT performed earlier the same day. FINDINGS: CTA NECK FINDINGS Aortic arch: The origins of the innominate and left common carotid arteries are incompletely imaged. The visualized aortic arch is unremarkable. No significant innominate or proximal subclavian stenosis visualized. Right carotid system: CCA patent to the bifurcation without stenosis. Prominent predominantly calcified plaque within the distal common carotid artery and proximal ICA. There is likely greater than 70% stenosis at the origin of the ICA, although exact quantification of stenosis  is difficult due to irregularity of calcified plaque. Distal to this the ICA is patent within the neck without stenosis. Left carotid system: CCA patent to the bifurcation without stenosis. Calcified plaque within the distal common carotid artery, carotid bifurcation and proximal ICA. Resultant 30-40% narrowing of the proximal ICA as compared to the more distal vessel. Distal to this the ICA is patent within the neck without stenosis. Vertebral arteries: Right vertebral artery dominant. 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 Calcified plaque at the origin of the medial V1 left vertebral artery branch with at least moderate ostial stenosis. More distally the bilateral vertebral arteries are patent within the neck without stenosis. Skeleton: No acute bony abnormality. Cervical spondylosis with C5-C6 and C6-C7 posterior disc osteophytes. Other neck: No neck mass or cervical adenopathy. Upper chest: Paraseptal emphysema within the imaged lung apices. Also within the imaged lung apices but  there is interstitial prominence suspicious for interstitial lung disease. There are more nodular opacities within the right upper lobe measuring up to 1.4 cm (series 7, image 56) (series 7, image 99). Review of the MIP images confirms the above findings CTA HEAD FINDINGS Anterior circulation: The intracranial internal carotid arteries are patent with scattered calcified plaque. No more than mild stenosis within these vessels. 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 There is minimal reconstitution of flow seen within M2 and more distal right MCA branch vessels. The left middle cerebral artery is patent without high-grade proximal stenosis. The bilateral anterior cerebral arteries are patent without significant proximal stenosis. Posterior circulation: The intracranial vertebral arteries are patent without significant stenosis, as is the basilar artery. The bilateral posterior cerebral arteries are patent without  significant proximal stenosis. A sizable right posterior communicating artery is present. The a left posterior communicating artery is poorly delineated and may be hypoplastic or absent. Venous sinuses: Within limitations of contrast timing, no convincing thrombus. Anatomic variants: As described Review of the MIP images confirms the above findings CT Brain Perfusion Findings: ASPECTS: 4 CBF (<30%) Volume: 41mL (in the right MCA vascular territory) Perfusion (Tmax>6.0s) volume: 147 mL (in the right MCA vascular territory) Mismatch Volume: 169mL Infarction Location:Right MCA vascular territory Findings of a proximal M1 right MCA occlusion and the perfusion results were discussed by telephone at the time of interpretation on 04/17/2019 at 10:38 am to provider Dr. Lorraine Lax, who verbally acknowledged these results. IMPRESSION: CTA neck: 1. The common and internal carotid arteries are patent within the neck bilaterally. 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 There is likely greater than 70% stenosis at the origin of the right ICA. 30-40% stenosis of the proximal left ICA. 2. The vertebral arteries are patent within the neck bilaterally. The right vertebral artery is dominant with moderate atherosclerotic narrowing at its origin. Duplicated V1 left vertebral artery. There is at least moderate ostial stenosis at the origin of the medial left V1 vertebral artery branch. 3. The partially imaged lung apices demonstrate findings suspicious for interstitial lung disease. Additionally, there are nodular opacities within the right lung apex measuring up to 1.4 cm. Dedicated chest CT is recommended for further evaluation when clinically feasible. CTA head: 1. 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 There is minimal reconstitution of flow within M2 and more distal right MCA branch vessels. 2. No other intracranial large vessel occlusion or proximal high-grade arterial stenosis identified. CT  perfusion head: The perfusion software identifies a 10 mL core infarct within the right MCA vascular territory. However, please note more extensive infarction changes were appreciated on concurrently performed noncontrast head CT. The perfusion software detects a 147 mL region of hypoperfused parenchyma within the right MCA vascular territory. Reported mismatch 137 mL. Electronically Signed   By: Kellie Simmering DO   On: 04/17/2019 11:04   MR BRAIN WO CONTRAST  Result Date: 04/17/2019 CLINICAL DATA:  Focal neuro deficit, greater than 6 hours, stroke suspected. EXAM: MRI HEAD WITHOUT CONTRAST TECHNIQUE: Multiplanar, multiecho pulse sequences of the brain and surrounding structures were obtained without intravenous contrast. COMPARISON:  Noncontrast CT head, CT angiogram head/neck and CT perfusion performed earlier the same day 04/17/2019 FINDINGS: Brain: A limited protocol brain MRI was performed at the ordering provider's request consisting of only axial diffusion-weighted imaging and axial T2 FLAIR imaging. There is extensive multifocal restricted diffusion within the right MCA vascular territory, consistent with acute ischemic changes and  involving portions of the right frontal, parietal, temporal and occipital lobes as well as right insular cortex, posterior limb of right internal capsule, right lentiform nucleus, right caudate body and right corona radiata. Of note, this includes acute ischemic change within the right motor strip. The largest region of diffusion weighted signal abnormality within the right temporal lobe measures 7.8 x 3.0 cm in transaxial dimensions (series 4, image 23). There is T2/FLAIR hyperintensity which corresponds with some, but not all, of the acute ischemic change. For instance, there is little to no T2/FLAIR hyperintensity corresponding with the ischemic changes within the right corona radiata, basal ganglia and right insula at this time. Punctate acute cortical infarct within the  left parietooccipital lobe (series 4, image 33). Additional probable punctate acute infarcts more superiorly within the left parietal lobe (series 4, image 35) and within the lateral left occipital lobe (series 4, image 24). Vascular: Poorly assessed on the acquired sequences. Skull and upper cervical spine: No evidence of focal marrow lesion on the acquired sequences. Sinuses/Orbits: Visualized orbits demonstrate no acute abnormality. Minimal ethmoid sinus mucosal thickening. No significant mastoid effusion. IMPRESSION: Extensive restricted diffusion consistent with acute ischemia involving the right MCA vascular territory. There is T2/FLAIR hyperintensity which corresponds with some, but not all, of the acute ischemic changes as described. Additional punctate acute infarcts within the left parietal and occipital lobes. Electronically Signed   By: Kellie Simmering DO   On: 04/17/2019 12:50   IR CT Head Ltd  Result Date: 04/17/2019 INDICATION: New onset of left-sided weakness with right gaze deviation and dysarthria.  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  EXAM: 1. EMERGENT LARGE VESSEL OCCLUSION THROMBOLYSIS (anterior CIRCULATION)  COMPARISON:  CT angiogram of the head and neck of April 17, 2019.  MEDICATIONS: Ancef 2 g IV antibiotic was administered within 1 hour of the procedure.  ANESTHESIA/SEDATION: General anesthesia.  CONTRAST:  Isovue 300 approximately 65 mL.  FLUOROSCOPY TIME:  Fluoroscopy Time: 41 minutes 18 seconds (1531 mGy).  COMPLICATIONS: None immediate.  TECHNIQUE: Following a full explanation of the procedure along with the potential associated complications, an informed witnessed consent was obtained from the patient's wife and daughter. The risks of intracranial hemorrhage of 10%, worsening neurological deficit, ventilator dependency, death and inability to revascularize were all reviewed in detail with the patient's wife and daughter.  The  patient was then put under general anesthesia by the Department of Anesthesiology at [facility].  The right groin was prepped and draped in the usual sterile fashion. Thereafter using modified Seldinger technique, transfemoral access into the right common femoral artery was obtained without difficulty. Over a 0.035 inch guidewire an 8 [place] Pinnacle sheath was inserted. Through this, and also over a 0.035 inch guidewire a 5 [place] JB 1 catheter was advanced to the aortic arch region and selectively positioned in the innominate artery and the right common carotid artery.  FINDINGS: The innominate artery arteriogram demonstrates the origin of the right subclavian artery and the right common carotid artery to be widely patent.  The right common carotid arteriogram demonstrates the right external carotid artery and its major branches to be widely patent.  The right internal carotid artery at the bulb to the cranial skull base demonstrates wide patency with mild FMD-like changes involving the mid cervical right ICA.  The petrous, cavernous and the supraclinoid segments are widely patent.  A right posterior communicating artery is seen opacifying the right posterior cerebral distribution.  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 The right anterior cerebral artery opacifies into the capillary and venous phases.  PROCEDURE: The diagnostic JB 1 catheter in right common carotid artery was exchanged over an 8 [place] 300 cm Rosen exchange guidewire for an 087 balloon guide catheter which had been prepped with 50% contrast and 50% heparinized saline infusion. This was advanced to the right common carotid artery just proximal to the origin of the right internal carotid artery. The guidewire was removed. Good aspiration was obtained from the hub of the balloon guide catheter.  Gentle contrast injection demonstrated no evidence of spasms or of intraluminal filling defects.  Over a 0.014 inch standard Synchro micro guidewire with a J configuration, the combination of the 021 Trevo ProVue microcatheter inside of a 6 [place] Catalyst 132 cm guide catheter was advanced to the distal end of the balloon guide catheter.  With the micro guidewire leading with a J-tip configuration to avoid dissections or inducing spasm, the combination was navigated without difficulty to the supraclinoid right ICA.  The occluded right middle cerebral artery was entered with a micro guidewire which was advanced to the M3 regions of the inferior division of the right middle cerebral artery followed by the microcatheter. The guidewire was removed. Good aspiration was obtained from the hub of the microcatheter. Gentle contrast injection demonstrated safe position of tip of the microcatheter. This was then connected to continuous heparinized saline infusion.  A 4 mm x 40 mm Solitaire X retrieval device was then advanced to the distal end of the microcatheter. The O ring on the delivery microcatheter was then loosened. With slight forward gentle traction with the right hand on the delivery micro guidewire with left hand the delivery microcatheter was retrieved unsheathing the retrieval device.  The Catalyst 6 [place] guide catheter was advanced to the occluded right middle cerebral artery. With proximal flow arrest obtained by inflating the balloon in the right internal carotid artery, and after constant aspiration using a Penumbra aspiration device at the hub of the 6 [place] Catalyst guide catheter, and with a 60 mL syringe at the hub of the balloon guide catheter, the combination of the retrieval device, the microcatheter, and the 6 [place] Catalyst guide were retrieved and removed as constant aspiration was applied at the hub of the balloon guide catheter.  Proximal flow arrest was reversed. A control arteriogram performed through the balloon guide catheter which had been advanced into the mid right internal  carotid artery demonstrated complete opacification of the right middle cerebral artery and the inferior division. However, the superior division remained occluded.  A second pass was then made again using the above combination. With the micro guidewire leading, access into the M3 region of the superior division was obtained with a microcatheter. The micro guidewire was removed. Good aspiration was established at the hub of the microcatheter. A gentle control arteriogram again demonstrates safe positioning of the tip of the microcatheter which was then connected to continuous heparinized saline infusion. Again with constant flow arrest in the right internal carotid artery, and aspiration at the hub of the Catalyst guide catheter using the Penumbra aspiration device at the origin of the superior division for 2 minutes, and with a 60 mL syringe at the hub of the balloon guide catheter, the combination of the retrieval device, the microcatheter and the 6 [place] Catalyst guide catheter were retrieved and removed. Following reversal of flow arrest, control arteriogram performed through the balloon guide catheter  in the right internal carotid artery now demonstrated revascularization of the superior division and also further improved caliber of the inferior division.  There continued to be a very small branch extending into the anterior temporal region which was occluded in the M2 region. Also noted was a distal M3 M4 junction branch occlusion of a parietal branch of the inferior division.  A TICI 2C revascularization had been achieved.  The right anterior cerebral artery remained widely patent with cross-filling via the anterior communicating artery of the left anterior cerebral A2 segment and distally.  The posterior communicating artery remain widely patent.  Moderate spasm in the distal cervical right ICA, and the superior division of the right middle cerebral artery responded to 2 aliquots of 25 mcg of  nitroglycerin intra-arterially.  A final control arteriogram performed through the balloon guide catheter in the right common carotid artery demonstrated significantly improved caliber of the right internal carotid artery, with maintenance of a TICI 2c revascularization of the right middle cerebral artery distribution.  The balloon guide catheter was removed. The right groin 8 French Pinnacle sheath was then removed with manual compression in the right groin. Distal pulses remained Dopplerable in the dorsalis pedis, and the posterior tibial regions bilaterally unchanged.  A flat panel CT of the brain demonstrated heterogeneous hyper attenuation in the right temporal lobe region anteriorly probably representing contrast stain versus less likely hemorrhagic conversion of infarct tissue.  There was no mass-effect noted on the right lateral ventricle. Patient was then transferred to the ICU unit for post thrombectomy management. Patient was left intubated on account of his COVID status.  IMPRESSION: Endovascular near complete revascularization of the right middle cerebral artery territory with 2 passes with the 4 mm x 40 mm Solitaire X retrieval device and Penumbra aspiration achieving a TICI 2c revascularization.  PLAN: Follow-up as per referring MD.   Electronically Signed   By: Luanne Bras M.D.   On: 04/18/2019 10:42   CT CEREBRAL PERFUSION W CONTRAST  Result Date: 04/17/2019 CLINICAL DATA:  Stroke, follow-up. EXAM: CT ANGIOGRAPHY HEAD AND NECK CT PERFUSION BRAIN TECHNIQUE: Multidetector CT imaging of the head and neck was performed using the standard protocol during bolus administration of intravenous contrast. Multiplanar CT image reconstructions and MIPs were obtained to evaluate the vascular anatomy. Carotid stenosis measurements (when applicable) are obtained utilizing NASCET criteria, using the distal internal carotid diameter as the denominator. Multiphase CT imaging of the brain was  performed following IV bolus contrast injection. Subsequent parametric perfusion maps were calculated using RAPID software. CONTRAST:  146mL OMNIPAQUE IOHEXOL 350 MG/ML SOLN COMPARISON:  Noncontrast head CT performed earlier the same day. FINDINGS: CTA NECK FINDINGS Aortic arch: The origins of the innominate and left common carotid arteries are incompletely imaged. The visualized aortic arch is unremarkable. No significant innominate or proximal subclavian stenosis visualized. Right carotid system: CCA patent to the bifurcation without stenosis. Prominent predominantly calcified plaque within the distal common carotid artery and proximal ICA. There is likely greater than 70% stenosis at the origin of the ICA, although exact quantification of stenosis is difficult due to irregularity of calcified plaque. Distal to this the ICA is patent within the neck without stenosis. Left carotid system: CCA patent to the bifurcation without stenosis. Calcified plaque within the distal common carotid artery, carotid bifurcation and proximal ICA. Resultant 30-40% narrowing of the proximal ICA as compared to the more distal vessel. Distal to this the ICA is patent within the neck without stenosis. Vertebral arteries: Right  vertebral artery dominant. 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 Calcified plaque at the origin of the medial V1 left vertebral artery branch with at least moderate ostial stenosis. More distally the bilateral vertebral arteries are patent within the neck without stenosis. Skeleton: No acute bony abnormality. Cervical spondylosis with C5-C6 and C6-C7 posterior disc osteophytes. Other neck: No neck mass or cervical adenopathy. Upper chest: Paraseptal emphysema within the imaged lung apices. Also within the imaged lung apices but there is interstitial prominence suspicious for interstitial lung disease. There are more nodular opacities within the right upper lobe measuring up to 1.4 cm (series 7, image 56)  (series 7, image 99). Review of the MIP images confirms the above findings CTA HEAD FINDINGS Anterior circulation: The intracranial internal carotid arteries are patent with scattered calcified plaque. No more than mild stenosis within these vessels. 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 There is minimal reconstitution of flow seen within M2 and more distal right MCA branch vessels. The left middle cerebral artery is patent without high-grade proximal stenosis. The bilateral anterior cerebral arteries are patent without significant proximal stenosis. Posterior circulation: The intracranial vertebral arteries are patent without significant stenosis, as is the basilar artery. The bilateral posterior cerebral arteries are patent without significant proximal stenosis. A sizable right posterior communicating artery is present. The a left posterior communicating artery is poorly delineated and may be hypoplastic or absent. Venous sinuses: Within limitations of contrast timing, no convincing thrombus. Anatomic variants: As described Review of the MIP images confirms the above findings CT Brain Perfusion Findings: ASPECTS: 4 CBF (<30%) Volume: 6mL (in the right MCA vascular territory) Perfusion (Tmax>6.0s) volume: 147 mL (in the right MCA vascular territory) Mismatch Volume: 124mL Infarction Location:Right MCA vascular territory Findings of a proximal M1 right MCA occlusion and the perfusion results were discussed by telephone at the time of interpretation on 04/17/2019 at 10:38 am to provider Dr. Lorraine Lax, who verbally acknowledged these results. IMPRESSION: CTA neck: 1. The common and internal carotid arteries are patent within the neck bilaterally. 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 There is likely greater than 70% stenosis at the origin of the right ICA. 30-40% stenosis of the proximal left ICA. 2. The vertebral arteries are patent within the neck bilaterally. The right  vertebral artery is dominant with moderate atherosclerotic narrowing at its origin. Duplicated V1 left vertebral artery. There is at least moderate ostial stenosis at the origin of the medial left V1 vertebral artery branch. 3. The partially imaged lung apices demonstrate findings suspicious for interstitial lung disease. Additionally, there are nodular opacities within the right lung apex measuring up to 1.4 cm. Dedicated chest CT is recommended for further evaluation when clinically feasible. CTA head: 1. 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 There is minimal reconstitution of flow within M2 and more distal right MCA branch vessels. 2. No other intracranial large vessel occlusion or proximal high-grade arterial stenosis identified. CT perfusion head: The perfusion software identifies a 10 mL core infarct within the right MCA vascular territory. However, please note more extensive infarction changes were appreciated on concurrently performed noncontrast head CT. The perfusion software detects a 147 mL region of hypoperfused parenchyma within the right MCA vascular territory. Reported mismatch 137 mL. Electronically Signed   By: Kellie Simmering DO   On: 04/17/2019 11:04   DG Chest Port 1 View  Result Date: 04/18/2019 CLINICAL DATA:  Acute respiratory failure EXAM: PORTABLE CHEST 1 VIEW COMPARISON:  Radiograph 04/17/2019 FINDINGS: *Endotracheal tube in the mid trachea, 5.5 cm from the carina. *Telemetry leads overlie the chest. Slight improvement in the bilateral airspace disease most pronounced in the left base and periphery and right mid lung. No new areas of airspace disease. No pneumothorax or visible effusion the portion of the costophrenic sulci on the left is collimated. Cardiomediastinal contours are stable with a calcified aorta. No acute osseous or soft tissue abnormality. IMPRESSION: Stable satisfactory positioning of the endotracheal tube. Slight interval improvement of bilateral  airspace disease. Electronically Signed   By: Lovena Le M.D.   On: 04/18/2019 06:15   DG Chest Port 1 View  Result Date: 04/17/2019 CLINICAL DATA:  COVID-19. Intubation. EXAM: PORTABLE CHEST 1 VIEW COMPARISON:  Radiograph yesterday. FINDINGS: Endotracheal tube tip 5.8 cm from the carina. Probable esophageal temperature probe with tip at the level of the clavicular heads. No definite enteric tube is visualized. Patchy bilateral airspace disease within both lungs, slight worsening in the right midlung zone from prior. Unchanged heart size and mediastinal contours. No pneumothorax or large pleural effusion. No acute osseous abnormalities are seen. IMPRESSION: 1. Bilateral pneumonia, with slight worsening in the right mid lung since yesterday. 2. Endotracheal tube tip 5.8 cm from the carina. Probable esophageal temperature probe with tip at the clavicular heads. Electronically Signed   By: Keith Rake M.D.   On: 04/17/2019 16:45   IR PERCUTANEOUS ART THROMBECTOMY/INFUSION INTRACRANIAL INC DIAG ANGIO  Result Date: 04/19/2019 INDICATION: New onset of left-sided weakness with right gaze deviation and dysarthria. 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 EXAM: 1. EMERGENT LARGE VESSEL OCCLUSION THROMBOLYSIS (anterior CIRCULATION) COMPARISON:  CT angiogram of the head and neck of April 17, 2019. MEDICATIONS: Ancef 2 g IV antibiotic was administered within 1 hour of the procedure. ANESTHESIA/SEDATION: General anesthesia. CONTRAST:  Isovue 300 approximately 65 mL. FLUOROSCOPY TIME:  Fluoroscopy Time: 41 minutes 18 seconds (1531 mGy). COMPLICATIONS: None immediate. TECHNIQUE: Following a full explanation of the procedure along with the potential associated complications, an informed witnessed consent was obtained from the patient's wife and daughter. The risks of intracranial hemorrhage of 10%, worsening neurological deficit, ventilator dependency, death and inability to  revascularize were all reviewed in detail with the patient's wife and daughter. The patient was then put under general anesthesia by the Department of Anesthesiology at [facility]. The right groin was prepped and draped in the usual sterile fashion. Thereafter using modified Seldinger technique, transfemoral access into the right common femoral artery was obtained without difficulty. Over a 0.035 inch guidewire an 8 [place] Pinnacle sheath was inserted. Through this, and also over a 0.035 inch guidewire a 5 [place] JB 1 catheter was advanced to the aortic arch region and selectively positioned in the innominate artery and the right common carotid artery. FINDINGS: The innominate artery arteriogram demonstrates the origin of the right subclavian artery and the right common carotid artery to be widely patent. The right common carotid arteriogram demonstrates the right external carotid artery and its major branches to be widely patent. The right internal carotid artery at the bulb to the cranial skull base demonstrates wide patency with mild FMD-like changes involving the mid cervical right ICA. The petrous, cavernous and the supraclinoid segments are widely patent. A right posterior communicating artery is seen opacifying the right posterior cerebral distribution. 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 The right anterior cerebral artery opacifies into the capillary and venous phases.  PROCEDURE: The diagnostic JB 1 catheter in right common carotid artery was exchanged over an 8 [place] 300 cm Rosen exchange guidewire for an 087 balloon guide catheter which had been prepped with 50% contrast and 50% heparinized saline infusion. This was advanced to the right common carotid artery just proximal to the origin of the right internal carotid artery. The guidewire was removed. Good aspiration was obtained from the hub of the balloon guide catheter. Gentle contrast injection  demonstrated no evidence of spasms or of intraluminal filling defects. Over a 0.014 inch standard Synchro micro guidewire with a J configuration, the combination of the 021 Trevo ProVue microcatheter inside of a 6 [place] Catalyst 132 cm guide catheter was advanced to the distal end of the balloon guide catheter. With the micro guidewire leading with a J-tip configuration to avoid dissections or inducing spasm, the combination was navigated without difficulty to the supraclinoid right ICA. The occluded right middle cerebral artery was entered with a micro guidewire which was advanced to the M3 regions of the inferior division of the right middle cerebral artery followed by the microcatheter. The guidewire was removed. Good aspiration was obtained from the hub of the microcatheter. Gentle contrast injection demonstrated safe position of tip of the microcatheter. This was then connected to continuous heparinized saline infusion. A 4 mm x 40 mm Solitaire X retrieval device was then advanced to the distal end of the microcatheter. The O ring on the delivery microcatheter was then loosened. With slight forward gentle traction with the right hand on the delivery micro guidewire with left hand the delivery microcatheter was retrieved unsheathing the retrieval device. The Catalyst 6 [place] guide catheter was advanced to the occluded right middle cerebral artery. With proximal flow arrest obtained by inflating the balloon in the right internal carotid artery, and after constant aspiration using a Penumbra aspiration device at the hub of the 6 [place] Catalyst guide catheter, and with a 60 mL syringe at the hub of the balloon guide catheter, the combination of the retrieval device, the microcatheter, and the 6 [place] Catalyst guide were retrieved and removed as constant aspiration was applied at the hub of the balloon guide catheter. Proximal flow arrest was reversed. A control arteriogram performed through the balloon guide  catheter which had been advanced into the mid right internal carotid artery demonstrated complete opacification of the right middle cerebral artery and the inferior division. However, the superior division remained occluded. A second pass was then made again using the above combination. With the micro guidewire leading, access into the M3 region of the superior division was obtained with a microcatheter. The micro guidewire was removed. Good aspiration was established at the hub of the microcatheter. A gentle control arteriogram again demonstrates safe positioning of the tip of the microcatheter which was then connected to continuous heparinized saline infusion. Again with constant flow arrest in the right internal carotid artery, and aspiration at the hub of the Catalyst guide catheter using the Penumbra aspiration device at the origin of the superior division for 2 minutes, and with a 60 mL syringe at the hub of the balloon guide catheter, the combination of the retrieval device, the microcatheter and the 6 [place] Catalyst guide catheter were retrieved and removed. Following reversal of flow arrest, control arteriogram performed through the balloon guide catheter in the right internal carotid artery now demonstrated revascularization of the superior division and also further improved caliber of the inferior division. There continued to be a very small branch extending  into the anterior temporal region which was occluded in the M2 region. Also noted was a distal M3 M4 junction branch occlusion of a parietal branch of the inferior division. A TICI 2C revascularization had been achieved. The right anterior cerebral artery remained widely patent with cross-filling via the anterior communicating artery of the left anterior cerebral A2 segment and distally. The posterior communicating artery remain widely patent. Moderate spasm in the distal cervical right ICA, and the superior division of the right middle cerebral  artery responded to 2 aliquots of 25 mcg of nitroglycerin intra-arterially. A final control arteriogram performed through the balloon guide catheter in the right common carotid artery demonstrated significantly improved caliber of the right internal carotid artery, with maintenance of a TICI 2c revascularization of the right middle cerebral artery distribution. The balloon guide catheter was removed. The right groin 8 [place] Pinnacle sheath was then removed with manual compression in the right groin. Distal pulses remained Dopplerable in the dorsalis pedis, and the posterior tibial regions bilaterally unchanged. A flat panel CT of the brain demonstrated heterogeneous hyper attenuation in the right temporal lobe region anteriorly probably representing contrast stain versus less likely hemorrhagic conversion of infarct tissue. There was no mass-effect noted on the right lateral ventricle. Patient was then transferred to the ICU unit for post thrombectomy management. Patient was left intubated on account of his COVID status. IMPRESSION: Endovascular near complete revascularization of the right middle cerebral artery territory with 2 passes with the 4 mm x 40 mm Solitaire X retrieval device and Penumbra aspiration achieving a TICI 2c revascularization. PLAN: Follow-up as per referring MD. Electronically Signed   By: Luanne Bras M.D.   On: 04/18/2019 10:42   CT HEAD CODE STROKE WO CONTRAST  Result Date: 04/17/2019 CLINICAL DATA:  Code stroke. Left-sided facial droop/weakness, last known well noon yesterday EXAM: CT HEAD WITHOUT CONTRAST TECHNIQUE: Contiguous axial images were obtained from the base of the skull through the vertex without intravenous contrast. COMPARISON:  No pertinent prior studies available for comparison. FINDINGS: Brain: There is multifocal abnormal hypodensity consistent with acute ischemic infarction within the right MCA vascular territory involving the right frontal lobe, right parietal  lobe, portions of the posterior right temporal lobe and right insular cortex. Subtle petechial hemorrhage is questioned within a region of infarction within the anterior right frontal lobe (series 2, image 19). No significant mass effect or midline shift. No extra-axial fluid collection. Background mild ill-defined hypoattenuation within the cerebral white matter is nonspecific, but consistent with chronic small vessel ischemic disease. Mild generalized parenchymal atrophy. Vascular: Abnormal hyperdensity of the M1 right MCA likely reflecting thrombus. Atherosclerotic calcifications. Skull: No calvarial fracture or suspicious osseous lesion. Sinuses/Orbits: Visualized orbits demonstrate no acute abnormality. Mild paranasal sinus mucosal thickening. No significant mastoid effusion. ASPECTS ([place] Stroke Program Early CT Score) - Ganglionic level infarction (caudate, lentiform nuclei, internal capsule, insula, M1-M3 cortex): 4 (points subtracted for M2, M3 and insula) - Supraganglionic infarction (M4-M6 cortex): 0 Total score (0-10 with 10 being normal): 4 These results were communicated to Dr. Lorraine Lax At 10:38 amon 2/10/2021by text page via the [facility] messaging system. IMPRESSION: 1. 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 ASPECTS 4. Subtle petechial hemorrhage is questioned within an anterior right frontal lobe infarct site. No significant mass effect. 2. Hyperdensity of the M1 right MCA likely reflecting thrombus. 3. Mild generalized parenchymal atrophy and chronic small vessel ischemic disease. Electronically Signed   By: Kellie Simmering DO  On: 04/17/2019 10:38   VAS [place] LOWER EXTREMITY VENOUS (DVT)  Result Date: 04/18/2019  Lower Venous DVTStudy Indications: Elevated Ddimer.  Risk Factors: COVID 19 positive. Limitations: Bandages, open wound and patient positioning. Comparison Study: No prior studies. Performing Technologist: Oliver Hum RVT  Examination  Guidelines: A complete evaluation includes B-mode imaging, spectral Doppler, color Doppler, and power Doppler as needed of all accessible portions of each vessel. Bilateral testing is considered an integral part of a complete examination. Limited examinations for reoccurring indications may be performed as noted. The reflux portion of the exam is performed with the patient in reverse Trendelenburg.  +---------+---------------+---------+-----------+----------+--------------+ RIGHT    CompressibilityPhasicitySpontaneityPropertiesThrombus Aging +---------+---------------+---------+-----------+----------+--------------+ CFV                                                   Not visualized +---------+---------------+---------+-----------+----------+--------------+ SFJ                                                   Not visualized +---------+---------------+---------+-----------+----------+--------------+ FV Prox  Full           Yes      Yes                                 +---------+---------------+---------+-----------+----------+--------------+ FV Mid   Full                                                        +---------+---------------+---------+-----------+----------+--------------+ FV DistalFull                                                        +---------+---------------+---------+-----------+----------+--------------+ PFV      Full                                                        +---------+---------------+---------+-----------+----------+--------------+ POP      Full           Yes      Yes                                 +---------+---------------+---------+-----------+----------+--------------+ PTV      Full                                                        +---------+---------------+---------+-----------+----------+--------------+ PERO     Full                                                         +---------+---------------+---------+-----------+----------+--------------+  Unable to visualize the CFV, SFJ due to bandages.  +---------+---------------+---------+-----------+----------+--------------+ LEFT     CompressibilityPhasicitySpontaneityPropertiesThrombus Aging +---------+---------------+---------+-----------+----------+--------------+ CFV      Full           Yes      Yes                                 +---------+---------------+---------+-----------+----------+--------------+ SFJ      Full                                                        +---------+---------------+---------+-----------+----------+--------------+ FV Prox  Full                                                        +---------+---------------+---------+-----------+----------+--------------+ FV Mid   Full                                                        +---------+---------------+---------+-----------+----------+--------------+ FV DistalFull                                                        +---------+---------------+---------+-----------+----------+--------------+ PFV      Full                                                        +---------+---------------+---------+-----------+----------+--------------+ POP      Full           Yes      Yes                                 +---------+---------------+---------+-----------+----------+--------------+ PTV      Full                                                        +---------+---------------+---------+-----------+----------+--------------+ PERO     Full                                                        +---------+---------------+---------+-----------+----------+--------------+ Gastroc  None  Acute          +---------+---------------+---------+-----------+----------+--------------+     Summary: RIGHT: - There is no evidence of deep vein thrombosis in the  lower extremity.  - No cystic structure found in the popliteal fossa.  LEFT: - Findings consistent with acute deep vein thrombosis involving the left gastrocnemius veins. - No cystic structure found in the popliteal fossa.  *See table(s) above for measurements and observations. Electronically signed by Servando Snare MD on 04/18/2019 at 6:33:22 PM.    Final    ECHOCARDIOGRAM LIMITED  Result Date: 04/18/2019    ECHOCARDIOGRAM LIMITED REPORT   Patient Name:   Henry [name] Date of Exam: 04/17/2019 Medical Rec #:  [id number]       Height:       68.0 in Accession #:    [id number]      Weight:       180.0 lb Date of Birth:  May 26, 1940       BSA:          1.95 m Patient Age:    32 years        BP:           141/51 mmHg Patient Gender: M               HR:           57 bpm. Exam Location:  Inpatient Procedure: Limited Echo and Cardiac Doppler Indications:    Stroke 434.91 / I163.9  History:        Patient has no prior history of Echocardiogram examinations.                 Signs/Symptoms:Chest Pain; Risk Factors:Hypertension. Pneumonia                 due to COVID-19 virus.  Sonographer:    Jaquita Folds Referring Phys: Bruin  1. Left ventricular ejection fraction, by estimation, is 55 to 60%. The left ventricle has normal function. The left ventrical has no regional wall motion abnormalities. Left ventricular diastolic parameters are indeterminate.  2. Right ventricular systolic function is normal. The right ventricular size is normal. There is normal pulmonary artery systolic pressure.  3. The mitral valve is normal in structure and function. trivial mitral valve regurgitation. No evidence of mitral stenosis.  4. The aortic valve is normal in structure and function. Aortic valve regurgitation is not visualized. No aortic stenosis is present. FINDINGS  Left Ventricle: Left ventricular ejection fraction, by estimation, is 55 to 60%. The left ventricle has normal function. The left ventricle  has no regional wall motion abnormalities. There is no left ventricular hypertrophy. Right Ventricle: The right ventricular size is normal. No increase in right ventricular wall thickness. Right ventricular systolic function is normal. There is normal pulmonary artery systolic pressure. The tricuspid regurgitant velocity is 1.86 m/s, and  with an assumed right atrial pressure of 3 mmHg, the estimated right ventricular systolic pressure is 70.3 mmHg. Mitral Valve: The mitral valve is normal in structure and function. Trivial mitral valve regurgitation. No evidence of mitral valve stenosis. Tricuspid Valve: The tricuspid valve is grossly normal. Tricuspid valve regurgitation is mild. Aortic Valve: The aortic valve is normal in structure and function. Aortic valve regurgitation is not visualized. No aortic stenosis is present. Aorta: The aortic root, ascending aorta and aortic arch are all structurally normal, with no evidence of dilitation or obstruction.  LEFT VENTRICLE PLAX 2D LVIDd:  4.60 cm  Diastology LVIDs:         3.60 cm  LV e' lateral:   8.04 cm/s LV PW:         0.90 cm  LV E/e' lateral: 10.1 LV IVS:        0.90 cm  LV e' medial:    5.91 cm/s LVOT diam:     2.00 cm  LV E/e' medial:  13.8 LV SV:         55.61 ml LV SV Index:   21.50 LVOT Area:     3.14 cm  LEFT ATRIUM             Index LA diam:        4.30 cm 2.20 cm/m LA Vol (A2C):   50.7 ml 25.94 ml/m LA Vol (A4C):   43.8 ml 22.41 ml/m LA Biplane Vol: 48.7 ml 24.92 ml/m  AORTIC VALVE LVOT Vmax:   79.40 cm/s LVOT Vmean:  57.900 cm/s LVOT VTI:    0.177 m  AORTA Ao Root diam: 2.70 cm MITRAL VALVE                        TRICUSPID VALVE MV Area (PHT): 2.50 cm             TR Peak grad:   13.8 mmHg MV Decel Time: 303 msec             TR Vmax:        186.00 cm/s MV E velocity: 81.60 cm/s 103 cm/s MV A velocity: 74.40 cm/s 70.3 cm/s SHUNTS MV E/A ratio:  1.10       1.5       Systemic VTI:  0.18 m                                     Systemic Diam: 2.00 cm  Mertie Moores MD Electronically signed by Mertie Moores MD Signature Date/Time: 04/18/2019/5:07:36 PM    Final     Assessment & Plan:   There are no diagnoses linked to this encounter.   No orders of the defined types were placed in this encounter.    Follow-up: No follow-ups on file.  Walker Kehr, MD

## 2019-08-26 NOTE — Assessment & Plan Note (Signed)
DVT in the R leg - Lower extremity venous Doppler showed DVT in the left gastrocnemius vein. On Eliquis

## 2019-08-28 DIAGNOSIS — H538 Other visual disturbances: Secondary | ICD-10-CM | POA: Diagnosis not present

## 2019-08-28 DIAGNOSIS — I69312 Visuospatial deficit and spatial neglect following cerebral infarction: Secondary | ICD-10-CM | POA: Diagnosis not present

## 2019-08-28 DIAGNOSIS — I69398 Other sequelae of cerebral infarction: Secondary | ICD-10-CM | POA: Diagnosis not present

## 2019-08-28 DIAGNOSIS — G8114 Spastic hemiplegia affecting left nondominant side: Secondary | ICD-10-CM | POA: Diagnosis not present

## 2019-09-03 DIAGNOSIS — G8114 Spastic hemiplegia affecting left nondominant side: Secondary | ICD-10-CM | POA: Diagnosis not present

## 2019-09-03 DIAGNOSIS — H538 Other visual disturbances: Secondary | ICD-10-CM | POA: Diagnosis not present

## 2019-09-03 DIAGNOSIS — I69398 Other sequelae of cerebral infarction: Secondary | ICD-10-CM | POA: Diagnosis not present

## 2019-09-03 DIAGNOSIS — I69312 Visuospatial deficit and spatial neglect following cerebral infarction: Secondary | ICD-10-CM | POA: Diagnosis not present

## 2019-09-05 ENCOUNTER — Encounter: Payer: Self-pay | Admitting: Internal Medicine

## 2019-09-05 ENCOUNTER — Ambulatory Visit (INDEPENDENT_AMBULATORY_CARE_PROVIDER_SITE_OTHER): Payer: Medicare HMO | Admitting: Internal Medicine

## 2019-09-05 ENCOUNTER — Other Ambulatory Visit: Payer: Self-pay

## 2019-09-05 ENCOUNTER — Ambulatory Visit (INDEPENDENT_AMBULATORY_CARE_PROVIDER_SITE_OTHER): Payer: Medicare HMO

## 2019-09-05 VITALS — BP 126/82 | HR 79 | Temp 98.5°F | Ht 68.0 in | Wt 149.0 lb

## 2019-09-05 DIAGNOSIS — R413 Other amnesia: Secondary | ICD-10-CM | POA: Diagnosis not present

## 2019-09-05 DIAGNOSIS — R410 Disorientation, unspecified: Secondary | ICD-10-CM | POA: Insufficient documentation

## 2019-09-05 DIAGNOSIS — I69354 Hemiplegia and hemiparesis following cerebral infarction affecting left non-dominant side: Secondary | ICD-10-CM | POA: Diagnosis not present

## 2019-09-05 LAB — COMPREHENSIVE METABOLIC PANEL
ALT: 21 U/L (ref 0–53)
AST: 21 U/L (ref 0–37)
Albumin: 4.3 g/dL (ref 3.5–5.2)
Alkaline Phosphatase: 71 U/L (ref 39–117)
BUN: 16 mg/dL (ref 6–23)
CO2: 32 mEq/L (ref 19–32)
Calcium: 9.9 mg/dL (ref 8.4–10.5)
Chloride: 101 mEq/L (ref 96–112)
Creatinine, Ser: 0.9 mg/dL (ref 0.40–1.50)
GFR: 81.73 mL/min (ref >60.00)
Glucose, Bld: 109 mg/dL — ABNORMAL HIGH (ref 70–99)
Potassium: 3.7 mEq/L (ref 3.5–5.1)
Sodium: 140 mEq/L (ref 135–145)
Total Bilirubin: 0.5 mg/dL (ref 0.2–1.2)
Total Protein: 7.2 g/dL (ref 6.0–8.3)

## 2019-09-05 LAB — CBC
HCT: 38.5 % — ABNORMAL LOW (ref 39.0–52.0)
Hemoglobin: 13.2 g/dL (ref 13.0–17.0)
MCHC: 34.4 g/dL (ref 30.0–36.0)
MCV: 109.9 fl — ABNORMAL HIGH (ref 78.0–100.0)
Platelets: 189 10*3/uL (ref 150.0–400.0)
RBC: 3.5 Mil/uL — ABNORMAL LOW (ref 4.22–5.81)
RDW: 14.1 % (ref 11.5–15.5)
WBC: 6.7 10*3/uL (ref 4.0–10.5)

## 2019-09-05 LAB — POCT URINALYSIS DIPSTICK
Bilirubin, UA: NEGATIVE
Blood, UA: NEGATIVE
Glucose, UA: NEGATIVE
Ketones, UA: NEGATIVE
Leukocytes, UA: NEGATIVE
Nitrite, UA: NEGATIVE
Protein, UA: POSITIVE — AB
Spec Grav, UA: 1.025 (ref 1.010–1.025)
Urobilinogen, UA: 0.2 E.U./dL
pH, UA: 6 (ref 5.0–8.0)

## 2019-09-05 LAB — VITAMIN D 25 HYDROXY (VIT D DEFICIENCY, FRACTURES): VITD: 65.74 ng/mL (ref 30.00–100.00)

## 2019-09-05 LAB — TSH: TSH: 1.24 u[IU]/mL (ref 0.35–4.50)

## 2019-09-05 LAB — VITAMIN B12: Vitamin B-12: 350 pg/mL (ref 211–911)

## 2019-09-05 NOTE — Assessment & Plan Note (Signed)
U/A done in the office without signs of infection. Checking CXR given persistent findings on CXR after Covid-19 from March. Also checking CBC, CMP to rule out infection.

## 2019-09-05 NOTE — Assessment & Plan Note (Signed)
We talked about how the short term memory loss is likely due to stroke and may be permanent or not. Checking B12, vitamin D, TSH to rule out metabolic etiology.

## 2019-09-05 NOTE — Progress Notes (Signed)
° °  Subjective:   Patient ID: Henry [name], male    DOB: [birth year], 79 y.o.   MRN: [id number]  HPI The patient is a 79 YO man coming in for confusion (in the last week he is having some more confusion and mental change from usual, thinking things that are not right, wife provides history but cannot provide specific examples, he and she deny that he is hallucinating either visually or auditory, denies new cough or SOB, denies nasal congestion, denies fevers or chills, denies urinary incontinence or pain on urination, denies blood in stool or dark stools) and memory concerns (since stroke short term memory is poor, asking questions again within 30 minutes of the information being given to him, no loss to long term memory, no change in judgement) and loss of function left arm (this is causing him a lot of emotional angst due to liking to work with his tools, can now move the left arm but grip and strength is not normal and cannot do what he wants, still working on this).   Review of Systems  Constitutional: Negative.   HENT: Negative.   Eyes: Negative.   Respiratory: Negative for cough, chest tightness and shortness of breath.   Cardiovascular: Negative for chest pain, palpitations and leg swelling.  Gastrointestinal: Negative for abdominal distention, abdominal pain, constipation, diarrhea, nausea and vomiting.  Musculoskeletal: Negative.   Skin: Negative.   Neurological: Negative.        Memory change, confusion  Psychiatric/Behavioral: Positive for confusion, decreased concentration and sleep disturbance. Negative for agitation and behavioral problems.    Objective:  Physical Exam Constitutional:      Appearance: He is well-developed.  HENT:     Head: Normocephalic and atraumatic.  Cardiovascular:     Rate and Rhythm: Normal rate and regular rhythm.  Pulmonary:     Effort: Pulmonary effort is normal. No respiratory distress.     Breath sounds: Normal breath sounds. No wheezing or  rales.  Abdominal:     General: Bowel sounds are normal. There is no distension.     Palpations: Abdomen is soft.     Tenderness: There is no abdominal tenderness. There is no rebound.  Musculoskeletal:     Cervical back: Normal range of motion.  Skin:    General: Skin is warm and dry.  Neurological:     Mental Status: He is alert and oriented to person, place, and time. Mental status is at baseline.     Coordination: Coordination normal.     Comments: Left arm and left with weakness     Vitals:   09/05/19 1306  BP: 126/82  Pulse: 79  Temp: 98.5 F (36.9 C)  TempSrc: Oral  SpO2: 96%  Weight: 149 lb (67.6 kg)  Height: 5\' 8"  (1.727 m)    This visit occurred during the SARS-CoV-2 public health emergency.  Safety protocols were in place, including screening questions prior to the visit, additional usage of staff PPE, and extensive cleaning of exam room while observing appropriate contact time as indicated for disinfecting solutions.   Assessment & Plan:

## 2019-09-05 NOTE — Assessment & Plan Note (Signed)
He is still working on strength and mobility at home with some improvements. We discussed how this is still possible to improve even up to 1-2 years after stroke. He is less than 6 months out from stroke.

## 2019-09-05 NOTE — Patient Instructions (Signed)
We will check blood work today and check the chest x-ray.

## 2019-09-06 DIAGNOSIS — I69398 Other sequelae of cerebral infarction: Secondary | ICD-10-CM | POA: Diagnosis not present

## 2019-09-06 DIAGNOSIS — G8114 Spastic hemiplegia affecting left nondominant side: Secondary | ICD-10-CM | POA: Diagnosis not present

## 2019-09-06 DIAGNOSIS — I69312 Visuospatial deficit and spatial neglect following cerebral infarction: Secondary | ICD-10-CM | POA: Diagnosis not present

## 2019-09-06 DIAGNOSIS — H538 Other visual disturbances: Secondary | ICD-10-CM | POA: Diagnosis not present

## 2019-09-10 ENCOUNTER — Encounter: Payer: Self-pay | Admitting: Internal Medicine

## 2019-09-10 ENCOUNTER — Ambulatory Visit
Admission: RE | Admit: 2019-09-10 | Discharge: 2019-09-10 | Disposition: A | Discharge status: Home / Self Care | Payer: Medicare HMO | Source: Ambulatory Visit | Attending: Internal Medicine | Admitting: Internal Medicine

## 2019-09-10 ENCOUNTER — Other Ambulatory Visit: Payer: Self-pay

## 2019-09-10 ENCOUNTER — Ambulatory Visit (INDEPENDENT_AMBULATORY_CARE_PROVIDER_SITE_OTHER): Payer: Medicare HMO | Admitting: Internal Medicine

## 2019-09-10 DIAGNOSIS — I10 Essential (primary) hypertension: Secondary | ICD-10-CM | POA: Diagnosis not present

## 2019-09-10 DIAGNOSIS — I69312 Visuospatial deficit and spatial neglect following cerebral infarction: Secondary | ICD-10-CM | POA: Diagnosis not present

## 2019-09-10 DIAGNOSIS — G8114 Spastic hemiplegia affecting left nondominant side: Secondary | ICD-10-CM | POA: Diagnosis not present

## 2019-09-10 DIAGNOSIS — R413 Other amnesia: Secondary | ICD-10-CM

## 2019-09-10 DIAGNOSIS — R410 Disorientation, unspecified: Secondary | ICD-10-CM

## 2019-09-10 DIAGNOSIS — S0990XA Unspecified injury of head, initial encounter: Secondary | ICD-10-CM | POA: Diagnosis not present

## 2019-09-10 DIAGNOSIS — D751 Secondary polycythemia: Secondary | ICD-10-CM | POA: Diagnosis not present

## 2019-09-10 DIAGNOSIS — H538 Other visual disturbances: Secondary | ICD-10-CM | POA: Diagnosis not present

## 2019-09-10 DIAGNOSIS — F22 Delusional disorders: Secondary | ICD-10-CM

## 2019-09-10 DIAGNOSIS — I69398 Other sequelae of cerebral infarction: Secondary | ICD-10-CM | POA: Diagnosis not present

## 2019-09-10 MED ORDER — QUETIAPINE FUMARATE 25 MG PO TABS: 25.0000 mg | ORAL_TABLET | Freq: Every day | ORAL | 5 refills | Status: DC

## 2019-09-10 NOTE — Progress Notes (Signed)
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 microcatheter. The guidewire was removed. Good aspiration was obtained from the hub of the microcatheter. Gentle contrast injection demonstrated safe position of tip of the microcatheter. This was then connected to continuous heparinized saline infusion.  A 4 mm x 40 mm Solitaire X retrieval device was then advanced to the distal end of the microcatheter. The O ring on the delivery microcatheter was then loosened. With slight forward gentle traction with the right hand on the delivery micro guidewire with left hand the delivery microcatheter was retrieved unsheathing the retrieval device.  The Catalyst 6 [place] guide catheter was advanced to the occluded right middle cerebral artery. With proximal flow arrest obtained by inflating the balloon in the right internal carotid artery, and after constant aspiration using a Penumbra aspiration device at the hub of the 6 [place] Catalyst guide catheter, and with a 60 mL syringe at the hub of the balloon guide catheter, the combination of the retrieval device, the microcatheter, and the 6 [place] Catalyst guide were retrieved and removed as constant aspiration was applied at the hub of the balloon guide catheter.  Proximal flow arrest was reversed. A control  arteriogram performed through the balloon guide catheter which had been advanced into the mid right internal carotid artery demonstrated complete opacification of the right middle cerebral artery and the inferior division. However, the superior division remained occluded.  A second pass was then made again using the above combination. With the micro guidewire leading, access into the M3 region of the superior division was obtained with a microcatheter. The micro guidewire was removed. Good aspiration was established at the hub of the microcatheter. A gentle control arteriogram again demonstrates safe positioning of the tip of the microcatheter which was then connected to continuous heparinized saline infusion. Again with constant flow arrest in the right internal carotid artery, and aspiration at the hub of the Catalyst guide catheter using the Penumbra aspiration device at the origin of the superior division for 2 minutes, and with a 60 mL syringe at the hub of the balloon guide catheter, the combination of the retrieval device, the microcatheter and the 6 [place] Catalyst guide catheter were retrieved and removed. Following reversal  of flow arrest, control arteriogram performed through the balloon guide catheter in the right internal carotid artery now demonstrated revascularization of the superior division and also further improved caliber of the inferior division.  There continued to be a very small branch extending into the anterior temporal region which was occluded in the M2 region. Also noted was a distal M3 M4 junction branch occlusion of a parietal branch of the inferior division.  A TICI 2C revascularization had been achieved.  The right anterior cerebral artery remained widely patent with cross-filling via the anterior communicating artery of the left anterior cerebral A2 segment and distally.  The posterior communicating artery remain widely patent.  Moderate spasm in the distal cervical right ICA,  and the superior division of the right middle cerebral artery responded to 2 aliquots of 25 mcg of nitroglycerin intra-arterially.  A final control arteriogram performed through the balloon guide catheter in the right common carotid artery demonstrated significantly improved caliber of the right internal carotid artery, with maintenance of a TICI 2c revascularization of the right middle cerebral artery distribution.  The balloon guide catheter was removed. The right groin 8 French Pinnacle sheath was then removed with manual compression in the right groin. Distal pulses remained Dopplerable in the dorsalis pedis, and the posterior tibial regions bilaterally unchanged.  A flat panel CT of the brain demonstrated heterogeneous hyper attenuation in the right temporal lobe region anteriorly probably representing contrast stain versus less likely hemorrhagic conversion of infarct tissue.  There was no mass-effect noted on the right lateral ventricle. Patient was then transferred to the ICU unit for post thrombectomy management. Patient was left intubated on account of his COVID status.  IMPRESSION: Endovascular near complete revascularization of the right middle cerebral artery territory with 2 passes with the 4 mm x 40 mm Solitaire X retrieval device and Penumbra aspiration achieving a TICI 2c revascularization.  PLAN: Follow-up as per referring MD.   Electronically Signed   By: Luanne Bras M.D.   On: 04/18/2019 10:42   CT CEREBRAL PERFUSION W CONTRAST  Result Date: 04/17/2019 CLINICAL DATA:  Stroke, follow-up. EXAM: CT ANGIOGRAPHY HEAD AND NECK CT PERFUSION BRAIN TECHNIQUE: Multidetector CT imaging of the head and neck was performed using the standard protocol during bolus administration of intravenous contrast. Multiplanar CT image reconstructions and MIPs were obtained to evaluate the vascular anatomy. Carotid stenosis measurements (when applicable) are obtained utilizing NASCET criteria, using the  distal internal carotid diameter as the denominator. Multiphase CT imaging of the brain was performed following IV bolus contrast injection. Subsequent parametric perfusion maps were calculated using RAPID software. CONTRAST:  18mL OMNIPAQUE IOHEXOL 350 MG/ML SOLN COMPARISON:  Noncontrast head CT performed earlier the same day. FINDINGS: CTA NECK FINDINGS Aortic arch: The origins of the innominate and left common carotid arteries are incompletely imaged. The visualized aortic arch is unremarkable. No significant innominate or proximal subclavian stenosis visualized. Right carotid system: CCA patent to the bifurcation without stenosis. Prominent predominantly calcified plaque within the distal common carotid artery and proximal ICA. There is likely greater than 70% stenosis at the origin of the ICA, although exact quantification of stenosis is difficult due to irregularity of calcified plaque. Distal to this the ICA is patent within the neck without stenosis. Left carotid system: CCA patent to the bifurcation without stenosis. Calcified plaque within the distal common carotid artery, carotid bifurcation and proximal ICA. Resultant 30-40% narrowing of the proximal ICA as compared to the more distal vessel. Distal to this the  ICA is patent within the neck without stenosis. Vertebral arteries: Right vertebral artery dominant. 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 Calcified plaque at the origin of the medial V1 left vertebral artery branch with at least moderate ostial stenosis. More distally the bilateral vertebral arteries are patent within the neck without stenosis. Skeleton: No acute bony abnormality. Cervical spondylosis with C5-C6 and C6-C7 posterior disc osteophytes. Other neck: No neck mass or cervical adenopathy. Upper chest: Paraseptal emphysema within the imaged lung apices. Also within the imaged lung apices but there is interstitial prominence suspicious for interstitial lung disease. There are more  nodular opacities within the right upper lobe measuring up to 1.4 cm (series 7, image 56) (series 7, image 99). Review of the MIP images confirms the above findings CTA HEAD FINDINGS Anterior circulation: The intracranial internal carotid arteries are patent with scattered calcified plaque. No more than mild stenosis within these vessels. 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 There is minimal reconstitution of flow seen within M2 and more distal right MCA branch vessels. The left middle cerebral artery is patent without high-grade proximal stenosis. The bilateral anterior cerebral arteries are patent without significant proximal stenosis. Posterior circulation: The intracranial vertebral arteries are patent without significant stenosis, as is the basilar artery. The bilateral posterior cerebral arteries are patent without significant proximal stenosis. A sizable right posterior communicating artery is present. The a left posterior communicating artery is poorly delineated and may be hypoplastic or absent. Venous sinuses: Within limitations of contrast timing, no convincing thrombus. Anatomic variants: As described Review of the MIP images confirms the above findings CT Brain Perfusion Findings: ASPECTS: 4 CBF (<30%) Volume: 5mL (in the right MCA vascular territory) Perfusion (Tmax>6.0s) volume: 147 mL (in the right MCA vascular territory) Mismatch Volume: 149mL Infarction Location:Right MCA vascular territory Findings of a proximal M1 right MCA occlusion and the perfusion results were discussed by telephone at the time of interpretation on 04/17/2019 at 10:38 am to provider Dr. Lorraine Lax, who verbally acknowledged these results. IMPRESSION: CTA neck: 1. The common and internal carotid arteries are patent within the neck bilaterally. 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 There is likely greater than 70% stenosis at the origin of the right ICA. 30-40% stenosis of the proximal  left ICA. 2. The vertebral arteries are patent within the neck bilaterally. The right vertebral artery is dominant with moderate atherosclerotic narrowing at its origin. Duplicated V1 left vertebral artery. There is at least moderate ostial stenosis at the origin of the medial left V1 vertebral artery branch. 3. The partially imaged lung apices demonstrate findings suspicious for interstitial lung disease. Additionally, there are nodular opacities within the right lung apex measuring up to 1.4 cm. Dedicated chest CT is recommended for further evaluation when clinically feasible. CTA head: 1. 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 There is minimal reconstitution of flow within M2 and more distal right MCA branch vessels. 2. No other intracranial large vessel occlusion or proximal high-grade arterial stenosis identified. CT perfusion head: The perfusion software identifies a 10 mL core infarct within the right MCA vascular territory. However, please note more extensive infarction changes were appreciated on concurrently performed noncontrast head CT. The perfusion software detects a 147 mL region of hypoperfused parenchyma within the right MCA vascular territory. Reported mismatch 137 mL. Electronically Signed   By: Kellie Simmering DO   On: 04/17/2019 11:04   DG Chest Port 1 View  Result Date: 04/18/2019 CLINICAL  DATA:  Acute respiratory failure EXAM: PORTABLE CHEST 1 VIEW COMPARISON:  Radiograph 04/17/2019 FINDINGS: *Endotracheal tube in the mid trachea, 5.5 cm from the carina. *Telemetry leads overlie the chest. Slight improvement in the bilateral airspace disease most pronounced in the left base and periphery and right mid lung. No new areas of airspace disease. No pneumothorax or visible effusion the portion of the costophrenic sulci on the left is collimated. Cardiomediastinal contours are stable with a calcified aorta. No acute osseous or soft tissue abnormality. IMPRESSION: Stable satisfactory  positioning of the endotracheal tube. Slight interval improvement of bilateral airspace disease. Electronically Signed   By: Lovena Le M.D.   On: 04/18/2019 06:15   DG Chest Port 1 View  Result Date: 04/17/2019 CLINICAL DATA:  COVID-19. Intubation. EXAM: PORTABLE CHEST 1 VIEW COMPARISON:  Radiograph yesterday. FINDINGS: Endotracheal tube tip 5.8 cm from the carina. Probable esophageal temperature probe with tip at the level of the clavicular heads. No definite enteric tube is visualized. Patchy bilateral airspace disease within both lungs, slight worsening in the right midlung zone from prior. Unchanged heart size and mediastinal contours. No pneumothorax or large pleural effusion. No acute osseous abnormalities are seen. IMPRESSION: 1. Bilateral pneumonia, with slight worsening in the right mid lung since yesterday. 2. Endotracheal tube tip 5.8 cm from the carina. Probable esophageal temperature probe with tip at the clavicular heads. Electronically Signed   By: Keith Rake M.D.   On: 04/17/2019 16:45   IR PERCUTANEOUS ART THROMBECTOMY/INFUSION INTRACRANIAL INC DIAG ANGIO  Result Date: 04/19/2019 INDICATION: New onset of left-sided weakness with right gaze deviation and dysarthria. 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 EXAM: 1. EMERGENT LARGE VESSEL OCCLUSION THROMBOLYSIS (anterior CIRCULATION) COMPARISON:  CT angiogram of the head and neck of April 17, 2019. MEDICATIONS: Ancef 2 g IV antibiotic was administered within 1 hour of the procedure. ANESTHESIA/SEDATION: General anesthesia. CONTRAST:  Isovue 300 approximately 65 mL. FLUOROSCOPY TIME:  Fluoroscopy Time: 41 minutes 18 seconds (1531 mGy). COMPLICATIONS: None immediate. TECHNIQUE: Following a full explanation of the procedure along with the potential associated complications, an informed witnessed consent was obtained from the patient's wife and daughter. The risks of intracranial hemorrhage of  10%, worsening neurological deficit, ventilator dependency, death and inability to revascularize were all reviewed in detail with the patient's wife and daughter. The patient was then put under general anesthesia by the Department of Anesthesiology at [facility]. The right groin was prepped and draped in the usual sterile fashion. Thereafter using modified Seldinger technique, transfemoral access into the right common femoral artery was obtained without difficulty. Over a 0.035 inch guidewire an 8 [place] Pinnacle sheath was inserted. Through this, and also over a 0.035 inch guidewire a 5 [place] JB 1 catheter was advanced to the aortic arch region and selectively positioned in the innominate artery and the right common carotid artery. FINDINGS: The innominate artery arteriogram demonstrates the origin of the right subclavian artery and the right common carotid artery to be widely patent. The right common carotid arteriogram demonstrates the right external carotid artery and its major branches to be widely patent. The right internal carotid artery at the bulb to the cranial skull base demonstrates wide patency with mild FMD-like changes involving the mid cervical right ICA. The petrous, cavernous and the supraclinoid segments are widely patent. A right posterior communicating artery is seen opacifying the right posterior cerebral distribution. 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  The right anterior cerebral artery opacifies into the capillary and venous phases. PROCEDURE: The diagnostic JB 1 catheter in right common carotid artery was exchanged over an 8 [place] 300 cm Rosen exchange guidewire for an 087 balloon guide catheter which had been prepped with 50% contrast and 50% heparinized saline infusion. This was advanced to the right common carotid artery just proximal to the origin of the right internal carotid artery. The guidewire was removed. Good aspiration was  obtained from the hub of the balloon guide catheter. Gentle contrast injection demonstrated no evidence of spasms or of intraluminal filling defects. Over a 0.014 inch standard Synchro micro guidewire with a J configuration, the combination of the 021 Trevo ProVue microcatheter inside of a 6 [place] Catalyst 132 cm guide catheter was advanced to the distal end of the balloon guide catheter. With the micro guidewire leading with a J-tip configuration to avoid dissections or inducing spasm, the combination was navigated without difficulty to the supraclinoid right ICA. The occluded right middle cerebral artery was entered with a micro guidewire which was advanced to the M3 regions of the inferior division of the right middle cerebral artery followed by the microcatheter. The guidewire was removed. Good aspiration was obtained from the hub of the microcatheter. Gentle contrast injection demonstrated safe position of tip of the microcatheter. This was then connected to continuous heparinized saline infusion. A 4 mm x 40 mm Solitaire X retrieval device was then advanced to the distal end of the microcatheter. The O ring on the delivery microcatheter was then loosened. With slight forward gentle traction with the right hand on the delivery micro guidewire with left hand the delivery microcatheter was retrieved unsheathing the retrieval device. The Catalyst 6 [place] guide catheter was advanced to the occluded right middle cerebral artery. With proximal flow arrest obtained by inflating the balloon in the right internal carotid artery, and after constant aspiration using a Penumbra aspiration device at the hub of the 6 [place] Catalyst guide catheter, and with a 60 mL syringe at the hub of the balloon guide catheter, the combination of the retrieval device, the microcatheter, and the 6 [place] Catalyst guide were retrieved and removed as constant aspiration was applied at the hub of the balloon guide catheter. Proximal flow  arrest was reversed. A control arteriogram performed through the balloon guide catheter which had been advanced into the mid right internal carotid artery demonstrated complete opacification of the right middle cerebral artery and the inferior division. However, the superior division remained occluded. A second pass was then made again using the above combination. With the micro guidewire leading, access into the M3 region of the superior division was obtained with a microcatheter. The micro guidewire was removed. Good aspiration was established at the hub of the microcatheter. A gentle control arteriogram again demonstrates safe positioning of the tip of the microcatheter which was then connected to continuous heparinized saline infusion. Again with constant flow arrest in the right internal carotid artery, and aspiration at the hub of the Catalyst guide catheter using the Penumbra aspiration device at the origin of the superior division for 2 minutes, and with a 60 mL syringe at the hub of the balloon guide catheter, the combination of the retrieval device, the microcatheter and the 6 [place] Catalyst guide catheter were retrieved and removed. Following reversal of flow arrest, control arteriogram performed through the balloon guide catheter in the right internal carotid artery now demonstrated revascularization of the superior division and also further improved caliber of  the inferior division. There continued to be a very small branch extending into the anterior temporal region which was occluded in the M2 region. Also noted was a distal M3 M4 junction branch occlusion of a parietal branch of the inferior division. A TICI 2C revascularization had been achieved. The right anterior cerebral artery remained widely patent with cross-filling via the anterior communicating artery of the left anterior cerebral A2 segment and distally. The posterior communicating artery remain widely patent. Moderate spasm in the distal  cervical right ICA, and the superior division of the right middle cerebral artery responded to 2 aliquots of 25 mcg of nitroglycerin intra-arterially. A final control arteriogram performed through the balloon guide catheter in the right common carotid artery demonstrated significantly improved caliber of the right internal carotid artery, with maintenance of a TICI 2c revascularization of the right middle cerebral artery distribution. The balloon guide catheter was removed. The right groin 8 [place] Pinnacle sheath was then removed with manual compression in the right groin. Distal pulses remained Dopplerable in the dorsalis pedis, and the posterior tibial regions bilaterally unchanged. A flat panel CT of the brain demonstrated heterogeneous hyper attenuation in the right temporal lobe region anteriorly probably representing contrast stain versus less likely hemorrhagic conversion of infarct tissue. There was no mass-effect noted on the right lateral ventricle. Patient was then transferred to the ICU unit for post thrombectomy management. Patient was left intubated on account of his COVID status. IMPRESSION: Endovascular near complete revascularization of the right middle cerebral artery territory with 2 passes with the 4 mm x 40 mm Solitaire X retrieval device and Penumbra aspiration achieving a TICI 2c revascularization. PLAN: Follow-up as per referring MD. Electronically Signed   By: Luanne Bras M.D.   On: 04/18/2019 10:42   CT HEAD CODE STROKE WO CONTRAST  Result Date: 04/17/2019 CLINICAL DATA:  Code stroke. Left-sided facial droop/weakness, last known well noon yesterday EXAM: CT HEAD WITHOUT CONTRAST TECHNIQUE: Contiguous axial images were obtained from the base of the skull through the vertex without intravenous contrast. COMPARISON:  No pertinent prior studies available for comparison. FINDINGS: Brain: There is multifocal abnormal hypodensity consistent with acute ischemic infarction within the  right MCA vascular territory involving the right frontal lobe, right parietal lobe, portions of the posterior right temporal lobe and right insular cortex. Subtle petechial hemorrhage is questioned within a region of infarction within the anterior right frontal lobe (series 2, image 19). No significant mass effect or midline shift. No extra-axial fluid collection. Background mild ill-defined hypoattenuation within the cerebral white matter is nonspecific, but consistent with chronic small vessel ischemic disease. Mild generalized parenchymal atrophy. Vascular: Abnormal hyperdensity of the M1 right MCA likely reflecting thrombus. Atherosclerotic calcifications. Skull: No calvarial fracture or suspicious osseous lesion. Sinuses/Orbits: Visualized orbits demonstrate no acute abnormality. Mild paranasal sinus mucosal thickening. No significant mastoid effusion. ASPECTS ([place] Stroke Program Early CT Score) - Ganglionic level infarction (caudate, lentiform nuclei, internal capsule, insula, M1-M3 cortex): 4 (points subtracted for M2, M3 and insula) - Supraganglionic infarction (M4-M6 cortex): 0 Total score (0-10 with 10 being normal): 4 These results were communicated to Dr. Lorraine Lax At 10:38 amon 2/10/2021by text page via the [facility] messaging system. IMPRESSION: 1. 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 ASPECTS 4. Subtle petechial hemorrhage is questioned within an anterior right frontal lobe infarct site. No significant mass effect. 2. Hyperdensity of the M1 right MCA likely reflecting thrombus. 3. Mild generalized parenchymal atrophy and chronic small vessel  ischemic disease. Electronically Signed   By: Kellie Simmering DO   On: 04/17/2019 10:38   VAS [place] LOWER EXTREMITY VENOUS (DVT)  Result Date: 04/18/2019  Lower Venous DVTStudy Indications: Elevated Ddimer.  Risk Factors: COVID 19 positive. Limitations: Bandages, open wound and patient positioning. Comparison Study: No  prior studies. Performing Technologist: Oliver Hum RVT  Examination Guidelines: A complete evaluation includes B-mode imaging, spectral Doppler, color Doppler, and power Doppler as needed of all accessible portions of each vessel. Bilateral testing is considered an integral part of a complete examination. Limited examinations for reoccurring indications may be performed as noted. The reflux portion of the exam is performed with the patient in reverse Trendelenburg.  +---------+---------------+---------+-----------+----------+--------------+ RIGHT    CompressibilityPhasicitySpontaneityPropertiesThrombus Aging +---------+---------------+---------+-----------+----------+--------------+ CFV                                                   Not visualized +---------+---------------+---------+-----------+----------+--------------+ SFJ                                                   Not visualized +---------+---------------+---------+-----------+----------+--------------+ FV Prox  Full           Yes      Yes                                 +---------+---------------+---------+-----------+----------+--------------+ FV Mid   Full                                                        +---------+---------------+---------+-----------+----------+--------------+ FV DistalFull                                                        +---------+---------------+---------+-----------+----------+--------------+ PFV      Full                                                        +---------+---------------+---------+-----------+----------+--------------+ POP      Full           Yes      Yes                                 +---------+---------------+---------+-----------+----------+--------------+ PTV      Full                                                        +---------+---------------+---------+-----------+----------+--------------+ PERO     Full                                                         +---------+---------------+---------+-----------+----------+--------------+  Unable to visualize the CFV, SFJ due to bandages.  +---------+---------------+---------+-----------+----------+--------------+ LEFT     CompressibilityPhasicitySpontaneityPropertiesThrombus Aging +---------+---------------+---------+-----------+----------+--------------+ CFV      Full           Yes      Yes                                 +---------+---------------+---------+-----------+----------+--------------+ SFJ      Full                                                        +---------+---------------+---------+-----------+----------+--------------+ FV Prox  Full                                                        +---------+---------------+---------+-----------+----------+--------------+ FV Mid   Full                                                        +---------+---------------+---------+-----------+----------+--------------+ FV DistalFull                                                        +---------+---------------+---------+-----------+----------+--------------+ PFV      Full                                                        +---------+---------------+---------+-----------+----------+--------------+ POP      Full           Yes      Yes                                 +---------+---------------+---------+-----------+----------+--------------+ PTV      Full                                                        +---------+---------------+---------+-----------+----------+--------------+ PERO     Full                                                        +---------+---------------+---------+-----------+----------+--------------+ Gastroc  None  Acute          +---------+---------------+---------+-----------+----------+--------------+     Summary: RIGHT: - There  is no evidence of deep vein thrombosis in the lower extremity.  - No cystic structure found in the popliteal fossa.  LEFT: - Findings consistent with acute deep vein thrombosis involving the left gastrocnemius veins. - No cystic structure found in the popliteal fossa.  *See table(s) above for measurements and observations. Electronically signed by Servando Snare MD on 04/18/2019 at 6:33:22 PM.    Final    ECHOCARDIOGRAM LIMITED  Result Date: 04/18/2019    ECHOCARDIOGRAM LIMITED REPORT   Patient Name:   Henry [name] Date of Exam: 04/17/2019 Medical Rec #:  [id number]       Height:       68.0 in Accession #:    [id number]      Weight:       180.0 lb Date of Birth:  1940-05-10       BSA:          1.95 m Patient Age:    53 years        BP:           141/51 mmHg Patient Gender: M               HR:           57 bpm. Exam Location:  Inpatient Procedure: Limited Echo and Cardiac Doppler Indications:    Stroke 434.91 / I163.9  History:        Patient has no prior history of Echocardiogram examinations.                 Signs/Symptoms:Chest Pain; Risk Factors:Hypertension. Pneumonia                 due to COVID-19 virus.  Sonographer:    Jaquita Folds Referring Phys: [place]  1. Left ventricular ejection fraction, by estimation, is 55 to 60%. The left ventricle has normal function. The left ventrical has no regional wall motion abnormalities. Left ventricular diastolic parameters are indeterminate.  2. Right ventricular systolic function is normal. The right ventricular size is normal. There is normal pulmonary artery systolic pressure.  3. The mitral valve is normal in structure and function. trivial mitral valve regurgitation. No evidence of mitral stenosis.  4. The aortic valve is normal in structure and function. Aortic valve regurgitation is not visualized. No aortic stenosis is present. FINDINGS  Left Ventricle: Left ventricular ejection fraction, by estimation, is 55 to 60%. The left  ventricle has normal function. The left ventricle has no regional wall motion abnormalities. There is no left ventricular hypertrophy. Right Ventricle: The right ventricular size is normal. No increase in right ventricular wall thickness. Right ventricular systolic function is normal. There is normal pulmonary artery systolic pressure. The tricuspid regurgitant velocity is 1.86 m/s, and  with an assumed right atrial pressure of 3 mmHg, the estimated right ventricular systolic pressure is 66.5 mmHg. Mitral Valve: The mitral valve is normal in structure and function. Trivial mitral valve regurgitation. No evidence of mitral valve stenosis. Tricuspid Valve: The tricuspid valve is grossly normal. Tricuspid valve regurgitation is mild. Aortic Valve: The aortic valve is normal in structure and function. Aortic valve regurgitation is not visualized. No aortic stenosis is present. Aorta: The aortic root, ascending aorta and aortic arch are all structurally normal, with no evidence of dilitation or obstruction.  LEFT VENTRICLE PLAX 2D LVIDd:  4.60 cm  Diastology LVIDs:         3.60 cm  LV e' lateral:   8.04 cm/s LV PW:         0.90 cm  LV E/e' lateral: 10.1 LV IVS:        0.90 cm  LV e' medial:    5.91 cm/s LVOT diam:     2.00 cm  LV E/e' medial:  13.8 LV SV:         55.61 ml LV SV Index:   21.50 LVOT Area:     3.14 cm  LEFT ATRIUM             Index LA diam:        4.30 cm 2.20 cm/m LA Vol (A2C):   50.7 ml 25.94 ml/m LA Vol (A4C):   43.8 ml 22.41 ml/m LA Biplane Vol: 48.7 ml 24.92 ml/m  AORTIC VALVE LVOT Vmax:   79.40 cm/s LVOT Vmean:  57.900 cm/s LVOT VTI:    0.177 m  AORTA Ao Root diam: 2.70 cm MITRAL VALVE                        TRICUSPID VALVE MV Area (PHT): 2.50 cm             TR Peak grad:   13.8 mmHg MV Decel Time: 303 msec             TR Vmax:        186.00 cm/s MV E velocity: 81.60 cm/s 103 cm/s MV A velocity: 74.40 cm/s 70.3 cm/s SHUNTS MV E/A ratio:  1.10       1.5       Systemic VTI:  0.18 m                                      Systemic Diam: 2.00 cm Mertie Moores MD Electronically signed by Mertie Moores MD Signature Date/Time: 04/18/2019/5:07:36 PM    Final     Assessment & Plan:   There are no diagnoses linked to this encounter.   No orders of the defined types were placed in this encounter.    Follow-up: No follow-ups on file.  Walker Kehr, MD

## 2019-09-10 NOTE — Assessment & Plan Note (Addendum)
Consider Aricept Head CT -s/p fall a month ago

## 2019-09-10 NOTE — Assessment & Plan Note (Signed)
CBC

## 2019-09-10 NOTE — Assessment & Plan Note (Signed)
New Consider Aricept Head CT -s/p fall a month ago Seroquel at hs RTC 1 wk

## 2019-09-10 NOTE — Assessment & Plan Note (Signed)
Worse 

## 2019-09-10 NOTE — Assessment & Plan Note (Signed)
Diltiazem

## 2019-09-12 DIAGNOSIS — G8114 Spastic hemiplegia affecting left nondominant side: Secondary | ICD-10-CM | POA: Diagnosis not present

## 2019-09-12 DIAGNOSIS — H538 Other visual disturbances: Secondary | ICD-10-CM | POA: Diagnosis not present

## 2019-09-12 DIAGNOSIS — I69398 Other sequelae of cerebral infarction: Secondary | ICD-10-CM | POA: Diagnosis not present

## 2019-09-12 DIAGNOSIS — I69312 Visuospatial deficit and spatial neglect following cerebral infarction: Secondary | ICD-10-CM | POA: Diagnosis not present

## 2019-09-17 DIAGNOSIS — H538 Other visual disturbances: Secondary | ICD-10-CM | POA: Diagnosis not present

## 2019-09-17 DIAGNOSIS — I69312 Visuospatial deficit and spatial neglect following cerebral infarction: Secondary | ICD-10-CM | POA: Diagnosis not present

## 2019-09-17 DIAGNOSIS — G8114 Spastic hemiplegia affecting left nondominant side: Secondary | ICD-10-CM | POA: Diagnosis not present

## 2019-09-17 DIAGNOSIS — I69398 Other sequelae of cerebral infarction: Secondary | ICD-10-CM | POA: Diagnosis not present

## 2019-09-18 ENCOUNTER — Encounter: Payer: Self-pay | Admitting: Internal Medicine

## 2019-09-18 ENCOUNTER — Ambulatory Visit (INDEPENDENT_AMBULATORY_CARE_PROVIDER_SITE_OTHER): Payer: Medicare HMO | Admitting: Internal Medicine

## 2019-09-18 ENCOUNTER — Other Ambulatory Visit: Payer: Self-pay

## 2019-09-18 DIAGNOSIS — R413 Other amnesia: Secondary | ICD-10-CM | POA: Diagnosis not present

## 2019-09-18 DIAGNOSIS — I10 Essential (primary) hypertension: Secondary | ICD-10-CM | POA: Diagnosis not present

## 2019-09-18 DIAGNOSIS — R410 Disorientation, unspecified: Secondary | ICD-10-CM

## 2019-09-18 MED ORDER — QUETIAPINE FUMARATE 25 MG PO TABS: 25.0000 mg | ORAL_TABLET | Freq: Every day | ORAL | 5 refills | Status: DC

## 2019-09-18 NOTE — Patient Instructions (Signed)
[facility] for [employer]

## 2019-09-18 NOTE — Progress Notes (Signed)
Subjective:  Patient ID: Henry [name], male    DOB: 09/23/1940  Age: 79 y.o. MRN: [id number]  CC: No chief complaint on file.   HPI Henry [name] presents for confusion, sleep disorder - better. F/u CVA. F/u HTN  Outpatient Medications Prior to Visit  Medication Sig Dispense Refill  . apixaban (ELIQUIS) 5 MG TABS tablet Take 1 tablet (5 mg total) by mouth 2 (two) times daily. 60 tablet 11  . atorvastatin (LIPITOR) 40 MG tablet Take 0.5 tablets (20 mg total) by mouth daily at 6 PM. 30 tablet 11  . B Complex-Folic Acid (B COMPLEX PLUS) TABS Take 1 tablet by mouth daily. 100 tablet 3  . Cholecalciferol (VITAMIN D3) 50 MCG (2000 UT) capsule Take 1 capsule (2,000 Units total) by mouth daily. 100 capsule 3  . clotrimazole-betamethasone (LOTRISONE) cream Apply 1 application topically 2 (two) times daily. 45 g 1  . diltiazem (CARDIZEM) 60 MG tablet Take 1 tablet (60 mg total) by mouth 2 (two) times daily. 60 tablet 11  . Misc Natural Products (PROSTATE HEALTH) CAPS Take 1 capsule by mouth daily.    . QUEtiapine (SEROQUEL) 25 MG tablet Take 1-2 tablets (25-50 mg total) by mouth at bedtime. 60 tablet 5  . senna-docusate (SENOKOT-S) 8.6-50 MG tablet Take 2 tablets by mouth 2 (two) times daily. 100 tablet 6  . Lactulose 20 GM/30ML SOLN Take 30-60 mLs (20-40 g total) by mouth 2 (two) times daily as needed (severe constipation). (Patient not taking: Reported on 09/18/2019) 450 mL 3  . Probiotic Product (ALIGN PO) Take 1 capsule by mouth daily. (Patient not taking: Reported on 09/18/2019)     No facility-administered medications prior to visit.    ROS: Review of Systems  Constitutional: Negative for appetite change, fatigue and unexpected weight change.  HENT: Negative for congestion, nosebleeds, sneezing, sore throat and trouble swallowing.   Eyes: Negative for itching and visual disturbance.  Respiratory: Negative for cough.   Cardiovascular: Negative for chest pain, palpitations and leg  swelling.  Gastrointestinal: Negative for abdominal distention, blood in stool, diarrhea and nausea.  Genitourinary: Negative for frequency and hematuria.  Musculoskeletal: Positive for gait problem. Negative for back pain, joint swelling and neck pain.  Skin: Negative for rash.  Neurological: Negative for dizziness, tremors, speech difficulty and weakness.  Psychiatric/Behavioral: Negative for agitation, confusion, dysphoric mood and sleep disturbance. The patient is not nervous/anxious.     Objective:  BP 110/72 (BP Location: Right Arm, Patient Position: Sitting, Cuff Size: Normal)   Pulse 66   Temp 98.3 F (36.8 C) (Oral)   Ht 5\' 8"  (1.727 m)   Wt 147 lb (66.7 kg)   SpO2 97%   BMI 22.35 kg/m   BP Readings from Last 3 Encounters:  09/18/19 110/72  09/10/19 122/70  09/05/19 126/82    Wt Readings from Last 3 Encounters:  09/18/19 147 lb (66.7 kg)  09/10/19 149 lb 6.4 oz (67.8 kg)  09/05/19 149 lb (67.6 kg)    Physical Exam Constitutional:      General: He is not in acute distress.    Appearance: He is well-developed.     Comments: NAD  Eyes:     Conjunctiva/sclera: Conjunctivae normal.     Pupils: Pupils are equal, round, and reactive to light.  Neck:     Thyroid: No thyromegaly.     Vascular: No JVD.  Cardiovascular:     Rate and Rhythm: Normal rate and regular rhythm.     Heart  sounds: Normal heart sounds. No murmur heard.  No friction rub. No gallop.   Pulmonary:     Effort: Pulmonary effort is normal. No respiratory distress.     Breath sounds: Normal breath sounds. No wheezing or rales.  Chest:     Chest wall: No tenderness.  Abdominal:     General: Bowel sounds are normal. There is no distension.     Palpations: Abdomen is soft. There is no mass.     Tenderness: There is no abdominal tenderness. There is no guarding or rebound.  Musculoskeletal:        General: No tenderness. Normal range of motion.     Cervical back: Normal range of motion.   Lymphadenopathy:     Cervical: No cervical adenopathy.  Skin:    General: Skin is warm and dry.     Findings: No rash.  Neurological:     Mental Status: He is alert and oriented to person, place, and time.     Cranial Nerves: No cranial nerve deficit.     Motor: No abnormal muscle tone.     Coordination: Coordination abnormal.     Gait: Gait normal.     Deep Tendon Reflexes: Reflexes are normal and symmetric.  Psychiatric:        Behavior: Behavior normal.        Thought Content: Thought content normal.        Judgment: Judgment normal.     Lab Results  Component Value Date   WBC 6.7 09/05/2019   HGB 13.2 09/05/2019   HCT 38.5 (L) 09/05/2019   PLT 189.0 09/05/2019   GLUCOSE 109 (H) 09/05/2019   CHOL 147 08/26/2019   TRIG 180.0 (H) 08/26/2019   HDL 41.80 08/26/2019   LDLDIRECT 135.0 02/24/2017   LDLCALC 70 08/26/2019   ALT 21 09/05/2019   AST 21 09/05/2019   NA 140 09/05/2019   K 3.7 09/05/2019   CL 101 09/05/2019   CREATININE 0.90 09/05/2019   BUN 16 09/05/2019   CO2 32 09/05/2019   TSH 1.24 09/05/2019   PSA 1.75 02/24/2017   INR 1.2 04/17/2019   HGBA1C 6.6 (H) 05/29/2019    CT Head Wo Contrast  Result Date: 09/10/2019 CLINICAL DATA:  Fall 2 weeks ago.  Confusion EXAM: CT HEAD WITHOUT CONTRAST TECHNIQUE: Contiguous axial images were obtained from the base of the skull through the vertex without intravenous contrast. 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 No acute intracranial abnormality. Specifically, no hemorrhage, hydrocephalus, mass lesion, acute infarction, or significant intracranial injury. Vascular: No hyperdense vessel or unexpected calcification. Skull: No acute calvarial abnormality. Sinuses/Orbits: Visualized paranasal sinuses and mastoids clear. Orbital soft tissues unremarkable. 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  No acute intracranial abnormality. Electronically Signed   By: Henry [name] M.D.   On: 09/10/2019  18:27    Assessment & Plan:    Henry Kehr, MD

## 2019-09-18 NOTE — Assessment & Plan Note (Signed)
Resolved

## 2019-09-18 NOTE — Assessment & Plan Note (Signed)
Better  

## 2019-09-18 NOTE — Assessment & Plan Note (Signed)
Drink more water Get up slowly

## 2019-09-19 DIAGNOSIS — G8114 Spastic hemiplegia affecting left nondominant side: Secondary | ICD-10-CM | POA: Diagnosis not present

## 2019-09-19 DIAGNOSIS — H538 Other visual disturbances: Secondary | ICD-10-CM | POA: Diagnosis not present

## 2019-09-19 DIAGNOSIS — I69398 Other sequelae of cerebral infarction: Secondary | ICD-10-CM | POA: Diagnosis not present

## 2019-09-19 DIAGNOSIS — I69312 Visuospatial deficit and spatial neglect following cerebral infarction: Secondary | ICD-10-CM | POA: Diagnosis not present

## 2019-09-24 DIAGNOSIS — H538 Other visual disturbances: Secondary | ICD-10-CM | POA: Diagnosis not present

## 2019-09-24 DIAGNOSIS — I69398 Other sequelae of cerebral infarction: Secondary | ICD-10-CM | POA: Diagnosis not present

## 2019-09-24 DIAGNOSIS — I69312 Visuospatial deficit and spatial neglect following cerebral infarction: Secondary | ICD-10-CM | POA: Diagnosis not present

## 2019-09-24 DIAGNOSIS — G8114 Spastic hemiplegia affecting left nondominant side: Secondary | ICD-10-CM | POA: Diagnosis not present

## 2019-09-26 DIAGNOSIS — H538 Other visual disturbances: Secondary | ICD-10-CM | POA: Diagnosis not present

## 2019-09-26 DIAGNOSIS — I69398 Other sequelae of cerebral infarction: Secondary | ICD-10-CM | POA: Diagnosis not present

## 2019-09-26 DIAGNOSIS — G8114 Spastic hemiplegia affecting left nondominant side: Secondary | ICD-10-CM | POA: Diagnosis not present

## 2019-09-26 DIAGNOSIS — I69312 Visuospatial deficit and spatial neglect following cerebral infarction: Secondary | ICD-10-CM | POA: Diagnosis not present

## 2019-09-30 ENCOUNTER — Other Ambulatory Visit: Payer: Self-pay

## 2019-09-30 ENCOUNTER — Ambulatory Visit (INDEPENDENT_AMBULATORY_CARE_PROVIDER_SITE_OTHER): Payer: Medicare HMO | Admitting: Family

## 2019-09-30 ENCOUNTER — Encounter: Payer: Self-pay | Admitting: Family

## 2019-09-30 VITALS — BP 120/74 | HR 73 | Temp 98.7°F | Wt 149.0 lb

## 2019-09-30 DIAGNOSIS — F22 Delusional disorders: Secondary | ICD-10-CM | POA: Diagnosis not present

## 2019-09-30 DIAGNOSIS — R410 Disorientation, unspecified: Secondary | ICD-10-CM | POA: Diagnosis not present

## 2019-09-30 MED ORDER — DONEPEZIL HCL 5 MG PO TABS
5.0000 mg | ORAL_TABLET | Freq: Every day | ORAL | 1 refills | Status: DC
Start: 2019-09-30 — End: 2020-07-06

## 2019-09-30 NOTE — Progress Notes (Signed)
Henry [name] is a 79 y.o. 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   Acute respiratory failure ([facility])    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   Chest wall pain 07/04/2016   Lumbar stenosis with neurogenic claudication 05/20/2016   BPH (benign prostatic hyperplasia) 12/01/2015   Heel pain 05/13/2015   Inguinal hernia 03/19/2014   Left hip pain 01/10/2014   HTN (hypertension) 01/10/2014   Well adult exam 09/12/2011   Personal history of colonic polyps 06/07/2010   GERD 03/16/2010   Cough 08/11/2009   POLYCYTHEMIA 07/01/2009   Osteoarthritis 04/17/2007   LOW BACK PAIN 04/17/2007   LEG PAIN 04/17/2007   PARESTHESIA 04/17/2007    Current Outpatient Medications  Medication Sig Dispense Refill   apixaban (ELIQUIS) 5 MG TABS tablet Take 1 tablet (5 mg total) by mouth 2 (two) times daily. 60 tablet 11   atorvastatin (LIPITOR) 40 MG tablet Take 0.5 tablets (20 mg total) by mouth daily at 6 PM. 30 tablet 11   B Complex-Folic Acid (B COMPLEX PLUS) TABS Take 1 tablet by mouth daily. 100 tablet 3   Cholecalciferol (VITAMIN D3) 50 MCG (2000 UT) capsule Take 1 capsule (2,000 Units total) by mouth daily. 100 capsule 3   clotrimazole-betamethasone (LOTRISONE) cream Apply 1 application topically 2 (two) times daily. 45 g 1   diltiazem (CARDIZEM) 60 MG tablet Take 1 tablet (60 mg total) by mouth 2 (two) times daily. 60 tablet 11   Misc Natural Products  (PROSTATE HEALTH) CAPS Take 1 capsule by mouth daily.     senna-docusate (SENOKOT-S) 8.6-50 MG tablet Take 2 tablets by mouth 2 (two) times daily. 100 tablet 6   donepezil (ARICEPT) 5 MG tablet Take 1 tablet (5 mg total) by mouth at bedtime. 30 tablet 1   Probiotic Product (ALIGN PO) Take 1 capsule by mouth daily. (Patient not taking: Reported on 09/18/2019)     No current facility-administered medications for this visit.    Allergies: No known allergies and Seroquel [quetiapine]  Past Medical History:  Diagnosis Date   Colon polyps    COVID-19    Elevated MCV 2011   GERD (gastroesophageal reflux disease) 2012   LBP (low back pain)    MRI L spine 06/2007: L L4 stenosis, 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   Osteoarthritis    Polycythemia 2011    Past Surgical History:  Procedure Laterality Date   APPENDECTOMY     COLONOSCOPY     HERNIA REPAIR     march 2019   INGUINAL HERNIA REPAIR Left 2015   IR CT HEAD LTD  04/17/2019   IR PERCUTANEOUS ART THROMBECTOMY/INFUSION INTRACRANIAL INC DIAG ANGIO  04/17/2019   LUMBAR LAMINECTOMY/DECOMPRESSION MICRODISCECTOMY N/A 05/20/2016   Procedure: Lumbar Three-Four,[place] Four-Five,Lumbar Five-Sacral One Laminectomy/Foraminotomy;  Surgeon: Kristeen Miss, MD;  Location: [facility];  Service: Neurosurgery;  Laterality: N/A;   POLYPECTOMY     RADIOLOGY WITH ANESTHESIA N/A 04/17/2019   Procedure: IR WITH ANESTHESIA;  Surgeon: Luanne Bras, MD;  Location: [facility];  Service: Radiology;  Laterality: N/A;    Family History  Problem Relation Age of Onset   Colon cancer Sister 52   Breast cancer Other        1st degree relative   Stroke Neg Hx    Esophageal cancer Neg Hx    Pancreatic cancer Neg Hx    Prostate cancer Neg Hx    Rectal cancer Neg Hx    Stomach cancer Neg Hx     Social History   Tobacco Use   Smoking status: Former Smoker    Packs/day: 1.00    Types: Cigarettes    Quit date: 05/24/1976    Years since quitting: 43.3    Smokeless tobacco: Never Used  Substance Use Topics   Alcohol use: No    Subjective:  Patient is accompanied by his wife; was started on Seroquel to help him sleep approximately 3 weeks ago- per patient and wife, they feel it agitates patient more than be helpful; would like to try different sleep medication;  Of note, patient is scheduled to see his neurologist on 8/9 but would like to try and see his PCP as soon as possible as well.     Objective:  Vitals:   09/30/19 1402  BP: 120/74  Pulse: 73  Temp: 98.7 F (37.1 C)  TempSrc: Oral  SpO2: 95%  Weight: 149 lb 0.3 oz (67.6 kg)    General: Well developed, well nourished, in no acute distress  Skin : Warm and dry.  Head: Normocephalic and atraumatic  Lungs: Respirations unlabored;  Neurologic: Flat affect; speech intact; face symmetrical;   Assessment:  1. Confusion   2. Paranoid ideation ([place])     Plan:  Worsening; Will discontinue the Seroquel- will change to Aricept per Dr. Judeen Hammans recent office note; plan to see him in follow up in 1 week/ keep planned appointment with neurology; Discussed benefit from home caregiver for both patient and patient's wife- they are looking into options;  This visit occurred during the SARS-CoV-2 public health emergency.  Safety protocols were in place, including screening questions prior to the visit, additional usage of staff PPE, and extensive cleaning of exam room while observing appropriate contact time as indicated for disinfecting solutions.     Return in about 1 week (around 10/07/2019) for with Dr. Alain Marion.  No orders of the defined types were placed in this encounter.   Requested Prescriptions   Signed Prescriptions Disp Refills   donepezil (ARICEPT) 5 MG tablet 30 tablet 1    Sig: Take 1 tablet (5 mg total) by mouth at bedtime.

## 2019-10-01 DIAGNOSIS — I69398 Other sequelae of cerebral infarction: Secondary | ICD-10-CM | POA: Diagnosis not present

## 2019-10-01 DIAGNOSIS — I69312 Visuospatial deficit and spatial neglect following cerebral infarction: Secondary | ICD-10-CM | POA: Diagnosis not present

## 2019-10-01 DIAGNOSIS — H538 Other visual disturbances: Secondary | ICD-10-CM | POA: Diagnosis not present

## 2019-10-01 DIAGNOSIS — G8114 Spastic hemiplegia affecting left nondominant side: Secondary | ICD-10-CM | POA: Diagnosis not present

## 2019-10-03 DIAGNOSIS — H538 Other visual disturbances: Secondary | ICD-10-CM | POA: Diagnosis not present

## 2019-10-03 DIAGNOSIS — I69312 Visuospatial deficit and spatial neglect following cerebral infarction: Secondary | ICD-10-CM | POA: Diagnosis not present

## 2019-10-03 DIAGNOSIS — G8114 Spastic hemiplegia affecting left nondominant side: Secondary | ICD-10-CM | POA: Diagnosis not present

## 2019-10-03 DIAGNOSIS — I69398 Other sequelae of cerebral infarction: Secondary | ICD-10-CM | POA: Diagnosis not present

## 2019-10-07 ENCOUNTER — Ambulatory Visit (INDEPENDENT_AMBULATORY_CARE_PROVIDER_SITE_OTHER): Payer: Medicare HMO | Admitting: Internal Medicine

## 2019-10-07 ENCOUNTER — Encounter: Payer: Self-pay | Admitting: Internal Medicine

## 2019-10-07 ENCOUNTER — Other Ambulatory Visit: Payer: Self-pay

## 2019-10-07 DIAGNOSIS — R410 Disorientation, unspecified: Secondary | ICD-10-CM

## 2019-10-07 DIAGNOSIS — F22 Delusional disorders: Secondary | ICD-10-CM | POA: Diagnosis not present

## 2019-10-07 DIAGNOSIS — I69354 Hemiplegia and hemiparesis following cerebral infarction affecting left non-dominant side: Secondary | ICD-10-CM | POA: Diagnosis not present

## 2019-10-07 DIAGNOSIS — R413 Other amnesia: Secondary | ICD-10-CM

## 2019-10-07 MED ORDER — QUETIAPINE FUMARATE 25 MG PO TABS: ORAL_TABLET | ORAL | 3 refills | Status: DC

## 2019-10-07 NOTE — Assessment & Plan Note (Signed)
Better on Seroquel 

## 2019-10-07 NOTE — Assessment & Plan Note (Addendum)
Neurology appt next week

## 2019-10-07 NOTE — Assessment & Plan Note (Signed)
Better on Seroquel - see Rx dose change

## 2019-10-07 NOTE — Progress Notes (Signed)
Subjective:  Patient ID: Henry [name], male    DOB: 02/20/1941  Age: 79 y.o. MRN: [id number]  CC: No chief complaint on file.   HPI Henry [name] presents for side effects w/Seroquel - agitation - stopped, then re-started - it is working now   Outpatient Medications Prior to Visit  Medication Sig Dispense Refill  . apixaban (ELIQUIS) 5 MG TABS tablet Take 1 tablet (5 mg total) by mouth 2 (two) times daily. 60 tablet 11  . atorvastatin (LIPITOR) 40 MG tablet Take 0.5 tablets (20 mg total) by mouth daily at 6 PM. 30 tablet 11  . B Complex-Folic Acid (B COMPLEX PLUS) TABS Take 1 tablet by mouth daily. 100 tablet 3  . Cholecalciferol (VITAMIN D3) 50 MCG (2000 UT) capsule Take 1 capsule (2,000 Units total) by mouth daily. 100 capsule 3  . clotrimazole-betamethasone (LOTRISONE) cream Apply 1 application topically 2 (two) times daily. 45 g 1  . diltiazem (CARDIZEM) 60 MG tablet Take 1 tablet (60 mg total) by mouth 2 (two) times daily. 60 tablet 11  . Misc Natural Products (PROSTATE HEALTH) CAPS Take 1 capsule by mouth daily.    . Probiotic Product (ALIGN PO) Take 1 capsule by mouth daily.     . QUEtiapine Fumarate (SEROQUEL PO) Take by mouth as needed.    . senna-docusate (SENOKOT-S) 8.6-50 MG tablet Take 2 tablets by mouth 2 (two) times daily. 100 tablet 6  . donepezil (ARICEPT) 5 MG tablet Take 1 tablet (5 mg total) by mouth at bedtime. (Patient not taking: Reported on 10/07/2019) 30 tablet 1   No facility-administered medications prior to visit.    ROS: Review of Systems  Constitutional: Negative for appetite change, fatigue and unexpected weight change.  HENT: Negative for congestion, nosebleeds, sneezing, sore throat and trouble swallowing.   Eyes: Negative for itching and visual disturbance.  Respiratory: Negative for cough.   Cardiovascular: Negative for chest pain, palpitations and leg swelling.  Gastrointestinal: Negative for abdominal distention, blood in stool, diarrhea and  nausea.  Genitourinary: Negative for frequency and hematuria.  Musculoskeletal: Positive for gait problem. Negative for back pain, joint swelling and neck pain.  Skin: Negative for rash.  Neurological: Positive for weakness. Negative for dizziness, tremors and speech difficulty.  Psychiatric/Behavioral: Positive for confusion and decreased concentration. Negative for agitation, behavioral problems, dysphoric mood, sleep disturbance and suicidal ideas. The patient is nervous/anxious.   not homicidal  Objective:  BP (!) 96/62 (BP Location: Right Arm, Patient Position: Sitting, Cuff Size: Normal)   Pulse 70   Temp 98.2 F (36.8 C) (Oral)   Ht 5\' 8"  (1.727 m)   Wt 147 lb (66.7 kg)   SpO2 98%   BMI 22.35 kg/m   BP Readings from Last 3 Encounters:  10/07/19 (!) 96/62  09/30/19 120/74  09/18/19 110/72    Wt Readings from Last 3 Encounters:  10/07/19 147 lb (66.7 kg)  09/30/19 149 lb 0.3 oz (67.6 kg)  09/18/19 147 lb (66.7 kg)    Physical Exam Constitutional:      General: He is not in acute distress.    Appearance: He is well-developed.     Comments: NAD  Eyes:     Conjunctiva/sclera: Conjunctivae normal.     Pupils: Pupils are equal, round, and reactive to light.  Neck:     Thyroid: No thyromegaly.     Vascular: No JVD.  Cardiovascular:     Rate and Rhythm: Normal rate and regular rhythm.  Heart sounds: Normal heart sounds. No murmur heard.  No friction rub. No gallop.   Pulmonary:     Effort: Pulmonary effort is normal. No respiratory distress.     Breath sounds: Normal breath sounds. No wheezing or rales.  Chest:     Chest wall: No tenderness.  Abdominal:     General: Bowel sounds are normal. There is no distension.     Palpations: Abdomen is soft. There is no mass.     Tenderness: There is no abdominal tenderness. There is no guarding or rebound.  Musculoskeletal:        General: No tenderness. Normal range of motion.     Cervical back: Normal range of motion.   Lymphadenopathy:     Cervical: No cervical adenopathy.  Skin:    General: Skin is warm and dry.     Findings: No rash.  Neurological:     Mental Status: He is alert and oriented to person, place, and time.     Cranial Nerves: No cranial nerve deficit.     Motor: Weakness present. No abnormal muscle tone.     Coordination: Coordination abnormal.     Gait: Gait abnormal.     Deep Tendon Reflexes: Reflexes are normal and symmetric.  Psychiatric:        Behavior: Behavior normal.        Thought Content: Thought content normal.   hemiparesis - better  Lab Results  Component Value Date   WBC 6.7 09/05/2019   HGB 13.2 09/05/2019   HCT 38.5 (L) 09/05/2019   PLT 189.0 09/05/2019   GLUCOSE 109 (H) 09/05/2019   CHOL 147 08/26/2019   TRIG 180.0 (H) 08/26/2019   HDL 41.80 08/26/2019   LDLDIRECT 135.0 02/24/2017   LDLCALC 70 08/26/2019   ALT 21 09/05/2019   AST 21 09/05/2019   NA 140 09/05/2019   K 3.7 09/05/2019   CL 101 09/05/2019   CREATININE 0.90 09/05/2019   BUN 16 09/05/2019   CO2 32 09/05/2019   TSH 1.24 09/05/2019   PSA 1.75 02/24/2017   INR 1.2 04/17/2019   HGBA1C 6.6 (H) 05/29/2019    CT Head Wo Contrast  Result Date: 09/10/2019 CLINICAL DATA:  Fall 2 weeks ago.  Confusion EXAM: CT HEAD WITHOUT CONTRAST TECHNIQUE: Contiguous axial images were obtained from the base of the skull through the vertex without intravenous contrast. 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 No acute intracranial abnormality. Specifically, no hemorrhage, hydrocephalus, mass lesion, acute infarction, or significant intracranial injury. Vascular: No hyperdense vessel or unexpected calcification. Skull: No acute calvarial abnormality. Sinuses/Orbits: Visualized paranasal sinuses and mastoids clear. Orbital soft tissues unremarkable. 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  No acute intracranial abnormality. Electronically Signed   By: Rolm Baptise M.D.   On:  09/10/2019 18:27    Assessment & Plan:   There are no diagnoses linked to this encounter.   No orders of the defined types were placed in this encounter.    Follow-up: No follow-ups on file.  Walker Kehr, MD

## 2019-10-07 NOTE — Assessment & Plan Note (Signed)
[employer] [employer] 

## 2019-10-08 DIAGNOSIS — R2689 Other abnormalities of gait and mobility: Secondary | ICD-10-CM | POA: Diagnosis not present

## 2019-10-08 DIAGNOSIS — R2681 Unsteadiness on feet: Secondary | ICD-10-CM | POA: Diagnosis not present

## 2019-10-08 DIAGNOSIS — H538 Other visual disturbances: Secondary | ICD-10-CM | POA: Diagnosis not present

## 2019-10-08 DIAGNOSIS — I69398 Other sequelae of cerebral infarction: Secondary | ICD-10-CM | POA: Diagnosis not present

## 2019-10-08 DIAGNOSIS — I69312 Visuospatial deficit and spatial neglect following cerebral infarction: Secondary | ICD-10-CM | POA: Diagnosis not present

## 2019-10-08 DIAGNOSIS — R278 Other lack of coordination: Secondary | ICD-10-CM | POA: Diagnosis not present

## 2019-10-08 DIAGNOSIS — I69354 Hemiplegia and hemiparesis following cerebral infarction affecting left non-dominant side: Secondary | ICD-10-CM | POA: Diagnosis not present

## 2019-10-10 ENCOUNTER — Ambulatory Visit: Payer: Medicare HMO | Admitting: Internal Medicine

## 2019-10-10 DIAGNOSIS — R2689 Other abnormalities of gait and mobility: Secondary | ICD-10-CM | POA: Diagnosis not present

## 2019-10-10 DIAGNOSIS — I69354 Hemiplegia and hemiparesis following cerebral infarction affecting left non-dominant side: Secondary | ICD-10-CM | POA: Diagnosis not present

## 2019-10-10 DIAGNOSIS — I69398 Other sequelae of cerebral infarction: Secondary | ICD-10-CM | POA: Diagnosis not present

## 2019-10-10 DIAGNOSIS — R278 Other lack of coordination: Secondary | ICD-10-CM | POA: Diagnosis not present

## 2019-10-10 DIAGNOSIS — I69312 Visuospatial deficit and spatial neglect following cerebral infarction: Secondary | ICD-10-CM | POA: Diagnosis not present

## 2019-10-10 DIAGNOSIS — H538 Other visual disturbances: Secondary | ICD-10-CM | POA: Diagnosis not present

## 2019-10-10 DIAGNOSIS — R2681 Unsteadiness on feet: Secondary | ICD-10-CM | POA: Diagnosis not present

## 2019-10-14 ENCOUNTER — Encounter: Payer: Self-pay | Admitting: Neurology

## 2019-10-14 ENCOUNTER — Other Ambulatory Visit: Payer: Self-pay

## 2019-10-14 ENCOUNTER — Ambulatory Visit: Payer: Medicare HMO | Admitting: Neurology

## 2019-10-14 VITALS — BP 123/76 | HR 65 | Ht 68.0 in | Wt 148.2 lb

## 2019-10-14 DIAGNOSIS — I82462 Acute embolism and thrombosis of left calf muscular vein: Secondary | ICD-10-CM | POA: Diagnosis not present

## 2019-10-14 DIAGNOSIS — I6990 Unspecified sequelae of unspecified cerebrovascular disease: Secondary | ICD-10-CM | POA: Diagnosis not present

## 2019-10-14 MED ORDER — MEMANTINE HCL 28 X 5 MG & 21 X 10 MG PO TABS: ORAL_TABLET | ORAL | 12 refills | Status: DC

## 2019-10-14 NOTE — Patient Instructions (Addendum)
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  Recommend start Namenda starter pack increase as tolerated.  Continue Seroquel which seems to be helping him.  Check carotid ultrasound and lower extremity venous Dopplers and if his DVT is resolved may consider switching Eliquis to aspirin.  He will return for follow-up in the future in 3 months with my nurse practitioner Janett Billow or call earlier if necessary.   Vascular Dementia Dementia is a condition in which a person has problems with thinking, memory, and behavior that are severe enough to interfere with daily life. Vascular dementia is a type of dementia. It results from brain damage that is caused by the brain not getting enough blood. This condition may also be called vascular cognitive impairment. What are the causes? Vascular dementia is caused by conditions that lessen blood flow to the brain. Common causes of this condition include:  Multiple small strokes. These may happen without symptoms (silent stroke).  Major stroke.  Damage to small blood vessels in the brain (cerebral small vessel disease). What increases the risk? The following factors may make you more likely to develop this condition:  Having had a stroke.  Having high blood pressure (hypertension) or high cholesterol.  Having a disease that affects the heart or blood vessels.  Smoking.  Having diabetes.  Having metabolic syndrome.  Being obese.  Not being active.  Having depression.  Being over age 46. What are the signs or symptoms? Symptoms can vary from one person to another. Symptoms may be mild or severe depending on the amount of damage and which parts of the brain have been affected. Symptoms may begin suddenly or may develop slowly. Mental symptoms  of vascular dementia may include:  Confusion.  Memory problems.  Poor attention and concentration.  Trouble understanding speech.  Depression.  Personality changes.  Trouble recognizing familiar people.  Agitation or aggression.  Paranoia.  Delusions or hallucinations. Physical symptoms of vascular dementia may include:  Weakness.  Poor balance.  Loss of bladder or bowel control (incontinence).  Unsteady walking (gait).  Speaking problems. Behavioral symptoms of vascular dementia may include:  Getting lost in familiar places.  Problems with planning and judgment.  Trouble following instructions.  Social problems.  Emotional outbursts.  Trouble with daily activities and self-care.  Problems handling money. Symptoms may remain stable, or they may get worse over time. Symptoms of vascular dementia may be similar to those of Alzheimer's disease. The two conditions can occur together (mixed dementia). How is this diagnosed? Your health care provider will consider your medical history and symptoms or changes that are reported by friends and family. Your health care provider will do a physical exam and may order lab tests or other tests that check brain and nervous system function. Tests that may be done include:  Blood tests.  Brain imaging tests.  Tests of movement, speech, and other daily activities (neurological exam).  Tests of memory, thinking, and problem-solving (neuropsychological or neurocognitive testing). There is not a specific test to diagnose vascular dementia. Diagnosis may involve several specialists. These may include:  A health care provider who specializes in the brain and nervous system (neurologist).  A health provider who specializes in understanding how problems in the brain can alter behavior and cognitive function (neuropsychologist). How is this  treated? There is no cure for vascular dementia. Brain damage that has already occurred  cannot be reversed. Treatment depends on:  How severe the condition is.  Which parts of your brain have been affected.  Your overall health. Treatment measures aim to:  Treat the underlying cause of vascular dementia and manage risk factors. This may include: ? Controlling blood pressure. ? Lowering cholesterol. ? Treating diabetes. ? Quitting smoking. ? Losing weight or maintaining a healthy weight. ? Eating a healthy, balanced diet. ? Getting regular exercise.  Manage symptoms.  Prevent further brain damage.  Improve the person's health and quality of life. Treatment for dementia may involve a team of health care providers, including:  A neurologist.  A provider who specializes in disorders of the mind (psychiatrist).  A provider who specializes in helping people learn daily living skills (occupational therapist).  A provider who focuses on speech and language changes (Electrical engineer).  A heart specialist (cardiologist).  A provider who helps people learn how to manage physical changes, such as movement and walking (exercise physiologist or physical therapist). Follow these instructions at home: Lifestyle  People with vascular dementia may need regular help at home or daily care from a family member or home health care worker. Home care for a person with vascular dementia depends on what caused the condition and how severe the symptoms are. General guidelines for caregivers include:  Help the person with dementia remember people, appointments, and daily activities.  Help the person with dementia manage his or her medicines.  Help family and friends learn about ways to communicate with the person with dementia.  Create a safe living space to reduce the risk of injury or falls.  Find a support group to help caregivers and family cope with the effects of dementia.  General instructions  Help the person take over-the-counter and prescription medicines only as  told by the health care provider.  Follow the health care provider's instructions for treating the condition that caused the dementia.  Make sure the person keeps all follow-up visits as told by the health care provider. This is important. Contact a health care provider if:  A fever develops.  New behavioral problems develop.  Problems with swallowing develop.  Confusion gets worse.  Sleepiness gets worse. Get help right away if:  Loss of consciousness occurs.  There is a sudden loss of speech, balance, or thinking ability.  New numbness or paralysis occurs.  Sudden, severe headache occurs.  Vision is lost or suddenly gets worse in one or both eyes. Summary  Vascular dementia is a type of dementia. It results from brain damage that is caused by the brain not getting enough blood.  Vascular dementia is caused by conditions that lessen blood flow to the brain. Common causes of this condition include stroke and damage to small blood vessels in the brain.  Treatment focuses on treating the underlying cause of vascular dementia and managing any risk factors.  People with vascular dementia may need regular help at home or daily care from a family member or home health care worker.  Contact a health care provider if you or your caregiver notice any new symptoms. This information is not intended to replace advice given to you by your health care provider. Make sure you discuss any questions you have with your health care provider. Document Revised: 11/22/2017 Document Reviewed: 11/23/2017 Elsevier Patient Education  [place].

## 2019-10-14 NOTE — Progress Notes (Signed)
[facility] [address] [place]. [place] [address] [phone]       OFFICE FOLLOW UPVISIT NOTE  Mr. Henry [name] Date of Birth:  [birth year] Medical Record Number:  [id number]   Referring MD: Rosalin Hawking  Reason for Referral: Stroke HPI: Initial visit 08/20/2019 :Henry [name] is a 79 year old Caucasian male seen today for initial office consultation visit.  History is obtained from the patient, review of electronic medical records and have personally reviewed imaging films in PACS.  Patient has past medical history of hypertension, polycythemia, low back pain and recent Covid infection who presented to [facility] on 04/17/2019 as a code stroke for sudden onset of left-sided weakness and neglect.  On arrival he had NIH stroke scale of 16.  CT scan of the head showed early acute ischemic changes involving the right MCA territory and is aspect score was 4.  CT perfusion however showed only 10 mm core infarct with 147 cc of penumbra with a mismatch of 137 mL.  Due to this mismatch patient was taken for emergent MRI scan which showed a large area of restricted diffusion however with corresponding T2 flair mismatch hence after discussion with patient and daughter as well as interventional neuroradiologist it was decided to take the patient for mechanical thrombectomy which was performed successfully with TICI 2 c reveasularization by Dr. Estanislado Pandy.  He was kept in the intensive care unit for blood pressure was tightly controlled.  Follow-up scan showed large right MCA infarct with some petechial hemorrhage with local mass-effect.  2D echo showed ejection fraction 55 to 60% without cardiac source of embolism.  Lower extremity venous Doppler showed DVT in the left gastrocnemius vein.  Transcranial Doppler study was negative for PFO.  LDL cholesterol was 53 mg percent hemoglobin A1c was 6.8.  Patient was transferred to skilled nursing facility for rehab and started on Eliquis for  his DVT.  Patient states she is done well he has finished rehabilitation and skilled nursing facility and is presently living at home.  He is finished home physical and occupational therapy and plans to start outpatient therapy this week.  He is able to ambulate with a cane he states his balance is good is an only one minor fall.  He is on Eliquis which is tolerating well without bleeding or bruising.  His blood pressures well controlled today it is 126/74.  He is tolerating Lipitor well without muscle aches and pains but he states he is constipated and feels this may be related to Lipitor. Update 10/14/2019 : He returns for follow-up after last visit 2 months ago.  Is accompanied by his wife.  Wife is concerned about patient's behavior which has changed.  Is having some bad thoughts and states times feels like getting himself.  He is also become paranoid and feels that wife is leaving him.  He has seen his primary care physician who recently started him on Seroquel and is currently taking 12 point 5 in the morning and 50 at night which seems to be be helping as is sleeping better.  He still have intermittent agitation, verbal abuse no physical threats as well as confusion initially responded well to Seroquel but it was stopped but seems to be doing better with recent dose change.  He was also started on Aricept l and is taking 5 daily which she seems to be tolerating well but does not seem to have helped him much.  LDL cholesterol in 08/26/2019 was 70 mg percent but  triglycerides were elevated at 180.  Lab work on 09/05/2019 showed vitamin B12 TSH and vitamin D to be normal.  CT scan of the head was done on 09/10/2019 due to a fall which showed no acute abnormalities.  He is currently doing outpatient physical occupational therapy and is ambulating well.  Walks with a cane.  He has had no recurrent stroke or TIA symptoms.  He remains on Eliquis which is tolerating well without bruising or bleeding.  At last visit had  ordered an outpatient carotid ultrasound which for unclear reason has not yet been done. ROS:   14 system review of systems is positive for weakness, gait difficulty, vision loss, bruising and all other systems negative  PMH:  Past Medical History:  Diagnosis Date  . Colon polyps   . COVID-19   . Elevated MCV 2011  . GERD (gastroesophageal reflux disease) 2012  . 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 Osteoarthritis   . Polycythemia 2011    Social History:  Social History   Socioeconomic History  . Marital status: Married    Spouse name: [name]  . Number of children: 3  . Years of education: 101  . Highest education level: Not on file  Occupational History  . Occupation: Retired    Fish farm manager: RETIRED  Tobacco Use  . Smoking status: Former Smoker    Packs/day: 1.00    Types: Cigarettes    Quit date: 05/24/1976    Years since quitting: 43.4  . Smokeless tobacco: Never Used  Vaping Use  . Vaping Use: Never used  Substance and Sexual Activity  . Alcohol use: No  . Drug use: No  . Sexual activity: Not Currently  Other Topics Concern  . Not on file  Social History Narrative  . Not on file   Social Determinants of Health   Financial Resource Strain:   . Difficulty of Paying Living Expenses:   Food Insecurity:   . Worried About Charity fundraiser in the Last Year:   . Arboriculturist in the Last Year:   Transportation Needs:   . Film/video editor (Medical):   [name] Lack of Transportation (Non-Medical):   Physical Activity:   . Days of Exercise per Week:   . Minutes of Exercise per Session:   Stress:   . Feeling of Stress :   Social Connections:   . Frequency of Communication with Friends and Family:   . Frequency of Social Gatherings with Friends and Family:   . Attends Religious Services:   . Active Member of Clubs or Organizations:   . Attends Archivist Meetings:   [name] Marital Status:   Intimate  Partner Violence:   . Fear of Current or Ex-Partner:   . Emotionally Abused:   [name] Physically Abused:   . Sexually Abused:     Medications:   Current Outpatient Medications on File Prior to Visit  Medication Sig Dispense Refill  . apixaban (ELIQUIS) 5 MG TABS tablet Take 1 tablet (5 mg total) by mouth 2 (two) times daily. 60 tablet 11  . atorvastatin (LIPITOR) 40 MG tablet Take 0.5 tablets (20 mg total) by mouth daily at 6 PM. 30 tablet 11  . B Complex-Folic Acid (B COMPLEX PLUS) TABS Take 1 tablet by mouth daily. 100 tablet 3  . Cholecalciferol (VITAMIN D3) 50 MCG (2000 UT) capsule Take 1 capsule (2,000 Units total) by mouth daily. 100  capsule 3  . diltiazem (CARDIZEM) 60 MG tablet Take 1 tablet (60 mg total) by mouth 2 (two) times daily. 60 tablet 11  . Misc Natural Products (PROSTATE HEALTH) CAPS Take 1 capsule by mouth daily.    . Probiotic Product (ALIGN PO) Take 1 capsule by mouth daily.     . QUEtiapine (SEROQUEL) 25 MG tablet Take 1/2 tablet in am and 2 tablets at night 90 tablet 3  . senna-docusate (SENOKOT-S) 8.6-50 MG tablet Take 2 tablets by mouth 2 (two) times daily. 100 tablet 6  . clotrimazole-betamethasone (LOTRISONE) cream Apply 1 application topically 2 (two) times daily. (Patient not taking: Reported on 10/14/2019) 45 g 1  . donepezil (ARICEPT) 5 MG tablet Take 1 tablet (5 mg total) by mouth at bedtime. (Patient not taking: Reported on 10/07/2019) 30 tablet 1   No current facility-administered medications on file prior to visit.    Allergies:   Allergies  Allergen Reactions  . No Known Allergies     Physical Exam General: well developed, well nourished elderly Caucasian male, seated, in no evident distress Head: head normocephalic and atraumatic.   Neck: supple with no carotid or supraclavicular bruits Cardiovascular: regular rate and rhythm, soft ejection murmur. Musculoskeletal: no deformity Skin:  no rash/petichiae Vascular:  Normal pulses all  extremities  Neurologic Exam Mental Status: Awake and fully alert. Oriented to place and time. Recent and remote memory intact. Attention span, concentration and fund of knowledge appropriate. Mood and affect appropriate.  Mini-Mental status exam score 20/30 with deficits in attention, calculation and recall.  Able to name 11 animals that can walk on 4 legs.  Clock drawing 3/4.  Unable to copy intersecting pentagons.  On geriatric depression scale scored 9 which is borderline for depression Cranial Nerves: Fundoscopic exam reveals sharp disc margins. Pupils equal, briskly reactive to light. Extraocular movements full without nystagmus. 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 Hearing diminished bilaterally. Facial sensation intact.  Mild left lower facial asymmetry., tongue, palate moves normally and symmetrically.  Motor: Spastic left hemiparesis with grade 3/5 strength at left shoulder and elbow with significant weakness of left grip and fingers in intrinsic hand muscles but is able to bend fingers and make a weak grip..  Mild left lower extremities weakness in the hip flexors and ankle dorsiflexors.  Left upper and lower extremity drift. Sensory.: intact to touch , pinprick , position and vibratory sensation.  Coordination: Rapid alternating movements normal in all extremities. Finger-to-nose and heel-to-shin performed accurately bilaterally. Gait and Station: Arises from chair without difficulty. Stance is normal. Gait demonstrates mild stiffness and circumduction of the left leg with mild foot drop.   Reflexes: 1+ and asymmetric and brisker on the left. Toes downgoing.   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  Patient also has DVT in the left leg and is  on anticoagulation with Eliquis. 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  Recommend start Namenda starter pack increase as tolerated.  Continue Seroquel which seems to be helping him.  Check carotid ultrasound and lower extremity venous Dopplers and if his DVT is resolved may consider switching Eliquis to aspirin.  He will return for follow-up in the future in 3 months with my nurse practitioner Janett Billow or call earlier if necessary.  Greater than 50% time during this 30-minute  was it was spent on counseling and coordination of care about his stroke and DVT and planning treatment and answering questions.   Antony Contras, MD  [facility] [address] [place], [place] [address]  Phone [phone] Fax [phone] Note: This document was prepared with digital dictation and possible smart phrase technology. Any transcriptional errors that result from this process are unintentional.

## 2019-10-15 ENCOUNTER — Telehealth: Payer: Self-pay | Admitting: Neurology

## 2019-10-15 DIAGNOSIS — R2689 Other abnormalities of gait and mobility: Secondary | ICD-10-CM | POA: Diagnosis not present

## 2019-10-15 DIAGNOSIS — I69398 Other sequelae of cerebral infarction: Secondary | ICD-10-CM | POA: Diagnosis not present

## 2019-10-15 DIAGNOSIS — I69312 Visuospatial deficit and spatial neglect following cerebral infarction: Secondary | ICD-10-CM | POA: Diagnosis not present

## 2019-10-15 DIAGNOSIS — I69354 Hemiplegia and hemiparesis following cerebral infarction affecting left non-dominant side: Secondary | ICD-10-CM | POA: Diagnosis not present

## 2019-10-15 DIAGNOSIS — R278 Other lack of coordination: Secondary | ICD-10-CM | POA: Diagnosis not present

## 2019-10-15 DIAGNOSIS — R2681 Unsteadiness on feet: Secondary | ICD-10-CM | POA: Diagnosis not present

## 2019-10-15 DIAGNOSIS — H538 Other visual disturbances: Secondary | ICD-10-CM | POA: Diagnosis not present

## 2019-10-15 NOTE — Telephone Encounter (Signed)
Pt's wife called stating that the pt's memantine (NAMENDA TITRATION PAK) tablet pack prescription has not been received by [facility] and she is wanting to know when this will be done so that she can go pick it up. Please advise.

## 2019-10-15 NOTE — Telephone Encounter (Signed)
Called [facility], spoke with Gershon Mussel, pharmacist who stated they do not order titration packs due to cost of > $200 to patient. He can fill Rx exactly using tablets they have on hand and give same titration instructions. I advised that is fine. Gershon Mussel stated he will get Rx ready and call patient's wife. Pharmacist verbalized understanding, appreciation.

## 2019-10-17 ENCOUNTER — Other Ambulatory Visit: Payer: Self-pay

## 2019-10-17 ENCOUNTER — Ambulatory Visit (HOSPITAL_COMMUNITY)
Admission: RE | Admit: 2019-10-17 | Discharge: 2019-10-17 | Disposition: A | Discharge status: Home / Self Care | Payer: Medicare HMO | Source: Ambulatory Visit | Attending: Neurology | Admitting: Neurology

## 2019-10-17 ENCOUNTER — Ambulatory Visit (HOSPITAL_BASED_OUTPATIENT_CLINIC_OR_DEPARTMENT_OTHER)
Admission: RE | Admit: 2019-10-17 | Discharge: 2019-10-17 | Disposition: A | Discharge status: Home / Self Care | Payer: Medicare HMO | Source: Ambulatory Visit | Attending: Neurology | Admitting: Neurology

## 2019-10-17 DIAGNOSIS — I6990 Unspecified sequelae of unspecified cerebrovascular disease: Secondary | ICD-10-CM | POA: Diagnosis not present

## 2019-10-17 DIAGNOSIS — I69312 Visuospatial deficit and spatial neglect following cerebral infarction: Secondary | ICD-10-CM | POA: Diagnosis not present

## 2019-10-17 DIAGNOSIS — I82462 Acute embolism and thrombosis of left calf muscular vein: Secondary | ICD-10-CM | POA: Diagnosis not present

## 2019-10-17 DIAGNOSIS — R278 Other lack of coordination: Secondary | ICD-10-CM | POA: Diagnosis not present

## 2019-10-17 DIAGNOSIS — H538 Other visual disturbances: Secondary | ICD-10-CM | POA: Diagnosis not present

## 2019-10-17 DIAGNOSIS — R2681 Unsteadiness on feet: Secondary | ICD-10-CM | POA: Diagnosis not present

## 2019-10-17 DIAGNOSIS — I69354 Hemiplegia and hemiparesis following cerebral infarction affecting left non-dominant side: Secondary | ICD-10-CM | POA: Diagnosis not present

## 2019-10-17 DIAGNOSIS — I69398 Other sequelae of cerebral infarction: Secondary | ICD-10-CM | POA: Diagnosis not present

## 2019-10-17 DIAGNOSIS — R2689 Other abnormalities of gait and mobility: Secondary | ICD-10-CM | POA: Diagnosis not present

## 2019-10-17 NOTE — Progress Notes (Signed)
VASCULAR LAB    Bilateral lower extremity venous duplex completed.    Preliminary report:  See CV proc for preliminary results.  Haadiya Frogge, RVT 10/17/2019, 1:41 PM

## 2019-10-17 NOTE — Progress Notes (Signed)
VASCULAR LAB    Carotid duplex completed.    Preliminary report:  See CV proc for preliminary results.  Zareah Hunzeker, RVT 10/17/2019, 1:48 PM

## 2019-10-22 DIAGNOSIS — I69312 Visuospatial deficit and spatial neglect following cerebral infarction: Secondary | ICD-10-CM | POA: Diagnosis not present

## 2019-10-22 DIAGNOSIS — R278 Other lack of coordination: Secondary | ICD-10-CM | POA: Diagnosis not present

## 2019-10-22 DIAGNOSIS — I69354 Hemiplegia and hemiparesis following cerebral infarction affecting left non-dominant side: Secondary | ICD-10-CM | POA: Diagnosis not present

## 2019-10-22 DIAGNOSIS — R2681 Unsteadiness on feet: Secondary | ICD-10-CM | POA: Diagnosis not present

## 2019-10-22 DIAGNOSIS — I69398 Other sequelae of cerebral infarction: Secondary | ICD-10-CM | POA: Diagnosis not present

## 2019-10-22 DIAGNOSIS — R2689 Other abnormalities of gait and mobility: Secondary | ICD-10-CM | POA: Diagnosis not present

## 2019-10-22 DIAGNOSIS — H538 Other visual disturbances: Secondary | ICD-10-CM | POA: Diagnosis not present

## 2019-10-24 DIAGNOSIS — I69398 Other sequelae of cerebral infarction: Secondary | ICD-10-CM | POA: Diagnosis not present

## 2019-10-24 DIAGNOSIS — I69312 Visuospatial deficit and spatial neglect following cerebral infarction: Secondary | ICD-10-CM | POA: Diagnosis not present

## 2019-10-24 DIAGNOSIS — H538 Other visual disturbances: Secondary | ICD-10-CM | POA: Diagnosis not present

## 2019-10-24 DIAGNOSIS — R278 Other lack of coordination: Secondary | ICD-10-CM | POA: Diagnosis not present

## 2019-10-24 DIAGNOSIS — R2689 Other abnormalities of gait and mobility: Secondary | ICD-10-CM | POA: Diagnosis not present

## 2019-10-24 DIAGNOSIS — R2681 Unsteadiness on feet: Secondary | ICD-10-CM | POA: Diagnosis not present

## 2019-10-24 DIAGNOSIS — I69354 Hemiplegia and hemiparesis following cerebral infarction affecting left non-dominant side: Secondary | ICD-10-CM | POA: Diagnosis not present

## 2019-10-25 NOTE — Progress Notes (Signed)
Kindly inform the patient that carotid ultrasound study showed no significant narrowing of either carotid arteries in the neck on both sides

## 2019-10-25 NOTE — Progress Notes (Signed)
Kindly inform the patient that follow-up lower extremity venous Doppler study shows resolution of the previously seen blood clot in the leg.  Nothing to worry about

## 2019-10-28 ENCOUNTER — Encounter: Payer: Self-pay | Admitting: *Deleted

## 2019-10-29 DIAGNOSIS — I69354 Hemiplegia and hemiparesis following cerebral infarction affecting left non-dominant side: Secondary | ICD-10-CM | POA: Diagnosis not present

## 2019-10-29 DIAGNOSIS — I69398 Other sequelae of cerebral infarction: Secondary | ICD-10-CM | POA: Diagnosis not present

## 2019-10-29 DIAGNOSIS — R2681 Unsteadiness on feet: Secondary | ICD-10-CM | POA: Diagnosis not present

## 2019-10-29 DIAGNOSIS — R2689 Other abnormalities of gait and mobility: Secondary | ICD-10-CM | POA: Diagnosis not present

## 2019-10-29 DIAGNOSIS — H538 Other visual disturbances: Secondary | ICD-10-CM | POA: Diagnosis not present

## 2019-10-29 DIAGNOSIS — I69312 Visuospatial deficit and spatial neglect following cerebral infarction: Secondary | ICD-10-CM | POA: Diagnosis not present

## 2019-10-29 DIAGNOSIS — R278 Other lack of coordination: Secondary | ICD-10-CM | POA: Diagnosis not present

## 2019-10-31 DIAGNOSIS — R2681 Unsteadiness on feet: Secondary | ICD-10-CM | POA: Diagnosis not present

## 2019-10-31 DIAGNOSIS — I69398 Other sequelae of cerebral infarction: Secondary | ICD-10-CM | POA: Diagnosis not present

## 2019-10-31 DIAGNOSIS — R2689 Other abnormalities of gait and mobility: Secondary | ICD-10-CM | POA: Diagnosis not present

## 2019-10-31 DIAGNOSIS — H538 Other visual disturbances: Secondary | ICD-10-CM | POA: Diagnosis not present

## 2019-10-31 DIAGNOSIS — I69354 Hemiplegia and hemiparesis following cerebral infarction affecting left non-dominant side: Secondary | ICD-10-CM | POA: Diagnosis not present

## 2019-10-31 DIAGNOSIS — R278 Other lack of coordination: Secondary | ICD-10-CM | POA: Diagnosis not present

## 2019-10-31 DIAGNOSIS — I69312 Visuospatial deficit and spatial neglect following cerebral infarction: Secondary | ICD-10-CM | POA: Diagnosis not present

## 2019-11-05 DIAGNOSIS — I69398 Other sequelae of cerebral infarction: Secondary | ICD-10-CM | POA: Diagnosis not present

## 2019-11-05 DIAGNOSIS — I69312 Visuospatial deficit and spatial neglect following cerebral infarction: Secondary | ICD-10-CM | POA: Diagnosis not present

## 2019-11-05 DIAGNOSIS — I69354 Hemiplegia and hemiparesis following cerebral infarction affecting left non-dominant side: Secondary | ICD-10-CM | POA: Diagnosis not present

## 2019-11-05 DIAGNOSIS — H538 Other visual disturbances: Secondary | ICD-10-CM | POA: Diagnosis not present

## 2019-11-05 DIAGNOSIS — R2681 Unsteadiness on feet: Secondary | ICD-10-CM | POA: Diagnosis not present

## 2019-11-05 DIAGNOSIS — R278 Other lack of coordination: Secondary | ICD-10-CM | POA: Diagnosis not present

## 2019-11-05 DIAGNOSIS — R2689 Other abnormalities of gait and mobility: Secondary | ICD-10-CM | POA: Diagnosis not present

## 2019-11-07 DIAGNOSIS — R278 Other lack of coordination: Secondary | ICD-10-CM | POA: Diagnosis not present

## 2019-11-07 DIAGNOSIS — R2681 Unsteadiness on feet: Secondary | ICD-10-CM | POA: Diagnosis not present

## 2019-11-07 DIAGNOSIS — M6281 Muscle weakness (generalized): Secondary | ICD-10-CM | POA: Diagnosis not present

## 2019-11-07 DIAGNOSIS — R2689 Other abnormalities of gait and mobility: Secondary | ICD-10-CM | POA: Diagnosis not present

## 2019-11-07 DIAGNOSIS — I69354 Hemiplegia and hemiparesis following cerebral infarction affecting left non-dominant side: Secondary | ICD-10-CM | POA: Diagnosis not present

## 2019-11-07 DIAGNOSIS — H538 Other visual disturbances: Secondary | ICD-10-CM | POA: Diagnosis not present

## 2019-11-11 ENCOUNTER — Telehealth: Payer: Self-pay

## 2019-11-11 NOTE — Telephone Encounter (Signed)
LVM to rtn my call to r/s awv with nha

## 2019-11-12 DIAGNOSIS — R2689 Other abnormalities of gait and mobility: Secondary | ICD-10-CM | POA: Diagnosis not present

## 2019-11-12 DIAGNOSIS — R2681 Unsteadiness on feet: Secondary | ICD-10-CM | POA: Diagnosis not present

## 2019-11-12 DIAGNOSIS — H538 Other visual disturbances: Secondary | ICD-10-CM | POA: Diagnosis not present

## 2019-11-12 DIAGNOSIS — R278 Other lack of coordination: Secondary | ICD-10-CM | POA: Diagnosis not present

## 2019-11-12 DIAGNOSIS — I69354 Hemiplegia and hemiparesis following cerebral infarction affecting left non-dominant side: Secondary | ICD-10-CM | POA: Diagnosis not present

## 2019-11-12 DIAGNOSIS — M6281 Muscle weakness (generalized): Secondary | ICD-10-CM | POA: Diagnosis not present

## 2019-11-14 DIAGNOSIS — R278 Other lack of coordination: Secondary | ICD-10-CM | POA: Diagnosis not present

## 2019-11-14 DIAGNOSIS — M6281 Muscle weakness (generalized): Secondary | ICD-10-CM | POA: Diagnosis not present

## 2019-11-14 DIAGNOSIS — H538 Other visual disturbances: Secondary | ICD-10-CM | POA: Diagnosis not present

## 2019-11-14 DIAGNOSIS — R2681 Unsteadiness on feet: Secondary | ICD-10-CM | POA: Diagnosis not present

## 2019-11-14 DIAGNOSIS — I69354 Hemiplegia and hemiparesis following cerebral infarction affecting left non-dominant side: Secondary | ICD-10-CM | POA: Diagnosis not present

## 2019-11-14 DIAGNOSIS — R2689 Other abnormalities of gait and mobility: Secondary | ICD-10-CM | POA: Diagnosis not present

## 2019-11-18 ENCOUNTER — Ambulatory Visit: Payer: Medicare HMO | Admitting: Internal Medicine

## 2019-11-19 DIAGNOSIS — H538 Other visual disturbances: Secondary | ICD-10-CM | POA: Diagnosis not present

## 2019-11-19 DIAGNOSIS — I69354 Hemiplegia and hemiparesis following cerebral infarction affecting left non-dominant side: Secondary | ICD-10-CM | POA: Diagnosis not present

## 2019-11-19 DIAGNOSIS — R2689 Other abnormalities of gait and mobility: Secondary | ICD-10-CM | POA: Diagnosis not present

## 2019-11-19 DIAGNOSIS — R2681 Unsteadiness on feet: Secondary | ICD-10-CM | POA: Diagnosis not present

## 2019-11-19 DIAGNOSIS — R278 Other lack of coordination: Secondary | ICD-10-CM | POA: Diagnosis not present

## 2019-11-19 DIAGNOSIS — M6281 Muscle weakness (generalized): Secondary | ICD-10-CM | POA: Diagnosis not present

## 2019-11-22 ENCOUNTER — Telehealth: Payer: Self-pay | Admitting: Neurology

## 2019-11-22 NOTE — Telephone Encounter (Signed)
[name],Henry(wife) has called  To report pt is now taking 4 a day of the memantine (NAMENDA TITRATION PAK) tablet pack the pharmacy is telling her they can not fill again until Oct7th.  Wife is asking if a new script can be sent to pharmacy, please call.

## 2019-11-25 ENCOUNTER — Encounter: Payer: Self-pay | Admitting: *Deleted

## 2019-11-25 ENCOUNTER — Other Ambulatory Visit: Payer: Self-pay | Admitting: Neurology

## 2019-11-25 MED ORDER — MEMANTINE HCL 10 MG PO TABS: 10.0000 mg | ORAL_TABLET | Freq: Two times a day (BID) | ORAL | 5 refills | Status: DC

## 2019-11-25 NOTE — Telephone Encounter (Signed)
Henry [name] will order Namenda 10 mg twice daily

## 2019-11-25 NOTE — Telephone Encounter (Signed)
Namenda maintenance dose ordered. Sent my chart to inform.

## 2019-11-26 ENCOUNTER — Other Ambulatory Visit: Payer: Self-pay | Admitting: Neurology

## 2019-11-26 NOTE — Telephone Encounter (Signed)
Attempted to call pt, LVM for Call back. 

## 2019-11-26 NOTE — Telephone Encounter (Signed)
Kindly inform the patient's daughter that the starting dose of Namenda was a starter pack which had 5 mg tablets hence it was 2 tablets twice daily but the new prescription is for 10 mg tablets hence it should be 1 tablet twice daily

## 2019-11-26 NOTE — Telephone Encounter (Signed)
Pt's wife called stating that the dosage was to be changed to twice in the am and twice in the pm. Please advise.

## 2019-11-26 NOTE — Telephone Encounter (Signed)
Pt's wife, Emmette Katt called returned phone call.

## 2019-11-27 NOTE — Telephone Encounter (Signed)
Attempted to call pt, Left detailed Voice message  Pt's wife will call back if she has further questions

## 2019-11-28 ENCOUNTER — Other Ambulatory Visit: Payer: Self-pay | Admitting: *Deleted

## 2019-11-28 MED ORDER — MEMANTINE HCL 10 MG PO TABS: 10.0000 mg | ORAL_TABLET | Freq: Two times a day (BID) | ORAL | 5 refills | Status: DC

## 2019-11-28 NOTE — Telephone Encounter (Addendum)
Patient and wife came into office, stated pharmacy will not refill memantine, needs new Rx. Called pharmacy who stated he filled mematine on 11/12/19 x 30 days, insurance won't approve refill till 12/12/19. I advised phamracy of new Rx with new dose, she stated they never got it. I advised will resend Rx. Patient was notified of this by front office staff.

## 2019-12-02 ENCOUNTER — Encounter: Payer: Self-pay | Admitting: Internal Medicine

## 2019-12-02 ENCOUNTER — Other Ambulatory Visit: Payer: Self-pay

## 2019-12-02 ENCOUNTER — Ambulatory Visit (INDEPENDENT_AMBULATORY_CARE_PROVIDER_SITE_OTHER): Payer: Medicare HMO | Admitting: Internal Medicine

## 2019-12-02 DIAGNOSIS — R413 Other amnesia: Secondary | ICD-10-CM | POA: Diagnosis not present

## 2019-12-02 DIAGNOSIS — F22 Delusional disorders: Secondary | ICD-10-CM | POA: Diagnosis not present

## 2019-12-02 DIAGNOSIS — R410 Disorientation, unspecified: Secondary | ICD-10-CM | POA: Diagnosis not present

## 2019-12-02 DIAGNOSIS — R059 Cough, unspecified: Secondary | ICD-10-CM

## 2019-12-02 DIAGNOSIS — R05 Cough: Secondary | ICD-10-CM

## 2019-12-02 MED ORDER — QUETIAPINE FUMARATE 25 MG PO TABS: 12.5000 mg | ORAL_TABLET | Freq: Every day | ORAL | 3 refills | Status: DC

## 2019-12-02 NOTE — Assessment & Plan Note (Signed)
Better Pt reduced Seroquel to 1/2 tab at hs

## 2019-12-02 NOTE — Assessment & Plan Note (Signed)
[employer] [employer] 

## 2019-12-02 NOTE — Assessment & Plan Note (Signed)
Pt reduced Seroquel to 1/2 tab at hs

## 2019-12-02 NOTE — Progress Notes (Signed)
Subjective:  Patient ID: Henry [name], male    DOB: November 02, 1940  Age: 79 y.o. MRN: [id number]  CC: No chief complaint on file.   HPI LEONEL MCCOLLUM presents for CVA, memory loss, confusion - doing well C/o L side weakness - done w/PT Pt reduced Seroquel to 1/2 tab at hs C/o ongoing cough   Outpatient Medications Prior to Visit  Medication Sig Dispense Refill   apixaban (ELIQUIS) 5 MG TABS tablet Take 1 tablet (5 mg total) by mouth 2 (two) times daily. 60 tablet 11   atorvastatin (LIPITOR) 40 MG tablet Take 0.5 tablets (20 mg total) by mouth daily at 6 PM. 30 tablet 11   B Complex-Folic Acid (B COMPLEX PLUS) TABS Take 1 tablet by mouth daily. 100 tablet 3   Cholecalciferol (VITAMIN D3) 50 MCG (2000 UT) capsule Take 1 capsule (2,000 Units total) by mouth daily. 100 capsule 3   diltiazem (CARDIZEM) 60 MG tablet Take 1 tablet (60 mg total) by mouth 2 (two) times daily. 60 tablet 11   donepezil (ARICEPT) 5 MG tablet Take 1 tablet (5 mg total) by mouth at bedtime. 30 tablet 1   memantine (NAMENDA) 10 MG tablet Take 1 tablet (10 mg total) by mouth 2 (two) times daily. 60 tablet 5   Misc Natural Products (PROSTATE HEALTH) CAPS Take 1 capsule by mouth daily.     QUEtiapine (SEROQUEL) 25 MG tablet Take 1/2 tablet in am and 2 tablets at night 90 tablet 3   senna-docusate (SENOKOT-S) 8.6-50 MG tablet Take 2 tablets by mouth 2 (two) times daily. 100 tablet 6   clotrimazole-betamethasone (LOTRISONE) cream Apply 1 application topically 2 (two) times daily. (Patient not taking: Reported on 12/02/2019) 45 g 1   Probiotic Product (ALIGN PO) Take 1 capsule by mouth daily.  (Patient not taking: Reported on 12/02/2019)     No facility-administered medications prior to visit.    ROS: Review of Systems  Constitutional: Negative for appetite change, fatigue and unexpected weight change.  HENT: Negative for congestion, nosebleeds, sneezing, sore throat and trouble swallowing.   Eyes:  Negative for itching and visual disturbance.  Respiratory: Negative for cough.   Cardiovascular: Negative for chest pain, palpitations and leg swelling.  Gastrointestinal: Negative for abdominal distention, blood in stool, diarrhea and nausea.  Genitourinary: Negative for frequency and hematuria.  Musculoskeletal: Positive for gait problem. Negative for back pain, joint swelling and neck pain.  Skin: Negative for rash.  Neurological: Positive for weakness. Negative for dizziness, tremors and speech difficulty.  Psychiatric/Behavioral: Negative for agitation, dysphoric mood and sleep disturbance. The patient is not nervous/anxious.     Objective:  BP 128/76 (BP Location: Left Arm, Patient Position: Sitting, Cuff Size: Normal)    Pulse 78    Temp 98.3 F (36.8 C) (Oral)    Ht 5\' 8"  (1.727 m)    Wt 151 lb (68.5 kg)    SpO2 98%    BMI 22.96 kg/m   BP Readings from Last 3 Encounters:  12/02/19 128/76  10/14/19 123/76  10/07/19 (!) 96/62    Wt Readings from Last 3 Encounters:  12/02/19 151 lb (68.5 kg)  10/14/19 148 lb 3.2 oz (67.2 kg)  10/07/19 147 lb (66.7 kg)    Physical Exam Constitutional:      General: He is not in acute distress.    Appearance: He is well-developed.     Comments: NAD  Eyes:     Conjunctiva/sclera: Conjunctivae normal.     Pupils:  Pupils are equal, round, and reactive to light.  Neck:     Thyroid: No thyromegaly.     Vascular: No JVD.  Cardiovascular:     Rate and Rhythm: Normal rate and regular rhythm.     Heart sounds: Normal heart sounds. No murmur heard.  No friction rub. No gallop.   Pulmonary:     Effort: Pulmonary effort is normal. No respiratory distress.     Breath sounds: Normal breath sounds. No wheezing or rales.  Chest:     Chest wall: No tenderness.  Abdominal:     General: Bowel sounds are normal. There is no distension.     Palpations: Abdomen is soft. There is no mass.     Tenderness: There is no abdominal tenderness. There is no  guarding or rebound.  Musculoskeletal:        General: No tenderness. Normal range of motion.     Cervical back: Normal range of motion.  Lymphadenopathy:     Cervical: No cervical adenopathy.  Skin:    General: Skin is warm and dry.     Findings: No rash.  Neurological:     Mental Status: He is alert and oriented to person, place, and time.     Cranial Nerves: No cranial nerve deficit.     Motor: Weakness present. No abnormal muscle tone.     Coordination: Coordination abnormal.     Gait: Gait normal.     Deep Tendon Reflexes: Reflexes are normal and symmetric.  Psychiatric:        Behavior: Behavior normal.        Thought Content: Thought content normal.        Judgment: Judgment normal.     Lab Results  Component Value Date   WBC 6.7 09/05/2019   HGB 13.2 09/05/2019   HCT 38.5 (L) 09/05/2019   PLT 189.0 09/05/2019   GLUCOSE 109 (H) 09/05/2019   CHOL 147 08/26/2019   TRIG 180.0 (H) 08/26/2019   HDL 41.80 08/26/2019   LDLDIRECT 135.0 02/24/2017   LDLCALC 70 08/26/2019   ALT 21 09/05/2019   AST 21 09/05/2019   NA 140 09/05/2019   K 3.7 09/05/2019   CL 101 09/05/2019   CREATININE 0.90 09/05/2019   BUN 16 09/05/2019   CO2 32 09/05/2019   TSH 1.24 09/05/2019   PSA 1.75 02/24/2017   INR 1.2 04/17/2019   HGBA1C 6.6 (H) 05/29/2019    VAS US CAROTID  Result Date: 10/19/2019 Carotid Arterial Duplex Study Indications:       CVA, Weakness and Altered mental status. Risk Factors:      Hypertension. Other Factors:     Covid-19 04/2019. Comparison Study:  No prior study on file Performing Technologist: Sharion Dove RVS  Examination Guidelines: A complete evaluation includes B-mode imaging, spectral Doppler, color Doppler, and power Doppler as needed of all accessible portions of each vessel. Bilateral testing is considered an integral part of a complete examination. Limited examinations for reoccurring indications may be performed as noted.  Right Carotid Findings:  +----------+--------+--------+--------+------------------+------------------+             PSV cm/s EDV cm/s Stenosis Plaque Description Comments            +----------+--------+--------+--------+------------------+------------------+  CCA Prox   98       23  intimal thickening  +----------+--------+--------+--------+------------------+------------------+  CCA Distal 98       28                                   intimal thickening  +----------+--------+--------+--------+------------------+------------------+  ICA Prox   66       15                calcific                               +----------+--------+--------+--------+------------------+------------------+  ICA Distal 69       27                                                       +----------+--------+--------+--------+------------------+------------------+  ECA        95       20                                                       +----------+--------+--------+--------+------------------+------------------+ +----------+--------+-------+--------+-------------------+             PSV cm/s EDV cms Describe Arm Pressure (mmHG)  +----------+--------+-------+--------+-------------------+  Subclavian 93                                             +----------+--------+-------+--------+-------------------+ +---------+--------+--+--------+-+  Vertebral PSV cm/s 41 EDV cm/s 8  +---------+--------+--+--------+-+  Left Carotid Findings: +----------+--------+--------+--------+---------------------+------------------+             PSV cm/s EDV cm/s Stenosis Plaque Description    Comments            +----------+--------+--------+--------+---------------------+------------------+  CCA Prox   86       21                                      intimal thickening  +----------+--------+--------+--------+---------------------+------------------+  CCA Distal 95       30                                      intimal thickening   +----------+--------+--------+--------+---------------------+------------------+  ICA Prox   66       27                calcific and                                                                     irregular                                 +----------+--------+--------+--------+---------------------+------------------+  ICA Distal 90       28                                                          +----------+--------+--------+--------+---------------------+------------------+  ECA        106      25                                                          +----------+--------+--------+--------+---------------------+------------------+ +----------+--------+--------+--------+-------------------+             PSV cm/s EDV cm/s Describe Arm Pressure (mmHG)  +----------+--------+--------+--------+-------------------+  Subclavian 105                                             +----------+--------+--------+--------+-------------------+ +---------+--------+--+--------+-+  Vertebral PSV cm/s 42 EDV cm/s 9  +---------+--------+--+--------+-+   Summary: Right Carotid: Velocities in the right ICA are consistent with a 1-39% stenosis. Left Carotid: Velocities in the left ICA are consistent with a 1-39% stenosis. Vertebrals:  Bilateral vertebral arteries demonstrate antegrade flow. Subclavians: Normal flow hemodynamics were seen in bilateral subclavian              arteries. *See table(s) above for measurements and observations.  Electronically signed by Antony Contras MD on 10/19/2019 at 4:01:28 PM.    Final    VAS [place] LOWER EXTREMITY VENOUS (DVT)  Result Date: 10/18/2019  Lower Venous DVTStudy Indications: History of DVT 04/18/19 during Covid-19 infection.  Comparison Study: Prior study from 04/18/19 is available for comparison Performing Technologist: Sharion Dove RVS  Examination Guidelines: A complete evaluation includes B-mode imaging, spectral Doppler, color Doppler, and power Doppler as needed of all accessible  portions of each vessel. Bilateral testing is considered an integral part of a complete examination. Limited examinations for reoccurring indications may be performed as noted. The reflux portion of the exam is performed with the patient in reverse Trendelenburg.  +---------+---------------+---------+-----------+----------+--------------+  RIGHT     Compressibility Phasicity Spontaneity Properties Thrombus Aging  +---------+---------------+---------+-----------+----------+--------------+  CFV       Full            Yes       Yes                                    +---------+---------------+---------+-----------+----------+--------------+  SFJ       Full                                                             +---------+---------------+---------+-----------+----------+--------------+  FV Prox   Full                                                             +---------+---------------+---------+-----------+----------+--------------+  FV Mid    Full                                                             +---------+---------------+---------+-----------+----------+--------------+  FV Distal Full                                                             +---------+---------------+---------+-----------+----------+--------------+  PFV       Full                                                             +---------+---------------+---------+-----------+----------+--------------+  POP       Full            Yes       Yes                                    +---------+---------------+---------+-----------+----------+--------------+  PTV       Full                                                             +---------+---------------+---------+-----------+----------+--------------+  PERO      Full                                                             +---------+---------------+---------+-----------+----------+--------------+   +---------+---------------+---------+-----------+----------+--------------+  LEFT       Compressibility Phasicity Spontaneity Properties Thrombus Aging  +---------+---------------+---------+-----------+----------+--------------+  CFV       Full            Yes       Yes                                    +---------+---------------+---------+-----------+----------+--------------+  SFJ       Full                                                             +---------+---------------+---------+-----------+----------+--------------+  FV Prox   Full                                                             +---------+---------------+---------+-----------+----------+--------------+  FV Mid    Full                                                             +---------+---------------+---------+-----------+----------+--------------+  FV Distal Full                                                             +---------+---------------+---------+-----------+----------+--------------+  PFV       Full                                                             +---------+---------------+---------+-----------+----------+--------------+  POP       Full            Yes       Yes                                    +---------+---------------+---------+-----------+----------+--------------+  PTV       Full                                                             +---------+---------------+---------+-----------+----------+--------------+  PERO      Full                                                             +---------+---------------+---------+-----------+----------+--------------+  Gastroc   Full                                                             +---------+---------------+---------+-----------+----------+--------------+     Summary: BILATERAL: - No evidence of deep vein thrombosis seen in the lower extremities, bilaterally. -  LEFT: - Findings suggest resolution of previously noted thrombus in the gastroc.  *See table(s) above for measurements and observations. Electronically signed by Servando Snare MD  on 10/18/2019 at 8:16:49 AM.    Final     Assessment & Plan:    Walker Kehr, MD

## 2019-12-10 ENCOUNTER — Other Ambulatory Visit: Payer: Self-pay

## 2019-12-10 ENCOUNTER — Ambulatory Visit
Admission: RE | Admit: 2019-12-10 | Discharge: 2019-12-10 | Disposition: A | Discharge status: Home / Self Care | Payer: Medicare HMO | Source: Ambulatory Visit | Attending: Internal Medicine | Admitting: Internal Medicine

## 2019-12-10 DIAGNOSIS — R059 Cough, unspecified: Secondary | ICD-10-CM

## 2019-12-10 DIAGNOSIS — I2510 Atherosclerotic heart disease of native coronary artery without angina pectoris: Secondary | ICD-10-CM | POA: Diagnosis not present

## 2019-12-10 DIAGNOSIS — I700 Atherosclerosis of aorta: Secondary | ICD-10-CM | POA: Diagnosis not present

## 2019-12-10 DIAGNOSIS — J84112 Idiopathic pulmonary fibrosis: Secondary | ICD-10-CM | POA: Diagnosis not present

## 2020-01-06 ENCOUNTER — Other Ambulatory Visit: Payer: Self-pay

## 2020-01-06 ENCOUNTER — Encounter: Payer: Self-pay | Admitting: Adult Health

## 2020-01-06 ENCOUNTER — Ambulatory Visit: Payer: Medicare HMO | Admitting: Adult Health

## 2020-01-06 VITALS — BP 110/70 | HR 64 | Ht 68.0 in | Wt 154.0 lb

## 2020-01-06 DIAGNOSIS — I639 Cerebral infarction, unspecified: Secondary | ICD-10-CM

## 2020-01-06 DIAGNOSIS — I10 Essential (primary) hypertension: Secondary | ICD-10-CM

## 2020-01-06 DIAGNOSIS — I69319 Unspecified symptoms and signs involving cognitive functions following cerebral infarction: Secondary | ICD-10-CM

## 2020-01-06 DIAGNOSIS — U071 COVID-19: Secondary | ICD-10-CM | POA: Diagnosis not present

## 2020-01-06 DIAGNOSIS — Z86718 Personal history of other venous thrombosis and embolism: Secondary | ICD-10-CM

## 2020-01-06 DIAGNOSIS — E785 Hyperlipidemia, unspecified: Secondary | ICD-10-CM | POA: Diagnosis not present

## 2020-01-06 MED ORDER — ASPIRIN EC 81 MG PO TBEC: 81.0000 mg | DELAYED_RELEASE_TABLET | Freq: Every day | ORAL | 11 refills | Status: DC

## 2020-01-06 NOTE — Progress Notes (Signed)
[facility] [address] [place]. [place] [address] [phone]       OFFICE FOLLOW UPVISIT NOTE  Mr. Henry [name] Date of Birth:  08-16-1940 Medical Record Number:  [id number]   Referring MD: Rosalin Hawking  Reason for Referral: Stroke  Chief Complaint  Patient presents with  . Follow-up    rm 9  . Cerebrovascular Accident  . Memory Loss     HPI:   Today, 01/06/2020, Henry [name] returns for 77-month follow-up with history of stroke and vascular dementia with behaviors.  He has been doing well since prior visit with improvement of paranoia and behaviors.  Cognition stable since prior visit with possible improvement.  Dr. Leonie Man initiated Namenda at prior visit which she is continued on tolerating well without side effects.  He also remains on Seroquel and Aricept per PCP.  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  Denies new stroke/TIA symptoms.  Remains on Eliquis for secondary stroke prevention and DVT without bleeding or bruising.  Lower extremity Dopplers 10/17/2019 showed resolution of prior DVT.  Remains on atorvastatin 20 mg daily tolerating well without side effects.  Blood pressure today 110/70.  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  No further concerns at this time.    History provided for reference purposes only Update 10/14/2019 Dr. Leonie Man : He returns for follow-up after last visit 2 months ago.  Is accompanied by his wife.  Wife is concerned about patient's behavior which has changed.  Is having some bad thoughts and states times feels like getting himself.  He is also become paranoid and feels that wife is leaving him.  He has seen his primary care physician who recently started him on Seroquel and is currently taking 12 point 5 in the morning and 50 at night which seems to be be helping as is sleeping better.  He still have intermittent agitation, verbal abuse no physical threats as well as  confusion initially responded well to Seroquel but it was stopped but seems to be doing better with recent dose change.  He was also started on Aricept l and is taking 5 daily which she seems to be tolerating well but does not seem to have helped him much.  LDL cholesterol in 08/26/2019 was 70 mg percent but triglycerides were elevated at 180.  Lab work on 09/05/2019 showed vitamin B12 TSH and vitamin D to be normal.  CT scan of the head was done on 09/10/2019 due to a fall which showed no acute abnormalities.  He is currently doing outpatient physical occupational therapy and is ambulating well.  Walks with a cane.  He has had no recurrent stroke or TIA symptoms.  He remains on Eliquis which is tolerating well without bruising or bleeding.  At last visit had ordered an outpatient carotid ultrasound which for unclear reason has not yet been done.  Initial visit 08/20/2019 Dr. Leonie Man: Mr. Henry [name] is a 79 year old Caucasian male seen today for initial office consultation visit.  History is obtained from the patient, review of electronic medical records and have personally reviewed imaging films in PACS.  Patient has past medical history of hypertension, polycythemia, low back pain and recent Covid infection who presented to [facility] on 04/17/2019 as a code stroke for sudden onset of left-sided weakness and neglect.  On arrival he had NIH stroke scale of 16.  CT scan of the head showed early acute ischemic changes involving the right MCA territory  and is aspect score was 4.  CT perfusion however showed only 10 mm core infarct with 147 cc of penumbra with a mismatch of 137 mL.  Due to this mismatch patient was taken for emergent MRI scan which showed a large area of restricted diffusion however with corresponding T2 flair mismatch hence after discussion with patient and daughter as well as interventional neuroradiologist it was decided to take the patient for mechanical thrombectomy which was performed successfully  with TICI 2 c reveasularization by Dr. Estanislado Pandy.  He was kept in the intensive care unit for blood pressure was tightly controlled.  Follow-up scan showed large right MCA infarct with some petechial hemorrhage with local mass-effect.  2D echo showed ejection fraction 55 to 60% without cardiac source of embolism.  Lower extremity venous Doppler showed DVT in the left gastrocnemius vein.  Transcranial Doppler study was negative for PFO.  LDL cholesterol was 53 mg percent hemoglobin A1c was 6.8.  Patient was transferred to skilled nursing facility for rehab and started on Eliquis for his DVT.  Patient states she is done well he has finished rehabilitation and skilled nursing facility and is presently living at home.  He is finished home physical and occupational therapy and plans to start outpatient therapy this week.  He is able to ambulate with a cane he states his balance is good is an only one minor fall.  He is on Eliquis which is tolerating well without bleeding or bruising.  His blood pressures well controlled today it is 126/74.  He is tolerating Lipitor well without muscle aches and pains but he states he is constipated and feels this may be related to Lipitor.    ROS:   14 system review of systems is positive for those listed in HPI and all other systems negative  PMH:  Past Medical History:  Diagnosis Date  . Colon polyps   . COVID-19   . Elevated MCV 2011  . GERD (gastroesophageal reflux disease) 2012  . 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 Osteoarthritis   . Polycythemia 2011    Social History:  Social History   Socioeconomic History  . Marital status: Married    Spouse name: Henry [name]  . Number of children: 3  . Years of education: 27  . Highest education level: Not on file  Occupational History  . Occupation: Retired    Fish farm manager: RETIRED  Tobacco Use  . Smoking status: Former Smoker    Packs/day: 1.00    Types: Cigarettes     Quit date: 05/24/1976    Years since quitting: 43.6  . Smokeless tobacco: Never Used  Vaping Use  . Vaping Use: Never used  Substance and Sexual Activity  . Alcohol use: No  . Drug use: No  . Sexual activity: Not Currently  Other Topics Concern  . Not on file  Social History Narrative  . Not on file   Social Determinants of Health   Financial Resource Strain:   . Difficulty of Paying Living Expenses: Not on file  Food Insecurity:   . Worried About Charity fundraiser in the Last Year: Not on file  . Ran Out of Food in the Last Year: Not on file  Transportation Needs:   . Lack of Transportation (Medical): Not on file  . Lack of Transportation (Non-Medical): Not on file  Physical Activity:   . Days of Exercise per Week: Not on file  .  Minutes of Exercise per Session: Not on file  Stress:   . Feeling of Stress : Not on file  Social Connections:   . Frequency of Communication with Friends and Family: Not on file  . Frequency of Social Gatherings with Friends and Family: Not on file  . Attends Religious Services: Not on file  . Active Member of Clubs or Organizations: Not on file  . Attends Archivist Meetings: Not on file  . Marital Status: Not on file  Intimate Partner Violence:   . Fear of Current or Ex-Partner: Not on file  . Emotionally Abused: Not on file  . Physically Abused: Not on file  . Sexually Abused: Not on file    Medications:   Current Outpatient Medications on File Prior to Visit  Medication Sig Dispense Refill  . apixaban (ELIQUIS) 5 MG TABS tablet Take 1 tablet (5 mg total) by mouth 2 (two) times daily. 60 tablet 11  . atorvastatin (LIPITOR) 40 MG tablet Take 0.5 tablets (20 mg total) by mouth daily at 6 PM. 30 tablet 11  . B Complex-Folic Acid (B COMPLEX PLUS) TABS Take 1 tablet by mouth daily. 100 tablet 3  . Cholecalciferol (VITAMIN D3) 50 MCG (2000 UT) capsule Take 1 capsule (2,000 Units total) by mouth daily. 100 capsule 3  .  clotrimazole-betamethasone (LOTRISONE) cream Apply 1 application topically 2 (two) times daily. 45 g 1  . diltiazem (CARDIZEM) 60 MG tablet Take 1 tablet (60 mg total) by mouth 2 (two) times daily. 60 tablet 11  . donepezil (ARICEPT) 5 MG tablet Take 1 tablet (5 mg total) by mouth at bedtime. 30 tablet 1  . memantine (NAMENDA) 10 MG tablet Take 1 tablet (10 mg total) by mouth 2 (two) times daily. 60 tablet 5  . Misc Natural Products (PROSTATE HEALTH) CAPS Take 1 capsule by mouth daily.    . Probiotic Product (ALIGN PO) Take 1 capsule by mouth daily.     . QUEtiapine (SEROQUEL) 25 MG tablet Take 0.5 tablets (12.5 mg total) by mouth at bedtime. Take 1/2 tablet in am and 2 tablets at night 90 tablet 3  . senna-docusate (SENOKOT-S) 8.6-50 MG tablet Take 2 tablets by mouth 2 (two) times daily. 100 tablet 6   No current facility-administered medications on file prior to visit.    Allergies:   Allergies  Allergen Reactions  . No Known Allergies     Physical Exam Today's Vitals   01/06/20 0738  BP: 110/70  Pulse: 64  Weight: 154 lb (69.9 kg)  Height: 5\' 8"  (1.727 m)   Body mass index is 23.42 kg/m.   General: well developed, well nourished pleasant elderly Caucasian male, seated, in no evident distress Head: head normocephalic and atraumatic.   Neck: supple with no carotid or supraclavicular bruits Cardiovascular: regular rate and rhythm, soft ejection murmur. Musculoskeletal: no deformity Skin:  no rash/petichiae Vascular:  Normal pulses all extremities  Neurologic Exam Mental Status: Awake and fully alert. oriented to place and time. Recent memory impaired and remote memory intact. Attention span, concentration and fund of knowledge appropriate but wife frequently provided history or answer questions. Mood and affect appropriate.  MMSE - Mini Mental State Exam 01/06/2020 10/14/2019 02/26/2018  Orientation to time 3 1 5   Orientation to Place 5 3 5   Registration 3 3 3   Attention/  Calculation 5 5 5   Recall 2 1 2   Language- name 2 objects 2 2 2   Language- repeat 1 1 1   Language-  follow 3 step command 3 3 3   Language- read & follow direction 1 1 1   Write a sentence 1 0 1  Write a sentence-comments - no subject -  Copy design 1 0 1  Total score 27 20 29    Cranial Nerves: Pupils equal, briskly reactive to light. Extraocular movements full without nystagmus. 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 Hearing diminished bilaterally. Facial sensation intact.  Mild left lower facial asymmetry., tongue, palate moves normally and symmetrically.  Motor: Spastic left hemiparesis with grade 3/5 strength at left shoulder and elbow with significant weakness of left grip and fingers in intrinsic hand muscles but is able to bend fingers and make a weak grip.  Minimal LLE weakness with grade 4+-5/5 distally Sensory.: intact to touch , pinprick , position and vibratory sensation.  Coordination: Rapid alternating movements normal in all extremities except decreased left hand. Finger-to-nose performed accurately RUE and heel-to-shin performed accurately bilaterally. Gait and Station: Arises from chair without difficulty. Stance is normal. Gait demonstrates mild stiffness and circumduction of the left leg with mild foot drop Reflexes: 1+ and asymmetric and brisker on the left. Toes downgoing.      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  Diagnosed with acute DVT in the left leg and treated with Eliquis.  Repeat lower extremity Dopplers showed resolution of prior DVT.  Prior visit concerns of behavioral changes of agitation, confusion and paranoid ideation likely from vascular dementia    R MCA stroke -Residual left spastic hemiparesis and visual impairment with  improvement from prior visit -After discussion with Dr. Leonie Man, advised to discontinue Eliquis and initiate aspirin 81 mg daily for secondary stroke prevention as recent lower extremity ultrasound showed resolution of prior DVT.  No other indication for continuation of Eliquis. Per Dr. Leonie Man, recommends repeating hypercoagulable labs 1 week after discontinuing -Discussed secondary stroke prevention measures and importance of close PCP follow-up for aggressive stroke risk factor management including HTN with BP goal<130/90 and HLD with LDL goal<70.   Mild vascular cognitive impairment with behaviors -Greatly improved in initiating Namenda and recommend continuation -MMSE today 27/30 with prior 20/30 -Continue Seroquel and Aricept as prescribed by PCP.  -Discussed importance of managing stroke risk factors as well as memory exercises such as crossword puzzles, word search, sudoku, card games and reading   Follow-up in 6 months or call earlier if needed  CC:  [place] provider: Dr. Hilton Cork, [name], MD    I spent 35 minutes of face-to-face and non-face-to-face time with patient and wife.  This included previsit chart review, lab review, study review, order entry, electronic health record documentation, patient and wife education and discussion regarding prior stroke with residual deficits, importance of managing stroke risk factors, completion and review of MMSE, and answered all other questions to patient and wife satisfaction   Frann Rider, AGNP-BC  [facility] [address] [place], [place] [address]  Phone [phone] Fax [phone] Note: This document was prepared with digital dictation and possible smart phrase technology. Any transcriptional errors that result from this process are unintentional.

## 2020-01-06 NOTE — Progress Notes (Signed)
I agree with the above plan 

## 2020-01-06 NOTE — Patient Instructions (Addendum)
Continue Namenda 10mg  twice daily for vascular cognitive impairment  Continue seroquel and aricept as prescribed by your PCP  Due to recent lower extremity ultrasound, we will likely be able to discontinue eliquis and start aspirin - I will verify this with Dr. Leonie Man and let you know  Contact the [email or web address] or [phone] for more information regarding clinic trial   Continue to follow up with PCP regarding cholesterol and blood pressure management  Maintain strict control of hypertension with blood pressure goal below 130/90 and cholesterol with LDL cholesterol (bad cholesterol) goal below 70 mg/dL.      Followup in the future with me in 6 months or call earlier if needed       Thank you for coming to see [place] at [facility]. I hope we have been able to provide you high quality care today.  You may receive a patient satisfaction survey over the next few weeks. We would appreciate your feedback and comments so that we may continue to improve ourselves and the health of our patients.

## 2020-01-13 ENCOUNTER — Other Ambulatory Visit: Payer: Self-pay | Admitting: Adult Health

## 2020-01-13 ENCOUNTER — Other Ambulatory Visit (INDEPENDENT_AMBULATORY_CARE_PROVIDER_SITE_OTHER): Payer: Self-pay

## 2020-01-13 DIAGNOSIS — D6869 Other thrombophilia: Secondary | ICD-10-CM

## 2020-01-13 DIAGNOSIS — Z0289 Encounter for other administrative examinations: Secondary | ICD-10-CM

## 2020-01-13 DIAGNOSIS — U071 COVID-19: Secondary | ICD-10-CM

## 2020-01-13 NOTE — Addendum Note (Signed)
Addended by: Mal Misty on: 01/13/2020 11:23 AM   Modules accepted: Orders

## 2020-01-14 LAB — D-DIMER, QUANTITATIVE: D-DIMER: 0.26 mg/L FEU (ref 0.00–0.49)

## 2020-01-14 LAB — C-REACTIVE PROTEIN: CRP: <1 mg/L (ref 0–10)

## 2020-02-25 ENCOUNTER — Other Ambulatory Visit: Payer: Self-pay

## 2020-02-25 ENCOUNTER — Ambulatory Visit (INDEPENDENT_AMBULATORY_CARE_PROVIDER_SITE_OTHER): Payer: Medicare HMO | Admitting: Internal Medicine

## 2020-02-25 ENCOUNTER — Encounter: Payer: Self-pay | Admitting: Internal Medicine

## 2020-02-25 VITALS — BP 120/82 | HR 65 | Temp 98.2°F | Wt 159.6 lb

## 2020-02-25 DIAGNOSIS — Z23 Encounter for immunization: Secondary | ICD-10-CM | POA: Diagnosis not present

## 2020-02-25 DIAGNOSIS — I10 Essential (primary) hypertension: Secondary | ICD-10-CM

## 2020-02-25 DIAGNOSIS — R413 Other amnesia: Secondary | ICD-10-CM

## 2020-02-25 DIAGNOSIS — I82402 Acute embolism and thrombosis of unspecified deep veins of left lower extremity: Secondary | ICD-10-CM | POA: Diagnosis not present

## 2020-02-25 DIAGNOSIS — I69354 Hemiplegia and hemiparesis following cerebral infarction affecting left non-dominant side: Secondary | ICD-10-CM

## 2020-02-25 LAB — CBC WITH DIFFERENTIAL/PLATELET
Basophils Absolute: 0 10*3/uL (ref 0.0–0.1)
Basophils Relative: 0.8 % (ref 0.0–3.0)
Eosinophils Absolute: 0.3 10*3/uL (ref 0.0–0.7)
Eosinophils Relative: 5.1 % — ABNORMAL HIGH (ref 0.0–5.0)
HCT: 40.5 % (ref 39.0–52.0)
Hemoglobin: 13.9 g/dL (ref 13.0–17.0)
Lymphocytes Relative: 42.1 % (ref 12.0–46.0)
Lymphs Abs: 2.5 10*3/uL (ref 0.7–4.0)
MCHC: 34.2 g/dL (ref 30.0–36.0)
MCV: 111.7 fl — ABNORMAL HIGH (ref 78.0–100.0)
Monocytes Absolute: 0.5 10*3/uL (ref 0.1–1.0)
Monocytes Relative: 8.6 % (ref 3.0–12.0)
Neutro Abs: 2.6 10*3/uL (ref 1.4–7.7)
Neutrophils Relative %: 43.4 % (ref 43.0–77.0)
Platelets: 171 10*3/uL (ref 150.0–400.0)
RBC: 3.63 Mil/uL — ABNORMAL LOW (ref 4.22–5.81)
RDW: 14.4 % (ref 11.5–15.5)
WBC: 5.9 10*3/uL (ref 4.0–10.5)

## 2020-02-25 LAB — COMPREHENSIVE METABOLIC PANEL
ALT: 16 U/L (ref 0–53)
AST: 19 U/L (ref 0–37)
Albumin: 4.5 g/dL (ref 3.5–5.2)
Alkaline Phosphatase: 80 U/L (ref 39–117)
BUN: 16 mg/dL (ref 6–23)
CO2: 29 mEq/L (ref 19–32)
Calcium: 10.2 mg/dL (ref 8.4–10.5)
Chloride: 104 mEq/L (ref 96–112)
Creatinine, Ser: 0.98 mg/dL (ref 0.40–1.50)
GFR: 74.18 mL/min (ref >60.00)
Glucose, Bld: 101 mg/dL — ABNORMAL HIGH (ref 70–99)
Potassium: 4.2 mEq/L (ref 3.5–5.1)
Sodium: 140 mEq/L (ref 135–145)
Total Bilirubin: 0.5 mg/dL (ref 0.2–1.2)
Total Protein: 7.7 g/dL (ref 6.0–8.3)

## 2020-02-25 NOTE — Addendum Note (Signed)
Addended by: Earnstine Regal on: 02/25/2020 09:55 AM   Modules accepted: Orders

## 2020-02-25 NOTE — Assessment & Plan Note (Signed)
On Diltiazem

## 2020-02-25 NOTE — Assessment & Plan Note (Addendum)
Try Lion's Mane Mushroom

## 2020-02-25 NOTE — Patient Instructions (Signed)
   B-complex with Niacin 100 mg    Lion's mane  

## 2020-02-25 NOTE — Addendum Note (Signed)
Addended by: Trenda Moots on: 97/74/1423 10:01 AM   Modules accepted: Orders

## 2020-02-25 NOTE — Assessment & Plan Note (Signed)
F/u w/Dr Sethi 

## 2020-02-25 NOTE — Progress Notes (Signed)
Subjective:  Patient ID: Henry [name], male    DOB: 08-Jul-1940  Age: 79 y.o. MRN: [id number]  CC: Follow-up (3 MONTH F/U)   HPI CAMILO MANDER presents for CVA, L hemiparesis, HTN f/u. F/u DVT - on ASA now Comes w/wife Advised to get a COVID shot  Outpatient Medications Prior to Visit  Medication Sig Dispense Refill  . aspirin EC 81 MG tablet Take 1 tablet (81 mg total) by mouth daily. Swallow whole. 30 tablet 11  . atorvastatin (LIPITOR) 40 MG tablet Take 0.5 tablets (20 mg total) by mouth daily at 6 PM. 30 tablet 11  . B Complex-Folic Acid (B COMPLEX PLUS) TABS Take 1 tablet by mouth daily. 100 tablet 3  . Cholecalciferol (VITAMIN D3) 50 MCG (2000 UT) capsule Take 1 capsule (2,000 Units total) by mouth daily. 100 capsule 3  . diltiazem (CARDIZEM) 60 MG tablet Take 1 tablet (60 mg total) by mouth 2 (two) times daily. 60 tablet 11  . donepezil (ARICEPT) 5 MG tablet Take 1 tablet (5 mg total) by mouth at bedtime. 30 tablet 1  . memantine (NAMENDA) 10 MG tablet Take 1 tablet (10 mg total) by mouth 2 (two) times daily. 60 tablet 5  . Misc Natural Products (PROSTATE HEALTH) CAPS Take 1 capsule by mouth daily.    . QUEtiapine (SEROQUEL) 25 MG tablet Take 0.5 tablets (12.5 mg total) by mouth at bedtime. Take 1/2 tablet in am and 2 tablets at night 90 tablet 3  . clotrimazole-betamethasone (LOTRISONE) cream Apply 1 application topically 2 (two) times daily. (Patient not taking: Reported on 02/25/2020) 45 g 1  . Probiotic Product (ALIGN PO) Take 1 capsule by mouth daily.  (Patient not taking: Reported on 02/25/2020)    . senna-docusate (SENOKOT-S) 8.6-50 MG tablet Take 2 tablets by mouth 2 (two) times daily. (Patient not taking: Reported on 02/25/2020) 100 tablet 6   No facility-administered medications prior to visit.    ROS: Review of Systems  Constitutional: Positive for fatigue. Negative for appetite change and unexpected weight change.  HENT: Negative for congestion, nosebleeds,  sneezing, sore throat and trouble swallowing.   Eyes: Negative for itching and visual disturbance.  Respiratory: Negative for cough.   Cardiovascular: Negative for chest pain, palpitations and leg swelling.  Gastrointestinal: Negative for abdominal distention, blood in stool, diarrhea and nausea.  Genitourinary: Negative for frequency and hematuria.  Musculoskeletal: Positive for gait problem. Negative for back pain, joint swelling and neck pain.  Skin: Negative for rash.  Neurological: Positive for weakness. Negative for dizziness, tremors and speech difficulty.  Psychiatric/Behavioral: Positive for behavioral problems, confusion and decreased concentration. Negative for agitation, dysphoric mood, sleep disturbance and suicidal ideas. The patient is not nervous/anxious.     Objective:  BP 120/82 (BP Location: Left Arm)   Pulse 65   Temp 98.2 F (36.8 C) (Oral)   Wt 159 lb 9.6 oz (72.4 kg)   SpO2 98%   BMI 24.27 kg/m   BP Readings from Last 3 Encounters:  02/25/20 120/82  01/06/20 110/70  12/02/19 128/76    Wt Readings from Last 3 Encounters:  02/25/20 159 lb 9.6 oz (72.4 kg)  01/06/20 154 lb (69.9 kg)  12/02/19 151 lb (68.5 kg)    Physical Exam Constitutional:      General: He is not in acute distress.    Appearance: He is well-developed.     Comments: NAD  HENT:     Mouth/Throat:     Mouth: Oropharynx is  clear and moist.  Eyes:     Conjunctiva/sclera: Conjunctivae normal.     Pupils: Pupils are equal, round, and reactive to light.  Neck:     Thyroid: No thyromegaly.     Vascular: No JVD.  Cardiovascular:     Rate and Rhythm: Normal rate and regular rhythm.     Pulses: Intact distal pulses.     Heart sounds: Normal heart sounds. No murmur heard. No friction rub. No gallop.   Pulmonary:     Effort: Pulmonary effort is normal. No respiratory distress.     Breath sounds: Normal breath sounds. No wheezing or rales.  Chest:     Chest wall: No tenderness.   Abdominal:     General: Bowel sounds are normal. There is no distension.     Palpations: Abdomen is soft. There is no mass.     Tenderness: There is no abdominal tenderness. There is no guarding or rebound.  Musculoskeletal:        General: No tenderness or edema. Normal range of motion.     Cervical back: Normal range of motion.  Lymphadenopathy:     Cervical: No cervical adenopathy.  Skin:    General: Skin is warm and dry.     Findings: No rash.  Neurological:     Mental Status: He is alert and oriented to person, place, and time.     Cranial Nerves: No cranial nerve deficit.     Motor: No abnormal muscle tone.     Coordination: He displays a negative Romberg sign. Coordination abnormal.     Gait: Gait abnormal.     Deep Tendon Reflexes: Reflexes are normal and symmetric.  Psychiatric:        Mood and Affect: Mood and affect normal.        Behavior: Behavior normal.        Thought Content: Thought content normal.        Judgment: Judgment normal.   L hemiparesis   Lab Results  Component Value Date   WBC 6.7 09/05/2019   HGB 13.2 09/05/2019   HCT 38.5 (L) 09/05/2019   PLT 189.0 09/05/2019   GLUCOSE 109 (H) 09/05/2019   CHOL 147 08/26/2019   TRIG 180.0 (H) 08/26/2019   HDL 41.80 08/26/2019   LDLDIRECT 135.0 02/24/2017   LDLCALC 70 08/26/2019   ALT 21 09/05/2019   AST 21 09/05/2019   NA 140 09/05/2019   K 3.7 09/05/2019   CL 101 09/05/2019   CREATININE 0.90 09/05/2019   BUN 16 09/05/2019   CO2 32 09/05/2019   TSH 1.24 09/05/2019   PSA 1.75 02/24/2017   INR 1.2 04/17/2019   HGBA1C 6.6 (H) 05/29/2019    CT Chest Wo Contrast  Result Date: 12/11/2019 CLINICAL DATA:  Persistent cough, sometimes productive. History of COVID infection in February. EXAM: CT CHEST WITHOUT CONTRAST TECHNIQUE: Multidetector CT imaging of the chest was performed following the standard protocol without IV contrast. COMPARISON:  None. FINDINGS: Cardiovascular: Atherosclerotic calcification  of the aorta, aortic valve and coronary arteries. Distal transverse aorta is mildly ectatic, 3.1 cm. Heart is enlarged. No pericardial effusion. Mediastinum/Nodes: Mediastinal lymph nodes measure up to 11 mm in the low right paratracheal station. Hilar regions are difficult to evaluate without IV contrast. No axillary adenopathy. Esophagus is unremarkable. Lungs/Pleura: Biapical pleuroparenchymal scarring. Centrilobular and paraseptal emphysema. Peripheral subpleural reticular densities and ground-glass with traction bronchiectasis/bronchiolectasis. Difficult to differentiate paraseptal emphysema from honeycombing, especially in the left upper lobe. No pleural fluid. Minimal  debris in the airway. Upper Abdomen: Visualized portions of the liver, gallbladder, adrenal glands, right kidney, spleen, pancreas, stomach and bowel are grossly unremarkable. Musculoskeletal: Degenerative changes in the spine. No worrisome lytic or sclerotic lesions. IMPRESSION: 1. Pulmonary parenchymal pattern of fibrosis can be seen with usual interstitial pneumonitis or nonspecific interstitial pneumonitis. 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 2. Borderline enlarged mediastinal lymph nodes be seen in the setting of interstitial lung disease. Difficult to definitively exclude a lymphoproliferative disorder. 3. Aortic atherosclerosis (ICD10-I70.0). Coronary artery calcification. 4.  Emphysema (ICD10-J43.9). Electronically Signed   By: Lorin Picket M.D.   On: 12/11/2019 08:04    Assessment & Plan:    Walker Kehr, MD

## 2020-02-25 NOTE — Assessment & Plan Note (Addendum)
Done w/Eliquis. On ASA

## 2020-03-27 ENCOUNTER — Telehealth (INDEPENDENT_AMBULATORY_CARE_PROVIDER_SITE_OTHER): Payer: Medicare HMO | Admitting: Internal Medicine

## 2020-03-27 DIAGNOSIS — J399 Disease of upper respiratory tract, unspecified: Secondary | ICD-10-CM | POA: Diagnosis not present

## 2020-03-27 NOTE — Progress Notes (Deleted)
Virtual Visit via Video Note  I connected with Henry [name] on 03/27/20 at 11:40 AM EST by a video enabled telemedicine application and verified that I am speaking with the correct person using two identifiers.   I discussed the limitations of evaluation and management by telemedicine and the availability of in person appointments. The patient expressed understanding and agreed to proceed.  I was located at our [facility] office. The patient was at home. There was no one else present in the visit.   History of Present Illness: C/o URI sx's x 2 days, weak Wife had COVID 19 last week  There has been no runny nose, cough, chest pain, shortness of breath, abdominal pain, diarrhea, constipation, arthralgias, skin rashes.   Observations/Objective: The patient appears to be in no acute distress, looks well.  Assessment and Plan:  See my Assessment and Plan. Follow Up Instructions:    I discussed the assessment and treatment plan with the patient. The patient was provided an opportunity to ask questions and all were answered. The patient agreed with the plan and demonstrated an understanding of the instructions.   The patient was advised to call back or seek an in-person evaluation if the symptoms worsen or if the condition fails to improve as anticipated.  I provided face-to-face time during this encounter. We were at different locations.   Walker Kehr, MD

## 2020-03-30 ENCOUNTER — Encounter: Payer: Self-pay | Admitting: Internal Medicine

## 2020-03-30 DIAGNOSIS — J399 Disease of upper respiratory tract, unspecified: Secondary | ICD-10-CM | POA: Insufficient documentation

## 2020-03-30 NOTE — Progress Notes (Signed)
Virtual Visit via Telephone Note  I connected with Henry [name] on 03/30/20 at 11:40 AM EST by telephone and verified that I am speaking with the correct person using two identifiers.  Location: Patient: at home w/wife Provider: [facility]   I discussed the limitations, risks, security and privacy concerns of performing an evaluation and management service by telephone and the availability of in person appointments. I also discussed with the patient that there may be a patient responsible charge related to this service. The patient expressed understanding and agreed to proceed.   History of Present Illness:   C/o URI sx's x 2 days, weak Wife had COVID 19 last week  There has been no chest pain, shortness of breath, abdominal pain, diarrhea, constipation, arthralgias, skin rashes.  Observations/Objective: NAD  Assessment and Plan:  See plan Follow Up Instructions:    I discussed the assessment and treatment plan with the patient. The patient was provided an opportunity to ask questions and all were answered. The patient agreed with the plan and demonstrated an understanding of the instructions.   The patient was advised to call back or seek an in-person evaluation if the symptoms worsen or if the condition fails to improve as anticipated.  I provided 12 minutes of non-face-to-face time during this encounter.   Walker Kehr, MD

## 2020-03-30 NOTE — Assessment & Plan Note (Signed)
Possible COVID 19 Discussed - OTC meds, hydration Call if worse

## 2020-05-25 ENCOUNTER — Other Ambulatory Visit: Payer: Self-pay | Admitting: Internal Medicine

## 2020-06-01 ENCOUNTER — Encounter: Payer: Self-pay | Admitting: Internal Medicine

## 2020-06-01 ENCOUNTER — Ambulatory Visit (INDEPENDENT_AMBULATORY_CARE_PROVIDER_SITE_OTHER): Payer: Medicare HMO | Admitting: Internal Medicine

## 2020-06-01 ENCOUNTER — Other Ambulatory Visit: Payer: Self-pay

## 2020-06-01 VITALS — BP 130/78 | HR 57 | Temp 97.9°F | Ht 68.0 in | Wt 164.0 lb

## 2020-06-01 DIAGNOSIS — R059 Cough, unspecified: Secondary | ICD-10-CM | POA: Diagnosis not present

## 2020-06-01 DIAGNOSIS — I10 Essential (primary) hypertension: Secondary | ICD-10-CM

## 2020-06-01 DIAGNOSIS — I6601 Occlusion and stenosis of right middle cerebral artery: Secondary | ICD-10-CM

## 2020-06-01 DIAGNOSIS — J439 Emphysema, unspecified: Secondary | ICD-10-CM | POA: Diagnosis not present

## 2020-06-01 DIAGNOSIS — R413 Other amnesia: Secondary | ICD-10-CM | POA: Diagnosis not present

## 2020-06-01 DIAGNOSIS — E785 Hyperlipidemia, unspecified: Secondary | ICD-10-CM | POA: Diagnosis not present

## 2020-06-01 DIAGNOSIS — R269 Unspecified abnormalities of gait and mobility: Secondary | ICD-10-CM

## 2020-06-01 DIAGNOSIS — D751 Secondary polycythemia: Secondary | ICD-10-CM | POA: Diagnosis not present

## 2020-06-01 DIAGNOSIS — I700 Atherosclerosis of aorta: Secondary | ICD-10-CM

## 2020-06-01 LAB — COMPREHENSIVE METABOLIC PANEL
ALT: 19 U/L (ref 0–53)
AST: 18 U/L (ref 0–37)
Albumin: 4.3 g/dL (ref 3.5–5.2)
Alkaline Phosphatase: 74 U/L (ref 39–117)
BUN: 15 mg/dL (ref 6–23)
CO2: 31 mEq/L (ref 19–32)
Calcium: 9.5 mg/dL (ref 8.4–10.5)
Chloride: 104 mEq/L (ref 96–112)
Creatinine, Ser: 0.98 mg/dL (ref 0.40–1.50)
GFR: 74.04 mL/min (ref >60.00)
Glucose, Bld: 113 mg/dL — ABNORMAL HIGH (ref 70–99)
Potassium: 4.1 mEq/L (ref 3.5–5.1)
Sodium: 141 mEq/L (ref 135–145)
Total Bilirubin: 0.5 mg/dL (ref 0.2–1.2)
Total Protein: 6.9 g/dL (ref 6.0–8.3)

## 2020-06-01 LAB — CBC WITH DIFFERENTIAL/PLATELET
Basophils Absolute: 0 10*3/uL (ref 0.0–0.1)
Basophils Relative: 0.4 % (ref 0.0–3.0)
Eosinophils Absolute: 0.5 10*3/uL (ref 0.0–0.7)
Eosinophils Relative: 9.4 % — ABNORMAL HIGH (ref 0.0–5.0)
HCT: 38 % — ABNORMAL LOW (ref 39.0–52.0)
Hemoglobin: 13.2 g/dL (ref 13.0–17.0)
Lymphocytes Relative: 41.1 % (ref 12.0–46.0)
Lymphs Abs: 2 10*3/uL (ref 0.7–4.0)
MCHC: 34.6 g/dL (ref 30.0–36.0)
MCV: 111.5 fl — ABNORMAL HIGH (ref 78.0–100.0)
Monocytes Absolute: 0.4 10*3/uL (ref 0.1–1.0)
Monocytes Relative: 8.4 % (ref 3.0–12.0)
Neutro Abs: 2 10*3/uL (ref 1.4–7.7)
Neutrophils Relative %: 40.7 % — ABNORMAL LOW (ref 43.0–77.0)
Platelets: 152 10*3/uL (ref 150.0–400.0)
RBC: 3.41 Mil/uL — ABNORMAL LOW (ref 4.22–5.81)
RDW: 14.7 % (ref 11.5–15.5)
WBC: 4.9 10*3/uL (ref 4.0–10.5)

## 2020-06-01 NOTE — Assessment & Plan Note (Addendum)
L knee would buckle PMR ref - ?knee brace

## 2020-06-01 NOTE — Progress Notes (Signed)
Subjective:  Patient ID: Henry [name], male    DOB: 02-09-1941  Age: 80 y.o. MRN: [id number]  CC: Follow-up (3 month f/u)   HPI Henry [name] presents for CVA, HTN. Asking about BDNF. C/o cough  -- white sputum x1-2 month post-COVID. No SOB, no wheezing  CT in Oct 2021: Pulmonary parenchymal pattern of fibrosis can be seen with usual interstitial pneumonitis or nonspecific interstitial pneumonitis. 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 2. Borderline enlarged mediastinal lymph nodes be seen in the setting of interstitial lung disease. Difficult to definitively exclude a lymphoproliferative disorder.  Outpatient Medications Prior to Visit  Medication Sig Dispense Refill  . aspirin EC 81 MG tablet Take 1 tablet (81 mg total) by mouth daily. Swallow whole. 30 tablet 11  . atorvastatin (LIPITOR) 40 MG tablet Take 0.5 tablets (20 mg total) by mouth daily at 6 PM. (Patient taking differently: Take 20 mg by mouth every other day.) 30 tablet 11  . B Complex-Folic Acid (B COMPLEX PLUS) TABS Take 1 tablet by mouth daily. 100 tablet 3  . Cholecalciferol (VITAMIN D3) 50 MCG (2000 UT) capsule Take 1 capsule (2,000 Units total) by mouth daily. 100 capsule 3  . diltiazem (CARDIZEM) 60 MG tablet Take 1 tablet (60 mg total) by mouth 2 (two) times daily. Annual appt due in June must see provider for future refills 60 tablet 5  . donepezil (ARICEPT) 5 MG tablet Take 1 tablet (5 mg total) by mouth at bedtime. 30 tablet 1  . memantine (NAMENDA) 10 MG tablet Take 1 tablet (10 mg total) by mouth 2 (two) times daily. Annual appt due in June must see provider for future refills 60 tablet 5  . Misc Natural Products (PROSTATE HEALTH) CAPS Take 1 capsule by mouth daily.    . QUEtiapine (SEROQUEL) 25 MG tablet Take 0.5 tablets (12.5 mg total) by mouth at bedtime. Take 1/2 tablet in am and 2 tablets at night (Patient taking differently: Take 25 mg by mouth at bedtime. Take 1  tablet by mouth at bedtime) 90 tablet 3   No facility-administered medications prior to visit.    ROS: Review of Systems  Constitutional: Negative for appetite change, fatigue and unexpected weight change.  HENT: Negative for congestion, nosebleeds, sneezing, sore throat and trouble swallowing.   Eyes: Negative for itching and visual disturbance.  Respiratory: Negative for cough.   Cardiovascular: Negative for chest pain, palpitations and leg swelling.  Gastrointestinal: Negative for abdominal distention, blood in stool, diarrhea and nausea.  Genitourinary: Negative for frequency and hematuria.  Musculoskeletal: Positive for gait problem. Negative for back pain, joint swelling and neck pain.  Skin: Negative for rash.  Neurological: Negative for dizziness, tremors, speech difficulty and weakness.  Psychiatric/Behavioral: Positive for decreased concentration. Negative for agitation, dysphoric mood, sleep disturbance and suicidal ideas. The patient is not nervous/anxious.     Objective:  BP 130/78 (BP Location: Left Arm)   Pulse (!) 57   Temp 97.9 F (36.6 C) (Oral)   Ht 5\' 8"  (1.727 m)   Wt 164 lb (74.4 kg)   SpO2 97%   BMI 24.94 kg/m   BP Readings from Last 3 Encounters:  06/01/20 130/78  02/25/20 120/82  01/06/20 110/70    Wt Readings from Last 3 Encounters:  06/01/20 164 lb (74.4 kg)  02/25/20 159 lb 9.6 oz (72.4 kg)  01/06/20 154 lb (69.9 kg)    Physical Exam Constitutional:      General: He  is not in acute distress.    Appearance: He is well-developed.     Comments: NAD  Eyes:     Conjunctiva/sclera: Conjunctivae normal.     Pupils: Pupils are equal, round, and reactive to light.  Neck:     Thyroid: No thyromegaly.     Vascular: No JVD.  Cardiovascular:     Rate and Rhythm: Normal rate and regular rhythm.     Heart sounds: Normal heart sounds. No murmur heard. No friction rub. No gallop.   Pulmonary:     Effort: Pulmonary effort is normal. No respiratory  distress.     Breath sounds: Normal breath sounds. No wheezing or rales.  Chest:     Chest wall: No tenderness.  Abdominal:     General: Bowel sounds are normal. There is no distension.     Palpations: Abdomen is soft. There is no mass.     Tenderness: There is no abdominal tenderness. There is no guarding or rebound.  Musculoskeletal:        General: No tenderness. Normal range of motion.     Cervical back: Normal range of motion.  Lymphadenopathy:     Cervical: No cervical adenopathy.  Skin:    General: Skin is warm and dry.     Findings: No rash.  Neurological:     Mental Status: He is alert and oriented to person, place, and time.     Cranial Nerves: No cranial nerve deficit.     Motor: No abnormal muscle tone.     Coordination: Coordination normal.     Gait: Gait normal.     Deep Tendon Reflexes: Reflexes are normal and symmetric.  Psychiatric:        Behavior: Behavior normal.        Thought Content: Thought content normal.        Judgment: Judgment normal.    L arm, leg are weaker; L knee would buckle   Lab Results  Component Value Date   WBC 5.9 02/25/2020   HGB 13.9 02/25/2020   HCT 40.5 02/25/2020   PLT 171.0 02/25/2020   GLUCOSE 101 (H) 02/25/2020   CHOL 147 08/26/2019   TRIG 180.0 (H) 08/26/2019   HDL 41.80 08/26/2019   LDLDIRECT 135.0 02/24/2017   LDLCALC 70 08/26/2019   ALT 16 02/25/2020   AST 19 02/25/2020   NA 140 02/25/2020   K 4.2 02/25/2020   CL 104 02/25/2020   CREATININE 0.98 02/25/2020   BUN 16 02/25/2020   CO2 29 02/25/2020   TSH 1.24 09/05/2019   PSA 1.75 02/24/2017   INR 1.2 04/17/2019   HGBA1C 6.6 (H) 05/29/2019    CT Chest Wo Contrast  Result Date: 12/11/2019 CLINICAL DATA:  Persistent cough, sometimes productive. History of COVID infection in February. EXAM: CT CHEST WITHOUT CONTRAST TECHNIQUE: Multidetector CT imaging of the chest was performed following the standard protocol without IV contrast. COMPARISON:  None. FINDINGS:  Cardiovascular: Atherosclerotic calcification of the aorta, aortic valve and coronary arteries. Distal transverse aorta is mildly ectatic, 3.1 cm. Heart is enlarged. No pericardial effusion. Mediastinum/Nodes: Mediastinal lymph nodes measure up to 11 mm in the low right paratracheal station. Hilar regions are difficult to evaluate without IV contrast. No axillary adenopathy. Esophagus is unremarkable. Lungs/Pleura: Biapical pleuroparenchymal scarring. Centrilobular and paraseptal emphysema. Peripheral subpleural reticular densities and ground-glass with traction bronchiectasis/bronchiolectasis. Difficult to differentiate paraseptal emphysema from honeycombing, especially in the left upper lobe. No pleural fluid. Minimal debris in the airway. Upper Abdomen: Visualized portions  of the liver, gallbladder, adrenal glands, right kidney, spleen, pancreas, stomach and bowel are grossly unremarkable. Musculoskeletal: Degenerative changes in the spine. No worrisome lytic or sclerotic lesions. IMPRESSION: 1. Pulmonary parenchymal pattern of fibrosis can be seen with usual interstitial pneumonitis or nonspecific interstitial pneumonitis. 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 2. Borderline enlarged mediastinal lymph nodes be seen in the setting of interstitial lung disease. Difficult to definitively exclude a lymphoproliferative disorder. 3. Aortic atherosclerosis (ICD10-I70.0). Coronary artery calcification. 4.  Emphysema (ICD10-J43.9). Electronically Signed   By: Lorin Picket M.D.   On: 12/11/2019 08:04    Assessment & Plan:    Walker Kehr, MD

## 2020-06-01 NOTE — Assessment & Plan Note (Signed)
Declined Rx in the past

## 2020-06-01 NOTE — Assessment & Plan Note (Signed)
Post- COVID  CT in Oct 2021: Pulmonary parenchymal pattern of fibrosis can be seen with usual interstitial pneumonitis or nonspecific interstitial pneumonitis. 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 2. Borderline enlarged mediastinal lymph nodes be seen in the setting of interstitial lung disease. Difficult to definitively exclude a lymphoproliferative disorder.  Pulm ref declined, MDI offered - trelegy

## 2020-06-01 NOTE — Assessment & Plan Note (Signed)
On Lipitor 

## 2020-06-01 NOTE — Patient Instructions (Addendum)
How to Increase BDNF: 10 Ways to Rescue Your Brain What Is Brain Derived Neurotrophic Factor (BDNF)? The Science Behind BDNF How to Increase BDNF: 10 Ways to Raise Your BDNF Levels Why Strong Coffe Is Like Winning the BDNF Lottery Conclusion: Higher BDNF for Better Brain Function    Have you heard of BDNF? Relax, you don't need to choose a safe word.  Actually, BDNF is more like aftercare for your brain, helping your neurons heal and form new connections despite the pounding they take during your daily grind.  Read on to learn why BDNF is incredibly important for brain health, how to immediately increase your BDNF levels, and the easiest way of all to boost BDNF on a daily basis.  What Is Brain Derived Neurotrophic Factor (BDNF)? Brain-derived neurotrophic factor, BDNF for short, is a growth factor and peptide (long-chain protein).  The name comes from the [place] neuro for "nerve" and trophis "pertaining to food, nourishment, or growth."   What does BDNF do?  In a nutshell, BDNF supports the survival of neurons and brain cells, promotes synaptic connections between neurons, and is essential for learning and long-term memory storage[1][2][3].   For adults, BDNF also plays a vital role in neurogenesis (the creation of new neurons from stem cells)[4].  BDNF also occurs in the kidneys, blood plasma, and saliva, but its most important functions take place in the brain and central nervous system[5][6].  The Science Behind BDNF  What is the role of BDNF in the brain? Here's what cutting-edge science tells [place] about BDNF and its role in brain health and more.  Check out this list of fast facts:  Aging leads to lower levels of BDNF, which appear to cause shrinkage of gray matter and reductions in the number of synapses, making learning and forming memories harder[7].  Antidepressants may work by increasing BDNF[8][9]. Depression and anxiety correlate with lower BDNF levels, but antidepressants  may increase BDNF expression and reverse shrinkage of the hippocampus[9][10]. Cannabinoids like THC increase BDNF levels in people who don't smoke cannabis regularly, but not in chronic smokers, which could be why weed can wreck your memory (sorry, stoners)[11]. People with low BDNF levels may be more likely to have drinking problems, according to a 2019 study (Gorka et al.) that found associations between low BDNF, anxiety, and binge drinking. Alzheimer's and dementia patients have severely low BDNF[13][14]. Some scientists think that boosting BDNF levels could help preserve their brain function[15]. Other evidence suggests the higher your BDNF, the lower your risk of Alzheimer's and dementia[16].  To sum up, BDNF plays active roles in the hippocampus, prefrontal cortex, and other brain areas[17].  As these areas are associated with memory and cognition, BDNF appears to be critical for higher brain functions as well as maintaining the health of brain cells.  The BDNF Gene Like most proteins in your body, BDNF is encoded by a gene.   The BDNF gene provides your cells instructions for making BDNF[18].  In some people (about 30% of Europeans), a mutation known as [id number] or the Val66Met polymorphism results in lower BDNF levels[7].   People with this mutation have structural differences in their hippocampus, prefrontal cortex, parahippocampal gyrus, and amygdala[7].   They also tend to perform poorly on verbal tasks, working memory tests, and spatial tasks[7].  A 2011 study of middle-aged pilots also found Val66Met carriers experienced more skill decline and age-related decreases in hippocampal volume (the area of the brain associated with memory) compared to people without the mutation[19].  Should you get tested for Val66Met?  That's up to you. But it's probably not worth it to turn your DNA over to a big corporation just to find out your memory is shit.  Besides, some evidence suggests  Val66Met carries can compensate in other ways for their shortcomings[7]  Bottom line: regardless of which BDNF gene you carry, prioritize taking good care of your brain and doing everything possible to increase BDNF levels.  How to Increase BDNF: 10 Ways to Raise Your BDNF Levels  Now that you understand how valuable and essential BDNF is, you're probably wondering: "How can I increase my BDNF?"  We've dug deep into the peer-reviewed research. Here's everything you need to do to keep your BDNF levels high for life.  1. CONTROL STRESS AND INFLAMMATION LEVELS Let's begin with the basics. Chronic stress and inflammation are terrible for your overall health, and BDNF is no exception.  Before you get too fancy, start here:  Avoid processed foods and sugar[20]. Manage stress[21]. Get plenty of sleep[22] Obesity, insulin resistance, and type 2 diabetes are all associated with lower BDNF levels[23][24]. If you're dealing with these issues, your very first step should be to cut out crap foods.   Even if you aren't overweight or insulin resistant, sugary processed foods may still tank your BDNF[20].  Yoga and meditation are effective ways to manage stress and increase BDNF levels, according to a 2017 paper (Cahn et al.)[21].  And getting enough sleep is essential for a healthy brain and healthy BDNF levels[22]. But when you're stressed, your need for sleep may be even higher[23].  2. EXERCISE REGULARLY  Studies suggest that intense aerobic exercise is the most effective physical activity to increase your BDNF levels[25][26].  Although exercise increases BDNF immediately, the results appear to be better when you exercise regularly[25].  A 2018 mini-review found that high-intensity interval training (HIIT) may also be effective at increasing BDNF, but current data is very limited[27].  And a 2017 study discovered that resistance training can also elevate BDNF levels[28].   The researchers found  that hypertrophy (bodybuilding-style) training to muscular fatigue raised BDNF more than pure strength training[28].  But if you're not already in the habit of exercising, one of the best ways to increase your BDNF levels may be to simply move more.  A 2018 study in Neuroscience found that compared to sedentary people, physically active people had higher BDNF levels[29].   How does exercise increase BDNF?  So far, very few researchers have tried to explain how exactly exercise increases circulating BDNF.  According to a 2018 review Volanda Napoleon et al.), BDNF may be released during exercise from the brain, skeletal muscle, peripheral blood mononuclear cells (PBMCs), vascular endothelial cells, and platelets[30].   The authors suggest that shear stress from increased blood flow during exercise causes many of these cells to release more stored BDNF than they would otherwise[30].   3. PRIORITIZE YOUR SOCIAL CONNECTIONS Social isolation and loneliness raise the risk of depression, substance abuse, and other mental health issues[31][32].  Not coincidentally, they also appear to lower your BDNF levels[33].  For a healthy brain, do your best to maintain solid connections with your family and friends.  4. BREATH FRESH AIR AND GET NAKED IN THE SUN Fresh air and sun? Really? Yep.   Clichd advice often turns out to be true. The question is, are you following it or not?   Here's how important air quality is for BDNF: multiple studies have found that unlike people  who exercise in clean air, those who exercise in air pollution (such as jogging or cycling near heavy traffic) don't experience the increase in BDNF levels that would otherwise accompany physical activity[34][35][36].  Along with exercising away from pollution, you can also make sure to open a window as often as possible. If you're somewhere with poor air quality, use an air filter at home or in the office.  A 2012 study of over 2,800 people  found that seasonal variations in sunlight affected BDNF levels, and ambient sun exposure also correlated with BDNF[37].   Now that you know sun affects BDNF, you can raise your BDNF levels by going outside regularly and getting sun on your skin.  5. DRINK COFFEE AND TAKE COFFEE BERRY SUPPLEMENTS Plants that are rich in polyphenols and other antioxidants are beneficial for your BDNF levels[38].  One such plant is the [place] or coffee plant[38].   The coffee bean is rich in chlorogenic acid, caffeic acid, and caffeine, all of which may increase BDNF expression[38].  And the coffee fruit, also known as coffee cherry or cascara, may be even more effective at increasing BDNF.  A 2013 clinical trial (Reyes-Izquierdo et al.) found that 100 milligrams of cascara extract increased BDNF levels by 143%[39].  6. CONSUME A HIGH-PROTEIN DIET  Eating plenty of high-quality protein can help your brain stay healthy as you age[40].  A mouse study also found that mice fed a high-protein diet had higher BDNF levels, especially when combined with exercise[42].  One reason is that amino acids, the building blocks of proteins, are necessary for the production of neurotransmitters--including BDNF[41].  7. RESTRICT CARBOHYDRATE INTAKE (SOMETIMES) Carbs aren't always bad, but there are benefits to going low-carb sometimes.  Beta-hydroxybutyrate, a ketone your liver produces when you eat a very-low-carb diet or ketogenic diet, appears to increase brain BDNF levels[43].  A 2019 trial found that people with metabolic syndrome (severe insulin resistance) had higher BDNF levels after eating a paleo-keto diet with less than 50 grams of carbs per day and doing HIIT for four weeks[24].  If you don't want to go keto all the time, you can try temporary carb restriction or cyclical keto for a BDNF boost.  8. FAST CORRECTLY  Does fasting increase BDNF? It depends on how you fast.  "Intermittent fasting" or  fasting every day by skipping breakfast probably won't do the trick.  Current research suggests that occasional, prolonged fast periods are a better choice for boosting BDNF.  For example, in one study, a 48-hour fast boosted skeletal muscle BDNF levels by a whopping 350%[44].  The reason may be ketone production[45]. When you go long enough without eating, your body goes into a state of ketosis.  Similar to carb restriction, ketosis and beta-hydroxybutyrate production could be one reason extended fasting windows can increase BDNF while shorter fasts don't do it as effectively[43]  9. EAT THESE BDNF FOODS As we already covered, polyphenol antioxidants are excellent for your BDNF levels[38].  Dark chocolate, blueberries, and extra-virgin olive oil are high-polyphenol foods that are proven to increase BDNF and support brain health[10][38][46][46].  Butyrate, a short-chain fatty acid found in butter also appears to raise BDNF[47].  Lastly, people in countries that eat more fish are less depressed, probably because omega-3s in fatty fish increase BDNF levels[48][49].  10. OTHER BDNF SUPPLEMENTS These supplements may support healthy BDNF levels:  Omega-3 supplements[49] Magnesium, especially magnesium threonate[50] Zinc[51] Niacin[52] Curcumin[53] Medium-chain triglyceride (MCT) oil, by increasing beta-hydroxybutyrate levels[43][54]. L-theanine, a calming  amino acid found in green tea[55] Resveratrol[56][57] The best use of supplements is to round out your BDNF regimen.   Unless you suspect your BDNF levels are low, or you have a deficiency in omega-3s, magnesium, or zinc, there's probably no need to prioritize supplementation.  WHY STRONG COFFEE IS LIKE WINNING THE BDNF LOTTERY The best way to build up your BDNF levels is to develop a daily routine around BDNF-boosting activities.   Stress management, exercise, and quality time with your friends and family are essential.  Along  with that, you can also use STRONG Coffee Daybreaker or Morning Fix every A.M. to support higher BDNF levels.  Strong Coffee contains six hand-picked ingredients proven to support BDNF expression:  Coffea arabica (coffee) beans[38] Caffeine[38] Cascara (coffee fruit) extract as Neurofactor[39]  L-theanine[55] MCT oil[43][54]   15 grams of collagen protein with added tryptophan[41][58][59]  Not only that, but our proprietary STRONGMind Blend is synergistic. All the ingredients work together to promote elevated BDNF throughout your brain and central nervous system.  CONCLUSION: HIGHER BDNF FOR BETTER BRAIN FUNCTION Regardless of your age, your BDNF levels have a huge effect on your mental abilities, mood, and overall brain health.   Aging and stress lower BDNF levels, but you can preserve your brain health by managing stress, exercising, and forming close relationships.  STRONG Coffee is an easy, delicious way to incorporate six proven BDNF boosters into your morning routine.  Our formula is unlike any other, and it not only supports brain health, but also makes you feel amazing.   A precise blend of 180 mg caffeine and 150 milligrams theanine means smooth, consistent energy with no crash, while the protein and MCTs keep your brain and body fueled and functioning optimally.   Want to discover what you've been missing out on by not drinking STRONG Coffee? For a limited time only, receive 15% off your first order. Please use code [place] at checkout.

## 2020-06-01 NOTE — Assessment & Plan Note (Signed)
CBC

## 2020-06-01 NOTE — Assessment & Plan Note (Addendum)
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

## 2020-06-01 NOTE — Addendum Note (Signed)
Addended by: Cresenciano Lick on: 06/01/2020 09:41 AM   Modules accepted: Orders

## 2020-06-22 DIAGNOSIS — I700 Atherosclerosis of aorta: Secondary | ICD-10-CM | POA: Insufficient documentation

## 2020-06-22 NOTE — Assessment & Plan Note (Signed)
Cont w/Lipitor 

## 2020-07-06 ENCOUNTER — Encounter: Payer: Self-pay | Admitting: Adult Health

## 2020-07-06 ENCOUNTER — Ambulatory Visit: Payer: Medicare HMO | Admitting: Adult Health

## 2020-07-06 VITALS — BP 114/70 | HR 68 | Ht 68.0 in | Wt 162.0 lb

## 2020-07-06 DIAGNOSIS — I639 Cerebral infarction, unspecified: Secondary | ICD-10-CM

## 2020-07-06 DIAGNOSIS — D6869 Other thrombophilia: Secondary | ICD-10-CM

## 2020-07-06 DIAGNOSIS — I69319 Unspecified symptoms and signs involving cognitive functions following cerebral infarction: Secondary | ICD-10-CM

## 2020-07-06 DIAGNOSIS — I69398 Other sequelae of cerebral infarction: Secondary | ICD-10-CM

## 2020-07-06 DIAGNOSIS — U071 COVID-19: Secondary | ICD-10-CM | POA: Diagnosis not present

## 2020-07-06 DIAGNOSIS — G8114 Spastic hemiplegia affecting left nondominant side: Secondary | ICD-10-CM | POA: Diagnosis not present

## 2020-07-06 DIAGNOSIS — R269 Unspecified abnormalities of gait and mobility: Secondary | ICD-10-CM

## 2020-07-06 MED ORDER — DONEPEZIL HCL 5 MG PO TABS: 5.0000 mg | ORAL_TABLET | Freq: Every day | ORAL | 0 refills | Status: DC

## 2020-07-06 NOTE — Progress Notes (Signed)
[facility] [address] [place]. [place] [address] [phone]       OFFICE FOLLOW UPVISIT NOTE  Mr. TYMEL [name] Date of Birth:  [birth year] Medical Record Number:  [id number]   Referring MD: Rosalin Hawking  Reason for Referral: Stroke  Chief Complaint  Patient presents with  . Follow-up    TR with spouse (wilma) Pt is stable on stroke and dementia standpoint, nothing has worsen. L side immobility      HPI:   Today, 07/06/2020, Mr. Henry [name] returns for 78-month follow-up with history of stroke and vascular dementia.  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  Denies new stroke/TIA symptoms Routine exercise at [employer] 3-4x/week as well as routine exercising at home.  Left arm spasticity greater than leg spasticity but does have occasional gait difficulty as left knee has been hyperextending.  Cognitive impairment relatively stable with MMSE today 26/30 (prior 27/30).  Remains on Namenda but wife is unsure if he has continued on Aricept.  Also remains on Seroquel 12.5mg  AM and 25mg  PM per PCP for prior behavioral concerns with paranoia with continued benefit and no reoccurrence.  Remains active maintaining ADLs and majority of IADLs independently.  Compliant on aspirin and atorvastatin without associated side effects Blood pressure today 114/70 - monitors at home which has been stable  No further concerns at this time   History provided for reference purposes only Update 01/06/2020 JM: Mr. Henry [name] returns for 78-month follow-up with history of stroke and vascular dementia with behaviors.  He has been doing well since prior visit with improvement of paranoia and behaviors.  Cognition stable since prior visit with possible improvement.  Dr. Leonie Man initiated Namenda at prior visit which she is continued on tolerating well without side effects.  He also remains on Seroquel and Aricept per PCP.  Residual stroke  deficit of spastic left hemiparesis stable without worsening.  Denies new stroke/TIA symptoms.  Remains on Eliquis for secondary stroke prevention and DVT without bleeding or bruising.  Lower extremity Dopplers 10/17/2019 showed resolution of prior DVT.  Remains on atorvastatin 20 mg daily tolerating well without side effects.  Blood pressure today 110/70.  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  No further concerns at this time.  Update 10/14/2019 Dr. Leonie Man : He returns for follow-up after last visit 2 months ago.  Is accompanied by his wife.  Wife is concerned about patient's behavior which has changed.  Is having some bad thoughts and states times feels like getting himself.  He is also become paranoid and feels that wife is leaving him.  He has seen his primary care physician who recently started him on Seroquel and is currently taking 12 point 5 in the morning and 50 at night which seems to be be helping as is sleeping better.  He still have intermittent agitation, verbal abuse no physical threats as well as confusion initially responded well to Seroquel but it was stopped but seems to be doing better with recent dose change.  He was also started on Aricept l and is taking 5 daily which she seems to be tolerating well but does not seem to have helped him much.  LDL cholesterol in 08/26/2019 was 70 mg percent but triglycerides were elevated at 180.  Lab work on 09/05/2019 showed vitamin B12 TSH and vitamin D to be normal.  CT scan of the head was done on 09/10/2019 due to a fall  which showed no acute abnormalities.  He is currently doing outpatient physical occupational therapy and is ambulating well.  Walks with a cane.  He has had no recurrent stroke or TIA symptoms.  He remains on Eliquis which is tolerating well without bruising or bleeding.  At last visit had ordered an outpatient carotid ultrasound which for unclear reason has not yet been done.  Initial visit 08/20/2019 Dr. Leonie Man:  Mr. Henry [name] is a 80 year old Caucasian male seen today for initial office consultation visit.  History is obtained from the patient, review of electronic medical records and have personally reviewed imaging films in PACS.  Patient has past medical history of hypertension, polycythemia, low back pain and recent Covid infection who presented to [facility] on 04/17/2019 as a code stroke for sudden onset of left-sided weakness and neglect.  On arrival he had NIH stroke scale of 16.  CT scan of the head showed early acute ischemic changes involving the right MCA territory and is aspect score was 4.  CT perfusion however showed only 10 mm core infarct with 147 cc of penumbra with a mismatch of 137 mL.  Due to this mismatch patient was taken for emergent MRI scan which showed a large area of restricted diffusion however with corresponding T2 flair mismatch hence after discussion with patient and daughter as well as interventional neuroradiologist it was decided to take the patient for mechanical thrombectomy which was performed successfully with TICI 2 c reveasularization by Dr. Estanislado Pandy.  He was kept in the intensive care unit for blood pressure was tightly controlled.  Follow-up scan showed large right MCA infarct with some petechial hemorrhage with local mass-effect.  2D echo showed ejection fraction 55 to 60% without cardiac source of embolism.  Lower extremity venous Doppler showed DVT in the left gastrocnemius vein.  Transcranial Doppler study was negative for PFO.  LDL cholesterol was 53 mg percent hemoglobin A1c was 6.8.  Patient was transferred to skilled nursing facility for rehab and started on Eliquis for his DVT.  Patient states she is done well he has finished rehabilitation and skilled nursing facility and is presently living at home.  He is finished home physical and occupational therapy and plans to start outpatient therapy this week.  He is able to ambulate with a cane he states his balance is  good is an only one minor fall.  He is on Eliquis which is tolerating well without bleeding or bruising.  His blood pressures well controlled today it is 126/74.  He is tolerating Lipitor well without muscle aches and pains but he states he is constipated and feels this may be related to Lipitor.    ROS:   14 system review of systems is positive for those listed in HPI and all other systems negative  PMH:  Past Medical History:  Diagnosis Date  . Colon polyps   . COVID-19   . Elevated MCV 2011  . GERD (gastroesophageal reflux disease) 2012  . 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 Osteoarthritis   . Polycythemia 2011    Social History:  Social History   Socioeconomic History  . Marital status: Married    Spouse name: [name]  . Number of children: 3  . Years of education: 24  . Highest education level: Not on file  Occupational History  . Occupation: Retired    Fish farm manager: RETIRED  Tobacco Use  . Smoking status: Former Smoker  Packs/day: 1.00    Types: Cigarettes    Quit date: 05/24/1976    Years since quitting: 44.1  . Smokeless tobacco: Never Used  Vaping Use  . Vaping Use: Never used  Substance and Sexual Activity  . Alcohol use: No  . Drug use: No  . Sexual activity: Not Currently  Other Topics Concern  . Not on file  Social History Narrative  . Not on file   Social Determinants of Health   Financial Resource Strain: Not on file  Food Insecurity: Not on file  Transportation Needs: Not on file  Physical Activity: Not on file  Stress: Not on file  Social Connections: Not on file  Intimate Partner Violence: Not on file    Medications:   Current Outpatient Medications on File Prior to Visit  Medication Sig Dispense Refill  . aspirin EC 81 MG tablet Take 1 tablet (81 mg total) by mouth daily. Swallow whole. 30 tablet 11  . atorvastatin (LIPITOR) 40 MG tablet Take 0.5 tablets (20 mg total) by mouth daily at  6 PM. (Patient taking differently: Take 20 mg by mouth every other day.) 30 tablet 11  . B Complex-Folic Acid (B COMPLEX PLUS) TABS Take 1 tablet by mouth daily. 100 tablet 3  . Cholecalciferol (VITAMIN D3) 50 MCG (2000 UT) capsule Take 1 capsule (2,000 Units total) by mouth daily. 100 capsule 3  . diltiazem (CARDIZEM) 60 MG tablet Take 1 tablet (60 mg total) by mouth 2 (two) times daily. Annual appt due in June must see provider for future refills 60 tablet 5  . memantine (NAMENDA) 10 MG tablet Take 1 tablet (10 mg total) by mouth 2 (two) times daily. Annual appt due in June must see provider for future refills 60 tablet 5  . Misc Natural Products (PROSTATE HEALTH) CAPS Take 1 capsule by mouth daily.    . QUEtiapine (SEROQUEL) 25 MG tablet Take 0.5 tablets (12.5 mg total) by mouth at bedtime. Take 1/2 tablet in am and 2 tablets at night (Patient taking differently: Take 25 mg by mouth at bedtime. Take 1 tablet by mouth at bedtime) 90 tablet 3  . donepezil (ARICEPT) 5 MG tablet Take 1 tablet (5 mg total) by mouth at bedtime. (Patient not taking: Reported on 07/06/2020) 30 tablet 1   No current facility-administered medications on file prior to visit.    Allergies:   Allergies  Allergen Reactions  . No Known Allergies     Physical Exam Today's Vitals   07/06/20 0857  BP: 114/70  Pulse: 68  Weight: 162 lb (73.5 kg)  Height: 5\' 8"  (1.727 m)   Body mass index is 24.63 kg/m.   General: well developed, well nourished pleasant elderly Caucasian male, seated, in no evident distress Head: head normocephalic and atraumatic.   Neck: supple with no carotid or supraclavicular bruits Cardiovascular: regular rate and rhythm, soft ejection murmur. Musculoskeletal: no deformity Skin:  no rash/petichiae Vascular:  Normal pulses all extremities  Neurologic Exam Mental Status: Awake and fully alert. oriented to place and time. Recent memory impaired and remote memory intact. Attention span,  concentration and fund of knowledge mostly appropriate with wife providing some information.  Mood and affect appropriate.  MMSE - Mini Mental State Exam 07/06/2020 01/06/2020 10/14/2019  Orientation to time 3 3 1   Orientation to Place 5 5 3   Registration 3 3 3   Attention/ Calculation 5 5 5   Recall 2 2 1   Language- name 2 objects 2 2 2   Language-  repeat 1 1 1   Language- follow 3 step command 3 3 3   Language- read & follow direction 1 1 1   Write a sentence 1 1 0  Write a sentence-comments - - no subject  Copy design 0 1 0  Total score 26 27 20    Cranial Nerves: Pupils equal, briskly reactive to light. Extraocular movements full without nystagmus. 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 Hearing diminished bilaterally. Facial sensation intact.  Mild left lower facial asymmetry., tongue, palate moves normally and symmetrically.  Motor:  LUE: 4/5 proximal and 2/5 hand with increased tone throughout LLE: 5/5 proximal and 4/5 ADF Sensory.: intact to touch , pinprick , position and vibratory sensation.  Coordination: Rapid alternating movements normal in all extremities except decreased left hand. Finger-to-nose performed accurately RUE and heel-to-shin performed accurately bilaterally. Gait and Station: Arises from chair without difficulty. Stance is normal. Gait abnormality with consistent left knee hyperflexion and decreased stride length without use of assistive device.  Tandem walk and heel toe not attempted. Reflexes: 1+ RUE and RLE and brisker on the left UE>LE. Toes downgoing.       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  Diagnosed with acute DVT in the left leg and treated with Eliquis.  Repeat lower extremity Dopplers showed resolution of prior DVT therefore Eliquis discontinued    R  MCA stroke -Residual left spastic hemiparesis and visual impairment.  Improvement of left-sided weakness but continued LUE spasticity greatly interfering with daytime activity and potential further recovery -referral placed to physical medicine and rehab for further evaluation with possible use of Botox.  Hesitant to trial muscle relaxant as spasticity localized mainly to LUE and potential side effects may outweigh benefit.  Also advised to discuss further recommendations of use of knee brace or possible AFO brace for left knee hyperextension -Continue aspirin 81 mg daily and atorvastatin for secondary stroke prevention -Discussed secondary stroke prevention measures and importance of close PCP follow-up for aggressive stroke risk factor management including HTN with BP goal<130/90 and HLD with LDL goal<70.   Mild vascular cognitive impairment with behaviors -Remains stable with MMSE today 26/30 (prior 27/30) -No additional behavioral concerns -Continue Seroquel and Aricept as prescribed by PCP as well as continued use of Namenda -refill for Aricept provided today per request but request future refills provided by PCP -Discussed importance of managing stroke risk factors as well as memory exercises such as crossword puzzles, word search, sudoku, card games and reading  Hypercoagulable state in setting of COVID, resolved -Repeat D-dimer and CRP 01/2020 WNL -repeat LE doppler 10/2019 resolution of prior acute DVT -No indication for prolonged anticoagulation - d/c'd Eliquis 01/2020    Follow-up in 6 months or call earlier if needed  CC:  [facility] provider: Dr. Hilton Cork, [name], MD     Frann Rider, [facility]  [facility] [address] [place], [place] [address]  Phone [phone] Fax [phone] Note: This document was prepared with digital dictation and possible smart phrase technology. Any transcriptional errors that result from this process are  unintentional.

## 2020-07-06 NOTE — Progress Notes (Signed)
I agree with the above plan 

## 2020-07-06 NOTE — Patient Instructions (Addendum)
Referral to physical medicine and rehab for potential benefit of Botox for spasticity as well as further recommendations of your left knee hyperextending such as with use of the brace  Continue namenda, and aricept for memory -request ongoing refills provided by PCP  Continue aspirin 81 mg daily  and atorvastatin  for secondary stroke prevention  Continue to follow up with PCP regarding cholesterol and blood pressure management  Maintain strict control of hypertension with blood pressure goal below 130/90 and cholesterol with LDL cholesterol (bad cholesterol) goal below 70 mg/dL.      Followup in the future with me in 6 months or call earlier if needed      Thank you for coming to see [place] at [facility]. I hope we have been able to provide you high quality care today.  You may receive a patient satisfaction survey over the next few weeks. We would appreciate your feedback and comments so that we may continue to improve ourselves and the health of our patients.

## 2020-07-14 ENCOUNTER — Encounter: Payer: Self-pay | Admitting: Physical Medicine & Rehabilitation

## 2020-07-17 ENCOUNTER — Telehealth: Payer: Self-pay | Admitting: Internal Medicine

## 2020-07-17 NOTE — Progress Notes (Signed)
  Chronic Care Management   Note  07/17/2020 Name: Henry [name] MRN: [id number] DOB: 1940-10-30  Henry [name] is a 80 y.o. year old male who is a primary care patient of [name], [name], MD. I reached out to Henry [name] by phone today in response to a referral sent by Henry [name] PCP, [name], [name], MD.   Henry [name] was given information about Chronic Care Management services today including:  1. CCM service includes personalized support from designated clinical staff supervised by his physician, including individualized plan of care and coordination with other care providers 2. 24/7 contact phone numbers for assistance for urgent and routine care needs. 3. Service will only be billed when office clinical staff spend 20 minutes or more in a month to coordinate care. 4. Only one practitioner may furnish and bill the service in a calendar month. 5. The patient may stop CCM services at any time (effective at the end of the month) by phone call to the office staff.   Patient wishes to consider information provided and/or speak with a member of the care team before deciding about enrollment in care management services.   Follow up plan:   [place]

## 2020-08-17 ENCOUNTER — Other Ambulatory Visit: Payer: Self-pay | Admitting: Internal Medicine

## 2020-08-18 ENCOUNTER — Other Ambulatory Visit: Payer: Self-pay

## 2020-08-18 ENCOUNTER — Encounter: Payer: Medicare HMO | Attending: Physical Medicine & Rehabilitation | Admitting: Physical Medicine & Rehabilitation

## 2020-08-18 ENCOUNTER — Encounter: Payer: Self-pay | Admitting: Physical Medicine & Rehabilitation

## 2020-08-18 VITALS — BP 131/84 | HR 63 | Temp 98.1°F | Ht 66.25 in | Wt 159.6 lb

## 2020-08-18 DIAGNOSIS — I69354 Hemiplegia and hemiparesis following cerebral infarction affecting left non-dominant side: Secondary | ICD-10-CM | POA: Diagnosis not present

## 2020-08-18 DIAGNOSIS — G8110 Spastic hemiplegia affecting unspecified side: Secondary | ICD-10-CM | POA: Diagnosis not present

## 2020-08-18 NOTE — Progress Notes (Signed)
Subjective:    Patient ID: Henry [name], male    DOB: May 18, 1940, 80 y.o.   MRN: [id number]  HPI RIght MCA infarct Feb 2021 related to hypercoagulable state from COVID-19 infection with residual left hemiparesis.  The patient completed his rehabilitation.  He has been followed by neurology.  Last neurology visit was on 07/06/2020, patient referred to evaluate spasticity post stroke The patient is modified independent with all self-care and mobility.  The patient has resumed working out at [employer].  He is here with his wife.  The patient denies any problems with fisting on the left side he is able to cut his nails he is able to wash his hand.  He has some difficulty extending his elbow.  He also feels a bit tight at the shoulder. With his lower limb he does not notice any spasms or shaking.  During ambulation he does note that his knee tends to "buckle backwards"    Pain Inventory Average Pain 1 Pain Right Now 0 My pain is intermittent and dull  LOCATION OF PAIN  leg, arm  BOWEL Number of stools per week: 3 Oral laxative use No  Type of laxative na Enema or suppository use No  History of colostomy No  Incontinent No   BLADDER Normal In and out cath, frequency na Able to self cath  na Bladder incontinence No  Frequent urination Yes  Leakage with coughing No  Difficulty starting stream No  Incomplete bladder emptying No    Mobility walk without assistance how many minutes can you walk? 60 ability to climb steps?  yes do you drive?  yes  Function retired I need assistance with the following:  dressing  Neuro/Psych confusion depression  Prior Studies New pt  Physicians involved in your care New pt   Family History  Problem Relation Age of Onset   Colon cancer Sister 78   Breast cancer Other        1st degree relative   Stroke Neg Hx    Esophageal cancer Neg Hx    Pancreatic cancer Neg Hx    Prostate cancer Neg Hx    Rectal cancer Neg Hx     Stomach cancer Neg Hx    Social History   Socioeconomic History   Marital status: Married    Spouse name: Wilma   Number of children: 3   Years of education: 12   Highest education level: Not on file  Occupational History   Occupation: Retired    Fish farm manager: RETIRED  Tobacco Use   Smoking status: Former    Packs/day: 1.00    Pack years: 0.00    Types: Cigarettes    Quit date: 05/24/1976    Years since quitting: 44.2   Smokeless tobacco: Never  Vaping Use   Vaping Use: Never used  Substance and Sexual Activity   Alcohol use: No   Drug use: No   Sexual activity: Not Currently  Other Topics Concern   Not on file  Social History Narrative   Not on file   Social Determinants of Health   Financial Resource Strain: Not on file  Food Insecurity: Not on file  Transportation Needs: Not on file  Physical Activity: Not on file  Stress: Not on file  Social Connections: Not on file   Past Surgical History:  Procedure Laterality Date   APPENDECTOMY     COLONOSCOPY     HERNIA REPAIR     march 2019   [place]  Left 2015   IR CT HEAD LTD  04/17/2019   IR PERCUTANEOUS ART THROMBECTOMY/INFUSION INTRACRANIAL INC DIAG ANGIO  04/17/2019   LUMBAR LAMINECTOMY/DECOMPRESSION MICRODISCECTOMY N/A 05/20/2016   Procedure: Lumbar Three-Four,Lunmbar Four-Five,Lumbar Five-Sacral One Laminectomy/Foraminotomy;  Surgeon: Kristeen Miss, MD;  Location: [place];  Service: Neurosurgery;  Laterality: N/A;   POLYPECTOMY     RADIOLOGY WITH ANESTHESIA N/A 04/17/2019   Procedure: IR WITH ANESTHESIA;  Surgeon: Luanne Bras, MD;  Location: [place];  Service: Radiology;  Laterality: N/A;   Past Medical History:  Diagnosis Date   Colon polyps    COVID-19    Elevated MCV 2011   GERD (gastroesophageal reflux disease) 2012   LBP (low back pain)    MRI L spine 06/2007: L L4 stenosis, 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   Osteoarthritis    Polycythemia 2011   BP 131/84 (BP Location: Right Arm, Patient  Position: Sitting, Cuff Size: Normal)   Pulse 63   Temp 98.1 F (36.7 C) (Oral)   Ht 5' 6.25" (1.683 m)   Wt 159 lb 9.6 oz (72.4 kg)   SpO2 97%   BMI 25.57 kg/m   Opioid Risk Score:   Fall Risk Score:  `1  Depression screen PHQ 2/9  Depression screen [facility] 2/9 08/18/2020 09/18/2019 02/26/2018 02/24/2017 04/14/2015  Decreased Interest 3 0 0 0 0  Down, Depressed, Hopeless 2 0 0 0 0  PHQ - 2 Score 5 0 0 0 0  Altered sleeping 0 - - - -  Tired, decreased energy 1 - - - -  Change in appetite 0 - - - -  Feeling bad or failure about yourself  1 - - - -  Trouble concentrating 0 - - - -  Moving slowly or fidgety/restless 1 - - - -  Suicidal thoughts 0 - - - -  PHQ-9 Score 8 - - - -  Difficult doing work/chores Somewhat difficult - - - -     Review of Systems  Genitourinary:  Positive for frequency.  Musculoskeletal:  Positive for gait problem.  Psychiatric/Behavioral:  Positive for confusion and dysphoric mood.   All other systems reviewed and are negative.     Objective:   Physical Exam Vitals and nursing note reviewed.  Constitutional:      Appearance: Normal appearance. He is normal weight.  Eyes:     Extraocular Movements: Extraocular movements intact.     Conjunctiva/sclera: Conjunctivae normal.     Pupils: Pupils are equal, round, and reactive to light.  Cardiovascular:     Heart sounds: Normal heart sounds. No murmur heard. Pulmonary:     Effort: Pulmonary effort is normal. No respiratory distress.     Breath sounds: Normal breath sounds.  Abdominal:     General: Abdomen is flat. Bowel sounds are normal. There is no distension.     Palpations: Abdomen is soft.  Skin:    General: Skin is warm and dry.  Neurological:     Mental Status: He is alert and oriented to person, place, and time.  Psychiatric:        Mood and Affect: Mood normal.        Behavior: Behavior normal.   Motor strength is 3 - at the left deltoid bicep tricep grip 4 - at the left knee extensor 4 -  at the left knee flexor 4 - left ankle dorsiflexor and plantar flexor. Tone Left upper extremity Pectoralis MAS 3 Biceps MAS 3 FDS MAS 3 FDP MAS 2 FCR MAS 3 FCU  MAS 1  Gait without assistive device there is hyperextension at the knee with every step.  No toe drag      Assessment & Plan:   Left spatic hemiparesis- we discussed in detail role of "Botox" to reduce muscle spasticity but not improve strength.  We discussed risk of further weakening muscles as well as average onset and duration of action Xeomin 200U Left pectoralis 100U Left biceps 50U Left FCR 25U Left FDS 25U  2.  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

## 2020-08-18 NOTE — Patient Instructions (Signed)
Please call if you'd like to schedule Botox It can help to reduce spasm in muscles but will not strengthen them

## 2020-08-31 ENCOUNTER — Ambulatory Visit (INDEPENDENT_AMBULATORY_CARE_PROVIDER_SITE_OTHER): Payer: Medicare HMO | Admitting: Internal Medicine

## 2020-08-31 ENCOUNTER — Encounter: Payer: Self-pay | Admitting: Internal Medicine

## 2020-08-31 ENCOUNTER — Other Ambulatory Visit: Payer: Self-pay

## 2020-08-31 DIAGNOSIS — F22 Delusional disorders: Secondary | ICD-10-CM | POA: Diagnosis not present

## 2020-08-31 DIAGNOSIS — I82402 Acute embolism and thrombosis of unspecified deep veins of left lower extremity: Secondary | ICD-10-CM | POA: Diagnosis not present

## 2020-08-31 DIAGNOSIS — Z8673 Personal history of transient ischemic attack (TIA), and cerebral infarction without residual deficits: Secondary | ICD-10-CM

## 2020-08-31 DIAGNOSIS — I10 Essential (primary) hypertension: Secondary | ICD-10-CM | POA: Diagnosis not present

## 2020-08-31 DIAGNOSIS — I6601 Occlusion and stenosis of right middle cerebral artery: Secondary | ICD-10-CM

## 2020-08-31 MED ORDER — DICLOFENAC SODIUM 2 % EX SOLN: 1.0000 "application " | Freq: Four times a day (QID) | CUTANEOUS | 5 refills | Status: DC | PRN

## 2020-08-31 NOTE — Assessment & Plan Note (Signed)
L hemiparesis - getting a brace for the L footdrop F/u w/dr Kirsteins Cont w/Eliquis

## 2020-08-31 NOTE — Addendum Note (Signed)
Addended by: Cassandria Anger on: 08/31/2020 12:12 PM   Modules accepted: Orders

## 2020-08-31 NOTE — Assessment & Plan Note (Signed)
Cont w/Seroquel

## 2020-08-31 NOTE — Assessment & Plan Note (Signed)
On Diltiazem

## 2020-08-31 NOTE — Assessment & Plan Note (Signed)
L hemiparesis Cont w/Eliquis

## 2020-08-31 NOTE — Assessment & Plan Note (Signed)
Cont w/Eliquis 

## 2020-08-31 NOTE — Progress Notes (Signed)
Subjective:  Patient ID: Henry [name], male    DOB: 1940-12-05  Age: 80 y.o. MRN: [id number]  CC: Follow-up (3 month f/u)   HPI DELNO BLAISDELL presents for L leg weakness - needs a brace C/o falling, gait issues H/o CVA. F/u HTN  Outpatient Medications Prior to Visit  Medication Sig Dispense Refill   aspirin EC 81 MG tablet Take 1 tablet (81 mg total) by mouth daily. Swallow whole. 30 tablet 11   atorvastatin (LIPITOR) 40 MG tablet Take 0.5 tablets (20 mg total) by mouth daily at 6 PM. (Patient taking differently: Take 20 mg by mouth every other day.) 30 tablet 11   B Complex-Folic Acid (B COMPLEX PLUS) TABS Take 1 tablet by mouth daily. 100 tablet 3   Cholecalciferol (VITAMIN D3) 50 MCG (2000 UT) capsule Take 1 capsule (2,000 Units total) by mouth daily. 100 capsule 3   diltiazem (CARDIZEM) 60 MG tablet Take 1 tablet (60 mg total) by mouth 2 (two) times daily. Annual appt due in June must see provider for future refills 60 tablet 5   donepezil (ARICEPT) 5 MG tablet Take 1 tablet (5 mg total) by mouth at bedtime. 90 tablet 0   memantine (NAMENDA) 10 MG tablet Take 1 tablet (10 mg total) by mouth 2 (two) times daily. Annual appt due in June must see provider for future refills 60 tablet 5   Misc Natural Products (PROSTATE HEALTH) CAPS Take 1 capsule by mouth daily.     QUEtiapine (SEROQUEL) 25 MG tablet TAKE 1/2 TABLET BY MOUTH IN THE MORNING AND TAKE 2 TABLETS AT NIGHT 90 tablet 3   No facility-administered medications prior to visit.    ROS: Review of Systems  Constitutional:  Negative for appetite change, fatigue and unexpected weight change.  HENT:  Negative for congestion, nosebleeds, sneezing, sore throat and trouble swallowing.   Eyes:  Negative for itching and visual disturbance.  Respiratory:  Negative for cough.   Cardiovascular:  Negative for chest pain, palpitations and leg swelling.  Gastrointestinal:  Negative for abdominal distention, blood in stool, diarrhea and  nausea.  Genitourinary:  Negative for frequency and hematuria.  Musculoskeletal:  Positive for arthralgias and gait problem. Negative for back pain, joint swelling and neck pain.  Skin:  Negative for rash.  Neurological:  Positive for weakness. Negative for dizziness, tremors and speech difficulty.  Psychiatric/Behavioral:  Negative for agitation, dysphoric mood, sleep disturbance and suicidal ideas. The patient is nervous/anxious.    Objective:  BP 122/70 (BP Location: Right Arm)   Pulse 64   Temp 98.3 F (36.8 C) (Oral)   Ht 5' 6.25" (1.683 m)   Wt 163 lb 3.2 oz (74 kg)   SpO2 97%   BMI 26.14 kg/m   BP Readings from Last 3 Encounters:  08/31/20 122/70  08/18/20 131/84  07/06/20 114/70    Wt Readings from Last 3 Encounters:  08/31/20 163 lb 3.2 oz (74 kg)  08/18/20 159 lb 9.6 oz (72.4 kg)  07/06/20 162 lb (73.5 kg)    Physical Exam Constitutional:      General: He is not in acute distress.    Appearance: He is well-developed.     Comments: NAD  Eyes:     Conjunctiva/sclera: Conjunctivae normal.     Pupils: Pupils are equal, round, and reactive to light.  Neck:     Thyroid: No thyromegaly.     Vascular: No JVD.  Cardiovascular:     Rate and Rhythm: Normal rate  and regular rhythm.     Heart sounds: Normal heart sounds. No murmur heard.   No friction rub. No gallop.  Pulmonary:     Effort: Pulmonary effort is normal. No respiratory distress.     Breath sounds: Normal breath sounds. No wheezing or rales.  Chest:     Chest wall: No tenderness.  Abdominal:     General: Bowel sounds are normal. There is no distension.     Palpations: Abdomen is soft. There is no mass.     Tenderness: There is no abdominal tenderness. There is no guarding or rebound.  Musculoskeletal:        General: No tenderness. Normal range of motion.     Cervical back: Normal range of motion.  Lymphadenopathy:     Cervical: No cervical adenopathy.  Skin:    General: Skin is warm and dry.      Findings: No rash.  Neurological:     Mental Status: He is alert and oriented to person, place, and time.     Cranial Nerves: No cranial nerve deficit.     Motor: Weakness present. No abnormal muscle tone.     Coordination: Coordination normal.     Gait: Gait abnormal.     Deep Tendon Reflexes: Reflexes are normal and symmetric.  Psychiatric:        Behavior: Behavior normal.        Thought Content: Thought content normal.        Judgment: Judgment normal.   L hand is weak L foot drop Lab Results  Component Value Date   WBC 4.9 06/01/2020   HGB 13.2 06/01/2020   HCT 38.0 (L) 06/01/2020   PLT 152.0 06/01/2020   GLUCOSE 113 (H) 06/01/2020   CHOL 147 08/26/2019   TRIG 180.0 (H) 08/26/2019   HDL 41.80 08/26/2019   LDLDIRECT 135.0 02/24/2017   LDLCALC 70 08/26/2019   ALT 19 06/01/2020   AST 18 06/01/2020   NA 141 06/01/2020   K 4.1 06/01/2020   CL 104 06/01/2020   CREATININE 0.98 06/01/2020   BUN 15 06/01/2020   CO2 31 06/01/2020   TSH 1.24 09/05/2019   PSA 1.75 02/24/2017   INR 1.2 04/17/2019   HGBA1C 6.6 (H) 05/29/2019    CT Chest Wo Contrast  Result Date: 12/11/2019 CLINICAL DATA:  Persistent cough, sometimes productive. History of COVID infection in February. EXAM: CT CHEST WITHOUT CONTRAST TECHNIQUE: Multidetector CT imaging of the chest was performed following the standard protocol without IV contrast. COMPARISON:  None. FINDINGS: Cardiovascular: Atherosclerotic calcification of the aorta, aortic valve and coronary arteries. Distal transverse aorta is mildly ectatic, 3.1 cm. Heart is enlarged. No pericardial effusion. Mediastinum/Nodes: Mediastinal lymph nodes measure up to 11 mm in the low right paratracheal station. Hilar regions are difficult to evaluate without IV contrast. No axillary adenopathy. Esophagus is unremarkable. Lungs/Pleura: Biapical pleuroparenchymal scarring. Centrilobular and paraseptal emphysema. Peripheral subpleural reticular densities and  ground-glass with traction bronchiectasis/bronchiolectasis. Difficult to differentiate paraseptal emphysema from honeycombing, especially in the left upper lobe. No pleural fluid. Minimal debris in the airway. Upper Abdomen: Visualized portions of the liver, gallbladder, adrenal glands, right kidney, spleen, pancreas, stomach and bowel are grossly unremarkable. Musculoskeletal: Degenerative changes in the spine. No worrisome lytic or sclerotic lesions. IMPRESSION: 1. Pulmonary parenchymal pattern of fibrosis can be seen with usual interstitial pneumonitis or nonspecific interstitial pneumonitis. 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 2. Borderline enlarged mediastinal lymph nodes be seen in the setting of  interstitial lung disease. Difficult to definitively exclude a lymphoproliferative disorder. 3. Aortic atherosclerosis (ICD10-I70.0). Coronary artery calcification. 4.  Emphysema (ICD10-J43.9). Electronically Signed   By: Lorin Picket M.D.   On: 12/11/2019 08:04    Assessment & Plan:     Walker Kehr, MD

## 2020-09-01 ENCOUNTER — Telehealth: Payer: Self-pay | Admitting: *Deleted

## 2020-09-01 NOTE — Telephone Encounter (Signed)
Rec'd PA for pt Diclofenac 2% sol completed via cover-my-meds w/ (Key: [id number]). Waiting on insurance decision.Marland KitchenJohny [name]

## 2020-09-02 NOTE — Telephone Encounter (Signed)
Rec'd PA back med was " DENIED" .It states [employer] follows Medicare rules. The Medicare rule in the prescription drug manual chapter 6, section 10.6 says off label use of drugs is not covered by the FDA.Marland KitchenJohny [name]

## 2020-09-03 NOTE — Telephone Encounter (Signed)
Noted.  They can use Voltaren gel instead.  It is over-the-counter.  Thanks

## 2020-09-03 NOTE — Telephone Encounter (Signed)
Called pt there was no answer LMOM w/MD response../lmb 

## 2020-09-21 ENCOUNTER — Other Ambulatory Visit: Payer: Self-pay | Admitting: Adult Health

## 2020-09-29 ENCOUNTER — Other Ambulatory Visit: Payer: Self-pay

## 2020-09-29 ENCOUNTER — Encounter: Payer: Medicare HMO | Attending: Physical Medicine & Rehabilitation | Admitting: Physical Medicine & Rehabilitation

## 2020-09-29 ENCOUNTER — Encounter: Payer: Self-pay | Admitting: Physical Medicine & Rehabilitation

## 2020-09-29 VITALS — BP 112/73 | HR 60 | Temp 98.3°F | Ht 66.25 in | Wt 162.0 lb

## 2020-09-29 DIAGNOSIS — I69365 Other paralytic syndrome following cerebral infarction, bilateral: Secondary | ICD-10-CM | POA: Diagnosis not present

## 2020-09-29 DIAGNOSIS — G8112 Spastic hemiplegia affecting left dominant side: Secondary | ICD-10-CM | POA: Diagnosis not present

## 2020-09-29 DIAGNOSIS — G8110 Spastic hemiplegia affecting unspecified side: Secondary | ICD-10-CM | POA: Diagnosis not present

## 2020-09-29 NOTE — Progress Notes (Signed)
Xeomin Injection for spasticity using needle EMG guidance  Dilution: 50 Units/ml Indication: Severe spasticity which interferes with ADL,mobility and/or  hygiene and is unresponsive to medication management and other conservative care Informed consent was obtained after describing risks and benefits of the procedure with the patient. This includes bleeding, bruising, infection, excessive weakness, or medication side effects. A REMS form is on file and signed. Needle: 25g 2" needle electrode Number of units per muscle Xeomin 200U Left pectoralis 100U Left biceps 50U Left FCR 25U Left FDS 25U All injections were done after obtaining appropriate EMG activity and after negative drawback for blood. The patient tolerated the procedure well. Post procedure instructions were given. A followup appointment was made.

## 2020-09-29 NOTE — Patient Instructions (Signed)
You received a Xeomin injection today. You may experience soreness at the needle injection sites. Please call us if any of the injection sites turns red after a couple days or if there is any drainage. You may experience muscle weakness as a result of Xeomin This would improve with time but can take several weeks to improve. The Xeomin should start working in about one week. The Xeomin usually last 3 months. The injection can be repeated every 3 months as needed.  

## 2020-10-07 DIAGNOSIS — H524 Presbyopia: Secondary | ICD-10-CM | POA: Diagnosis not present

## 2020-10-07 DIAGNOSIS — H5340 Unspecified visual field defects: Secondary | ICD-10-CM | POA: Diagnosis not present

## 2020-10-07 DIAGNOSIS — H52223 Regular astigmatism, bilateral: Secondary | ICD-10-CM | POA: Diagnosis not present

## 2020-10-07 DIAGNOSIS — H25813 Combined forms of age-related cataract, bilateral: Secondary | ICD-10-CM | POA: Diagnosis not present

## 2020-10-07 DIAGNOSIS — Z0100 Encounter for examination of eyes and vision without abnormal findings: Secondary | ICD-10-CM | POA: Diagnosis not present

## 2020-11-16 ENCOUNTER — Other Ambulatory Visit: Payer: Self-pay | Admitting: Internal Medicine

## 2020-11-19 ENCOUNTER — Other Ambulatory Visit: Payer: Self-pay | Admitting: Internal Medicine

## 2020-11-26 IMAGING — CT CT HEAD W/O CM
3 series · 15 of 47 positions shown, 18 images · non-contrast
Comparison: MRI/MRA head 04/19/2019, head CT 04/17/2019

CLINICAL DATA: Stroke, follow-up.

EXAM:
CT HEAD WITHOUT CONTRAST
TECHNIQUE: Contiguous axial images were obtained from the base of the skull
through the vertex without intravenous contrast.

[Series 3: head 5.0 h30s · axial · 0.40mm/px · z∈[-157,-12]mm · 9 of 35 slices shown, 12 images]
[im 3/35  brain]
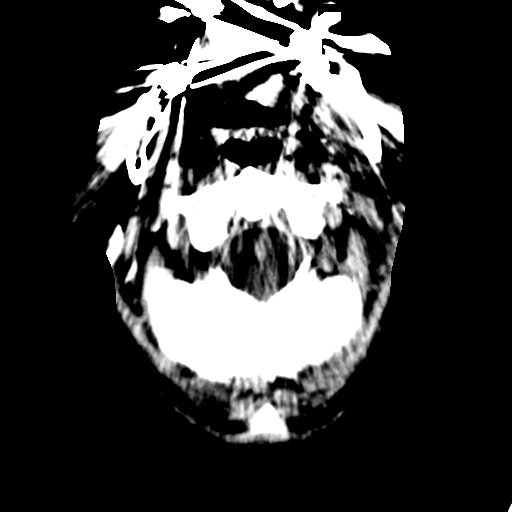
[im 3/35  bone]
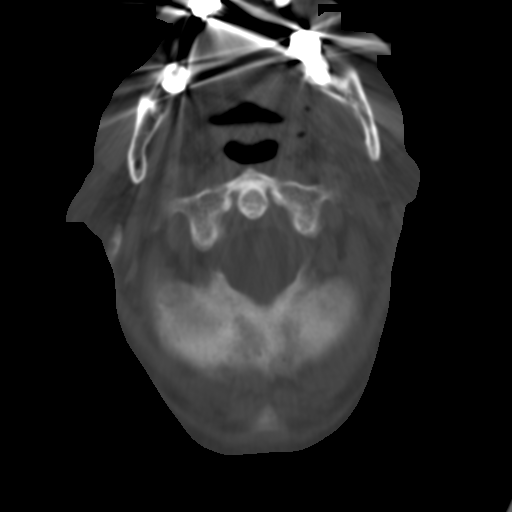
[im 6/35  brain]
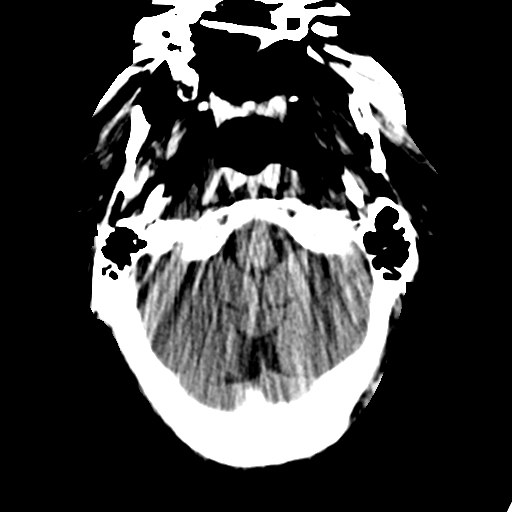
[im 10/35  brain]
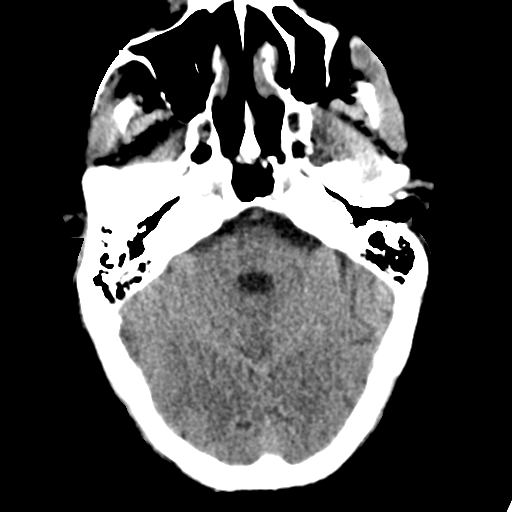
[im 13/35  brain]
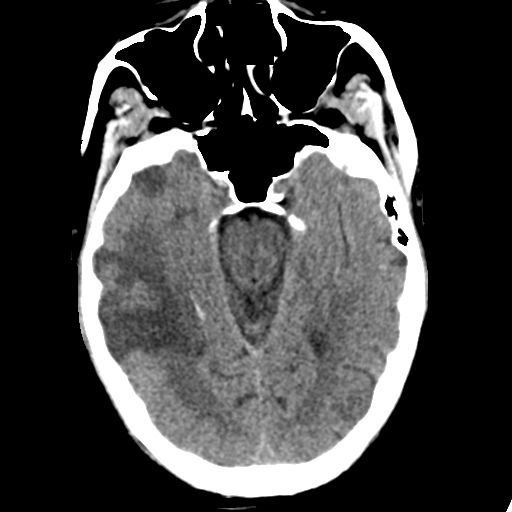
[im 18/35  brain]
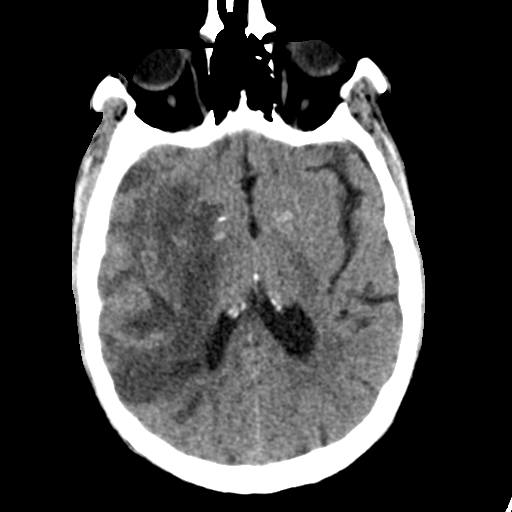
[im 18/35  bone]
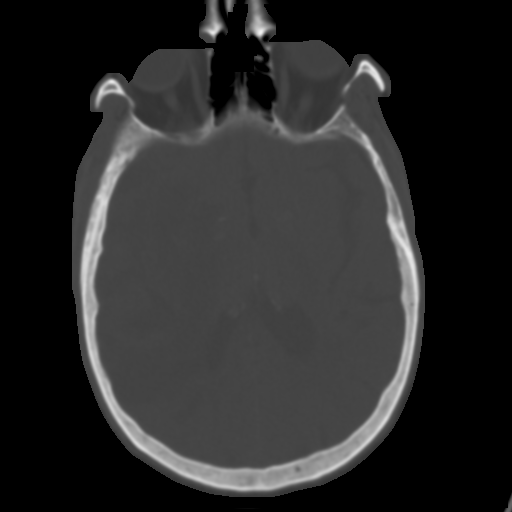
[im 22/35  brain]
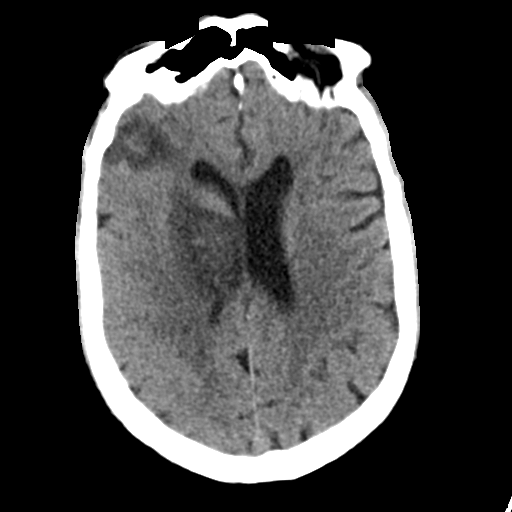
[im 25/35  brain]
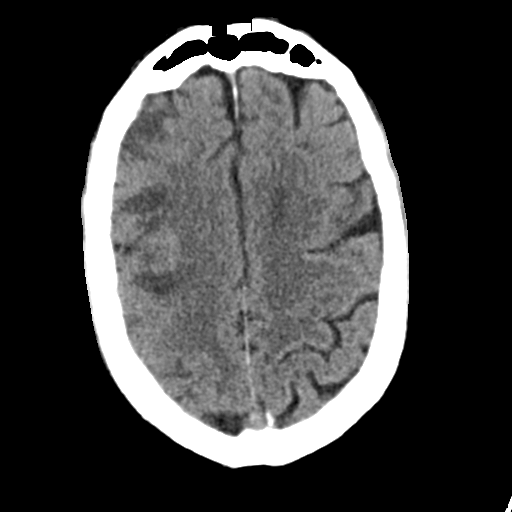
[im 29/35  brain]
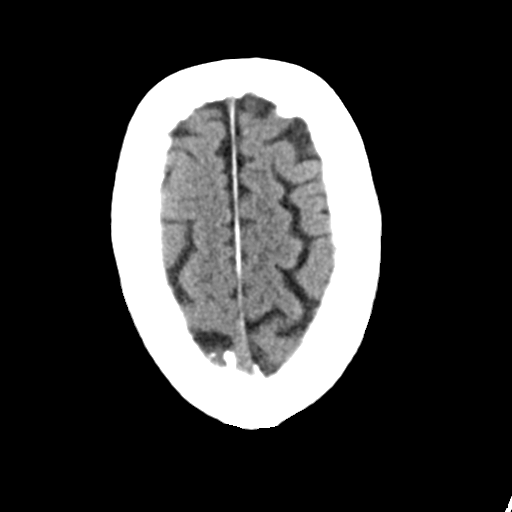
[im 32/35  brain]
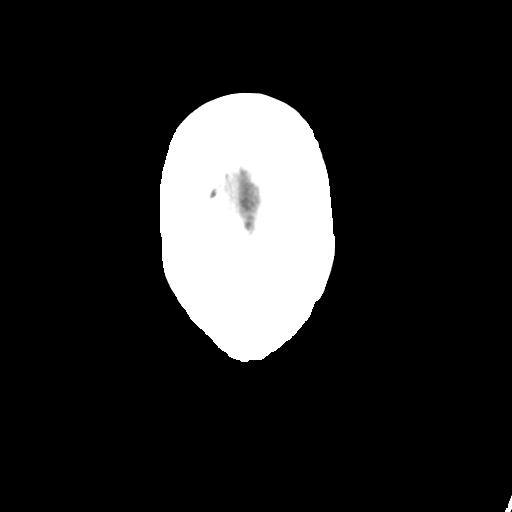
[im 32/35  bone]
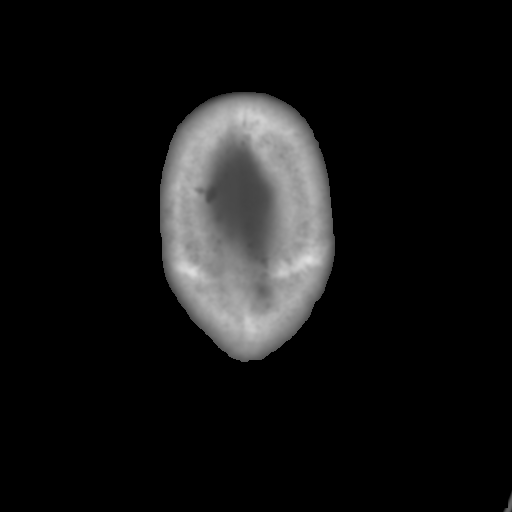

[Series 5: head 3.0 mpr cor · coronal · 0.32mm/px · 3 of 69 slices shown]
[im 23/69  brain]
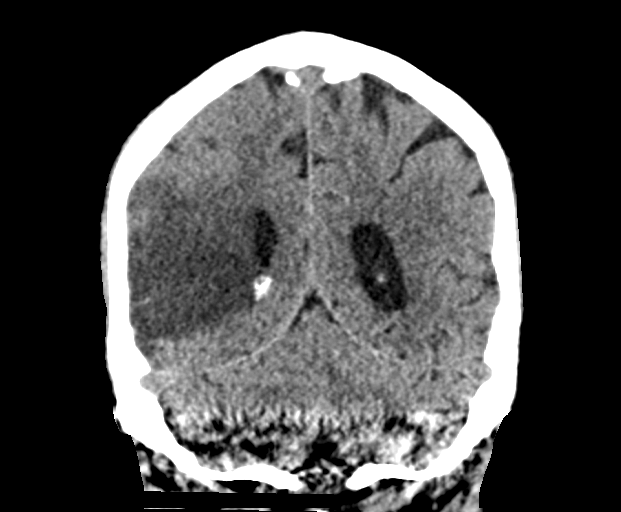
[im 31/69  brain]
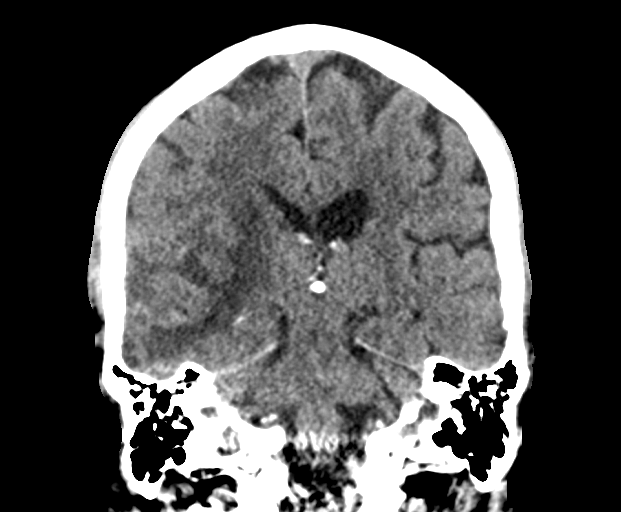
[im 38/69  brain]
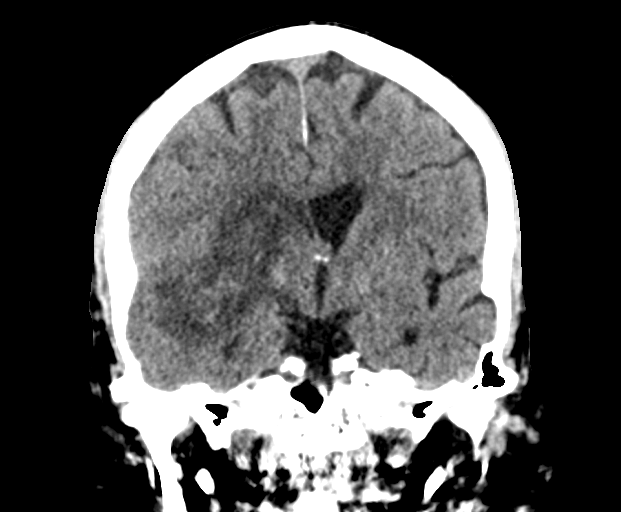

[Series 6: head 3.0 mpr sag · sagittal · 0.32mm/px · 3 of 66 slices shown]
[im 22/66  brain]
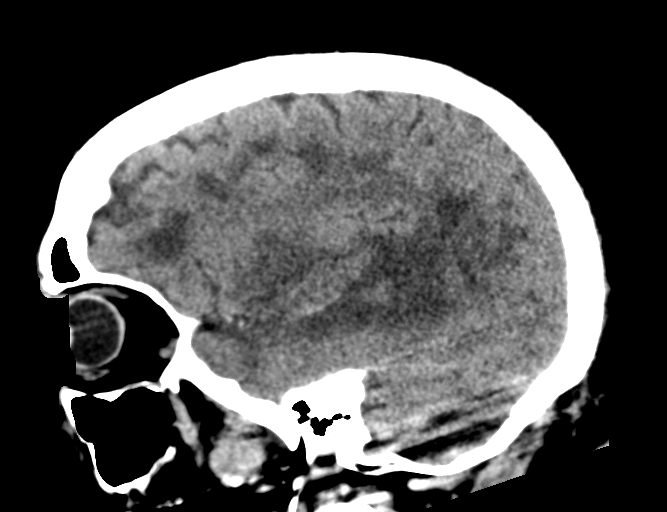
[im 33/66  brain]
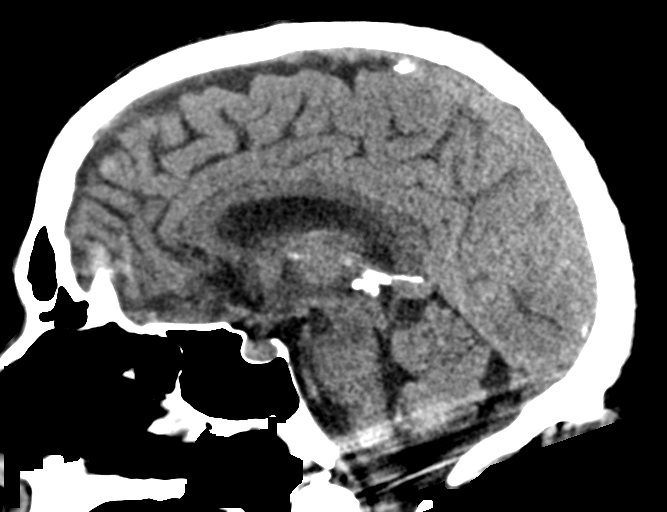
[im 44/66  brain]
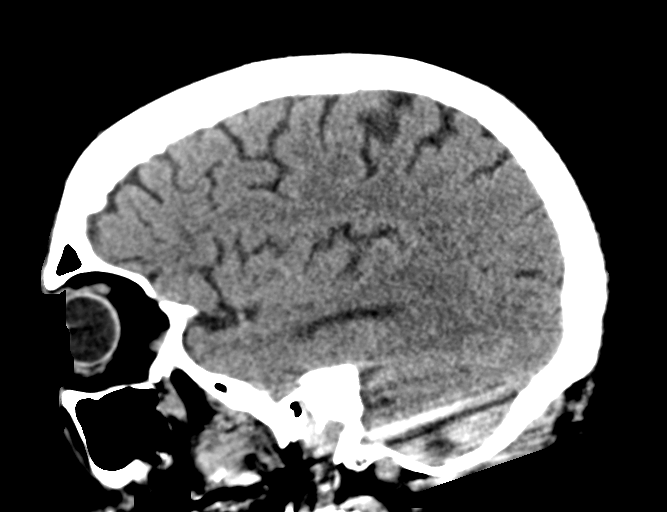

[15 of 47 positions shown; findings below may reference images not displayed]

FINDINGS: Brain:

A large, now early subacute, infarct involving much of the right MCA
vascular territory has not significantly changed in extent as
compared to prior MRI 04/19/2019. There is involvement of the
cortical and subcortical brain, right basal ganglia, thalamus as
well as internal and external capsules. Redemonstrated petechial
hemorrhage within portions of the infarct territory. There is
regional mass effect with partial effacement of the right lateral
ventricle. No midline shift. No extra-axial fluid collection. No
evidence of intracranial mass. Small infarcts within the left
hemispheric white matter better appreciated on prior MRI.

Vascular: No hyperdense vessel.  Atherosclerotic calcifications.

Skull: Normal. Negative for fracture or focal lesion.

Sinuses/Orbits: Visualized orbits demonstrate no acute abnormality.
Mild bilateral maxillary sinus mucosal thickening. No significant
mastoid effusion.
IMPRESSION: A large, now early subacute, infarct involving much of the right MCA
vascular territory has not significantly changed in extent.
Redemonstrated associated petechial hemorrhage. Regional mass effect
with partial effacement of the right lateral ventricle. No midline
shift.

Small infarcts within the left hemispheric white matter were better
appreciated on prior MRI 04/19/2019.

## 2020-12-01 ENCOUNTER — Encounter: Payer: Medicare HMO | Attending: Physical Medicine & Rehabilitation | Admitting: Physical Medicine & Rehabilitation

## 2020-12-01 ENCOUNTER — Other Ambulatory Visit: Payer: Self-pay

## 2020-12-01 ENCOUNTER — Encounter: Payer: Self-pay | Admitting: Physical Medicine & Rehabilitation

## 2020-12-01 VITALS — BP 125/76 | HR 68 | Temp 98.9°F | Ht 66.25 in | Wt 164.0 lb

## 2020-12-01 DIAGNOSIS — G8110 Spastic hemiplegia affecting unspecified side: Secondary | ICD-10-CM | POA: Diagnosis not present

## 2020-12-01 NOTE — Patient Instructions (Signed)
Injections next visit

## 2020-12-01 NOTE — Progress Notes (Signed)
Subjective:    Patient ID: Henry [name], male    DOB: 12/13/1940, 80 y.o.   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 Xeomin 200U Left pectoralis 100U Left biceps 50U Left FCR 25U Left FDS 25U Pain Inventory Average Pain 0 Pain Right Now 0 My pain is intermittent and aching  LOCATION OF PAIN  back pain  BOWEL Number of stools per week: 7 or more Oral laxative use Yes  Type of laxative stool softeners  Enema or suppository use No  History of colostomy No  Incontinent No   BLADDER Normal   Mobility how many minutes can you walk? 10 minutes ability to climb steps?  yes do you drive?  yes Do you have any goals in this area?  yes  Function retired  Neuro/Psych weakness trouble walking confusion  Prior Studies Any changes since last visit?  no  Physicians involved in your care Any changes since last visit?  no   Family History  Problem Relation Age of Onset   Colon cancer Sister 46   Breast cancer Other        1st degree relative   Stroke Neg Hx    Esophageal cancer Neg Hx    Pancreatic cancer Neg Hx    Prostate cancer Neg Hx    Rectal cancer Neg Hx    Stomach cancer Neg Hx    Social History   Socioeconomic History   Marital status: Married    Spouse name: Wilma   Number of children: 3   Years of education: 12   Highest education level: Not on file  Occupational History   Occupation: Retired    Fish farm manager: RETIRED  Tobacco Use   Smoking status: Former    Packs/day: 1.00    Types: Cigarettes    Quit date: 05/24/1976    Years since quitting: 44.5   Smokeless tobacco: Never  Vaping Use   Vaping Use: Never used  Substance and Sexual Activity   Alcohol use: No   Drug use: No   Sexual activity: Not  Currently  Other Topics Concern   Not on file  Social History Narrative   Not on file   Social Determinants of Health   Financial Resource Strain: Not on file  Food Insecurity: Not on file  Transportation Needs: Not on file  Physical Activity: Not on file  Stress: Not on file  Social Connections: Not on file   Past Surgical History:  Procedure Laterality Date   APPENDECTOMY     COLONOSCOPY     HERNIA REPAIR     march 2019   INGUINAL HERNIA REPAIR Left 2015   IR CT HEAD LTD  04/17/2019   IR PERCUTANEOUS ART THROMBECTOMY/INFUSION INTRACRANIAL INC DIAG ANGIO  04/17/2019   LUMBAR LAMINECTOMY/DECOMPRESSION MICRODISCECTOMY N/A 05/20/2016   Procedure: Lumbar Three-Four,Lunmbar Four-Five,Lumbar Five-Sacral One Laminectomy/Foraminotomy;  Surgeon: Kristeen Miss, MD;  Location: [facility];  Service: Neurosurgery;  Laterality: N/A;   POLYPECTOMY     RADIOLOGY WITH ANESTHESIA N/A 04/17/2019   Procedure: IR WITH ANESTHESIA;  Surgeon: Luanne Bras, MD;  Location: [facility];  Service: Radiology;  Laterality: N/A;   Past Medical History:  Diagnosis Date   Colon polyps    COVID-19    Elevated MCV 2011   GERD (gastroesophageal reflux disease) 2012  LBP (low back pain)    MRI L spine 06/2007: L L4 stenosis, 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   Osteoarthritis    Polycythemia 2011   Pulse 68   Temp 98.9 F (37.2 C)   Ht 5' 6.25" (1.683 m)   Wt 164 lb (74.4 kg)   SpO2 96%   BMI 26.27 kg/m   Opioid Risk Score:   Fall Risk Score:  `1  Depression screen PHQ 2/9  Depression screen [facility] 2/9 12/01/2020 08/18/2020 09/18/2019 02/26/2018 02/24/2017 04/14/2015  Decreased Interest 1 3 0 0 0 0  Down, Depressed, Hopeless 1 2 0 0 0 0  PHQ - 2 Score 2 5 0 0 0 0  Altered sleeping - 0 - - - -  Tired, decreased energy - 1 - - - -  Change in appetite - 0 - - - -  Feeling bad or failure about yourself  - 1 - - - -  Trouble concentrating - 0 - - - -  Moving slowly or fidgety/restless - 1 - - - -  Suicidal thoughts -  0 - - - -  PHQ-9 Score - 8 - - - -  Difficult doing work/chores - Somewhat difficult - - - -    Review of Systems  Musculoskeletal:  Positive for back pain and gait problem.  Neurological:  Positive for weakness.  Psychiatric/Behavioral:  Positive for confusion.   All other systems reviewed and are negative.     Objective:   Physical Exam Vitals and nursing note reviewed.  Constitutional:      Appearance: He is normal weight.  HENT:     Head: Normocephalic and atraumatic.  Eyes:     Extraocular Movements: Extraocular movements intact.     Conjunctiva/sclera: Conjunctivae normal.     Pupils: Pupils are equal, round, and reactive to light.  Neurological:     Mental Status: He is alert.  Psychiatric:        Mood and Affect: Mood normal.        Speech: Speech is delayed.        Behavior: Behavior normal.        Cognition and Memory: Cognition is impaired.     Comments: Patient perseverates on getting injection today even after clarification that he cannot be done within 12 weeks of prior injection.   MAS 3 in the left pectoralis MAS 3 at the left elbow flexors MAS 2 at left wrist flexors MAS 2 at left finger and MAS 3 left thumb flexors.       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  Would increase total botulinum toxin dose by 100 units, will add to biceps and FCR dose as well as inject the FPL left pectoralis 100U Left biceps 100U Left FCR 50U Left FDS 25U Left FPL 25U

## 2020-12-08 DIAGNOSIS — S8982XS Other specified injuries of left lower leg, sequela: Secondary | ICD-10-CM | POA: Diagnosis not present

## 2020-12-08 DIAGNOSIS — M2352 Chronic instability of knee, left knee: Secondary | ICD-10-CM | POA: Diagnosis not present

## 2020-12-08 DIAGNOSIS — M21862 Other specified acquired deformities of left lower leg: Secondary | ICD-10-CM | POA: Diagnosis not present

## 2020-12-31 ENCOUNTER — Encounter: Payer: Self-pay | Admitting: Internal Medicine

## 2020-12-31 ENCOUNTER — Ambulatory Visit (INDEPENDENT_AMBULATORY_CARE_PROVIDER_SITE_OTHER): Payer: Medicare HMO | Admitting: Internal Medicine

## 2020-12-31 ENCOUNTER — Ambulatory Visit (INDEPENDENT_AMBULATORY_CARE_PROVIDER_SITE_OTHER): Payer: Medicare HMO

## 2020-12-31 ENCOUNTER — Other Ambulatory Visit: Payer: Self-pay

## 2020-12-31 VITALS — BP 121/68 | HR 67 | Temp 98.6°F | Ht 66.5 in | Wt 165.0 lb

## 2020-12-31 DIAGNOSIS — I10 Essential (primary) hypertension: Secondary | ICD-10-CM

## 2020-12-31 DIAGNOSIS — D485 Neoplasm of uncertain behavior of skin: Secondary | ICD-10-CM | POA: Diagnosis not present

## 2020-12-31 DIAGNOSIS — M545 Low back pain, unspecified: Secondary | ICD-10-CM | POA: Diagnosis not present

## 2020-12-31 DIAGNOSIS — I700 Atherosclerosis of aorta: Secondary | ICD-10-CM

## 2020-12-31 DIAGNOSIS — Z8673 Personal history of transient ischemic attack (TIA), and cerebral infarction without residual deficits: Secondary | ICD-10-CM

## 2020-12-31 DIAGNOSIS — W1800XA Striking against unspecified object with subsequent fall, initial encounter: Secondary | ICD-10-CM | POA: Diagnosis not present

## 2020-12-31 DIAGNOSIS — I69354 Hemiplegia and hemiparesis following cerebral infarction affecting left non-dominant side: Secondary | ICD-10-CM

## 2020-12-31 DIAGNOSIS — I82402 Acute embolism and thrombosis of unspecified deep veins of left lower extremity: Secondary | ICD-10-CM | POA: Diagnosis not present

## 2020-12-31 DIAGNOSIS — Z23 Encounter for immunization: Secondary | ICD-10-CM

## 2020-12-31 DIAGNOSIS — R5383 Other fatigue: Secondary | ICD-10-CM | POA: Diagnosis not present

## 2020-12-31 DIAGNOSIS — E785 Hyperlipidemia, unspecified: Secondary | ICD-10-CM | POA: Diagnosis not present

## 2020-12-31 LAB — CBC WITH DIFFERENTIAL/PLATELET
Basophils Absolute: 0 10*3/uL (ref 0.0–0.1)
Basophils Relative: 0.5 % (ref 0.0–3.0)
Eosinophils Absolute: 0 10*3/uL (ref 0.0–0.7)
Eosinophils Relative: 0.5 % (ref 0.0–5.0)
HCT: 35 % — ABNORMAL LOW (ref 39.0–52.0)
Hemoglobin: 12 g/dL — ABNORMAL LOW (ref 13.0–17.0)
Lymphocytes Relative: 22.3 % (ref 12.0–46.0)
Lymphs Abs: 1.5 10*3/uL (ref 0.7–4.0)
MCHC: 34.2 g/dL (ref 30.0–36.0)
MCV: 115.7 fl — ABNORMAL HIGH (ref 78.0–100.0)
Monocytes Absolute: 0.6 10*3/uL (ref 0.1–1.0)
Monocytes Relative: 8.7 % (ref 3.0–12.0)
Neutro Abs: 4.6 10*3/uL (ref 1.4–7.7)
Neutrophils Relative %: 68 % (ref 43.0–77.0)
Platelets: 120 10*3/uL — ABNORMAL LOW (ref 150.0–400.0)
RBC: 3.02 Mil/uL — ABNORMAL LOW (ref 4.22–5.81)
RDW: 15.4 % (ref 11.5–15.5)
WBC: 6.8 10*3/uL (ref 4.0–10.5)

## 2020-12-31 LAB — COMPREHENSIVE METABOLIC PANEL
ALT: 21 U/L (ref 0–53)
AST: 55 U/L — ABNORMAL HIGH (ref 0–37)
Albumin: 4 g/dL (ref 3.5–5.2)
Alkaline Phosphatase: 75 U/L (ref 39–117)
BUN: 21 mg/dL (ref 6–23)
CO2: 31 mEq/L (ref 19–32)
Calcium: 9.4 mg/dL (ref 8.4–10.5)
Chloride: 103 mEq/L (ref 96–112)
Creatinine, Ser: 1.16 mg/dL (ref 0.40–1.50)
GFR: 60.23 mL/min (ref >60.00)
Glucose, Bld: 115 mg/dL — ABNORMAL HIGH (ref 70–99)
Potassium: 3.9 mEq/L (ref 3.5–5.1)
Sodium: 140 mEq/L (ref 135–145)
Total Bilirubin: 0.7 mg/dL (ref 0.2–1.2)
Total Protein: 7.2 g/dL (ref 6.0–8.3)

## 2020-12-31 LAB — URINALYSIS
Hgb urine dipstick: NEGATIVE
Ketones, ur: NEGATIVE
Leukocytes,Ua: NEGATIVE
Nitrite: NEGATIVE
Specific Gravity, Urine: >=1.03 — AB (ref 1.000–1.030)
Urine Glucose: NEGATIVE
Urobilinogen, UA: 0.2 (ref 0.0–1.0)
pH: 5.5 (ref 5.0–8.0)

## 2020-12-31 MED ORDER — AZITHROMYCIN 250 MG PO TABS: ORAL_TABLET | ORAL | 0 refills | Status: DC

## 2020-12-31 MED ORDER — CELECOXIB 200 MG PO CAPS: ORAL_CAPSULE | ORAL | 0 refills | Status: DC

## 2020-12-31 NOTE — Assessment & Plan Note (Signed)
On Lipitor 

## 2020-12-31 NOTE — Addendum Note (Signed)
Addended by: Earnstine Regal on: 12/31/2020 10:48 AM   Modules accepted: Orders

## 2020-12-31 NOTE — Assessment & Plan Note (Signed)
S/p a fall on Tue - he took his wife's BP med by accident (Coreg 25 mg) - BP was low w/SBP 80 x 2 days. He was c/o fatigue x several days. No LOC - lost his balance and fell backwards. No HA. No n/v.

## 2020-12-31 NOTE — Assessment & Plan Note (Signed)
Hold Diltiazem for now Drink more water Get up slowly

## 2020-12-31 NOTE — Assessment & Plan Note (Signed)
Cont w/BP control

## 2020-12-31 NOTE — Progress Notes (Signed)
Subjective:  Patient ID: Henry [name], male    DOB: 09-07-1940  Age: 80 y.o. MRN: [id number]  CC: Follow-up (3 MONTH F/U- Flu shot) and Fall (Wife states he fell 3 days ago. BP was running low 82/65 so she has not given him his BP med. 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 He was c/o fatigue x several days. No LOC - lost his balance and fell backwards. No HA. No n/v.  F/u CVA, HTN  Outpatient Medications Prior to Visit  Medication Sig Dispense Refill   aspirin EC 81 MG tablet Take 1 tablet (81 mg total) by mouth daily. Swallow whole. 30 tablet 11   atorvastatin (LIPITOR) 40 MG tablet TAKE ONE (1) TABLET BY MOUTH EVERY DAY AT 6PM. 30 tablet 5   B Complex-Folic Acid (B COMPLEX PLUS) TABS Take 1 tablet by mouth daily. 100 tablet 3   Cholecalciferol (VITAMIN D3) 50 MCG (2000 UT) capsule Take 1 capsule (2,000 Units total) by mouth daily. 100 capsule 3   diltiazem (CARDIZEM) 60 MG tablet TAKE 1 TABLET BY MOUTH TWICE DAILY. 60 tablet 5   memantine (NAMENDA) 10 MG tablet TAKE 1 TABLET BY MOUTH TWICE DAILY. 60 tablet 5   Misc Natural Products (PROSTATE HEALTH) CAPS Take 1 capsule by mouth daily.     QUEtiapine (SEROQUEL) 25 MG tablet TAKE 1/2 TABLET BY MOUTH IN THE MORNING AND TAKE 2 TABLETS AT NIGHT 90 tablet 3   Diclofenac Sodium (PENNSAID) 2 % SOLN Place 1 application onto the skin 4 (four) times daily as needed (arthritis). (Patient not taking: Reported on 12/31/2020) 112 g 5   donepezil (ARICEPT) 5 MG tablet Take 1 tablet (5 mg total) by mouth at bedtime. (Patient not taking: Reported on 12/31/2020) 90 tablet 0   No facility-administered medications prior to visit.    ROS: Review of Systems  Constitutional:  Negative for appetite change, fatigue and unexpected weight change.  HENT:  Negative for congestion, nosebleeds, sneezing, sore throat and trouble  swallowing.   Eyes:  Negative for itching and visual disturbance.  Respiratory:  Negative for cough.   Cardiovascular:  Negative for chest pain, palpitations and leg swelling.  Gastrointestinal:  Negative for abdominal distention, blood in stool, diarrhea and nausea.  Genitourinary:  Negative for frequency and hematuria.  Musculoskeletal:  Positive for back pain and gait problem. Negative for joint swelling and neck pain.  Skin:  Negative for rash.  Neurological:  Positive for dizziness and weakness. Negative for tremors and speech difficulty.  Psychiatric/Behavioral:  Negative for agitation, dysphoric mood and sleep disturbance. The patient is not nervous/anxious.    Objective:  BP 121/68 (BP Location: Left Arm)   Pulse 67   Temp 98.6 F (37 C) (Oral)   Ht 5' 6.5" (1.689 m)   Wt 165 lb (74.8 kg)   SpO2 95%   BMI 26.23 kg/m   BP Readings from Last 3 Encounters:  12/31/20 121/68  12/01/20 125/76  09/29/20 112/73    Wt Readings from Last 3 Encounters:  12/31/20 165 lb (74.8 kg)  12/01/20 164 lb (74.4 kg)  09/29/20 162 lb (73.5 kg)    Physical Exam Constitutional:      General: He is not in acute distress.    Appearance: He is well-developed.     Comments:  NAD  Eyes:     Conjunctiva/sclera: Conjunctivae normal.     Pupils: Pupils are equal, round, and reactive to light.  Neck:     Thyroid: No thyromegaly.     Vascular: No JVD.  Cardiovascular:     Rate and Rhythm: Normal rate and regular rhythm.     Heart sounds: Normal heart sounds. No murmur heard.   No friction rub. No gallop.  Pulmonary:     Effort: Pulmonary effort is normal. No respiratory distress.     Breath sounds: Normal breath sounds. No wheezing or rales.  Chest:     Chest wall: No tenderness.  Abdominal:     General: Bowel sounds are normal. There is no distension.     Palpations: Abdomen is soft. There is no mass.     Tenderness: There is no abdominal tenderness. There is no guarding or rebound.   Musculoskeletal:        General: Tenderness present. Normal range of motion.     Cervical back: Normal range of motion.  Lymphadenopathy:     Cervical: No cervical adenopathy.  Skin:    General: Skin is warm and dry.     Findings: Bruising and lesion present. No rash.  Neurological:     Mental Status: He is alert and oriented to person, place, and time.     Cranial Nerves: No cranial nerve deficit.     Motor: No abnormal muscle tone.     Coordination: Coordination abnormal.     Gait: Gait abnormal.     Deep Tendon Reflexes: Reflexes are normal and symmetric.  Psychiatric:        Behavior: Behavior normal.        Thought Content: Thought content normal.        Judgment: Judgment normal.   Face w/abrasion LS - painful R forearm nodule 1 cm  Lab Results  Component Value Date   WBC 4.9 06/01/2020   HGB 13.2 06/01/2020   HCT 38.0 (L) 06/01/2020   PLT 152.0 06/01/2020   GLUCOSE 113 (H) 06/01/2020   CHOL 147 08/26/2019   TRIG 180.0 (H) 08/26/2019   HDL 41.80 08/26/2019   LDLDIRECT 135.0 02/24/2017   LDLCALC 70 08/26/2019   ALT 19 06/01/2020   AST 18 06/01/2020   NA 141 06/01/2020   K 4.1 06/01/2020   CL 104 06/01/2020   CREATININE 0.98 06/01/2020   BUN 15 06/01/2020   CO2 31 06/01/2020   TSH 1.24 09/05/2019   PSA 1.75 02/24/2017   INR 1.2 04/17/2019   HGBA1C 6.6 (H) 05/29/2019    CT Chest Wo Contrast  Result Date: 12/11/2019 CLINICAL DATA:  Persistent cough, sometimes productive. History of COVID infection in February. EXAM: CT CHEST WITHOUT CONTRAST TECHNIQUE: Multidetector CT imaging of the chest was performed following the standard protocol without IV contrast. COMPARISON:  None. FINDINGS: Cardiovascular: Atherosclerotic calcification of the aorta, aortic valve and coronary arteries. Distal transverse aorta is mildly ectatic, 3.1 cm. Heart is enlarged. No pericardial effusion. Mediastinum/Nodes: Mediastinal lymph nodes measure up to 11 mm in the low right  paratracheal station. Hilar regions are difficult to evaluate without IV contrast. No axillary adenopathy. Esophagus is unremarkable. Lungs/Pleura: Biapical pleuroparenchymal scarring. Centrilobular and paraseptal emphysema. Peripheral subpleural reticular densities and ground-glass with traction bronchiectasis/bronchiolectasis. Difficult to differentiate paraseptal emphysema from honeycombing, especially in the left upper lobe. No pleural fluid. Minimal debris in the airway. Upper Abdomen: Visualized portions of the liver, gallbladder, adrenal glands, right kidney, spleen, pancreas, stomach and  bowel are grossly unremarkable. Musculoskeletal: Degenerative changes in the spine. No worrisome lytic or sclerotic lesions. IMPRESSION: 1. Pulmonary parenchymal pattern of fibrosis can be seen with usual interstitial pneumonitis or nonspecific interstitial pneumonitis. 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 2. Borderline enlarged mediastinal lymph nodes be seen in the setting of interstitial lung disease. Difficult to definitively exclude a lymphoproliferative disorder. 3. Aortic atherosclerosis (ICD10-I70.0). Coronary artery calcification. 4.  Emphysema (ICD10-J43.9). Electronically Signed   By: Lorin Picket M.D.   On: 12/11/2019 08:04    Assessment & Plan:   Problem List Items Addressed This Visit     Atherosclerosis of aorta ([place])    Cont w/BP control      DVT (deep venous thrombosis) ([facility])    On ASA      Dyslipidemia    On Lipitor      Fall against object - Primary    S/p a fall on Tue - he took his wife's BP med by accident (Coreg 25 mg) - BP was low w/SBP 80 x 2 days. He was c/o fatigue x several days. No LOC - lost his balance and fell backwards. No HA. No n/v.      Relevant Orders   DG Lumbar Spine 2-3 Views   CBC with Differential/Platelet   Urinalysis   Comprehensive metabolic panel   Fatigue    Treat bronchitis CBC, CMET Hydrate well       Relevant Orders   CBC with Differential/Platelet   Urinalysis   Comprehensive metabolic panel   Hemiparesis affecting left side as late effect of stroke ([place])    Doing better      History of embolic stroke    On ASA      HTN (hypertension)    Hold Diltiazem for now Drink more water Get up slowly      Neoplasm of uncertain behavior of skin      New R forearm nodule 1 cm - ?cancer - they will see a dermtologist      Other Visit Diagnoses     Needs flu shot       Relevant Orders   Flu Vaccine QUAD High Dose(Fluad) (Completed)         Meds ordered this encounter  Medications   azithromycin (ZITHROMAX Z-PAK) 250 MG tablet    Sig: As directed    Dispense:  6 tablet    Refill:  0   celecoxib (CELEBREX) 200 MG capsule    Sig: One po qd or BID prn pain    Dispense:  60 capsule    Refill:  0      Follow-up: No follow-ups on file.  Walker Kehr, MD

## 2020-12-31 NOTE — Assessment & Plan Note (Addendum)
New R forearm nodule 1 cm - ?cancer - they will see a dermtologist

## 2020-12-31 NOTE — Assessment & Plan Note (Signed)
Treat bronchitis CBC, CMET Hydrate well

## 2020-12-31 NOTE — Assessment & Plan Note (Signed)
On ASA 

## 2020-12-31 NOTE — Addendum Note (Signed)
Addended by: Boris Lown B on: 12/31/2020 10:40 AM   Modules accepted: Orders

## 2020-12-31 NOTE — Assessment & Plan Note (Signed)
Doing better.   

## 2021-01-01 DIAGNOSIS — M9901 Segmental and somatic dysfunction of cervical region: Secondary | ICD-10-CM | POA: Diagnosis not present

## 2021-01-01 DIAGNOSIS — M5388 Other specified dorsopathies, sacral and sacrococcygeal region: Secondary | ICD-10-CM | POA: Diagnosis not present

## 2021-01-01 DIAGNOSIS — M9905 Segmental and somatic dysfunction of pelvic region: Secondary | ICD-10-CM | POA: Diagnosis not present

## 2021-01-01 DIAGNOSIS — M9904 Segmental and somatic dysfunction of sacral region: Secondary | ICD-10-CM | POA: Diagnosis not present

## 2021-01-01 DIAGNOSIS — M47816 Spondylosis without myelopathy or radiculopathy, lumbar region: Secondary | ICD-10-CM | POA: Diagnosis not present

## 2021-01-01 DIAGNOSIS — M47812 Spondylosis without myelopathy or radiculopathy, cervical region: Secondary | ICD-10-CM | POA: Diagnosis not present

## 2021-01-01 DIAGNOSIS — M47817 Spondylosis without myelopathy or radiculopathy, lumbosacral region: Secondary | ICD-10-CM | POA: Diagnosis not present

## 2021-01-01 DIAGNOSIS — M9903 Segmental and somatic dysfunction of lumbar region: Secondary | ICD-10-CM | POA: Diagnosis not present

## 2021-01-07 ENCOUNTER — Other Ambulatory Visit: Payer: Self-pay

## 2021-01-07 ENCOUNTER — Ambulatory Visit: Payer: Medicare HMO | Admitting: Adult Health

## 2021-01-07 ENCOUNTER — Encounter: Payer: Self-pay | Admitting: Adult Health

## 2021-01-07 VITALS — BP 104/68 | HR 90 | Ht 69.0 in | Wt 163.0 lb

## 2021-01-07 DIAGNOSIS — U071 COVID-19: Secondary | ICD-10-CM | POA: Diagnosis not present

## 2021-01-07 DIAGNOSIS — G319 Degenerative disease of nervous system, unspecified: Secondary | ICD-10-CM

## 2021-01-07 DIAGNOSIS — G8114 Spastic hemiplegia affecting left nondominant side: Secondary | ICD-10-CM

## 2021-01-07 DIAGNOSIS — I639 Cerebral infarction, unspecified: Secondary | ICD-10-CM | POA: Diagnosis not present

## 2021-01-07 MED ORDER — DONEPEZIL HCL 10 MG PO TABS: 10.0000 mg | ORAL_TABLET | Freq: Every day | ORAL | 3 refills | Status: DC

## 2021-01-07 NOTE — Progress Notes (Signed)
[facility] [address] [place] [address] [phone]       OFFICE FOLLOW UPVISIT NOTE  Henry [name] Date of Birth:  05-02-1940 Medical Record Number:  [id number]   Referring MD: Rosalin Hawking  Reason for Referral: Stroke  Chief Complaint  Patient presents with   Follow-up    Rm 2 with spouse Wilma  Pt is well, still having some L sided complications with dragging his foot and hand immobility  Spouse states his mind has improved       HPI:   Update 01/07/2021 Henry [name]: Returns for 36-month follow-up visit accompanied by his wife.  Stable from stroke standpoint -denies new stroke/TIA symptoms  Left spastic hemiparesis -started to receive Botox injections by Dr. Letta Pate - first injection 09/29/2020; plans on repeat injections 01/21/2021. Did not notice much benefit with first injection but hopeful for future injections. Since obtain AFO brace which has been helpful but had a full last week resulting in L2 compression fracture and has not been using brace since that time. Scheduled to see Dr. Ellene Route 12/23.  Currently wearing back brace. Prior to fall, keeping active and going to the gym routinely.  He ambulates without assistive device.  Cognitive impairment -MMSE today 23/30 (prior 26/20).  Per wife, believes cognition has been improving but does fluctuate (good days and bad days). Has been able to stay by himself without safety concerns. Does drive but wife rides with him - she has no concerns of his driving ability. Avoids main roads and highways. Remains on Namenda per PCP -denies side effects. On Aricept previously by PCP - refilled at prior visit with request of PCP refilling - per wife, this was stopped "months ago" as unable to get a refill.  Denies ongoing behavioral concerns -remains on Seroquel per PCP.  Maintains ADLs majority of IADLs independently  Compliant on aspirin and atorvastatin -denies side effects Blood pressure today 104/68 -monitors  at home.  Gradually decreasing BP meds assisted by PCP  No new concerns at this time     History provided for reference purposes only Update 07/06/2020 Henry [name]: Henry [name] returns for 44-month follow-up with history of stroke and vascular dementia.  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  Denies new stroke/TIA symptoms Routine exercise at [employer] 3-4x/week as well as routine exercising at home.  Left arm spasticity greater than leg spasticity but does have occasional gait difficulty as left knee has been hyperextending.  Cognitive impairment relatively stable with MMSE today 26/30 (prior 27/30).  Remains on Namenda but wife is unsure if he has continued on Aricept.  Also remains on Seroquel 12.5mg  AM and 25mg  PM per PCP for prior behavioral concerns with paranoia with continued benefit and no reoccurrence.  Remains active maintaining ADLs and majority of IADLs independently.  Compliant on aspirin and atorvastatin without associated side effects Blood pressure today 114/70 - monitors at home which has been stable  No further concerns at this time   Update 01/06/2020 Henry [name]: Henry [name] returns for 81-month follow-up with history of stroke and vascular dementia with behaviors.  He has been doing well since prior visit with improvement of paranoia and behaviors.  Cognition stable since prior visit with possible improvement.  Dr. Leonie Man initiated Namenda at prior visit which she is continued on tolerating well without side effects.  He also remains on Seroquel and Aricept per PCP.  Residual stroke deficit of spastic left hemiparesis stable without  worsening.  Denies new stroke/TIA symptoms.  Remains on Eliquis for secondary stroke prevention and DVT without bleeding or bruising.  Lower extremity Dopplers 10/17/2019 showed resolution of prior DVT.  Remains on atorvastatin 20 mg daily tolerating well without side effects.  Blood pressure today 110/70.   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  No further concerns at this time.  Update 10/14/2019 Dr. Leonie Man : He returns for follow-up after last visit 2 months ago.  Is accompanied by his wife.  Wife is concerned about patient's behavior which has changed.  Is having some bad thoughts and states times feels like getting himself.  He is also become paranoid and feels that wife is leaving him.  He has seen his primary care physician who recently started him on Seroquel and is currently taking 12 point 5 in the morning and 50 at night which seems to be be helping as is sleeping better.  He still have intermittent agitation, verbal abuse no physical threats as well as confusion initially responded well to Seroquel but it was stopped but seems to be doing better with recent dose change.  He was also started on Aricept l and is taking 5 daily which she seems to be tolerating well but does not seem to have helped him much.  LDL cholesterol in 08/26/2019 was 70 mg percent but triglycerides were elevated at 180.  Lab work on 09/05/2019 showed vitamin B12 TSH and vitamin D to be normal.  CT scan of the head was done on 09/10/2019 due to a fall which showed no acute abnormalities.  He is currently doing outpatient physical occupational therapy and is ambulating well.  Walks with a cane.  He has had no recurrent stroke or TIA symptoms.  He remains on Eliquis which is tolerating well without bruising or bleeding.  At last visit had ordered an outpatient carotid ultrasound which for unclear reason has not yet been done.  Initial visit 08/20/2019 Dr. Leonie Man: Henry [name] is a 80 year old Caucasian male seen today for initial office consultation visit.  History is obtained from the patient, review of electronic medical records and have personally reviewed imaging films in PACS.  Patient has past medical history of hypertension, polycythemia, low back pain and recent Covid infection who presented to [facility] on 04/17/2019 as a code stroke for sudden onset of left-sided weakness and neglect.  On arrival he had NIH stroke scale of 16.  CT scan of the head showed early acute ischemic changes involving the right MCA territory and is aspect score was 4.  CT perfusion however showed only 10 mm core infarct with 147 cc of penumbra with a mismatch of 137 mL.  Due to this mismatch patient was taken for emergent MRI scan which showed a large area of restricted diffusion however with corresponding T2 flair mismatch hence after discussion with patient and daughter as well as interventional neuroradiologist it was decided to take the patient for mechanical thrombectomy which was performed successfully with TICI 2 c reveasularization by Dr. Estanislado Pandy.  He was kept in the intensive care unit for blood pressure was tightly controlled.  Follow-up scan showed large right MCA infarct with some petechial hemorrhage with local mass-effect.  2D echo showed ejection fraction 55 to 60% without cardiac source of embolism.  Lower extremity venous Doppler showed DVT in the left gastrocnemius vein.  Transcranial Doppler study was negative for PFO.  LDL cholesterol was 53 mg percent hemoglobin A1c was 6.8.  Patient was transferred to skilled nursing facility for rehab and started on Eliquis for his DVT.  Patient states she is done well he has finished rehabilitation and skilled nursing facility and is presently living at home.  He is finished home physical and occupational therapy and plans to start outpatient therapy this week.  He is able to ambulate with a cane he states his balance is good is an only one minor fall.  He is on Eliquis which is tolerating well without bleeding or bruising.  His blood pressures well controlled today it is 126/74.  He is tolerating Lipitor well without muscle aches and pains but he states he is constipated and feels this may be related to Lipitor.    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   Osteoarthritis    Polycythemia 2011    Social History:  Social History   Socioeconomic History   Marital status: Married    Spouse name: Wilma   Number of children: 3   Years of education: 12   Highest education level: Not on file  Occupational History   Occupation: Retired    Fish farm manager: RETIRED  Tobacco Use   Smoking status: Former    Packs/day: 1.00    Types: Cigarettes    Quit date: 05/24/1976    Years since quitting: 44.6   Smokeless tobacco: Never  Vaping Use   Vaping Use: Never used  Substance and Sexual Activity   Alcohol use: No   Drug use: No   Sexual activity: Not Currently  Other Topics Concern   Not on file  Social History Narrative   Not on file   Social Determinants of Health   Financial Resource Strain: Not on file  Food Insecurity: Not on file  Transportation Needs: Not on file  Physical Activity: Not on file  Stress: Not on file  Social Connections: Not on file  Intimate Partner Violence: Not on file    Medications:   Current Outpatient Medications on File Prior to Visit  Medication Sig Dispense Refill   aspirin EC 81 MG tablet Take 1 tablet (81 mg total) by mouth daily. Swallow whole. 30 tablet 11   atorvastatin (LIPITOR) 40 MG tablet TAKE ONE (1) TABLET BY MOUTH EVERY DAY AT 6PM. 30 tablet 5   azithromycin (ZITHROMAX Z-PAK) 250 MG tablet As directed 6 tablet 0   B Complex-Folic Acid (B COMPLEX PLUS) TABS Take 1 tablet by mouth daily. 100 tablet 3   celecoxib (CELEBREX) 200 MG capsule One po qd or BID prn pain 60 capsule 0   Cholecalciferol (VITAMIN D3) 50 MCG (2000 UT) capsule Take 1 capsule (2,000 Units total) by mouth daily. 100 capsule 3   diltiazem  (CARDIZEM) 60 MG tablet TAKE 1 TABLET BY MOUTH TWICE DAILY. 60 tablet 5   memantine (NAMENDA) 10 MG tablet TAKE 1 TABLET BY MOUTH TWICE DAILY. 60 tablet 5   Misc Natural Products (PROSTATE HEALTH) CAPS Take 1 capsule by mouth daily.     QUEtiapine (SEROQUEL) 25 MG tablet TAKE 1/2 TABLET BY MOUTH IN THE MORNING AND TAKE 2 TABLETS AT NIGHT (Patient taking differently: Take 25  mg by mouth at bedtime.) 90 tablet 3   No current facility-administered medications on file prior to visit.    Allergies:   Allergies  Allergen Reactions   No Known Allergies     Physical Exam Today's Vitals   01/07/21 0933  BP: 104/68  Pulse: 90  Weight: 163 lb (73.9 kg)  Height: 5\' 9"  (1.753 m)    Body mass index is 24.07 kg/m.   General: well developed, well nourished pleasant elderly Caucasian male, seated, in no evident distress Head: head normocephalic and atraumatic.   Neck: supple with no carotid or supraclavicular bruits Cardiovascular: regular rate and rhythm, soft ejection murmur. Musculoskeletal: no deformity Skin:  no rash/petichiae Vascular:  Normal pulses all extremities  Neurologic Exam Mental Status: Awake and fully alert. oriented to place and time. Recent memory impaired and remote memory intact. Attention span, concentration and fund of knowledge mostly appropriate with wife providing some information.  Mood and affect appropriate.  MMSE - Mini Mental State Exam 01/07/2021 07/06/2020 01/06/2020  Orientation to time 2 3 3   Orientation to Place 4 5 5   Registration 3 3 3   Attention/ Calculation 5 5 5   Recall 1 2 2   Language- name 2 objects 2 2 2   Language- repeat 1 1 1   Language- follow 3 step command 3 3 3   Language- read & follow direction 1 1 1   Write a sentence 1 1 1   Write a sentence-comments - - -  Copy design 0 0 1  Total score 23 26 27    Cranial Nerves: Pupils equal, briskly reactive to light. Extraocular movements full without nystagmus. 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 Hearing diminished bilaterally. Facial sensation intact.  Mild left lower facial asymmetry., tongue, palate moves normally and symmetrically.  Motor:  LUE: 4/5 proximal and 3/5 hand with increased tone throughout LLE: 5/5 proximal and 4+/5 ADF Sensory.: intact to touch , pinprick , position and vibratory sensation.  Coordination: Rapid alternating movements normal in all extremities except decreased left hand. Finger-to-nose performed accurately RUE and heel-to-shin performed accurately bilaterally. Gait and Station: Arises from chair without difficulty. Stance is slightly hunched. Gait abnormality with consistent left knee hyperflexion and decreased stride length without use of assistive device.  Tandem walk and heel toe not attempted. Reflexes: 1+ RUE and RLE and brisker on the left UE>LE. Toes downgoing.       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  Diagnosed with acute DVT in the left leg and treated with Eliquis.  Repeat lower extremity Dopplers showed resolution of prior DVT therefore Eliquis discontinued    R MCA stroke -Residual left spastic hemiparesis and visual impairment. Stable since prior visit - now followed by PMR receiving Botox injections.  -Continue aspirin 81 mg daily and atorvastatin for secondary stroke prevention -Discussed secondary stroke prevention measures and importance of close PCP follow-up for aggressive stroke risk factor management including HTN with BP goal<130/90 and HLD with LDL goal<70.   Mild vascular cognitive impairment with behaviors - MMSE today 23/30 (26/30) -subjectively, memory improved per wife -No additional behavioral concerns -Continue Seroquel and Aricept as prescribed by PCP as well as continued use of Namenda -refill for  Aricept provided today per request but request future refills provided by PCP (was on this prior to stroke) -Discussed importance of managing stroke risk factors as well  as memory exercises such as crossword puzzles, word search, sudoku, card games and reading  Hypercoagulable state in setting of COVID, resolved -Repeat D-dimer and CRP 01/2020 WNL -repeat LE doppler 10/2019 resolution of prior acute DVT -No indication for prolonged anticoagulation - d/c'd Eliquis 01/2020    Follow-up in 6 months or call earlier if needed  CC:  [name], [name], MD    I spent 39 minutes of face-to-face and non-face-to-face time with patient and wife.  This included previsit chart review, lab review, study review, order entry, electronic health record documentation, patient and wife education and discussion regarding history of prior stroke with residual deficits, secondary stroke prevention measures and aggressive stroke risk factor management, vascular cognitive impairment with completion and review of MMSE and use of medications and answered all questions to patient's satisfaction    Frann Rider, [facility]  [facility] [address] [place], [place] [address]  Phone [phone] Fax [phone] Note: This document was prepared with digital dictation and possible smart phrase technology. Any transcriptional errors that result from this process are unintentional.

## 2021-01-07 NOTE — Patient Instructions (Signed)
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       Followup in the future with me in 6 months or call earlier if needed       Thank you for coming to see [place] at [facility]. I hope we have been able to provide you high quality care today.  You may receive a patient satisfaction survey over the next few weeks. We would appreciate your feedback and comments so that we may continue to improve ourselves and the health of our patients.

## 2021-01-11 ENCOUNTER — Telehealth: Payer: Self-pay | Admitting: Internal Medicine

## 2021-01-11 MED ORDER — CELECOXIB 200 MG PO CAPS: ORAL_CAPSULE | ORAL | 0 refills | Status: DC

## 2021-01-11 NOTE — Telephone Encounter (Signed)
Add Tylenol 650 mg qid prn. Use ice/heat. Stronger meds can make him confused. Thx

## 2021-01-11 NOTE — Telephone Encounter (Signed)
Patient wife calling in  Says patient has been taking the celecoxib (CELEBREX) 200 MG capsule for pain .. it helps a little bit but does not completely take te pain away  Would like to know if provider can send in a different type of pain medication for patient  Pharmacy:  [facility], [place].  Phone:  [phone] Fax:  [phone]

## 2021-01-11 NOTE — Telephone Encounter (Signed)
Noted.  Prescription renewed.  Never take more medicine than prescribed.

## 2021-01-11 NOTE — Telephone Encounter (Signed)
Notified pt wife w/ MD response. Wife states he is almost out of the celebrex he has been taking 4 a day.. 2 in the morning and 2 at night. Will need new rx.Marland KitchenJohny Chess

## 2021-01-12 DIAGNOSIS — C44622 Squamous cell carcinoma of skin of right upper limb, including shoulder: Secondary | ICD-10-CM | POA: Diagnosis not present

## 2021-01-12 DIAGNOSIS — L570 Actinic keratosis: Secondary | ICD-10-CM | POA: Diagnosis not present

## 2021-01-12 NOTE — Telephone Encounter (Signed)
Notified pt wife with MD response../lmb ?

## 2021-01-21 ENCOUNTER — Other Ambulatory Visit: Payer: Self-pay

## 2021-01-21 ENCOUNTER — Ambulatory Visit (INDEPENDENT_AMBULATORY_CARE_PROVIDER_SITE_OTHER): Payer: Medicare HMO | Admitting: Internal Medicine

## 2021-01-21 ENCOUNTER — Encounter: Payer: Medicare HMO | Admitting: Physical Medicine & Rehabilitation

## 2021-01-21 ENCOUNTER — Encounter: Payer: Self-pay | Admitting: Internal Medicine

## 2021-01-21 DIAGNOSIS — F22 Delusional disorders: Secondary | ICD-10-CM

## 2021-01-21 DIAGNOSIS — N320 Bladder-neck obstruction: Secondary | ICD-10-CM | POA: Diagnosis not present

## 2021-01-21 DIAGNOSIS — I69354 Hemiplegia and hemiparesis following cerebral infarction affecting left non-dominant side: Secondary | ICD-10-CM | POA: Diagnosis not present

## 2021-01-21 DIAGNOSIS — R5383 Other fatigue: Secondary | ICD-10-CM

## 2021-01-21 DIAGNOSIS — E785 Hyperlipidemia, unspecified: Secondary | ICD-10-CM | POA: Diagnosis not present

## 2021-01-21 MED ORDER — FINASTERIDE 5 MG PO TABS: 5.0000 mg | ORAL_TABLET | Freq: Every day | ORAL | 3 refills | Status: DC

## 2021-01-21 NOTE — Assessment & Plan Note (Signed)
Worse Start Proscar

## 2021-01-21 NOTE — Assessment & Plan Note (Signed)
Cont on Atorvastatin

## 2021-01-21 NOTE — Assessment & Plan Note (Signed)
Much better  Cont w/Seroquel

## 2021-01-21 NOTE — Progress Notes (Signed)
Subjective:  Patient ID: Henry [name], male    DOB: [birth year]  Age: 80 y.o. MRN: [id number]  CC: Follow-up   HPI Henry [name] presents for low BP and falls - much better. SBP 100-107 off BP meds F/u CVA h/o, LBP C/o bladder dribbling  Outpatient Medications Prior to Visit  Medication Sig Dispense Refill   acetaminophen (TYLENOL 8 HOUR ARTHRITIS PAIN) 650 MG CR tablet Take 650 mg by mouth every 6 (six) hours as needed for pain.     aspirin EC 81 MG tablet Take 1 tablet (81 mg total) by mouth daily. Swallow whole. 30 tablet 11   atorvastatin (LIPITOR) 40 MG tablet TAKE ONE (1) TABLET BY MOUTH EVERY DAY AT 6PM. 30 tablet 5   B Complex-Folic Acid (B COMPLEX PLUS) TABS Take 1 tablet by mouth daily. 100 tablet 3   celecoxib (CELEBREX) 200 MG capsule One po qd or BID prn pain 60 capsule 0   Cholecalciferol (VITAMIN D3) 50 MCG (2000 UT) capsule Take 1 capsule (2,000 Units total) by mouth daily. 100 capsule 3   donepezil (ARICEPT) 10 MG tablet Take 1 tablet (10 mg total) by mouth at bedtime. 90 tablet 3   memantine (NAMENDA) 10 MG tablet TAKE 1 TABLET BY MOUTH TWICE DAILY. 60 tablet 5   Misc Natural Products (PROSTATE HEALTH) CAPS Take 1 capsule by mouth daily.     QUEtiapine (SEROQUEL) 25 MG tablet TAKE 1/2 TABLET BY MOUTH IN THE MORNING AND TAKE 2 TABLETS AT NIGHT (Patient taking differently: Take 25 mg by mouth at bedtime.) 90 tablet 3   azithromycin (ZITHROMAX Z-PAK) 250 MG tablet As directed (Patient not taking: Reported on 01/21/2021) 6 tablet 0   diltiazem (CARDIZEM) 60 MG tablet TAKE 1 TABLET BY MOUTH TWICE DAILY. (Patient not taking: Reported on 01/21/2021) 60 tablet 5   No facility-administered medications prior to visit.    ROS: Review of Systems  Constitutional:  Negative for appetite change, fatigue and unexpected weight change.  HENT:  Negative for congestion, nosebleeds, sneezing, sore throat and trouble swallowing.   Eyes:  Negative for itching and visual disturbance.   Respiratory:  Negative for cough.   Cardiovascular:  Negative for chest pain, palpitations and leg swelling.  Gastrointestinal:  Negative for abdominal distention, blood in stool, diarrhea and nausea.  Genitourinary:  Positive for difficulty urinating and frequency. Negative for hematuria.  Musculoskeletal:  Positive for gait problem. Negative for back pain, joint swelling and neck pain.  Skin:  Negative for rash.  Neurological:  Negative for dizziness, tremors, speech difficulty and weakness.  Psychiatric/Behavioral:  Negative for agitation, dysphoric mood and sleep disturbance. The patient is not nervous/anxious.    Objective:  BP 118/70 (BP Location: Left Arm)   Pulse 62   Temp 98.3 F (36.8 C) (Oral)   Ht 5\' 9"  (1.753 m)   Wt 160 lb 6.4 oz (72.8 kg)   SpO2 97%   BMI 23.69 kg/m   BP Readings from Last 3 Encounters:  01/21/21 118/70  01/07/21 104/68  12/31/20 121/68    Wt Readings from Last 3 Encounters:  01/21/21 160 lb 6.4 oz (72.8 kg)  01/07/21 163 lb (73.9 kg)  12/31/20 165 lb (74.8 kg)    Physical Exam Constitutional:      General: He is not in acute distress.    Appearance: He is well-developed.     Comments: NAD  Eyes:     Conjunctiva/sclera: Conjunctivae normal.     Pupils: Pupils are equal,  round, and reactive to light.  Neck:     Thyroid: No thyromegaly.     Vascular: No JVD.  Cardiovascular:     Rate and Rhythm: Normal rate and regular rhythm.     Heart sounds: Normal heart sounds. No murmur heard.   No friction rub. No gallop.  Pulmonary:     Effort: Pulmonary effort is normal. No respiratory distress.     Breath sounds: Normal breath sounds. No wheezing or rales.  Chest:     Chest wall: No tenderness.  Abdominal:     General: Bowel sounds are normal. There is no distension.     Palpations: Abdomen is soft. There is no mass.     Tenderness: no abdominal tenderness There is no guarding or rebound.  Musculoskeletal:        General: No tenderness.  Normal range of motion.     Cervical back: Normal range of motion.  Lymphadenopathy:     Cervical: No cervical adenopathy.  Skin:    General: Skin is warm and dry.     Findings: No rash.  Neurological:     Mental Status: He is alert and oriented to person, place, and time.     Cranial Nerves: No cranial nerve deficit.     Motor: No abnormal muscle tone.     Coordination: Coordination normal.     Gait: Gait normal.     Deep Tendon Reflexes: Reflexes are normal and symmetric.  Psychiatric:        Behavior: Behavior normal.        Thought Content: Thought content normal.        Judgment: Judgment normal.   Ataxic a little Using a cane Lab Results  Component Value Date   WBC 6.8 12/31/2020   HGB 12.0 (L) 12/31/2020   HCT 35.0 (L) 12/31/2020   PLT 120.0 (L) 12/31/2020   GLUCOSE 115 (H) 12/31/2020   CHOL 147 08/26/2019   TRIG 180.0 (H) 08/26/2019   HDL 41.80 08/26/2019   LDLDIRECT 135.0 02/24/2017   LDLCALC 70 08/26/2019   ALT 21 12/31/2020   AST 55 (H) 12/31/2020   NA 140 12/31/2020   K 3.9 12/31/2020   CL 103 12/31/2020   CREATININE 1.16 12/31/2020   BUN 21 12/31/2020   CO2 31 12/31/2020   TSH 1.24 09/05/2019   PSA 1.75 02/24/2017   INR 1.2 04/17/2019   HGBA1C 6.6 (H) 05/29/2019    CT Chest Wo Contrast  Result Date: 12/11/2019 CLINICAL DATA:  Persistent cough, sometimes productive. History of COVID infection in February. EXAM: CT CHEST WITHOUT CONTRAST TECHNIQUE: Multidetector CT imaging of the chest was performed following the standard protocol without IV contrast. COMPARISON:  None. FINDINGS: Cardiovascular: Atherosclerotic calcification of the aorta, aortic valve and coronary arteries. Distal transverse aorta is mildly ectatic, 3.1 cm. Heart is enlarged. No pericardial effusion. Mediastinum/Nodes: Mediastinal lymph nodes measure up to 11 mm in the low right paratracheal station. Hilar regions are difficult to evaluate without IV contrast. No axillary adenopathy.  Esophagus is unremarkable. Lungs/Pleura: Biapical pleuroparenchymal scarring. Centrilobular and paraseptal emphysema. Peripheral subpleural reticular densities and ground-glass with traction bronchiectasis/bronchiolectasis. Difficult to differentiate paraseptal emphysema from honeycombing, especially in the left upper lobe. No pleural fluid. Minimal debris in the airway. Upper Abdomen: Visualized portions of the liver, gallbladder, adrenal glands, right kidney, spleen, pancreas, stomach and bowel are grossly unremarkable. Musculoskeletal: Degenerative changes in the spine. No worrisome lytic or sclerotic lesions. IMPRESSION: 1. Pulmonary parenchymal pattern of fibrosis can be seen  with usual interstitial pneumonitis or nonspecific interstitial pneumonitis. 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 2. Borderline enlarged mediastinal lymph nodes be seen in the setting of interstitial lung disease. Difficult to definitively exclude a lymphoproliferative disorder. 3. Aortic atherosclerosis (ICD10-I70.0). Coronary artery calcification. 4.  Emphysema (ICD10-J43.9). Electronically Signed   By: Lorin Picket M.D.   On: 12/11/2019 08:04    Assessment & Plan:   Problem List Items Addressed This Visit     Bladder neck obstruction    Worse Start Proscar      Dyslipidemia    Cont on Atorvastatin      Fatigue    Doing better      Hemiparesis affecting left side as late effect of stroke ([place])    Doing better, doing more      Paranoid ideation ([place])    Much better  Cont w/Seroquel         Meds ordered this encounter  Medications   finasteride (PROSCAR) 5 MG tablet    Sig: Take 1 tablet (5 mg total) by mouth daily.    Dispense:  90 tablet    Refill:  3       Follow-up: No follow-ups on file.  Walker Kehr, MD

## 2021-01-21 NOTE — Assessment & Plan Note (Signed)
Doing better.   

## 2021-01-21 NOTE — Assessment & Plan Note (Signed)
Doing better, doing more

## 2021-01-27 DIAGNOSIS — M9905 Segmental and somatic dysfunction of pelvic region: Secondary | ICD-10-CM | POA: Diagnosis not present

## 2021-01-27 DIAGNOSIS — M9901 Segmental and somatic dysfunction of cervical region: Secondary | ICD-10-CM | POA: Diagnosis not present

## 2021-01-27 DIAGNOSIS — M9904 Segmental and somatic dysfunction of sacral region: Secondary | ICD-10-CM | POA: Diagnosis not present

## 2021-01-27 DIAGNOSIS — M5388 Other specified dorsopathies, sacral and sacrococcygeal region: Secondary | ICD-10-CM | POA: Diagnosis not present

## 2021-01-27 DIAGNOSIS — M47816 Spondylosis without myelopathy or radiculopathy, lumbar region: Secondary | ICD-10-CM | POA: Diagnosis not present

## 2021-01-27 DIAGNOSIS — M47817 Spondylosis without myelopathy or radiculopathy, lumbosacral region: Secondary | ICD-10-CM | POA: Diagnosis not present

## 2021-01-27 DIAGNOSIS — M9903 Segmental and somatic dysfunction of lumbar region: Secondary | ICD-10-CM | POA: Diagnosis not present

## 2021-01-27 DIAGNOSIS — M47812 Spondylosis without myelopathy or radiculopathy, cervical region: Secondary | ICD-10-CM | POA: Diagnosis not present

## 2021-02-02 ENCOUNTER — Encounter: Payer: Medicare HMO | Attending: Physical Medicine & Rehabilitation | Admitting: Physical Medicine & Rehabilitation

## 2021-02-02 ENCOUNTER — Other Ambulatory Visit: Payer: Self-pay

## 2021-02-02 ENCOUNTER — Encounter: Payer: Self-pay | Admitting: Physical Medicine & Rehabilitation

## 2021-02-02 ENCOUNTER — Encounter: Payer: Self-pay | Admitting: Internal Medicine

## 2021-02-02 VITALS — Ht 69.0 in | Wt 157.0 lb

## 2021-02-02 DIAGNOSIS — G8114 Spastic hemiplegia affecting left nondominant side: Secondary | ICD-10-CM | POA: Diagnosis not present

## 2021-02-02 DIAGNOSIS — G8110 Spastic hemiplegia affecting unspecified side: Secondary | ICD-10-CM | POA: Diagnosis not present

## 2021-02-02 NOTE — Progress Notes (Signed)
Subjective:    Patient ID: Henry [name], male    DOB: Feb 24, 1941, 80 y.o.   MRN: [id number]  This visit occurred during the SARS-CoV-2 public health emergency.  Safety protocols were in place, including screening questions prior to the visit, additional usage of staff PPE, and extensive cleaning of exam room while observing appropriate contact time as indicated for disinfecting solutions.    HPI The patient is here for an acute visit.  He is here with his wife.  Hip pain, hard to walk -   Left hip pain - has been present for about one month.  He points to the lateral left hip and states that is where the pain is.  It has gotten worse recently.  Pain in lateral hip.  When walking and putting weight on it - it hurts. No radiation down leg.  Has chronic back pain  - no change.  He has pain from a compression fracture.  The heating pad helps and tylenol helps.    Leg cramping at night in feet and calves.  It happens most nights.  This is relatively new-he thinks it started about a month ago as well.  He has to get up to get relief.  He drinks a little water during the day.   No leg pain with walking.  He wondered if he had an injection in the hip.  He has an appointment with Dr. Ellene Route 12/23.   Medications and allergies reviewed with patient and updated if appropriate.  Patient Active Problem List   Diagnosis Date Noted   Bladder neck obstruction 01/21/2021   Fall against object 12/31/2020   Fatigue 12/31/2020   Neoplasm of uncertain behavior of skin 12/31/2020   History of cardioembolic cerebrovascular accident (CVA) 08/31/2020   Atherosclerosis of aorta ([place]) 06/22/2020   Upper respiratory disease 03/30/2020   Paranoid ideation ([place]) 09/10/2019   Confusion 09/05/2019   Memory loss 09/05/2019   Hemiparesis affecting left side as late effect of stroke ([place]) 09/05/2019   DVT (deep venous thrombosis) ([place]) 08/26/2019   Carotid stenosis, asymptomatic, bilateral 08/26/2019    Intertrigo 08/26/2019   Constipation 08/08/2019   Vertigo 06/27/2019   Dyslipidemia 05/29/2019   Left shoulder pain 05/19/2019   History of embolic stroke 16/96/7893   Acute respiratory failure (HCC)    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   Chest wall pain 07/04/2016   Lumbar stenosis with neurogenic claudication 05/20/2016   BPH (benign prostatic hyperplasia) 12/01/2015   Heel pain 05/13/2015   Inguinal hernia 03/19/2014   Left hip pain 01/10/2014   HTN (hypertension) 01/10/2014   Well adult exam 09/12/2011   Personal history of colonic polyps 06/07/2010   GERD 03/16/2010   Cough 08/11/2009   POLYCYTHEMIA 07/01/2009   Osteoarthritis 04/17/2007   LOW BACK PAIN 04/17/2007   LEG PAIN 04/17/2007   PARESTHESIA 04/17/2007    Current Outpatient Medications on File Prior to Visit  Medication Sig Dispense Refill   acetaminophen (TYLENOL) 650 MG CR tablet Take 650 mg by mouth every 6 (six) hours as needed for pain.     aspirin EC 81 MG tablet Take 1 tablet (81 mg total) by mouth daily. Swallow whole. 30 tablet 11   atorvastatin (LIPITOR) 40 MG tablet TAKE ONE (1) TABLET BY MOUTH EVERY DAY AT 6PM. 30 tablet 5   B Complex-Folic Acid (B COMPLEX PLUS) TABS Take 1 tablet by mouth daily. 100 tablet 3   celecoxib (CELEBREX) 200 MG capsule One po qd or BID prn  pain 60 capsule 0   Cholecalciferol (VITAMIN D3) 50 MCG (2000 UT) capsule Take 1 capsule (2,000 Units total) by mouth daily. 100 capsule 3   donepezil (ARICEPT) 10 MG tablet Take 1 tablet (10 mg total) by mouth at bedtime. 90 tablet 3   finasteride (PROSCAR) 5 MG tablet Take 1 tablet (5 mg total) by mouth daily. 90 tablet 3   memantine (NAMENDA) 10 MG tablet TAKE 1 TABLET BY MOUTH TWICE DAILY. 60 tablet 5   Misc Natural Products (PROSTATE HEALTH) CAPS Take 1 capsule by mouth daily.     QUEtiapine (SEROQUEL) 25 MG tablet TAKE 1/2 TABLET BY MOUTH IN THE MORNING AND TAKE 2 TABLETS AT NIGHT (Patient taking differently: Take 25 mg by mouth at  bedtime.) 90 tablet 3   No current facility-administered medications on file prior to visit.    Past Medical History:  Diagnosis Date   Colon polyps    COVID-19    Elevated MCV 2011   GERD (gastroesophageal reflux disease) 2012   LBP (low back pain)    MRI L spine 06/2007: L L4 stenosis, 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   Osteoarthritis    Polycythemia 2011    Past Surgical History:  Procedure Laterality Date   APPENDECTOMY     COLONOSCOPY     HERNIA REPAIR     march 2019   INGUINAL HERNIA REPAIR Left 2015   IR CT HEAD LTD  04/17/2019   IR PERCUTANEOUS ART THROMBECTOMY/INFUSION INTRACRANIAL INC DIAG ANGIO  04/17/2019   LUMBAR LAMINECTOMY/DECOMPRESSION MICRODISCECTOMY N/A 05/20/2016   Procedure: Lumbar Three-Four,Lunmbar Four-Five,Lumbar Five-Sacral One Laminectomy/Foraminotomy;  Surgeon: Kristeen Miss, MD;  Location: [place];  Service: Neurosurgery;  Laterality: N/A;   POLYPECTOMY     RADIOLOGY WITH ANESTHESIA N/A 04/17/2019   Procedure: IR WITH ANESTHESIA;  Surgeon: Luanne Bras, MD;  Location: [place];  Service: Radiology;  Laterality: N/A;    Social History   Socioeconomic History   Marital status: Married    Spouse name: Wilma   Number of children: 3   Years of education: 12   Highest education level: Not on file  Occupational History   Occupation: Retired    Fish farm manager: RETIRED  Tobacco Use   Smoking status: Former    Packs/day: 1.00    Types: Cigarettes    Quit date: 05/24/1976    Years since quitting: 44.7   Smokeless tobacco: Never  Vaping Use   Vaping Use: Never used  Substance and Sexual Activity   Alcohol use: No   Drug use: No   Sexual activity: Not Currently  Other Topics Concern   Not on file  Social History Narrative   Not on file   Social Determinants of Health   Financial Resource Strain: Not on file  Food Insecurity: Not on file  Transportation Needs: Not on file  Physical Activity: Not on file  Stress: Not on file  Social Connections: Not on  file    Family History  Problem Relation Age of Onset   Colon cancer Sister 51   Breast cancer Other        1st degree relative   Stroke Neg Hx    Esophageal cancer Neg Hx    Pancreatic cancer Neg Hx    Prostate cancer Neg Hx    Rectal cancer Neg Hx    Stomach cancer Neg Hx     Review of Systems     Objective:   Vitals:   02/03/21 0936  BP: 128/80  Pulse: 67  Temp: 98.1 F (36.7 C)  SpO2: 96%   BP Readings from Last 3 Encounters:  02/03/21 128/80  01/21/21 118/70  01/07/21 104/68   Wt Readings from Last 3 Encounters:  02/03/21 160 lb 12.8 oz (72.9 kg)  02/02/21 157 lb (71.2 kg)  01/21/21 160 lb 6.4 oz (72.8 kg)   Body mass index is 23.75 kg/m.   Physical Exam Constitutional:      General: He is not in acute distress.    Appearance: Normal appearance. He is not ill-appearing.  HENT:     Head: Normocephalic and atraumatic.  Musculoskeletal:     Right lower leg: No edema.     Left lower leg: No edema.     Comments: No lumbar spine tenderness or left lower back tenderness with palpation.  No tenderness to palpation left lateral hip/trochanter  Skin:    General: Skin is warm and dry.     Findings: No erythema or rash.  Neurological:     Mental Status: He is alert.     Sensory: No sensory deficit.     Gait: Gait abnormal (Related to previous stroke).       DG Lumbar Spine 2-3 Views CLINICAL DATA:  Low back pain post fall 2-3 days ago.  EXAM: LUMBAR SPINE - 2-3 VIEW  COMPARISON:  05/20/2016  FINDINGS: Examination demonstrates normal vertebral body alignment. There is moderate to severe spondylosis throughout the lumbar spine. Minimal disc space narrowing at the L3-4 and L4-5 levels. There is a mild compression fracture of L2 age indeterminate but new since 2018.  IMPRESSION: 1. Mild L2 compression fracture, age indeterminate, but new since 2018. 2. 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  Electronically Signed   By: Marin Olp M.D.   On: 01/02/2021 08:45     Assessment & Plan:     Spinal stenosis lumbar region, lumbar radiculopathy, muscle cramping: Acute on chronic Has a history of spinal stenosis and had surgery several years ago 1 month ago started experiencing lateral left hip pain with standing and walking and bilateral calf and feet cramping at night No changes in medication, diet or water intake Has a new L2 compression fracture and sees Dr. Ellene Route in his next month I think his symptoms are related to his lumbar spine We will order an MRI of his lumbar spine, which hopefully he will get done before he sees Dr. Ellene Route He is able to manage his pain with Tylenol-I advised he can take up to 3000 mg in 1 day if needed Continue using heat pad He will call if his pain is not controlled or any other concerns

## 2021-02-02 NOTE — Progress Notes (Signed)
Dysport Injection for spasticity using needle EMG guidance  Dilution: 200 Units/ml Indication: Severe spasticity which interferes with ADL,mobility and/or  hygiene and is unresponsive to medication management and other conservative care Informed consent was obtained after describing risks and benefits of the procedure with the patient. This includes bleeding, bruising, infection, excessive weakness, or medication side effects. A REMS form is on file and signed. Needle: 27g 1" needle electrode Number of units per muscle left pectoralis 100U 0-1+ Left biceps 100U Lower medial 2+, remainder 0-1+ Left FCR 50U 2-3+ Left FDS 25U 1-2+ Left FPL 25U 0-1+ All injections were done after obtaining appropriate EMG activity and after negative drawback for blood. The patient tolerated the procedure well. Post procedure instructions were given. A followup appointment was made.   Consider pronator

## 2021-02-02 NOTE — Patient Instructions (Signed)

## 2021-02-03 ENCOUNTER — Ambulatory Visit (INDEPENDENT_AMBULATORY_CARE_PROVIDER_SITE_OTHER): Payer: Medicare HMO | Admitting: Internal Medicine

## 2021-02-03 VITALS — BP 128/80 | HR 67 | Temp 98.1°F | Ht 69.0 in | Wt 160.8 lb

## 2021-02-03 DIAGNOSIS — M5416 Radiculopathy, lumbar region: Secondary | ICD-10-CM

## 2021-02-03 DIAGNOSIS — R252 Cramp and spasm: Secondary | ICD-10-CM

## 2021-02-03 DIAGNOSIS — M48061 Spinal stenosis, lumbar region without neurogenic claudication: Secondary | ICD-10-CM

## 2021-02-03 NOTE — Patient Instructions (Signed)
    You can take 3000 mg of tylenol a day safely.      An MRI of your lower back was ordered.    Someone from their office will call you to schedule an appointment.

## 2021-02-22 ENCOUNTER — Other Ambulatory Visit: Payer: Self-pay

## 2021-02-22 ENCOUNTER — Ambulatory Visit
Admission: RE | Admit: 2021-02-22 | Discharge: 2021-02-22 | Disposition: A | Discharge status: Home / Self Care | Payer: Medicare HMO | Source: Ambulatory Visit | Attending: Internal Medicine | Admitting: Internal Medicine

## 2021-02-22 DIAGNOSIS — M48061 Spinal stenosis, lumbar region without neurogenic claudication: Secondary | ICD-10-CM

## 2021-02-22 DIAGNOSIS — M5416 Radiculopathy, lumbar region: Secondary | ICD-10-CM

## 2021-02-22 DIAGNOSIS — S32020A Wedge compression fracture of second lumbar vertebra, initial encounter for closed fracture: Secondary | ICD-10-CM | POA: Diagnosis not present

## 2021-02-22 DIAGNOSIS — M4316 Spondylolisthesis, lumbar region: Secondary | ICD-10-CM | POA: Diagnosis not present

## 2021-02-26 DIAGNOSIS — M5416 Radiculopathy, lumbar region: Secondary | ICD-10-CM | POA: Diagnosis not present

## 2021-03-10 ENCOUNTER — Other Ambulatory Visit: Payer: Self-pay | Admitting: Neurological Surgery

## 2021-03-16 ENCOUNTER — Other Ambulatory Visit: Payer: Self-pay

## 2021-03-16 ENCOUNTER — Encounter: Payer: Self-pay | Admitting: Physical Medicine & Rehabilitation

## 2021-03-16 ENCOUNTER — Ambulatory Visit: Payer: Medicare HMO | Admitting: Physical Medicine & Rehabilitation

## 2021-03-16 ENCOUNTER — Encounter: Payer: Medicare HMO | Attending: Physical Medicine & Rehabilitation | Admitting: Physical Medicine & Rehabilitation

## 2021-03-16 VITALS — BP 108/72 | HR 110 | Ht 69.0 in | Wt 155.0 lb

## 2021-03-16 DIAGNOSIS — G8110 Spastic hemiplegia affecting unspecified side: Secondary | ICD-10-CM | POA: Insufficient documentation

## 2021-03-16 DIAGNOSIS — L570 Actinic keratosis: Secondary | ICD-10-CM | POA: Diagnosis not present

## 2021-03-16 NOTE — Progress Notes (Signed)
Subjective:    Patient ID: Henry [name], male    DOB: 1940/07/18, 81 y.o.   MRN: [id number]  HPI  81 year old male with left spastic hemiparesis who returns today in follow-up after botulinum toxin injection performed on 02/02/2021.  The patient states he got some improvement in the tightness of the left upper extremity postinjection although he still has difficulty extending his fingers.  In the interval time he has developed increasing low back pain radiating into the lower extremities and is scheduled to undergo L3-L4 laminectomy by Dr. Ellene Route from neurosurgery on 03/22/2021.   left pectoralis 100U 0-1+ Left biceps 100U Lower medial 2+, remainder 0-1+ Left FCR 50U 2-3+ Left FDS 25U 1-2+ Left FPL 25U 0-1+ Pain Inventory Average Pain 5 Pain Right Now 5 My pain is aching  In the last 24 hours, has pain interfered with the following? General activity 5 Relation with others 5 Enjoyment of life 3 What TIME of day is your pain at its worst? evening Sleep (in general) Fair  Pain is worse with: standing Pain improves with: heat/ice Relief from Meds:  na  Family History  Problem Relation Age of Onset   Colon cancer Sister 52   Breast cancer Other        1st degree relative   Stroke Neg Hx    Esophageal cancer Neg Hx    Pancreatic cancer Neg Hx    Prostate cancer Neg Hx    Rectal cancer Neg Hx    Stomach cancer Neg Hx    Social History   Socioeconomic History   Marital status: Married    Spouse name: Wilma   Number of children: 3   Years of education: 12   Highest education level: Not on file  Occupational History   Occupation: Retired    Fish farm manager: RETIRED  Tobacco Use   Smoking status: Former    Packs/day: 1.00    Types: Cigarettes    Quit date: 05/24/1976    Years since quitting: 44.8   Smokeless tobacco: Never  Vaping Use   Vaping Use: Never used  Substance and Sexual Activity   Alcohol use: No   Drug use: No   Sexual activity: Not Currently  Other  Topics Concern   Not on file  Social History Narrative   Not on file   Social Determinants of Health   Financial Resource Strain: Not on file  Food Insecurity: Not on file  Transportation Needs: Not on file  Physical Activity: Not on file  Stress: Not on file  Social Connections: Not on file   Past Surgical History:  Procedure Laterality Date   APPENDECTOMY     COLONOSCOPY     HERNIA REPAIR     march 2019   INGUINAL HERNIA REPAIR Left 2015   IR CT HEAD LTD  04/17/2019   IR PERCUTANEOUS ART THROMBECTOMY/INFUSION INTRACRANIAL INC DIAG ANGIO  04/17/2019   LUMBAR LAMINECTOMY/DECOMPRESSION MICRODISCECTOMY N/A 05/20/2016   Procedure: Lumbar Three-Four,Lunmbar Four-Five,Lumbar Five-Sacral One Laminectomy/Foraminotomy;  Surgeon: Kristeen Miss, MD;  Location: [place];  Service: Neurosurgery;  Laterality: N/A;   POLYPECTOMY     RADIOLOGY WITH ANESTHESIA N/A 04/17/2019   Procedure: IR WITH ANESTHESIA;  Surgeon: Luanne Bras, MD;  Location: [place];  Service: Radiology;  Laterality: N/A;   Past Surgical History:  Procedure Laterality Date   APPENDECTOMY     COLONOSCOPY     HERNIA REPAIR     march 2019   INGUINAL HERNIA REPAIR Left 2015   IR  CT HEAD LTD  04/17/2019   IR PERCUTANEOUS ART THROMBECTOMY/INFUSION INTRACRANIAL INC DIAG ANGIO  04/17/2019   LUMBAR LAMINECTOMY/DECOMPRESSION MICRODISCECTOMY N/A 05/20/2016   Procedure: Lumbar Three-Four,Lunmbar Four-Five,Lumbar Five-Sacral One Laminectomy/Foraminotomy;  Surgeon: Kristeen Miss, MD;  Location: [place];  Service: Neurosurgery;  Laterality: N/A;   POLYPECTOMY     RADIOLOGY WITH ANESTHESIA N/A 04/17/2019   Procedure: IR WITH ANESTHESIA;  Surgeon: Luanne Bras, MD;  Location: [place];  Service: Radiology;  Laterality: N/A;   Past Medical History:  Diagnosis Date   Colon polyps    COVID-19    Elevated MCV 2011   GERD (gastroesophageal reflux disease) 2012   LBP (low back pain)    MRI L spine 06/2007: L L4 stenosis, central stenosis  L4-5.L5-S1   Osteoarthritis    Polycythemia 2011   BP 108/72    Pulse (!) 110    Ht 5\' 9"  (1.753 m)    Wt 155 lb (70.3 kg)    SpO2 93%    BMI 22.89 kg/m   Opioid Risk Score:   Fall Risk Score:  `1  Depression screen PHQ 2/9  Depression screen [facility] 2/9 02/02/2021 12/01/2020 08/18/2020 09/18/2019 02/26/2018 02/24/2017 04/14/2015  Decreased Interest 0 1 3 0 0 0 0  Down, Depressed, Hopeless 0 1 2 0 0 0 0  PHQ - 2 Score 0 2 5 0 0 0 0  Altered sleeping - - 0 - - - -  Tired, decreased energy - - 1 - - - -  Change in appetite - - 0 - - - -  Feeling bad or failure about yourself  - - 1 - - - -  Trouble concentrating - - 0 - - - -  Moving slowly or fidgety/restless - - 1 - - - -  Suicidal thoughts - - 0 - - - -  PHQ-9 Score - - 8 - - - -  Difficult doing work/chores - - Somewhat difficult - - - -     Review of Systems     Objective:   Physical Exam Vitals reviewed.  Constitutional:      Appearance: Normal appearance.  HENT:     Head: Normocephalic and atraumatic.  Eyes:     Extraocular Movements: Extraocular movements intact.     Conjunctiva/sclera: Conjunctivae normal.     Pupils: Pupils are equal, round, and reactive to light.  Musculoskeletal:     Comments: The patient has incomplete active extension of his fingers on the left side he can be stretched to neutral.  Also his wrist can go into dorsiflexion and elbow can go into full extension passively.  Skin:    General: Skin is warm and dry.  Neurological:     Mental Status: He is alert and oriented to person, place, and time.  Psychiatric:        Mood and Affect: Mood normal.        Behavior: Behavior normal.   Tone Elbow flexors MAS 2 Wrist flexor MAS 2 Finger flexors MAS 2 Thumb flexor MAS 2  Motor strength Finger extension 3 - at the digits 234 and 3 at digit 5 Finger flexion is 4 -/5 Elbow flexion 3 -/5 Elbow extension 3 -/5        Assessment & Plan:  #1.  Spastic hemiplegia mainly affecting the upper  extremity on the left side got partial relief with Xeomin 300 units he would benefit from repeat injection with higher dosing.,  Will repeat first week of March Xeomin 400  units left bicep 100 units, medial Left pronator 50 units Left FDS 50 units Left FDP 50 units Left FPL 50 units Left FCR 50 units

## 2021-03-17 DIAGNOSIS — M5416 Radiculopathy, lumbar region: Secondary | ICD-10-CM | POA: Diagnosis not present

## 2021-03-17 NOTE — Progress Notes (Signed)
Surgical Instructions    Your procedure is scheduled on Monday 03/22/21.   Report to [facility] Main Entrance "A" at 12:45 P.M., then check in with the Admitting office.  Call this number if you have problems the morning of surgery:  [phone]   If you have any questions prior to your surgery date call [phone]: Open Monday-Friday 8am-4pm    Remember:  Do not eat after midnight the night before your surgery  You may drink clear liquids until 11:45 A.M. the morning of your surgery.   Clear liquids allowed are: Water, Non-Citrus Juices (without pulp), Carbonated Beverages, Clear Tea, Black Coffee Only, and Gatorade    Take these medicines the morning of surgery with A SIP OF WATER   atorvastatin (LIPITOR)  finasteride (PROSCAR)  memantine (NAMENDA)    Take these medicines if needed:   acetaminophen (TYLENOL)   traMADol (ULTRAM)  As of today, STOP taking any Aspirin (unless otherwise instructed by your surgeon) Aleve, Naproxen, Ibuprofen, Motrin, Advil, Goody's, BC's, all herbal medications, fish oil, and all vitamins.                     Do NOT Smoke (Tobacco/Vaping) or drink Alcohol 24 hours prior to your procedure.  If you use a CPAP at night, you may bring all equipment for your overnight stay.   Contacts, glasses, piercing's, hearing aid's, dentures or partials may not be worn into surgery, please bring cases for these belongings.    For patients admitted to the hospital, discharge time will be determined by your treatment team.   Patients discharged the day of surgery will not be allowed to drive home, and someone needs to stay with them for 24 hours.  NO VISITORS WILL BE ALLOWED IN PRE-OP WHERE PATIENTS GET READY FOR SURGERY.  ONLY 1 SUPPORT PERSON MAY BE PRESENT IN THE WAITING ROOM WHILE YOU ARE IN SURGERY.  IF YOU ARE TO BE ADMITTED, ONCE YOU ARE IN YOUR ROOM YOU WILL BE ALLOWED TWO (2) VISITORS.  Minor children may have two parents present. Special consideration  for safety and communication needs will be reviewed on a case by case basis.   Special instructions:   [place]- Preparing For Surgery  Before surgery, you can play an important role. Because skin is not sterile, your skin needs to be as free of germs as possible. You can reduce the number of germs on your skin by washing with CHG (chlorahexidine gluconate) Soap before surgery.  CHG is an antiseptic cleaner which kills germs and bonds with the skin to continue killing germs even after washing.    Oral Hygiene is also important to reduce your risk of infection.  Remember - BRUSH YOUR TEETH THE MORNING OF SURGERY WITH YOUR REGULAR TOOTHPASTE  Please do not use if you have an allergy to CHG or antibacterial soaps. If your skin becomes reddened/irritated stop using the CHG.  Do not shave (including legs and underarms) for at least 48 hours prior to first CHG shower. It is OK to shave your face.  Please follow these instructions carefully.   Shower the NIGHT BEFORE SURGERY and the MORNING OF SURGERY  If you chose to wash your hair, wash your hair first as usual with your normal shampoo.  After you shampoo, rinse your hair and body thoroughly to remove the shampoo.  Use CHG Soap as you would any other liquid soap. You can apply CHG directly to the skin and wash gently with a scrungie  or a clean washcloth.   Apply the CHG Soap to your body ONLY FROM THE NECK DOWN.  Do not use on open wounds or open sores. Avoid contact with your eyes, ears, mouth and genitals (private parts). Wash Face and genitals (private parts)  with your normal soap.   Wash thoroughly, paying special attention to the area where your surgery will be performed.  Thoroughly rinse your body with warm water from the neck down.  DO NOT shower/wash with your normal soap after using and rinsing off the CHG Soap.  Pat yourself dry with a CLEAN TOWEL.  Wear CLEAN PAJAMAS to bed the night before surgery  Place CLEAN SHEETS on  your bed the night before your surgery  DO NOT SLEEP WITH PETS.   Day of Surgery: Shower with CHG soap. Do not wear jewelry, make up, nail polish, gel polish, artificial nails, or any other type of covering on natural nails including finger and toenails. If patients have artificial nails, gel coating, etc. that need to be removed by a nail salon please have this removed prior to surgery. Surgery may need to be canceled/delayed if the surgeon/ anesthesia feels like the patient is unable to be adequately monitored. Do not wear lotions, powders, perfumes/colognes, or deodorant. Do not shave 48 hours prior to surgery.  Men may shave face and neck. Do not bring valuables to the hospital. [facility] is not responsible for any belongings or valuables. Wear Clean/Comfortable clothing the morning of surgery Remember to brush your teeth WITH YOUR REGULAR TOOTHPASTE.   Please read over the following fact sheets that you were given.   3 days prior to your procedure or After your COVID test   You are not required to quarantine however you are required to wear a well-fitting mask when you are out and around people not in your household. If your mask becomes wet or soiled, replace with a new one.   Wash your hands often with soap and water for 20 seconds or clean your hands with an alcohol-based hand sanitizer that contains at least 60% alcohol.   Do not share personal items.   Notify your provider:  o if you are in close contact with someone who has COVID  o or if you develop a fever of 100.4 or greater, sneezing, cough, sore throat, shortness of breath or body aches.

## 2021-03-18 ENCOUNTER — Encounter (HOSPITAL_COMMUNITY): Payer: Self-pay

## 2021-03-18 ENCOUNTER — Other Ambulatory Visit: Payer: Self-pay

## 2021-03-18 ENCOUNTER — Encounter (HOSPITAL_COMMUNITY)
Admission: RE | Admit: 2021-03-18 | Discharge: 2021-03-18 | Disposition: A | Discharge status: Home / Self Care | Payer: Medicare HMO | Source: Ambulatory Visit | Attending: Neurological Surgery | Admitting: Neurological Surgery

## 2021-03-18 VITALS — BP 130/85 | HR 115 | Temp 99.0°F | Resp 18 | Ht 68.0 in | Wt 155.0 lb

## 2021-03-18 DIAGNOSIS — Z01812 Encounter for preprocedural laboratory examination: Secondary | ICD-10-CM | POA: Diagnosis not present

## 2021-03-18 DIAGNOSIS — Z20822 Contact with and (suspected) exposure to covid-19: Secondary | ICD-10-CM | POA: Insufficient documentation

## 2021-03-18 DIAGNOSIS — Z01818 Encounter for other preprocedural examination: Secondary | ICD-10-CM

## 2021-03-18 HISTORY — DX: Pneumonia, unspecified organism: J18.9

## 2021-03-18 HISTORY — DX: Cerebral infarction, unspecified: I63.9

## 2021-03-18 LAB — CBC
HCT: 33.8 % — ABNORMAL LOW (ref 39.0–52.0)
Hemoglobin: 11 g/dL — ABNORMAL LOW (ref 13.0–17.0)
MCH: 38.2 pg — ABNORMAL HIGH (ref 26.0–34.0)
MCHC: 32.5 g/dL (ref 30.0–36.0)
MCV: 117.4 fL — ABNORMAL HIGH (ref 80.0–100.0)
Platelets: 110 10*3/uL — ABNORMAL LOW (ref 150–400)
RBC: 2.88 MIL/uL — ABNORMAL LOW (ref 4.22–5.81)
RDW: 14.3 % (ref 11.5–15.5)
WBC: 5.1 10*3/uL (ref 4.0–10.5)
nRBC: 0 % (ref 0.0–0.2)

## 2021-03-18 LAB — SURGICAL PCR SCREEN
MRSA, PCR: NEGATIVE
Staphylococcus aureus: NEGATIVE

## 2021-03-18 LAB — SARS CORONAVIRUS 2 (TAT 6-24 HRS): SARS Coronavirus 2: NEGATIVE

## 2021-03-18 NOTE — Progress Notes (Signed)
PCP - Dr. Lew Dawes Cardiologist - Denies  PPM/ICD - Denies  Chest x-ray - N/A EKG - N/A Stress Test - Denies ECHO - 04/17/19 Cardiac Cath - Denies  Sleep Study - Denies  Diabetic: Denies  Blood Thinner Instructions: N/A Aspirin Instructions: Follow surgeon's instructions  ERAS Protcol - Yes PRE-SURGERY Ensure or G2- No  COVID TEST- 03/18/21 in PAT   Anesthesia review: No  Patient denies shortness of breath, fever, cough and chest pain at PAT appointment   All instructions explained to the patient, with a verbal understanding of the material. Patient agrees to go over the instructions while at home for a better understanding. Patient also instructed to self quarantine after being tested for COVID-19. The opportunity to ask questions was provided.

## 2021-03-22 ENCOUNTER — Ambulatory Visit (HOSPITAL_COMMUNITY): Payer: Medicare HMO | Admitting: Physician Assistant

## 2021-03-22 ENCOUNTER — Encounter (HOSPITAL_COMMUNITY): Payer: Self-pay | Admitting: Neurological Surgery

## 2021-03-22 ENCOUNTER — Ambulatory Visit (HOSPITAL_COMMUNITY): Payer: Medicare HMO | Admitting: Certified Registered"

## 2021-03-22 ENCOUNTER — Other Ambulatory Visit: Payer: Self-pay

## 2021-03-22 ENCOUNTER — Observation Stay (HOSPITAL_COMMUNITY)
Admission: RE | Admit: 2021-03-22 | Discharge: 2021-03-23 | Disposition: A | Discharge status: Home / Self Care | Payer: Medicare HMO | Attending: Neurological Surgery | Admitting: Neurological Surgery

## 2021-03-22 ENCOUNTER — Ambulatory Visit (HOSPITAL_COMMUNITY): Payer: Medicare HMO

## 2021-03-22 ENCOUNTER — Encounter (HOSPITAL_COMMUNITY)
Admission: RE | Disposition: A | Discharge status: Home / Self Care | Payer: Self-pay | Source: Home / Self Care | Attending: Neurological Surgery

## 2021-03-22 DIAGNOSIS — G544 Lumbosacral root disorders, not elsewhere classified: Secondary | ICD-10-CM | POA: Diagnosis not present

## 2021-03-22 DIAGNOSIS — M5416 Radiculopathy, lumbar region: Secondary | ICD-10-CM | POA: Diagnosis present

## 2021-03-22 DIAGNOSIS — G96191 Perineural cyst: Secondary | ICD-10-CM | POA: Insufficient documentation

## 2021-03-22 DIAGNOSIS — M4726 Other spondylosis with radiculopathy, lumbar region: Principal | ICD-10-CM | POA: Insufficient documentation

## 2021-03-22 DIAGNOSIS — Z87891 Personal history of nicotine dependence: Secondary | ICD-10-CM | POA: Insufficient documentation

## 2021-03-22 DIAGNOSIS — Z800 Family history of malignant neoplasm of digestive organs: Secondary | ICD-10-CM | POA: Insufficient documentation

## 2021-03-22 DIAGNOSIS — Z8616 Personal history of COVID-19: Secondary | ICD-10-CM | POA: Diagnosis not present

## 2021-03-22 DIAGNOSIS — Z79899 Other long term (current) drug therapy: Secondary | ICD-10-CM | POA: Diagnosis not present

## 2021-03-22 DIAGNOSIS — Z8673 Personal history of transient ischemic attack (TIA), and cerebral infarction without residual deficits: Secondary | ICD-10-CM | POA: Insufficient documentation

## 2021-03-22 DIAGNOSIS — I10 Essential (primary) hypertension: Secondary | ICD-10-CM | POA: Diagnosis not present

## 2021-03-22 DIAGNOSIS — Z7982 Long term (current) use of aspirin: Secondary | ICD-10-CM | POA: Insufficient documentation

## 2021-03-22 DIAGNOSIS — M1990 Unspecified osteoarthritis, unspecified site: Secondary | ICD-10-CM | POA: Diagnosis not present

## 2021-03-22 DIAGNOSIS — M7138 Other bursal cyst, other site: Secondary | ICD-10-CM | POA: Diagnosis not present

## 2021-03-22 DIAGNOSIS — Z9889 Other specified postprocedural states: Secondary | ICD-10-CM | POA: Diagnosis not present

## 2021-03-22 DIAGNOSIS — Z419 Encounter for procedure for purposes other than remedying health state, unspecified: Secondary | ICD-10-CM

## 2021-03-22 HISTORY — PX: LUMBAR LAMINECTOMY/DECOMPRESSION MICRODISCECTOMY: SHX5026

## 2021-03-22 SURGERY — LUMBAR LAMINECTOMY/DECOMPRESSION MICRODISCECTOMY 1 LEVEL
Anesthesia: General | Site: Spine Lumbar | Laterality: Left

## 2021-03-22 MED ORDER — TRAMADOL HCL 50 MG PO TABS: 50.0000 mg | ORAL_TABLET | Freq: Four times a day (QID) | ORAL | Status: DC | PRN

## 2021-03-22 MED ORDER — ATORVASTATIN CALCIUM 10 MG PO TABS
20.0000 mg | ORAL_TABLET | ORAL | Status: DC
  Administered 2021-03-22: 20 mg via ORAL

## 2021-03-22 MED ORDER — POLYETHYLENE GLYCOL 3350 17 G PO PACK: 17.0000 g | PACK | Freq: Every day | ORAL | Status: DC | PRN

## 2021-03-22 MED ORDER — THROMBIN 5000 UNITS EX SOLR
CUTANEOUS | Status: AC
  Filled 2021-03-22: qty 15000

## 2021-03-22 MED ORDER — METHOCARBAMOL 500 MG PO TABS: 500.0000 mg | ORAL_TABLET | Freq: Four times a day (QID) | ORAL | Status: DC | PRN

## 2021-03-22 MED ORDER — CHLORHEXIDINE GLUCONATE CLOTH 2 % EX PADS: 6.0000 | MEDICATED_PAD | Freq: Once | CUTANEOUS | Status: DC

## 2021-03-22 MED ORDER — SUGAMMADEX SODIUM 200 MG/2ML IV SOLN
INTRAVENOUS | Status: DC | PRN
  Administered 2021-03-22: 180 mg via INTRAVENOUS

## 2021-03-22 MED ORDER — ORAL CARE MOUTH RINSE: 15.0000 mL | Freq: Once | OROMUCOSAL | Status: AC

## 2021-03-22 MED ORDER — 0.9 % SODIUM CHLORIDE (POUR BTL) OPTIME
TOPICAL | Status: DC | PRN
  Administered 2021-03-22: 1000 mL

## 2021-03-22 MED ORDER — LIDOCAINE-EPINEPHRINE 1 %-1:100000 IJ SOLN
INTRAMUSCULAR | Status: DC | PRN
  Administered 2021-03-22: 5 mL

## 2021-03-22 MED ORDER — DONEPEZIL HCL 10 MG PO TABS
10.0000 mg | ORAL_TABLET | Freq: Every day | ORAL | Status: DC
  Filled 2021-03-22 (×2): qty 1

## 2021-03-22 MED ORDER — ACETAMINOPHEN 10 MG/ML IV SOLN: 1000.0000 mg | Freq: Once | INTRAVENOUS | Status: DC | PRN

## 2021-03-22 MED ORDER — BUPIVACAINE HCL (PF) 0.5 % IJ SOLN
INTRAMUSCULAR | Status: AC
  Filled 2021-03-22: qty 30

## 2021-03-22 MED ORDER — BUPIVACAINE HCL (PF) 0.5 % IJ SOLN
INTRAMUSCULAR | Status: DC | PRN
  Administered 2021-03-22: 5 mL

## 2021-03-22 MED ORDER — MEMANTINE HCL 10 MG PO TABS
10.0000 mg | ORAL_TABLET | Freq: Two times a day (BID) | ORAL | Status: DC
Start: 2021-03-22 — End: 2021-03-23
  Administered 2021-03-23: 10 mg via ORAL
  Filled 2021-03-22 (×3): qty 1

## 2021-03-22 MED ORDER — DEXAMETHASONE SODIUM PHOSPHATE 10 MG/ML IJ SOLN
INTRAMUSCULAR | Status: DC | PRN
Start: 2021-03-22 — End: 2021-03-22
  Administered 2021-03-22: 10 mg via INTRAVENOUS

## 2021-03-22 MED ORDER — THROMBIN 5000 UNITS EX SOLR
CUTANEOUS | Status: DC | PRN
  Administered 2021-03-22 (×2): 5000 [IU] via TOPICAL

## 2021-03-22 MED ORDER — PROPOFOL 10 MG/ML IV BOLUS
INTRAVENOUS | Status: AC
  Filled 2021-03-22: qty 20

## 2021-03-22 MED ORDER — FENTANYL CITRATE (PF) 100 MCG/2ML IJ SOLN: 25.0000 ug | INTRAMUSCULAR | Status: DC | PRN

## 2021-03-22 MED ORDER — KETOROLAC TROMETHAMINE 15 MG/ML IJ SOLN
7.5000 mg | Freq: Four times a day (QID) | INTRAMUSCULAR | Status: AC
  Administered 2021-03-22 – 2021-03-23 (×4): 7.5 mg via INTRAVENOUS
  Filled 2021-03-22 (×4): qty 1

## 2021-03-22 MED ORDER — OXYCODONE-ACETAMINOPHEN 5-325 MG PO TABS: 1.0000 | ORAL_TABLET | ORAL | Status: DC | PRN

## 2021-03-22 MED ORDER — BISACODYL 10 MG RE SUPP: 10.0000 mg | Freq: Every day | RECTAL | Status: DC | PRN

## 2021-03-22 MED ORDER — CEFAZOLIN SODIUM-DEXTROSE 2-4 GM/100ML-% IV SOLN
2.0000 g | Freq: Three times a day (TID) | INTRAVENOUS | Status: AC
  Administered 2021-03-22 – 2021-03-23 (×2): 2 g via INTRAVENOUS
  Filled 2021-03-22 (×2): qty 100

## 2021-03-22 MED ORDER — ACETAMINOPHEN 325 MG PO TABS: 650.0000 mg | ORAL_TABLET | Freq: Four times a day (QID) | ORAL | Status: DC | PRN

## 2021-03-22 MED ORDER — HEMOSTATIC AGENTS (NO CHARGE) OPTIME
TOPICAL | Status: DC | PRN
  Administered 2021-03-22: 1 via TOPICAL

## 2021-03-22 MED ORDER — FENTANYL CITRATE (PF) 250 MCG/5ML IJ SOLN
INTRAMUSCULAR | Status: AC
  Filled 2021-03-22: qty 5

## 2021-03-22 MED ORDER — MENTHOL 3 MG MT LOZG: 1.0000 | LOZENGE | OROMUCOSAL | Status: DC | PRN

## 2021-03-22 MED ORDER — ONDANSETRON HCL 4 MG/2ML IJ SOLN: 4.0000 mg | Freq: Four times a day (QID) | INTRAMUSCULAR | Status: DC | PRN

## 2021-03-22 MED ORDER — FINASTERIDE 5 MG PO TABS
5.0000 mg | ORAL_TABLET | Freq: Every day | ORAL | Status: DC
  Administered 2021-03-23: 5 mg via ORAL

## 2021-03-22 MED ORDER — PROPOFOL 10 MG/ML IV BOLUS
INTRAVENOUS | Status: DC | PRN
  Administered 2021-03-22: 150 mg via INTRAVENOUS

## 2021-03-22 MED ORDER — SODIUM CHLORIDE 0.9 % IV SOLN
250.0000 mL | INTRAVENOUS | Status: DC
  Administered 2021-03-22: 250 mL via INTRAVENOUS

## 2021-03-22 MED ORDER — LACTATED RINGERS IV SOLN: INTRAVENOUS | Status: DC

## 2021-03-22 MED ORDER — FLEET ENEMA 7-19 GM/118ML RE ENEM: 1.0000 | ENEMA | Freq: Once | RECTAL | Status: DC | PRN

## 2021-03-22 MED ORDER — MORPHINE SULFATE (PF) 2 MG/ML IV SOLN: 2.0000 mg | INTRAVENOUS | Status: DC | PRN

## 2021-03-22 MED ORDER — LIDOCAINE-EPINEPHRINE 1 %-1:100000 IJ SOLN
INTRAMUSCULAR | Status: AC
  Filled 2021-03-22: qty 1

## 2021-03-22 MED ORDER — SODIUM CHLORIDE 0.9% FLUSH: 3.0000 mL | Freq: Two times a day (BID) | INTRAVENOUS | Status: DC

## 2021-03-22 MED ORDER — DOCUSATE SODIUM 100 MG PO CAPS
100.0000 mg | ORAL_CAPSULE | Freq: Two times a day (BID) | ORAL | Status: DC
  Administered 2021-03-22 – 2021-03-23 (×2): 100 mg via ORAL
  Filled 2021-03-22: qty 1

## 2021-03-22 MED ORDER — CEFAZOLIN SODIUM-DEXTROSE 2-4 GM/100ML-% IV SOLN
2.0000 g | INTRAVENOUS | Status: AC
  Administered 2021-03-22: 2 g via INTRAVENOUS
  Filled 2021-03-22: qty 100

## 2021-03-22 MED ORDER — THROMBIN 5000 UNITS EX SOLR: OROMUCOSAL | Status: DC | PRN

## 2021-03-22 MED ORDER — ACETAMINOPHEN 650 MG RE SUPP: 650.0000 mg | RECTAL | Status: DC | PRN

## 2021-03-22 MED ORDER — CHLORHEXIDINE GLUCONATE 0.12 % MT SOLN
15.0000 mL | Freq: Once | OROMUCOSAL | Status: AC
  Administered 2021-03-22: 15 mL via OROMUCOSAL
  Filled 2021-03-22: qty 15

## 2021-03-22 MED ORDER — SENNA 8.6 MG PO TABS
1.0000 | ORAL_TABLET | Freq: Two times a day (BID) | ORAL | Status: DC
  Administered 2021-03-22: 8.6 mg via ORAL
  Filled 2021-03-22: qty 1

## 2021-03-22 MED ORDER — PHENYLEPHRINE 40 MCG/ML (10ML) SYRINGE FOR IV PUSH (FOR BLOOD PRESSURE SUPPORT)
PREFILLED_SYRINGE | INTRAVENOUS | Status: DC | PRN
  Administered 2021-03-22: 40 ug via INTRAVENOUS
  Administered 2021-03-22 (×2): 80 ug via INTRAVENOUS

## 2021-03-22 MED ORDER — QUETIAPINE FUMARATE 25 MG PO TABS
25.0000 mg | ORAL_TABLET | Freq: Every day | ORAL | Status: DC
Start: 2021-03-22 — End: 2021-03-23
  Filled 2021-03-22 (×2): qty 1

## 2021-03-22 MED ORDER — LIDOCAINE HCL (CARDIAC) PF 100 MG/5ML IV SOSY
PREFILLED_SYRINGE | INTRAVENOUS | Status: DC | PRN
  Administered 2021-03-22: 60 mg via INTRATRACHEAL

## 2021-03-22 MED ORDER — SODIUM CHLORIDE 0.9% FLUSH: 3.0000 mL | INTRAVENOUS | Status: DC | PRN

## 2021-03-22 MED ORDER — PHENOL 1.4 % MT LIQD: 1.0000 | OROMUCOSAL | Status: DC | PRN

## 2021-03-22 MED ORDER — METHOCARBAMOL 1000 MG/10ML IJ SOLN
500.0000 mg | Freq: Four times a day (QID) | INTRAVENOUS | Status: DC | PRN
  Filled 2021-03-22: qty 5

## 2021-03-22 MED ORDER — FENTANYL CITRATE (PF) 250 MCG/5ML IJ SOLN
INTRAMUSCULAR | Status: DC | PRN
  Administered 2021-03-22: 100 ug via INTRAVENOUS

## 2021-03-22 MED ORDER — ONDANSETRON HCL 4 MG PO TABS: 4.0000 mg | ORAL_TABLET | Freq: Four times a day (QID) | ORAL | Status: DC | PRN

## 2021-03-22 MED ORDER — ROCURONIUM BROMIDE 100 MG/10ML IV SOLN
INTRAVENOUS | Status: DC | PRN
  Administered 2021-03-22: 60 mg via INTRAVENOUS

## 2021-03-22 MED ORDER — ACETAMINOPHEN 325 MG PO TABS: 650.0000 mg | ORAL_TABLET | ORAL | Status: DC | PRN

## 2021-03-22 SURGICAL SUPPLY — 49 items
BAG COUNTER SPONGE SURGICOUNT (BAG) ×2 IMPLANT
BAG SPNG CNTER NS LX DISP (BAG) ×1
BAND RUBBER #18 3X1/16 STRL (MISCELLANEOUS) IMPLANT
BLADE CLIPPER SURG (BLADE) IMPLANT
BUR ACORN 6.0 (BURR) IMPLANT
BUR MATCHSTICK NEURO 3.0 LAGG (BURR) ×2 IMPLANT
CANISTER SUCT 3000ML PPV (MISCELLANEOUS) ×2 IMPLANT
DECANTER SPIKE VIAL GLASS SM (MISCELLANEOUS) ×1 IMPLANT
DERMABOND ADVANCED (GAUZE/BANDAGES/DRESSINGS) ×1
DERMABOND ADVANCED .7 DNX12 (GAUZE/BANDAGES/DRESSINGS) ×1 IMPLANT
DEVICE DISSECT PLASMABLAD 3.0S (MISCELLANEOUS) ×1 IMPLANT
DRAPE HALF SHEET 40X57 (DRAPES) IMPLANT
DRAPE LAPAROTOMY T 102X78X121 (DRAPES) ×2 IMPLANT
DRAPE MICROSCOPE LEICA (MISCELLANEOUS) IMPLANT
DURAPREP 26ML APPLICATOR (WOUND CARE) ×2 IMPLANT
ELECT REM PT RETURN 9FT ADLT (ELECTROSURGICAL) ×2
ELECTRODE REM PT RTRN 9FT ADLT (ELECTROSURGICAL) ×1 IMPLANT
GAUZE 4X4 16PLY ~~LOC~~+RFID DBL (SPONGE) IMPLANT
GAUZE SPONGE 4X4 12PLY STRL (GAUZE/BANDAGES/DRESSINGS) ×2 IMPLANT
GLOVE SURG LTX SZ8.5 (GLOVE) ×4 IMPLANT
GLOVE SURG UNDER POLY LF SZ8.5 (GLOVE) ×4 IMPLANT
GOWN STRL REUS W/ TWL LRG LVL3 (GOWN DISPOSABLE) IMPLANT
GOWN STRL REUS W/ TWL XL LVL3 (GOWN DISPOSABLE) IMPLANT
GOWN STRL REUS W/TWL 2XL LVL3 (GOWN DISPOSABLE) ×2 IMPLANT
GOWN STRL REUS W/TWL LRG LVL3 (GOWN DISPOSABLE) ×2
GOWN STRL REUS W/TWL XL LVL3 (GOWN DISPOSABLE) ×2
HEMOSTAT POWDER KIT SURGIFOAM (HEMOSTASIS) ×1 IMPLANT
KIT BASIN OR (CUSTOM PROCEDURE TRAY) ×2 IMPLANT
KIT TURNOVER KIT B (KITS) ×2 IMPLANT
NDL SPNL 20GX3.5 QUINCKE YW (NEEDLE) IMPLANT
NDL SPNL 22GX3.5 QUINCKE BK (NEEDLE) IMPLANT
NEEDLE HYPO 22GX1.5 SAFETY (NEEDLE) ×2 IMPLANT
NEEDLE SPNL 20GX3.5 QUINCKE YW (NEEDLE) IMPLANT
NEEDLE SPNL 22GX3.5 QUINCKE BK (NEEDLE) ×2 IMPLANT
NS IRRIG 1000ML POUR BTL (IV SOLUTION) ×2 IMPLANT
PACK LAMINECTOMY NEURO (CUSTOM PROCEDURE TRAY) ×2 IMPLANT
PAD ARMBOARD 7.5X6 YLW CONV (MISCELLANEOUS) ×6 IMPLANT
PATTIES SURGICAL .5 X1 (DISPOSABLE) ×2 IMPLANT
PLASMABLADE 3.0S (MISCELLANEOUS) ×2
SEALANT ADHERUS EXTEND TIP (MISCELLANEOUS) ×1 IMPLANT
SPONGE SURGIFOAM ABS GEL SZ50 (HEMOSTASIS) ×2 IMPLANT
SUT VIC AB 1 CT1 18XBRD ANBCTR (SUTURE) ×1 IMPLANT
SUT VIC AB 1 CT1 8-18 (SUTURE) ×2
SUT VIC AB 2-0 CP2 18 (SUTURE) ×2 IMPLANT
SUT VIC AB 3-0 SH 8-18 (SUTURE) ×2 IMPLANT
SUT VIC AB 4-0 RB1 18 (SUTURE) ×2 IMPLANT
TOWEL GREEN STERILE (TOWEL DISPOSABLE) ×2 IMPLANT
TOWEL GREEN STERILE FF (TOWEL DISPOSABLE) ×2 IMPLANT
WATER STERILE IRR 1000ML POUR (IV SOLUTION) ×2 IMPLANT

## 2021-03-22 NOTE — Anesthesia Procedure Notes (Signed)
Procedure Name: Intubation Date/Time: 03/22/2021 3:11 PM Performed by: Josephine Igo, CRNA Pre-anesthesia Checklist: Patient identified, Emergency Drugs available, Suction available and Patient being monitored Patient Re-evaluated:Patient Re-evaluated prior to induction Oxygen Delivery Method: Circle system utilized Preoxygenation: Pre-oxygenation with 100% oxygen Induction Type: IV induction Ventilation: Oral airway inserted - appropriate to patient size and Mask ventilation without difficulty Laryngoscope Size: Miller and 2 Grade View: Grade I Tube type: Oral Tube size: 7.0 mm Number of attempts: 1 Airway Equipment and Method: Stylet Placement Confirmation: ETT inserted through vocal cords under direct vision, positive ETCO2 and breath sounds checked- equal and bilateral Secured at: 22 cm Tube secured with: Tape Dental Injury: Teeth and Oropharynx as per pre-operative assessment

## 2021-03-22 NOTE — Transfer of Care (Signed)
Immediate Anesthesia Transfer of Care Note  Patient: Henry [name]  Procedure(s) Performed: Left Lumbar three-four Laminectomy and foraminotomy (Left: Spine Lumbar)  Patient Location: PACU  Anesthesia Type:General  Level of Consciousness: drowsy and patient cooperative  Airway & Oxygen Therapy: Patient Spontanous Breathing and Patient connected to nasal cannula oxygen  Post-op Assessment: Report given to RN and Post -op Vital signs reviewed and stable  Post vital signs: Reviewed and stable  Last Vitals:  Vitals Value Taken Time  BP 130/78 03/22/21 1637  Temp    Pulse 38 03/22/21 1637  Resp 19 03/22/21 1638  SpO2 88 % 03/22/21 1637  Vitals shown include unvalidated device data.  Last Pain:  Vitals:   03/22/21 1241  TempSrc: Oral      Patients Stated Pain Goal: 3 (16/10/96 0454)  Complications: No notable events documented.

## 2021-03-22 NOTE — H&P (Signed)
Henry [name] is an 81 y.o. male.   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  Several years ago he underwent a decompression via laminectomy.  He tolerated this well but then a year ago he sustained a right MCA area stroke.  This left him with significant left upper and lower extremity paralysis.  He is gradually overcome this but has developed significant pain in the back and left lower extremity.  Particularly has significant weakness in the quadriceps muscle in addition to spasticity there.  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  I evaluated him in the office a few weeks ago and advised that surgical decompression of the synovial cyst may help relieve a considerable amount of his left lower extremity pain and perhaps even help with the weakness.  He is now admitted for that surgical intervention.  Past Medical History:  Diagnosis Date   Colon polyps    COVID-19    Elevated MCV 2011   GERD (gastroesophageal reflux disease) 2012   LBP (low back pain)    MRI L spine 06/2007: L L4 stenosis, 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   Osteoarthritis    Pneumonia    Polycythemia 2011   Stroke [facility])     Past Surgical History:  Procedure Laterality Date   APPENDECTOMY     COLONOSCOPY     HERNIA REPAIR     march 2019   INGUINAL HERNIA REPAIR Left 2015   IR CT HEAD LTD  04/17/2019   IR PERCUTANEOUS ART THROMBECTOMY/INFUSION INTRACRANIAL INC DIAG ANGIO  04/17/2019   LUMBAR LAMINECTOMY/DECOMPRESSION MICRODISCECTOMY N/A 05/20/2016   Procedure: Lumbar Three-Four,Lunmbar Four-Five,Lumbar Five-Sacral One Laminectomy/Foraminotomy;  Surgeon: Kristeen Miss, MD;  Location: [place];  Service: Neurosurgery;  Laterality: N/A;   POLYPECTOMY     RADIOLOGY WITH ANESTHESIA N/A 04/17/2019    Procedure: IR WITH ANESTHESIA;  Surgeon: Luanne Bras, MD;  Location: [place];  Service: Radiology;  Laterality: N/A;    Family History  Problem Relation Age of Onset   Colon cancer Sister 64   Breast cancer Other        1st degree relative   Stroke Neg Hx    Esophageal cancer Neg Hx    Pancreatic cancer Neg Hx    Prostate cancer Neg Hx    Rectal cancer Neg Hx    Stomach cancer Neg Hx    Social History:  reports that he quit smoking about 44 years ago. His smoking use included cigarettes. He smoked an average of 1 pack per day. He has never used smokeless tobacco. He reports that he does not drink alcohol and does not use drugs.  Allergies: No Known Allergies  No medications prior to admission.    No results found for this or any previous visit (from the past 48 hour(s)). No results found.  Review of Systems  Constitutional:  Positive for activity change.  Musculoskeletal:  Positive for back pain, gait problem and myalgias.  Neurological:  Positive for weakness and numbness.  Hematological: Negative.   Psychiatric/Behavioral: Negative.    All other systems reviewed and are negative.  There were no vitals taken for this visit. Physical Exam Constitutional:      Appearance: Normal appearance. He is normal weight.  HENT:     Head: Normocephalic  and atraumatic.     Nose: Nose normal.     Mouth/Throat:     Mouth: Mucous membranes are moist.  Eyes:     Extraocular Movements: Extraocular movements intact.     Pupils: Pupils are equal, round, and reactive to light.  Cardiovascular:     Rate and Rhythm: Normal rate and regular rhythm.     Pulses: Normal pulses.  Pulmonary:     Effort: Pulmonary effort is normal.     Breath sounds: Normal breath sounds.  Abdominal:     General: Abdomen is flat.     Palpations: Abdomen is soft.  Musculoskeletal:     Cervical back: Normal range of motion.     Comments: Marked rigidity in the left upper and left lower extremities with  moderate weakness in both the upper and lower extremities.  Particular weakness is noted in the quadriceps and iliopsoas on the left side being graded 3 out of 5.  Deep tendon reflexes absent in the patellae and Achilles upgoing Babinski on the left.  Upper extremity reflexes 3+ in the bicep.  2+ in the tricep.  Cranial nerve examination is intact.  Skin:    General: Skin is warm and dry.     Capillary Refill: Capillary refill takes less than 2 seconds.  Neurological:     Mental Status: He is alert.     Comments: See musculoskeletal exam  Psychiatric:        Mood and Affect: Mood normal.        Behavior: Behavior normal.        Thought Content: Thought content normal.        Judgment: Judgment normal.     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  Plan: Decompression of L3-4 left via laminotomy and foraminotomy  Earleen Newport, MD 03/22/2021, 8:02 AM

## 2021-03-22 NOTE — Op Note (Signed)
Date of surgery 03/22/2021 Preoperative diagnosis: Left lumbar radiculopathy, L3 distribution Postoperative diagnosis: Same Procedure: Left laminotomy foraminotomies L3-4 resection of synovial cyst decompression L3 and L4 nerve roots Surgeon: Kristeen Miss First Assistant: Newman Pies, MD Anesthesia: General endotracheal Indications: Henry [name] is a 81 year old individual whose had a previous lumbar decompression for stenosis.  A year ago he suffered a stroke which left him with left hemiparesis.  Patient started to develop increasing a left lumbar radicular pain he does look weak in his leg but he has pain in the L3 and L4 distributions.  An MRI demonstrates presence of a large synovial cyst on the left side at the level of L3-L4 compressing the exiting L3 nerve root superiorly and the L4 nerve root inferiorly.  Procedure the patient was brought to the operating room supine on the stretcher.  After the smooth induction of general endotracheal anesthesia, he was carefully turned prone.  The back was prepped with alcohol DuraPrep and draped in a sterile fashion.  Midline incision was created and carried down to the lumbodorsal fascia overlying L3-L4.  After taking a couple of localizing x-rays to identify the L3-L4 space positively a laminotomy was created removing the inferior margin lamina of L3 out to including the mesial facet of L3-4.  This was carefully taken down and significant grumous thickened material was encountered in this region there was some previous epidural fibrosis from the surgery at the laminectomy.  But medially and deep to this was noted a significant cystic mass that was adherent to the dura.  This was carefully dissected and removed from the dural wall itself.  The path of the L3 nerve root superiorly was then freed using combination of curettes and rongeurs.  A mesial facetectomy was then continued inferiorly to expose and decompress the path the L4 nerve root once this was  accomplished some epidural fibrosis in the area was taken down as best possible but a good decompression was obtained of the lateral dural tube.  Small amount of CSF was noted to be coming medially from the superior margin of the dissection which included considerable epidural fibrotic tissue.  This could easily be controlled with some surgery cell and cottonoid tamponade but because no discretely could be identified biologic adhesive was placed over this area.  Once set no further leakage was noted.  The retractor was removed and the lumbodorsal fascia was then closed with #1 Vicryl interrupted fashion 2-0 Vicryl was used in subcutaneous tissues and 3-0 Vicryl subcuticularly.  Dermabond was placed on the skin.  Blood loss for the procedure was estimated less than 50 cc.

## 2021-03-22 NOTE — Interval H&P Note (Signed)
History and Physical Interval Note:  03/22/2021 2:42 PM  Henry [name]  has presented today for surgery, with the diagnosis of chronic lumbar radiculopathy.  The various methods of treatment have been discussed with the patient and family. After consideration of risks, benefits and other options for treatment, the patient has consented to  Procedure(s) with comments: Left L3-4 Laminectomy/foraminotomy (Left) - 3C/RM 20 as a surgical intervention.  The patient's history has been reviewed, patient examined, no change in status, stable for surgery.  I have reviewed the patient's chart and labs.  Questions were answered to the patient's satisfaction.     Earleen Newport

## 2021-03-22 NOTE — Anesthesia Preprocedure Evaluation (Signed)
Anesthesia Evaluation  Patient identified by MRN, date of birth, ID band Patient awake    Reviewed: Allergy & Precautions, NPO status , Patient's Chart, lab work & pertinent test results  Airway Mallampati: II  TM Distance: >3 FB Neck ROM: Full    Dental  (+) Poor Dentition   Pulmonary neg pulmonary ROS, former smoker,    Pulmonary exam normal        Cardiovascular hypertension,  Rhythm:Regular Rate:Normal     Neuro/Psych Dementia CVA    GI/Hepatic Neg liver ROS, GERD  ,  Endo/Other  negative endocrine ROS  Renal/GU negative Renal ROS  negative genitourinary   Musculoskeletal  (+) Arthritis , Osteoarthritis,    Abdominal Normal abdominal exam  (+)   Peds  Hematology negative hematology ROS (+)   Anesthesia Other Findings   Reproductive/Obstetrics                             Anesthesia Physical Anesthesia Plan  ASA: 3  Anesthesia Plan: General   Post-op Pain Management:    Induction: Intravenous  PONV Risk Score and Plan: 2 and Ondansetron, Dexamethasone and Treatment may vary due to age or medical condition  Airway Management Planned: Mask and Oral ETT  Additional Equipment: None  Intra-op Plan:   Post-operative Plan: Extubation in OR  Informed Consent: I have reviewed the patients History and Physical, chart, labs and discussed the procedure including the risks, benefits and alternatives for the proposed anesthesia with the patient or authorized representative who has indicated his/her understanding and acceptance.     Dental advisory given  Plan Discussed with: CRNA  Anesthesia Plan Comments:         Anesthesia Quick Evaluation

## 2021-03-23 ENCOUNTER — Encounter (HOSPITAL_COMMUNITY): Payer: Self-pay | Admitting: Neurological Surgery

## 2021-03-23 DIAGNOSIS — M4726 Other spondylosis with radiculopathy, lumbar region: Secondary | ICD-10-CM | POA: Diagnosis not present

## 2021-03-23 DIAGNOSIS — Z7982 Long term (current) use of aspirin: Secondary | ICD-10-CM | POA: Diagnosis not present

## 2021-03-23 DIAGNOSIS — Z79899 Other long term (current) drug therapy: Secondary | ICD-10-CM | POA: Diagnosis not present

## 2021-03-23 DIAGNOSIS — Z8673 Personal history of transient ischemic attack (TIA), and cerebral infarction without residual deficits: Secondary | ICD-10-CM | POA: Diagnosis not present

## 2021-03-23 DIAGNOSIS — Z8616 Personal history of COVID-19: Secondary | ICD-10-CM | POA: Diagnosis not present

## 2021-03-23 DIAGNOSIS — Z87891 Personal history of nicotine dependence: Secondary | ICD-10-CM | POA: Diagnosis not present

## 2021-03-23 DIAGNOSIS — Z800 Family history of malignant neoplasm of digestive organs: Secondary | ICD-10-CM | POA: Diagnosis not present

## 2021-03-23 DIAGNOSIS — G96191 Perineural cyst: Secondary | ICD-10-CM | POA: Diagnosis not present

## 2021-03-23 MED ORDER — METHOCARBAMOL 500 MG PO TABS: 500.0000 mg | ORAL_TABLET | Freq: Four times a day (QID) | ORAL | 3 refills | Status: DC | PRN

## 2021-03-23 MED ORDER — OXYCODONE-ACETAMINOPHEN 5-325 MG PO TABS: 1.0000 | ORAL_TABLET | ORAL | 0 refills | Status: DC | PRN

## 2021-03-23 NOTE — Evaluation (Signed)
Physical Therapy Evaluation Patient Details Name: Henry [name] MRN: [id number] DOB: Feb 01, 1941 Today's Date: 03/23/2021  History of Present Illness  Henry [name] is a 81 year old male who is s/p  Left laminotomy foraminotomies L3-4 resection of synovial cyst decompression L3 and L4 nerve roots 1/16. PMHx: R MCA CVA 2021, GERD, OA, back sx 2018  Clinical Impression  Pt presented s/p procedures listed above. PTA, pt was ambulating household distances prior to a fall 3 months ago, but since has been mobilizing with a wheelchair modI. 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 Pt presents with fair pain control and seemingly improved left lower extremity strength. Pt ambulating initially 20 ft with a SPC and then an additional 50 ft with a quad cane at a min assist level. Pt with much improved balance with quad cane and recommended for ambulation in addition to close guarding from pt wife. Provided with gait belt. Pt would benefit from follow up HHPT to address deficits and maximize functional mobility.      Recommendations for follow up therapy are one component of a multi-disciplinary discharge planning process, led by the attending physician.  Recommendations may be updated based on patient status, additional functional criteria and insurance authorization.  Follow Up Recommendations Home health PT    Assistance Recommended at Discharge Frequent or constant Supervision/Assistance  Patient can return home with the following  A little help with walking and/or transfers;A little help with bathing/dressing/bathroom;Assistance with cooking/housework;Assist for transportation;Help with stairs or ramp for entrance    Equipment Recommendations Other (comment) (quad cane)  Recommendations for Other Services       Functional Status Assessment Patient has had a recent decline in their functional status and demonstrates the ability to make significant  improvements in function in a reasonable and predictable amount of time.     Precautions / Restrictions Precautions Precautions: Fall;Back Precaution Booklet Issued: Yes (comment) Precaution Comments: L hemiplegia Restrictions Weight Bearing Restrictions: No      Mobility  Bed Mobility Overal bed mobility: Needs Assistance Bed Mobility: Rolling, Sidelying to Sit, Sit to Sidelying Rolling: Supervision Sidelying to sit: Min assist     Sit to sidelying: Min guard General bed mobility comments: verbal cues and assist for trunk elevation. min guard for return to bed    Transfers Overall transfer level: Needs assistance Equipment used: Straight cane, Quad cane Transfers: Sit to/from Stand Sit to Stand: Min assist           General transfer comment: cues for scooting out to edge of bed, minA to rise and steady    Ambulation/Gait Ambulation/Gait assistance: Min assist, Mod assist Gait Distance (Feet): 70 Feet (20 ft, then 50 ft) Assistive device: Quad cane, Straight cane Gait Pattern/deviations: Step-to pattern, Decreased weight shift to left, Decreased dorsiflexion - left, Narrow base of support Gait velocity: decreased Gait velocity interpretation: <1.8 ft/sec, indicate of risk for recurrent falls   General Gait Details: Pt ambulated first 20 ft with SPC, then an additional 50 ft with quad cane. Consistent cues for sequencing, technique, cane use, upright posture. Improved balance with quad cane vs SPC. Decreased pelvic rotation and weight shift towards left  Stairs            Wheelchair Mobility    Modified Rankin (Stroke Patients Only)       Balance Overall balance assessment: Needs assistance Sitting-balance support: Feet supported Sitting balance-Leahy Scale: Good     Standing balance support: Single extremity supported Standing  balance-Leahy Scale: Poor                               Pertinent Vitals/Pain Pain Assessment Pain  Assessment: 0-10 Pain Score: 6  Pain Location: back, LLE Pain Descriptors / Indicators: Discomfort Pain Intervention(s): Limited activity within patient's tolerance, Monitored during session    Home Living Family/patient expects to be discharged to:: Private residence Living Arrangements: Spouse/significant other Available Help at Discharge: Family;Available 24 hours/day Type of Home: House Home Access: Ramped entrance       Home Layout: One level Home Equipment: Cane - single point;Grab bars - tub/shower;Grab bars - toilet;Wheelchair - manual      Prior Function Prior Level of Function : Needs assist       Physical Assist : Mobility (physical);ADLs (physical) Mobility (physical): Transfers;Gait ADLs (physical): Bathing;Dressing;Toileting;IADLs Mobility Comments: prior to a fall 3 months ago pt was ambulating with SPC; post fall pt was wc level with indep SP transfers. ADLs Comments: Sponge bathes at baseline. SP transfers from wc to toilet indep. Assist for LB dressing. Wife completes all IADLs     Hand Dominance   Dominant Hand: Right    Extremity/Trunk Assessment   Upper Extremity Assessment Upper Extremity Assessment: LUE deficits/detail LUE Deficits / Details: minimal ROM, unable to use a funcitonal assist. full PROM. LUE Sensation: WNL;decreased light touch LUE Coordination: decreased fine motor;decreased gross motor    Lower Extremity Assessment Lower Extremity Assessment: RLE deficits/detail;LLE deficits/detail RLE Deficits / Details: Strength 5/5 LLE Deficits / Details: Residual weakness from prior stroke. Hip flexion 3+/5, knee extension 4/5, ankle dorsiflexion WFL    Cervical / Trunk Assessment Cervical / Trunk Assessment: Kyphotic  Communication   Communication: No difficulties  Cognition Arousal/Alertness: Awake/alert Behavior During Therapy: WFL for tasks assessed/performed, Anxious Overall Cognitive Status: Impaired/Different from baseline Area  of Impairment: Attention, Memory, Following commands, Safety/judgement, Awareness, Problem solving                   Current Attention Level: Sustained Memory: Decreased recall of precautions, Decreased short-term memory Following Commands: Follows one step commands with increased time Safety/Judgement: Decreased awareness of deficits Awareness: Emergent Problem Solving: Slow processing, Difficulty sequencing, Requires verbal cues General Comments: pt is Ox4. he required incrased time for processing and multimodal cues for sequencing.  unable to give accurate detail of PLOF with correct timeline. Wife consistently correcting pt.        General Comments General comments (skin integrity, edema, etc.): VSS on RA, wife present and supportive    Exercises     Assessment/Plan    PT Assessment Patient needs continued PT services  PT Problem List Decreased strength;Decreased activity tolerance;Decreased balance;Decreased mobility;Pain;Decreased cognition;Decreased safety awareness       PT Treatment Interventions DME instruction;Gait training;Functional mobility training;Balance training;Therapeutic activities;Therapeutic exercise;Patient/family education    PT Goals (Current goals can be found in the Care Plan section)  Acute Rehab PT Goals Patient Stated Goal: to go home, walk PT Goal Formulation: With patient/family Time For Goal Achievement: 04/06/21 Potential to Achieve Goals: Good    Frequency Min 5X/week     Co-evaluation               AM-PAC PT "6 Clicks" Mobility  Outcome Measure Help needed turning from your back to your side while in a flat bed without using bedrails?: A Little Help needed moving from lying on your back to sitting on the side of  a flat bed without using bedrails?: A Little Help needed moving to and from a bed to a chair (including a wheelchair)?: A Little Help needed standing up from a chair using your arms (e.g., wheelchair or bedside  chair)?: A Little Help needed to walk in hospital room?: A Little Help needed climbing 3-5 steps with a railing? : A Lot 6 Click Score: 17    End of Session Equipment Utilized During Treatment: Gait belt Activity Tolerance: Patient tolerated treatment well Patient left: in bed;with call bell/phone within reach Nurse Communication: Mobility status PT Visit Diagnosis: Unsteadiness on feet (R26.81);Other abnormalities of gait and mobility (R26.89);Difficulty in walking, not elsewhere classified (R26.2);History of falling (Z91.81);Hemiplegia and hemiparesis Hemiplegia - Right/Left: Left    Time: 3825-0539 PT Time Calculation (min) (ACUTE ONLY): 26 min   Charges:   PT Evaluation $PT Eval Low Complexity: 1 Low PT Treatments $Gait Training: 8-22 mins        Wyona Almas, PT, DPT Acute Rehabilitation Services Pager [phone] Office [phone]   Deno Etienne 03/23/2021, 11:50 AM

## 2021-03-23 NOTE — Discharge Summary (Signed)
Physician Discharge Summary  Patient ID: Henry [name] MRN: [id number] DOB/AGE: [birth year] 81 y.o.  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 rate 16, height 5\' 8"  (1.727 m), weight 70.3 kg, SpO2 97 %. Incision is clean dry motorists strength is unchanged with mild weak left lower extremity and left hemiparesis and upper extremity  Disposition: Discharge disposition: 01-Home or Self Care       Discharge Instructions     Call MD for:  redness, tenderness, or signs of infection (pain, swelling, redness, odor or green/yellow discharge around incision site)   Complete by: As directed    Call MD for:  severe uncontrolled pain   Complete by: As directed    Call MD for:  temperature >100.4   Complete by: As directed    Diet - low sodium heart healthy   Complete by: As directed    Discharge wound care:   Complete by: As directed    Okay to shower. Do not apply salves or appointments to incision. No heavy lifting with the upper extremities greater than 10 pounds. May resume driving when not requiring pain medication and patient feels comfortable with doing so.   Incentive spirometry RT   Complete by: As directed    Increase activity slowly   Complete by: As directed       Allergies as of 03/23/2021   No Known Allergies      Medication List     TAKE these  medications    acetaminophen 325 MG tablet Commonly known as: TYLENOL Take 650 mg by mouth every 6 (six) hours as needed for moderate pain.   aspirin EC 81 MG tablet Take 1 tablet (81 mg total) by mouth daily. Swallow whole.   atorvastatin 40 MG tablet Commonly known as: LIPITOR TAKE ONE (1) TABLET BY MOUTH EVERY DAY AT 6PM. What changed: See the new instructions.   B Complex Plus Tabs Take 1 tablet by mouth daily.   celecoxib 200 MG capsule Commonly known as: CeleBREX One po qd or BID prn pain   donepezil 10 MG tablet Commonly known as: ARICEPT Take 1 tablet (10 mg total) by mouth at bedtime.   finasteride 5 MG tablet Commonly known as: Proscar Take 1 tablet (5 mg total) by mouth daily.   memantine 10 MG tablet Commonly known as: NAMENDA TAKE 1 TABLET BY MOUTH TWICE DAILY.   methocarbamol 500 MG tablet Commonly known as: ROBAXIN Take 1 tablet (500 mg total) by mouth every 6 (six) hours as needed for muscle spasms.   oxyCODONE-acetaminophen 5-325 MG tablet Commonly known as: PERCOCET/ROXICET Take 1 tablet by mouth every 4 (four) hours as needed for moderate pain or severe pain.   Prostate Health Caps Take 1 capsule by mouth in the morning and at bedtime.   NEURIVA PO Take 1 capsule by mouth daily.   QUEtiapine 25 MG tablet Commonly known as: SEROQUEL TAKE 1/2 TABLET BY MOUTH IN THE MORNING AND TAKE 2 TABLETS AT NIGHT What changed: See the  new instructions.   traMADol 50 MG tablet Commonly known as: ULTRAM Take 50 mg by mouth every 6 (six) hours as needed for moderate pain.   Vitamin D 125 MCG (5000 UT) Caps Take 5,000 Units by mouth daily.   Vitamin D3 50 MCG (2000 UT) capsule Take 1 capsule (2,000 Units total) by mouth daily.               Discharge Care Instructions  (From admission, onward)           Start     Ordered   03/23/21 0000  Discharge wound care:       Comments: Okay to shower. Do not apply salves or appointments to incision.  No heavy lifting with the upper extremities greater than 10 pounds. May resume driving when not requiring pain medication and patient feels comfortable with doing so.   03/23/21 1421            Follow-up Information     [facility] Follow up.   Specialty: Home Health Services Contact information: [address] [place] [address] [phone]                 Signed: Earleen Newport 03/23/2021, 2:24 PM

## 2021-03-23 NOTE — Evaluation (Signed)
Occupational Therapy Evaluation Patient Details Name: Henry [name] MRN: [id number] DOB: 11-15-1940 Today's Date: 03/23/2021   History of Present Illness Henry [name] is a 81 year old male who is s/p  Left laminotomy foraminotomies L3-4 resection of synovial cyst decompression L3 and L4 nerve roots 1/16. PMHx: R MCA CVA 2021, GERD, OA, back sx 2018   Clinical Impression   Derreck reports that he was ambulating household distanced prior to a fall about 3 months ago, but since has been wc level with mod I SP transfers. His wife assists with ADLs and he has been sponge bathing since his fall. He lives in a 1 level home, ramped entrance with his wife who assists pt 24/7. Upon evaluation pt required max verbal cues fro sequencing, back precautions and safety. After review, he verbalized understanding of his back precautions. Overall pt required mod A for bed mobility, max A for sit<>stand and side stepping at the bed side. Pt requires up to max A for ADLs, and he and his wife demonstrated great ability to complete. Pt would benefit from continue OT acutely. Recommend d/c home with [place].      Recommendations for follow up therapy are one component of a multi-disciplinary discharge planning process, led by the attending physician.  Recommendations may be updated based on patient status, additional functional criteria and insurance authorization.      Assistance Recommended at Discharge Frequent or constant Supervision/Assistance  Patient can return home with the following A lot of help with walking and/or transfers;A lot of help with bathing/dressing/bathroom;Direct supervision/assist for medications management;Assist for transportation;Help with stairs or ramp for entrance    Functional Status Assessment  Patient has had a recent decline in their functional status and demonstrates the ability to make significant improvements in function in a reasonable and predictable amount of time.  Equipment  Recommendations  None recommended by OT (Pt well equipped denies need for additional DME)    Recommendations for Other Services       Precautions / Restrictions Precautions Precautions: Fall;Back Precaution Booklet Issued: Yes (comment) Precaution Comments: L hemiparesis Restrictions Weight Bearing Restrictions: No      Mobility Bed Mobility Overal bed mobility: Needs Assistance Bed Mobility: Rolling, Sidelying to Sit Rolling: Supervision Sidelying to sit: Min assist       General bed mobility comments: verbal cues and asssit for trunk elevation    Transfers Overall transfer level: Needs assistance Equipment used: 1 person hand held assist Transfers: Sit to/from Stand Sit to Stand: Max assist           General transfer comment: max A to power into standing, once pt standing we progressed to min A for static stand. mod-max for weight shifting and side stepping at the bed side. Verbal cues for all sequencing.      Balance Overall balance assessment: Needs assistance Sitting-balance support: Feet supported Sitting balance-Leahy Scale: Good     Standing balance support: Single extremity supported Standing balance-Leahy Scale: Poor                             ADL either performed or assessed with clinical judgement   ADL Overall ADL's : Needs assistance/impaired Eating/Feeding: Set up;Sitting   Grooming: Set up;Sitting   Upper Body Bathing: Minimal assistance;Sitting   Lower Body Bathing: Maximal assistance;Sit to/from stand   Upper Body Dressing : Minimal assistance;Sitting   Lower Body Dressing: Maximal assistance;Sit to/from stand   Toilet Transfer: Maximal assistance;Stand-pivot  Toileting- Clothing Manipulation and Hygiene: Min guard;Sitting/lateral lean       Functional mobility during ADLs: Maximal assistance (Limited to SP and side steppign at bed side due to only +1 assist this session) General ADL Comments: Pt limited by  baseline L hemiparesis     Vision Baseline Vision/History: 0 No visual deficits Vision Assessment?: No apparent visual deficits     Perception     Praxis      Pertinent Vitals/Pain Pain Assessment Pain Assessment: 0-10 Pain Score: 6  Pain Location: back, LLE Pain Descriptors / Indicators: Discomfort Pain Intervention(s): Limited activity within patient's tolerance, Monitored during session     Hand Dominance Right   Extremity/Trunk Assessment Upper Extremity Assessment Upper Extremity Assessment: LUE deficits/detail LUE Deficits / Details: minimal ROM, unable to use a funcitonal assist. full PROM. LUE Sensation: WNL;decreased light touch LUE Coordination: decreased fine motor;decreased gross motor   Lower Extremity Assessment Lower Extremity Assessment: Defer to PT evaluation   Cervical / Trunk Assessment Cervical / Trunk Assessment: Kyphotic   Communication Communication Communication: No difficulties   Cognition Arousal/Alertness: Awake/alert Behavior During Therapy: WFL for tasks assessed/performed, Anxious Overall Cognitive Status: Impaired/Different from baseline Area of Impairment: Attention, Memory, Following commands, Safety/judgement, Awareness, Problem solving                   Current Attention Level: Sustained Memory: Decreased recall of precautions, Decreased short-term memory Following Commands: Follows one step commands with increased time Safety/Judgement: Decreased awareness of deficits Awareness: Emergent Problem Solving: Slow processing, Difficulty sequencing, Requires verbal cues General Comments: pt is Ox4. he required incrased time for processing and multimodial cues for sequencing. Unable to recall back precautions, unable to give accurate detail of PLOF with correct timeline. Wife consistently correcting pt.     General Comments  VSS on RA, wife present and supportive    Exercises     Shoulder Instructions      Home Living  Family/patient expects to be discharged to:: Private residence Living Arrangements: Spouse/significant other Available Help at Discharge: Family;Available 24 hours/day Type of Home: House Home Access: Ramped entrance     Home Layout: One level     Bathroom Shower/Tub: Occupational psychologist: Standard Bathroom Accessibility: Yes How Accessible: Accessible via wheelchair;Accessible via walker Home Equipment: [place] - single point;Grab bars - tub/shower;Grab bars - toilet;Wheelchair - manual          Prior Functioning/Environment Prior Level of Function : Needs assist       Physical Assist : Mobility (physical);ADLs (physical) Mobility (physical): Transfers;Gait ADLs (physical): Bathing;Dressing;Toileting;IADLs Mobility Comments: prior to a fall 3 months ago pt was ambulating with SPC; post fall pt was wc level with indep SP transfers. ADLs Comments: Sponge bathes at baseline. SP transfers from wc to toilet indep. Assist for LB dressing. Wife completes all IADLs        OT Problem List: Decreased strength;Decreased range of motion;Impaired balance (sitting and/or standing);Decreased activity tolerance;Decreased cognition;Decreased knowledge of use of DME or AE;Decreased safety awareness;Decreased knowledge of precautions;Pain;Impaired UE functional use      OT Treatment/Interventions:      OT Goals(Current goals can be found in the care plan section) Acute Rehab OT Goals Patient Stated Goal: home OT Goal Formulation: With patient Time For Goal Achievement: 04/06/21 Potential to Achieve Goals: Good ADL Goals Pt Will Perform Grooming: with min guard assist;standing Pt Will Perform Lower Body Dressing: with min guard assist;with adaptive equipment;sit to/from stand Pt Will Transfer to Toilet:  with min guard assist;ambulating  OT Frequency: Min 2X/week    Co-evaluation              AM-PAC OT "6 Clicks" Daily Activity     Outcome Measure Help from another  person eating meals?: A Little Help from another person taking care of personal grooming?: A Little Help from another person toileting, which includes using toliet, bedpan, or urinal?: A Lot Help from another person bathing (including washing, rinsing, drying)?: A Lot Help from another person to put on and taking off regular upper body clothing?: A Little Help from another person to put on and taking off regular lower body clothing?: A Lot 6 Click Score: 15   End of Session Equipment Utilized During Treatment: Gait belt Nurse Communication: Mobility status  Activity Tolerance: Patient tolerated treatment well Patient left: in bed;with call bell/phone within reach;with family/visitor present  OT Visit Diagnosis: Unsteadiness on feet (R26.81);Other abnormalities of gait and mobility (R26.89);Pain;Hemiplegia and hemiparesis;History of falling (Z91.81);Muscle weakness (generalized) (M62.81) Hemiplegia - Right/Left: Left Hemiplegia - dominant/non-dominant: Non-Dominant Hemiplegia - caused by: Cerebral infarction                Time: 4650-3546 OT Time Calculation (min): 21 min Charges:  OT General Charges $OT Visit: 1 Visit OT Evaluation $OT Eval Moderate Complexity: 1 Mod  Lerone Onder A Kaeson Kleinert 03/23/2021, 10:19 AM

## 2021-03-23 NOTE — Anesthesia Postprocedure Evaluation (Signed)
Anesthesia Post Note  Patient: Henry [name]  Procedure(s) Performed: Left Lumbar three-four Laminectomy and foraminotomy (Left: Spine Lumbar)     Patient location during evaluation: PACU Anesthesia Type: General Level of consciousness: awake and alert Pain management: pain level controlled Vital Signs Assessment: post-procedure vital signs reviewed and stable Respiratory status: spontaneous breathing, nonlabored ventilation, respiratory function stable and patient connected to nasal cannula oxygen Cardiovascular status: blood pressure returned to baseline and stable Postop Assessment: no apparent nausea or vomiting Anesthetic complications: no   No notable events documented.  Last Vitals:  Vitals:   03/23/21 0405 03/23/21 0739  BP: 102/63 (!) 98/51  Pulse: 62 67  Resp: 18 16  Temp: 36.7 C 37 C  SpO2: 94% 96%    Last Pain:  Vitals:   03/23/21 0739  TempSrc: Oral  PainSc:                  March Rummage Colter Magowan

## 2021-03-23 NOTE — Progress Notes (Signed)
Patient alert and oriented, mae's as expected, voiding adequate amount of urine, swallowing without difficulty, no c/o pain at time of discharge. Patient discharged home with family. Script and discharged instructions given to patient. Patient and family stated understanding of instructions given. Patient has an appointment with Dr. Ellene Route in 3 weeks

## 2021-03-23 NOTE — Plan of Care (Signed)
Adequately Ready for Discharge home with spouse

## 2021-03-23 NOTE — TOC Progression Note (Signed)
Transition of Care [facility]) - Progression Note    Patient Details  Name: Henry [name] MRN: [id number] Date of Birth: 05/17/1940  Transition of Care [facility]) CM/SW Contact  Jacalyn Lefevre Edson Snowball, RN Phone Number: 03/23/2021, 10:34 AM  Clinical Narrative:     Discussed home health PT/OT with patient and wife at bedside.   Agreeable. Referral accepted by Tommi Rumps with [facility]   Expected Discharge Plan: [place] Barriers to Discharge: No Barriers Identified  Expected Discharge Plan and Services Expected Discharge Plan: [place]   Discharge Planning Services: CM Consult Post Acute Care Choice: [place] arrangements for the past 2 months: Single Family Home                 DME Arranged: N/A DME Agency: NA       HH Arranged: PT, OT HH Agency: [place] Date [facility] Agency Contacted: 03/23/21 Time [place]: 1033 Representative spoke with at [place]: [place] ([place]) Interventions    Readmission Risk Interventions No flowsheet data found.

## 2021-04-07 ENCOUNTER — Encounter: Payer: Self-pay | Admitting: Internal Medicine

## 2021-04-07 ENCOUNTER — Other Ambulatory Visit: Payer: Self-pay

## 2021-04-07 ENCOUNTER — Ambulatory Visit (INDEPENDENT_AMBULATORY_CARE_PROVIDER_SITE_OTHER): Payer: Medicare HMO | Admitting: Internal Medicine

## 2021-04-07 DIAGNOSIS — M25552 Pain in left hip: Secondary | ICD-10-CM | POA: Diagnosis not present

## 2021-04-07 DIAGNOSIS — N320 Bladder-neck obstruction: Secondary | ICD-10-CM | POA: Diagnosis not present

## 2021-04-07 DIAGNOSIS — M5416 Radiculopathy, lumbar region: Secondary | ICD-10-CM

## 2021-04-07 DIAGNOSIS — R338 Other retention of urine: Secondary | ICD-10-CM

## 2021-04-07 DIAGNOSIS — R630 Anorexia: Secondary | ICD-10-CM | POA: Diagnosis not present

## 2021-04-07 DIAGNOSIS — N401 Enlarged prostate with lower urinary tract symptoms: Secondary | ICD-10-CM | POA: Diagnosis not present

## 2021-04-07 DIAGNOSIS — W1800XA Striking against unspecified object with subsequent fall, initial encounter: Secondary | ICD-10-CM | POA: Diagnosis not present

## 2021-04-07 DIAGNOSIS — Z8673 Personal history of transient ischemic attack (TIA), and cerebral infarction without residual deficits: Secondary | ICD-10-CM

## 2021-04-07 MED ORDER — MEGESTROL ACETATE 20 MG PO TABS: 20.0000 mg | ORAL_TABLET | Freq: Two times a day (BID) | ORAL | 5 refills | Status: DC

## 2021-04-07 MED ORDER — METHYLPREDNISOLONE ACETATE 80 MG/ML IJ SUSP
80.0000 mg | Freq: Once | INTRAMUSCULAR | Status: AC
  Administered 2021-04-07: 80 mg via INTRAMUSCULAR

## 2021-04-07 MED ORDER — TAMSULOSIN HCL 0.4 MG PO CAPS: 0.4000 mg | ORAL_CAPSULE | Freq: Every day | ORAL | 3 refills | Status: DC

## 2021-04-07 NOTE — Progress Notes (Signed)
Subjective:  Patient ID: Henry [name], male    DOB: January 21, 1941  Age: 81 y.o. 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 rate 16, height 5\' 8"  (1.727 m), weight 70.3 kg, SpO2 97 %. Incision is clean dry motorists strength is unchanged with mild weak left lower extremity and left hemiparesis and upper extremity   Disposition: Discharge disposition: 01-Home or Self Care"         Outpatient Medications Prior to Visit  Medication Sig Dispense Refill   acetaminophen (TYLENOL) 325 MG tablet Take 650 mg by mouth every 6 (six) hours as needed for moderate pain.     aspirin EC 81 MG tablet Take 1 tablet (81 mg total) by mouth daily. Swallow whole. 30 tablet 11   atorvastatin (LIPITOR) 40 MG tablet TAKE ONE (1) TABLET BY MOUTH EVERY DAY AT 6PM. (Patient taking differently: Take 20 mg by mouth every other day.) 30 tablet 5   B Complex-Folic Acid (B COMPLEX PLUS) TABS Take 1 tablet by mouth daily. 100 tablet 3   celecoxib (CELEBREX) 200 MG capsule One po qd or BID prn pain 60 capsule 0   Cholecalciferol (VITAMIN D) 125 MCG (5000 UT) CAPS Take 5,000 Units by mouth daily.     Cholecalciferol (VITAMIN  D3) 50 MCG (2000 UT) capsule Take 1 capsule (2,000 Units total) by mouth daily. 100 capsule 3   donepezil (ARICEPT) 10 MG tablet Take 1 tablet (10 mg total) by mouth at bedtime. 90 tablet 3   finasteride (PROSCAR) 5 MG tablet Take 1 tablet (5 mg total) by mouth daily. 90 tablet 3   memantine (NAMENDA) 10 MG tablet TAKE 1 TABLET BY MOUTH TWICE DAILY. 60 tablet 5   methocarbamol (ROBAXIN) 500 MG tablet Take 1 tablet (500 mg total) by mouth every 6 (six) hours as needed for muscle spasms. 30 tablet 3   Misc Natural Products (NEURIVA PO) Take 1 capsule by mouth daily.     Misc Natural Products (PROSTATE HEALTH) CAPS Take 1 capsule by mouth in the morning and at bedtime.     oxyCODONE-acetaminophen (PERCOCET/ROXICET) 5-325 MG tablet Take 1 tablet by mouth every 4 (four) hours as needed for moderate pain or severe pain. 30 tablet 0   QUEtiapine (SEROQUEL) 25 MG tablet TAKE 1/2 TABLET BY MOUTH IN THE MORNING AND TAKE 2 TABLETS AT NIGHT (Patient taking differently: Take 25 mg by mouth at bedtime.) 90 tablet 3   traMADol (ULTRAM) 50 MG tablet Take 50 mg by mouth every 6 (six) hours as needed for moderate pain.     No facility-administered medications prior to visit.    ROS: Review of Systems  Constitutional:  Negative for appetite change, fatigue and unexpected weight change.  HENT:  Negative for congestion, nosebleeds, sneezing, sore throat and trouble swallowing.   Eyes:  Negative for itching and visual disturbance.  Respiratory:  Negative for cough.   Cardiovascular:  Negative for chest pain, palpitations and leg swelling.  Gastrointestinal:  Negative for abdominal distention, blood in stool, diarrhea and nausea.  Genitourinary:  Negative for frequency and hematuria.  Musculoskeletal:  Positive for back pain and gait problem. Negative for joint swelling and neck pain.  Skin:  Negative for rash.  Neurological:  Negative for dizziness, tremors, speech difficulty and weakness.   Psychiatric/Behavioral:  Negative for agitation, dysphoric mood and sleep disturbance. The patient is not nervous/anxious.    Objective:  BP 118/68 (BP Location: Right Arm)    Pulse (!) 114    Temp 98 F (36.7 C) (Oral)    Ht 5\' 9"  (1.753 m)    Wt 150 lb 12.8 oz (68.4 kg)    SpO2 95%    BMI 22.27 kg/m   BP Readings from Last 3 Encounters:  04/07/21 118/68  03/23/21 98/61  03/18/21 130/85    Wt Readings from Last 3 Encounters:  04/07/21 150 lb 12.8 oz (68.4 kg)  03/22/21 155 lb (70.3 kg)  03/18/21 155 lb (70.3 kg)    Physical Exam Constitutional:      General: He is not in acute distress.    Appearance: He is well-developed. He is obese.     Comments: NAD  Eyes:     Conjunctiva/sclera: Conjunctivae normal.     Pupils: Pupils are equal, round, and reactive to light.  Neck:     Thyroid: No thyromegaly.     Vascular: No JVD.  Cardiovascular:     Rate and Rhythm: Normal rate and regular rhythm.     Heart sounds: Normal heart sounds. No murmur heard.   No friction rub. No gallop.  Pulmonary:     Effort: Pulmonary effort is normal. No respiratory distress.     Breath sounds: Normal breath sounds. No wheezing or rales.  Chest:     Chest wall: No tenderness.  Abdominal:     General: Bowel sounds are normal. There is no distension.     Palpations: Abdomen is soft. There is no mass.     Tenderness: There is no abdominal tenderness. There is no guarding or rebound.  Musculoskeletal:        General: Tenderness present. Normal range of motion.     Cervical back: Normal range of motion.  Lymphadenopathy:     Cervical: No cervical adenopathy.  Skin:    General: Skin is warm and dry.     Findings: No rash.  Neurological:     Mental Status: He is alert and oriented to person, place, and time. Mental status is at baseline.     Cranial Nerves: No cranial nerve deficit.     Motor: Weakness present. No abnormal muscle tone.     Coordination: Coordination abnormal.     Gait: Gait  abnormal.     Deep Tendon Reflexes: Reflexes are normal and symmetric.  Psychiatric:        Behavior: Behavior normal.        Thought Content: Thought content normal.        Judgment: Judgment normal.  L hip w/pain L hemiparesis Using a cane    Procedure Note :     Procedure : Joint Injection,  L  hip   Indication:  Trochanteric bursitis with refractory  chronic pain.   Risks including unsuccessful  procedure , bleeding, infection, bruising, skin atrophy, "steroid flare-up" and others were explained to the patient in detail as well as the benefits. Informed consent was obtained verbally.  Tthe patient was placed in a comfortable lateral decubitus position. The point of maximal tenderness was identified. Skin was prepped with Betadine and alcohol. Then, a 5 cc syringe with a 2 inch long 24-gauge needle was used for a bursa injection.. The needle was advanced  Into the bursa. I injected the bursa with 4 mL of 2% lidocaine and 80 mg of Depo-Medrol .  Band-Aid was applied.   Tolerated well. Complications: None. Good pain relief following the procedure.   Postprocedure instructions :    A Band-Aid should be left on for 12 hours. Injection therapy is not a cure itself. It is used in conjunction with other modalities. You can use nonsteroidal anti-inflammatories like ibuprofen , hot and cold compresses. Rest is recommended in the next 24 hours. You need to report immediately  if fever, chills or any signs of infection develop.   Lab Results  Component Value Date   WBC 5.1 03/18/2021   HGB 11.0 (L) 03/18/2021   HCT 33.8 (L) 03/18/2021   PLT 110 (L) 03/18/2021   GLUCOSE 115 (H) 12/31/2020   CHOL 147 08/26/2019   TRIG 180.0 (H) 08/26/2019   HDL 41.80 08/26/2019   LDLDIRECT 135.0 02/24/2017   LDLCALC 70 08/26/2019   ALT 21 12/31/2020   AST 55 (H) 12/31/2020   NA 140 12/31/2020   K 3.9 12/31/2020   CL 103 12/31/2020   CREATININE 1.16 12/31/2020   BUN 21 12/31/2020   CO2 31  12/31/2020   TSH 1.24 09/05/2019   PSA 1.75 02/24/2017   INR 1.2 04/17/2019   HGBA1C 6.6 (H) 05/29/2019    DG Lumbar Spine 2-3 Views  Result Date: 03/22/2021 CLINICAL DATA:  Localization for laminectomy EXAM: LUMBAR SPINE - 2-3 VIEW COMPARISON:  03/17/2021 FINDINGS: Limited lateral intraoperative views of the lumbar spine. Similar lumbar numbering as MRI from December. Image obtained at 15:31 demonstrates linear localizing instrument posterior to L5. Subsequent image obtained at 15:33 demonstrates linear localizing instrument at the level of L4 posteriorly. Image obtained at 15:45 demonstrates linear localizing instrument posterior to the L3-L4 disc space. IMPRESSION: Limited intraoperative views of the lumbar spine obtained for localization purposes. Electronically Signed   By: Donavan Foil M.D.   On: 03/22/2021 18:00    Assessment & Plan:   Problem List Items Addressed This Visit     Bladder neck obstruction    Worse Start Flomax qd      BPH (benign prostatic hyperplasia)    Worse.  OK to take low dose Norco 1/2 tab prn Start Flomax 0.4 mg daily       Relevant Medications   tamsulosin (FLOMAX) 0.4 MG CAPS capsule   Chronic left-sided lumbar radiculopathy    Better. 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  Options to treat discussed. See procedure      Loss of appetite    Worse Start Megace po         Meds ordered this  encounter  Medications   megestrol (MEGACE) 20 MG tablet    Sig: Take 1 tablet (20 mg total) by mouth 2 (two) times daily.    Dispense:  60 tablet    Refill:  5   tamsulosin (FLOMAX) 0.4 MG CAPS capsule    Sig: Take 1 capsule (0.4 mg total) by mouth daily.    Dispense:  30 capsule    Refill:   3   methylPREDNISolone acetate (DEPO-MEDROL) injection 80 mg      Follow-up: Return in about 6 weeks (around 05/19/2021) for a follow-up visit.  Walker Kehr, MD

## 2021-04-07 NOTE — Assessment & Plan Note (Signed)
Better. 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

## 2021-04-07 NOTE — Assessment & Plan Note (Signed)
C/o L hip pain - bursitis

## 2021-04-07 NOTE — Assessment & Plan Note (Addendum)
New  L hip pain - bursitis.  Options to treat discussed. See procedure

## 2021-04-07 NOTE — Assessment & Plan Note (Signed)
Continue his strengthening exercises

## 2021-04-07 NOTE — Assessment & Plan Note (Addendum)
Worse.  OK to take low dose Norco 1/2 tab prn Start Flomax 0.4 mg daily

## 2021-04-07 NOTE — Assessment & Plan Note (Signed)
Worse Start Megace po

## 2021-04-07 NOTE — Assessment & Plan Note (Signed)
Worse Start Flomax qd

## 2021-04-29 ENCOUNTER — Ambulatory Visit: Payer: Medicare HMO | Admitting: Internal Medicine

## 2021-05-18 ENCOUNTER — Other Ambulatory Visit: Payer: Self-pay

## 2021-05-18 ENCOUNTER — Encounter: Payer: Medicare HMO | Attending: Physical Medicine & Rehabilitation | Admitting: Physical Medicine & Rehabilitation

## 2021-05-18 ENCOUNTER — Encounter: Payer: Self-pay | Admitting: Physical Medicine & Rehabilitation

## 2021-05-18 VITALS — BP 120/81 | HR 104 | Ht 69.0 in | Wt 146.0 lb

## 2021-05-18 DIAGNOSIS — G8110 Spastic hemiplegia affecting unspecified side: Secondary | ICD-10-CM

## 2021-05-18 NOTE — Patient Instructions (Signed)
You received a Xeomin injection today. You may experience soreness at the needle injection sites. Please call us if any of the injection sites turns red after a couple days or if there is any drainage. You may experience muscle weakness as a result of Botox. This would improve with time but can take several weeks to improve. The Xeomin should start working in about one week. The Xeomin usually last 3 months. The injection can be repeated every 3 months as needed.  

## 2021-05-18 NOTE — Progress Notes (Signed)
Xeomin Injection for spasticity using needle EMG guidance ? ?Dilution: 50 Units/ml ?Indication: Severe spasticity which interferes with ADL,mobility and/or  hygiene and is unresponsive to medication management and other conservative care ?Informed consent was obtained after describing risks and benefits of the procedure with the patient. This includes bleeding, bruising, infection, excessive weakness, or medication side effects. A REMS form is on file and signed. ?Needle: 27g 1" needle electrode ?Number of units per muscle ?left bicep 100 units, medial ?Left pronator quad 50 units ?Left FDS 50 units ?Left FDP 50 units ?Left FPL 50 units ?Left FCR 50 units ?Left Pronator teres 50U  ?All injections were done after obtaining appropriate EMG activity and after negative drawback for blood. The patient tolerated the procedure well. Post procedure instructions were given. A followup appointment was made.  ? ?

## 2021-05-20 ENCOUNTER — Other Ambulatory Visit: Payer: Self-pay

## 2021-05-20 ENCOUNTER — Encounter: Payer: Self-pay | Admitting: Internal Medicine

## 2021-05-20 ENCOUNTER — Ambulatory Visit (INDEPENDENT_AMBULATORY_CARE_PROVIDER_SITE_OTHER): Payer: Medicare HMO | Admitting: Internal Medicine

## 2021-05-20 DIAGNOSIS — N320 Bladder-neck obstruction: Secondary | ICD-10-CM | POA: Diagnosis not present

## 2021-05-20 DIAGNOSIS — R630 Anorexia: Secondary | ICD-10-CM | POA: Diagnosis not present

## 2021-05-20 DIAGNOSIS — I10 Essential (primary) hypertension: Secondary | ICD-10-CM

## 2021-05-20 DIAGNOSIS — R338 Other retention of urine: Secondary | ICD-10-CM | POA: Diagnosis not present

## 2021-05-20 DIAGNOSIS — N401 Enlarged prostate with lower urinary tract symptoms: Secondary | ICD-10-CM | POA: Diagnosis not present

## 2021-05-20 MED ORDER — QUETIAPINE FUMARATE 25 MG PO TABS: 25.0000 mg | ORAL_TABLET | Freq: Every evening | ORAL | 1 refills | Status: DC

## 2021-05-20 MED ORDER — MEGESTROL ACETATE 40 MG PO TABS: 40.0000 mg | ORAL_TABLET | Freq: Two times a day (BID) | ORAL | 5 refills | Status: AC

## 2021-05-20 MED ORDER — QUETIAPINE FUMARATE 25 MG PO TABS: 12.5000 mg | ORAL_TABLET | Freq: Every evening | ORAL | 1 refills | Status: DC

## 2021-05-20 NOTE — Progress Notes (Signed)
? ?Subjective:  ?Patient ID: Henry [name], male    DOB: 10-09-1940  Age: 81 y.o. MRN: [id number] ? ?CC: No chief complaint on file. ? ? ?HPI ?Henry [name] presents for BPH - Flomax did not help ?F/u on CVA, hip pain  -- better. F/u on wt loss - on Megace 20 mg bid ? ?Outpatient Medications Prior to Visit  ?Medication Sig Dispense Refill  ? acetaminophen (TYLENOL) 325 MG tablet Take 650 mg by mouth every 6 (six) hours as needed for moderate pain.    ? aspirin EC 81 MG tablet Take 1 tablet (81 mg total) by mouth daily. Swallow whole. 30 tablet 11  ? atorvastatin (LIPITOR) 40 MG tablet TAKE ONE (1) TABLET BY MOUTH EVERY DAY AT 6PM. (Patient taking differently: Take 20 mg by mouth every other day.) 30 tablet 5  ? B Complex-Folic Acid (B COMPLEX PLUS) TABS Take 1 tablet by mouth daily. 100 tablet 3  ? celecoxib (CELEBREX) 200 MG capsule One po qd or BID prn pain 60 capsule 0  ? Cholecalciferol (VITAMIN D) 125 MCG (5000 UT) CAPS Take 5,000 Units by mouth daily.    ? Cholecalciferol (VITAMIN D3) 50 MCG (2000 UT) capsule Take 1 capsule (2,000 Units total) by mouth daily. 100 capsule 3  ? donepezil (ARICEPT) 10 MG tablet Take 1 tablet (10 mg total) by mouth at bedtime. 90 tablet 3  ? memantine (NAMENDA) 10 MG tablet TAKE 1 TABLET BY MOUTH TWICE DAILY. 60 tablet 5  ? methocarbamol (ROBAXIN) 500 MG tablet Take 1 tablet (500 mg total) by mouth every 6 (six) hours as needed for muscle spasms. 30 tablet 3  ? Misc Natural Products (NEURIVA PO) Take 1 capsule by mouth daily.    ? Misc Natural Products (PROSTATE HEALTH) CAPS Take 1 capsule by mouth in the morning and at bedtime.    ? finasteride (PROSCAR) 5 MG tablet Take 1 tablet (5 mg total) by mouth daily. 90 tablet 3  ? megestrol (MEGACE) 20 MG tablet Take 1 tablet (20 mg total) by mouth 2 (two) times daily. 60 tablet 5  ? megestrol (MEGACE) 40 MG tablet Take 40 mg by mouth daily.    ? QUEtiapine (SEROQUEL) 25 MG tablet TAKE 1/2 TABLET BY MOUTH IN THE MORNING AND TAKE 2  TABLETS AT NIGHT (Patient taking differently: Take 25 mg by mouth at bedtime.) 90 tablet 3  ? tamsulosin (FLOMAX) 0.4 MG CAPS capsule Take 1 capsule (0.4 mg total) by mouth daily. 30 capsule 3  ? oxyCODONE-acetaminophen (PERCOCET/ROXICET) 5-325 MG tablet Take 1 tablet by mouth every 4 (four) hours as needed for moderate pain or severe pain. (Patient not taking: Reported on 05/18/2021) 30 tablet 0  ? traMADol (ULTRAM) 50 MG tablet Take 50 mg by mouth every 6 (six) hours as needed for moderate pain. (Patient not taking: Reported on 05/18/2021)    ? ?No facility-administered medications prior to visit.  ? ? ?ROS: ?Review of Systems  ?Constitutional:  Positive for fatigue. Negative for appetite change and unexpected weight change.  ?HENT:  Negative for congestion, nosebleeds, sneezing, sore throat and trouble swallowing.   ?Eyes:  Negative for itching and visual disturbance.  ?Respiratory:  Negative for cough.   ?Cardiovascular:  Negative for chest pain, palpitations and leg swelling.  ?Gastrointestinal:  Negative for abdominal distention, blood in stool, diarrhea and nausea.  ?Genitourinary:  Positive for enuresis, frequency and urgency. Negative for hematuria.  ?Musculoskeletal:  Negative for back pain, gait problem, joint swelling and  neck pain.  ?Skin:  Negative for rash.  ?Neurological:  Positive for weakness. Negative for dizziness, tremors and speech difficulty.  ?Psychiatric/Behavioral:  Positive for confusion, decreased concentration and dysphoric mood. Negative for agitation, sleep disturbance and suicidal ideas. The patient is not nervous/anxious.   ? ?Objective:  ?BP 120/82 (BP Location: Left Arm, Patient Position: Sitting, Cuff Size: Large)   Pulse (!) 112   Temp 98.2 ?F (36.8 ?C) (Oral)   Ht '5\' 9"'$  (1.753 m)   Wt 145 lb 8 oz (66 kg)   SpO2 97%   BMI 21.49 kg/m?  ? ?BP Readings from Last 3 Encounters:  ?05/20/21 120/82  ?05/18/21 120/81  ?04/07/21 118/68  ? ? ?Wt Readings from Last 3 Encounters:   ?05/20/21 145 lb 8 oz (66 kg)  ?05/18/21 146 lb (66.2 kg)  ?04/07/21 150 lb 12.8 oz (68.4 kg)  ? ? ?Physical Exam ?Constitutional:   ?   General: He is not in acute distress. ?   Appearance: Normal appearance. He is well-developed.  ?   Comments: NAD  ?Eyes:  ?   Conjunctiva/sclera: Conjunctivae normal.  ?   Pupils: Pupils are equal, round, and reactive to light.  ?Neck:  ?   Thyroid: No thyromegaly.  ?   Vascular: No JVD.  ?Cardiovascular:  ?   Rate and Rhythm: Normal rate and regular rhythm.  ?   Heart sounds: Normal heart sounds. No murmur heard. ?  No friction rub. No gallop.  ?Pulmonary:  ?   Effort: Pulmonary effort is normal. No respiratory distress.  ?   Breath sounds: Normal breath sounds. No wheezing or rales.  ?Chest:  ?   Chest wall: No tenderness.  ?Abdominal:  ?   General: Bowel sounds are normal. There is no distension.  ?   Palpations: Abdomen is soft. There is no mass.  ?   Tenderness: There is no abdominal tenderness. There is no guarding or rebound.  ?Musculoskeletal:     ?   General: Normal range of motion.  ?   Cervical back: Normal range of motion.  ?Lymphadenopathy:  ?   Cervical: No cervical adenopathy.  ?Skin: ?   General: Skin is warm and dry.  ?   Findings: No rash.  ?Neurological:  ?   Mental Status: He is alert and oriented to person, place, and time.  ?   Cranial Nerves: No cranial nerve deficit.  ?   Motor: Weakness present. No abnormal muscle tone.  ?   Coordination: Coordination abnormal.  ?   Gait: Gait abnormal.  ?   Deep Tendon Reflexes: Reflexes are normal and symmetric.  ?Psychiatric:     ?   Behavior: Behavior normal.     ?   Thought Content: Thought content normal.     ?   Judgment: Judgment normal.  ? ?Ataxic ?Using a cane ?A little aphasic ? ? ?Lab Results  ?Component Value Date  ? WBC 5.1 03/18/2021  ? HGB 11.0 (L) 03/18/2021  ? HCT 33.8 (L) 03/18/2021  ? PLT 110 (L) 03/18/2021  ? GLUCOSE 115 (H) 12/31/2020  ? CHOL 147 08/26/2019  ? TRIG 180.0 (H) 08/26/2019  ? HDL 41.80  08/26/2019  ? LDLDIRECT 135.0 02/24/2017  ? [place] 70 08/26/2019  ? ALT 21 12/31/2020  ? AST 55 (H) 12/31/2020  ? NA 140 12/31/2020  ? K 3.9 12/31/2020  ? CL 103 12/31/2020  ? CREATININE 1.16 12/31/2020  ? BUN 21 12/31/2020  ? CO2 31 12/31/2020  ?  TSH 1.24 09/05/2019  ? PSA 1.75 02/24/2017  ? INR 1.2 04/17/2019  ? HGBA1C 6.6 (H) 05/29/2019  ? ? ?DG Lumbar Spine 2-3 Views ? ?Result Date: 03/22/2021 ?CLINICAL DATA:  Localization for laminectomy EXAM: LUMBAR SPINE - 2-3 VIEW COMPARISON:  03/17/2021 FINDINGS: Limited lateral intraoperative views of the lumbar spine. Similar lumbar numbering as MRI from December. Image obtained at 15:31 demonstrates linear localizing instrument posterior to L5. Subsequent image obtained at 15:33 demonstrates linear localizing instrument at the level of L4 posteriorly. Image obtained at 15:45 demonstrates linear localizing instrument posterior to the L3-L4 disc space. IMPRESSION: Limited intraoperative views of the lumbar spine obtained for localization purposes. Electronically Signed   By: Donavan Foil M.D.   On: 03/22/2021 18:00  ? ? ?Assessment & Plan:  ? ?Problem List Items Addressed This Visit   ? ? Bladder neck obstruction  ?   Flomax qd - d/c, no help ?Pt declined Urology referral ?  ?  ? BPH (benign prostatic hyperplasia)  ?   Flomax qd - d/c ?Pt declined Urol ref ?  ?  ? HTN (hypertension)  ?  BP Readings from Last 3 Encounters:  ?05/20/21 120/82  ?05/18/21 120/81  ?04/07/21 118/68  ? ?  ?  ? Loss of appetite  ?  Now on Megace 20 mg bid. Will increase to 40 mg bid ?  ?  ?  ? ? ?Meds ordered this encounter  ?Medications  ? megestrol (MEGACE) 40 MG tablet  ?  Sig: Take 1 tablet (40 mg total) by mouth 2 (two) times daily.  ?  Dispense:  60 tablet  ?  Refill:  5  ? DISCONTD: QUEtiapine (SEROQUEL) 25 MG tablet  ?  Sig: Take 1 tablet (25 mg total) by mouth at bedtime.  ?  Dispense:  90 tablet  ?  Refill:  1  ? QUEtiapine (SEROQUEL) 25 MG tablet  ?  Sig: Take 0.5 tablets (12.5 mg  total) by mouth at bedtime.  ?  Dispense:  90 tablet  ?  Refill:  1  ?  ? ? ?Follow-up: Return in about 3 months (around 08/20/2021) for a follow-up visit. ? ?Walker Kehr, MD ?

## 2021-05-20 NOTE — Assessment & Plan Note (Signed)
Flomax qd - d/c, no help ?Pt declined Urology referral ?

## 2021-05-20 NOTE — Assessment & Plan Note (Signed)
Now on Megace 20 mg bid. Will increase to 40 mg bid ?

## 2021-05-20 NOTE — Assessment & Plan Note (Signed)
Flomax qd - d/c ?Pt declined Urol ref ?

## 2021-05-20 NOTE — Assessment & Plan Note (Signed)
BP Readings from Last 3 Encounters:  ?05/20/21 120/82  ?05/18/21 120/81  ?04/07/21 118/68  ? ? ?

## 2021-05-31 ENCOUNTER — Other Ambulatory Visit: Payer: Self-pay | Admitting: Internal Medicine

## 2021-06-11 ENCOUNTER — Encounter (HOSPITAL_COMMUNITY): Payer: Self-pay | Admitting: Emergency Medicine

## 2021-06-11 ENCOUNTER — Inpatient Hospital Stay (HOSPITAL_COMMUNITY)
Admission: EM | Admit: 2021-06-11 | Discharge: 2021-06-15 | DRG: 809 | Disposition: A | Discharge status: Home Health | Payer: Medicare HMO | Attending: Internal Medicine | Admitting: Internal Medicine

## 2021-06-11 DIAGNOSIS — D7581 Myelofibrosis: Secondary | ICD-10-CM | POA: Diagnosis present

## 2021-06-11 DIAGNOSIS — K219 Gastro-esophageal reflux disease without esophagitis: Secondary | ICD-10-CM | POA: Diagnosis present

## 2021-06-11 DIAGNOSIS — I69354 Hemiplegia and hemiparesis following cerebral infarction affecting left non-dominant side: Secondary | ICD-10-CM

## 2021-06-11 DIAGNOSIS — Z79899 Other long term (current) drug therapy: Secondary | ICD-10-CM

## 2021-06-11 DIAGNOSIS — D696 Thrombocytopenia, unspecified: Secondary | ICD-10-CM | POA: Diagnosis not present

## 2021-06-11 DIAGNOSIS — Z8719 Personal history of other diseases of the digestive system: Secondary | ICD-10-CM

## 2021-06-11 DIAGNOSIS — Z803 Family history of malignant neoplasm of breast: Secondary | ICD-10-CM | POA: Diagnosis not present

## 2021-06-11 DIAGNOSIS — M6281 Muscle weakness (generalized): Secondary | ICD-10-CM | POA: Diagnosis not present

## 2021-06-11 DIAGNOSIS — S3210XA Unspecified fracture of sacrum, initial encounter for closed fracture: Secondary | ICD-10-CM | POA: Diagnosis not present

## 2021-06-11 DIAGNOSIS — R000 Tachycardia, unspecified: Secondary | ICD-10-CM | POA: Diagnosis not present

## 2021-06-11 DIAGNOSIS — M1990 Unspecified osteoarthritis, unspecified site: Secondary | ICD-10-CM | POA: Diagnosis present

## 2021-06-11 DIAGNOSIS — Z87891 Personal history of nicotine dependence: Secondary | ICD-10-CM | POA: Diagnosis not present

## 2021-06-11 DIAGNOSIS — S2242XA Multiple fractures of ribs, left side, initial encounter for closed fracture: Secondary | ICD-10-CM | POA: Diagnosis not present

## 2021-06-11 DIAGNOSIS — D649 Anemia, unspecified: Secondary | ICD-10-CM | POA: Diagnosis not present

## 2021-06-11 DIAGNOSIS — R9431 Abnormal electrocardiogram [ECG] [EKG]: Secondary | ICD-10-CM | POA: Diagnosis not present

## 2021-06-11 DIAGNOSIS — N400 Enlarged prostate without lower urinary tract symptoms: Secondary | ICD-10-CM | POA: Diagnosis present

## 2021-06-11 DIAGNOSIS — E785 Hyperlipidemia, unspecified: Secondary | ICD-10-CM | POA: Diagnosis present

## 2021-06-11 DIAGNOSIS — Z7982 Long term (current) use of aspirin: Secondary | ICD-10-CM | POA: Diagnosis not present

## 2021-06-11 DIAGNOSIS — D751 Secondary polycythemia: Secondary | ICD-10-CM | POA: Diagnosis present

## 2021-06-11 DIAGNOSIS — Z8616 Personal history of COVID-19: Secondary | ICD-10-CM

## 2021-06-11 DIAGNOSIS — D61818 Other pancytopenia: Principal | ICD-10-CM | POA: Diagnosis present

## 2021-06-11 DIAGNOSIS — R634 Abnormal weight loss: Secondary | ICD-10-CM | POA: Diagnosis not present

## 2021-06-11 DIAGNOSIS — Z800 Family history of malignant neoplasm of digestive organs: Secondary | ICD-10-CM | POA: Diagnosis not present

## 2021-06-11 DIAGNOSIS — F03918 Unspecified dementia, unspecified severity, with other behavioral disturbance: Secondary | ICD-10-CM | POA: Diagnosis present

## 2021-06-11 DIAGNOSIS — D469 Myelodysplastic syndrome, unspecified: Secondary | ICD-10-CM | POA: Diagnosis present

## 2021-06-11 DIAGNOSIS — R0602 Shortness of breath: Secondary | ICD-10-CM | POA: Diagnosis not present

## 2021-06-11 DIAGNOSIS — D479 Neoplasm of uncertain behavior of lymphoid, hematopoietic and related tissue, unspecified: Secondary | ICD-10-CM | POA: Diagnosis not present

## 2021-06-11 LAB — CBC WITH DIFFERENTIAL/PLATELET
Abs Immature Granulocytes: 0.13 10*3/uL — ABNORMAL HIGH (ref 0.00–0.07)
Basophils Absolute: 0 10*3/uL (ref 0.0–0.1)
Basophils Relative: 1 %
Eosinophils Absolute: 0.1 10*3/uL (ref 0.0–0.5)
Eosinophils Relative: 2 %
HCT: 20.6 % — ABNORMAL LOW (ref 39.0–52.0)
Hemoglobin: 6.9 g/dL — CL (ref 13.0–17.0)
Immature Granulocytes: 4 %
Lymphocytes Relative: 47 %
Lymphs Abs: 1.8 10*3/uL (ref 0.7–4.0)
MCH: 39.2 pg — ABNORMAL HIGH (ref 26.0–34.0)
MCHC: 33.5 g/dL (ref 30.0–36.0)
MCV: 117 fL — ABNORMAL HIGH (ref 80.0–100.0)
Monocytes Absolute: 0.4 10*3/uL (ref 0.1–1.0)
Monocytes Relative: 10 %
Neutro Abs: 1.3 10*3/uL — ABNORMAL LOW (ref 1.7–7.7)
Neutrophils Relative %: 36 %
Platelets: 43 10*3/uL — ABNORMAL LOW (ref 150–400)
RBC: 1.76 MIL/uL — ABNORMAL LOW (ref 4.22–5.81)
RDW: 15.5 % (ref 11.5–15.5)
WBC: 3.6 10*3/uL — ABNORMAL LOW (ref 4.0–10.5)
nRBC: 0 % (ref 0.0–0.2)

## 2021-06-11 LAB — COMPREHENSIVE METABOLIC PANEL
ALT: 24 U/L (ref 0–44)
AST: 38 U/L (ref 15–41)
Albumin: 3.1 g/dL — ABNORMAL LOW (ref 3.5–5.0)
Alkaline Phosphatase: 122 U/L (ref 38–126)
Anion gap: 12 (ref 5–15)
BUN: 29 mg/dL — ABNORMAL HIGH (ref 8–23)
CO2: 22 mmol/L (ref 22–32)
Calcium: 10.1 mg/dL (ref 8.9–10.3)
Chloride: 105 mmol/L (ref 98–111)
Creatinine, Ser: 1 mg/dL (ref 0.61–1.24)
GFR, Estimated: >60 mL/min (ref >60)
Glucose, Bld: 94 mg/dL (ref 70–99)
Potassium: 4.1 mmol/L (ref 3.5–5.1)
Sodium: 139 mmol/L (ref 135–145)
Total Bilirubin: 0.7 mg/dL (ref 0.3–1.2)
Total Protein: 6.3 g/dL — ABNORMAL LOW (ref 6.5–8.1)

## 2021-06-11 LAB — POC OCCULT BLOOD, ED: Fecal Occult Bld: NEGATIVE

## 2021-06-11 LAB — PREPARE RBC (CROSSMATCH)

## 2021-06-11 MED ORDER — METHOCARBAMOL 500 MG PO TABS
500.0000 mg | ORAL_TABLET | Freq: Four times a day (QID) | ORAL | Status: DC | PRN
  Administered 2021-06-12 – 2021-06-13 (×3): 500 mg via ORAL
  Filled 2021-06-11 (×4): qty 1

## 2021-06-11 MED ORDER — SODIUM CHLORIDE 0.9 % IV SOLN
10.0000 mL/h | Freq: Once | INTRAVENOUS | Status: DC
Start: 2021-06-11 — End: 2021-06-12

## 2021-06-11 MED ORDER — QUETIAPINE FUMARATE 25 MG PO TABS
12.5000 mg | ORAL_TABLET | Freq: Every day | ORAL | Status: DC
Start: 2021-06-11 — End: 2021-06-15
  Administered 2021-06-11 – 2021-06-14 (×4): 12.5 mg via ORAL
  Filled 2021-06-11 (×5): qty 1

## 2021-06-11 MED ORDER — MEMANTINE HCL 10 MG PO TABS
10.0000 mg | ORAL_TABLET | Freq: Two times a day (BID) | ORAL | Status: DC
  Administered 2021-06-11 – 2021-06-15 (×8): 10 mg via ORAL
  Filled 2021-06-11 (×10): qty 1

## 2021-06-11 MED ORDER — VITAMIN D 25 MCG (1000 UNIT) PO TABS
5000.0000 [IU] | ORAL_TABLET | Freq: Every day | ORAL | Status: DC
  Administered 2021-06-12 – 2021-06-15 (×4): 5000 [IU] via ORAL
  Filled 2021-06-11 (×4): qty 5

## 2021-06-11 MED ORDER — ONDANSETRON HCL 4 MG/2ML IJ SOLN: 4.0000 mg | Freq: Four times a day (QID) | INTRAMUSCULAR | Status: DC | PRN

## 2021-06-11 MED ORDER — SODIUM CHLORIDE 0.9 % IV SOLN: 10.0000 mL/h | Freq: Once | INTRAVENOUS | Status: DC

## 2021-06-11 MED ORDER — SODIUM CHLORIDE 0.9 % IV SOLN: INTRAVENOUS | Status: DC

## 2021-06-11 MED ORDER — NEURIVA PO CAPS
ORAL_CAPSULE | Freq: Every day | ORAL | Status: DC
Start: 2021-06-11 — End: 2021-06-11

## 2021-06-11 MED ORDER — ACETAMINOPHEN 325 MG PO TABS
650.0000 mg | ORAL_TABLET | Freq: Four times a day (QID) | ORAL | Status: DC | PRN
Start: 2021-06-11 — End: 2021-06-15
  Administered 2021-06-12 – 2021-06-14 (×4): 650 mg via ORAL
  Filled 2021-06-11 (×5): qty 2

## 2021-06-11 MED ORDER — TRAZODONE HCL 50 MG PO TABS
25.0000 mg | ORAL_TABLET | Freq: Every evening | ORAL | Status: DC | PRN
  Administered 2021-06-13 – 2021-06-14 (×2): 25 mg via ORAL
  Filled 2021-06-11 (×2): qty 1

## 2021-06-11 MED ORDER — ATORVASTATIN CALCIUM 10 MG PO TABS
20.0000 mg | ORAL_TABLET | ORAL | Status: DC
Start: 2021-06-12 — End: 2021-06-15
  Administered 2021-06-12 – 2021-06-14 (×2): 20 mg via ORAL
  Filled 2021-06-11 (×2): qty 2

## 2021-06-11 MED ORDER — ACETAMINOPHEN 650 MG RE SUPP: 650.0000 mg | Freq: Four times a day (QID) | RECTAL | Status: DC | PRN

## 2021-06-11 MED ORDER — B COMPLEX-C PO TABS
1.0000 | ORAL_TABLET | Freq: Every day | ORAL | Status: DC
  Administered 2021-06-12 – 2021-06-15 (×4): 1 via ORAL
  Filled 2021-06-11 (×5): qty 1

## 2021-06-11 MED ORDER — VITAMIN D3 50 MCG (2000 UT) PO CAPS: 2000.0000 [IU] | ORAL_CAPSULE | Freq: Every day | ORAL | Status: DC

## 2021-06-11 MED ORDER — PROSTATE HEALTH PO CAPS: 1.0000 | ORAL_CAPSULE | Freq: Every day | ORAL | Status: DC

## 2021-06-11 MED ORDER — MAGNESIUM HYDROXIDE 400 MG/5ML PO SUSP
30.0000 mL | Freq: Every day | ORAL | Status: DC | PRN
  Filled 2021-06-11: qty 30

## 2021-06-11 MED ORDER — DONEPEZIL HCL 10 MG PO TABS
10.0000 mg | ORAL_TABLET | Freq: Every day | ORAL | Status: DC
Start: 2021-06-11 — End: 2021-06-15
  Administered 2021-06-11 – 2021-06-14 (×4): 10 mg via ORAL
  Filled 2021-06-11 (×5): qty 1

## 2021-06-11 MED ORDER — ONDANSETRON HCL 4 MG PO TABS: 4.0000 mg | ORAL_TABLET | Freq: Four times a day (QID) | ORAL | Status: DC | PRN

## 2021-06-11 NOTE — Progress Notes (Signed)
PHARMACIST - PHYSICIAN ORDER COMMUNICATION ? ?CONCERNING: P&T Medication Policy on Herbal Medications ? ?DESCRIPTION:  This patient?s order for:  Neuriva and Prostate [employer]  has been noted. ? ?This product(s) is classified as an ?herbal? or natural product. ?Due to a lack of definitive safety studies or FDA approval, nonstandard manufacturing practices, plus the potential risk of unknown drug-drug interactions while on inpatient medications, the Pharmacy and Therapeutics Committee does not permit the use of ?herbal? or natural products of this type within [facility]. ?  ?ACTION TAKEN: ?The pharmacy department is unable to verify this order at this time and your patient has been informed of this safety policy. ?Please reevaluate patient?s clinical condition at discharge and address if the herbal or natural product(s) should be resumed at that time.  ?

## 2021-06-11 NOTE — H&P (Addendum)
?  ?  ?[place] ? ? ?PATIENT NAME: Henry [name]   ? ?MR#:  [id number] ? ?DATE OF BIRTH:  27-Oct-1940 ? ?DATE OF ADMISSION:  06/11/2021 ? ?PRIMARY CARE PHYSICIAN: [name], [name], MD  ? ?Patient is coming from: Home ? ?REQUESTING/REFERRING PHYSICIAN: Pattricia Boss, MD  ? ?CHIEF COMPLAINT:  ? ?Chief Complaint  ?Patient presents with  ? Anemia  ? ? ?HISTORY OF PRESENT ILLNESS:  ?Henry [name] is a 81 y.o. male with medical history significant for GERD, osteoarthritis and polycythemia as well as CVA, who presented to the ER with acute onset of generalized weakness and fatigue with no energy.  He denies any nausea or vomiting or heartburn or abdominal pain, diarrhea or melena or bright red bleeding per rectum.  No fever or chills.  No dysuria, oliguria or hematuria or flank pain.  No cough or wheezing or hemoptysis.  No other bleeding diathesis.  He denied any chest pain or palpitations.  No headache or dizziness or blurred vision.  He was seen in urgent care today and found to have anemia with hemoglobin of 6.  He was therefore sent to the emergency room for further assessment.   ? ?ED Course: Upon arrival to the ER heart rate is 116 with respiratory to 2012 with otherwise normal vital signs.  Labs revealed a BUN of 29, albumin of 3.1 and total protein 6.3 with otherwise normal CMP.  H&H were 6.9 and 20.6 down from 11 and 33.8.  It macrocytosis of 117 and low platelets of 43 and low WBC of 3.6 down from 5.1 in January of this year.  Blood group was O+ with negative antibody screen.  Stool Hemoccult came back negative. ? ?EKG as reviewed by me : EKG showed sinus tachycardia with rate 107.  It showed inferior Q waves. ? ?The patient was typed and crossmatched will be transfused 2 units of packed blood cells.  He will be admitted to a medical telemetry observation bed for further evaluation and management. ?PAST MEDICAL HISTORY:  ? ?Past Medical History:  ?Diagnosis Date  ? Colon polyps   ? COVID-19   ? Elevated MCV  2011  ? GERD (gastroesophageal reflux disease) 2012  ? LBP (low back pain)   ? 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 Osteoarthritis   ? Pneumonia   ? Polycythemia 2011  ? Stroke [facility])   ? ? ?PAST SURGICAL HISTORY:  ? ?Past Surgical History:  ?Procedure Laterality Date  ? APPENDECTOMY    ? COLONOSCOPY    ? HERNIA REPAIR    ? march 2019  ? INGUINAL HERNIA REPAIR Left 2015  ? IR CT HEAD LTD  04/17/2019  ? IR PERCUTANEOUS ART THROMBECTOMY/INFUSION INTRACRANIAL INC DIAG ANGIO  04/17/2019  ? LUMBAR LAMINECTOMY/DECOMPRESSION MICRODISCECTOMY N/A 05/20/2016  ? Procedure: Lumbar Three-Four,Lunmbar Four-Five,Lumbar Five-Sacral One Laminectomy/Foraminotomy;  Surgeon: Kristeen Miss, MD;  Location: [place];  Service: Neurosurgery;  Laterality: N/A;  ? LUMBAR LAMINECTOMY/DECOMPRESSION MICRODISCECTOMY Left 03/22/2021  ? Procedure: Left Lumbar three-four Laminectomy and foraminotomy;  Surgeon: Kristeen Miss, MD;  Location: [place];  Service: Neurosurgery;  Laterality: Left;  ? POLYPECTOMY    ? RADIOLOGY WITH ANESTHESIA N/A 04/17/2019  ? Procedure: IR WITH ANESTHESIA;  Surgeon: Luanne Bras, MD;  Location: [place];  Service: Radiology;  Laterality: N/A;  ? ? ?SOCIAL HISTORY:  ? ?Social History  ? ?Tobacco Use  ? Smoking status: Former  ?  Packs/day: 1.00  ?  Types: Cigarettes  ?  Quit date: 05/24/1976  ?  Years since quitting: 45.0  ? Smokeless tobacco: Never  ?Substance Use Topics  ? Alcohol use: No  ? ? ?FAMILY HISTORY:  ? ?Family History  ?Problem Relation Age of Onset  ? Colon cancer Sister 9  ? Breast cancer Other   ?     1st degree relative  ? Stroke Neg Hx   ? Esophageal cancer Neg Hx   ? Pancreatic cancer Neg Hx   ? Prostate cancer Neg Hx   ? Rectal cancer Neg Hx   ? Stomach cancer Neg Hx   ? ? ?DRUG ALLERGIES:  ?No Known Allergies ? ?REVIEW OF SYSTEMS:  ? ?ROS ?As per history of present illness. All pertinent systems were reviewed above. Constitutional, HEENT, cardiovascular, respiratory, GI, GU,  musculoskeletal, neuro, psychiatric, endocrine, integumentary and hematologic systems were reviewed and are otherwise negative/unremarkable except for positive findings mentioned above in the HPI. ? ? ?MEDICATIONS AT HOME:  ? ?Prior to Admission medications   ?Medication Sig Start Date End Date Taking? Authorizing Provider  ?acetaminophen (TYLENOL) 325 MG tablet Take 650 mg by mouth every 6 (six) hours as needed for moderate pain.    [provider]  ?aspirin EC 81 MG tablet Take 1 tablet (81 mg total) by mouth daily. Swallow whole. 01/06/20   Frann Rider, NP  ?atorvastatin (LIPITOR) 40 MG tablet TAKE ONE (1) TABLET BY MOUTH EVERY DAY AT 6PM. ?Patient taking differently: Take 20 mg by mouth every other day. 11/16/20   [name], [name], MD  ?B Complex-Folic Acid (B COMPLEX PLUS) TABS Take 1 tablet by mouth daily. 08/08/19   [name], [name], MD  ?celecoxib (CELEBREX) 200 MG capsule One po qd or BID prn pain 01/11/21   [name], [name], MD  ?Cholecalciferol (VITAMIN D) 125 MCG (5000 UT) CAPS Take 5,000 Units by mouth daily.    [provider]  ?Cholecalciferol (VITAMIN D3) 50 MCG (2000 UT) capsule Take 1 capsule (2,000 Units total) by mouth daily. 05/29/19   [name], [name], MD  ?donepezil (ARICEPT) 10 MG tablet Take 1 tablet (10 mg total) by mouth at bedtime. 01/07/21   Frann Rider, NP  ?megestrol (MEGACE) 40 MG tablet Take 1 tablet (40 mg total) by mouth 2 (two) times daily. 05/20/21   [name], [name], MD  ?memantine (NAMENDA) 10 MG tablet TAKE 1 TABLET BY MOUTH TWICE DAILY. 05/31/21   [name], [name], MD  ?methocarbamol (ROBAXIN) 500 MG tablet Take 1 tablet (500 mg total) by mouth every 6 (six) hours as needed for muscle spasms. 03/23/21   Kristeen Miss, MD  ?Misc Natural Products (NEURIVA PO) Take 1 capsule by mouth daily.    [provider]  ?Misc Natural Products (PROSTATE HEALTH) CAPS Take 1 capsule by mouth in the morning and at bedtime.    [provider]  ?QUEtiapine (SEROQUEL) 25 MG tablet Take 0.5 tablets (12.5 mg total) by mouth at bedtime. 05/20/21   [name], [name], MD  ? ?  ? ?VITAL SIGNS:  ?Blood pressure 110/64, pulse 88, temperature 98.6 ?F (37 ?C), temperature source Oral, resp. rate 17, SpO2 100 %. ? ?PHYSICAL EXAMINATION:  ?Physical Exam ? ?GENERAL:  81 y.o.-year-old patient lying in the bed with no acute distress.  ?EYES: Pupils equal, round, reactive to light and accommodation. No scleral icterus.  Positive pallor.  Extraocular muscles intact.  ?HEENT: Head atraumatic, normocephalic. Oropharynx and nasopharynx clear.  ?NECK:  Supple, no jugular venous distention. No thyroid enlargement,  no tenderness.  ?LUNGS: Normal breath sounds bilaterally, no wheezing, rales,rhonchi or crepitation. No use of accessory muscles of respiration.  ?CARDIOVASCULAR: Regular rate and rhythm, S1, S2 normal. No murmurs, rubs, or gallops.  ?ABDOMEN: Soft, nondistended, nontender. Bowel sounds present. No organomegaly or mass.  ?EXTREMITIES: No pedal edema, cyanosis, or clubbing.  ?NEUROLOGIC: Cranial nerves II through XII are intact. Muscle strength 5/5 in all extremities. Sensation intact. Gait not checked.  ?PSYCHIATRIC: The patient is alert and oriented x 3.  Normal affect and good eye contact. ?SKIN: No obvious rash, lesion, or ulcer.  ? ?LABORATORY PANEL:  ? ?CBC ?Recent Labs  ?Lab 06/11/21 ?1927  ?WBC 3.6*  ?HGB 6.9*  ?HCT 20.6*  ?PLT 43*  ? ?------------------------------------------------------------------------------------------------------------------ ? ?Chemistries  ?Recent Labs  ?Lab 06/11/21 ?1919  ?NA 139  ?K 4.1  ?CL 105  ?CO2 22  ?GLUCOSE 94  ?BUN 29*  ?CREATININE 1.00  ?CALCIUM 10.1  ?AST 38  ?ALT 24  ?ALKPHOS 122  ?BILITOT 0.7  ? ?------------------------------------------------------------------------------------------------------------------ ? ?Cardiac Enzymes ?No results for input(s): TROPONINI in the last 168  hours. ?------------------------------------------------------------------------------------------------------------------ ? ?RADIOLOGY:  ?No results found. ? ? ? ?IMPRESSION AND PLAN:  ?Assessment and Plan: ?* Pancytopenia ([place]) ?- The pati

## 2021-06-11 NOTE — ED Triage Notes (Signed)
Patient sent to ED from urgent care in [place] for evaluation of anemia, patient initially went to urgent care with complaint of generalized weakness for the passed week. Patient denies known source of bleeding, takes a single '81mg'$  aspirin daily. Patient is alert, oriented, and in no apparent distress at this time. ?

## 2021-06-11 NOTE — ED Provider Notes (Addendum)
?[place] ?Provider Note ? ? ?CSN: [id number] ?Arrival date & time: 06/11/21  1805 ? ?  ? ?History ? ?Chief Complaint  ?Patient presents with  ? Anemia  ? ? ?Henry [name] is a 81 y.o. male. ? ?HPI ?81 yo male presents today with anemia.  Patient states he has no energy and was generally weak.  Patient seen by [facility] and had blood count noted to be low.  Patient and wife deny bleeding.  No blood in stool.  Take aspirin 81 mg. ? ?  ? ?Home Medications ?Prior to Admission medications   ?Medication Sig Start Date End Date Taking? Authorizing Provider  ?acetaminophen (TYLENOL) 325 MG tablet Take 650 mg by mouth every 6 (six) hours as needed for moderate pain.    [provider]  ?aspirin EC 81 MG tablet Take 1 tablet (81 mg total) by mouth daily. Swallow whole. 01/06/20   Frann Rider, NP  ?atorvastatin (LIPITOR) 40 MG tablet TAKE ONE (1) TABLET BY MOUTH EVERY DAY AT 6PM. ?Patient taking differently: Take 20 mg by mouth every other day. 11/16/20   [name], [name], MD  ?B Complex-Folic Acid (B COMPLEX PLUS) TABS Take 1 tablet by mouth daily. 08/08/19   [name], [name], MD  ?celecoxib (CELEBREX) 200 MG capsule One po qd or BID prn pain 01/11/21   [name], [name], MD  ?Cholecalciferol (VITAMIN D) 125 MCG (5000 UT) CAPS Take 5,000 Units by mouth daily.    [provider]  ?Cholecalciferol (VITAMIN D3) 50 MCG (2000 UT) capsule Take 1 capsule (2,000 Units total) by mouth daily. 05/29/19   [name], [name], MD  ?donepezil (ARICEPT) 10 MG tablet Take 1 tablet (10 mg total) by mouth at bedtime. 01/07/21   Frann Rider, NP  ?megestrol (MEGACE) 40 MG tablet Take 1 tablet (40 mg total) by mouth 2 (two) times daily. 05/20/21   [name], [name], MD  ?memantine (NAMENDA) 10 MG tablet TAKE 1 TABLET BY MOUTH TWICE DAILY. 05/31/21   [name], [name], MD  ?methocarbamol (ROBAXIN) 500 MG tablet Take 1 tablet (500 mg total) by mouth every 6 (six) hours as  needed for muscle spasms. 03/23/21   Kristeen Miss, MD  ?Misc Natural Products (NEURIVA PO) Take 1 capsule by mouth daily.    [provider]  ?Misc Natural Products (PROSTATE HEALTH) CAPS Take 1 capsule by mouth in the morning and at bedtime.    [provider]  ?QUEtiapine (SEROQUEL) 25 MG tablet Take 0.5 tablets (12.5 mg total) by mouth at bedtime. 05/20/21   [name], [name], MD  ?   ? ?Allergies    ?Patient has no known allergies.   ? ?Review of Systems   ?Review of Systems  ?Constitutional:  Negative for chills and fever.  ?Eyes: Negative.   ?Respiratory: Negative.    ?Cardiovascular: Negative.   ?Gastrointestinal: Negative.   ?Genitourinary: Negative.   ?Neurological:  Positive for weakness. Negative for tremors.  ?All other systems reviewed and are negative. ? ?Physical Exam ?Updated Vital Signs ?BP 113/73   Pulse 99   Temp 99.3 ?F (37.4 ?C) (Oral)   Resp (!) 25   SpO2 97%  ?Physical Exam ?Vitals and nursing note reviewed.  ?Constitutional:   ?   Appearance: Normal appearance.  ?HENT:  ?   Head: Normocephalic.  ?   Right Ear: External ear normal.  ?   Left Ear: External ear normal.  ?   Nose: Nose normal.  ?  Mouth/Throat:  ?   Pharynx: Oropharynx is clear.  ?Eyes:  ?   Pupils: Pupils are equal, round, and reactive to light.  ?Cardiovascular:  ?   Rate and Rhythm: Tachycardia present.  ?Pulmonary:  ?   Effort: Pulmonary effort is normal.  ?   Breath sounds: Normal breath sounds.  ?Abdominal:  ?   General: Abdomen is flat.  ?Musculoskeletal:     ?   General: Normal range of motion.  ?   Cervical back: Normal range of motion.  ?Skin: ?   General: Skin is warm and dry.  ?   Capillary Refill: Capillary refill takes less than 2 seconds.  ?   Coloration: Skin is pale.  ?Neurological:  ?   General: No focal deficit present.  ?   Mental Status: He is alert.  ?Psychiatric:     ?   Mood and Affect: Mood normal.  ? ? ?ED Results / Procedures / Treatments   ?Labs ?(all labs ordered are listed,  but only abnormal results are displayed) ?Labs Reviewed  ?COMPREHENSIVE METABOLIC PANEL - Abnormal; Notable for the following components:  ?    Result Value  ? BUN 29 (*)   ? Total Protein 6.3 (*)   ? Albumin 3.1 (*)   ? All other components within normal limits  ?CBC WITH DIFFERENTIAL/PLATELET - Abnormal; Notable for the following components:  ? WBC 3.6 (*)   ? RBC 1.76 (*)   ? Hemoglobin 6.9 (*)   ? HCT 20.6 (*)   ? MCV 117.0 (*)   ? MCH 39.2 (*)   ? Platelets 43 (*)   ? Neutro Abs 1.3 (*)   ? Abs Immature Granulocytes 0.13 (*)   ? All other components within normal limits  ?POC OCCULT BLOOD, ED  ?POC OCCULT BLOOD, ED  ?TYPE AND SCREEN  ?PREPARE RBC (CROSSMATCH)  ?PREPARE RBC (CROSSMATCH)  ? ? ?EKG ?EKG Interpretation ? ?Date/Time:  Friday June 11 2021 20:24:58 EDT ?Ventricular Rate:  107 ?PR Interval:  165 ?QRS Duration: 85 ?QT Interval:  345 ?QTC Calculation: 461 ?R Axis:   -9 ?Text Interpretation: Sinus tachycardia Inferior infarct, old Confirmed by Pattricia Boss [phone]) on 06/11/2021 9:01:40 PM ? ?Radiology ?No results found. ? ?Procedures ?[facility] KitchenCritical Care ?Performed by: Pattricia Boss, MD ?Authorized by: Pattricia Boss, MD  ? ?Critical care provider statement:  ?  Critical care time (minutes):  30 ?  Critical care end time:  06/11/2021 9:34 PM ?  Critical care was time spent personally by me on the following activities:  Development of treatment plan with patient or surrogate, discussions with consultants, evaluation of patient's response to treatment, examination of patient, ordering and review of laboratory studies, ordering and review of radiographic studies, ordering and performing treatments and interventions, pulse oximetry, re-evaluation of patient's condition and review of old charts  ? ? ?Medications Ordered in ED ?Medications  ?0.9 %  sodium chloride infusion (has no administration in time range)  ?0.9 %  sodium chloride infusion (has no administration in time range)  ? ? ?ED Course/ Medical Decision  Making/ A&P ?  ?                        ?Medical Decision Making ?81 year old male presents today with anemia.  He reports that he has had increasing weakness and was seen and evaluated for this today.  He was found to have a hemoglobin of 6.  He was sent to the emergency department  for further evaluation.  Patient denies any bleeding.  He is on aspirin.  Denies any dark tarry stools.  Hemoccult is negative here ?Unclear etiology of new anemia, reviewed old with hemoglobin of 11 and now is 6 ?Platelets 110 2 43,000 ?No active bleeding noted by patient, Hemoccult negative ?EKG stable ?Patient is tachycardic and has had symptoms of generalized weakness ?Plan admission for transfusion further evaluation-discussed with Dr. Sidney Ace who will see for admission ? ?Amount and/or Complexity of Data Reviewed ?Independent Historian: spouse ?   Details: Reviewed documentation from [facility] urgent care no anticoagulants noted EKG reviewed CBC with hemoglobin of 7 and platelets of 2000 reviewed ?External Data Reviewed: labs, ECG and notes. ?Labs: ordered. Decision-making details documented in ED Course. ?Radiology: ordered and independent interpretation performed. Decision-making details documented in ED Course. ?ECG/medicine tests: ordered and independent interpretation performed. Decision-making details documented in ED Course. ? ?Risk ?Prescription drug management. ?Decision regarding hospitalization. ? ? ? ? ? ? ? ? ? ? ?Final Clinical Impression(s) / ED Diagnoses ?Final diagnoses:  ?Symptomatic anemia  ? ? ?Rx / DC Orders ?ED Discharge Orders   ? ? None  ? ?  ? ? ?  ?Pattricia Boss, MD ?06/11/21 2132 ? ?  ?Pattricia Boss, MD ?06/11/21 2134 ? ?

## 2021-06-11 NOTE — ED Provider Triage Note (Signed)
Emergency Medicine Provider Triage Evaluation Note ? ?Henry [name] , a 81 y.o. male  was evaluated in triage.  Pt complains of generalized weakness over the past month. Denies any melena, hematuria, or known source of bleeding. The patient is on '81mg'$  of ASA, but denies any blood thinner use. Denies any lightheadedness, chest pain, or SOB. Wife reports that the UC sent them over for a hemoglobin of 7. ? ?Review of Systems  ?Positive: See above ?Negative: See above ? ?Physical Exam  ?BP 122/78 (BP Location: Right Arm)   Pulse (!) 116   Temp 99.3 ?F (37.4 ?C) (Oral)   Resp (!) 22   SpO2 94%  ?Gen:   Awake, no distress   ?Resp:  Normal effort  ?MSK:   Moves extremities without difficulty  ?Other:  tachycardic ? ?Medical Decision Making  ?Medically screening exam initiated at 7:27 PM.  Appropriate orders placed.  THOMOS DOMINE was informed that the remainder of the evaluation will be completed by another provider, this initial triage assessment does not replace that evaluation, and the importance of remaining in the ED until their evaluation is complete. ? ?Labs ordered. Tachycardic, charge aware the patient needs to be roomed now.  ?  ?Sherrell Puller, PA-C ?06/11/21 1929 ? ?

## 2021-06-12 ENCOUNTER — Observation Stay (HOSPITAL_COMMUNITY): Payer: Medicare HMO

## 2021-06-12 ENCOUNTER — Other Ambulatory Visit: Payer: Self-pay

## 2021-06-12 ENCOUNTER — Encounter (HOSPITAL_COMMUNITY): Payer: Self-pay | Admitting: Family Medicine

## 2021-06-12 DIAGNOSIS — E785 Hyperlipidemia, unspecified: Secondary | ICD-10-CM | POA: Diagnosis present

## 2021-06-12 DIAGNOSIS — D649 Anemia, unspecified: Principal | ICD-10-CM | POA: Diagnosis present

## 2021-06-12 DIAGNOSIS — R0602 Shortness of breath: Secondary | ICD-10-CM | POA: Diagnosis not present

## 2021-06-12 DIAGNOSIS — D469 Myelodysplastic syndrome, unspecified: Secondary | ICD-10-CM | POA: Diagnosis present

## 2021-06-12 DIAGNOSIS — Z7982 Long term (current) use of aspirin: Secondary | ICD-10-CM | POA: Diagnosis not present

## 2021-06-12 DIAGNOSIS — Z803 Family history of malignant neoplasm of breast: Secondary | ICD-10-CM | POA: Diagnosis not present

## 2021-06-12 DIAGNOSIS — R634 Abnormal weight loss: Secondary | ICD-10-CM | POA: Diagnosis not present

## 2021-06-12 DIAGNOSIS — D7581 Myelofibrosis: Secondary | ICD-10-CM | POA: Diagnosis present

## 2021-06-12 DIAGNOSIS — F03918 Unspecified dementia, unspecified severity, with other behavioral disturbance: Secondary | ICD-10-CM | POA: Diagnosis present

## 2021-06-12 DIAGNOSIS — Z800 Family history of malignant neoplasm of digestive organs: Secondary | ICD-10-CM | POA: Diagnosis not present

## 2021-06-12 DIAGNOSIS — D61818 Other pancytopenia: Secondary | ICD-10-CM

## 2021-06-12 DIAGNOSIS — Z8616 Personal history of COVID-19: Secondary | ICD-10-CM | POA: Diagnosis not present

## 2021-06-12 DIAGNOSIS — S3210XA Unspecified fracture of sacrum, initial encounter for closed fracture: Secondary | ICD-10-CM | POA: Diagnosis not present

## 2021-06-12 DIAGNOSIS — N400 Enlarged prostate without lower urinary tract symptoms: Secondary | ICD-10-CM | POA: Diagnosis present

## 2021-06-12 DIAGNOSIS — M1990 Unspecified osteoarthritis, unspecified site: Secondary | ICD-10-CM | POA: Diagnosis present

## 2021-06-12 DIAGNOSIS — I69354 Hemiplegia and hemiparesis following cerebral infarction affecting left non-dominant side: Secondary | ICD-10-CM | POA: Diagnosis not present

## 2021-06-12 DIAGNOSIS — Z8719 Personal history of other diseases of the digestive system: Secondary | ICD-10-CM | POA: Diagnosis not present

## 2021-06-12 DIAGNOSIS — K219 Gastro-esophageal reflux disease without esophagitis: Secondary | ICD-10-CM | POA: Diagnosis present

## 2021-06-12 DIAGNOSIS — Z79899 Other long term (current) drug therapy: Secondary | ICD-10-CM | POA: Diagnosis not present

## 2021-06-12 DIAGNOSIS — D751 Secondary polycythemia: Secondary | ICD-10-CM | POA: Diagnosis present

## 2021-06-12 DIAGNOSIS — Z87891 Personal history of nicotine dependence: Secondary | ICD-10-CM | POA: Diagnosis not present

## 2021-06-12 DIAGNOSIS — S2242XA Multiple fractures of ribs, left side, initial encounter for closed fracture: Secondary | ICD-10-CM | POA: Diagnosis not present

## 2021-06-12 LAB — PREPARE RBC (CROSSMATCH)

## 2021-06-12 LAB — RETICULOCYTES
Immature Retic Fract: 16 % — ABNORMAL HIGH (ref 2.3–15.9)
RBC.: 2.08 MIL/uL — ABNORMAL LOW (ref 4.22–5.81)
Retic Count, Absolute: 20.6 10*3/uL (ref 19.0–186.0)
Retic Ct Pct: 1 % (ref 0.4–3.1)

## 2021-06-12 LAB — CBC
HCT: 22.1 % — ABNORMAL LOW (ref 39.0–52.0)
Hemoglobin: 7.3 g/dL — ABNORMAL LOW (ref 13.0–17.0)
MCH: 35.3 pg — ABNORMAL HIGH (ref 26.0–34.0)
MCHC: 33 g/dL (ref 30.0–36.0)
MCV: 106.8 fL — ABNORMAL HIGH (ref 80.0–100.0)
Platelets: 31 10*3/uL — ABNORMAL LOW (ref 150–400)
RBC: 2.07 MIL/uL — ABNORMAL LOW (ref 4.22–5.81)
WBC: 3.5 10*3/uL — ABNORMAL LOW (ref 4.0–10.5)
nRBC: 0.6 % — ABNORMAL HIGH (ref 0.0–0.2)

## 2021-06-12 LAB — BASIC METABOLIC PANEL
Anion gap: 12 (ref 5–15)
BUN: 24 mg/dL — ABNORMAL HIGH (ref 8–23)
CO2: 22 mmol/L (ref 22–32)
Calcium: 9.4 mg/dL (ref 8.9–10.3)
Chloride: 107 mmol/L (ref 98–111)
Creatinine, Ser: 0.89 mg/dL (ref 0.61–1.24)
GFR, Estimated: >60 mL/min (ref >60)
Glucose, Bld: 76 mg/dL (ref 70–99)
Potassium: 3.8 mmol/L (ref 3.5–5.1)
Sodium: 141 mmol/L (ref 135–145)

## 2021-06-12 LAB — TECHNOLOGIST SMEAR REVIEW

## 2021-06-12 LAB — IRON AND TIBC
Iron: 153 ug/dL (ref 45–182)
Saturation Ratios: 68 % — ABNORMAL HIGH (ref 17.9–39.5)
TIBC: 224 ug/dL — ABNORMAL LOW (ref 250–450)
UIBC: 71 ug/dL

## 2021-06-12 LAB — T4, FREE: Free T4: 0.87 ng/dL (ref 0.61–1.12)

## 2021-06-12 LAB — FERRITIN: Ferritin: 491 ng/mL — ABNORMAL HIGH (ref 24–336)

## 2021-06-12 LAB — TSH: TSH: 1.384 u[IU]/mL (ref 0.350–4.500)

## 2021-06-12 LAB — LACTATE DEHYDROGENASE
LDH: 412 U/L — ABNORMAL HIGH (ref 98–192)
LDH: 382 U/L — ABNORMAL HIGH (ref 98–192)

## 2021-06-12 LAB — FOLATE: Folate: 29 ng/mL (ref >5.9)

## 2021-06-12 LAB — VITAMIN B12: Vitamin B-12: 933 pg/mL — ABNORMAL HIGH (ref 180–914)

## 2021-06-12 MED ORDER — SODIUM CHLORIDE 0.9% IV SOLUTION: Freq: Once | INTRAVENOUS | Status: DC

## 2021-06-12 MED ORDER — IOHEXOL 300 MG/ML  SOLN
100.0000 mL | Freq: Once | INTRAMUSCULAR | Status: AC | PRN
  Administered 2021-06-12: 100 mL via INTRAVENOUS

## 2021-06-12 MED ORDER — SODIUM CHLORIDE 0.9 % IV SOLN: INTRAVENOUS | Status: AC

## 2021-06-12 MED ORDER — ENSURE ENLIVE PO LIQD
237.0000 mL | Freq: Two times a day (BID) | ORAL | Status: DC
  Administered 2021-06-12 – 2021-06-13 (×3): 237 mL via ORAL

## 2021-06-12 MED ORDER — FAMOTIDINE 20 MG PO TABS
40.0000 mg | ORAL_TABLET | Freq: Every day | ORAL | Status: DC
  Administered 2021-06-12 – 2021-06-15 (×4): 40 mg via ORAL
  Filled 2021-06-12 (×4): qty 2

## 2021-06-12 MED ORDER — ASPIRIN EC 81 MG PO TBEC: 81.0000 mg | DELAYED_RELEASE_TABLET | Freq: Every day | ORAL | Status: DC

## 2021-06-12 MED ORDER — IOHEXOL 9 MG/ML PO SOLN
ORAL | Status: AC
  Administered 2021-06-12: 500 mL
  Filled 2021-06-12: qty 1000

## 2021-06-12 NOTE — ED Notes (Signed)
Pt resting in stretcher with wife at bedside, denies needs, VSS and no distress noted. ?

## 2021-06-12 NOTE — Assessment & Plan Note (Signed)
-   The patient was typed and crossmatched will be transfused units of packed red blood cells. ?- We will follow posttransfusion H&H. ?- We will obtain anemia work-up. ?

## 2021-06-12 NOTE — Evaluation (Signed)
Occupational Therapy Evaluation ?Patient Details ?Name: Henry [name] ?MRN: [id number] ?DOB: 11-20-1940 ?Today's Date: 06/12/2021 ? ? ?History of Present Illness 81 yo male presenting to ED on 4/7 with acute onset of generalized weakness and fatigue. Pt found with anemia and new onset of pancytopenia, Hgb of 6.9.  PMH including GERD, osteoarthritis and polycythemia, COVID-19, and CVA (2021).  ? ?Clinical Impression ?  ?PTA, pt was living with his wife and was performing BADLs and using quad cane for mobility; wife able to assist as needed and reports he has been receiving HHPT and HHOT. Pt presenting with decreased strength and activity tolerance compared to his PLOF. Pt performing LB ADLs with Min-Mod A and functional mobility with Min Guard-Min A with quad cane. Pt would benefit from further acute OT to facilitate safe dc. Recommend dc to home with HHOT for further OT to optimize safety, independence with ADLs, and return to PLOF.   ?   ? ?Recommendations for follow up therapy are one component of a multi-disciplinary discharge planning process, led by the attending physician.  Recommendations may be updated based on patient status, additional functional criteria and insurance authorization.  ? ?Follow Up Recommendations ? Home health OT  ?  ?Assistance Recommended at Discharge Frequent or constant Supervision/Assistance  ?Patient can return home with the following   ? ?  ?Functional Status Assessment ? Patient has had a recent decline in their functional status and demonstrates the ability to make significant improvements in function in a reasonable and predictable amount of time.  ?Equipment Recommendations ? None recommended by OT  ?  ?Recommendations for Other Services PT consult ? ? ?  ?Precautions / Restrictions Precautions ?Precautions: Fall  ? ?  ? ?Mobility Bed Mobility ?Overal bed mobility: Needs Assistance ?Bed Mobility: Supine to Sit, Sit to Supine ?  ?  ?Supine to sit: Min assist ?Sit to supine: Min  assist ?  ?General bed mobility comments: Min A for elevating trunk into sitting. Min A for bringing LLE over EOB in return tin supine ?  ? ?Transfers ?Overall transfer level: Needs assistance ?Equipment used: Quad cane ?Transfers: Sit to/from Stand ?Sit to Stand: Min guard, Min assist ?  ?  ?  ?  ?  ?General transfer comment: Pt performing sit<>Stand with Min guard A and then Min A for gaining standing balance ?  ? ?  ?Balance Overall balance assessment: Needs assistance ?Sitting-balance support: No upper extremity supported, Feet supported ?Sitting balance-Leahy Scale: Good ?  ?  ?Standing balance support: Single extremity supported, During functional activity ?Standing balance-Leahy Scale: Poor ?  ?  ?  ?  ?  ?  ?  ?  ?  ?  ?  ?  ?   ? ?ADL either performed or assessed with clinical judgement  ? ?ADL Overall ADL's : Needs assistance/impaired ?Eating/Feeding: Minimal assistance;Sitting ?  ?Grooming: Minimal assistance;Sitting ?  ?Upper Body Bathing: Minimal assistance;Sitting ?  ?Lower Body Bathing: Moderate assistance;Sit to/from stand ?  ?Upper Body Dressing : Minimal assistance;Moderate assistance;Sitting ?  ?Lower Body Dressing: Minimal assistance;Moderate assistance ?Lower Body Dressing Details (indicate cue type and reason): Pt donning his shoe while sitting at EOB. Bending forward to slip then on and then tuck in heel. No LOB. Assistance from wife for pulling up depends in standing. Min A for gaining standing balance ?Toilet Transfer: Min guard;Minimal assistance;Ambulation [facility]) ?Toilet Transfer Details (indicate cue type and reason): Pt performing sit<>Stand with Min guard A and then Min A for gaining standing  balance ?  ?  ?  ?  ?Functional mobility during ADLs: Min guard;Minimal assistance;Cane ?General ADL Comments: Pt presenitng with decreased strength and activity tolerance compared to his baseline.  ? ? ? ?Vision   ?   ?   ?Perception   ?  ?Praxis   ?  ? ?Pertinent Vitals/Pain Pain Assessment ?Pain  Assessment: No/denies pain  ? ? ? ?Hand Dominance Right ?  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 Pt abel to being hand to mouth with gravity eliminated. Poor finger AROM. ?LUE Coordination: decreased fine motor;decreased gross motor ?  ?Lower Extremity Assessment ?Lower Extremity Assessment: Defer to PT evaluation ?  ?Cervical / Trunk Assessment ?Cervical / Trunk Assessment: Kyphotic ?  ?Communication Communication ?Communication: No difficulties ?  ?Cognition Arousal/Alertness: Awake/alert ?Behavior During Therapy: [facility] for tasks assessed/performed, Flat affect ?Overall Cognitive Status: Within Functional Limits for tasks assessed ?  ?  ?  ?  ?  ?  ?  ?  ?  ?  ?  ?  ?  ?  ?  ?  ?General Comments: Slightly flat. Reports he is tired ?  ?  ?General Comments  VSS on RA. Wife presnet at 55 of session ? ?  ?Exercises   ?  ?Shoulder Instructions    ? ? ?Home Living Family/patient expects to be discharged to:: Private residence ?Living Arrangements: Spouse/significant other ?Available Help at Discharge: Family;Available 24 hours/day ?Type of Home: House ?Home Access: Ramped entrance ?  ?  ?Home Layout: One level ?  ?  ?Bathroom Shower/Tub: Walk-in shower ?  ?Bathroom Toilet: Standard ?Bathroom Accessibility: Yes ?  ?Home Equipment: Cane - single point;Grab bars - tub/shower;Grab bars - toilet;Wheelchair - manual;Shower seat ?  ?  ?  ? ?  ?Prior Functioning/Environment Prior Level of Function : Independent/Modified Independent ?  ?  ?  ?  ?  ?  ?Mobility Comments: Uses SPC for mobility ?ADLs Comments: Pt has been performing BADLs. Wife assists as needed and performs IADLs. Prior to recent weakness, was planning on going to [employer] to perform exercises ?  ? ?  ?  ?OT Problem List: Decreased strength;Decreased range of motion;Decreased activity tolerance;Impaired balance (sitting and/or  standing) ?  ?   ?OT Treatment/Interventions: Self-care/ADL training;Therapeutic exercise;DME and/or AE instruction;Energy conservation;Therapeutic activities;Patient/family education  ?  ?OT Goals(Current goals can be found in the care plan section) Acute Rehab OT Goals ?Patient Stated Goal: Go home ?OT Goal Formulation: With patient/family ?Time For Goal Achievement: 06/26/21 ?Potential to Achieve Goals: Good  ?OT Frequency: Min 2X/week ?  ? ?Co-evaluation   ?  ?  ?  ?  ? ?  ?AM-PAC OT "6 Clicks" Daily Activity     ?Outcome Measure Help from another person eating meals?: A Little ?Help from another person taking care of personal grooming?: A Little ?Help from another person toileting, which includes using toliet, bedpan, or urinal?: A Little ?Help from another person bathing (including washing, rinsing, drying)?: A Little ?Help from another person to put on and taking off regular upper body clothing?: A Little ?Help from another person to put on and taking off regular lower body clothing?: A Little ?6 Click Score: 18 ?  ?End of Session Equipment Utilized During Treatment: Other (comment) (quad cane) ?Nurse Communication: Mobility status ? ?Activity Tolerance: Patient tolerated treatment well ?Patient left: in bed;with call bell/phone within reach ? ?OT Visit Diagnosis: Unsteadiness on feet (R26.81);Other  abnormalities of gait and mobility (R26.89);Muscle weakness (generalized) (M62.81)  ?              ?Time: 9233-0076 ?OT Time Calculation (min): 22 min ?Charges:  OT General Charges ?$OT Visit: 1 Visit ?OT Evaluation ?$OT Eval Moderate Complexity: 1 Mod ? ?Mirl Hillery MSOT, OTR/L ?Acute Rehab ?Pager: [phone] ?Office: [phone] ? ?Bethany Hirt M Daxter Paule ?06/12/2021, 10:55 AM ?

## 2021-06-12 NOTE — Evaluation (Signed)
Physical Therapy Evaluation ?Patient Details ?Name: Henry [name] ?MRN: [id number] ?DOB: 07-11-1940 ?Today's Date: 06/12/2021 ? ?History of Present Illness ? 81 yo male presenting to ED on 4/7 with acute onset of generalized weakness and fatigue. Pt found with anemia and new onset of pancytopenia, Hgb of 6.9.  PMH including GERD, osteoarthritis and polycythemia, COVID-19, and CVA  ?Clinical Impression ? Pt presents to PT with deficits in L strength, balance, gait, functional mobility. Pt with chronic L weakness however current state is likely more significant than baseline. Pt with one large loss of balance when ambulating requiring PT assist to prevent fall, otherwise pt gait improves with increased distances. Pt with strong desire to return home as soon as possible and spouse feels confident in her ability to support him. Pt owns a wheelchair if he remains week and needs to utilize a wheelchair to reduce falls risk. PT recommends continued acute PT services in an effort to restore the patient's prior level of function.   ?   ? ?Recommendations for follow up therapy are one component of a multi-disciplinary discharge planning process, led by the attending physician.  Recommendations may be updated based on patient status, additional functional criteria and insurance authorization. ? ?Follow Up Recommendations Outpatient PT ? ?  ?Assistance Recommended at Discharge Intermittent Supervision/Assistance  ?Patient can return home with the following ? A little help with walking and/or transfers;A little help with bathing/dressing/bathroom;Assistance with cooking/housework;Direct supervision/assist for medications management;Direct supervision/assist for financial management;Assist for transportation;Help with stairs or ramp for entrance ? ?  ?Equipment Recommendations None recommended by PT  ?Recommendations for Other Services ?    ?  ?Functional Status Assessment Patient has had a recent decline in their functional status  and demonstrates the ability to make significant improvements in function in a reasonable and predictable amount of time.  ? ?  ?Precautions / Restrictions Precautions ?Precautions: Fall ?Restrictions ?Weight Bearing Restrictions: No  ? ?  ? ?Mobility ? Bed Mobility ?Overal bed mobility: Needs Assistance ?Bed Mobility: Supine to Sit, Sit to Supine ?  ?  ?Supine to sit: Min assist ?Sit to supine: Min assist ?  ?  ?  ? ?Transfers ?Overall transfer level: Needs assistance ?Equipment used: Quad cane ?Transfers: Sit to/from Stand ?Sit to Stand: Min guard ?  ?  ?  ?  ?  ?  ?  ? ?Ambulation/Gait ?Ambulation/Gait assistance: Min guard, Mod assist ?Gait Distance (Feet): 80 Feet ?Assistive device: Quad cane ?Gait Pattern/deviations: Step-to pattern ?Gait velocity: reduced ?Gait velocity interpretation: <1.31 ft/sec, indicative of household ambulator ?  ?General Gait Details: pt with slowed step-to gait, reduced L foot clearance, increased R lean to facilitate L foot clearance. One instance of loss of balance resulting in scissoring requiring modA from PT to prevent fall, otherwise gait improves with increased distance ? ?Stairs ?  ?  ?  ?  ?  ? ?Wheelchair Mobility ?  ? ?Modified Rankin (Stroke Patients Only) ?  ? ?  ? ?Balance Overall balance assessment: Needs assistance ?Sitting-balance support: No upper extremity supported, Feet supported ?Sitting balance-Leahy Scale: Good ?  ?  ?Standing balance support: Single extremity supported, Reliant on assistive device for balance ?Standing balance-Leahy Scale: Poor ?  ?  ?  ?  ?  ?  ?  ?  ?  ?  ?  ?  ?   ? ? ? ?Pertinent Vitals/Pain Pain Assessment ?Pain Assessment: No/denies pain  ? ? ?Home Living Family/patient expects to be discharged to:: Private residence ?  Living Arrangements: Spouse/significant other ?Available Help at Discharge: Family;Available 24 hours/day ?Type of Home: House ?Home Access: Ramped entrance ?  ?  ?  ?Home Layout: One level ?Home Equipment: Cane - single  point;Grab bars - tub/shower;Grab bars - toilet;Wheelchair - Regulatory affairs officer - quad ?   ?  ?Prior Function Prior Level of Function : Independent/Modified Independent ?  ?  ?  ?  ?  ?  ?Mobility Comments: utilizes quad cane for mobility ?ADLs Comments: Pt has been performing BADLs. Wife assists as needed and performs IADLs. Prior to recent weakness, was planning on going to [employer] to perform exercises ?  ? ? ?Hand Dominance  ? Dominant Hand: Right ? ?  ?Extremity/Trunk Assessment  ? Upper Extremity Assessment ?Upper Extremity Assessment: Defer to OT evaluation ?  ? ?Lower Extremity Assessment ?Lower Extremity Assessment: LLE deficits/detail ?LLE Deficits / Details: chronic L weakness since CVA. Pt with L foot drop and significant hip and knee weakness, 3-/5 grossly ?  ? ?Cervical / Trunk Assessment ?Cervical / Trunk Assessment: Kyphotic  ?Communication  ? Communication: No difficulties  ?Cognition Arousal/Alertness: Awake/alert ?Behavior During Therapy: [facility] for tasks assessed/performed, Flat affect ?Overall Cognitive Status: Within Functional Limits for tasks assessed ?  ?  ?  ?  ?  ?  ?  ?  ?  ?  ?  ?  ?  ?  ?  ?  ?  ?  ?  ? ?  ?General Comments General comments (skin integrity, edema, etc.): VSS on RA ? ?  ?Exercises    ? ?Assessment/Plan  ?  ?PT Assessment Patient needs continued PT services  ?PT Problem List Decreased strength;Decreased activity tolerance;Decreased balance;Decreased mobility;Decreased safety awareness ? ?   ?  ?PT Treatment Interventions DME instruction;Gait training;Functional mobility training;Therapeutic activities;Therapeutic exercise;Balance training;Neuromuscular re-education;Patient/family education   ? ?PT Goals (Current goals can be found in the Care Plan section)  ?Acute Rehab PT Goals ?Patient Stated Goal: to return home ?PT Goal Formulation: With patient ?Time For Goal Achievement: 06/26/21 ?Potential to Achieve Goals: Good ? ?  ?Frequency Min 3X/week ?   ? ? ?Co-evaluation   ?  ?  ?  ?  ? ? ?  ?AM-PAC PT "6 Clicks" Mobility  ?Outcome Measure Help needed turning from your back to your side while in a flat bed without using bedrails?: A Little ?Help needed moving from lying on your back to sitting on the side of a flat bed without using bedrails?: A Little ?Help needed moving to and from a bed to a chair (including a wheelchair)?: A Little ?Help needed standing up from a chair using your arms (e.g., wheelchair or bedside chair)?: A Little ?Help needed to walk in hospital room?: A Lot ?Help needed climbing 3-5 steps with a railing? : Total ?6 Click Score: 15 ? ?  ?End of Session   ?Activity Tolerance: Patient tolerated treatment well ?Patient left: in bed;with call bell/phone within reach;with bed alarm set;with family/visitor present ?Nurse Communication: Mobility status ?PT Visit Diagnosis: Other abnormalities of gait and mobility (R26.89);Other symptoms and signs involving the nervous system (R29.898);Hemiplegia and hemiparesis;Muscle weakness (generalized) (M62.81) ?Hemiplegia - Right/Left: Left ?Hemiplegia - caused by: Cerebral infarction ?  ? ?Time: 6568-1275 ?PT Time Calculation (min) (ACUTE ONLY): 28 min ? ? ?Charges:   PT Evaluation ?$PT Eval Low Complexity: 1 Low ?  ?  ?   ? ? ?Zenaida Niece, PT, DPT ?Acute Rehabilitation ?Pager: [phone] ?Office [phone] ? ? ?Zenaida Niece ?06/12/2021,  3:52 PM ? ?

## 2021-06-12 NOTE — Assessment & Plan Note (Signed)
-   We will continue statin therapy. 

## 2021-06-12 NOTE — ED Notes (Signed)
Occupational therapy at bedside working with pt.

## 2021-06-12 NOTE — Consult Note (Signed)
[place] ?CONSULT NOTE ? ?Patient Care Team: ?[name], [name], MD as PCP - General ?Gatha Mayer, MD (Gastroenterology) ?Kristeen Miss, MD as Consulting Physician (Neurosurgery) ? 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 ?Goals of care ?He needs further work-up and transfusion support while hospitalized ? ?Discharge planning ?Not ready for discharge due to ongoing investigation and the need for transfusion support ?Likely early to mid next week ? ?All questions were answered. The patient knows to call the clinic with any problems, questions or concerns. No barriers to learning was detected. ?The total time spent in the appointment was 80 minutes encounter with patients including review of  chart and various tests results, discussions about plan of care and coordination of care plan ? ?Heath Lark, MD ?06/12/2021 9:20 AM ? ?CHIEF COMPLAINTS/PURPOSE OF CONSULTATION:  ?Pancytopenia ? ?HISTORY OF PRESENTING ILLNESS:  ?Henry [name] 81 y.o. male is here because of new onset of pancytopenia ? ?He was found to have abnormal CBC from emergency room evaluation. ?I met with the patient and his wife ?He has background history of polycythemia of unknown etiology and has donated blood in the past up until his diagnosis of stroke in 2021.  He has never been evaluated by a hematologist before.  He has family history of breast cancer in his mom, diagnosed in the 44s and her sister with colon cancer diagnosed in her 49s.  The patient himself had significant smoking history in the past ?Since his stroke, he developed left-sided hemiplegia but was somewhat functional with assistance at home. 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  On 04/16/2019, his white blood cell count was 5.3, hemoglobin 13.8, MCV of 108.6 and platelet count of 145,000 ?Since then, all his blood work revealed high MCV but that was never addressed ?The patient denies excessive alcohol intake ?He eats normal diet and was not aware that  he had vitamin B12 or other mineral deficiency ?He does not take nutritional supplements except for some herbal supplements to help with his enlarged prostate and frequent urination issue ?According to his wife, he is up-to-date with screening program including annual PSA  monitoring by his primary care doctor ?Starting end of October, he has developed progressive pancytopenia ?On December 26, 2020, white blood cell count 6.8, hemoglobin 12, MCV 115.7 and platelet count 120,000 ?When he had surgery in January, his white blood cell count was 5.1, hemoglobin of 11, MCV 11.7 and platelet count 110 ?Yesterday, his CBC showed white blood cell count of 3.6, hemoglobin 6.9, MCV 117 and platelet count of 43,000 ? ?He denies recent bruising/bleeding, such as spontaneous epistaxis, hematuria, melena or hematochezia ?He denies prior blood or platelet transfusions ?He denies recent infection. ? ?MEDICAL HISTORY:  ?Past Medical History:  ?Diagnosis Date  ? Colon polyps   ? COVID-19   ? Elevated MCV 2011  ? GERD (gastroesophageal reflux disease) 2012  ? LBP (low back pain)   ? 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 Osteoarthritis   ? Pneumonia   ? Polycythemia 2011  ? Stroke [facility])   ? ? ?SURGICAL HISTORY: ?Past Surgical History:  ?Procedure Laterality Date  ? APPENDECTOMY    ? COLONOSCOPY    ? HERNIA REPAIR    ? march 2019  ? INGUINAL HERNIA REPAIR Left 2015  ? IR CT HEAD LTD  04/17/2019  ? IR PERCUTANEOUS ART THROMBECTOMY/INFUSION INTRACRANIAL INC DIAG ANGIO  04/17/2019  ? LUMBAR LAMINECTOMY/DECOMPRESSION MICRODISCECTOMY N/A 05/20/2016  ? Procedure: Lumbar Three-Four,Lunmbar Four-Five,Lumbar Five-Sacral One Laminectomy/Foraminotomy;  Surgeon: Kristeen Miss, MD;  Location: [place];  Service: Neurosurgery;  Laterality: N/A;  ? LUMBAR LAMINECTOMY/DECOMPRESSION MICRODISCECTOMY Left 03/22/2021  ? Procedure: Left Lumbar three-four Laminectomy and foraminotomy;  Surgeon: Kristeen Miss, MD;  Location: [place];  Service: Neurosurgery;  Laterality: Left;  ? POLYPECTOMY    ? RADIOLOGY WITH ANESTHESIA N/A 04/17/2019  ? Procedure: IR WITH ANESTHESIA;  Surgeon: Luanne Bras, MD;  Location: [place];  Service: Radiology;  Laterality: N/A;  ? ? ?SOCIAL HISTORY: ?Social History  ? ?Socioeconomic  History  ? Marital status: Married  ?  Spouse name: [name]  ? Number of children: 3  ? Years of education: 10  ? Highest education level: Not on file  ?Occupational History  ? Occupation: Retired  ?  Employer: RETIRED  ?Tobacco Use  ? Smoking status: Former  ?  Packs/day: 1.00  ?  Types: Cigarettes  ?  Quit date: 05/24/1976  ?  Years since quitting: 45.0  ? Smokeless tobacco: Never  ?Vaping Use  ? Vaping Use: Never used  ?Substance and Sexual Activity  ? Alcohol use: No  ? Drug use: No  ? Sexual activity: Not Currently  ?Other Topics Concern  ? Not on file  ?Social History Narrative  ? Not on file  ? ?Social Determinants of Health  ? ?Financial Resource Strain: Not on file  ?Food Insecurity: Not on file  ?Transportation Needs: Not on file  ?Physical Activity: Not on file  ?Stress: Not on file  ?Social Connections: Not on file  ?Intimate Partner Violence: Not on file  ? ? ?FAMILY HISTORY: ?Family History  ?Problem Relation Age of Onset  ? Colon cancer Sister 77  ? Breast cancer Other   ?     1st degree relative  ? Stroke Neg Hx   ? Esophageal cancer Neg Hx   ? Pancreatic  cancer Neg Hx   ? Prostate cancer Neg Hx   ? Rectal cancer Neg Hx   ? Stomach cancer Neg Hx   ? ? ?ALLERGIES:  has No Known Allergies. ? ?MEDICATIONS:  ?Current Facility-Administered Medications  ?Medication Dose Route Frequency Provider Last Rate Last Admin  ? 0.9 %  sodium chloride infusion (Manually program via Guardrails IV Fluids)   Intravenous Once Thurnell Lose, MD      ? 0.9 %  sodium chloride infusion   Intravenous Continuous Thurnell Lose, MD      ? acetaminophen (TYLENOL) tablet 650 mg  650 mg Oral Q6H PRN Mansy, Jan A, MD      ? Or  ? acetaminophen (TYLENOL) suppository 650 mg  650 mg Rectal Q6H PRN Mansy, Jan A, MD      ? atorvastatin (LIPITOR) tablet 20 mg  20 mg Oral QODAY Mansy, Jan A, MD      ? B-complex with vitamin C tablet 1 tablet  1 tablet Oral Daily Mansy, Jan A, MD      ? cholecalciferol (VITAMIN D3) tablet 5,000 Units   5,000 Units Oral Daily Mansy, Jan A, MD      ? donepezil (ARICEPT) tablet 10 mg  10 mg Oral QHS Mansy, Jan A, MD   10 mg at 06/11/21 2238  ? famotidine (PEPCID) tablet 40 mg  40 mg Oral Daily Candiss Norse, Prashant K,

## 2021-06-12 NOTE — Progress Notes (Signed)
?                                  PROGRESS NOTE                                             ?                                                                                                                     ?                                         ? ? Patient Demographics:  ? ? Henry [name], is a 81 y.o. male, DOB - Apr 25, 1940, ION:[id number] ? ?Outpatient Primary MD for the patient is [name], [name], MD    LOS - 0  Admit date - 06/11/2021   ? ?Chief Complaint  ?Patient presents with  ? Anemia  ?    ? ?Brief Narrative (HPI from H&P)    81 y.o. male with medical history significant for GERD, osteoarthritis and polycythemia as well as CVA, who presented to the ER with acute onset of generalized weakness and fatigue with no energy.  Found to have pancytopenia with severe symptomatic anemia with hemoglobin of 6 and admitted to the hospital.  No frank bleeding or melena. ? ? Subjective:  ? ? Henry [name] today has, No headache, No chest pain, No abdominal pain - No Nausea, No new weakness tingling or numbness, no shortness of breath but diffuse generalized weakness and fatigue. ? ? Assessment  & Plan :  ? ? ?Symptomatic anemia in a patient with pancytopenia - he does have previous history of polycythemia, no recent infection or medication changes, no bleeding or melena, he has received 1 unit of packed RBC already and will be getting a second unit on 06/12/2021, initial smear review by technician is unremarkable however his LDH is somewhat elevated and has previous history of polycythemia hence will involve hematology as well, monitor counts closely, will check a respiratory viral panel although no symptoms to rule out parvovirus. ? ?Dyslipidemia - We will continue statin therapy. ? ?BPH - We will continue Flomax. ? ?Dementia with behavioral disturbance ([place])  - We will continue Aricept and Namenda. ? ?Severe generalized weakness and deconditioning.  Due to #1 above.  Address as in  #1 above, PT OT.  May require placement. ? ?Dyslipidemia.  Started on statin. ? 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  Almost resolved weakness, dual antiplatelet therapy held due to severe pancytopenia, statin continued for secondary prevention.  PT OT and supportive care. ? ?Mild underlying dementia at risk for delirium/encephalopathy.  Supportive care.  Continue memantine  and Seroquel combination. ? ?   ? ?Condition - Guarded ? ?Family Communication  :  wife bedside 06/12/21 ? ?Code Status :  Full ? ?Consults  :  Onco ? ?PUD Prophylaxis : Pepcid ? ? Procedures  :    ? ?  ? ?   ? ?Disposition Plan  :   ? ?Status is: Observation ? ?DVT Prophylaxis  :   ? ?SCDs Start: 06/11/21 2135 ?  ? ?Lab Results  ?Component Value Date  ? PLT 31 (L) 06/12/2021  ? ? ?Diet :  ?Diet Order   ? ?       ?  DIET SOFT Room service appropriate? Yes; Fluid consistency: Nectar Thick  Diet effective now       ?  ? ?  ?  ? ?  ?  ? ?Inpatient Medications ? ?Scheduled Meds: ? sodium chloride   Intravenous Once  ? atorvastatin  20 mg Oral QODAY  ? B-complex with vitamin C  1 tablet Oral Daily  ? cholecalciferol  5,000 Units Oral Daily  ? donepezil  10 mg Oral QHS  ? famotidine  40 mg Oral Daily  ? memantine  10 mg Oral BID  ? QUEtiapine  12.5 mg Oral QHS  ? ?Continuous Infusions: ? sodium chloride    ? ?PRN Meds:.acetaminophen **OR** acetaminophen, magnesium hydroxide, methocarbamol, ondansetron **OR** ondansetron (ZOFRAN) IV, traZODone ? ?Antibiotics  :   ? ?Anti-infectives (From admission, onward)  ? ? None  ? ?  ? ? ? Time Spent in minutes  30 ? ? ?Lala Lund M.D on 06/12/2021 at 8:36 AM ? ?To page go to [email or web address]  ? ?[facility] -  Office  [phone] ? ?See all Orders from today for further details ? ? ? Objective:  ? ?Vitals:  ? 06/12/21 0500 06/12/21 0600 06/12/21 0700 06/12/21 0800  ?BP: 99/60 (!) 90/54 (!) 93/58 100/61  ?Pulse: 75 62 67 67  ?Resp: (!) '24 19 18 '$ (!) 24  ?Temp: 98.3 ?F (36.8  ?C)     ?TempSrc:      ?SpO2: 96% 95% 96% 97%  ? ? ?Wt Readings from Last 3 Encounters:  ?05/20/21 66 kg  ?05/18/21 66.2 kg  ?04/07/21 68.4 kg  ? ? ?No intake or output data in the 24 hours ending 06/12/21 0836 ? ? ?Physical Exam ? ?Awake Alert, No new F.N deficits, Normal affect ?[place].AT,PERRAL ?Supple Neck, No JVD,   ?Symmetrical Chest wall movement, Good air movement bilaterally, CTAB ?RRR,No Gallops,Rubs or new Murmurs,  ?+ve B.Sounds, Abd Soft, No tenderness,   ?No Cyanosis, Clubbing or edema  ?   ? ? Data Review:  ? ? ?CBC ?Recent Labs  ?Lab 06/11/21 ?1927 06/12/21 ?0243  ?WBC 3.6* 3.5*  ?HGB 6.9* 7.3*  ?HCT 20.6* 22.1*  ?PLT 43* 31*  ?MCV 117.0* 106.8*  ?MCH 39.2* 35.3*  ?MCHC 33.5 33.0  ?RDW 15.5 Not Measured  ?LYMPHSABS 1.8  --   ?MONOABS 0.4  --   ?EOSABS 0.1  --   ?BASOSABS 0.0  --   ? ? ?Electrolytes ?Recent Labs  ?Lab 06/11/21 ?1919 06/12/21 ?0243  ?NA 139 141  ?K 4.1 3.8  ?CL 105 107  ?CO2 22 22  ?GLUCOSE 94 76  ?BUN 29* 24*  ?CREATININE 1.00 0.89  ?CALCIUM 10.1 9.4  ?AST 38  --   ?ALT 24  --   ?ALKPHOS 122  --   ?BILITOT 0.7  --   ?ALBUMIN 3.1*  --   ? ? ?------------------------------------------------------------------------------------------------------------------ ?  No results for input(s): CHOL, HDL, LDLCALC, TRIG, CHOLHDL, LDLDIRECT in the last 72 hours. ? ?Lab Results  ?Component Value Date  ? HGBA1C 6.6 (H) 05/29/2019  ? ? ?No results for input(s): TSH, T4TOTAL, T3FREE, THYROIDAB in the last 72 hours. ? ?Invalid input(s): FREET3 ?------------------------------------------------------------------------------------------------------------------ ?ID Labs ?Recent Labs  ?Lab 06/11/21 ?1919 06/11/21 ?1927 06/12/21 ?0243  ?WBC  --  3.6* 3.5*  ?PLT  --  43* 31*  ?CREATININE 1.00  --  0.89  ? ?Cardiac Enzymes ?No results for input(s): CKMB, TROPONINI, MYOGLOBIN in the last 168 hours. ? ?Invalid input(s): CK ? ? ?Micro Results ?No results found for this or any previous visit (from the past 240  hour(s)). ? ?Radiology Reports ?No results found.  ? ? ?

## 2021-06-12 NOTE — ED Notes (Signed)
RN unable to locate previous blood consent, filled out new consent form at this time for pt's newly ordered unit of blood. ?

## 2021-06-12 NOTE — ED Notes (Signed)
Pt care taken, no complaints at this time. 

## 2021-06-12 NOTE — Assessment & Plan Note (Addendum)
-   The patient will be admitted to a medical telemetry observation bed. ?- He was typed and crossmatched will be transfused 2 units of packed red blood cells. ?- Hematology consultation will be obtained for further assessment of his associated thrombocytopenia and mild leukopenia. ?- Anemia work-up will be obtained. ?- Aspirin will be held off given his thrombocytopenia and will hold off NSAIDs at this time. ?

## 2021-06-12 NOTE — Assessment & Plan Note (Signed)
-   We will continue Flomax 

## 2021-06-12 NOTE — Assessment & Plan Note (Signed)
-   We will continue Aricept and Namenda. 

## 2021-06-13 ENCOUNTER — Encounter (HOSPITAL_COMMUNITY): Payer: Self-pay | Admitting: Family Medicine

## 2021-06-13 DIAGNOSIS — D61818 Other pancytopenia: Secondary | ICD-10-CM | POA: Diagnosis not present

## 2021-06-13 LAB — COMPREHENSIVE METABOLIC PANEL
ALT: 21 U/L (ref 0–44)
AST: 35 U/L (ref 15–41)
Albumin: 2.6 g/dL — ABNORMAL LOW (ref 3.5–5.0)
Alkaline Phosphatase: 113 U/L (ref 38–126)
Anion gap: 12 (ref 5–15)
BUN: 18 mg/dL (ref 8–23)
CO2: 21 mmol/L — ABNORMAL LOW (ref 22–32)
Calcium: 9.5 mg/dL (ref 8.9–10.3)
Chloride: 107 mmol/L (ref 98–111)
Creatinine, Ser: 0.97 mg/dL (ref 0.61–1.24)
GFR, Estimated: >60 mL/min (ref >60)
Glucose, Bld: 89 mg/dL (ref 70–99)
Potassium: 3.6 mmol/L (ref 3.5–5.1)
Sodium: 140 mmol/L (ref 135–145)
Total Bilirubin: 0.8 mg/dL (ref 0.3–1.2)
Total Protein: 5.5 g/dL — ABNORMAL LOW (ref 6.5–8.1)

## 2021-06-13 LAB — CBC WITH DIFFERENTIAL/PLATELET
Abs Immature Granulocytes: 0.14 10*3/uL — ABNORMAL HIGH (ref 0.00–0.07)
Basophils Absolute: 0 10*3/uL (ref 0.0–0.1)
Basophils Relative: 0 %
Eosinophils Absolute: 0 10*3/uL (ref 0.0–0.5)
Eosinophils Relative: 2 %
HCT: 26.4 % — ABNORMAL LOW (ref 39.0–52.0)
Hemoglobin: 8.8 g/dL — ABNORMAL LOW (ref 13.0–17.0)
Immature Granulocytes: 5 %
Lymphocytes Relative: 40 %
Lymphs Abs: 1.1 10*3/uL (ref 0.7–4.0)
MCH: 33.3 pg (ref 26.0–34.0)
MCHC: 33.3 g/dL (ref 30.0–36.0)
MCV: 100 fL (ref 80.0–100.0)
Monocytes Absolute: 0.3 10*3/uL (ref 0.1–1.0)
Monocytes Relative: 9 %
Neutro Abs: 1.2 10*3/uL — ABNORMAL LOW (ref 1.7–7.7)
Neutrophils Relative %: 44 %
Platelets: 27 10*3/uL — CL (ref 150–400)
RBC: 2.64 MIL/uL — ABNORMAL LOW (ref 4.22–5.81)
RDW: 22.6 % — ABNORMAL HIGH (ref 11.5–15.5)
Smear Review: DECREASED
WBC: 2.7 10*3/uL — ABNORMAL LOW (ref 4.0–10.5)
nRBC: 1.1 % — ABNORMAL HIGH (ref 0.0–0.2)

## 2021-06-13 LAB — BPAM RBC
Blood Product Expiration Date: 202305052359
Blood Product Expiration Date: 202305072359
ISSUE DATE / TIME: 202304072136
ISSUE DATE / TIME: 202304080846
Unit Type and Rh: 5100
Unit Type and Rh: 5100

## 2021-06-13 LAB — TYPE AND SCREEN
ABO/RH(D): O POS
Antibody Screen: NEGATIVE
Unit division: 0
Unit division: 0

## 2021-06-13 LAB — BRAIN NATRIURETIC PEPTIDE: B Natriuretic Peptide: 318.5 pg/mL — ABNORMAL HIGH (ref 0.0–100.0)

## 2021-06-13 LAB — PSA: Prostatic Specific Antigen: 0.69 ng/mL (ref 0.00–4.00)

## 2021-06-13 LAB — MAGNESIUM: Magnesium: 2 mg/dL (ref 1.7–2.4)

## 2021-06-13 NOTE — Plan of Care (Signed)

## 2021-06-13 NOTE — Progress Notes (Signed)
HOSPITAL MEDICINE OVERNIGHT EVENT NOTE   ? ?Notified by nursing that patient has a platelet count of 27 this morning.  This is only marginally changed from 69 yesterday.  Patient is not exhibiting any gross bleeding or heavy bruising. ? ?Thrombocytopenia is already being tracked and worked up by day team.  Patient has already been seen by hematology with concern for myelodysplastic syndrome, metastatic disease to the bone or bone marrow failure, work-up ongoing. ? ?Continue to monitor unless patient exhibits signs of bleeding or platelet count of less than 10,000. ? ?Vernelle Emerald  MD ?[facility]  ? ? ? ? ? ? ? ? ? ? ?

## 2021-06-13 NOTE — Consult Note (Addendum)
? ?Chief Complaint: ?Patient was seen in consultation today for image guided BMBx ?Chief Complaint  ?Patient presents with  ? Anemia  ? at the request of Alvy Bimler, Ni  ? ?Referring Physician(s): Alvy Bimler, Ni  ? ?Supervising Physician: Markus Daft ? ?Patient Status: [facility] - In-pt ? ?History of Present Illness: ?Henry [name] is a 81 y.o. male with PMHs of  GERD, osteoarthritis and polycythemia as well as CVA, who presented to the ER on 06/11/21 with acute onset of generalized weakness and fatigue with no energy. ? ?Patient was found to have acute symptomatic anemia with hgb of 6.9, received 2 PRBC and hospitalized for further eval and management. Hem/onc was consulted for evaluation of the pancytopenia in a patient with history of idiopathic polycythemia. A bone marrow biopsy was recommended to the patient for further evaluation, after thorough discussion and shared decision making, patient decided to proceed with the biopsy.  ? ?IR was requested for BMBx.  ? ?Patient laying in bed, not in acute distress. Wife at bedside.  ?Denise headache, fever, chills, shortness of breath, cough, chest pain, abdominal pain, nausea ,vomiting, and bleeding. ?States that he used to have too many blood cells, and his doctor told him that he has to give blood periodically to treat it.  ?After discussing the bone marrow biopsy and possibility that it may not be done on Monday, patient states that he is ready to go home and he would rather get the biopsy done as outpatient.  ?Informed the patient that biopsy can be done as outpatient, but it might take long time to get it scheduled. Informed the patient that IR will try our best to get the biopsy done tomorrow, but it may not be done till Tuesday.  ?Patient verbalized understanding.  ? ? ? ?Past Medical History:  ?Diagnosis Date  ? Colon polyps   ? COVID-19   ? Elevated MCV 2011  ? GERD (gastroesophageal reflux disease) 2012  ? LBP (low back pain)   ? 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 Osteoarthritis   ? Pneumonia   ? Polycythemia 2011  ? Stroke [facility])   ? ? ?Past Surgical History:  ?Procedure Laterality Date  ? APPENDECTOMY    ? COLONOSCOPY    ? HERNIA REPAIR    ? march 2019  ? INGUINAL HERNIA REPAIR Left 2015  ? IR CT HEAD LTD  04/17/2019  ? IR PERCUTANEOUS ART THROMBECTOMY/INFUSION INTRACRANIAL INC DIAG ANGIO  04/17/2019  ? LUMBAR LAMINECTOMY/DECOMPRESSION MICRODISCECTOMY N/A 05/20/2016  ? Procedure: Lumbar Three-Four,[place]mb[place] Four-Five,Lumbar Five-Sacral One Laminectomy/Foraminotomy;  Surgeon: Kristeen Miss, MD;  Location: [place];  Service: Neurosurgery;  Laterality: N/A;  ? LUMBAR LAMINECTOMY/DECOMPRESSION MICRODISCECTOMY Left 03/22/2021  ? Procedure: Left Lumbar three-four Laminectomy and foraminotomy;  Surgeon: Kristeen Miss, MD;  Location: [place];  Service: Neurosurgery;  Laterality: Left;  ? POLYPECTOMY    ? RADIOLOGY WITH ANESTHESIA N/A 04/17/2019  ? Procedure: IR WITH ANESTHESIA;  Surgeon: Luanne Bras, MD;  Location: [place];  Service: Radiology;  Laterality: N/A;  ? ? ?Allergies: ?Patient has no known allergies. ? ?Medications: ?Prior to Admission medications   ?Medication Sig Start Date End Date Taking? Authorizing Provider  ?acetaminophen (TYLENOL) 325 MG tablet Take 650 mg by mouth every 6 (six) hours as needed for moderate pain.   Yes [provider]  ?aspirin EC 81 MG tablet Take 1 tablet (81 mg total) by mouth daily. Swallow whole. 01/06/20  Yes Frann Rider, NP  ?atorvastatin (LIPITOR)  40 MG tablet TAKE ONE (1) TABLET BY MOUTH EVERY DAY AT 6PM. ?Patient taking differently: Take 20 mg by mouth every other day. 11/16/20  Yes [name], [name], MD  ?B Complex-Folic Acid (B COMPLEX PLUS) TABS Take 1 tablet by mouth daily. 08/08/19  Yes [name], [name], MD  ?Cholecalciferol (VITAMIN D3) 50 MCG (2000 UT) capsule Take 1 capsule (2,000 Units total) by mouth daily. 05/29/19  Yes [name], [name], MD  ?donepezil (ARICEPT) 10 MG tablet Take 1  tablet (10 mg total) by mouth at bedtime. 01/07/21  Yes Frann Rider, NP  ?megestrol (MEGACE) 40 MG tablet Take 1 tablet (40 mg total) by mouth 2 (two) times daily. 05/20/21  Yes [name], [name], MD  ?memantine (NAMENDA) 10 MG tablet TAKE 1 TABLET BY MOUTH TWICE DAILY. 05/31/21  Yes [name], [name], MD  ?Misc Natural Products (PROSTATE HEALTH) CAPS Take 1 capsule by mouth in the morning and at bedtime.   Yes [provider]  ?QUEtiapine (SEROQUEL) 25 MG tablet Take 0.5 tablets (12.5 mg total) by mouth at bedtime. ?Patient taking differently: Take 25 mg by mouth at bedtime. 05/20/21  Yes [name], [name], MD  ?tamsulosin (FLOMAX) 0.4 MG CAPS capsule Take 0.4 mg by mouth daily. 05/24/21  Yes [provider]  ?celecoxib (CELEBREX) 200 MG capsule One po qd or BID prn pain ?Patient not taking: Reported on 06/12/2021 01/11/21   [name], [name], MD  ?methocarbamol (ROBAXIN) 500 MG tablet Take 1 tablet (500 mg total) by mouth every 6 (six) hours as needed for muscle spasms. ?Patient not taking: Reported on 06/12/2021 03/23/21   Kristeen Miss, MD  ?  ? ?Family History  ?Problem Relation Age of Onset  ? Colon cancer Sister 85  ? Breast cancer Other   ?     1st degree relative  ? Stroke Neg Hx   ? Esophageal cancer Neg Hx   ? Pancreatic cancer Neg Hx   ? Prostate cancer Neg Hx   ? Rectal cancer Neg Hx   ? Stomach cancer Neg Hx   ? ? ?Social History  ? ?Socioeconomic History  ? Marital status: Married  ?  Spouse name: [name]  ? Number of children: 3  ? Years of education: 13  ? Highest education level: Not on file  ?Occupational History  ? Occupation: Retired  ?  Employer: RETIRED  ?Tobacco Use  ? Smoking status: Former  ?  Packs/day: 1.00  ?  Types: Cigarettes  ?  Quit date: 05/24/1976  ?  Years since quitting: 45.0  ? Smokeless tobacco: Never  ?Vaping Use  ? Vaping Use: Never used  ?Substance and Sexual Activity  ? Alcohol use: No  ? Drug use: No  ? Sexual activity: Not Currently  ?Other Topics  Concern  ? Not on file  ?Social History Narrative  ? Not on file  ? ?Social Determinants of Health  ? ?Financial Resource Strain: Not on file  ?Food Insecurity: Not on file  ?Transportation Needs: Not on file  ?Physical Activity: Not on file  ?Stress: Not on file  ?Social Connections: Not on file  ? ? ? ?Review of Systems: A 12 point ROS discussed and pertinent positives are indicated in the HPI above.  All other systems are negative. ? ?Vital Signs: ?BP 101/62 (BP Location: Right Arm)   Pulse 88   Temp 98.6 ?F (37 ?C) (Oral)   Resp 19   SpO2 95%  ? ? ?Physical Exam ?Vitals reviewed.  ?  Constitutional:   ?   General: He is not in acute distress. ?   Appearance: Normal appearance. He is not ill-appearing.  ?HENT:  ?   Head: Normocephalic and atraumatic.  ?   Mouth/Throat:  ?   Mouth: Mucous membranes are moist.  ?   Pharynx: Oropharynx is clear.  ?Cardiovascular:  ?   Rate and Rhythm: Normal rate and regular rhythm.  ?   Heart sounds: Normal heart sounds.  ?Pulmonary:  ?   Effort: Pulmonary effort is normal.  ?   Breath sounds: Normal breath sounds.  ?Abdominal:  ?   General: Abdomen is flat. Bowel sounds are normal.  ?   Palpations: Abdomen is soft.  ?Musculoskeletal:  ?   Cervical back: Neck supple.  ?Skin: ?   General: Skin is warm and dry.  ?   Coloration: Skin is not jaundiced or pale.  ?Neurological:  ?   Mental Status: He is alert and oriented to person, place, and time.  ?Psychiatric:     ?   Mood and Affect: Mood normal.     ?   Behavior: Behavior normal.     ?   Judgment: Judgment normal.  ? ? ?MD Evaluation ?Airway: WNL ?Heart: WNL ?Abdomen: WNL ?Chest/ Lungs: WNL ?ASA  Classification: 3 ?Mallampati/Airway Score: Two ? ?Imaging: ?CT CHEST ABDOMEN PELVIS W CONTRAST ? ?Result Date: 06/12/2021 ?CLINICAL DATA:  Unintended weight loss and idiopathic polycythemia with pancytopenia. Evaluate for metastatic disease. * Tracking Code: BO * EXAM: CT CHEST, ABDOMEN, AND PELVIS WITH CONTRAST TECHNIQUE: Multidetector  CT imaging of the chest, abdomen and pelvis was performed following the standard protocol during bolus administration of intravenous contrast. RADIATION DOSE REDUCTION: This exam was performed according to the depar

## 2021-06-13 NOTE — Progress Notes (Signed)
Henry [name]   DOB:[birth year]   GT#:[id number]   ? ?ASSESSMENT & PLAN:  ?Progressive pancytopenia ?Looking back on his blood work, he had abnormal CBC since 2022 with macrocytosis and that has not been addressed by any physicians that I am aware of ?Given his history of idiopathic polycythemia, and now with pancytopenia, I am concerned that he may have myelofibrosis or myelodysplastic syndrome, given his age ?I recommend he continues to receive blood transfusion as necessary to keep his hemoglobin above 8 ?He does not need platelet transfusion in the absence of bleeding, unless his platelet count dropped to less than 10,000 ?CT imaging of the chest, abdomen and pelvis with contrast showed no evidence of cancer to explain his pancytopenia ?I have cancelled the order for bone scan ?I have reviewed test results with him and his wife; overall, 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  Differential diagnosis could be early myelodysplastic syndrome, myelofibrosis or AML ?We discussed the risk, benefits, side effects of bone marrow biopsy and he wants to have it performed ?I will order the bone marrow biopsy today, he is aware that this will not be done over the weekend ?Once the biopsy is performed, he can be discharged ?He is aware that it takes 5 to 7 days for results to become available ?I will schedule outpatient follow-up as soon as I am made aware of the date of his procedure ?  ?Goals of care ?He needs further work-up and transfusion support while hospitalized ?  ?Discharge planning ?Not ready for discharge due to ongoing investigation and the need for transfusion support ?I am hopeful he can be discharged early next week ?All questions were answered. The patient knows to call the clinic with any problems, questions or concerns. ?  ?The total time spent in the appointment was 30 minutes encounter with patients including review of chart and various tests results, discussions  about plan of care and coordination of care plan ? ?Heath Lark, MD ?06/13/2021 10:39 AM ? ?Subjective:  ?I returned to review test results with him and his wife.  His hemoglobin has improved with transfusion.  He has no signs of bleeding ? ?Objective:  ?Vitals:  ? 06/13/21 0500 06/13/21 0754  ?BP: 107/62 110/69  ?Pulse:  82  ?Resp:  20  ?Temp: 97.8 ?F (36.6 ?C) 97.7 ?F (36.5 ?C)  ?SpO2:  94%  ?  ? ?Intake/Output Summary (Last 24 hours) at 06/13/2021 1039 ?Last data filed at 06/12/2021 2055 ?Gross per 24 hour  ?Intake 352.08 ml  ?Output 100 ml  ?Net 252.08 ml  ? ? ?GENERAL:alert, no distress and comfortable ?NEURO: alert & oriented x 3 with fluent speech ?  ?Labs:  ?Recent Labs  ?  12/31/20 ?1040 06/11/21 ?1919 06/12/21 ?0243 06/13/21 ?0244  ?NA 140 139 141 140  ?K 3.9 4.1 3.8 3.6  ?CL 103 105 107 107  ?CO2 '31 22 22 ' 21*  ?GLUCOSE 115* 94 76 89  ?BUN 21 29* 24* 18  ?CREATININE 1.16 1.00 0.89 0.97  ?CALCIUM 9.4 10.1 9.4 9.5  ?GFRNONAA  --  >60 >60 >60  ?PROT 7.2 6.3*  --  5.5*  ?ALBUMIN 4.0 3.1*  --  2.6*  ?AST 55* 38  --  35  ?ALT 21 24  --  21  ?ALKPHOS 75 122  --  113  ?BILITOT 0.7 0.7  --  0.8  ? ? ?Studies: I have personally review results of his CT imaging ?CT CHEST  ABDOMEN PELVIS W CONTRAST ? ?Result Date: 06/12/2021 ?CLINICAL DATA:  Unintended weight loss and idiopathic polycythemia with pancytopenia. Evaluate for metastatic disease. * Tracking Code: BO * EXAM: CT CHEST, ABDOMEN, AND PELVIS WITH CONTRAST TECHNIQUE: Multidetector CT imaging of the chest, abdomen and pelvis was performed following the standard protocol during bolus administration of intravenous contrast. RADIATION DOSE REDUCTION: This exam was performed according to the departmental dose-optimization program which includes automated exposure control, adjustment of the mA and/or kV according to patient size and/or use of iterative reconstruction technique. CONTRAST:  161m OMNIPAQUE IOHEXOL 300 MG/ML  SOLN COMPARISON:  Chest CT 12/10/2019. FINDINGS: CT  CHEST FINDINGS Cardiovascular: The heart size is normal. No substantial pericardial effusion. Coronary artery calcification is evident. Mild atherosclerotic calcification is noted in the wall of the thoracic aorta. Mediastinum/Nodes: No mediastinal lymphadenopathy. There is no hilar lymphadenopathy. The esophagus has normal imaging features. There is no axillary lymphadenopathy. Lungs/Pleura: Centrilobular and paraseptal emphysema evident. No suspicious pulmonary nodule or mass. No focal airspace consolidation. No pleural effusion. Musculoskeletal: No worrisome lytic or sclerotic osseous abnormality. Nonacute left-sided rib fractures evident. CT ABDOMEN PELVIS FINDINGS Hepatobiliary: No suspicious focal abnormality within the liver parenchyma. There is no evidence for gallstones, gallbladder wall thickening, or pericholecystic fluid. No intrahepatic or extrahepatic biliary dilation. Pancreas: No focal mass lesion. No dilatation of the main duct. No intraparenchymal cyst. No peripancreatic edema. Spleen: No splenomegaly. No focal mass lesion. Adrenals/Urinary Tract: No adrenal nodule or mass. Right kidney unremarkable. 2.3 cm water density lesion upper pole left kidney is compatible with a simple cyst. No followup recommended. No evidence for hydroureter. The urinary bladder appears normal for the degree of distention. Stomach/Bowel: Stomach is moderately distended with food and fluid. Duodenum is normally positioned as is the ligament of Treitz. No small bowel wall thickening. No small bowel dilatation. The terminal ileum is normal. The appendix is not well visualized, but there is no edema or inflammation in the region of the cecum. No gross colonic mass. No colonic wall thickening. There is some subtle edema/inflammation in the sigmoid mesocolon (axial image 100/series 4 and coronal image 61/6). Ill-defined diverticulum visible on image 97/4 and coronal 53/6. Vascular/Lymphatic: There is mild atherosclerotic  calcification of the abdominal aorta without aneurysm. There is no gastrohepatic or hepatoduodenal ligament lymphadenopathy. No retroperitoneal or mesenteric lymphadenopathy. No pelvic sidewall lymphadenopathy. Reproductive: Prostate gland is enlarged with evidence of median lobe hypertrophy generating mass-effect on the inferior bladder Other: No intraperitoneal free fluid. Musculoskeletal: No worrisome lytic or sclerotic osseous abnormality. Probable chronic insufficiency fracture left sacrum. Chronic L2 compression deformity evident. IMPRESSION: 1. No evidence for primary malignancy or metastatic disease in the chest, abdomen, or pelvis. 2. Subtle edema/inflammation in the sigmoid mesocolon in the region of an ill-defined diverticulum. Imaging features consistent with sigmoid diverticulitis. No perforation or abscess. 3. Prostatomegaly with evidence of median lobe hypertrophy generating mass-effect on the inferior bladder. 4. Nonacute left-sided rib fractures. 5. 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 6. Aortic Atherosclerosis (ICD10-I70.0) and Emphysema (ICD10-J43.9). Electronically Signed   By: EMisty StanleyM.D.   On: 06/12/2021 12:36  ? ?DG Chest Port 1 View ? ?Result Date: 06/12/2021 ?CLINICAL DATA:  Anemia and shortness of breath EXAM: PORTABLE CHEST 1 VIEW COMPARISON:  Chest CT 12/10/2019 FINDINGS: Chronic patchy reticulation in the bilateral lungs correlating with chronic lung disease and mild fibrosis on 2021 chest CT. Generous heart size accentuated by technique. Extensive artifact from EKG leads. IMPRESSION: Chronic lung disease/pulmonary fibrosis without  definite progression from 2021. No acute superimposed finding. Electronically Signed   By: Jorje Guild M.D.   On: 06/12/2021 09:03   ? ?

## 2021-06-13 NOTE — Progress Notes (Signed)
The patient is injury-free, afebrile, alert, and oriented X 3. Vital signs were within the baseline during this shift. Platelets are 27 with no s/s, MD was notified. Pt denies chest pain, SOB, nausea, vomiting, dizziness, signs or symptoms of bleeding or infection, or acute changes during this shift. We will continue to monitor and work toward achieving the care plan goal ?

## 2021-06-13 NOTE — Progress Notes (Addendum)
?                                  PROGRESS NOTE                                             ?                                                                                                                     ?                                         ? ? Patient Demographics:  ? ? Henry [name], is a 81 y.o. male, DOB - 07/05/1940, [id number] ? ?Outpatient Primary MD for the patient is [name], [name], MD    LOS - 1  Admit date - 06/11/2021   ? ?Chief Complaint  ?Patient presents with  ? Anemia  ?    ? ?Brief Narrative (HPI from H&P)    81 y.o. male with medical history significant for GERD, osteoarthritis and polycythemia as well as CVA, who presented to the ER with acute onset of generalized weakness and fatigue with no energy.  Found to have pancytopenia with severe symptomatic anemia with hemoglobin of 6 and admitted to the hospital.  No frank bleeding or melena. ? ? Subjective:  ? ?Patient in bed, appears comfortable, denies any headache, no fever, no chest pain or pressure, no shortness of breath , no abdominal pain. No new focal weakness. ? ? ? Assessment  & Plan :  ? ? ?Symptomatic anemia in a patient with pancytopenia - he does have previous history of polycythemia, no recent infection or medication changes, no bleeding or melena, he has received 1 unit of packed RBC already and will be getting a second unit on 06/12/2021, initial smear review by technician is unremarkable however his LDH is somewhat elevated and has previous history of polycythemia as well, seen by oncology, CT chest abdomen pelvis nonacute except for mildly enlarged prostate and some old asymptomatic L2 and left sacrum fracture.  PSA levels relatively stable.  Will proceed with whole-body bone scan and most likely will require bone marrow biopsy as well. ? ?Dyslipidemia - We will continue statin therapy. ? ?BPH - We will continue Flomax.  Outpatient urology follow-up. ? ?Dementia with behavioral  disturbance ([place])  - We will continue Aricept and Namenda. ? ?Severe generalized weakness and deconditioning.  Due to #1 above.  Address as in #1 above, PT OT.  Will require home health PT. ? ?Dyslipidemia.  Started on statin. ? 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  Almost resolved weakness, dual antiplatelet therapy held due to severe  pancytopenia, statin continued for secondary prevention.  PT OT and supportive care. ? ?Mild underlying dementia at risk for delirium/encephalopathy.  Supportive care.  Continue memantine and Seroquel combination. ? ?Incidental finding of left sacral insufficiency fracture along with L2 compression fracture.  These are chronic and asymptomatic.  Outpatient PCP guided monitoring. ? ?   ? ?Condition - Guarded ? ?Family Communication  :  wife bedside 06/12/21 ? ?Code Status :  Full ? ?Consults  :  Onco ? ?PUD Prophylaxis : Pepcid ? ? Procedures  :    ? ?CT -  1. No evidence for primary malignancy or metastatic disease in the chest, abdomen, or pelvis. 2. Subtle edema/inflammation in the sigmoid mesocolon in the region of an ill-defined diverticulum. Imaging features consistent with sigmoid diverticulitis. No perforation or abscess. 3. Prostatomegaly with evidence of median lobe hypertrophy generating mass-effect on the inferior bladder. 4. Nonacute left-sided rib fractures. 5. 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 6. Aortic Atherosclerosis (ICD10-I70.0) and Emphysema (ICD10-J43.9). ? ?   ? ?Disposition Plan  :   ? ?Status is: Observation ? ?DVT Prophylaxis  :   ? ?SCDs Start: 06/11/21 2135 ?  ? ?Lab Results  ?Component Value Date  ? PLT 27 (LL) 06/13/2021  ? ? ?Diet :  ?Diet Order   ? ?       ?  DIET SOFT Room service appropriate? No; Fluid consistency: Nectar Thick  Diet effective now       ?  ? ?  ?  ? ?  ?  ? ?Inpatient Medications ? ?Scheduled Meds: ? sodium chloride   Intravenous Once  ? atorvastatin  20 mg Oral  QODAY  ? B-complex with vitamin C  1 tablet Oral Daily  ? cholecalciferol  5,000 Units Oral Daily  ? donepezil  10 mg Oral QHS  ? famotidine  40 mg Oral Daily  ? feeding supplement  237 mL Oral BID BM  ? memantine  10 mg Oral BID  ? QUEtiapine  12.5 mg Oral QHS  ? ?Continuous Infusions: ? ? ?PRN Meds:.acetaminophen **OR** acetaminophen, magnesium hydroxide, methocarbamol, ondansetron **OR** ondansetron (ZOFRAN) IV, traZODone ? ?Antibiotics  :   ? ?Anti-infectives (From admission, onward)  ? ? None  ? ?  ? ? ? Time Spent in minutes  30 ? ? ?Lala Lund M.D on 06/13/2021 at 9:54 AM ? ?To page go to [email or web address]  ? ?[facility] -  Office  [phone] ? ?See all Orders from today for further details ? ? ? Objective:  ? ?Vitals:  ? 06/12/21 2052 06/13/21 0000 06/13/21 0500 06/13/21 0754  ?BP:  104/62 107/62 110/69  ?Pulse: 74 70  82  ?Resp: '18 20  20  ' ?Temp:  97.6 ?F (36.4 ?C) 97.8 ?F (36.6 ?C) 97.7 ?F (36.5 ?C)  ?TempSrc:  Oral Oral Oral  ?SpO2: 95% 96%  94%  ? ? ?Wt Readings from Last 3 Encounters:  ?05/20/21 66 kg  ?05/18/21 66.2 kg  ?04/07/21 68.4 kg  ? ? ? ?Intake/Output Summary (Last 24 hours) at 06/13/2021 0954 ?Last data filed at 06/12/2021 2055 ?Gross per 24 hour  ?Intake 352.08 ml  ?Output 100 ml  ?Net 252.08 ml  ? ? ? ?Physical Exam ? ?Awake Alert, No new F.N deficits, Normal affect ?[place].AT,PERRAL ?Supple Neck, No JVD,   ?Symmetrical Chest wall movement, Good air movement bilaterally, CTAB ?RRR,No Gallops, Rubs or new Murmurs,  ?+ve B.Sounds, Abd Soft, No tenderness,   ?No Cyanosis, Clubbing or edema  ?   ? ?  Data Review:  ? ? ?CBC ?Recent Labs  ?Lab 06/11/21 ?1927 06/12/21 ?0243 06/13/21 ?0244  ?WBC 3.6* 3.5* 2.7*  ?HGB 6.9* 7.3* 8.8*  ?HCT 20.6* 22.1* 26.4*  ?PLT 43* 31* 27*  ?MCV 117.0* 106.8* 100.0  ?MCH 39.2* 35.3* 33.3  ?MCHC 33.5 33.0 33.3  ?RDW 15.5 Not Measured 22.6*  ?LYMPHSABS 1.8  --  1.1  ?MONOABS 0.4  --  0.3  ?EOSABS 0.1  --  0.0  ?BASOSABS 0.0  --  0.0  ? ? ?Electrolytes ?Recent Labs  ?Lab  06/11/21 ?1919 06/12/21 ?0243 06/12/21 ?0400 06/13/21 ?0244  ?NA 139 141  --  140  ?K 4.1 3.8  --  3.6  ?CL 105 107  --  107  ?CO2 22 22  --  21*  ?GLUCOSE 94 76  --  89  ?BUN 29* 24*  --  18  ?CREATININE 1.00 0.89  --  0.97  ?CALCIUM 10.1 9.4  --  9.5  ?AST 38  --   --  35  ?ALT 24  --   --  21  ?ALKPHOS 122  --   --  113  ?BILITOT 0.7  --   --  0.8  ?ALBUMIN 3.1*  --   --  2.6*  ?MG  --   --   --  2.0  ?TSH  --   --  1.384  --   ?BNP  --   --   --  318.5*  ? ? ?------------------------------------------------------------------------------------------------------------------ ?No results for input(s): CHOL, HDL, LDLCALC, TRIG, CHOLHDL, LDLDIRECT in the last 72 hours. ? ?Lab Results  ?Component Value Date  ? HGBA1C 6.6 (H) 05/29/2019  ? ? ?Recent Labs  ?  06/12/21 ?0400  ?TSH 1.384  ? ?------------------------------------------------------------------------------------------------------------------ ?ID Labs ?Recent Labs  ?Lab 06/11/21 ?1919 06/11/21 ?1927 06/12/21 ?0243 06/13/21 ?0244  ?WBC  --  3.6* 3.5* 2.7*  ?PLT  --  43* 31* 27*  ?CREATININE 1.00  --  0.89 0.97  ? ?Cardiac Enzymes ?No results for input(s): CKMB, TROPONINI, MYOGLOBIN in the last 168 hours. ? ?Invalid input(s): CK ? ? ?Micro Results ?No results found for this or any previous visit (from the past 240 hour(s)). ? ?Radiology Reports ?CT CHEST ABDOMEN PELVIS W CONTRAST ? ?Result Date: 06/12/2021 ?CLINICAL DATA:  Unintended weight loss and idiopathic polycythemia with pancytopenia. Evaluate for metastatic disease. * Tracking Code: BO * EXAM: CT CHEST, ABDOMEN, AND PELVIS WITH CONTRAST TECHNIQUE: Multidetector CT imaging of the chest, abdomen and pelvis was performed following the standard protocol during bolus administration of intravenous contrast. RADIATION DOSE REDUCTION: This exam was performed according to the departmental dose-optimization program which includes automated exposure control, adjustment of the mA and/or kV according to patient size  and/or use of iterative reconstruction technique. CONTRAST:  118m OMNIPAQUE IOHEXOL 300 MG/ML  SOLN COMPARISON:  Chest CT 12/10/2019. FINDINGS: CT CHEST FINDINGS Cardiovascular: The heart size is normal. No s

## 2021-06-14 ENCOUNTER — Inpatient Hospital Stay (HOSPITAL_COMMUNITY): Payer: Medicare HMO

## 2021-06-14 ENCOUNTER — Other Ambulatory Visit: Payer: Self-pay | Admitting: Hematology and Oncology

## 2021-06-14 DIAGNOSIS — D539 Nutritional anemia, unspecified: Secondary | ICD-10-CM | POA: Insufficient documentation

## 2021-06-14 DIAGNOSIS — D61818 Other pancytopenia: Secondary | ICD-10-CM | POA: Diagnosis not present

## 2021-06-14 LAB — CBC WITH DIFFERENTIAL/PLATELET
Abs Immature Granulocytes: 0.11 10*3/uL — ABNORMAL HIGH (ref 0.00–0.07)
Abs Immature Granulocytes: 0.09 10*3/uL — ABNORMAL HIGH (ref 0.00–0.07)
Basophils Absolute: 0 10*3/uL (ref 0.0–0.1)
Basophils Absolute: 0 10*3/uL (ref 0.0–0.1)
Basophils Relative: 1 %
Basophils Relative: 0 %
Eosinophils Absolute: 0.1 10*3/uL (ref 0.0–0.5)
Eosinophils Absolute: 0.1 10*3/uL (ref 0.0–0.5)
Eosinophils Relative: 3 %
Eosinophils Relative: 2 %
HCT: 27.9 % — ABNORMAL LOW (ref 39.0–52.0)
HCT: 27.1 % — ABNORMAL LOW (ref 39.0–52.0)
Hemoglobin: 9 g/dL — ABNORMAL LOW (ref 13.0–17.0)
Hemoglobin: 9.3 g/dL — ABNORMAL LOW (ref 13.0–17.0)
Immature Granulocytes: 3 %
Immature Granulocytes: 3 %
Lymphocytes Relative: 43 %
Lymphocytes Relative: 42 %
Lymphs Abs: 1.4 10*3/uL (ref 0.7–4.0)
Lymphs Abs: 1.3 10*3/uL (ref 0.7–4.0)
MCH: 33.3 pg (ref 26.0–34.0)
MCH: 34.6 pg — ABNORMAL HIGH (ref 26.0–34.0)
MCHC: 32.3 g/dL (ref 30.0–36.0)
MCHC: 34.3 g/dL (ref 30.0–36.0)
MCV: 103.3 fL — ABNORMAL HIGH (ref 80.0–100.0)
MCV: 100.7 fL — ABNORMAL HIGH (ref 80.0–100.0)
Monocytes Absolute: 0.3 10*3/uL (ref 0.1–1.0)
Monocytes Absolute: 0.4 10*3/uL (ref 0.1–1.0)
Monocytes Relative: 11 %
Monocytes Relative: 13 %
Neutro Abs: 1.2 10*3/uL — ABNORMAL LOW (ref 1.7–7.7)
Neutro Abs: 1.2 10*3/uL — ABNORMAL LOW (ref 1.7–7.7)
Neutrophils Relative %: 39 %
Neutrophils Relative %: 40 %
Platelets: 28 10*3/uL — CL (ref 150–400)
Platelets: 25 10*3/uL — CL (ref 150–400)
RBC: 2.7 MIL/uL — ABNORMAL LOW (ref 4.22–5.81)
RBC: 2.69 MIL/uL — ABNORMAL LOW (ref 4.22–5.81)
RDW: 22.2 % — ABNORMAL HIGH (ref 11.5–15.5)
RDW: 21.6 % — ABNORMAL HIGH (ref 11.5–15.5)
Smear Review: DECREASED
WBC: 3.2 10*3/uL — ABNORMAL LOW (ref 4.0–10.5)
WBC: 3.1 10*3/uL — ABNORMAL LOW (ref 4.0–10.5)
nRBC: 0 % (ref 0.0–0.2)
nRBC: 0 % (ref 0.0–0.2)

## 2021-06-14 LAB — COMPREHENSIVE METABOLIC PANEL
ALT: 21 U/L (ref 0–44)
AST: 35 U/L (ref 15–41)
Albumin: 2.6 g/dL — ABNORMAL LOW (ref 3.5–5.0)
Alkaline Phosphatase: 110 U/L (ref 38–126)
Anion gap: 13 (ref 5–15)
BUN: 18 mg/dL (ref 8–23)
CO2: 20 mmol/L — ABNORMAL LOW (ref 22–32)
Calcium: 9.4 mg/dL (ref 8.9–10.3)
Chloride: 108 mmol/L (ref 98–111)
Creatinine, Ser: 0.91 mg/dL (ref 0.61–1.24)
GFR, Estimated: >60 mL/min (ref >60)
Glucose, Bld: 87 mg/dL (ref 70–99)
Potassium: 3.9 mmol/L (ref 3.5–5.1)
Sodium: 141 mmol/L (ref 135–145)
Total Bilirubin: 0.7 mg/dL (ref 0.3–1.2)
Total Protein: 5.6 g/dL — ABNORMAL LOW (ref 6.5–8.1)

## 2021-06-14 LAB — MAGNESIUM: Magnesium: 1.8 mg/dL (ref 1.7–2.4)

## 2021-06-14 LAB — BRAIN NATRIURETIC PEPTIDE: B Natriuretic Peptide: 171.8 pg/mL — ABNORMAL HIGH (ref 0.0–100.0)

## 2021-06-14 MED ORDER — LIDOCAINE HCL 1 % IJ SOLN
INTRAMUSCULAR | Status: AC
  Filled 2021-06-14: qty 10

## 2021-06-14 MED ORDER — TAMSULOSIN HCL 0.4 MG PO CAPS
0.4000 mg | ORAL_CAPSULE | Freq: Every day | ORAL | Status: DC
  Administered 2021-06-14 – 2021-06-15 (×2): 0.4 mg via ORAL
  Filled 2021-06-14 (×2): qty 1

## 2021-06-14 MED ORDER — FENTANYL CITRATE (PF) 100 MCG/2ML IJ SOLN
INTRAMUSCULAR | Status: AC
  Filled 2021-06-14: qty 2

## 2021-06-14 MED ORDER — MIDAZOLAM HCL 2 MG/2ML IJ SOLN
INTRAMUSCULAR | Status: AC | PRN
  Administered 2021-06-14: .5 mg via INTRAVENOUS

## 2021-06-14 MED ORDER — FENTANYL CITRATE (PF) 100 MCG/2ML IJ SOLN
INTRAMUSCULAR | Status: AC | PRN
  Administered 2021-06-14: 25 ug via INTRAVENOUS

## 2021-06-14 MED ORDER — ENSURE ENLIVE PO LIQD
237.0000 mL | Freq: Three times a day (TID) | ORAL | Status: DC
  Administered 2021-06-14 – 2021-06-15 (×3): 237 mL via ORAL

## 2021-06-14 MED ORDER — LACTATED RINGERS IV SOLN: INTRAVENOUS | Status: AC

## 2021-06-14 MED ORDER — MIDAZOLAM HCL 2 MG/2ML IJ SOLN
INTRAMUSCULAR | Status: AC
  Filled 2021-06-14: qty 2

## 2021-06-14 MED ORDER — LACTATED RINGERS IV BOLUS
500.0000 mL | Freq: Once | INTRAVENOUS | Status: AC
  Administered 2021-06-14: 500 mL via INTRAVENOUS

## 2021-06-14 NOTE — TOC Initial Note (Signed)
Transition of Care (TOC) - Initial/Assessment Note  ? ? ?Patient Details  ?Name: Henry [name] ?MRN: [id number] ?Date of Birth: 01-Oct-1940 ? ?Transition of Care (TOC) CM/SW Contact:    ?Cyndi Bender, RN ?Phone Number: ?06/14/2021, 3:33 PM ? ?Clinical Narrative:                 ?Spoke to patient's wife, [name], regarding transition needs. Patient has used [facility] in the past for [facility] and would like to use them again. Home health orders for PT/OT. Cory with [facility] accepted referral. Wife declines the walker. Wife states patient uses a cane and can't use a walker due to him not being able to use one of his arms.  ?TOC will continue to follow ?Expected Discharge Plan: [place] ?Barriers to Discharge: Continued Medical Work up ? ? ?Patient Goals and CMS Choice ?Patient states their goals for this hospitalization and ongoing recovery are:: return home ?CMS Medicare.gov Compare Post Acute Care list provided to:: Patient Represenative (must comment) ?Choice offered to / list presented to : Spouse ? ?Expected Discharge Plan and Services ?Expected Discharge Plan: [place] ?  ?Discharge Planning Services: CM Consult ?Post Acute Care Choice: Home Health ?Living arrangements for the past 2 months: [place] ?                ?  ?  ?  ?  ?  ?HH Arranged: OT, PT ?[place] Agency: [facility] ?Date HH Agency Contacted: 06/14/21 ?Time [place]: 1300 ?Representative spoke with at [place]: Tommi Rumps ? ?Prior Living Arrangements/Services ?Living arrangements for the past 2 months: [place] ?Lives with:: Spouse ?Patient language and need for interpreter reviewed:: Yes ?Do you feel safe going back to the place where you live?: Yes      ?Need for Family Participation in Patient Care: Yes (Comment) ?Care giver support system in place?: Yes (comment) ?Current home services: DME (cane) ?Criminal Activity/Legal Involvement Pertinent to Current Situation/Hospitalization: No  - Comment as needed ? ?Activities of Daily Living ?Home Assistive Devices/Equipment: Cane (specify quad or straight) ?ADL Screening (condition at time of admission) ?Patient's cognitive ability adequate to safely complete daily activities?: Yes ?Is the patient deaf or have difficulty hearing?: Yes ?Does the patient have difficulty seeing, even when wearing glasses/contacts?: No (only with reading/uses reading glasses) ?Does the patient have difficulty concentrating, remembering, or making decisions?: No ?Patient able to express need for assistance with ADLs?: Yes ?Does the patient have difficulty dressing or bathing?: Yes ?Independently performs ADLs?: No ?Communication: Independent ?Dressing (OT): Needs assistance ?Grooming: Needs assistance ?Feeding: Independent ?Bathing: Needs assistance ?Toileting: Needs assistance ?In/Out Bed: Needs assistance ?Walks in Home: Needs assistance ?Does the patient have difficulty walking or climbing stairs?: Yes ?Weakness of Legs: Both ?Weakness of Arms/Hands: Both ? ?Permission Sought/Granted ?Permission sought to share information with : Case Manager ?Permission granted to share information with : Yes, Verbal Permission Granted ?   ? Permission granted to share info w AGENCY: home health ?   ?   ? ?Emotional Assessment ?  ?  ?  ?  ?Alcohol / Substance Use: Not Applicable ?Psych Involvement: No (comment) ? ?Admission diagnosis:  Pancytopenia ([place]) [D61.818] ?Symptomatic anemia [D64.9] ?Patient Active Problem List  ? Diagnosis Date Noted  ? Deficiency anemia 06/14/2021  ? Dementia with behavioral disturbance ([place]) 06/12/2021  ? Symptomatic anemia 06/12/2021  ? Pancytopenia ([place]) 06/11/2021  ? Loss of appetite 04/07/2021  ? Chronic  left-sided lumbar radiculopathy 03/22/2021  ? 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 Fall against object 12/31/2020  ? Fatigue 12/31/2020  ? Neoplasm of uncertain behavior of skin 12/31/2020  ? History of cardioembolic cerebrovascular accident (CVA)  08/31/2020  ? Atherosclerosis of aorta ([place]) 06/22/2020  ? Upper respiratory disease 03/30/2020  ? 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 Confusion 09/05/2019  ? Memory loss 09/05/2019  ? Hemiparesis affecting left side as late effect of stroke ([place]) 09/05/2019  ? DVT (deep venous thrombosis) ([place]) 08/26/2019  ? Carotid stenosis, asymptomatic, bilateral 08/26/2019  ? Intertrigo 08/26/2019  ? Constipation 08/08/2019  ? Vertigo 06/27/2019  ? Dyslipidemia 05/29/2019  ? Left shoulder pain 05/19/2019  ? History of embolic stroke 24/26/8341  ? Acute respiratory failure ([place])   ? 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 Chest wall pain 07/04/2016  ? Lumbar stenosis with neurogenic claudication 05/20/2016  ? BPH (benign prostatic hyperplasia) 12/01/2015  ? Heel pain 05/13/2015  ? Inguinal hernia 03/19/2014  ? Left hip pain 01/10/2014  ? HTN (hypertension) 01/10/2014  ? Well adult exam 09/12/2011  ? Personal history of colonic polyps 06/07/2010  ? GERD 03/16/2010  ? Cough 08/11/2009  ? POLYCYTHEMIA 07/01/2009  ? Osteoarthritis 04/17/2007  ? LOW BACK PAIN 04/17/2007  ? LEG PAIN 04/17/2007  ? PARESTHESIA 04/17/2007  ? ?PCP:  [name], [name], MD ?Pharmacy:   ?[place] [place]. ?[place] ?[place] [address] ?Phone: [phone] Fax: [phone] ? ? ? ? ?Social Determinants of Health (SDOH) Interventions ?  ? ?Readmission Risk Interventions ?   ? View : No data to display.  ?  ?  ?  ? ? ? ?

## 2021-06-14 NOTE — Progress Notes (Signed)
?                                  PROGRESS NOTE                                             ?                                                                                                                     ?                                         ? ? Patient Demographics:  ? ? Henry [name], is a 81 y.o. male, DOB - 1940/06/14, [id number] ? ?Outpatient Primary MD for the patient is [name], [name], MD    LOS - 2  Admit date - 06/11/2021   ? ?Chief Complaint  ?Patient presents with  ? Anemia  ?    ? ?Brief Narrative (HPI from H&P)    81 y.o. male with medical history significant for GERD, osteoarthritis and polycythemia as well as CVA, who presented to the ER with acute onset of generalized weakness and fatigue with no energy.  Found to have pancytopenia with severe symptomatic anemia with hemoglobin of 6 and admitted to the hospital.  No frank bleeding or melena. ? ? Subjective:  ? ?Patient in bed, appears comfortable, denies any headache, no fever, no chest pain or pressure, no shortness of breath , no abdominal pain. No new focal weakness. ? ? Assessment  & Plan :  ? ? ?Symptomatic anemia in a patient with pancytopenia - he does have previous history of polycythemia, no recent infection or medication changes, no bleeding or melena, he has received 2 units of packed RBC so far last on 06/12/2021, initial smear review by technician is unremarkable however his LDH is somewhat elevated and has previous history of polycythemia as well, seen by oncology, CT chest abdomen pelvis nonacute except for mildly enlarged prostate and some old asymptomatic L2 and left sacrum fracture.  PSA levels relatively stable.  He underwent bone marrow biopsy by IR on 06/14/2021, bone scan per hematology, likely discharge in 1 to 2 days with outpatient hematology oncology follow-up. ? ?Dyslipidemia - We will continue statin therapy. ? ?BPH - We will continue Flomax.  Outpatient urology  follow-up. ? ?Dementia with behavioral disturbance ([place])  - We will continue Aricept and Namenda. ? ?Severe generalized weakness and deconditioning.  Due to #1 above.  Address as in #1 above, PT OT.  Will require home health PT. ? ?Dyslipidemia.  Started on statin. ? 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  Almost resolved weakness, dual antiplatelet therapy held  due to severe pancytopenia, statin continued for secondary prevention.  PT OT and supportive care. ? ?Mild underlying dementia at risk for delirium/encephalopathy.  Supportive care.  Continue memantine and Seroquel combination. ? ?Incidental finding of left sacral insufficiency fracture along with L2 compression fracture.  These are chronic and asymptomatic.  Outpatient PCP guided monitoring. ? ?   ? ?Condition - Guarded ? ?Family Communication  :  wife bedside 06/12/21, 06/13/2021, 06/15/2018 ? ?Code Status :  Full ? ?Consults  :  Onco ? ?PUD Prophylaxis : Pepcid ? ? Procedures  :    ? ?BMBx right posterior ilium on 06/14/2021 ? ?CT -  1. No evidence for primary malignancy or metastatic disease in the chest, abdomen, or pelvis. 2. Subtle edema/inflammation in the sigmoid mesocolon in the region of an ill-defined diverticulum. Imaging features consistent with sigmoid diverticulitis. No perforation or abscess. 3. Prostatomegaly with evidence of median lobe hypertrophy generating mass-effect on the inferior bladder. 4. Nonacute left-sided rib fractures. 5. 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 6. Aortic Atherosclerosis (ICD10-I70.0) and Emphysema (ICD10-J43.9). ? ?   ? ?Disposition Plan  :   ? ?Status is: Observation ? ?DVT Prophylaxis  :   ? ?SCDs Start: 06/11/21 2135 ?  ? ?Lab Results  ?Component Value Date  ? PLT 28 (LL) 06/14/2021  ? ? ?Diet :  ?Diet Order   ? ?       ?  Diet Heart Room service appropriate? Yes; Fluid consistency: Thin  Diet effective now       ?  ? ?  ?  ? ?  ?  ? ?Inpatient  Medications ? ?Scheduled Meds: ? sodium chloride   Intravenous Once  ? atorvastatin  20 mg Oral QODAY  ? B-complex with vitamin C  1 tablet Oral Daily  ? cholecalciferol  5,000 Units Oral Daily  ? donepezil  10 mg Oral QHS  ? famotidine  40 mg Oral Daily  ? feeding supplement  237 mL Oral BID BM  ? fentaNYL      ? lidocaine      ? memantine  10 mg Oral BID  ? midazolam      ? QUEtiapine  12.5 mg Oral QHS  ? ?Continuous Infusions: ? lactated ringers 75 mL/hr at 06/14/21 1017  ? ? ?PRN Meds:.acetaminophen **OR** acetaminophen, magnesium hydroxide, methocarbamol, ondansetron **OR** ondansetron (ZOFRAN) IV, traZODone ? ?Antibiotics  :   ? ?Anti-infectives (From admission, onward)  ? ? None  ? ?  ? ? ? Time Spent in minutes  30 ? ? ?Lala Lund M.D on 06/14/2021 at 10:33 AM ? ?To page go to [email or web address]  ? ?[facility] -  Office  [phone] ? ?See all Orders from today for further details ? ? ? Objective:  ? ?Vitals:  ? 06/14/21 0852 06/14/21 0910 06/14/21 0915 06/14/21 0930  ?BP: 117/66 92/64 (!) 88/60 100/64  ?Pulse: 70 69 65 69  ?Resp: '20 15 14 16  ' ?Temp:      ?TempSrc:      ?SpO2: 100% 100% 100% 100%  ? ? ?Wt Readings from Last 3 Encounters:  ?05/20/21 66 kg  ?05/18/21 66.2 kg  ?04/07/21 68.4 kg  ? ? ?No intake or output data in the 24 hours ending 06/14/21 1033 ? ? ? ?Physical Exam ? ?Awake Alert, No new F.N deficits, Normal affect ?[place].AT,PERRAL ?Supple Neck, No JVD,   ?Symmetrical Chest wall movement, Good air movement bilaterally, CTAB ?RRR,No Gallops, Rubs or new Murmurs,  ?+  ve B.Sounds, Abd Soft, No tenderness,   ?No Cyanosis, Clubbing or edema  ? ?   ? ? Data Review:  ? ? ?CBC ?Recent Labs  ?Lab 06/11/21 ?1927 06/12/21 ?0243 06/13/21 ?0244 06/14/21 ?0155  ?WBC 3.6* 3.5* 2.7* 3.2*  ?HGB 6.9* 7.3* 8.8* 9.0*  ?HCT 20.6* 22.1* 26.4* 27.9*  ?PLT 43* 31* 27* 28*  ?MCV 117.0* 106.8* 100.0 103.3*  ?MCH 39.2* 35.3* 33.3 33.3  ?MCHC 33.5 33.0 33.3 32.3  ?RDW 15.5 Not Measured 22.6* 22.2*  ?LYMPHSABS 1.8  --   1.1 1.4  ?MONOABS 0.4  --  0.3 0.3  ?EOSABS 0.1  --  0.0 0.1  ?BASOSABS 0.0  --  0.0 0.0  ? ? ?Electrolytes ?Recent Labs  ?Lab 06/11/21 ?1919 06/12/21 ?0243 06/12/21 ?0400 06/13/21 ?0244 06/14/21 ?0155  ?NA 139 141  --  140 141  ?K 4.1 3.8  --  3.6 3.9  ?CL 105 107  --  107 108  ?CO2 22 22  --  21* 20*  ?GLUCOSE 94 76  --  89 87  ?BUN 29* 24*  --  18 18  ?CREATININE 1.00 0.89  --  0.97 0.91  ?CALCIUM 10.1 9.4  --  9.5 9.4  ?AST 38  --   --  35 35  ?ALT 24  --   --  21 21  ?ALKPHOS 122  --   --  113 110  ?BILITOT 0.7  --   --  0.8 0.7  ?ALBUMIN 3.1*  --   --  2.6* 2.6*  ?MG  --   --   --  2.0 1.8  ?TSH  --   --  1.384  --   --   ?BNP  --   --   --  318.5* 171.8*  ? ? ?------------------------------------------------------------------------------------------------------------------ ?No results for input(s): CHOL, HDL, LDLCALC, TRIG, CHOLHDL, LDLDIRECT in the last 72 hours. ? ?Lab Results  ?Component Value Date  ? HGBA1C 6.6 (H) 05/29/2019  ? ? ?Recent Labs  ?  06/12/21 ?0400  ?TSH 1.384  ? ?------------------------------------------------------------------------------------------------------------------ ?ID Labs ?Recent Labs  ?Lab 06/11/21 ?1919 06/11/21 ?1927 06/12/21 ?0243 06/13/21 ?0244 06/14/21 ?0155  ?WBC  --  3.6* 3.5* 2.7* 3.2*  ?PLT  --  43* 31* 27* 28*  ?CREATININE 1.00  --  0.89 0.97 0.91  ? ?Cardiac Enzymes ?No results for input(s): CKMB, TROPONINI, MYOGLOBIN in the last 168 hours. ? ?Invalid input(s): CK ? ? ?Micro Results ?No results found for this or any previous visit (from the past 240 hour(s)). ? ?Radiology Reports ?CT CHEST ABDOMEN PELVIS W CONTRAST ? ?Result Date: 06/12/2021 ?CLINICAL DATA:  Unintended weight loss and idiopathic polycythemia with pancytopenia. Evaluate for metastatic disease. * Tracking Code: BO * EXAM: CT CHEST, ABDOMEN, AND PELVIS WITH CONTRAST TECHNIQUE: Multidetector CT imaging of the chest, abdomen and pelvis was performed following the standard protocol during bolus  administration of intravenous contrast. RADIATION DOSE REDUCTION: This exam was performed according to the departmental dose-optimization program which includes automated exposure control, adjustment of the mA and/or kV accordi

## 2021-06-14 NOTE — Procedures (Signed)
Interventional Radiology Procedure Note ? ?Date of Procedure: 06/14/2021  ?Procedure: BMBx ? ?Findings:  ?1. CT BMBx right posterior ilium   ? ?Complications: No immediate complications noted.  ? ?Estimated Blood Loss: minimal ? ?Follow-up and Recommendations: ?1. Bedrest 1 hour  ? ? ?Albin Felling, MD  ?Vascular & Interventional Radiology  ?06/14/2021 9:32 AM ? ? ? ?

## 2021-06-14 NOTE — Progress Notes (Signed)
?  Transition of Care (TOC) Screening Note ? ? ?Patient Details  ?Name: Henry [name] ?Date of Birth: 1940/11/26 ? ? ?Transition of Care (TOC) CM/SW Contact:    ?Cyndi Bender, RN ?Phone Number: ?06/14/2021, 9:28 AM ? ? ? ?Transition of Care Department [facility]) has reviewed patient and no TOC needs have been identified at this time. We will continue to monitor patient advancement through interdisciplinary progression rounds. If new patient transition needs arise, please place a TOC consult. ? ? ?

## 2021-06-14 NOTE — Progress Notes (Addendum)
Initial Nutrition Assessment ? ?DOCUMENTATION CODES:  ? ?Severe malnutrition in context of chronic illness ? ?INTERVENTION:  ? ?Increase Ensure Enlive po to TID, each supplement provides 350 kcal and 20 grams of protein. (Pt prefers chocolate) ? ?Encourage PO intake ?Reviewed menu  ? ? ?NUTRITION DIAGNOSIS:  ? ?Severe Malnutrition related to chronic illness as evidenced by severe muscle depletion, severe fat depletion. ? ?GOAL:  ? ?Patient will meet greater than or equal to 90% of their needs ? ?MONITOR:  ? ?PO intake, Supplement acceptance ? ?REASON FOR ASSESSMENT:  ? ?Malnutrition Screening Tool ?  ? ?ASSESSMENT:  ? ?Pt with PMH of GERD, OA, CVA, and polycythemia admitted with acute onset generalized weakness and fatigue dx with pancytopenia and symptomatic anemia.  ? ?Spoke with pt and his wife who was at bedside. They report that in 2021 he was 185 lb prior to his stroke and he lost a lot of weight when he was in rehab for 3 weeks because he did not like the food there. He regained weight when he returned home.  ?Pt reports since 02/2021 he has not had a appetite. He has been losing weight since that time. Weight loss was noted by his PCP 05/31/21 and was started on Megace 40 mg total BID (not dose recommended to increase appetite) ?He has had a 10% weight loss x 4 months.  ?Pt states he uses a cane when walking. He is unable to use a walker due to L arm weakness after CVA in 2021. Per wife did well with therapy but is losing strength again in his L arm, unable to grasp my hand during exam. He is able to move LLE.  ? ?Breakfast is his best meal: egg, sausage on a roll ?Lunch: none ?Dinner: meat and veggies ?Ensure x 2 for the last week.  ? ?Pt was able to eat 100% of his lunch today, tuna sandwich and apple cobbler which he enjoyed.  ? ?He is willing to drink ensure while here for weight gain.  ? ?Weight trends: ?Admission: 66 kg - 145 lb ?Jan 2023 : 70 kg - 154 lb ?Nov 2022: 73.9 kg - 162 lb  ? ? 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  ? ?4/10 s/p bone marrow biopsy in IR ? ?Medications reviewed and include: B-complex with C daily, vitamin D3 5000 IU daily, pepcid ?LR @ 75 ml/hr ? ?Labs reviewed:  ?TIBC: 224 ?Ferritin: 491 ?Vitamin B12: 933 ?  ? ?NUTRITION - FOCUSED PHYSICAL EXAM: ?  ?Flowsheet Row Most Recent Value  ?Orbital Region Severe depletion  ?Upper Arm Region Severe depletion  ?Thoracic and Lumbar Region Severe depletion  ?Buccal Region Severe depletion  ?Temple Region Severe depletion  ?Clavicle Bone Region Severe depletion  ?Clavicle and Acromion Bone Region Severe depletion  ?Scapular Bone Region Severe depletion  ?Dorsal Hand Severe depletion  ?Patellar Region Severe depletion  ?Anterior Thigh Region Severe depletion  ?Posterior Calf Region Severe depletion  ?Edema (RD Assessment) None  ?Hair Reviewed  ?Eyes Reviewed  ?Mouth Reviewed  ?Skin Reviewed  ?Nails Reviewed  ? ?  ? ? ?Diet Order:   ?Diet Order   ? ?       ?  Diet Heart Room service appropriate? Yes; Fluid consistency: Thin  Diet effective now       ?  ? ?  ?  ? ?  ? ? ?EDUCATION NEEDS:  ? ?Education needs have been addressed ? ?Skin:  Skin Assessment: Reviewed RN Assessment ? ?  Last BM:  4/5 ? ?Height:  ? ?Ht Readings from Last 1 Encounters:  ?05/20/21 5' 9" (1.753 m)  ? ? ?Weight:  ? ?Wt Readings from Last 1 Encounters:  ?05/20/21 66 kg  ? ? ?BMI:  There is no height or weight on file to calculate BMI. ? ?Estimated Nutritional Needs:  ? ?Kcal:  2000-2200 ? ?Protein:  100-120 grams ? ?Fluid:  > 2 L/day ? ?Lockie Pares., RD, LDN, CNSC ?See [facility] for contact information  ? ?

## 2021-06-15 DIAGNOSIS — D61818 Other pancytopenia: Secondary | ICD-10-CM | POA: Diagnosis not present

## 2021-06-15 LAB — COMPREHENSIVE METABOLIC PANEL
ALT: 20 U/L (ref 0–44)
AST: 29 U/L (ref 15–41)
Albumin: 2.4 g/dL — ABNORMAL LOW (ref 3.5–5.0)
Alkaline Phosphatase: 114 U/L (ref 38–126)
Anion gap: 9 (ref 5–15)
BUN: 18 mg/dL (ref 8–23)
CO2: 23 mmol/L (ref 22–32)
Calcium: 9.5 mg/dL (ref 8.9–10.3)
Chloride: 110 mmol/L (ref 98–111)
Creatinine, Ser: 0.73 mg/dL (ref 0.61–1.24)
GFR, Estimated: >60 mL/min (ref >60)
Glucose, Bld: 135 mg/dL — ABNORMAL HIGH (ref 70–99)
Potassium: 3.6 mmol/L (ref 3.5–5.1)
Sodium: 142 mmol/L (ref 135–145)
Total Bilirubin: 0.5 mg/dL (ref 0.3–1.2)
Total Protein: 5.2 g/dL — ABNORMAL LOW (ref 6.5–8.1)

## 2021-06-15 LAB — CBC WITH DIFFERENTIAL/PLATELET
Abs Immature Granulocytes: 0.1 10*3/uL — ABNORMAL HIGH (ref 0.00–0.07)
Basophils Absolute: 0 10*3/uL (ref 0.0–0.1)
Basophils Relative: 0 %
Eosinophils Absolute: 0.1 10*3/uL (ref 0.0–0.5)
Eosinophils Relative: 3 %
HCT: 26.4 % — ABNORMAL LOW (ref 39.0–52.0)
Hemoglobin: 8.9 g/dL — ABNORMAL LOW (ref 13.0–17.0)
Immature Granulocytes: 4 %
Lymphocytes Relative: 41 %
Lymphs Abs: 1.1 10*3/uL (ref 0.7–4.0)
MCH: 33.7 pg (ref 26.0–34.0)
MCHC: 33.7 g/dL (ref 30.0–36.0)
MCV: 100 fL (ref 80.0–100.0)
Monocytes Absolute: 0.3 10*3/uL (ref 0.1–1.0)
Monocytes Relative: 11 %
Neutro Abs: 1.1 10*3/uL — ABNORMAL LOW (ref 1.7–7.7)
Neutrophils Relative %: 41 %
Platelets: 26 10*3/uL — CL (ref 150–400)
RBC: 2.64 MIL/uL — ABNORMAL LOW (ref 4.22–5.81)
RDW: 20.6 % — ABNORMAL HIGH (ref 11.5–15.5)
WBC: 2.6 10*3/uL — ABNORMAL LOW (ref 4.0–10.5)
nRBC: 0 % (ref 0.0–0.2)

## 2021-06-15 LAB — MAGNESIUM: Magnesium: 2 mg/dL (ref 1.7–2.4)

## 2021-06-15 LAB — ERYTHROPOIETIN: Erythropoietin: 385 m[IU]/mL — ABNORMAL HIGH (ref 2.6–18.5)

## 2021-06-15 LAB — BRAIN NATRIURETIC PEPTIDE: B Natriuretic Peptide: 182.3 pg/mL — ABNORMAL HIGH (ref 0.0–100.0)

## 2021-06-15 NOTE — Progress Notes (Signed)
AVS paperwork and discharge education provided. IV removed. Pt and pt belonging transported to entrance A via pt transport in wheelchair to private vehicle. ?

## 2021-06-15 NOTE — Discharge Instructions (Signed)
Follow with Primary MD [name], [name], MD in 7 days  ? ?Get CBC, CMP, 2 view Chest X ray -  checked next visit within 1 week by Primary MD   ? ?Activity: As tolerated with Full fall precautions use walker/cane & assistance as needed ? ?Disposition Home   ? ?Diet: Heart Healthy   ? ?Special Instructions: If you have smoked or chewed Tobacco  in the last 2 yrs please stop smoking, stop any regular Alcohol  and or any Recreational drug use. ? ?On your next visit with your primary care physician please Get Medicines reviewed and adjusted. ? ?Please request your Prim.MD to go over all Hospital Tests and Procedure/Radiological results at the follow up, please get all Hospital records sent to your Prim MD by signing hospital release before you go home. ? ?If you experience worsening of your admission symptoms, develop shortness of breath, life threatening emergency, suicidal or homicidal thoughts you must seek medical attention immediately by calling 911 or calling your MD immediately  if symptoms less severe. ? ?You Must read complete instructions/literature along with all the possible adverse reactions/side effects for all the Medicines you take and that have been prescribed to you. Take any new Medicines after you have completely understood and accpet all the possible adverse reactions/side effects.  ? ?  ?

## 2021-06-15 NOTE — TOC Transition Note (Signed)
Transition of Care (TOC) - CM/SW Discharge Note ? ? ?Patient Details  ?Name: Henry [name] ?MRN: [id number] ?Date of Birth: 05/04/1940 ? ?Transition of Care (TOC) CM/SW Contact:  ?Cyndi Bender, RN ?Phone Number: ?06/15/2021, 9:28 AM ? ? ?Clinical Narrative:    ?Patient stable for discharge.  ?Notified Cory with [facility] of discharge. Wife can transport patient home. Wife declines walker at this time. ?Address, Phone number and PCP verified.  ? ?  ?Barriers to Discharge: Continued Medical Work up ? ? ?Patient Goals and CMS Choice ?Patient states their goals for this hospitalization and ongoing recovery are:: return home ?CMS Medicare.gov Compare Post Acute Care list provided to:: Patient Represenative (must comment) ?Choice offered to / list presented to : Spouse ? ?Discharge Placement ?  ?          home ?  ?  ?  ?  ? ?Discharge Plan and Services ?  ?Discharge Planning Services: CM Consult ?Post Acute Care Choice: Home Health          ?  ?  ?  ?  ?  ?HH Arranged: OT, PT ?[facility] Agency: [facility] ?Date HH Agency Contacted: 06/14/21 ?Time [place]: 1300 ?Representative spoke with at [facility]: Tommi Rumps ? ?Social Determinants of Health (SDOH) Interventions ?  ? ? ?Readmission Risk Interventions ? ?  06/15/2021  ?  9:27 AM  ?Readmission Risk Prevention Plan  ?Post Dischage Appt Complete  ?Medication Screening Complete  ?Transportation Screening Complete  ? ? ? ? ? ?

## 2021-06-15 NOTE — Discharge Summary (Signed)
?                                                                                ? ?Henry [name] HAL:[id number] DOB: [birth year] DOA: 06/11/2021 ? ?PCP: [name], [name], MD ? ?Admit date: 06/11/2021  Discharge date: 06/15/2021 ? ?Admitted From: Home   Disposition:  Home ? ? ?Recommendations for Outpatient Follow-up:  ? ?Follow up with PCP in 1-2 weeks ? ?PCP Please obtain BMP/CBC, 2 view CXR in 1week,  (see Discharge instructions)  ? ?PCP Please follow up on the following pending results: May benefit from outpatient palliative care/hospice.  Not a good candidate for chemotherapy. ? ? ?Home Health: PT,OT   ?Equipment/Devices: as below  ?Consultations: Oncology, IR ?Discharge Condition: Guarded ?CODE STATUS: Full    ?Diet Recommendation: Heart Healthy  ? ?Diet Order   ? ?       ?  Diet - low sodium heart healthy       ?  ?  Diet Heart Room service appropriate? Yes; Fluid consistency: Thin  Diet effective now       ?  ? ?  ?  ? ?  ?  ? ?Chief Complaint  ?Patient presents with  ? Anemia  ?  ? ?Brief history of present illness from the day of admission and additional interim summary   ? ?81 y.o. male with medical history significant for GERD, osteoarthritis and polycythemia as well as CVA, who presented to the ER with acute onset of generalized weakness and fatigue with no energy.  Found to have pancytopenia with severe symptomatic anemia with hemoglobin of 6 and admitted to the hospital.  No frank bleeding or melena. ? ?                                                               Hospital Course  ? ?Symptomatic anemia in a patient with pancytopenia - he does have previous history of polycythemia, no recent infection or medication changes, no bleeding or melena, he has received 2 units of packed RBC so far last on 06/12/2021, initial smear review by technician is unremarkable however his LDH was somewhat elevated, CT chest abdomen  pelvis nonacute except for mildly enlarged prostate and some old asymptomatic L2 and left sacrum fracture.  PSA levels relatively stable.  He underwent bone marrow biopsy by IR on 06/14/2021, bone scan per hematology outpatient, he is currently feeling better will be discharged home.  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  Will follow closely outpatient with PCP and oncology, request PCP to encourage having palliative care discussions.  Bone marrow biopsy results are pending as is bone scan he will follow with oncology outpatient for final results and plan. ?  ?Dyslipidemia - We will continue statin therapy. ?  ?BPH - We will continue Flomax.  Outpatient urology follow-up. ?  ?  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 ?  ?Severe generalized weakness and deconditioning.  Due to #1 above.  Address as in #1 above, PT OT.  Will require home health PT. ?  ?Dyslipidemia.  Started on statin. ?  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  Almost resolved weakness, antiplatelet therapy held due to severe pancytopenia, statin continued for secondary prevention.  PT OT at home will continue.. ?  ?Mild underlying dementia at risk for delirium/encephalopathy.  Supportive care.  Continue memantine and Seroquel combination. ?  ?Incidental finding of left sacral insufficiency fracture along with L2 compression fracture.  These are chronic and asymptomatic.  Outpatient PCP guided monitoring. ? ? ?Discharge diagnosis   ? ? ?Principal Problem: ?  Pancytopenia ([place]) ?Active Problems: ?  Symptomatic anemia ?  BPH (benign prostatic hyperplasia) ?  Dyslipidemia ?  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 ? ? ?Discharge instructions   ? ?Discharge Instructions   ? ? Diet - low sodium heart healthy   Complete  by: As directed ?  ? Discharge instructions   Complete by: As directed ?  ? Follow with Primary MD [name], [name], MD in 7 days  ? ?Get CBC, CMP, 2 view Chest X ray -  checked next visit within 1 week by Primary MD   ? ?Activity: As tolerated with Full fall precautions use walker/cane & assistance as needed ? ?Disposition Home   ? ?Diet: Heart Healthy   ? ?Special Instructions: If you have smoked or chewed Tobacco  in the last 2 yrs please stop smoking, stop any regular Alcohol  and or any Recreational drug use. ? ?On your next visit with your primary care physician please Get Medicines reviewed and adjusted. ? ?Please request your Prim.MD to go over all Hospital Tests and Procedure/Radiological results at the follow up, please get all Hospital records sent to your Prim MD by signing hospital release before you go home. ? ?If you experience worsening of your admission symptoms, develop shortness of breath, life threatening emergency, suicidal or homicidal thoughts you must seek medical attention immediately by calling 911 or calling your MD immediately  if symptoms less severe. ? ?You Must read complete instructions/literature along with all the possible adverse reactions/side effects for all the Medicines you take and that have been prescribed to you. Take any new Medicines after you have completely understood and accpet all the possible adverse reactions/side effects.  ? Increase activity slowly   Complete by: As directed ?  ? No wound care   Complete by: As directed ?  ? ?  ? ? ?Discharge Medications  ? ?Allergies as of 06/15/2021   ?No Known Allergies ?  ? ?  ?Medication List  ?  ? ?STOP taking these medications   ? ?aspirin EC 81 MG tablet ?  ?celecoxib 200 MG capsule ?Commonly known as: CeleBREX ?  ?methocarbamol 500 MG tablet ?Commonly known as: ROBAXIN ?  ? ?  ? ?TAKE these medications   ? ?acetaminophen 325 MG tablet ?Commonly known as: TYLENOL ?Take 650 mg by mouth every 6 (six) hours as needed for  moderate pain. ?  ?atorvastatin 40 MG tablet ?Commonly known as: LIPITOR ?TAKE ONE (1) TABLET BY MOUTH EVERY DAY AT 6PM. ?What changed: See the new instructions. ?  ?B Complex Plus Tabs ?Take 1 tablet by mouth daily. ?  ?donepezil 10 MG tablet ?Commonly known as: ARICEPT ?Take 1 tablet (10 mg  total) by mouth at bedtime. ?  ?megestrol 40 MG tablet ?Commonly known as: MEGACE ?Take 1 tablet (40 mg total) by mouth 2 (two) times daily. ?  ?memantine 10 MG tablet ?Commonly known as: NAMENDA ?TAKE 1 TABLET BY MOUTH TWICE DAILY. ?  ?Prostate Health Caps ?Take 1 capsule by mouth in the morning and at bedtime. ?  ?QUEtiapine 25 MG tablet ?Commonly known as: SEROQUEL ?Take 0.5 tablets (12.5 mg total) by mouth at bedtime. ?What changed: how much to take ?  ?tamsulosin 0.4 MG Caps capsule ?Commonly known as: FLOMAX ?Take 0.4 mg by mouth daily. ?  ?Vitamin D3 50 MCG (2000 UT) capsule ?Take 1 capsule (2,000 Units total) by mouth daily. ?  ? ?  ? ?  ?  ? ? ?  ?Durable Medical Equipment  ?(From admission, onward)  ?  ? ? ?  ? ?  Start     Ordered  ? 06/14/21 1037  For home use only DME Walker rolling  Once       ?Comments: 5 wheel  ?Question Answer Comment  ?Walker: With 5 Inch Wheels   ?Patient needs a walker to treat with the following condition Weakness   ?  ? 06/14/21 1036  ? ?  ?  ? ?  ? ? ? Follow-up Information   ? ? [name], [name], MD. Schedule an appointment as soon as possible for a visit in 1 week(s).   ?Specialty: Internal Medicine ?Contact information: ?[place] [id number] ?[phone] ? ? ?  ?  ? ? Heath Lark, MD. Schedule an appointment as soon as possible for a visit in 1 week(s).   ?Specialty: Hematology and Oncology ?Contact information: ?[address] ?[place] [id number] ?[phone] ? ? ?  ?  ? ?  ?  ? ?  ? ? ?Major procedures and Radiology Reports - PLEASE review detailed and final reports thoroughly  -    ? ?  ?CT CHEST ABDOMEN PELVIS W CONTRAST ? ?Result Date:  06/12/2021 ?CLINICAL DATA:  Unintended weight loss and idiopathic polycythemia with pancytopenia. Evaluate for metastatic disease. * Tracking Code: BO * EXAM: CT CHEST, ABDOMEN, AND PELVIS WITH CONTRAST TE

## 2021-06-16 ENCOUNTER — Telehealth: Payer: Self-pay

## 2021-06-16 ENCOUNTER — Other Ambulatory Visit: Payer: Self-pay | Admitting: Hematology and Oncology

## 2021-06-16 DIAGNOSIS — D472 Monoclonal gammopathy: Secondary | ICD-10-CM

## 2021-06-16 LAB — SURGICAL PATHOLOGY

## 2021-06-16 NOTE — Telephone Encounter (Signed)
Transition Care Management Follow-up Telephone Call ?Date of discharge and from where: [facility] 06-15-21 Dx: pancytopenia ?How have you been since you were released from the hospital? Doing ok just having a hard time walking  ?Any questions or concerns? No ? ?Items Reviewed: ?Did the pt receive and understand the discharge instructions provided? Yes  ?Medications obtained and verified? Yes  ?Other? No  ?Any new allergies since your discharge? No  ?Dietary orders reviewed? Yes ?Do you have support at home? Yes  ? ?Home Care and Equipment/Supplies: ?Were home health services ordered? Yes PT/OT ?If so, what is the name of the agency? [facility]  ?Has the agency set up a time to come to the patient's home? no ?Were any new equipment or medical supplies ordered?  No ?What is the name of the medical supply agency? na ?Were you able to get the supplies/equipment? not applicable ?Do you have any questions related to the use of the equipment or supplies? No ? ?Functional Questionnaire: (I = Independent and D = Dependent) ?ADLs: D ? ?Bathing/Dressing- D ? ?Meal Prep- D ? ?Eating- I ? ?Maintaining continence- I ? ?Transferring/Ambulation- D needs walker  ? ?Managing Meds- D ? ?Follow up appointments reviewed: ? ?PCP Hospital f/u appt confirmed? Yes  Scheduled to see Dr Alain Marion on 06-23-21 @ 910am. ?[facility] f/u appt confirmed? Yes  Scheduled to see Dr Letta Pate on 06-29-21 @ 230pm. ?Are transportation arrangements needed? No  ?If their condition worsens, is the pt aware to call PCP or go to the Emergency Dept.? Yes ?Was the patient provided with contact information for the PCP's office or ED? Yes ?Was to pt encouraged to call back with questions or concerns? Yes  ?

## 2021-06-16 NOTE — Telephone Encounter (Signed)
-----   Message from Heath Lark, MD sent at 06/16/2021  2:09 PM EDT ----- ?I received a call from pathologist, results of bone marrow is available sooner than I thought ?I can see him on Friday at 140 pm. He needs to come in at 1 pm for labs. Please schedule 60 mins appt ? ?

## 2021-06-16 NOTE — Telephone Encounter (Signed)
Spoke with pt's wife, [name] regarding appt for pt per MD. Mrs Hippert states she will try to bring him and be here by 1245 Friday for check in, but since being in the hospital his mobility has decreased tremendously as no one assisted the pt to walk while inpt. Mrs Schnitzer states she will have to get him in the car but will need some assistance getting him out of the car and need a w/c when she gets here. Advised her we would have a w/c available. Message sent to scheduling.  ?

## 2021-06-17 ENCOUNTER — Telehealth: Payer: Self-pay

## 2021-06-17 NOTE — Telephone Encounter (Signed)
Marden Noble is requesting verbal orders on PT for a frequency of: ?1 time a week for 1 week ?2 times a week for 3 weeks ?1 time a week for 4 weeks ? ?Doug CB [phone] ?

## 2021-06-18 ENCOUNTER — Telehealth: Payer: Self-pay | Admitting: Internal Medicine

## 2021-06-18 ENCOUNTER — Encounter: Payer: Self-pay | Admitting: Hematology and Oncology

## 2021-06-18 ENCOUNTER — Inpatient Hospital Stay: Payer: Medicare HMO | Admitting: Hematology and Oncology

## 2021-06-18 ENCOUNTER — Inpatient Hospital Stay: Payer: Medicare HMO | Attending: Hematology and Oncology

## 2021-06-18 DIAGNOSIS — Z5111 Encounter for antineoplastic chemotherapy: Secondary | ICD-10-CM | POA: Insufficient documentation

## 2021-06-18 DIAGNOSIS — C8510 Unspecified B-cell lymphoma, unspecified site: Secondary | ICD-10-CM

## 2021-06-18 DIAGNOSIS — F03918 Unspecified dementia, unspecified severity, with other behavioral disturbance: Secondary | ICD-10-CM

## 2021-06-18 DIAGNOSIS — Z5112 Encounter for antineoplastic immunotherapy: Secondary | ICD-10-CM | POA: Diagnosis not present

## 2021-06-18 DIAGNOSIS — C9000 Multiple myeloma not having achieved remission: Secondary | ICD-10-CM | POA: Diagnosis not present

## 2021-06-18 DIAGNOSIS — D472 Monoclonal gammopathy: Secondary | ICD-10-CM

## 2021-06-18 DIAGNOSIS — I69354 Hemiplegia and hemiparesis following cerebral infarction affecting left non-dominant side: Secondary | ICD-10-CM

## 2021-06-18 DIAGNOSIS — D539 Nutritional anemia, unspecified: Secondary | ICD-10-CM | POA: Diagnosis not present

## 2021-06-18 LAB — CBC WITH DIFFERENTIAL/PLATELET
Abs Immature Granulocytes: 0.04 10*3/uL (ref 0.00–0.07)
Basophils Absolute: 0 10*3/uL (ref 0.0–0.1)
Basophils Relative: 1 %
Eosinophils Absolute: 0 10*3/uL (ref 0.0–0.5)
Eosinophils Relative: 1 %
HCT: 28.6 % — ABNORMAL LOW (ref 39.0–52.0)
Hemoglobin: 9.3 g/dL — ABNORMAL LOW (ref 13.0–17.0)
Immature Granulocytes: 1 %
Lymphocytes Relative: 57 %
Lymphs Abs: 1.7 10*3/uL (ref 0.7–4.0)
MCH: 33 pg (ref 26.0–34.0)
MCHC: 32.5 g/dL (ref 30.0–36.0)
MCV: 101.4 fL — ABNORMAL HIGH (ref 80.0–100.0)
Monocytes Absolute: 0.3 10*3/uL (ref 0.1–1.0)
Monocytes Relative: 10 %
Neutro Abs: 0.9 10*3/uL — ABNORMAL LOW (ref 1.7–7.7)
Neutrophils Relative %: 30 %
Platelets: 33 10*3/uL — ABNORMAL LOW (ref 150–400)
RBC: 2.82 MIL/uL — ABNORMAL LOW (ref 4.22–5.81)
RDW: 20 % — ABNORMAL HIGH (ref 11.5–15.5)
Smear Review: NORMAL
WBC: 2.9 10*3/uL — ABNORMAL LOW (ref 4.0–10.5)
nRBC: 0 % (ref 0.0–0.2)

## 2021-06-18 LAB — COMPREHENSIVE METABOLIC PANEL
ALT: 27 U/L (ref 0–44)
AST: 36 U/L (ref 15–41)
Albumin: 3.5 g/dL (ref 3.5–5.0)
Alkaline Phosphatase: 145 U/L — ABNORMAL HIGH (ref 38–126)
Anion gap: 12 (ref 5–15)
BUN: 32 mg/dL — ABNORMAL HIGH (ref 8–23)
CO2: 24 mmol/L (ref 22–32)
Calcium: 10.7 mg/dL — ABNORMAL HIGH (ref 8.9–10.3)
Chloride: 104 mmol/L (ref 98–111)
Creatinine, Ser: 0.87 mg/dL (ref 0.61–1.24)
GFR, Estimated: >60 mL/min (ref >60)
Glucose, Bld: 126 mg/dL — ABNORMAL HIGH (ref 70–99)
Potassium: 3.8 mmol/L (ref 3.5–5.1)
Sodium: 140 mmol/L (ref 135–145)
Total Bilirubin: 0.5 mg/dL (ref 0.3–1.2)
Total Protein: 6.7 g/dL (ref 6.5–8.1)

## 2021-06-18 LAB — SAMPLE TO BLOOD BANK

## 2021-06-18 LAB — VITAMIN D 25 HYDROXY (VIT D DEFICIENCY, FRACTURES): Vit D, 25-Hydroxy: 93.61 ng/mL (ref 30–100)

## 2021-06-18 LAB — URIC ACID: Uric Acid, Serum: 6.9 mg/dL (ref 3.7–8.6)

## 2021-06-18 LAB — ABO/RH: ABO/RH(D): O POS

## 2021-06-18 MED ORDER — PREDNISONE 20 MG PO TABS: 40.0000 mg | ORAL_TABLET | Freq: Every day | ORAL | 1 refills | Status: DC

## 2021-06-18 NOTE — Progress Notes (Signed)
[place] ?OFFICE PROGRESS NOTE ? ?Patient Care Team: ?[name], [name], MD as PCP - General ?Gatha Mayer, MD (Gastroenterology) ?Kristeen Miss, MD as Consulting Physician (Neurosurgery) ? 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[place]) ?I have reviewed his case with the pathologist ?His CT imaging show no evidence of lymphadenopathy ?His pancytopenia is due to diffuse bone marrow infiltration ?High-grade lymphoma, likely diffuse large B-cell lymphoma is in the differential diagnosis ?The patient is weak and debilitated ?He is not sure he wants to pursue treatment ?I recommend stabilizing his disease with prednisone over the weekend ?I will call him next week for further follow-up ?If the patient desire chemotherapy, the usual standard of care would be R-CHOP chemotherapy ?Given his age and comorbidities, I would consider 50% dose reduction for chemotherapy ?Alternatively, he can just pursue single agent rituximab with steroids or prednisone only ?We discussed prognosis without treatment, he will likely succumb to the disease due to severe pancytopenia and bone marrow involvement ? ?Multiple myeloma not having achieved remission ([place]) ?The patient has concurrent diagnosis of multiple myeloma ?I will order additional work-up, results pending ?If the patient decides to pursue treatment for lymphoma such as R-CHOP chemotherapy, that would also treat his myeloma ?As above, I will start him on prednisone therapy ? ?Deficiency anemia ?He has severe pancytopenia due to bone marrow infiltration ?He does not need transfusion support right now ?He is made aware about risk of bleeding ? 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 ?No orders of the defined types were placed in this encounter. ? ? ?All questions were answered. The patient knows to call the clinic with any problems, questions or concerns. ?The total time spent in the appointment was 55 minutes encounter with patients including review of chart and various tests results, discussions about plan of care and coordination of care plan ?  ?Heath Lark, MD ?06/18/2021 3:00 PM ? ?INTERVAL HISTORY: ?Please see below for problem oriented charting. ?he returns for hospital follow-up ?He was seen in the hospital over the weekend when he presented with severe pancytopenia ?CT imaging was unrevealing ?PSA was unremarkable ?He underwent bone marrow biopsy and was discharged ?He returns today with his wife to review test results ?He has profound weakness since discharge from the hospital but gradually improving ?The patient denies any recent signs or symptoms of bleeding such as spontaneous epistaxis, hematuria or hematochezia. ? ?REVIEW OF SYSTEMS:   ?All other systems were reviewed with the patient and are negative. ? ?I have reviewed the past medical history, past surgical history, social history and family history with the patient and they are unchanged from previous note. ? ?ALLERGIES:  has No Known Allergies. ? ?MEDICATIONS:  ?Current Outpatient Medications  ?Medication Sig Dispense Refill  ? predniSONE (DELTASONE) 20 MG tablet Take 2 tablets (40 mg total) by mouth daily with breakfast. 60 tablet 1  ? acetaminophen (TYLENOL) 325 MG tablet Take 650 mg by mouth every 6 (six) hours as needed for moderate pain.    ? atorvastatin (LIPITOR) 40 MG tablet TAKE ONE (1) TABLET BY MOUTH EVERY DAY AT 6PM. (Patient taking differently: Take 20 mg by mouth every other day.) 30 tablet 5  ? B Complex-Folic Acid (B COMPLEX  PLUS) TABS Take 1 tablet by mouth daily. 100 tablet 3  ? Cholecalciferol (VITAMIN D3) 50 MCG (2000 UT) capsule Take 1 capsule (2,000 Units total) by mouth  daily. 100 capsule 3  ? donepezil (ARICEPT) 10 MG tablet Take 1 tablet (10 mg total) by mouth at bedtime. 90 tablet 3  ? megestrol (MEGACE) 40 MG tablet Take 1 tablet (40 mg total) by mouth 2 (two) times daily. 60 tablet 5  ? memantine (NAMENDA) 10 MG tablet TAKE 1 TABLET BY MOUTH TWICE DAILY. 60 tablet 3  ? Misc Natural Products (PROSTATE HEALTH) CAPS Take 1 capsule by mouth in the morning and at bedtime.    ? QUEtiapine (SEROQUEL) 25 MG tablet Take 0.5 tablets (12.5 mg total) by mouth at bedtime. (Patient taking differently: Take 25 mg by mouth at bedtime.) 90 tablet 1  ? tamsulosin (FLOMAX) 0.4 MG CAPS capsule Take 0.4 mg by mouth daily.    ? ?No current facility-administered medications for this visit.  ? ? 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 1. No evidence for primary malignancy or metastatic disease in the chest, abdomen, or pelvis. ?2. Subtle edema/inflammation in the sigmoid mesocolon in the region of an ill-defined diverticulum. Imaging features consistent with sigmoid diverticulitis. No perforation or abscess. ?3. Prostatomegaly with evidence of median lobe hypertrophy generating mass-effect on the inferior bladder. ?4. Nonacute left-sided rib fractures. ?5. 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 ?6. Aortic Atherosclerosis (ICD10-I70.0) and Emphysema (ICD10-J43.9). ?  ?06/14/2021 Bone Marrow Biopsy  ? BONE MARROW, ASPIRATE, CLOT, CORE:  ?-Hypercellular bone marrow with extensive involvement by a B-cell lymphoproliferative disorder  ?-Monoclonal plasma cell population identified  ?-See comment  ? ?PERIPHERAL BLOOD:  ?-Pancytopenia  ? ?COMMENT:  ? ?The bone marrow shows extensive involvement by a high-grade B-cell lymphoproliferative disorder displaying dim surface lambda light chain expression and bright CD45 consistent with involvement by high-grade B-cell lymphoma.  In addition, there is an admixed  monoclonal plasma cell population representing 20% of all cells in the aspirate and displaying kappa light chain restriction consistent with a neoplastic process.  Since the lymphoid and the plasma cell components have different light chain expression, they appear unrelated and may represent two distinct disease processes.  Nonetheless, correlation with cytogenetic and FISH studies is recommended ?  ?06/18/2021 Initial Diagnosis  ? 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  ?06/18/2021 Cancer Staging  ? Staging form: Hodgkin and Non-Hodgkin Lymphoma, AJCC 8th Edition ?- Clinical stage from 06/18/2021: Stage IV (Unknown) - Signed by Heath Lark, MD on 06/18/2021 ?Stage prefix: Initial diagnosis ? ?  ?Multiple myeloma not having achieved remission ([place])  ?06/18/2021 Initial Diagnosis  ? Multiple myeloma not having achieved remission ([place]) ?  ?06/18/2021 Cancer Staging  ? Staging form: Plasma Cell Myeloma and Plasma Cell Disorders, AJCC 8th Edition ?- Clinical stage from 06/18/2021: Albumin (g/dL): 3.1, ISS: Stage II, High-risk cytogenetics: Unknown, LDH: Elevated - Signed by Heath Lark, MD on 06/18/2021 ?Stage prefix: Initial diagnosis ?Albumin range (g/dL): Less than 3.5 ?Cytogenetics: Unknown ? ?  ? ? ?PHYSICAL EXAMINATION: ?ECOG PERFORMANCE STATUS: 2 - Symptomatic, <50% confined to bed ? ?Vitals:  ? 06/18/21 1349  ?BP: 104/75  ?Pulse: 77  ?Resp: 20  ?Temp: 97.7 ?F (36.5 ?C)  ?SpO2: 96%  ? ?Filed Weights  ? 06/18/21 1349  ?Weight: 140 lb (63.5 kg)  ? ? ?GENERAL:alert, no distress and comfortable ?NEURO: alert & oriented x 3 with fluent  speech, with persistent left-sided deficit ? ?LABORATORY DATA:  ?I have reviewed the data as listed ?   ?Component Value Date/Time  ? NA 140 06/18/2021 1322  ? K 3.8 06/18/2021 1322  ? CL 104 06/18/2021 1322  ? CO2 24 06/18/2021 1322  ? GLUCOSE 126 (H) 06/18/2021 1322  ? BUN 32 (H) 06/18/2021 1322  ? CREATININE 0.87 06/18/2021 1322  ? CALCIUM 10.7 (H) 06/18/2021 1322  ? PROT 6.7 06/18/2021 1322  ?  ALBUMIN 3.5 06/18/2021 1322  ? AST 36 06/18/2021 1322  ? ALT 27 06/18/2021 1322  ? ALKPHOS 145 (H) 06/18/2021 1322  ? BILITOT 0.5 06/18/2021 1322  ? GFRNONAA >60 06/18/2021 1322  ? GFRAA >60 04/25/2019 8590

## 2021-06-18 NOTE — Assessment & Plan Note (Signed)
He is taking medications for this ?I recommend deferring physical therapy evaluation right now due to his profound weakness and severe pancytopenia ?

## 2021-06-18 NOTE — Telephone Encounter (Signed)
Noted. Thanks.

## 2021-06-18 NOTE — Assessment & Plan Note (Signed)
He has severe pancytopenia due to bone marrow infiltration ?He does not need transfusion support right now ?He is made aware about risk of bleeding ?

## 2021-06-18 NOTE — Telephone Encounter (Signed)
Rhaj with [facility] calls today in regards to OT orders for patient.  ? ?1 time a week for 8 weeks! ? ?CB: [phone] ?

## 2021-06-18 NOTE — Assessment & Plan Note (Signed)
He has no progression of neurological deficits ?He will continue medical management ?

## 2021-06-18 NOTE — Assessment & Plan Note (Addendum)
The patient has concurrent diagnosis of multiple myeloma ?I will order additional work-up, results pending ?If the patient decides to pursue treatment for lymphoma such as R-CHOP chemotherapy, that would also treat his myeloma ?As above, I will start him on prednisone therapy ?

## 2021-06-18 NOTE — Telephone Encounter (Signed)
Called and spoke with wife. They will be at appt today. Her Husband is getting around better. ?

## 2021-06-18 NOTE — Telephone Encounter (Signed)
Notified doug w/MD response../lmb 

## 2021-06-18 NOTE — Telephone Encounter (Signed)
Okay.  Thanks.

## 2021-06-18 NOTE — Telephone Encounter (Signed)
Can you call wife/family? ?If he cannot come, he might be in worse pancytopenia, then they  should consider taking him to [facility] hospital ?

## 2021-06-18 NOTE — Assessment & Plan Note (Signed)
I have reviewed his case with the pathologist ?His CT imaging show no evidence of lymphadenopathy ?His pancytopenia is due to diffuse bone marrow infiltration ?High-grade lymphoma, likely diffuse large B-cell lymphoma is in the differential diagnosis ?The patient is weak and debilitated ?He is not sure he wants to pursue treatment ?I recommend stabilizing his disease with prednisone over the weekend ?I will call him next week for further follow-up ?If the patient desire chemotherapy, the usual standard of care would be R-CHOP chemotherapy ?Given his age and comorbidities, I would consider 50% dose reduction for chemotherapy ?Alternatively, he can just pursue single agent rituximab with steroids or prednisone only ?We discussed prognosis without treatment, he will likely succumb to the disease due to severe pancytopenia and bone marrow involvement ?

## 2021-06-19 LAB — BETA 2 MICROGLOBULIN, SERUM: Beta-2 Microglobulin: 4.1 mg/L — ABNORMAL HIGH (ref 0.6–2.4)

## 2021-06-21 ENCOUNTER — Telehealth: Payer: Self-pay

## 2021-06-21 ENCOUNTER — Ambulatory Visit: Payer: Medicare HMO | Admitting: Hematology and Oncology

## 2021-06-21 ENCOUNTER — Telehealth: Payer: Self-pay | Admitting: Hematology and Oncology

## 2021-06-21 ENCOUNTER — Other Ambulatory Visit: Payer: Self-pay | Admitting: Hematology and Oncology

## 2021-06-21 ENCOUNTER — Other Ambulatory Visit: Payer: Medicare HMO

## 2021-06-21 DIAGNOSIS — C8510 Unspecified B-cell lymphoma, unspecified site: Secondary | ICD-10-CM

## 2021-06-21 DIAGNOSIS — I700 Atherosclerosis of aorta: Secondary | ICD-10-CM

## 2021-06-21 LAB — MULTIPLE MYELOMA PANEL, SERUM
Albumin SerPl Elph-Mcnc: 3 g/dL (ref 2.9–4.4)
Albumin/Glob SerPl: 1 (ref 0.7–1.7)
Alpha 1: 0.2 g/dL (ref 0.0–0.4)
Alpha2 Glob SerPl Elph-Mcnc: 1.2 g/dL — ABNORMAL HIGH (ref 0.4–1.0)
B-Globulin SerPl Elph-Mcnc: 0.9 g/dL (ref 0.7–1.3)
Gamma Glob SerPl Elph-Mcnc: 0.9 g/dL (ref 0.4–1.8)
Globulin, Total: 3.2 g/dL (ref 2.2–3.9)
IgA: 44 mg/dL — ABNORMAL LOW (ref 61–437)
IgG (Immunoglobin G), Serum: 937 mg/dL (ref 603–1613)
IgM (Immunoglobulin M), Srm: 8 mg/dL — ABNORMAL LOW (ref 15–143)
M Protein SerPl Elph-Mcnc: 0.5 g/dL — ABNORMAL HIGH
Total Protein ELP: 6.2 g/dL (ref 6.0–8.5)

## 2021-06-21 LAB — KAPPA/LAMBDA LIGHT CHAINS
Kappa free light chain: 267.7 mg/L — ABNORMAL HIGH (ref 3.3–19.4)
Kappa, lambda light chain ratio: 16.84 — ABNORMAL HIGH (ref 0.26–1.65)
Lambda free light chains: 15.9 mg/L (ref 5.7–26.3)

## 2021-06-21 NOTE — Progress Notes (Signed)

## 2021-06-21 NOTE — Telephone Encounter (Signed)
Called and reviewed upcoming appts with wife. She verbalized understanding. ?

## 2021-06-21 NOTE — Telephone Encounter (Signed)
I spoke with the patient and his wife ?The patient has made informed decision to pursue chemotherapy ?I plan to prescribe R-CHOP chemotherapy with upfront dose adjustment given his age and comorbidities ?I plan to order port, echocardiogram, blood work and see him back on April 24 along with chemo education class ?Hopefully, we will start him on first dose of treatment on April 28 ?

## 2021-06-22 NOTE — Telephone Encounter (Signed)
Okay.  Thanks.

## 2021-06-22 NOTE — Telephone Encounter (Signed)
Notified pt w/MD response.../lmb 

## 2021-06-23 ENCOUNTER — Other Ambulatory Visit: Payer: Self-pay | Admitting: Student

## 2021-06-23 ENCOUNTER — Encounter (HOSPITAL_COMMUNITY): Payer: Self-pay | Admitting: Hematology and Oncology

## 2021-06-23 ENCOUNTER — Ambulatory Visit (INDEPENDENT_AMBULATORY_CARE_PROVIDER_SITE_OTHER): Payer: Medicare HMO | Admitting: Internal Medicine

## 2021-06-23 ENCOUNTER — Encounter: Payer: Self-pay | Admitting: Internal Medicine

## 2021-06-23 ENCOUNTER — Encounter: Payer: Self-pay | Admitting: Hematology and Oncology

## 2021-06-23 DIAGNOSIS — C8510 Unspecified B-cell lymphoma, unspecified site: Secondary | ICD-10-CM

## 2021-06-23 DIAGNOSIS — D539 Nutritional anemia, unspecified: Secondary | ICD-10-CM | POA: Diagnosis not present

## 2021-06-23 DIAGNOSIS — I10 Essential (primary) hypertension: Secondary | ICD-10-CM | POA: Diagnosis not present

## 2021-06-23 DIAGNOSIS — F03918 Unspecified dementia, unspecified severity, with other behavioral disturbance: Secondary | ICD-10-CM

## 2021-06-23 MED ORDER — QUETIAPINE FUMARATE 25 MG PO TABS: 12.5000 mg | ORAL_TABLET | Freq: Every evening | ORAL | 1 refills | Status: DC

## 2021-06-23 NOTE — Assessment & Plan Note (Signed)
Cont on Seroquel at hs ?

## 2021-06-23 NOTE — Assessment & Plan Note (Signed)
I reviewed Dr Alvy Bimler note: ? 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[place]) ?I have reviewed his case with the pathologist ?His CT imaging show no evidence of lymphadenopathy ?His pancytopenia is due to diffuse bone marrow infiltration ?High-grade lymphoma, likely diffuse large B-cell lymphoma is in the differential diagnosis ?The patient is weak and debilitated ?He is not sure he wants to pursue treatment ?I recommend stabilizing his disease with prednisone over the weekend ?I will call him next week for further follow-up ?If the patient desire chemotherapy, the usual standard of care would be R-CHOP chemotherapy ?Given his age and comorbidities, I would consider 50% dose reduction for chemotherapy ?Alternatively, he can just pursue single agent rituximab with steroids or prednisone only ?We discussed prognosis without treatment, he will likely succumb to the disease due to severe pancytopenia and bone marrow involvement" ?Discussed w/pt ?

## 2021-06-23 NOTE — Assessment & Plan Note (Signed)
S/p RBC transfusion x 2 ?Monitoring CBC ?

## 2021-06-23 NOTE — Progress Notes (Signed)
? ?Subjective:  ?Patient ID: Henry [name], male    DOB: 11-20-1940  Age: 81 y.o. MRN: [id number] ? ?CC: Follow-up (From ED) ? ? ?HPI ?Henry [name] presents for post-hosp f/u for MDS, anemia, h/o CVA. He is here w/his wife... On Prednisone now ?F/u on dementia ? ?Per hx: ?"Admit date: 06/11/2021  Discharge date: 06/15/2021 ?  ?Admitted From: Home   Disposition:  Home ?  ?  ?Recommendations for Outpatient Follow-up:  ?  ?Follow up with PCP in 1-2 weeks ?  ?PCP Please obtain BMP/CBC, 2 view CXR in 1week,  (see Discharge instructions)  ?  ?PCP Please follow up on the following pending results: May benefit from outpatient palliative care/hospice.  Not a good candidate for chemotherapy. ?  ?  ?Home Health: PT,OT   ?Equipment/Devices: as below  ?Consultations: Oncology, IR ?Discharge Condition: Guarded ?CODE STATUS: Full    ?Diet Recommendation: Heart Healthy  ?  ?Diet Order   ?  ?         ?    Diet - low sodium heart healthy       ?  ?    Diet Heart Room service appropriate? Yes; Fluid consistency: Thin  Diet effective now       ?  ?  ?   ?  ?  ?   ?  ?  ?   ?Chief Complaint  ?Patient presents with  ? Anemia  ?  ?  ?Brief history of present illness from the day of admission and additional interim summary   ?  ?81 y.o. male with medical history significant for GERD, osteoarthritis and polycythemia as well as CVA, who presented to the ER with acute onset of generalized weakness and fatigue with no energy.  Found to have pancytopenia with severe symptomatic anemia with hemoglobin of 6 and admitted to the hospital.  No frank bleeding or melena. ?  ?                                                               Hospital Course  ?  ?Symptomatic anemia in a patient with pancytopenia - he does have previous history of polycythemia, no recent infection or medication changes, no bleeding or melena, he has received 2 units of packed RBC so far last on 06/12/2021, initial smear review by technician is unremarkable however his LDH was  somewhat elevated, CT chest abdomen pelvis nonacute except for mildly enlarged prostate and some old asymptomatic L2 and left sacrum fracture.  PSA levels relatively stable.  He underwent bone marrow biopsy by IR on 06/14/2021, bone scan per hematology outpatient, he is currently feeling better will be discharged home.  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  Will follow closely outpatient with PCP and oncology, request PCP to encourage having palliative care discussions.  Bone marrow biopsy results are pending as is bone scan he will follow with oncology outpatient for final results and plan. ?  ?Dyslipidemia - We will continue statin therapy. ?  ?BPH - We will continue Flomax.  Outpatient urology follow-up. ?  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 ?  ?Severe generalized weakness and deconditioning.  Due to #1 above.  Address as in #1 above, PT OT.  Will require home health PT. ?  ?Dyslipidemia.  Started on statin. ?  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  Almost resolved weakness, antiplatelet therapy held due to severe pancytopenia, statin continued for secondary prevention.  PT OT at home will continue.. ?  ?Mild underlying dementia at risk for delirium/encephalopathy.  Supportive care.  Continue memantine and Seroquel combination. ?  ?Incidental finding of left sacral insufficiency fracture along with L2 compression fracture.  These are chronic and asymptomatic.  Outpatient PCP guided monitoring. ?  ?  ?Discharge diagnosis   ?  ?  ?Principal Problem: ?  Pancytopenia ([place]) ?Active Problems: ?  Symptomatic anemia ?  BPH (benign prostatic hyperplasia) ?  Dyslipidemia ?  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  ? I reviewed Dr Alvy Bimler note: ? ?"High grade B-cell lymphoma  ([place]) ?I have reviewed his case with the pathologist ?His CT imaging show no evidence of lymphadenopathy ?His pancytopenia is due to diffuse bone marrow infiltration ?High-grade lymphoma, likely diffuse large B-cell lymphoma is in the differential diagnosis ?The patient is weak and debilitated ?He is not sure he wants to pursue treatment ?I recommend stabilizing his disease with prednisone over the weekend ?I will call him next week for further follow-up ?If the patient desire chemotherapy, the usual standard of care would be R-CHOP chemotherapy ?Given his age and comorbidities, I would consider 50% dose reduction for chemotherapy ?Alternatively, he can just pursue single agent rituximab with steroids or prednisone only ?We discussed prognosis without treatment, he will likely succumb to the disease due to severe pancytopenia and bone marrow involvement" ? ?Outpatient Medications Prior to Visit  ?Medication Sig Dispense Refill  ? acetaminophen (TYLENOL) 325 MG tablet Take 650 mg by mouth every 6 (six) hours as needed for moderate pain.    ? atorvastatin (LIPITOR) 40 MG tablet TAKE ONE (1) TABLET BY MOUTH EVERY DAY AT 6PM. (Patient taking differently: Take 20 mg by mouth every other day.) 30 tablet 5  ? B Complex-Folic Acid (B COMPLEX PLUS) TABS Take 1 tablet by mouth daily. 100 tablet 3  ? Cholecalciferol (VITAMIN D3) 50 MCG (2000 UT) capsule Take 1 capsule (2,000 Units total) by mouth daily. 100 capsule 3  ? donepezil (ARICEPT) 10 MG tablet Take 1 tablet (10 mg total) by mouth at bedtime. 90 tablet 3  ? megestrol (MEGACE) 40 MG tablet Take 1 tablet (40 mg total) by mouth 2 (two) times daily. 60 tablet 5  ? memantine (NAMENDA) 10 MG tablet TAKE 1 TABLET BY MOUTH TWICE DAILY. 60 tablet 3  ? Misc Natural Products (PROSTATE HEALTH) CAPS Take 1 capsule by mouth in the morning and at bedtime.    ? predniSONE (DELTASONE) 20 MG tablet Take 2 tablets (40 mg total) by mouth daily with breakfast. 60 tablet 1  ? tamsulosin  (FLOMAX) 0.4 MG CAPS capsule Take 0.4 mg by mouth daily.    ? QUEtiapine (SEROQUEL) 25 MG tablet Take 0.5 tablets (12.5 mg total) by mouth at bedtime. (Patient taking differently: Take 25 mg by mouth at bedtime.) 90 tablet 1  ? ?No facility-administered medications prior to visit.  ? ? ?ROS: ?Review of Systems  ?Constitutional:  Positive for fatigue. Negative for appetite change and unexpected weight change.  ?HENT:  Negative for congestion, nosebleeds, sneezing, sore throat and trouble swallowing.   ?Eyes:  Negative for  itching and visual disturbance.  ?Respiratory:  Positive for shortness of breath. Negative for cough.   ?Cardiovascular:  Negative for chest pain, palpitations and leg swelling.  ?Gastrointestinal:  Negative for abdominal distention, blood in stool, diarrhea and nausea.  ?Genitourinary:  Negative for frequency and hematuria.  ?Musculoskeletal:  Positive for arthralgias and gait problem. Negative for back pain, joint swelling and neck pain.  ?Skin:  Positive for color change. Negative for rash.  ?Neurological:  Positive for weakness. Negative for dizziness, tremors and speech difficulty.  ?Hematological:  Bruises/bleeds easily.  ?Psychiatric/Behavioral:  Positive for behavioral problems, decreased concentration and dysphoric mood. Negative for agitation, sleep disturbance and suicidal ideas.   ? ?Objective:  ?BP 118/72 (BP Location: Right Arm, Patient Position: Sitting, Cuff Size: Large)   Pulse 85   Temp 98.3 ?F (36.8 ?C) (Oral)   Ht 5' 9" (1.753 m)   Wt 140 lb (63.5 kg)   SpO2 97%   BMI 20.67 kg/m?  ? ?BP Readings from Last 3 Encounters:  ?06/23/21 118/72  ?06/18/21 104/75  ?06/15/21 103/72  ? ? ?Wt Readings from Last 3 Encounters:  ?06/23/21 140 lb (63.5 kg)  ?06/18/21 140 lb (63.5 kg)  ?05/20/21 145 lb 8 oz (66 kg)  ? ? ?Physical Exam ?Constitutional:   ?   General: He is not in acute distress. ?   Appearance: Normal appearance. He is well-developed.  ?   Comments: NAD  ?Eyes:  ?    Conjunctiva/sclera: Conjunctivae normal.  ?   Pupils: Pupils are equal, round, and reactive to light.  ?Neck:  ?   Thyroid: No thyromegaly.  ?   Vascular: No JVD.  ?Cardiovascular:  ?   Rate and Rhythm: Normal rate and

## 2021-06-23 NOTE — Assessment & Plan Note (Signed)
BP Readings from Last 3 Encounters:  ?06/23/21 118/72  ?06/18/21 104/75  ?06/15/21 103/72  ? ? ?

## 2021-06-23 NOTE — Patient Instructions (Signed)
Blue-Emu cream -- use 2-3 times a day ? ?

## 2021-06-24 ENCOUNTER — Ambulatory Visit (HOSPITAL_COMMUNITY)
Admission: RE | Admit: 2021-06-24 | Discharge: 2021-06-24 | Disposition: A | Discharge status: Home / Self Care | Payer: Medicare HMO | Source: Ambulatory Visit | Attending: Hematology and Oncology | Admitting: Hematology and Oncology

## 2021-06-24 ENCOUNTER — Encounter (HOSPITAL_COMMUNITY): Payer: Self-pay

## 2021-06-24 ENCOUNTER — Encounter (HOSPITAL_COMMUNITY): Payer: Self-pay | Admitting: Hematology and Oncology

## 2021-06-24 DIAGNOSIS — C8510 Unspecified B-cell lymphoma, unspecified site: Secondary | ICD-10-CM | POA: Diagnosis not present

## 2021-06-24 DIAGNOSIS — I700 Atherosclerosis of aorta: Secondary | ICD-10-CM | POA: Diagnosis not present

## 2021-06-24 DIAGNOSIS — F03A0 Unspecified dementia, mild, without behavioral disturbance, psychotic disturbance, mood disturbance, and anxiety: Secondary | ICD-10-CM | POA: Insufficient documentation

## 2021-06-24 DIAGNOSIS — C8330 Diffuse large B-cell lymphoma, unspecified site: Secondary | ICD-10-CM | POA: Diagnosis not present

## 2021-06-24 DIAGNOSIS — K219 Gastro-esophageal reflux disease without esophagitis: Secondary | ICD-10-CM | POA: Diagnosis not present

## 2021-06-24 DIAGNOSIS — E785 Hyperlipidemia, unspecified: Secondary | ICD-10-CM | POA: Insufficient documentation

## 2021-06-24 DIAGNOSIS — C9000 Multiple myeloma not having achieved remission: Secondary | ICD-10-CM | POA: Diagnosis not present

## 2021-06-24 DIAGNOSIS — Z452 Encounter for adjustment and management of vascular access device: Secondary | ICD-10-CM | POA: Diagnosis not present

## 2021-06-24 HISTORY — PX: IR IMAGING GUIDED PORT INSERTION: IMG5740

## 2021-06-24 MED ORDER — FENTANYL CITRATE (PF) 100 MCG/2ML IJ SOLN
INTRAMUSCULAR | Status: AC | PRN
Start: 2021-06-24 — End: 2021-06-24
  Administered 2021-06-24: 50 ug via INTRAVENOUS

## 2021-06-24 MED ORDER — HEPARIN SOD (PORK) LOCK FLUSH 100 UNIT/ML IV SOLN
INTRAVENOUS | Status: AC
  Filled 2021-06-24: qty 5

## 2021-06-24 MED ORDER — MIDAZOLAM HCL 2 MG/2ML IJ SOLN
INTRAMUSCULAR | Status: AC
Start: 2021-06-24 — End: 2021-06-24
  Filled 2021-06-24: qty 2

## 2021-06-24 MED ORDER — LIDOCAINE HCL 1 % IJ SOLN
INTRAMUSCULAR | Status: AC
Start: 2021-06-24 — End: 2021-06-24
  Filled 2021-06-24: qty 20

## 2021-06-24 MED ORDER — LIDOCAINE-EPINEPHRINE 1 %-1:100000 IJ SOLN
INTRAMUSCULAR | Status: AC
  Filled 2021-06-24: qty 1

## 2021-06-24 MED ORDER — HEPARIN SOD (PORK) LOCK FLUSH 100 UNIT/ML IV SOLN
INTRAVENOUS | Status: AC | PRN
  Administered 2021-06-24: 500 [IU] via INTRAVENOUS

## 2021-06-24 MED ORDER — MIDAZOLAM HCL 2 MG/2ML IJ SOLN
INTRAMUSCULAR | Status: AC | PRN
  Administered 2021-06-24: 1 mg via INTRAVENOUS

## 2021-06-24 MED ORDER — FENTANYL CITRATE (PF) 100 MCG/2ML IJ SOLN
INTRAMUSCULAR | Status: AC
  Filled 2021-06-24: qty 2

## 2021-06-24 MED ORDER — SODIUM CHLORIDE 0.9 % IV SOLN
INTRAVENOUS | Status: DC
Start: 2021-06-24 — End: 2021-06-25

## 2021-06-24 NOTE — Discharge Instructions (Signed)

## 2021-06-24 NOTE — H&P (Signed)
? ? ?Referring Physician(s): ?Gorsuch,Ni ? ?Supervising Physician: Markus Daft ? ?Patient Status:  [facility] ? ?Chief Complaint: ? ?"I'm here for a port a cath" ? ?Subjective: ?Pt known to interventional radiology from cerebral arteriogram with right MCA revascularization in 2021 and bone marrow biopsy on 06/14/2021.  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  He presents today for Port-A-Cath placement to assist with treatment. He currently denies fever,HA,CP,dyspnea, cough, abd /back pain, N/V or bleeding. He does have some residual left sided weakness, primarily LUE.  ? ? ? ?Past Medical History:  ?Diagnosis Date  ? Colon polyps   ? COVID-19   ? Elevated MCV 2011  ? GERD (gastroesophageal reflux disease) 2012  ? LBP (low back pain)   ? 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 Osteoarthritis   ? Pneumonia   ? Polycythemia 2011  ? Stroke [facility])   ? ?Past Surgical History:  ?Procedure Laterality Date  ? APPENDECTOMY    ? COLONOSCOPY    ? HERNIA REPAIR    ? march 2019  ? INGUINAL HERNIA REPAIR Left 2015  ? IR CT HEAD LTD  04/17/2019  ? IR PERCUTANEOUS ART THROMBECTOMY/INFUSION INTRACRANIAL INC DIAG ANGIO  04/17/2019  ? LUMBAR LAMINECTOMY/DECOMPRESSION MICRODISCECTOMY N/A 05/20/2016  ? Procedure: Lumbar Three-Four,[place] Four-Five,Lumbar Five-Sacral One Laminectomy/Foraminotomy;  Surgeon: Kristeen Miss, MD;  Location: [place];  Service: Neurosurgery;  Laterality: N/A;  ? LUMBAR LAMINECTOMY/DECOMPRESSION MICRODISCECTOMY Left 03/22/2021  ? Procedure: Left Lumbar three-four Laminectomy and foraminotomy;  Surgeon: Kristeen Miss, MD;  Location: [place];  Service: Neurosurgery;  Laterality: Left;  ? POLYPECTOMY    ? RADIOLOGY WITH ANESTHESIA N/A 04/17/2019  ?  Procedure: IR WITH ANESTHESIA;  Surgeon: Luanne Bras, MD;  Location: [place];  Service: Radiology;  Laterality: N/A;  ? ? ? ? ?Allergies: ?Patient has no known allergies. ? ?Medications: ?Prior to Admission medications   ?Medication Sig Start Date End Date Taking? Authorizing Provider  ?acetaminophen (TYLENOL) 325 MG tablet Take 650 mg by mouth every 6 (six) hours as needed for moderate pain.    [provider]  ?atorvastatin (LIPITOR) 40 MG tablet TAKE ONE (1) TABLET BY MOUTH EVERY DAY AT 6PM. ?Patient taking differently: Take 20 mg by mouth every other day. 11/16/20   [name], [name], MD  ?B Complex-Folic Acid (B COMPLEX PLUS) TABS Take 1 tablet by mouth daily. 08/08/19   [name], [name], MD  ?Cholecalciferol (VITAMIN D3) 50 MCG (2000 UT) capsule Take 1 capsule (2,000 Units total) by mouth daily. 05/29/19   [name], [name], MD  ?donepezil (ARICEPT) 10 MG tablet Take 1 tablet (10 mg total) by mouth at bedtime. 01/07/21   Frann Rider, NP  ?megestrol (MEGACE) 40 MG tablet Take 1 tablet (40 mg total) by mouth 2 (two) times daily. 05/20/21   [name], [name], MD  ?memantine (NAMENDA) 10 MG tablet TAKE 1 TABLET BY MOUTH TWICE DAILY. 05/31/21   [name], [name], MD  ?Misc Natural Products (PROSTATE HEALTH) CAPS Take 1 capsule by mouth in the morning and at bedtime.    [provider]  ?predniSONE (DELTASONE) 20 MG tablet Take 2 tablets (40 mg total) by mouth daily with breakfast. 06/18/21   Heath Lark, MD  ?QUEtiapine (SEROQUEL) 25 MG tablet Take 0.5 tablets (12.5 mg total)  by mouth at bedtime. 06/23/21   [name], [name], MD  ?tamsulosin (FLOMAX) 0.4 MG CAPS capsule Take 0.4 mg by mouth daily. 05/24/21   [provider]  ? ? ? ?Vital Signs:pending ? ? ?Physical Exam pt awake, oriented to person, birthdate, location but not year, day; chest- CTA bilat; heart- RRR; abd- soft,+BS,NT; no LE edema; decreased grip strength/diminished mobility LUE ? ?Imaging: ?No results  found. ? ?Labs: ? ?CBC: ?Recent Labs  ?  06/14/21 ?0155 06/14/21 ?1037 06/15/21 ?5638 06/18/21 ?1322  ?WBC 3.2* 3.1* 2.6* 2.9*  ?HGB 9.0* 9.3* 8.9* 9.3*  ?HCT 27.9* 27.1* 26.4* 28.6*  ?PLT 28* 25* 26* 33*  ? ? ?COAGS: ?No results for input(s): INR, APTT in the last 8760 hours. ? ?BMP: ?Recent Labs  ?  06/13/21 ?0244 06/14/21 ?0155 06/15/21 ?7564 06/18/21 ?1322  ?NA 140 141 142 140  ?K 3.6 3.9 3.6 3.8  ?CL 107 108 110 104  ?CO2 21* 20* 23 24  ?GLUCOSE 89 87 135* 126*  ?BUN '18 18 18 ' 32*  ?CALCIUM 9.5 9.4 9.5 10.7*  ?CREATININE 0.97 0.91 0.73 0.87  ?GFRNONAA >60 >60 >60 >60  ? ? ?LIVER FUNCTION TESTS: ?Recent Labs  ?  06/13/21 ?0244 06/14/21 ?0155 06/15/21 ?3329 06/18/21 ?1322  ?BILITOT 0.8 0.7 0.5 0.5  ?AST 35 35 29 36  ?ALT '21 21 20 27  ' ?ALKPHOS 113 110 114 145*  ?PROT 5.5* 5.6* 5.2* 6.7  ?ALBUMIN 2.6* 2.6* 2.4* 3.5  ? ? ?Assessment and Plan: ?Pt known to interventional radiology from cerebral arteriogram with right MCA revascularization in 2021 and bone marrow biopsy on 06/14/2021.  Past medical history significant for GERD, anemia, polycythemia, HLD, BPH, osteoarthritis, prior stroke with some left-sided weakness, pancytopenia,?  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  He presents today for Port-A-Cath placement to assist with treatment.Risks and benefits of image guided port-a-catheter placement was discussed with the patient/spouse  including, but not limited to bleeding, infection, pneumothorax, or fibrin sheath development and need for additional procedures. ? ?All of the patient's questions were answered, patient is agreeable to proceed. ?Consent signed and in chart. ? ? ? ?Electronically Signed: ?Autumn Messing, PA-C ?06/24/2021, 9:22 AM ? ? ?I spent a total of  25 minutes at the the patient's bedside AND on the patient's hospital floor or unit, greater than 50% of which was counseling/coordinating  care for port  a cath placement ? ? ? ? ? ?

## 2021-06-24 NOTE — Procedures (Signed)
Interventional Radiology Procedure: ? ? ?Indications: High grade B-cell lymphoma ? ?Procedure: Port placement ? ?Findings: Right jugular port, tip at SVC/RA junction ? ?Complications: None ?    ?EBL: Minimal, less than 10 ml ? ?Plan: Discharge in one hour.  Keep port site and incisions dry for at least 24 hours.   ? ? ?Kelten Enochs R. Anselm Pancoast, MD  ?Pager: [phone] ? ?  ?

## 2021-06-25 NOTE — Progress Notes (Signed)
Pharmacist Chemotherapy Monitoring - Initial Assessment   ? ?Anticipated start date: 07/02/21  ? ?The following has been reviewed per standard work regarding the patient's treatment regimen: ?The patient's diagnosis, treatment plan and drug doses, and organ/hematologic function ?Lab orders and baseline tests specific to treatment regimen  ?The treatment plan start date, drug sequencing, and pre-medications ?Prior authorization status  ?Patient's documented medication list, including drug-drug interaction screen and prescriptions for anti-emetics and supportive care specific to the treatment regimen ?The drug concentrations, fluid compatibility, administration routes, and timing of the medications to be used ?The patient's access for treatment and lifetime cumulative dose history, if applicable  ?The patient's medication allergies and previous infusion related reactions, if applicable  ? ?Changes made to treatment plan:  ?treatment plan date ? ?Follow up needed:  ?Pending authorization for treatment  and prescriptions needed for anti-emetics ?ECHO pending. ? ? ?Kennith Center, Pharm.D., CPP ?06/25/2021'@1'$ :42 PM ? ? ? ?

## 2021-06-28 ENCOUNTER — Inpatient Hospital Stay: Payer: Medicare HMO

## 2021-06-28 ENCOUNTER — Telehealth: Payer: Self-pay | Admitting: *Deleted

## 2021-06-28 ENCOUNTER — Encounter: Payer: Self-pay | Admitting: Hematology and Oncology

## 2021-06-28 ENCOUNTER — Inpatient Hospital Stay: Payer: Medicare HMO | Admitting: Hematology and Oncology

## 2021-06-28 ENCOUNTER — Other Ambulatory Visit: Payer: Self-pay

## 2021-06-28 ENCOUNTER — Other Ambulatory Visit: Payer: Self-pay | Admitting: Hematology and Oncology

## 2021-06-28 VITALS — BP 114/68 | HR 119 | Temp 98.0°F | Resp 18 | Ht 69.0 in | Wt 139.0 lb

## 2021-06-28 DIAGNOSIS — Z7189 Other specified counseling: Secondary | ICD-10-CM | POA: Insufficient documentation

## 2021-06-28 DIAGNOSIS — C8510 Unspecified B-cell lymphoma, unspecified site: Secondary | ICD-10-CM

## 2021-06-28 DIAGNOSIS — D539 Nutritional anemia, unspecified: Secondary | ICD-10-CM

## 2021-06-28 DIAGNOSIS — K5903 Drug induced constipation: Secondary | ICD-10-CM

## 2021-06-28 DIAGNOSIS — Z5112 Encounter for antineoplastic immunotherapy: Secondary | ICD-10-CM | POA: Diagnosis not present

## 2021-06-28 DIAGNOSIS — C9000 Multiple myeloma not having achieved remission: Secondary | ICD-10-CM

## 2021-06-28 DIAGNOSIS — Z5111 Encounter for antineoplastic chemotherapy: Secondary | ICD-10-CM | POA: Diagnosis not present

## 2021-06-28 LAB — CBC WITH DIFFERENTIAL (CANCER CENTER ONLY)
Abs Immature Granulocytes: 0.11 10*3/uL — ABNORMAL HIGH (ref 0.00–0.07)
Basophils Absolute: 0 10*3/uL (ref 0.0–0.1)
Basophils Relative: 0 %
Eosinophils Absolute: 0 10*3/uL (ref 0.0–0.5)
Eosinophils Relative: 0 %
HCT: 27.1 % — ABNORMAL LOW (ref 39.0–52.0)
Hemoglobin: 8.9 g/dL — ABNORMAL LOW (ref 13.0–17.0)
Immature Granulocytes: 3 %
Lymphocytes Relative: 30 %
Lymphs Abs: 1.1 10*3/uL (ref 0.7–4.0)
MCH: 33.5 pg (ref 26.0–34.0)
MCHC: 32.8 g/dL (ref 30.0–36.0)
MCV: 101.9 fL — ABNORMAL HIGH (ref 80.0–100.0)
Monocytes Absolute: 0.2 10*3/uL (ref 0.1–1.0)
Monocytes Relative: 6 %
Neutro Abs: 2.3 10*3/uL (ref 1.7–7.7)
Neutrophils Relative %: 61 %
Platelet Count: 33 10*3/uL — ABNORMAL LOW (ref 150–400)
RBC: 2.66 MIL/uL — ABNORMAL LOW (ref 4.22–5.81)
RDW: 20.8 % — ABNORMAL HIGH (ref 11.5–15.5)
WBC Count: 3.7 10*3/uL — ABNORMAL LOW (ref 4.0–10.5)
nRBC: 0 % (ref 0.0–0.2)

## 2021-06-28 LAB — CMP (CANCER CENTER ONLY)
ALT: 85 U/L — ABNORMAL HIGH (ref 0–44)
AST: 28 U/L (ref 15–41)
Albumin: 3.6 g/dL (ref 3.5–5.0)
Alkaline Phosphatase: 124 U/L (ref 38–126)
Anion gap: 13 (ref 5–15)
BUN: 32 mg/dL — ABNORMAL HIGH (ref 8–23)
CO2: 24 mmol/L (ref 22–32)
Calcium: 9.2 mg/dL (ref 8.9–10.3)
Chloride: 103 mmol/L (ref 98–111)
Creatinine: 1.02 mg/dL (ref 0.61–1.24)
GFR, Estimated: >60 mL/min (ref >60)
Glucose, Bld: 190 mg/dL — ABNORMAL HIGH (ref 70–99)
Potassium: 4 mmol/L (ref 3.5–5.1)
Sodium: 140 mmol/L (ref 135–145)
Total Bilirubin: 0.7 mg/dL (ref 0.3–1.2)
Total Protein: 6.3 g/dL — ABNORMAL LOW (ref 6.5–8.1)

## 2021-06-28 LAB — URIC ACID: Uric Acid, Serum: 6.4 mg/dL (ref 3.7–8.6)

## 2021-06-28 LAB — SAMPLE TO BLOOD BANK

## 2021-06-28 LAB — LACTATE DEHYDROGENASE: LDH: 235 U/L — ABNORMAL HIGH (ref 98–192)

## 2021-06-28 MED ORDER — LIDOCAINE-PRILOCAINE 2.5-2.5 % EX CREA: TOPICAL_CREAM | CUTANEOUS | 3 refills | Status: DC

## 2021-06-28 MED ORDER — ONDANSETRON HCL 8 MG PO TABS: 8.0000 mg | ORAL_TABLET | Freq: Two times a day (BID) | ORAL | 1 refills | Status: DC | PRN

## 2021-06-28 MED ORDER — PROCHLORPERAZINE MALEATE 10 MG PO TABS: 10.0000 mg | ORAL_TABLET | Freq: Four times a day (QID) | ORAL | 6 refills | Status: DC | PRN

## 2021-06-28 MED ORDER — ALLOPURINOL 300 MG PO TABS: 300.0000 mg | ORAL_TABLET | Freq: Every day | ORAL | 0 refills | Status: DC

## 2021-06-28 MED ORDER — POLYETHYLENE GLYCOL 3350 17 G PO PACK: 17.0000 g | PACK | Freq: Every day | ORAL | 1 refills | Status: DC

## 2021-06-28 MED ORDER — ACYCLOVIR 400 MG PO TABS: 400.0000 mg | ORAL_TABLET | Freq: Every day | ORAL | 6 refills | Status: AC

## 2021-06-28 NOTE — Progress Notes (Signed)
[place] ?OFFICE PROGRESS NOTE ? ?Patient Care Team: ?[name], [name], MD as PCP - General ?Gatha Mayer, MD (Gastroenterology) ?Kristeen Miss, MD as Consulting Physician (Neurosurgery) ? 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[place]) ?I have reviewed plan of care with the patient and his wife ?The patient desire chemotherapy even though he is aware that his risk is high due to his age, prior history of stroke, severe pancytopenia and others ?We will proceed with treatment as scheduled at the end of the week ?He will get pretreatment echocardiogram in 2 days ?I plan upfront dose adjustment for all his chemotherapy except keeping rituximab at same dose ?He will continue 40 mg of prednisone daily until his appointment to see me next week ?He is aware that he needs regular weekly follow-up with me for me to check his blood ?He is aware that he will likely need aggressive transfusion support while on treatment ?I recommend allopurinol for tumor lysis prophylaxis ?I recommend acyclovir for antimicrobial prophylaxis ? ?Multiple myeloma not having achieved remission ([place]) ?His recent myeloma showed slightly elevated M protein ?Observe only ? 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 ?Goals of care, counseling/discussion ?We had multiple goals of care discussions ?The patient is aware about his prognosis with or without treatment ?He is aware about excessive risk with treatment due to his comorbidities, his age and pancytopenia and he would like to proceed with treatment ? ?Constipation ?He has history of chronic constipation ?We discussed importance of laxative therapy ? ?No orders of the defined types were placed in  this encounter. ? ? ?All questions were answered. The patient knows to call the clinic with any problems, questions or concerns. ?The total time spent in the appointment was 40 minutes encounter with patients including review of chart and various tests results, discussions about plan of care and coordination of care plan ?  ?Heath Lark, MD ?06/28/2021 1:44 PM ? ?INTERVAL HISTORY: ?Please see below for problem oriented charting. ?he returns for with his wife ?His wife noticed that he has better energy and appetite since we started him on prednisone daily ?No recent bleeding ?He has chronic constipation, stable ? ?REVIEW OF SYSTEMS:   ?Constitutional: Denies fevers, chills or abnormal weight loss ?Eyes: Denies blurriness of vision ?Ears, nose, mouth, throat, and face: Denies mucositis or sore throat ?Respiratory: Denies cough, dyspnea or wheezes ?Cardiovascular: Denies palpitation, chest discomfort or lower extremity swelling ?Skin: Denies abnormal skin rashes ?Lymphatics: Denies new lymphadenopathy or easy bruising ?Neurological:Denies numbness, tingling or new weaknesses ?Behavioral/Psych: Mood is stable, no new changes  ?All other systems were reviewed with the patient and are negative. ? ?I have reviewed the past medical history, past surgical history, social history and family history with the patient and they are unchanged from previous note. ? ?ALLERGIES:  has No Known Allergies. ? ?MEDICATIONS:  ?Current Outpatient Medications  ?Medication Sig Dispense Refill  ? acyclovir (ZOVIRAX) 400 MG tablet Take 1 tablet (400 mg total) by mouth daily. 30 tablet 6  ? polyethylene glycol (MIRALAX) 17 g packet Take 17 g by mouth daily. 30 each 1  ? acetaminophen (TYLENOL) 325 MG tablet Take 650 mg by mouth every 6 (six)  hours as needed for moderate pain.    ? allopurinol (ZYLOPRIM) 300 MG tablet Take 1 tablet (300 mg total) by mouth daily. 30 tablet 0  ? atorvastatin (LIPITOR) 40 MG tablet TAKE ONE (1) TABLET BY MOUTH EVERY  DAY AT 6PM. (Patient taking differently: Take 20 mg by mouth every other day.) 30 tablet 5  ? B Complex-Folic Acid (B COMPLEX PLUS) TABS Take 1 tablet by mouth daily. 100 tablet 3  ? Cholecalciferol (VITAMIN D3) 50 MCG (2000 UT) capsule Take 1 capsule (2,000 Units total) by mouth daily. 100 capsule 3  ? donepezil (ARICEPT) 10 MG tablet Take 1 tablet (10 mg total) by mouth at bedtime. 90 tablet 3  ? lidocaine-prilocaine (EMLA) cream Apply to affected area once 30 g 3  ? megestrol (MEGACE) 40 MG tablet Take 1 tablet (40 mg total) by mouth 2 (two) times daily. 60 tablet 5  ? memantine (NAMENDA) 10 MG tablet TAKE 1 TABLET BY MOUTH TWICE DAILY. 60 tablet 3  ? Misc Natural Products (PROSTATE HEALTH) CAPS Take 1 capsule by mouth in the morning and at bedtime.    ? ondansetron (ZOFRAN) 8 MG tablet Take 1 tablet (8 mg total) by mouth 2 (two) times daily as needed for refractory nausea / vomiting. Start on day 3 after cyclophosphamide chemotherapy. 30 tablet 1  ? predniSONE (DELTASONE) 20 MG tablet Take 2 tablets (40 mg total) by mouth daily with breakfast. 60 tablet 1  ? prochlorperazine (COMPAZINE) 10 MG tablet Take 1 tablet (10 mg total) by mouth every 6 (six) hours as needed (Nausea or vomiting). 30 tablet 6  ? QUEtiapine (SEROQUEL) 25 MG tablet Take 0.5 tablets (12.5 mg total) by mouth at bedtime. 90 tablet 1  ? tamsulosin (FLOMAX) 0.4 MG CAPS capsule Take 0.4 mg by mouth daily.    ? ?No current facility-administered medications for this visit.  ? ? 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  ?High grade B-cell lymphoma ([place])  ?06/12/2021 Imaging  ? 1. No evidence for primary malignancy or metastatic disease in the chest, abdomen, or pelvis. ?2. Subtle edema/inflammation in the sigmoid mesocolon in the region of an ill-defined diverticulum. Imaging  features consistent with sigmoid diverticulitis. No perforation or abscess. ?3. Prostatomegaly with evidence of median lobe hypertrophy generating mass-effect on the inferior bladder. ?4. Nonacute left-sided rib fractures. ?5. 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 ?6. Aortic Atherosclerosis (ICD10-I70.0) and Emphysema (ICD10-J43.9). ?  ?06/14/2021 Bone Marrow Biopsy  ? BONE MARROW, ASPIRATE, CLOT, CORE:  ?-Hypercellular bone marrow with extensive involvement by a B-cell lymphoproliferative disorder  ?-Monoclonal plasma cell population identified  ?-See comment  ? ?PERIPHERAL BLOOD:  ?-Pancytopenia  ? ?COMMENT:  ? ?The bone marrow shows extensive involvement by a high-grade B-cell lymphoproliferative disorder displaying dim surface lambda light chain expression and bright CD45 consistent with involvement by high-grade B-cell lymphoma.  In addition, there is an admixed monoclonal plasma cell population representing 20% of all cells in the aspirate and displaying kappa light chain restriction consistent with a neoplastic process.  Since the lymphoid and the plasma cell components have different light chain expression, they appear unrelated and may represent two distinct disease processes.  Nonetheless, correlation with cytogenetic and FISH studies is recommended ?  ?06/18/2021 Initial Diagnosis  ? High grade B-cell lymphoma ([place]) ?  ?06/18/2021 Cancer Staging  ?  Staging form: Hodgkin and Non-Hodgkin Lymphoma, AJCC 8th Edition ?- Clinical stage from 06/18/2021: Stage IV (Unknown) - Signed by Heath Lark, MD on 06/18/2021 ?Stage prefix: Initial diagnosis ? ?  ?06/24/2021 Procedure  ? Placement of a subcutaneous power-injectable port device. Catheter tip at the superior cavoatrial junction. ?  ?07/02/2021 -  Chemotherapy  ? Patient is on Treatment Plan : NON-HODGKINS LYMPHOMA R-CHOP q21d  ? ?  ?  ?Multiple myeloma not having achieved remission ([place])  ?06/18/2021 Initial Diagnosis  ? Multiple  myeloma not having achieved remission ([place]) ? ?  ?06/18/2021 Cancer Staging  ? Staging form: Plasma Cell Myeloma and Plasma Cell Disorders, AJCC 8th Edition ?- Clinical stage from 06/18/2021: Albumin (g/dL): 3.1,

## 2021-06-28 NOTE — Assessment & Plan Note (Signed)
We had multiple goals of care discussions ?The patient is aware about his prognosis with or without treatment ?He is aware about excessive risk with treatment due to his comorbidities, his age and pancytopenia and he would like to proceed with treatment ?

## 2021-06-28 NOTE — Telephone Encounter (Signed)
Rec'd PA deternination from insurance for med Quetiapine Fumarate 25 mg.. MED has been approved until 03/06/22.Marland KitchenJohny Chess ? ?  ?

## 2021-06-28 NOTE — Assessment & Plan Note (Signed)
He has history of chronic constipation ?We discussed importance of laxative therapy ?

## 2021-06-28 NOTE — Assessment & Plan Note (Signed)
His recent myeloma showed slightly elevated M protein ?Observe only ?

## 2021-06-28 NOTE — Assessment & Plan Note (Signed)
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

## 2021-06-28 NOTE — Assessment & Plan Note (Signed)
I have reviewed plan of care with the patient and his wife ?The patient desire chemotherapy even though he is aware that his risk is high due to his age, prior history of stroke, severe pancytopenia and others ?We will proceed with treatment as scheduled at the end of the week ?He will get pretreatment echocardiogram in 2 days ?I plan upfront dose adjustment for all his chemotherapy except keeping rituximab at same dose ?He will continue 40 mg of prednisone daily until his appointment to see me next week ?He is aware that he needs regular weekly follow-up with me for me to check his blood ?He is aware that he will likely need aggressive transfusion support while on treatment ?I recommend allopurinol for tumor lysis prophylaxis ?I recommend acyclovir for antimicrobial prophylaxis ?

## 2021-06-29 ENCOUNTER — Encounter: Payer: Medicare HMO | Admitting: Physical Medicine & Rehabilitation

## 2021-06-29 ENCOUNTER — Telehealth: Payer: Self-pay

## 2021-06-29 NOTE — Telephone Encounter (Signed)
Called and left a message that lab appts have been changed to port flush appts. Left dates and times of appts. Ask her to call the office back for questions. ?

## 2021-06-30 ENCOUNTER — Ambulatory Visit (HOSPITAL_COMMUNITY)
Admission: RE | Admit: 2021-06-30 | Discharge: 2021-06-30 | Disposition: A | Discharge status: Home / Self Care | Payer: Medicare HMO | Source: Ambulatory Visit | Attending: Hematology and Oncology | Admitting: Hematology and Oncology

## 2021-06-30 DIAGNOSIS — I080 Rheumatic disorders of both mitral and aortic valves: Secondary | ICD-10-CM | POA: Insufficient documentation

## 2021-06-30 DIAGNOSIS — C8510 Unspecified B-cell lymphoma, unspecified site: Secondary | ICD-10-CM | POA: Insufficient documentation

## 2021-06-30 DIAGNOSIS — I700 Atherosclerosis of aorta: Secondary | ICD-10-CM | POA: Insufficient documentation

## 2021-06-30 DIAGNOSIS — Z0189 Encounter for other specified special examinations: Secondary | ICD-10-CM | POA: Diagnosis not present

## 2021-06-30 LAB — ECHOCARDIOGRAM COMPLETE
AR max vel: 2.3 cm2
AV Area VTI: 2.4 cm2
AV Area mean vel: 2.14 cm2
AV Mean grad: 5 mmHg
AV Peak grad: 8.3 mmHg
Ao pk vel: 1.44 m/s
Area-P 1/2: 3.65 cm2
Calc EF: 50.9 %
S' Lateral: 3.4 cm
Single Plane A2C EF: 47.7 %
Single Plane A4C EF: 50.1 %

## 2021-06-30 LAB — HEPATITIS B CORE ANTIBODY, TOTAL

## 2021-06-30 LAB — HEPATITIS B SURFACE ANTIGEN

## 2021-07-01 ENCOUNTER — Encounter: Payer: Self-pay | Admitting: Hematology and Oncology

## 2021-07-01 DIAGNOSIS — M4807 Spinal stenosis, lumbosacral region: Secondary | ICD-10-CM | POA: Diagnosis not present

## 2021-07-01 DIAGNOSIS — E785 Hyperlipidemia, unspecified: Secondary | ICD-10-CM

## 2021-07-01 DIAGNOSIS — D751 Secondary polycythemia: Secondary | ICD-10-CM | POA: Diagnosis not present

## 2021-07-01 DIAGNOSIS — K5730 Diverticulosis of large intestine without perforation or abscess without bleeding: Secondary | ICD-10-CM

## 2021-07-01 DIAGNOSIS — Z9181 History of falling: Secondary | ICD-10-CM

## 2021-07-01 DIAGNOSIS — I69354 Hemiplegia and hemiparesis following cerebral infarction affecting left non-dominant side: Secondary | ICD-10-CM | POA: Diagnosis not present

## 2021-07-01 DIAGNOSIS — Z791 Long term (current) use of non-steroidal anti-inflammatories (NSAID): Secondary | ICD-10-CM

## 2021-07-01 DIAGNOSIS — N400 Enlarged prostate without lower urinary tract symptoms: Secondary | ICD-10-CM

## 2021-07-01 DIAGNOSIS — M48061 Spinal stenosis, lumbar region without neurogenic claudication: Secondary | ICD-10-CM | POA: Diagnosis not present

## 2021-07-01 DIAGNOSIS — Z8701 Personal history of pneumonia (recurrent): Secondary | ICD-10-CM

## 2021-07-01 DIAGNOSIS — J438 Other emphysema: Secondary | ICD-10-CM

## 2021-07-01 DIAGNOSIS — Z86010 Personal history of colonic polyps: Secondary | ICD-10-CM

## 2021-07-01 DIAGNOSIS — D649 Anemia, unspecified: Secondary | ICD-10-CM | POA: Diagnosis not present

## 2021-07-01 DIAGNOSIS — I700 Atherosclerosis of aorta: Secondary | ICD-10-CM

## 2021-07-01 DIAGNOSIS — Z7982 Long term (current) use of aspirin: Secondary | ICD-10-CM

## 2021-07-01 DIAGNOSIS — K219 Gastro-esophageal reflux disease without esophagitis: Secondary | ICD-10-CM

## 2021-07-01 DIAGNOSIS — F03A18 Unspecified dementia, mild, with other behavioral disturbance: Secondary | ICD-10-CM | POA: Diagnosis not present

## 2021-07-01 DIAGNOSIS — J8410 Pulmonary fibrosis, unspecified: Secondary | ICD-10-CM

## 2021-07-01 DIAGNOSIS — D696 Thrombocytopenia, unspecified: Secondary | ICD-10-CM

## 2021-07-01 DIAGNOSIS — Z87891 Personal history of nicotine dependence: Secondary | ICD-10-CM

## 2021-07-01 DIAGNOSIS — M1990 Unspecified osteoarthritis, unspecified site: Secondary | ICD-10-CM

## 2021-07-01 DIAGNOSIS — N281 Cyst of kidney, acquired: Secondary | ICD-10-CM

## 2021-07-01 DIAGNOSIS — Z8616 Personal history of COVID-19: Secondary | ICD-10-CM

## 2021-07-01 DIAGNOSIS — D61818 Other pancytopenia: Secondary | ICD-10-CM | POA: Diagnosis not present

## 2021-07-01 DIAGNOSIS — J432 Centrilobular emphysema: Secondary | ICD-10-CM | POA: Diagnosis not present

## 2021-07-01 DIAGNOSIS — I2510 Atherosclerotic heart disease of native coronary artery without angina pectoris: Secondary | ICD-10-CM | POA: Diagnosis not present

## 2021-07-01 NOTE — Progress Notes (Signed)
Called pt to introduce myself as his Financial Resource Specialist and to discuss the [employer] grant.  Unfortunately there aren't any foundations offering copay assistance for his Dx and the type of ins he has.  I left a msg requesting he return my call if he's interested in applying for the grant. °

## 2021-07-02 ENCOUNTER — Inpatient Hospital Stay: Payer: Medicare HMO

## 2021-07-02 ENCOUNTER — Inpatient Hospital Stay: Payer: Medicare HMO | Admitting: Dietician

## 2021-07-02 ENCOUNTER — Other Ambulatory Visit: Payer: Self-pay

## 2021-07-02 VITALS — BP 85/55 | HR 92 | Temp 98.3°F | Resp 17 | Wt 132.8 lb

## 2021-07-02 DIAGNOSIS — Z5111 Encounter for antineoplastic chemotherapy: Secondary | ICD-10-CM | POA: Diagnosis not present

## 2021-07-02 DIAGNOSIS — C8510 Unspecified B-cell lymphoma, unspecified site: Secondary | ICD-10-CM | POA: Diagnosis not present

## 2021-07-02 DIAGNOSIS — Z5112 Encounter for antineoplastic immunotherapy: Secondary | ICD-10-CM | POA: Diagnosis not present

## 2021-07-02 DIAGNOSIS — C9000 Multiple myeloma not having achieved remission: Secondary | ICD-10-CM | POA: Diagnosis not present

## 2021-07-02 MED ORDER — PROCHLORPERAZINE MALEATE 10 MG PO TABS
10.0000 mg | ORAL_TABLET | Freq: Once | ORAL | Status: AC
  Administered 2021-07-02: 10 mg via ORAL
  Filled 2021-07-02: qty 1

## 2021-07-02 MED ORDER — SODIUM CHLORIDE 0.9 % IV SOLN
375.0000 mg/m2 | Freq: Once | INTRAVENOUS | Status: AC
  Administered 2021-07-02: 660 mg via INTRAVENOUS
  Filled 2021-07-02: qty 33

## 2021-07-02 MED ORDER — SODIUM CHLORIDE 0.9 % IV SOLN: Freq: Once | INTRAVENOUS | Status: AC

## 2021-07-02 MED ORDER — SODIUM CHLORIDE 0.9 % IV SOLN
150.0000 mg | Freq: Once | INTRAVENOUS | Status: AC
  Administered 2021-07-02: 150 mg via INTRAVENOUS
  Filled 2021-07-02: qty 150

## 2021-07-02 MED ORDER — DIPHENHYDRAMINE HCL 25 MG PO CAPS
25.0000 mg | ORAL_CAPSULE | Freq: Once | ORAL | Status: AC
  Administered 2021-07-02: 25 mg via ORAL
  Filled 2021-07-02: qty 1

## 2021-07-02 MED ORDER — SODIUM CHLORIDE 0.9% FLUSH
10.0000 mL | INTRAVENOUS | Status: DC | PRN
  Administered 2021-07-02: 10 mL

## 2021-07-02 MED ORDER — HEPARIN SOD (PORK) LOCK FLUSH 100 UNIT/ML IV SOLN
500.0000 [IU] | Freq: Once | INTRAVENOUS | Status: AC | PRN
  Administered 2021-07-02: 500 [IU]

## 2021-07-02 MED ORDER — SODIUM CHLORIDE 0.9 % IV SOLN
375.0000 mg/m2 | Freq: Once | INTRAVENOUS | Status: AC
  Administered 2021-07-02: 700 mg via INTRAVENOUS
  Filled 2021-07-02: qty 50

## 2021-07-02 MED ORDER — VINCRISTINE SULFATE CHEMO INJECTION 1 MG/ML
2.0000 mg | Freq: Once | INTRAVENOUS | Status: AC
  Administered 2021-07-02: 2 mg via INTRAVENOUS
  Filled 2021-07-02: qty 2

## 2021-07-02 MED ORDER — SODIUM CHLORIDE 0.9 % IV SOLN
10.0000 mg | Freq: Once | INTRAVENOUS | Status: AC
  Administered 2021-07-02: 10 mg via INTRAVENOUS
  Filled 2021-07-02: qty 10

## 2021-07-02 MED ORDER — ACETAMINOPHEN 325 MG PO TABS
650.0000 mg | ORAL_TABLET | Freq: Once | ORAL | Status: AC
  Administered 2021-07-02: 650 mg via ORAL
  Filled 2021-07-02: qty 2

## 2021-07-02 MED ORDER — DOXORUBICIN HCL CHEMO IV INJECTION 2 MG/ML
25.0000 mg/m2 | Freq: Once | INTRAVENOUS | Status: AC
  Administered 2021-07-02: 44 mg via INTRAVENOUS
  Filled 2021-07-02: qty 22

## 2021-07-02 MED ORDER — PALONOSETRON HCL INJECTION 0.25 MG/5ML
0.2500 mg | Freq: Once | INTRAVENOUS | Status: AC
  Administered 2021-07-02: 0.25 mg via INTRAVENOUS
  Filled 2021-07-02: qty 5

## 2021-07-02 NOTE — Patient Instructions (Signed)
[facility]  Discharge Instructions: ?Thank you for choosing [facility] to provide your oncology and hematology care.  ? ?If you have a lab appointment with the [facility], please go directly to the [facility] and check in at the registration area. ?  ?Wear comfortable clothing and clothing appropriate for easy access to any Portacath or PICC line.  ? ?We strive to give you quality time with your provider. You may need to reschedule your appointment if you arrive late (15 or more minutes).  Arriving late affects you and other patients whose appointments are after yours.  Also, if you miss three or more appointments without notifying the office, you may be dismissed from the clinic at the provider?s discretion.    ?  ?For prescription refill requests, have your pharmacy contact our office and allow 72 hours for refills to be completed.   ? ?Today you received the following chemotherapy and/or immunotherapy agents: Adriamycin, Vincristine, Cytoxan, and Rituxan    ?  ?To help prevent nausea and vomiting after your treatment, we encourage you to take your nausea medication as directed. ? ?BELOW ARE SYMPTOMS THAT SHOULD BE REPORTED IMMEDIATELY: ?*FEVER GREATER THAN 100.4 F (38 ?C) OR HIGHER ?*CHILLS OR SWEATING ?*NAUSEA AND VOMITING THAT IS NOT CONTROLLED WITH YOUR NAUSEA MEDICATION ?*UNUSUAL SHORTNESS OF BREATH ?*UNUSUAL BRUISING OR BLEEDING ?*URINARY PROBLEMS (pain or burning when urinating, or frequent urination) ?*BOWEL PROBLEMS (unusual diarrhea, constipation, pain near the anus) ?TENDERNESS IN MOUTH AND THROAT WITH OR WITHOUT PRESENCE OF ULCERS (sore throat, sores in mouth, or a toothache) ?UNUSUAL RASH, SWELLING OR PAIN  ?UNUSUAL VAGINAL DISCHARGE OR ITCHING  ? ?Items with * indicate a potential emergency and should be followed up as soon as possible or go to the Emergency Department if any problems should occur. ? ?Please show the CHEMOTHERAPY ALERT CARD or  IMMUNOTHERAPY ALERT CARD at check-in to the Emergency Department and triage nurse. ? ?Should you have questions after your visit or need to cancel or reschedule your appointment, please contact [facility]  Dept: [phone]  and follow the prompts.  Office hours are 8:00 a.m. to 4:30 p.m. Monday - Friday. Please note that voicemails left after 4:00 p.m. may not be returned until the following business day.  We are closed weekends and major holidays. You have access to a nurse at all times for urgent questions. Please call the main number to the clinic Dept: [phone] and follow the prompts. ? ? ?For any non-urgent questions, you may also contact your provider using MyChart. We now offer e-Visits for anyone 90 and older to request care online for non-urgent symptoms. For details visit [email or web address]. ?  ?Also download the MyChart app! Go to the app store, search "MyChart", open the app, select [facility], and log in with your MyChart username and password. ? ?Due to Covid, a mask is required upon entering the hospital/clinic. If you do not have a mask, one will be given to you upon arrival. For doctor visits, patients may have 1 support person aged 46 or older with them. For treatment visits, patients cannot have anyone with them due to current Covid guidelines and our immunocompromised population.  ? ?Doxorubicin injection ?What is this medication? ?DOXORUBICIN (dox oh ROO bi sin) is a chemotherapy drug. It is used to treat many kinds of cancer like leukemia, lymphoma, neuroblastoma, sarcoma, and Wilms' tumor. It is also used to treat bladder cancer, breast cancer, lung cancer,  ovarian cancer, stomach cancer, and thyroid cancer. ?This medicine may be used for other purposes; ask your health care provider or pharmacist if you have questions. ?COMMON BRAND NAME(S): Adriamycin, Adriamycin PFS, Adriamycin RDF, Rubex ?What should I tell my care team before I take this  medication? ?They need to know if you have any of these conditions: ?heart disease ?history of low blood counts caused by a medicine ?liver disease ?recent or ongoing radiation therapy ?an unusual or allergic reaction to doxorubicin, other chemotherapy agents, other medicines, foods, dyes, or preservatives ?pregnant or trying to get pregnant ?breast-feeding ?How should I use this medication? ?This drug is given as an infusion into a vein. It is administered in a hospital or clinic by a specially trained health care professional. If you have pain, swelling, burning or any unusual feeling around the site of your injection, tell your health care professional right away. ?Talk to your pediatrician regarding the use of this medicine in children. Special care may be needed. ?Overdosage: If you think you have taken too much of this medicine contact a poison control center or emergency room at once. ?NOTE: This medicine is only for you. Do not share this medicine with others. ?What if I miss a dose? ?It is important not to miss your dose. Call your doctor or health care professional if you are unable to keep an appointment. ?What may interact with this medication? ?This medicine may interact with the following medications: ?6-mercaptopurine ?paclitaxel ?phenytoin ?St. John's Wort ?trastuzumab ?verapamil ?This list may not describe all possible interactions. Give your health care provider a list of all the medicines, herbs, non-prescription drugs, or dietary supplements you use. Also tell them if you smoke, drink alcohol, or use illegal drugs. Some items may interact with your medicine. ?What should I watch for while using this medication? ?This drug may make you feel generally unwell. This is not uncommon, as chemotherapy can affect healthy cells as well as cancer cells. Report any side effects. Continue your course of treatment even though you feel ill unless your doctor tells you to stop. ?There is a maximum amount of  this medicine you should receive throughout your life. The amount depends on the medical condition being treated and your overall health. Your doctor will watch how much of this medicine you receive in your lifetime. Tell your doctor if you have taken this medicine before. ?You may need blood work done while you are taking this medicine. ?Your urine may turn red for a few days after your dose. This is not blood. If your urine is dark or brown, call your doctor. ?In some cases, you may be given additional medicines to help with side effects. Follow all directions for their use. ?Call your doctor or health care professional for advice if you get a fever, chills or sore throat, or other symptoms of a cold or flu. Do not treat yourself. This drug decreases your body's ability to fight infections. Try to avoid being around people who are sick. ?This medicine may increase your risk to bruise or bleed. Call your doctor or health care professional if you notice any unusual bleeding. ?Talk to your doctor about your risk of cancer. You may be more at risk for certain types of cancers if you take this medicine. ?Do not become pregnant while taking this medicine or for 6 months after stopping it. Women should inform their doctor if they wish to become pregnant or think they might be pregnant. Men should not father a  child while taking this medicine and for 6 months after stopping it. There is a potential for serious side effects to an unborn child. Talk to your health care professional or pharmacist for more information. Do not breast-feed an infant while taking this medicine. ?This medicine has caused ovarian failure in some women and reduced sperm counts in some men This medicine may interfere with the ability to have a child. Talk with your doctor or health care professional if you are concerned about your fertility. ?This medicine may cause a decrease in Co-Enzyme Q-10. You should make sure that you get enough Co-Enzyme  Q-10 while you are taking this medicine. Discuss the foods you eat and the vitamins you take with your health care professional. ?What side effects may I notice from receiving this medication? ?Side effects that you sh

## 2021-07-02 NOTE — Progress Notes (Signed)
Patient reports some mild nausea with infusion of Rituxan. Dr Burr Medico notified. Awaiting orders (1149am) ?

## 2021-07-02 NOTE — Progress Notes (Signed)
Nutrition Assessment ? ? ?Reason for Assessment: weight loss  ? ? 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 Patient is receiving reduced dose R-CHOP q21d. He is under the care of [facility].   ? ?Past medical history includes HTN, DVT, carotid stenosis, GERD, dementia with behavioral disturbances, left hemiparesis s/p stroke, osteoarthritis, BPH ? ?Met with patient in infusion. He reports appetite has improved and "eating everything I can get my hands on recently." Patient recalls eating a bowl of cheerios for breakfast. He had meatloaf, potatoes/gravy, beans for dinner. Patient is drinking at least one Ensure. He is agreeable to having an ensure with peanut butter crackers during infusion. Patient drinks ~16 ounces of water in addition to what he drinks when taking medications. He "loves" Dr. Malachi Bonds as this gets him going in the mornings. Patient denies constipation, reports taking Miralax every morning.  ? ? ?Nutrition Focused Physical Exam:  ? ?Orbital Region: severe ?Buccal Region: severe ?Upper Arm Region: severe ?Temple Region: moderate ?Clavicle Bone Region: moderate ?Shoulder and Acromion Bone Region: moderate ?Dorsal Hand: severe  ?Eyes: reviewed ?Skin: reviewed ?Nails: reviewed  ? ? ?Medications: Miralax, acyclovir, zofran, compazine, seroquel, prednisone, namenda, megace, aricept, lipitor, B-complex plus folic acid, D3 ? ? ?Labs: 4/24 - Hgb 8.9, glucose 190, BUN 32 ? ? ?Anthropometrics: Weights have decreased 5.7% (8 lbs) in 2 weeks. On 4/14 pt weighed 140 lbs. This is significant ? ?Height: 5'9" ?Weight: 132 lb 12 oz ?UBW: 160 lb 12.8 oz (02/03/21) ?BMI: 19.60 ? ? ?MALNUTRITION DIAGNOSIS: Severe malnutrition related to chronic illness as evidenced by severe fat, moderate/severe muscle depletion, 17.5% (28 lb) decrease from usual weight in the last 5 months; significant ? ? ?INTERVENTION:  ?Educated on high calorie, high protein diet to  promote weight gain/strength - handout provided ?Encouraged high protein snacks in between meals and at bedtime - handout with ideas provided  ?Continue drinking Ensure Plus/equivalent, recommend 2-3 daily - coupons provided ?Continue miralax daily per MD ?Contact information provided  ? ? ?MONITORING, EVALUATION, GOAL: Patient will tolerate increased calories and protein to promote weight gain  ? ? ?Next Visit: Friday May 19 during infusion  ? ? ? ? ? ?

## 2021-07-02 NOTE — Progress Notes (Signed)
Patient tolerated IV admin of adriamycin well.  Blood return noted before during and after infusion.  Pt given ice pop for oral therapy and had to be constantly reminded why he needed it.  Pt stated "may be easier with the ice" ?

## 2021-07-02 NOTE — Progress Notes (Signed)
Patient tolerating treatment well.  Sleeping off/on with no complaints at this time ?

## 2021-07-02 NOTE — Progress Notes (Signed)
Per Dr. Alvy Bimler, patient has chonic pancytopenia, ok to proceed with treatment with labs from 06/28/2021 ?

## 2021-07-05 ENCOUNTER — Inpatient Hospital Stay: Payer: Medicare HMO

## 2021-07-05 ENCOUNTER — Inpatient Hospital Stay: Payer: Medicare HMO | Attending: Hematology and Oncology | Admitting: Hematology and Oncology

## 2021-07-05 ENCOUNTER — Other Ambulatory Visit: Payer: Medicare HMO

## 2021-07-05 ENCOUNTER — Telehealth: Payer: Self-pay | Admitting: *Deleted

## 2021-07-05 ENCOUNTER — Telehealth: Payer: Self-pay

## 2021-07-05 ENCOUNTER — Encounter: Payer: Self-pay | Admitting: Hematology and Oncology

## 2021-07-05 ENCOUNTER — Other Ambulatory Visit: Payer: Self-pay

## 2021-07-05 ENCOUNTER — Ambulatory Visit: Payer: Medicare HMO

## 2021-07-05 VITALS — BP 112/71 | HR 66 | Temp 98.7°F | Resp 18

## 2021-07-05 VITALS — BP 91/61 | HR 110 | Temp 97.6°F | Resp 16 | Ht 69.0 in | Wt 134.0 lb

## 2021-07-05 DIAGNOSIS — C8510 Unspecified B-cell lymphoma, unspecified site: Secondary | ICD-10-CM | POA: Insufficient documentation

## 2021-07-05 DIAGNOSIS — D539 Nutritional anemia, unspecified: Secondary | ICD-10-CM | POA: Insufficient documentation

## 2021-07-05 DIAGNOSIS — K5903 Drug induced constipation: Secondary | ICD-10-CM

## 2021-07-05 DIAGNOSIS — C9000 Multiple myeloma not having achieved remission: Secondary | ICD-10-CM | POA: Insufficient documentation

## 2021-07-05 DIAGNOSIS — F03918 Unspecified dementia, unspecified severity, with other behavioral disturbance: Secondary | ICD-10-CM

## 2021-07-05 DIAGNOSIS — I69354 Hemiplegia and hemiparesis following cerebral infarction affecting left non-dominant side: Secondary | ICD-10-CM | POA: Diagnosis not present

## 2021-07-05 DIAGNOSIS — D61818 Other pancytopenia: Secondary | ICD-10-CM | POA: Insufficient documentation

## 2021-07-05 DIAGNOSIS — Z5111 Encounter for antineoplastic chemotherapy: Secondary | ICD-10-CM | POA: Insufficient documentation

## 2021-07-05 DIAGNOSIS — Z5189 Encounter for other specified aftercare: Secondary | ICD-10-CM | POA: Insufficient documentation

## 2021-07-05 LAB — CBC WITH DIFFERENTIAL (CANCER CENTER ONLY)
Abs Immature Granulocytes: 0.02 10*3/uL (ref 0.00–0.07)
Basophils Absolute: 0 10*3/uL (ref 0.0–0.1)
Basophils Relative: 0 %
Eosinophils Absolute: 0 10*3/uL (ref 0.0–0.5)
Eosinophils Relative: 0 %
HCT: 20.4 % — ABNORMAL LOW (ref 39.0–52.0)
Hemoglobin: 6.7 g/dL — CL (ref 13.0–17.0)
Immature Granulocytes: 1 %
Lymphocytes Relative: 50 %
Lymphs Abs: 1.2 10*3/uL (ref 0.7–4.0)
MCH: 33.2 pg (ref 26.0–34.0)
MCHC: 32.8 g/dL (ref 30.0–36.0)
MCV: 101 fL — ABNORMAL HIGH (ref 80.0–100.0)
Monocytes Absolute: 0.1 10*3/uL (ref 0.1–1.0)
Monocytes Relative: 2 %
Neutro Abs: 1.2 10*3/uL — ABNORMAL LOW (ref 1.7–7.7)
Neutrophils Relative %: 47 %
Platelet Count: 19 10*3/uL — ABNORMAL LOW (ref 150–400)
RBC: 2.02 MIL/uL — ABNORMAL LOW (ref 4.22–5.81)
RDW: 20.1 % — ABNORMAL HIGH (ref 11.5–15.5)
WBC Count: 2.5 10*3/uL — ABNORMAL LOW (ref 4.0–10.5)
nRBC: 0 % (ref 0.0–0.2)

## 2021-07-05 LAB — CMP (CANCER CENTER ONLY)
ALT: 40 U/L (ref 0–44)
AST: 19 U/L (ref 15–41)
Albumin: 3.2 g/dL — ABNORMAL LOW (ref 3.5–5.0)
Alkaline Phosphatase: 58 U/L (ref 38–126)
Anion gap: 3 — ABNORMAL LOW (ref 5–15)
BUN: 27 mg/dL — ABNORMAL HIGH (ref 8–23)
CO2: 28 mmol/L (ref 22–32)
Calcium: 8.5 mg/dL — ABNORMAL LOW (ref 8.9–10.3)
Chloride: 108 mmol/L (ref 98–111)
Creatinine: 0.79 mg/dL (ref 0.61–1.24)
GFR, Estimated: >60 mL/min (ref >60)
Glucose, Bld: 126 mg/dL — ABNORMAL HIGH (ref 70–99)
Potassium: 3.7 mmol/L (ref 3.5–5.1)
Sodium: 139 mmol/L (ref 135–145)
Total Bilirubin: 0.6 mg/dL (ref 0.3–1.2)
Total Protein: 5.2 g/dL — ABNORMAL LOW (ref 6.5–8.1)

## 2021-07-05 LAB — LACTATE DEHYDROGENASE: LDH: 231 U/L — ABNORMAL HIGH (ref 98–192)

## 2021-07-05 LAB — URIC ACID: Uric Acid, Serum: 4.3 mg/dL (ref 3.7–8.6)

## 2021-07-05 LAB — SAMPLE TO BLOOD BANK

## 2021-07-05 LAB — PREPARE RBC (CROSSMATCH)

## 2021-07-05 MED ORDER — PREDNISONE 20 MG PO TABS: 10.0000 mg | ORAL_TABLET | Freq: Every day | ORAL | 1 refills | Status: DC

## 2021-07-05 MED ORDER — FUROSEMIDE 10 MG/ML IJ SOLN
20.0000 mg | Freq: Once | INTRAMUSCULAR | Status: AC
  Administered 2021-07-05: 20 mg via INTRAVENOUS

## 2021-07-05 MED ORDER — PEGFILGRASTIM-CBQV 6 MG/0.6ML ~~LOC~~ SOSY
6.0000 mg | PREFILLED_SYRINGE | Freq: Once | SUBCUTANEOUS | Status: AC
  Administered 2021-07-05: 6 mg via SUBCUTANEOUS

## 2021-07-05 MED ORDER — SODIUM CHLORIDE 0.9% IV SOLUTION
250.0000 mL | Freq: Once | INTRAVENOUS | Status: AC
  Administered 2021-07-05: 250 mL via INTRAVENOUS

## 2021-07-05 MED ORDER — HEPARIN SOD (PORK) LOCK FLUSH 100 UNIT/ML IV SOLN
500.0000 [IU] | Freq: Once | INTRAVENOUS | Status: AC
  Administered 2021-07-05: 500 [IU]

## 2021-07-05 MED ORDER — DIPHENHYDRAMINE HCL 25 MG PO CAPS
25.0000 mg | ORAL_CAPSULE | Freq: Once | ORAL | Status: AC
  Administered 2021-07-05: 25 mg via ORAL

## 2021-07-05 MED ORDER — SODIUM CHLORIDE 0.9% FLUSH
10.0000 mL | Freq: Once | INTRAVENOUS | Status: AC
  Administered 2021-07-05: 10 mL

## 2021-07-05 MED ORDER — ACETAMINOPHEN 325 MG PO TABS
650.0000 mg | ORAL_TABLET | Freq: Once | ORAL | Status: AC
  Administered 2021-07-05: 650 mg via ORAL

## 2021-07-05 NOTE — Telephone Encounter (Signed)
-----   Message from Dionne Ano, RN sent at 07/02/2021  2:23 PM EDT ----- ?Regarding: Follow up: Dr. Alvy Bimler, 1st time RCHOP 4/29 ? ? ?

## 2021-07-05 NOTE — Assessment & Plan Note (Signed)
The cause of the pancytopenia is related to his bone marrow disease and side effects of treatment ?He is not symptomatic except for fatigue ?We discussed some of the risks, benefits, and alternatives of blood transfusions. The patient is symptomatic from anemia and the hemoglobin level is critically low.  Some of the side-effects to be expected including risks of transfusion reactions, chills, infection, syndrome of volume overload and risk of hospitalization from various reasons and the patient is willing to proceed and went ahead to sign consent today. ?I recommend 2 units of blood today ?We will reassess next week ?He does not need platelet transfusion today ?

## 2021-07-05 NOTE — Progress Notes (Signed)
[facility] ?OFFICE PROGRESS NOTE ? ?Patient Care Team: ?[name], [name], MD as PCP - General ?Gatha Mayer, MD (Gastroenterology) ?Kristeen Miss, MD as Consulting Physician (Neurosurgery) ? 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 ?Deficiency anemia ?The cause of the pancytopenia is related to his bone marrow disease and side effects of treatment ?He is not symptomatic except for fatigue ?We discussed some of the risks, benefits, and alternatives of blood transfusions. The patient is symptomatic from anemia and the hemoglobin level is critically low.  Some of the side-effects to be expected including risks of transfusion reactions, chills, infection, syndrome of volume overload and risk of hospitalization from various reasons and the patient is willing to proceed and went ahead to sign consent today. ?I recommend 2 units of blood today ?We will reassess next week ?He does not need platelet transfusion today ? ?Hemiparesis affecting left side as late effect of stroke ([place]) ?He has no worsening neurological deficits ?Continue close monitoring ? ?Dementia with behavioral disturbance ([place]) ?He has no signs of worsening dementia while on treatment ?Monitor closely ? ?Constipation ?He has history of chronic constipation ?I recommend continue on aggressive laxative therapy ? ?Orders Placed This Encounter  ?Procedures  ? Care order/instruction  ?  Transfuse Parameters  ?  Standing Status:   Future  ?  Standing Expiration Date:   07/05/2022  ? Informed Consent Details: Physician/Practitioner Attestation; Transcribe to consent form and obtain patient signature  ?  Standing Status:   Future  ?  Standing Expiration Date:   07/06/2022  ?  Order  Specific Question:   Physician/Practitioner attestation of informed consent for blood and or blood product transfusion  ?  Answer:   I, the physician/practitioner, attest that I have discussed with the patient the benefits, risks, side effects, alternatives, likelihood of achieving goals and potential problems during recovery for the procedure that I have provided informed consent.  ?  Order Specific Question:   Product(s)  ?  Answer:   All Product(s)  ? Type and screen  ?   ?  ?  Standing Status:   Future  ?  Number of Occurrences:   1  ?  Standing Expiration Date:   07/06/2022  ? ? ?All questions were answered. The patient knows to call the clinic with any problems, questions or concerns. ?The total time spent in the appointment was 40 minutes encounter with patients including review of chart and various tests results, discussions about plan of care and coordination of care plan ?  ?Heath Lark, MD ?07/05/2021 10:21 AM ? ?INTERVAL HISTORY: ?Please see below for problem oriented charting. ?he returns for treatment follow-up with his wife ?He denies side effects from treatment ?He has excellent appetite with high-dose prednisone ?No new neurological deficits ?No recent spontaneous bleeding ?No fever or chills ?He has a little fatigue but denies chest pain or shortness of breath ?He has slight constipation but this is not new ? ?REVIEW OF SYSTEMS:   ?Constitutional: Denies fevers, chills or abnormal weight loss ?Eyes: Denies blurriness of vision ?Ears, nose, mouth, throat, and face: Denies mucositis or sore throat ?Respiratory: Denies cough, dyspnea or wheezes ?Cardiovascular: Denies palpitation, chest discomfort or lower extremity swelling ?Skin: Denies abnormal  skin rashes ?Lymphatics: Denies new lymphadenopathy or easy bruising ?Neurological:Denies numbness, tingling or new weaknesses ?Behavioral/Psych: Mood is stable, no new changes  ?All other systems were reviewed with the patient and are negative. ? ?I have  reviewed the past medical history, past surgical history, social history and family history with the patient and they are unchanged from previous note. ? ?ALLERGIES:  has No Known Allergies. ? ?MEDICATIONS:  ?Current Outpatient Medications  ?Medication Sig Dispense Refill  ? acetaminophen (TYLENOL) 325 MG tablet Take 650 mg by mouth every 6 (six) hours as needed for moderate pain.    ? acyclovir (ZOVIRAX) 400 MG tablet Take 1 tablet (400 mg total) by mouth daily. 30 tablet 6  ? allopurinol (ZYLOPRIM) 300 MG tablet Take 1 tablet (300 mg total) by mouth daily. 30 tablet 0  ? atorvastatin (LIPITOR) 40 MG tablet TAKE ONE (1) TABLET BY MOUTH EVERY DAY AT 6PM. (Patient taking differently: Take 20 mg by mouth every other day.) 30 tablet 5  ? B Complex-Folic Acid (B COMPLEX PLUS) TABS Take 1 tablet by mouth daily. 100 tablet 3  ? Cholecalciferol (VITAMIN D3) 50 MCG (2000 UT) capsule Take 1 capsule (2,000 Units total) by mouth daily. 100 capsule 3  ? donepezil (ARICEPT) 10 MG tablet Take 1 tablet (10 mg total) by mouth at bedtime. 90 tablet 3  ? lidocaine-prilocaine (EMLA) cream Apply to affected area once 30 g 3  ? megestrol (MEGACE) 40 MG tablet Take 1 tablet (40 mg total) by mouth 2 (two) times daily. 60 tablet 5  ? memantine (NAMENDA) 10 MG tablet TAKE 1 TABLET BY MOUTH TWICE DAILY. 60 tablet 3  ? Misc Natural Products (PROSTATE HEALTH) CAPS Take 1 capsule by mouth in the morning and at bedtime.    ? ondansetron (ZOFRAN) 8 MG tablet Take 1 tablet (8 mg total) by mouth 2 (two) times daily as needed for refractory nausea / vomiting. Start on day 3 after cyclophosphamide chemotherapy. 30 tablet 1  ? polyethylene glycol (MIRALAX) 17 g packet Take 17 g by mouth daily. 30 each 1  ? predniSONE (DELTASONE) 20 MG tablet Take 0.5 tablets (10 mg total) by mouth daily with breakfast. 60 tablet 1  ? prochlorperazine (COMPAZINE) 10 MG tablet Take 1 tablet (10 mg total) by mouth every 6 (six) hours as needed (Nausea or vomiting). 30  tablet 6  ? QUEtiapine (SEROQUEL) 25 MG tablet Take 0.5 tablets (12.5 mg total) by mouth at bedtime. 90 tablet 1  ? tamsulosin (FLOMAX) 0.4 MG CAPS capsule Take 0.4 mg by mouth daily.    ? ?No current facility-administered medications for this visit.  ? ?Facility-Administered Medications Ordered in Other Visits  ?Medication Dose Route Frequency Provider Last Rate Last Admin  ? acetaminophen (TYLENOL) tablet 650 mg  650 mg Oral Once Heath Lark, MD      ? diphenhydrAMINE (BENADRYL) capsule 25 mg  25 mg Oral Once Alvy Bimler, Saidee Geremia, MD      ? furosemide (LASIX) injection 20 mg  20 mg Intravenous Once Heath Lark, MD      ? pegfilgrastim-cbqv (UDENYCA) injection 6 mg  6 mg Subcutaneous Once Heath Lark, MD      ? ? 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 1. No evidence for primary malignancy or  metastatic disease in the chest, abdomen, or pelvis. ?2. Subtle edema/inflammation in the sigmoid mesocolon in the region of an ill-defined diverticulum. Imaging features consistent with sigmoid diverticulitis. No perforation or abscess. ?3. Prostatomegaly with evidence of median lobe hypertrophy generating mass-effect on the inferior bladder. ?4. Nonacute left-sided rib fractures. ?5. 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 ?6. Aortic Atherosclerosis (ICD10-I70.0) and Emphysema (ICD10-J43.9). ?  ?06/14/2021 Bone Marrow Biopsy  ? BONE MARROW, ASPIRATE, CLOT, CORE:  ?-Hypercellular bone marrow with extensive involvement by a B-cell lymphoproliferative disorder  ?-Monoclonal plasma cell population identified  ?-See comment  ? ?PERIPHERAL BLOOD:  ?-Pancytopenia  ? ?COMMENT:  ? ?The bone marrow shows extensive involvement by a high-grade B-cell lymphoproliferative disorder  displaying dim surface lambda light chain expression and bright CD45 consistent with involvement by high-grade B-cell lymphoma.  In addition, there is an admixed monoclonal plasma cell population representing 20% of a

## 2021-07-05 NOTE — Telephone Encounter (Signed)
Confirmed transfusion orders with [name] in [facility]. ?Patient scheduled at 10 am today ?

## 2021-07-05 NOTE — Assessment & Plan Note (Signed)
He has history of chronic constipation ?I recommend continue on aggressive laxative therapy ?

## 2021-07-05 NOTE — Assessment & Plan Note (Signed)
He has no worsening neurological deficits ?Continue close monitoring ?

## 2021-07-05 NOTE — Telephone Encounter (Signed)
CRITICAL VALUE STICKER ? ?CRITICAL VALUE: Hgb 6.7 ? ?RECEIVER (on-site recipient of call):Sandi K, RN ? ?DATE & TIME NOTIFIED: 07/05/21; 6349  ? ?MESSENGER (representative from lab):Lauren ? ?MD NOTIFIED: Dr. Alvy Bimler ? ?TIME OF NOTIFICATION:0918 ? ?RESPONSE: Transfusion ordered, scheduled for today ? ?

## 2021-07-05 NOTE — Telephone Encounter (Signed)
Patient saw Dr. Alvy Bimler this am and reviewed notes. He is to receive 2 units of blood for his low hemoglobin of 6.7 today. Pt with known pancytopenia. No side-effects noted from chemotherapy. Did not call the patient for follow up call as he was evaluated by physician earlier today. ?

## 2021-07-05 NOTE — Assessment & Plan Note (Signed)
He denies side effects from recent treatment ?His severe pancytopenia is to be expected ?We will arrange for 2 units of blood transfusion ?I recommend reducing prednisone dose to 10 mg daily and reassess next week ?He will also get G-CSF support today ?

## 2021-07-05 NOTE — Assessment & Plan Note (Signed)
He has no signs of worsening dementia while on treatment ?Monitor closely ?

## 2021-07-06 LAB — TYPE AND SCREEN
ABO/RH(D): O POS
Antibody Screen: NEGATIVE
Unit division: 0
Unit division: 0

## 2021-07-06 LAB — BPAM RBC
Blood Product Expiration Date: 202305262359
Blood Product Expiration Date: 202305282359
ISSUE DATE / TIME: 202305011057
ISSUE DATE / TIME: 202305011057
Unit Type and Rh: 5100
Unit Type and Rh: 5100

## 2021-07-08 ENCOUNTER — Ambulatory Visit: Payer: Medicare HMO | Admitting: Adult Health

## 2021-07-09 ENCOUNTER — Telehealth: Payer: Self-pay | Admitting: Internal Medicine

## 2021-07-09 NOTE — Telephone Encounter (Signed)
OT making provider aware ot was missed today due to pt not feeling well ?

## 2021-07-11 NOTE — Telephone Encounter (Signed)
Noted. Thanks.

## 2021-07-12 ENCOUNTER — Encounter: Payer: Self-pay | Admitting: Hematology and Oncology

## 2021-07-12 ENCOUNTER — Inpatient Hospital Stay: Payer: Medicare HMO

## 2021-07-12 ENCOUNTER — Inpatient Hospital Stay: Payer: Medicare HMO | Admitting: Hematology and Oncology

## 2021-07-12 ENCOUNTER — Other Ambulatory Visit: Payer: Self-pay

## 2021-07-12 VITALS — BP 105/73 | HR 124 | Temp 98.1°F | Resp 18 | Ht 69.0 in | Wt 138.0 lb

## 2021-07-12 DIAGNOSIS — D539 Nutritional anemia, unspecified: Secondary | ICD-10-CM

## 2021-07-12 DIAGNOSIS — Z5189 Encounter for other specified aftercare: Secondary | ICD-10-CM | POA: Diagnosis not present

## 2021-07-12 DIAGNOSIS — K5903 Drug induced constipation: Secondary | ICD-10-CM

## 2021-07-12 DIAGNOSIS — D61818 Other pancytopenia: Secondary | ICD-10-CM | POA: Diagnosis not present

## 2021-07-12 DIAGNOSIS — C8510 Unspecified B-cell lymphoma, unspecified site: Secondary | ICD-10-CM

## 2021-07-12 DIAGNOSIS — Z5111 Encounter for antineoplastic chemotherapy: Secondary | ICD-10-CM | POA: Diagnosis not present

## 2021-07-12 DIAGNOSIS — C9000 Multiple myeloma not having achieved remission: Secondary | ICD-10-CM | POA: Diagnosis not present

## 2021-07-12 LAB — CBC WITH DIFFERENTIAL (CANCER CENTER ONLY)
Abs Immature Granulocytes: 0 10*3/uL (ref 0.00–0.07)
Band Neutrophils: 6 %
Basophils Absolute: 0 10*3/uL (ref 0.0–0.1)
Basophils Relative: 1 %
Eosinophils Absolute: 0 10*3/uL (ref 0.0–0.5)
Eosinophils Relative: 1 %
HCT: 22.3 % — ABNORMAL LOW (ref 39.0–52.0)
Hemoglobin: 7.7 g/dL — ABNORMAL LOW (ref 13.0–17.0)
Lymphocytes Relative: 66 %
Lymphs Abs: 1.1 10*3/uL (ref 0.7–4.0)
MCH: 34.4 pg — ABNORMAL HIGH (ref 26.0–34.0)
MCHC: 34.5 g/dL (ref 30.0–36.0)
MCV: 99.6 fL (ref 80.0–100.0)
Monocytes Absolute: 0 10*3/uL — ABNORMAL LOW (ref 0.1–1.0)
Monocytes Relative: 3 %
Neutro Abs: 0.5 10*3/uL — ABNORMAL LOW (ref 1.7–7.7)
Neutrophils Relative %: 23 %
Platelet Count: 11 10*3/uL — ABNORMAL LOW (ref 150–400)
RBC: 2.24 MIL/uL — ABNORMAL LOW (ref 4.22–5.81)
RDW: 17.9 % — ABNORMAL HIGH (ref 11.5–15.5)
WBC Count: 1.6 10*3/uL — ABNORMAL LOW (ref 4.0–10.5)
nRBC: 0 % (ref 0.0–0.2)

## 2021-07-12 LAB — CMP (CANCER CENTER ONLY)
ALT: 35 U/L (ref 0–44)
AST: 17 U/L (ref 15–41)
Albumin: 3.3 g/dL — ABNORMAL LOW (ref 3.5–5.0)
Alkaline Phosphatase: 66 U/L (ref 38–126)
Anion gap: 6 (ref 5–15)
BUN: 25 mg/dL — ABNORMAL HIGH (ref 8–23)
CO2: 28 mmol/L (ref 22–32)
Calcium: 9 mg/dL (ref 8.9–10.3)
Chloride: 106 mmol/L (ref 98–111)
Creatinine: 0.82 mg/dL (ref 0.61–1.24)
GFR, Estimated: >60 mL/min (ref >60)
Glucose, Bld: 144 mg/dL — ABNORMAL HIGH (ref 70–99)
Potassium: 3.9 mmol/L (ref 3.5–5.1)
Sodium: 140 mmol/L (ref 135–145)
Total Bilirubin: 0.7 mg/dL (ref 0.3–1.2)
Total Protein: 5.9 g/dL — ABNORMAL LOW (ref 6.5–8.1)

## 2021-07-12 LAB — SAMPLE TO BLOOD BANK

## 2021-07-12 LAB — LACTATE DEHYDROGENASE: LDH: 114 U/L (ref 98–192)

## 2021-07-12 LAB — URIC ACID: Uric Acid, Serum: 3.2 mg/dL — ABNORMAL LOW (ref 3.7–8.6)

## 2021-07-12 LAB — PREPARE RBC (CROSSMATCH)

## 2021-07-12 MED ORDER — ACETAMINOPHEN 325 MG PO TABS
650.0000 mg | ORAL_TABLET | Freq: Once | ORAL | Status: AC
  Administered 2021-07-12: 650 mg via ORAL
  Filled 2021-07-12: qty 2

## 2021-07-12 MED ORDER — HEPARIN SOD (PORK) LOCK FLUSH 100 UNIT/ML IV SOLN
500.0000 [IU] | Freq: Every day | INTRAVENOUS | Status: AC | PRN
  Administered 2021-07-12: 500 [IU]

## 2021-07-12 MED ORDER — FUROSEMIDE 10 MG/ML IJ SOLN
20.0000 mg | Freq: Once | INTRAMUSCULAR | Status: DC
  Filled 2021-07-12: qty 2

## 2021-07-12 MED ORDER — DIPHENHYDRAMINE HCL 25 MG PO CAPS
25.0000 mg | ORAL_CAPSULE | Freq: Once | ORAL | Status: AC
  Administered 2021-07-12: 25 mg via ORAL
  Filled 2021-07-12: qty 1

## 2021-07-12 MED ORDER — SODIUM CHLORIDE 0.9% IV SOLUTION
250.0000 mL | Freq: Once | INTRAVENOUS | Status: AC
  Administered 2021-07-12: 250 mL via INTRAVENOUS

## 2021-07-12 MED ORDER — SODIUM CHLORIDE 0.9% FLUSH
10.0000 mL | INTRAVENOUS | Status: AC | PRN
  Administered 2021-07-12: 10 mL

## 2021-07-12 MED ORDER — SODIUM CHLORIDE 0.9% FLUSH
10.0000 mL | Freq: Once | INTRAVENOUS | Status: AC
  Administered 2021-07-12: 10 mL

## 2021-07-12 NOTE — Patient Instructions (Signed)
Blood Transfusion, Adult, Care After This sheet gives you information about how to care for yourself after your procedure. Your doctor may also give you more specific instructions. If you have problems or questions, contact your doctor. What can I expect after the procedure? After the procedure, it is common to have: Bruising and soreness at the IV site. A headache. Follow these instructions at home: Insertion site care     Follow instructions from your doctor about how to take care of your insertion site. This is where an IV tube was put into your vein. Make sure you: Wash your hands with soap and water before and after you change your bandage (dressing). If you cannot use soap and water, use hand sanitizer. Change your bandage as told by your doctor. Check your insertion site every day for signs of infection. Check for: Redness, swelling, or pain. Bleeding from the site. Warmth. Pus or a bad smell. General instructions Take over-the-counter and prescription medicines only as told by your doctor. Rest as told by your doctor. Go back to your normal activities as told by your doctor. Keep all follow-up visits as told by your doctor. This is important. Contact a doctor if: You have itching or red, swollen areas of skin (hives). You feel worried or nervous (anxious). You feel weak after doing your normal activities. You have redness, swelling, warmth, or pain around the insertion site. You have blood coming from the insertion site, and the blood does not stop with pressure. You have pus or a bad smell coming from the insertion site. Get help right away if: You have signs of a serious reaction. This may be coming from an allergy or the body's defense system (immune system). Signs include: Trouble breathing or shortness of breath. Swelling of the face or feeling warm (flushed). Fever or chills. Head, chest, or back pain. Dark pee (urine) or blood in the pee. Widespread rash. Fast  heartbeat. Feeling dizzy or light-headed. You may receive your blood transfusion in an outpatient setting. If so, you will be told whom to contact to report any reactions. These symptoms may be an emergency. Do not wait to see if the symptoms will go away. Get medical help right away. Call your local emergency services (911 in the [place] Do not drive yourself to the hospital. Summary Bruising and soreness at the IV site are common. Check your insertion site every day for signs of infection. Rest as told by your doctor. Go back to your normal activities as told by your doctor. Get help right away if you have signs of a serious reaction. This information is not intended to replace advice given to you by your health care provider. Make sure you discuss any questions you have with your health care provider. Document Revised: 06/18/2020 Document Reviewed: 08/16/2018 Elsevier Patient Education  2023 Else[employer] Inc[employer]  

## 2021-07-12 NOTE — Progress Notes (Signed)
[facility] ?OFFICE PROGRESS NOTE ? ?Patient Care Team: ?[name], [name], MD as PCP - General ?Gatha Mayer, MD (Gastroenterology) ?Kristeen Miss, MD as Consulting Physician (Neurosurgery) ? 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 ? ?Deficiency anemia ?The cause of the pancytopenia is related to his bone marrow disease and side effects of treatment ?He is symptomatic with fatigue and excessive bruising ?We discussed some of the risks, benefits, and alternatives of blood transfusions. The patient is symptomatic from anemia and the hemoglobin level is critically low.  Some of the side-effects to be expected including risks of transfusion reactions, chills, infection, syndrome of volume overload and risk of hospitalization from various reasons and the patient is willing to proceed and went ahead to sign consent today. ?I recommend 1 unit of blood and 1 unit of platelet due to extensive bruises ?We discussed some of the risks, benefits, and alternatives of platelets transfusions. The patient is symptomatic from low platelet counts with bruising/bleeding/at high risk of life-threatening bleeding and the platelet count is critically low.  Some of the side-effects to be expected including risks of transfusion reactions, chills, infection, syndrome of volume overload and risk of hospitalization from various reasons and the patient is willing to proceed and went ahead to sign consent today. ? ?We will reassess next week ? ?Constipation ?He continues to have severe constipation ?He refused MiraLAX yesterday ?We have extensive discussion about the importance of regular laxatives ?I recommend continue  MiraLAX daily and add Senokot ? ?Orders Placed This Encounter  ?Procedures  ? Care order/instruction  ?  Transfuse Parameters  ?  Standing Status:   Future  ?  Standing Expiration Date:   07/12/2022  ? Informed Consent Details: Physician/Practitioner Attestation; Transcribe to consent form and obtain patient signature  ?  Standing Status:   Future  ?  Standing Expiration Date:   07/13/2022  ?  Order Specific Question:   Physician/Practitioner attestation of informed consent for blood and or blood product transfusion  ?  Answer:   I, the physician/practitioner, attest that I have discussed with the patient the benefits, risks, side effects, alternatives, likelihood of achieving goals and potential problems during recovery for the procedure that I have provided informed consent.  ?  Order Specific Question:   Product(s)  ?  Answer:   All Product(s)  ? Type and screen  ?   ?  ?  Standing Status:   Future  ?  Number of Occurrences:   1  ?  Standing Expiration Date:   07/13/2022  ? Prepare RBC (crossmatch)  ?  Standing Status:   Standing  ?  Number of Occurrences:   1  ?  Order Specific Question:   # of Units  ?  Answer:   1 unit  ?  Order Specific Question:   Transfusion Indications  ?  Answer:   Symptomatic Anemia  ?  Order Specific Question:   Special Requirements  ?  Answer:   Irradiated-IRR  ?  Order Specific Question:   Number of Units to Keep Ahead  ?  Answer:   NO units ahead  ?  Order Specific Question:  Instructions:  ?  Answer:   Transfuse  ?  Order Specific Question:   If emergent release call blood bank  ?  Answer:   Not emergent release  ? Prepare platelet pheresis  ?  Standing Status:   Standing  ?  Number of Occurrences:   1  ?  Order Specific Question:   Number of Apheresis Units  ?  Answer:   1 unit  ?  Order Specific Question:   Transfusion Indications  ?  Answer:   Platelet Dysfunction  ?  Order Specific Question:   Transfusion Indications  ?  Answer:   Transfuse  ?  Order Specific Question:   Date/Time  blood product needed  ?  Answer:   For hold only  ?  Order Specific Question:   If emergent release call blood bank  ?  Answer:   Not emergent release  ? ? ?All questions were answered. The patient knows to call the clinic with any problems, questions or concerns. ?The total time spent in the appointment was 30 minutes encounter with patients including review of chart and various tests results, discussions about plan of care and coordination of care plan ?  ?Henry Lark, MD ?07/12/2021 10:06 AM ? ?INTERVAL HISTORY: ?Please see below for problem oriented charting. ?he returns for treatment follow-up with his wife ?He felt better after blood transfusion recently ?He has gained some weight ?He complain of constipation with no bowel movement recently, third day today ?He refused MiraLAX yesterday ?His wife noted more bruising on his skin ?No recent fever or chills ?The patient denies any recent signs or symptoms of bleeding such as spontaneous epistaxis, hematuria or hematochezia. ? ? ?REVIEW OF SYSTEMS:   ?Constitutional: Denies fevers, chills or abnormal weight loss ?Eyes: Denies blurriness of vision ?Ears, nose, mouth, throat, and face: Denies mucositis or sore throat ?Respiratory: Denies cough, dyspnea or wheezes ?Cardiovascular: Denies palpitation, chest discomfort or lower extremity swelling ?Skin: Denies abnormal skin rashes ?Lymphatics: Denies new lymphadenopathy  ?Neurological:Denies numbness, tingling or new weaknesses ?Behavioral/Psych: Mood is stable, no new changes  ?All other systems were reviewed with the patient and are negative. ? ?I have reviewed the past medical history, past surgical history, social history and family history with the patient and they are unchanged from previous note. ? ?ALLERGIES:  has No Known Allergies. ? ?MEDICATIONS:  ?Current Outpatient Medications  ?Medication Sig Dispense Refill  ? acetaminophen (TYLENOL) 325 MG tablet Take 650 mg by mouth every 6 (six) hours as needed for  moderate pain.    ? acyclovir (ZOVIRAX) 400 MG tablet Take 1 tablet (400 mg total) by mouth daily. 30 tablet 6  ? allopurinol (ZYLOPRIM) 300 MG tablet Take 1 tablet (300 mg total) by mouth daily. 30 tablet 0  ? atorvastatin (LIPITOR) 40 MG tablet TAKE ONE (1) TABLET BY MOUTH EVERY DAY AT 6PM. (Patient taking differently: Take 20 mg by mouth every other day.) 30 tablet 5  ? B Complex-Folic Acid (B COMPLEX PLUS) TABS Take 1 tablet by mouth daily. 100 tablet 3  ? Cholecalciferol (VITAMIN D3) 50 MCG (2000 UT) capsule Take 1 capsule (2,000 Units total) by mouth daily. 100 capsule 3  ? donepezil (ARICEPT) 10 MG tablet Take 1 tablet (10 mg total) by mouth at bedtime. 90 tablet 3  ? lidocaine-prilocaine (EMLA) cream Apply to affected area once 30 g 3  ? megestrol (MEGACE) 40 MG tablet Take 1 tablet (40 mg total) by mouth 2 (two) times daily. 60 tablet  5  ? memantine (NAMENDA) 10 MG tablet TAKE 1 TABLET BY MOUTH TWICE DAILY. 60 tablet 3  ? Misc Natural Products (PROSTATE HEALTH) CAPS Take 1 capsule by mouth in the morning and at bedtime.    ? ondansetron (ZOFRAN) 8 MG tablet Take 1 tablet (8 mg total) by mouth 2 (two) times daily as needed for refractory nausea / vomiting. Start on day 3 after cyclophosphamide chemotherapy. 30 tablet 1  ? polyethylene glycol (MIRALAX) 17 g packet Take 17 g by mouth daily. 30 each 1  ? predniSONE (DELTASONE) 20 MG tablet Take 0.5 tablets (10 mg total) by mouth daily with breakfast. 60 tablet 1  ? prochlorperazine (COMPAZINE) 10 MG tablet Take 1 tablet (10 mg total) by mouth every 6 (six) hours as needed (Nausea or vomiting). 30 tablet 6  ? QUEtiapine (SEROQUEL) 25 MG tablet Take 0.5 tablets (12.5 mg total) by mouth at bedtime. 90 tablet 1  ? tamsulosin (FLOMAX) 0.4 MG CAPS capsule Take 0.4 mg by mouth daily.    ? ?No current facility-administered medications for this visit.  ? ?Facility-Administered Medications Ordered in Other Visits  ?Medication Dose Route Frequency Provider Last Rate Last  Admin  ? heparin lock flush 100 unit/mL  500 Units Intracatheter Daily PRN Alvy Bimler, Shimeka Bacot, MD      ? sodium chloride flush (NS) 0.9 % injection 10 mL  10 mL Intracatheter PRN Henry Lark, MD      ? ? ?SUMMARY O

## 2021-07-12 NOTE — Assessment & Plan Note (Signed)
He continues to have severe constipation ?He refused MiraLAX yesterday ?We have extensive discussion about the importance of regular laxatives ?I recommend continue MiraLAX daily and add Senokot ?

## 2021-07-12 NOTE — Assessment & Plan Note (Signed)
The cause of the pancytopenia is related to his bone marrow disease and side effects of treatment ?He is symptomatic with fatigue and excessive bruising ?We discussed some of the risks, benefits, and alternatives of blood transfusions. The patient is symptomatic from anemia and the hemoglobin level is critically low.  Some of the side-effects to be expected including risks of transfusion reactions, chills, infection, syndrome of volume overload and risk of hospitalization from various reasons and the patient is willing to proceed and went ahead to sign consent today. ?I recommend 1 unit of blood and 1 unit of platelet due to extensive bruises ?We discussed some of the risks, benefits, and alternatives of platelets transfusions. The patient is symptomatic from low platelet counts with bruising/bleeding/at high risk of life-threatening bleeding and the platelet count is critically low.  Some of the side-effects to be expected including risks of transfusion reactions, chills, infection, syndrome of volume overload and risk of hospitalization from various reasons and the patient is willing to proceed and went ahead to sign consent today. ? ?We will reassess next week ?

## 2021-07-12 NOTE — Assessment & Plan Note (Signed)
He denies side effects from recent treatment ?His severe pancytopenia is to be expected ?We will arrange for 1 unit of blood as well as 1 unit of platelet transfusion ?He bruises more since last time I saw him ?I recommend we continue on prednisone dose at 10 mg daily and reassess next week ? ?

## 2021-07-13 ENCOUNTER — Telehealth: Payer: Self-pay

## 2021-07-13 LAB — PREPARE PLATELET PHERESIS: Unit division: 0

## 2021-07-13 LAB — BPAM RBC
Blood Product Expiration Date: 202306052359
ISSUE DATE / TIME: 202305081133
Unit Type and Rh: 5100

## 2021-07-13 LAB — TYPE AND SCREEN
ABO/RH(D): O POS
Antibody Screen: NEGATIVE
Unit division: 0

## 2021-07-13 LAB — BPAM PLATELET PHERESIS
Blood Product Expiration Date: 202305102359
ISSUE DATE / TIME: 202305081022
Unit Type and Rh: 5100

## 2021-07-13 NOTE — Telephone Encounter (Signed)
-----   Message from Heath Lark, MD sent at 07/13/2021  8:13 AM EDT ----- ?Pls call him./wife and check on his constipation status ? ?

## 2021-07-13 NOTE — Telephone Encounter (Signed)
Called wife to check on constipation. Henry [name] still has not had a bm. She is going to the drug store today and will start giving him 2 senokots TID/ he will start Miralax BID now. ?Wife will call the office back for further problems or concerns. ?

## 2021-07-15 ENCOUNTER — Telehealth: Payer: Self-pay

## 2021-07-15 NOTE — Telephone Encounter (Signed)
Called and given below message. Per wife multiple bm's over 12 hours yesterday. Constipation has resolved. She appreciated the call. ?

## 2021-07-15 NOTE — Telephone Encounter (Signed)
-----   Message from Heath Lark, MD sent at 07/15/2021  7:41 AM EDT ----- ?Non urgent can you call again and ask situation about her BM? ? ?

## 2021-07-19 ENCOUNTER — Other Ambulatory Visit: Payer: Self-pay

## 2021-07-19 ENCOUNTER — Inpatient Hospital Stay: Payer: Medicare HMO

## 2021-07-19 ENCOUNTER — Inpatient Hospital Stay: Payer: Medicare HMO | Admitting: Hematology and Oncology

## 2021-07-19 ENCOUNTER — Encounter: Payer: Self-pay | Admitting: Hematology and Oncology

## 2021-07-19 DIAGNOSIS — D61818 Other pancytopenia: Secondary | ICD-10-CM

## 2021-07-19 DIAGNOSIS — Z5189 Encounter for other specified aftercare: Secondary | ICD-10-CM | POA: Diagnosis not present

## 2021-07-19 DIAGNOSIS — C8510 Unspecified B-cell lymphoma, unspecified site: Secondary | ICD-10-CM

## 2021-07-19 DIAGNOSIS — Z5111 Encounter for antineoplastic chemotherapy: Secondary | ICD-10-CM | POA: Diagnosis not present

## 2021-07-19 DIAGNOSIS — C9000 Multiple myeloma not having achieved remission: Secondary | ICD-10-CM | POA: Diagnosis not present

## 2021-07-19 DIAGNOSIS — D539 Nutritional anemia, unspecified: Secondary | ICD-10-CM | POA: Diagnosis not present

## 2021-07-19 LAB — CMP (CANCER CENTER ONLY)
ALT: 45 U/L — ABNORMAL HIGH (ref 0–44)
AST: 21 U/L (ref 15–41)
Albumin: 3.4 g/dL — ABNORMAL LOW (ref 3.5–5.0)
Alkaline Phosphatase: 67 U/L (ref 38–126)
Anion gap: 6 (ref 5–15)
BUN: 22 mg/dL (ref 8–23)
CO2: 26 mmol/L (ref 22–32)
Calcium: 9 mg/dL (ref 8.9–10.3)
Chloride: 107 mmol/L (ref 98–111)
Creatinine: 0.77 mg/dL (ref 0.61–1.24)
GFR, Estimated: >60 mL/min (ref >60)
Glucose, Bld: 143 mg/dL — ABNORMAL HIGH (ref 70–99)
Potassium: 3.5 mmol/L (ref 3.5–5.1)
Sodium: 139 mmol/L (ref 135–145)
Total Bilirubin: 0.5 mg/dL (ref 0.3–1.2)
Total Protein: 6.2 g/dL — ABNORMAL LOW (ref 6.5–8.1)

## 2021-07-19 LAB — CBC WITH DIFFERENTIAL (CANCER CENTER ONLY)
Abs Immature Granulocytes: 0.04 10*3/uL (ref 0.00–0.07)
Basophils Absolute: 0 10*3/uL (ref 0.0–0.1)
Basophils Relative: 0 %
Eosinophils Absolute: 0 10*3/uL (ref 0.0–0.5)
Eosinophils Relative: 1 %
HCT: 25.9 % — ABNORMAL LOW (ref 39.0–52.0)
Hemoglobin: 8.6 g/dL — ABNORMAL LOW (ref 13.0–17.0)
Immature Granulocytes: 1 %
Lymphocytes Relative: 28 %
Lymphs Abs: 1.4 10*3/uL (ref 0.7–4.0)
MCH: 32.8 pg (ref 26.0–34.0)
MCHC: 33.2 g/dL (ref 30.0–36.0)
MCV: 98.9 fL (ref 80.0–100.0)
Monocytes Absolute: 0.4 10*3/uL (ref 0.1–1.0)
Monocytes Relative: 8 %
Neutro Abs: 3.1 10*3/uL (ref 1.7–7.7)
Neutrophils Relative %: 62 %
Platelet Count: 77 10*3/uL — ABNORMAL LOW (ref 150–400)
RBC: 2.62 MIL/uL — ABNORMAL LOW (ref 4.22–5.81)
RDW: 19.6 % — ABNORMAL HIGH (ref 11.5–15.5)
WBC Count: 4.9 10*3/uL (ref 4.0–10.5)
nRBC: 0 % (ref 0.0–0.2)

## 2021-07-19 LAB — SAMPLE TO BLOOD BANK

## 2021-07-19 LAB — URIC ACID: Uric Acid, Serum: 3.6 mg/dL — ABNORMAL LOW (ref 3.7–8.6)

## 2021-07-19 LAB — LACTATE DEHYDROGENASE: LDH: 107 U/L (ref 98–192)

## 2021-07-19 MED ORDER — SODIUM CHLORIDE 0.9% FLUSH
10.0000 mL | Freq: Once | INTRAVENOUS | Status: AC
  Administered 2021-07-19: 10 mL

## 2021-07-19 NOTE — Assessment & Plan Note (Signed)
His severe pancytopenia is related to bone marrow disease ?After transfusion support, his blood count has improved dramatically ?Even though we did not give him platelet transfusion recently, I do not anticipate his platelet count will go up to this high level of 77,000 today ?He does not need transfusion support today ?I will continue to see him on a weekly basis for further follow-up and transfusion as needed ?

## 2021-07-19 NOTE — Assessment & Plan Note (Addendum)
Overall, he tolerated recent treatment well except for severe pancytopenia requiring transfusion support ?With recovery of his CBC and improvement of his platelet count, I am hopeful that his bone marrow is recovering ?We will proceed with treatment despite persistent pancytopenia ?He will return weekly for transfusion support ?I am also recommending prednisone taper ?He will continue high-dose prednisone for few days after chemotherapy this week ?Starting next week, I will get him off prednisone completely ?

## 2021-07-19 NOTE — Progress Notes (Signed)
[facility] ?OFFICE PROGRESS NOTE ? ?Patient Care Team: ?[name], [name], MD as PCP - General ?Gatha Mayer, MD (Gastroenterology) ?Kristeen Miss, MD as Consulting Physician (Neurosurgery) ? 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 ?Pancytopenia ([place]) ?His severe pancytopenia is related to bone marrow disease ?After transfusion support, his blood count has improved dramatically ?Even though we did not give him platelet transfusion recently, I do not anticipate his platelet count will go up to this high level of 77,000 today ?He does not need transfusion support today ?I will continue to see him on a weekly basis for further follow-up and transfusion as needed ? ?No orders of the defined types were placed in this encounter. ? ? ?All questions were answered. The patient knows to call the clinic with any problems, questions or concerns. ?The total time spent in the appointment was 20 minutes encounter with patients including review of chart and various tests results, discussions about plan of care and coordination of care plan ?  ?Heath Lark, MD ?07/19/2021 3:29 PM ? ?INTERVAL HISTORY: ?Please see below for problem oriented charting. ?he returns for treatment follow-up with his wife ?His energy level has improved since last time I saw him ?He is eating better ?Constipation has resolved ?The patient denies any recent signs or symptoms of bleeding such as spontaneous epistaxis,  hematuria or hematochezia. ? ? ?REVIEW OF SYSTEMS:   ?Constitutional: Denies fevers, chills or abnormal weight loss ?Eyes: Denies blurriness of vision ?Ears, nose, mouth, throat, and face: Denies mucositis or sore throat ?Respiratory: Denies cough, dyspnea or wheezes ?Cardiovascular: Denies palpitation, chest discomfort or lower extremity swelling ?Gastrointestinal:  Denies nausea, heartburn or change in bowel habits ?Skin: Denies abnormal skin rashes ?Lymphatics: Denies new lymphadenopathy or easy bruising ?Neurological:Denies numbness, tingling or new weaknesses ?Behavioral/Psych: Mood is stable, no new changes  ?All other systems were reviewed with the patient and are negative. ? ?I have reviewed the past medical history, past surgical history, social history and family history with the patient and they are unchanged from previous note. ? ?ALLERGIES:  has No Known Allergies. ? ?MEDICATIONS:  ?Current Outpatient Medications  ?Medication Sig Dispense Refill  ? acetaminophen (TYLENOL) 325 MG tablet Take 650 mg by mouth every 6 (six) hours as needed for moderate pain.    ? acyclovir (ZOVIRAX) 400 MG tablet Take 1 tablet (400 mg total) by mouth daily. 30 tablet 6  ? atorvastatin (LIPITOR) 40 MG tablet TAKE ONE (1) TABLET BY MOUTH EVERY DAY AT 6PM. (Patient taking differently: Take 20 mg by mouth every other day.) 30 tablet 5  ? B Complex-Folic Acid (B COMPLEX PLUS) TABS Take 1 tablet by mouth daily. 100 tablet 3  ? Cholecalciferol (VITAMIN D3) 50 MCG (2000 UT) capsule Take 1 capsule (2,000 Units total) by mouth daily. 100 capsule 3  ? donepezil (ARICEPT) 10 MG tablet Take 1 tablet (10 mg total) by mouth at bedtime. 90 tablet 3  ? lidocaine-prilocaine (EMLA) cream Apply to affected area once 30 g 3  ? megestrol (MEGACE)  40 MG tablet Take 1 tablet (40 mg total) by mouth 2 (two) times daily. 60 tablet 5  ? memantine (NAMENDA) 10 MG tablet TAKE 1 TABLET BY MOUTH TWICE DAILY. 60 tablet 3  ? Misc Natural Products (PROSTATE  HEALTH) CAPS Take 1 capsule by mouth in the morning and at bedtime.    ? ondansetron (ZOFRAN) 8 MG tablet Take 1 tablet (8 mg total) by mouth 2 (two) times daily as needed for refractory nausea / vomiting. Start on day 3 after cyclophosphamide chemotherapy. 30 tablet 1  ? polyethylene glycol (MIRALAX) 17 g packet Take 17 g by mouth daily. 30 each 1  ? predniSONE (DELTASONE) 20 MG tablet Take 0.5 tablets (10 mg total) by mouth daily with breakfast. 60 tablet 1  ? prochlorperazine (COMPAZINE) 10 MG tablet Take 1 tablet (10 mg total) by mouth every 6 (six) hours as needed (Nausea or vomiting). 30 tablet 6  ? QUEtiapine (SEROQUEL) 25 MG tablet Take 0.5 tablets (12.5 mg total) by mouth at bedtime. 90 tablet 1  ? tamsulosin (FLOMAX) 0.4 MG CAPS capsule Take 0.4 mg by mouth daily.    ? ?No current facility-administered medications for this visit.  ? ? 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 1. No evidence for primary malignancy or metastatic disease in the chest, abdomen, or pelvis. ?2. Subtle edema/inflammation in the sigmoid mesocolon in the region of an ill-defined diverticulum. Imaging features consistent with sigmoid diverticulitis. No perforation or abscess. ?3. Prostatomegaly with evidence of median lobe hypertrophy generating mass-effect on the inferior bladder. ?4. Nonacute left-sided rib fractures. ?5. 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 ?6. Aortic Atherosclerosis (ICD10-I70.0) and Emphysema (ICD10-J43.9). ?  ?06/14/2021 Bone Marrow Biopsy  ? BONE MARROW, ASPIRATE, CLOT, CORE:  ?-Hypercellular bone marrow with extensive involvement by a B-cell lymphoproliferative disorder  ?-Monoclonal plasma cell population identified  ?-See comment  ? ?PERIPHERAL  BLOOD:  ?-Pancytopenia  ? ?COMMENT:  ? ?The bone marrow shows extensive involvement by a high-grade B-cell lymphoproliferative disorder displaying dim surface lambda light chain expression and bright CD45 consistent with involvement by high-grade B-cell lymphoma.  In addition, there is an admixed monoclonal plasma cell population representing 20% of all cells in the aspirate and displaying kappa light chain restriction consistent with a neoplastic process.  Since the lymphoid and the plasma cell components have different light chain expression, they appear unrelated and may represent two distinct disease processes.  Nonetheless, correlation with cytogenetic and FISH studies is recommended ?  ?06/18/2021 Initial Diagnosis  ? 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  ?06/18/2021 Cancer Staging  ? Staging form: Hodgkin and Non-Hodgkin Lymphoma, AJCC 8th Edition ?- Clinical stage from 06/18/2021: Stage IV (Unknown) - Signed by Heath Lark, MD on 06/18/2021 ?Stage prefix: Initial diagnosis ? ?  ?06/24/2021 Procedure  ? Placement of a subcutaneous power-injectable port device. Catheter tip at the superior cavoatrial junction. ?  ?07/01/2021 Echocardiogram  ? 1. Hypokinesis of the basal inferolateral wall with overall low normal LV function. ? 2. Left ventricular ejection fraction, by estimation, is 50 to 55%. The left ventricle has low normal function. The left ventricle demonstrates regional wall motion abnormalities (see scoring diagram/findings for description). There is mild left ventricular hypertrophy of the basal-septal segment. Left ventricular diastolic parameters are  consistent with Grade I diastolic dysfunction (impaired relaxation). Elevated left atrial pressure. ? 3. Right ventricular systolic function is normal. The right ventricular size is normal. ? 4. The mitral valve is normal in structure. Mild mitral valve regurgitation. No evidence of mitral stenosis. ? 5. The aortic valve is tricuspid. Aortic valve regurgitation  is trivial. Aortic valve sclerosis is present, with no evidence of aortic valve stenosis. ? 6. Aortic dilatation noted. There is mild dilatation of the aortic root, measuring 41 mm. ?  ?  ?07/02/2021 -  C

## 2021-07-22 MED FILL — Dexamethasone Sodium Phosphate Inj 100 MG/10ML: INTRAMUSCULAR | Qty: 1 | Status: AC

## 2021-07-22 MED FILL — Fosaprepitant Dimeglumine For IV Infusion 150 MG (Base Eq): INTRAVENOUS | Qty: 5 | Status: AC

## 2021-07-23 ENCOUNTER — Inpatient Hospital Stay: Payer: Medicare HMO | Admitting: Dietician

## 2021-07-23 ENCOUNTER — Other Ambulatory Visit: Payer: Self-pay

## 2021-07-23 ENCOUNTER — Inpatient Hospital Stay: Payer: Medicare HMO

## 2021-07-23 VITALS — BP 94/79 | HR 73 | Temp 98.2°F | Resp 17

## 2021-07-23 DIAGNOSIS — D61818 Other pancytopenia: Secondary | ICD-10-CM | POA: Diagnosis not present

## 2021-07-23 DIAGNOSIS — C9000 Multiple myeloma not having achieved remission: Secondary | ICD-10-CM | POA: Diagnosis not present

## 2021-07-23 DIAGNOSIS — D539 Nutritional anemia, unspecified: Secondary | ICD-10-CM | POA: Diagnosis not present

## 2021-07-23 DIAGNOSIS — C8510 Unspecified B-cell lymphoma, unspecified site: Secondary | ICD-10-CM | POA: Diagnosis not present

## 2021-07-23 DIAGNOSIS — Z5189 Encounter for other specified aftercare: Secondary | ICD-10-CM | POA: Diagnosis not present

## 2021-07-23 DIAGNOSIS — Z5111 Encounter for antineoplastic chemotherapy: Secondary | ICD-10-CM | POA: Diagnosis not present

## 2021-07-23 MED ORDER — ACETAMINOPHEN 325 MG PO TABS
650.0000 mg | ORAL_TABLET | Freq: Once | ORAL | Status: AC
  Administered 2021-07-23: 650 mg via ORAL
  Filled 2021-07-23: qty 2

## 2021-07-23 MED ORDER — SODIUM CHLORIDE 0.9% FLUSH
10.0000 mL | INTRAVENOUS | Status: DC | PRN
  Administered 2021-07-23: 10 mL

## 2021-07-23 MED ORDER — DOXORUBICIN HCL CHEMO IV INJECTION 2 MG/ML
25.0000 mg/m2 | Freq: Once | INTRAVENOUS | Status: AC
  Administered 2021-07-23: 44 mg via INTRAVENOUS
  Filled 2021-07-23: qty 22

## 2021-07-23 MED ORDER — SODIUM CHLORIDE 0.9 % IV SOLN
375.0000 mg/m2 | Freq: Once | INTRAVENOUS | Status: AC
  Administered 2021-07-23: 700 mg via INTRAVENOUS
  Filled 2021-07-23: qty 50

## 2021-07-23 MED ORDER — VINCRISTINE SULFATE CHEMO INJECTION 1 MG/ML
1.0000 mg | Freq: Once | INTRAVENOUS | Status: AC
  Administered 2021-07-23: 1 mg via INTRAVENOUS
  Filled 2021-07-23: qty 1

## 2021-07-23 MED ORDER — SODIUM CHLORIDE 0.9 % IV SOLN
375.0000 mg/m2 | Freq: Once | INTRAVENOUS | Status: AC
  Administered 2021-07-23: 660 mg via INTRAVENOUS
  Filled 2021-07-23: qty 33

## 2021-07-23 MED ORDER — SODIUM CHLORIDE 0.9 % IV SOLN
10.0000 mg | Freq: Once | INTRAVENOUS | Status: AC
  Administered 2021-07-23: 10 mg via INTRAVENOUS
  Filled 2021-07-23: qty 10

## 2021-07-23 MED ORDER — PALONOSETRON HCL INJECTION 0.25 MG/5ML
0.2500 mg | Freq: Once | INTRAVENOUS | Status: AC
  Administered 2021-07-23: 0.25 mg via INTRAVENOUS
  Filled 2021-07-23: qty 5

## 2021-07-23 MED ORDER — DIPHENHYDRAMINE HCL 25 MG PO CAPS
25.0000 mg | ORAL_CAPSULE | Freq: Once | ORAL | Status: AC
  Administered 2021-07-23: 25 mg via ORAL
  Filled 2021-07-23: qty 1

## 2021-07-23 MED ORDER — SODIUM CHLORIDE 0.9 % IV SOLN
150.0000 mg | Freq: Once | INTRAVENOUS | Status: AC
  Administered 2021-07-23: 150 mg via INTRAVENOUS
  Filled 2021-07-23: qty 150

## 2021-07-23 MED ORDER — SODIUM CHLORIDE 0.9 % IV SOLN: Freq: Once | INTRAVENOUS | Status: AC

## 2021-07-23 MED ORDER — SODIUM CHLORIDE 0.9 % IV SOLN: 375.0000 mg/m2 | Freq: Once | INTRAVENOUS | Status: DC

## 2021-07-23 MED ORDER — HEPARIN SOD (PORK) LOCK FLUSH 100 UNIT/ML IV SOLN
500.0000 [IU] | Freq: Once | INTRAVENOUS | Status: AC | PRN
  Administered 2021-07-23: 500 [IU]

## 2021-07-23 NOTE — Patient Instructions (Addendum)
[facility]  Discharge Instructions: Thank you for choosing [facility] to provide your oncology and hematology care.   If you have a lab appointment with the [facility], please go directly to the [facility] and check in at the registration area.   Wear comfortable clothing and clothing appropriate for easy access to any Portacath or PICC line.   We strive to give you quality time with your provider. You may need to reschedule your appointment if you arrive late (15 or more minutes).  Arriving late affects you and other patients whose appointments are after yours.  Also, if you miss three or more appointments without notifying the office, you may be dismissed from the clinic at the provider's discretion.      For prescription refill requests, have your pharmacy contact our office and allow 72 hours for refills to be completed.    Today you received the following chemotherapy and/or immunotherapy agents:  Adriamycin, Vincristine,  Cytoxan, Rituximab   To help prevent nausea and vomiting after your treatment, we encourage you to take your nausea medication as directed.  BELOW ARE SYMPTOMS THAT SHOULD BE REPORTED IMMEDIATELY: *FEVER GREATER THAN 100.4 F (38 C) OR HIGHER *CHILLS OR SWEATING *NAUSEA AND VOMITING THAT IS NOT CONTROLLED WITH YOUR NAUSEA MEDICATION *UNUSUAL SHORTNESS OF BREATH *UNUSUAL BRUISING OR BLEEDING *URINARY PROBLEMS (pain or burning when urinating, or frequent urination) *BOWEL PROBLEMS (unusual diarrhea, constipation, pain near the anus) TENDERNESS IN MOUTH AND THROAT WITH OR WITHOUT PRESENCE OF ULCERS (sore throat, sores in mouth, or a toothache) UNUSUAL RASH, SWELLING OR PAIN  UNUSUAL VAGINAL DISCHARGE OR ITCHING   Items with * indicate a potential emergency and should be followed up as soon as possible or go to the Emergency Department if any problems should occur.  Please show the CHEMOTHERAPY ALERT CARD or  IMMUNOTHERAPY ALERT CARD at check-in to the Emergency Department and triage nurse.  Should you have questions after your visit or need to cancel or reschedule your appointment, please contact [facility]  Dept: [phone]  and follow the prompts.  Office hours are 8:00 a.m. to 4:30 p.m. Monday - Friday. Please note that voicemails left after 4:00 p.m. may not be returned until the following business day.  We are closed weekends and major holidays. You have access to a nurse at all times for urgent questions. Please call the main number to the clinic Dept: [phone] and follow the prompts.   For any non-urgent questions, you may also contact your provider using MyChart. We now offer e-Visits for anyone 7 and older to request care online for non-urgent symptoms. For details visit [email or web address].   Also download the MyChart app! Go to the app store, search "MyChart", open the app, select [facility], and log in with your MyChart username and password.  Due to Covid, a mask is required upon entering the hospital/clinic. If you do not have a mask, one will be given to you upon arrival. For doctor visits, patients may have 1 support person aged 48 or older with them. For treatment visits, patients cannot have anyone with them due to current Covid guidelines and our immunocompromised population.

## 2021-07-23 NOTE — Progress Notes (Signed)
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 Patient is receiving reduced dose R-CHOP q21d  Met with patient in infusion. He reports feeling great. Patient denies nutrition impact symptoms. He states his appetite is "wide open." This morning he had 2 eggs, biscuit with gravy and 2 sausage links. He is eating 3 meals and drinking 2 ensure. Patient reports sometimes he sneaks a strawberry pudding.   Medications: reviewed   Labs: 5/15 - glucose 143  Anthropometrics: Weight 139 lb 4.8 oz on 5/15 increased   5/8 - 138 lb 4/28 - 132 lb 12 oz 3/16 - 145 lb 8 oz    NUTRITION DIAGNOSIS: Severe malnutrition going, but improving    INTERVENTION:  Continue 2 Ensure Plus/equivalent to promote weight gain - coupons provided     MONITORING, EVALUATION, GOAL: weight trends, intake    NEXT VISIT: To be scheduled

## 2021-07-26 ENCOUNTER — Encounter: Payer: Self-pay | Admitting: Hematology and Oncology

## 2021-07-26 ENCOUNTER — Inpatient Hospital Stay: Payer: Medicare HMO

## 2021-07-26 ENCOUNTER — Other Ambulatory Visit: Payer: Self-pay

## 2021-07-26 ENCOUNTER — Inpatient Hospital Stay (HOSPITAL_BASED_OUTPATIENT_CLINIC_OR_DEPARTMENT_OTHER): Payer: Medicare HMO | Admitting: Hematology and Oncology

## 2021-07-26 DIAGNOSIS — D539 Nutritional anemia, unspecified: Secondary | ICD-10-CM | POA: Diagnosis not present

## 2021-07-26 DIAGNOSIS — C8510 Unspecified B-cell lymphoma, unspecified site: Secondary | ICD-10-CM

## 2021-07-26 DIAGNOSIS — Z5189 Encounter for other specified aftercare: Secondary | ICD-10-CM | POA: Diagnosis not present

## 2021-07-26 DIAGNOSIS — Z5111 Encounter for antineoplastic chemotherapy: Secondary | ICD-10-CM | POA: Diagnosis not present

## 2021-07-26 DIAGNOSIS — C9000 Multiple myeloma not having achieved remission: Secondary | ICD-10-CM

## 2021-07-26 DIAGNOSIS — D61818 Other pancytopenia: Secondary | ICD-10-CM | POA: Diagnosis not present

## 2021-07-26 LAB — CBC WITH DIFFERENTIAL (CANCER CENTER ONLY)
Abs Immature Granulocytes: 0.02 10*3/uL (ref 0.00–0.07)
Basophils Absolute: 0 10*3/uL (ref 0.0–0.1)
Basophils Relative: 0 %
Eosinophils Absolute: 0 10*3/uL (ref 0.0–0.5)
Eosinophils Relative: 0 %
HCT: 25.2 % — ABNORMAL LOW (ref 39.0–52.0)
Hemoglobin: 8.3 g/dL — ABNORMAL LOW (ref 13.0–17.0)
Immature Granulocytes: 0 %
Lymphocytes Relative: 14 %
Lymphs Abs: 0.6 10*3/uL — ABNORMAL LOW (ref 0.7–4.0)
MCH: 33.5 pg (ref 26.0–34.0)
MCHC: 32.9 g/dL (ref 30.0–36.0)
MCV: 101.6 fL — ABNORMAL HIGH (ref 80.0–100.0)
Monocytes Absolute: 0.3 10*3/uL (ref 0.1–1.0)
Monocytes Relative: 6 %
Neutro Abs: 3.6 10*3/uL (ref 1.7–7.7)
Neutrophils Relative %: 80 %
Platelet Count: 164 10*3/uL (ref 150–400)
RBC: 2.48 MIL/uL — ABNORMAL LOW (ref 4.22–5.81)
RDW: 21.7 % — ABNORMAL HIGH (ref 11.5–15.5)
WBC Count: 4.5 10*3/uL (ref 4.0–10.5)
nRBC: 0 % (ref 0.0–0.2)

## 2021-07-26 LAB — CMP (CANCER CENTER ONLY)
ALT: 36 U/L (ref 0–44)
AST: 16 U/L (ref 15–41)
Albumin: 3.5 g/dL (ref 3.5–5.0)
Alkaline Phosphatase: 63 U/L (ref 38–126)
Anion gap: 5 (ref 5–15)
BUN: 26 mg/dL — ABNORMAL HIGH (ref 8–23)
CO2: 28 mmol/L (ref 22–32)
Calcium: 8.8 mg/dL — ABNORMAL LOW (ref 8.9–10.3)
Chloride: 108 mmol/L (ref 98–111)
Creatinine: 0.88 mg/dL (ref 0.61–1.24)
GFR, Estimated: >60 mL/min (ref >60)
Glucose, Bld: 157 mg/dL — ABNORMAL HIGH (ref 70–99)
Potassium: 3.6 mmol/L (ref 3.5–5.1)
Sodium: 141 mmol/L (ref 135–145)
Total Bilirubin: 0.5 mg/dL (ref 0.3–1.2)
Total Protein: 6.1 g/dL — ABNORMAL LOW (ref 6.5–8.1)

## 2021-07-26 LAB — SAMPLE TO BLOOD BANK

## 2021-07-26 MED ORDER — SODIUM CHLORIDE 0.9% FLUSH
10.0000 mL | Freq: Once | INTRAVENOUS | Status: AC
  Administered 2021-07-26: 10 mL

## 2021-07-26 MED ORDER — PEGFILGRASTIM-CBQV 6 MG/0.6ML ~~LOC~~ SOSY
6.0000 mg | PREFILLED_SYRINGE | Freq: Once | SUBCUTANEOUS | Status: AC
  Administered 2021-07-26: 6 mg via SUBCUTANEOUS
  Filled 2021-07-26: qty 0.6

## 2021-07-26 MED ORDER — HEPARIN SOD (PORK) LOCK FLUSH 100 UNIT/ML IV SOLN
500.0000 [IU] | Freq: Once | INTRAVENOUS | Status: AC
  Administered 2021-07-26: 500 [IU]

## 2021-07-26 MED ORDER — PREDNISONE 20 MG PO TABS: ORAL_TABLET | ORAL | 1 refills | Status: DC

## 2021-07-26 NOTE — Assessment & Plan Note (Signed)
He has recovery of white blood cell count and platelet count His persistent anemia is likely due to a component of anemia chronic illness He does not need transfusion support today I will see him again next week for further follow-up and he will likely need blood transfusion next week

## 2021-07-26 NOTE — Progress Notes (Signed)
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[place]) Overall, he tolerated recent treatment well except for severe pancytopenia requiring transfusion support With recovery of his CBC and improvement of his platelet count, I am hopeful that his bone marrow is recovering He does not need transfusion support today I recommend stopping prednisone therapy I warned his wife and the patient about risk of withdrawal from prednisone I will see him again next week for further follow-up and transfusion support  Deficiency anemia He has recovery of white blood cell count and platelet count His persistent anemia is likely due to a component of anemia chronic illness He does not need transfusion support today I will see him again next week for further follow-up and he will likely need blood transfusion next week  Multiple myeloma not having achieved remission Henry [name]) The patient has concurrent diagnosis of multiple myeloma I plan to check myeloma panel every 3 months  No orders of the defined types were placed in this encounter.   All questions were answered. The patient knows to call the clinic with any problems, questions or concerns. The total time spent in the appointment was 20 minutes encounter with patients including review of chart and various tests results, discussions about plan of care and coordination of care plan   Henry Lark, MD 07/26/2021 11:08 AM  INTERVAL HISTORY: Please see below for problem oriented charting. he returns for treatment follow-up with his wife Henry [name] is doing well He is gaining weight No recent bleeding Overall, he tolerated recent chemo well Denies recent constipation The patient denies any recent signs or symptoms of bleeding such as spontaneous  epistaxis, hematuria or hematochezia.   REVIEW OF SYSTEMS:   Constitutional: Denies fevers, chills or abnormal weight loss Eyes: Denies blurriness of vision Ears, nose, mouth, throat, and face: Denies mucositis or sore throat Respiratory: Denies cough, dyspnea or wheezes Cardiovascular: Denies palpitation, chest discomfort or lower extremity swelling Gastrointestinal:  Denies nausea, heartburn or change in bowel habits Skin: Denies abnormal skin rashes Lymphatics: Denies new lymphadenopathy or easy bruising Neurological:Denies numbness, tingling or new weaknesses Behavioral/Psych: Mood is stable, no new changes  All other systems were reviewed with the patient and are negative.  I have reviewed the past medical history, past surgical history, social history and family history with the patient and they are unchanged from previous note.  ALLERGIES:  has No Known Allergies.  MEDICATIONS:  Current Outpatient Medications  Medication Sig Dispense Refill   acetaminophen (TYLENOL) 325 MG tablet Take 650 mg by mouth every 6 (six) hours as needed for moderate pain.     acyclovir (ZOVIRAX) 400 MG tablet Take 1 tablet (400 mg total) by mouth daily. 30 tablet 6   atorvastatin (LIPITOR) 40 MG tablet TAKE ONE (1) TABLET BY MOUTH EVERY DAY AT 6PM. (Patient taking differently: Take 20 mg by mouth every other day.) 30 tablet 5   B Complex-Folic Acid (B COMPLEX PLUS) TABS Take 1 tablet by mouth daily. 100 tablet 3   Cholecalciferol (VITAMIN D3) 50 MCG (2000 UT) capsule Take 1 capsule (2,000 Units total) by mouth daily. 100 capsule 3   donepezil (ARICEPT) 10 MG tablet Take 1 tablet (10 mg total) by mouth at bedtime. 90 tablet 3   lidocaine-prilocaine (EMLA) cream Apply to affected area once 30 g 3  megestrol (MEGACE) 40 MG tablet Take 1 tablet (40 mg total) by mouth 2 (two) times daily. 60 tablet 5   memantine (NAMENDA) 10 MG tablet TAKE 1 TABLET BY MOUTH TWICE DAILY. 60 tablet 3   Misc Natural Products  (PROSTATE HEALTH) CAPS Take 1 capsule by mouth in the morning and at bedtime.     ondansetron (ZOFRAN) 8 MG tablet Take 1 tablet (8 mg total) by mouth 2 (two) times daily as needed for refractory nausea / vomiting. Start on day 3 after cyclophosphamide chemotherapy. 30 tablet 1   polyethylene glycol (MIRALAX) 17 g packet Take 17 g by mouth daily. 30 each 1   predniSONE (DELTASONE) 20 MG tablet Take 40 mg by mouth for 4 days after chemo, every 3 weeks 60 tablet 1   prochlorperazine (COMPAZINE) 10 MG tablet Take 1 tablet (10 mg total) by mouth every 6 (six) hours as needed (Nausea or vomiting). 30 tablet 6   QUEtiapine (SEROQUEL) 25 MG tablet Take 0.5 tablets (12.5 mg total) by mouth at bedtime. 90 tablet 1   tamsulosin (FLOMAX) 0.4 MG CAPS capsule Take 0.4 mg by mouth daily.     No current facility-administered medications for this visit.    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 No evidence for primary malignancy or metastatic disease in the chest, abdomen, or pelvis. 2. Subtle edema/inflammation in the sigmoid mesocolon in the region of an ill-defined diverticulum. Imaging features consistent with sigmoid diverticulitis. No perforation or abscess. 3. Prostatomegaly with evidence of median lobe hypertrophy generating mass-effect on the inferior bladder. 4. Nonacute left-sided rib fractures. 5. 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 6. Aortic Atherosclerosis (ICD10-I70.0) and Emphysema (ICD10-J43.9).   06/14/2021 Bone Marrow Biopsy   BONE MARROW, ASPIRATE, CLOT, CORE:  -Hypercellular bone marrow with extensive involvement by a B-cell lymphoproliferative disorder  -Monoclonal plasma cell population identified  -See comment    PERIPHERAL BLOOD:  -Pancytopenia   COMMENT:   The bone marrow shows extensive involvement by a high-grade B-cell lymphoproliferative disorder displaying dim surface lambda light chain expression and bright CD45 consistent with involvement by high-grade B-cell lymphoma.  In addition, there is an admixed monoclonal plasma cell population representing 20% of all cells in the aspirate and displaying kappa light chain restriction consistent with a neoplastic process.  Since the lymphoid and the plasma cell components have different light chain expression, they appear unrelated and may represent two distinct disease processes.  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 Catheter tip at the superior cavoatrial junction.   07/01/2021 Echocardiogram   1. Hypokinesis of the basal inferolateral wall with overall low normal LV function.  2. Left ventricular ejection fraction, by estimation, is 50 to 55%. The left ventricle has low normal function. The left ventricle demonstrates regional wall motion abnormalities (see scoring diagram/findings for description). There is mild left ventricular hypertrophy of the basal-septal segment. Left  ventricular diastolic parameters are consistent with Grade I diastolic dysfunction (impaired relaxation). Elevated left atrial pressure.  3. Right ventricular systolic function is normal. The right ventricular size is normal.  4. The mitral valve is normal in structure. Mild mitral valve regurgitation. No evidence of mitral stenosis.  5. The aortic valve is tricuspid. Aortic  valve regurgitation is trivial. Aortic valve sclerosis is present, with no evidence of aortic valve stenosis.  6. Aortic dilatation noted. There is mild dilatation of the aortic root, measuring 41 mm.     07/02/2021 -  Chemotherapy   Patient is on Treatment Plan : NON-HODGKINS LYMPHOMA R-CHOP q21d      Multiple myeloma not having achieved remission ([place])  06/18/2021 Initial Diagnosis   Multiple myeloma not having achieved remission ([place])    06/18/2021 Cancer Staging   Staging form: Plasma Cell Myeloma and Plasma Cell Disorders, AJCC 8th Edition - Clinical stage from 06/18/2021: Albumin (g/dL): 3.1, ISS: Stage II, High-risk cytogenetics: Unknown, LDH: Elevated - Signed by Henry Lark, MD on 06/18/2021 Stage prefix: Initial diagnosis Albumin range (g/dL): Less than 3.5 Cytogenetics: Unknown      PHYSICAL EXAMINATION: ECOG PERFORMANCE STATUS: 2 - Symptomatic, <50% confined to bed  Vitals:   07/26/21 1006  BP: 93/67  Pulse: (!) 110  Resp: 18  Temp: 98 F (36.7 C)  SpO2: 100%   Filed Weights   07/26/21 1006  Weight: 145 lb 9.6 oz (66 kg)    GENERAL:alert, no distress and comfortable NEURO: alert & oriented x 3 with fluent speech, no focal motor/sensory deficits  LABORATORY DATA:  I have reviewed the data as listed    Component Value Date/Time   NA 141 07/26/2021 0952   K 3.6 07/26/2021 0952   CL 108 07/26/2021 0952   CO2 28 07/26/2021 0952   GLUCOSE 157 (H) 07/26/2021 0952   BUN 26 (H) 07/26/2021 0952   CREATININE 0.88 07/26/2021 0952   CALCIUM 8.8 (L) 07/26/2021 0952   PROT 6.1 (L) 07/26/2021 0952   ALBUMIN 3.5 07/26/2021 0952   AST 16 07/26/2021 0952   ALT 36 07/26/2021 0952   ALKPHOS 63 07/26/2021 0952   BILITOT 0.5 07/26/2021 0952   GFRNONAA >60 07/26/2021 0952   GFRAA >60 04/25/2019 0528    No results found for: SPEP, UPEP  Lab Results  Component Value Date   WBC 4.5 07/26/2021   NEUTROABS 3.6 07/26/2021   HGB 8.3 (L) 07/26/2021   HCT 25.2 (L)  07/26/2021   MCV 101.6 (H) 07/26/2021   PLT 164 07/26/2021      Chemistry      Component Value Date/Time   NA 141 07/26/2021 0952   K 3.6 07/26/2021 0952   CL 108 07/26/2021 0952   CO2 28 07/26/2021 0952   BUN 26 (H) 07/26/2021 0952   CREATININE 0.88 07/26/2021 0952      Component Value Date/Time   CALCIUM 8.8 (L) 07/26/2021 0952   ALKPHOS 63 07/26/2021 0952   AST 16 07/26/2021 0952   ALT 36 07/26/2021 0952   BILITOT 0.5 07/26/2021 1448

## 2021-07-26 NOTE — Assessment & Plan Note (Signed)
The patient has concurrent diagnosis of multiple myeloma I plan to check myeloma panel every 3 months

## 2021-07-26 NOTE — Assessment & Plan Note (Signed)
Overall, he tolerated recent treatment well except for severe pancytopenia requiring transfusion support With recovery of his CBC and improvement of his platelet count, I am hopeful that his bone marrow is recovering He does not need transfusion support today I recommend stopping prednisone therapy I warned his wife and the patient about risk of withdrawal from prednisone I will see him again next week for further follow-up and transfusion support

## 2021-07-26 NOTE — Patient Instructions (Signed)

## 2021-07-29 ENCOUNTER — Other Ambulatory Visit: Payer: Self-pay | Admitting: Hematology and Oncology

## 2021-08-05 ENCOUNTER — Other Ambulatory Visit: Payer: Self-pay

## 2021-08-05 ENCOUNTER — Inpatient Hospital Stay: Payer: Medicare HMO | Attending: Hematology and Oncology

## 2021-08-05 ENCOUNTER — Inpatient Hospital Stay (HOSPITAL_BASED_OUTPATIENT_CLINIC_OR_DEPARTMENT_OTHER): Payer: Medicare HMO | Admitting: Hematology and Oncology

## 2021-08-05 ENCOUNTER — Inpatient Hospital Stay: Payer: Medicare HMO

## 2021-08-05 ENCOUNTER — Encounter: Payer: Self-pay | Admitting: Hematology and Oncology

## 2021-08-05 VITALS — BP 104/66 | HR 119 | Temp 97.7°F | Resp 18 | Ht 69.0 in | Wt 145.0 lb

## 2021-08-05 DIAGNOSIS — D539 Nutritional anemia, unspecified: Secondary | ICD-10-CM

## 2021-08-05 DIAGNOSIS — Z5111 Encounter for antineoplastic chemotherapy: Secondary | ICD-10-CM | POA: Insufficient documentation

## 2021-08-05 DIAGNOSIS — F03918 Unspecified dementia, unspecified severity, with other behavioral disturbance: Secondary | ICD-10-CM | POA: Diagnosis not present

## 2021-08-05 DIAGNOSIS — C8510 Unspecified B-cell lymphoma, unspecified site: Secondary | ICD-10-CM | POA: Insufficient documentation

## 2021-08-05 DIAGNOSIS — Z5189 Encounter for other specified aftercare: Secondary | ICD-10-CM | POA: Diagnosis not present

## 2021-08-05 DIAGNOSIS — C9000 Multiple myeloma not having achieved remission: Secondary | ICD-10-CM | POA: Diagnosis not present

## 2021-08-05 DIAGNOSIS — Z5112 Encounter for antineoplastic immunotherapy: Secondary | ICD-10-CM | POA: Diagnosis not present

## 2021-08-05 LAB — CMP (CANCER CENTER ONLY)
ALT: 18 U/L (ref 0–44)
AST: 19 U/L (ref 15–41)
Albumin: 3.5 g/dL (ref 3.5–5.0)
Alkaline Phosphatase: 70 U/L (ref 38–126)
Anion gap: 5 (ref 5–15)
BUN: 15 mg/dL (ref 8–23)
CO2: 27 mmol/L (ref 22–32)
Calcium: 9.1 mg/dL (ref 8.9–10.3)
Chloride: 107 mmol/L (ref 98–111)
Creatinine: 0.73 mg/dL (ref 0.61–1.24)
GFR, Estimated: >60 mL/min (ref >60)
Glucose, Bld: 192 mg/dL — ABNORMAL HIGH (ref 70–99)
Potassium: 3.7 mmol/L (ref 3.5–5.1)
Sodium: 139 mmol/L (ref 135–145)
Total Bilirubin: 0.5 mg/dL (ref 0.3–1.2)
Total Protein: 6 g/dL — ABNORMAL LOW (ref 6.5–8.1)

## 2021-08-05 LAB — CBC WITH DIFFERENTIAL (CANCER CENTER ONLY)
Abs Immature Granulocytes: 0.05 10*3/uL (ref 0.00–0.07)
Basophils Absolute: 0 10*3/uL (ref 0.0–0.1)
Basophils Relative: 0 %
Eosinophils Absolute: 0.1 10*3/uL (ref 0.0–0.5)
Eosinophils Relative: 1 %
HCT: 23.8 % — ABNORMAL LOW (ref 39.0–52.0)
Hemoglobin: 7.7 g/dL — ABNORMAL LOW (ref 13.0–17.0)
Immature Granulocytes: 1 %
Lymphocytes Relative: 9 %
Lymphs Abs: 0.5 10*3/uL — ABNORMAL LOW (ref 0.7–4.0)
MCH: 34.5 pg — ABNORMAL HIGH (ref 26.0–34.0)
MCHC: 32.4 g/dL (ref 30.0–36.0)
MCV: 106.7 fL — ABNORMAL HIGH (ref 80.0–100.0)
Monocytes Absolute: 0.5 10*3/uL (ref 0.1–1.0)
Monocytes Relative: 8 %
Neutro Abs: 4.4 10*3/uL (ref 1.7–7.7)
Neutrophils Relative %: 81 %
Platelet Count: 129 10*3/uL — ABNORMAL LOW (ref 150–400)
RBC: 2.23 MIL/uL — ABNORMAL LOW (ref 4.22–5.81)
RDW: 26.3 % — ABNORMAL HIGH (ref 11.5–15.5)
WBC Count: 5.5 10*3/uL (ref 4.0–10.5)
nRBC: 0 % (ref 0.0–0.2)

## 2021-08-05 LAB — SAMPLE TO BLOOD BANK

## 2021-08-05 LAB — PREPARE RBC (CROSSMATCH)

## 2021-08-05 MED ORDER — DIPHENHYDRAMINE HCL 25 MG PO CAPS
25.0000 mg | ORAL_CAPSULE | Freq: Once | ORAL | Status: AC
  Administered 2021-08-05: 25 mg via ORAL
  Filled 2021-08-05: qty 1

## 2021-08-05 MED ORDER — ACETAMINOPHEN 325 MG PO TABS
650.0000 mg | ORAL_TABLET | Freq: Once | ORAL | Status: AC
  Administered 2021-08-05: 650 mg via ORAL
  Filled 2021-08-05: qty 2

## 2021-08-05 MED ORDER — SODIUM CHLORIDE 0.9% IV SOLUTION
250.0000 mL | Freq: Once | INTRAVENOUS | Status: AC
  Administered 2021-08-05: 250 mL via INTRAVENOUS

## 2021-08-05 MED ORDER — SODIUM CHLORIDE 0.9% FLUSH
10.0000 mL | Freq: Once | INTRAVENOUS | Status: AC
  Administered 2021-08-05: 10 mL

## 2021-08-05 NOTE — Assessment & Plan Note (Signed)
He is doing well so far His wife is taking good care of him He has no recent confusion episodes

## 2021-08-05 NOTE — Assessment & Plan Note (Signed)
He has recovery of white blood cell count and platelet count His persistent anemia is likely due to a component of anemia chronic illness He is needing less blood transfusion support I am hopeful that his bone marrow is recovering and he is responding to treatment We discussed some of the risks, benefits, and alternatives of blood transfusions. The patient is symptomatic from anemia and the hemoglobin level is critically low.  Some of the side-effects to be expected including risks of transfusion reactions, chills, infection, syndrome of volume overload and risk of hospitalization from various reasons and the patient is willing to proceed and went ahead to sign consent today.

## 2021-08-05 NOTE — Assessment & Plan Note (Signed)
The patient has concurrent diagnosis of multiple myeloma I plan to check myeloma panel every 3 months

## 2021-08-05 NOTE — Patient Instructions (Signed)
Blood Transfusion, Adult, Care After This sheet gives you information about how to care for yourself after your procedure. Your doctor may also give you more specific instructions. If you have problems or questions, contact your doctor. What can I expect after the procedure? After the procedure, it is common to have: Bruising and soreness at the IV site. A headache. Follow these instructions at home: Insertion site care     Follow instructions from your doctor about how to take care of your insertion site. This is where an IV tube was put into your vein. Make sure you: Wash your hands with soap and water before and after you change your bandage (dressing). If you cannot use soap and water, use hand sanitizer. Change your bandage as told by your doctor. Check your insertion site every day for signs of infection. Check for: Redness, swelling, or pain. Bleeding from the site. Warmth. Pus or a bad smell. General instructions Take over-the-counter and prescription medicines only as told by your doctor. Rest as told by your doctor. Go back to your normal activities as told by your doctor. Keep all follow-up visits as told by your doctor. This is important. Contact a doctor if: You have itching or red, swollen areas of skin (hives). You feel worried or nervous (anxious). You feel weak after doing your normal activities. You have redness, swelling, warmth, or pain around the insertion site. You have blood coming from the insertion site, and the blood does not stop with pressure. You have pus or a bad smell coming from the insertion site. Get help right away if: You have signs of a serious reaction. This may be coming from an allergy or the body's defense system (immune system). Signs include: Trouble breathing or shortness of breath. Swelling of the face or feeling warm (flushed). Fever or chills. Head, chest, or back pain. Dark pee (urine) or blood in the pee. Widespread rash. Fast  heartbeat. Feeling dizzy or light-headed. You may receive your blood transfusion in an outpatient setting. If so, you will be told whom to contact to report any reactions. These symptoms may be an emergency. Do not wait to see if the symptoms will go away. Get medical help right away. Call your local emergency services (911 in the [place] Do not drive yourself to the hospital. Summary Bruising and soreness at the IV site are common. Check your insertion site every day for signs of infection. Rest as told by your doctor. Go back to your normal activities as told by your doctor. Get help right away if you have signs of a serious reaction. This information is not intended to replace advice given to you by your health care provider. Make sure you discuss any questions you have with your health care provider. Document Revised: 06/18/2020 Document Reviewed: 08/16/2018 Elsevier Patient Education  2023 Else[employer] Inc[employer]  

## 2021-08-05 NOTE — Progress Notes (Signed)
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  He will receive 10 mg prednisone tomorrow, skipping today's dose and another dose on Sunday He will not take prednisone again until his next cycle of treatment  Multiple myeloma not having achieved remission ([place]) The patient has concurrent diagnosis of multiple myeloma I plan to check myeloma panel every 3 months  Deficiency anemia He has recovery of white blood cell count and platelet count His persistent anemia is likely due to a component of anemia chronic illness He is needing less blood transfusion support I am hopeful that his bone marrow is recovering and he is responding to treatment We discussed some of the risks, benefits, and alternatives of blood transfusions. The patient is symptomatic from anemia and the hemoglobin level is critically low.  Some of the side-effects to be expected including risks of transfusion reactions, chills, infection, syndrome of volume overload and risk of hospitalization from various reasons and the patient is willing to proceed and went ahead to sign consent today.   Dementia with behavioral disturbance [facility]) He is doing well so far His wife is taking good care of him He has no recent confusion episodes  Orders Placed This Encounter  Procedures   Care order/instruction    Transfuse Parameters    Standing Status:   Future    Standing Expiration  Date:   08/05/2022   Informed Consent Details: Physician/Practitioner Attestation; Transcribe to consent form and obtain patient signature    Standing Status:   Future    Standing Expiration Date:   08/06/2022    Order Specific Question:   Physician/Practitioner attestation of informed consent for blood and or blood product transfusion    Answer:   I, the physician/practitioner, attest that I have discussed with the patient the benefits, risks, side effects, alternatives, likelihood of achieving goals and potential problems during recovery for the procedure that I have provided informed consent.    Order Specific Question:   Product(s)    Answer:   All Product(s)   Type and screen         Standing Status:   Future    Number of Occurrences:   1    Standing Expiration Date:   08/06/2022   Prepare RBC (crossmatch)    Standing Status:   Standing    Number of Occurrences:   1    Order Specific Question:   # of Units    Answer:   1 unit    Order Specific Question:   Transfusion Indications    Answer:   Symptomatic Anemia    Order Specific Question:   Special Requirements    Answer:   Irradiated-IRR    Order Specific Question:   Number of Units to Keep Ahead    Answer:   NO units ahead    Order Specific Question:   Instructions:    Answer:   Transfuse    Order Specific Question:   If emergent release call blood bank  Answer:   Not emergent release    All questions were answered. The patient knows to call the clinic with any problems, questions or concerns. The total time spent in the appointment was 30 minutes encounter with patients including review of chart and various tests results, discussions about plan of care and coordination of care plan   Heath Lark, MD 08/05/2021 11:43 AM  INTERVAL HISTORY: Please see below for problem oriented charting. he returns for treatment follow-up with his wife He tolerated recent cycle of treatment well He complains of fatigue His wife noted some skin  changes on his scalp and his hand He denies recent bleeding episodes  REVIEW OF SYSTEMS:   Constitutional: Denies fevers, chills or abnormal weight loss Eyes: Denies blurriness of vision Ears, nose, mouth, throat, and face: Denies mucositis or sore throat Respiratory: Denies cough, dyspnea or wheezes Cardiovascular: Denies palpitation, chest discomfort or lower extremity swelling Gastrointestinal:  Denies nausea, heartburn or change in bowel habits Lymphatics: Denies new lymphadenopathy  Neurological:Denies numbness, tingling or new weaknesses Behavioral/Psych: Mood is stable, no new changes  All other systems were reviewed with the patient and are negative.  I have reviewed the past medical history, past surgical history, social history and family history with the patient and they are unchanged from previous note.  ALLERGIES:  has No Known Allergies.  MEDICATIONS:  Current Outpatient Medications  Medication Sig Dispense Refill   acetaminophen (TYLENOL) 325 MG tablet Take 650 mg by mouth every 6 (six) hours as needed for moderate pain.     acyclovir (ZOVIRAX) 400 MG tablet Take 1 tablet (400 mg total) by mouth daily. 30 tablet 6   atorvastatin (LIPITOR) 40 MG tablet TAKE ONE (1) TABLET BY MOUTH EVERY DAY AT 6PM. (Patient taking differently: Take 20 mg by mouth every other day.) 30 tablet 5   B Complex-Folic Acid (B COMPLEX PLUS) TABS Take 1 tablet by mouth daily. 100 tablet 3   Cholecalciferol (VITAMIN D3) 50 MCG (2000 UT) capsule Take 1 capsule (2,000 Units total) by mouth daily. 100 capsule 3   donepezil (ARICEPT) 10 MG tablet Take 1 tablet (10 mg total) by mouth at bedtime. 90 tablet 3   lidocaine-prilocaine (EMLA) cream Apply to affected area once 30 g 3   megestrol (MEGACE) 40 MG tablet Take 1 tablet (40 mg total) by mouth 2 (two) times daily. 60 tablet 5   memantine (NAMENDA) 10 MG tablet TAKE 1 TABLET BY MOUTH TWICE DAILY. 60 tablet 3   Misc Natural Products (PROSTATE HEALTH)  CAPS Take 1 capsule by mouth in the morning and at bedtime.     ondansetron (ZOFRAN) 8 MG tablet Take 1 tablet (8 mg total) by mouth 2 (two) times daily as needed for refractory nausea / vomiting. Start on day 3 after cyclophosphamide chemotherapy. 30 tablet 1   polyethylene glycol (MIRALAX) 17 g packet Take 17 g by mouth daily. 30 each 1   predniSONE (DELTASONE) 20 MG tablet Take 40 mg by mouth for 4 days after chemo, every 3 weeks 60 tablet 1   prochlorperazine (COMPAZINE) 10 MG tablet Take 1 tablet (10 mg total) by mouth every 6 (six) hours as needed (Nausea or vomiting). 30 tablet 6   QUEtiapine (SEROQUEL) 25 MG tablet Take 0.5 tablets (12.5 mg total) by mouth at bedtime. 90 tablet 1   tamsulosin (FLOMAX) 0.4 MG CAPS capsule Take 0.4 mg by mouth daily.     No current facility-administered medications for this visit.    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 No evidence for primary malignancy or metastatic disease in the chest, abdomen, or pelvis. 2. Subtle edema/inflammation in the sigmoid mesocolon in the region of an ill-defined diverticulum. Imaging features consistent with sigmoid diverticulitis. No perforation or abscess. 3. Prostatomegaly with evidence of median lobe hypertrophy generating mass-effect on the inferior bladder. 4. Nonacute left-sided rib fractures. 5. 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 6. Aortic Atherosclerosis (ICD10-I70.0) and Emphysema (ICD10-J43.9).   06/14/2021 Bone Marrow Biopsy   BONE MARROW, ASPIRATE, CLOT, CORE:  -Hypercellular bone marrow with extensive involvement by a B-cell lymphoproliferative disorder  -Monoclonal plasma cell population identified  -See comment   PERIPHERAL BLOOD:   -Pancytopenia   COMMENT:   The bone marrow shows extensive involvement by a high-grade B-cell lymphoproliferative disorder displaying dim surface lambda light chain expression and bright CD45 consistent with involvement by high-grade B-cell lymphoma.  In addition, there is an admixed monoclonal plasma cell population representing 20% of all cells in the aspirate and displaying kappa light chain restriction consistent with a neoplastic process.  Since the lymphoid and the plasma cell components have different light chain expression, they appear unrelated and may represent two distinct disease processes.  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 Catheter tip at the superior cavoatrial junction.   07/01/2021 Echocardiogram   1. Hypokinesis of the basal inferolateral wall with overall low normal LV function.  2. Left ventricular ejection fraction, by estimation, is 50 to 55%. The left ventricle has low normal function. The left ventricle demonstrates regional wall motion abnormalities (see scoring diagram/findings for description). There is mild left ventricular hypertrophy of the basal-septal segment. Left ventricular diastolic parameters are consistent with Grade I diastolic dysfunction (impaired relaxation). Elevated left atrial pressure.  3. Right ventricular systolic function is normal. The right ventricular size is normal.  4. The mitral valve is normal in structure. Mild mitral valve regurgitation. No evidence of mitral stenosis.  5. The aortic valve is tricuspid. Aortic valve regurgitation is  trivial. Aortic valve sclerosis is present, with no evidence of aortic valve stenosis.  6. Aortic dilatation noted. There is mild dilatation of the aortic root, measuring 41 mm.     07/02/2021 -  Chemotherapy   Patient is on Treatment Plan : NON-HODGKINS LYMPHOMA R-CHOP q21d      Multiple myeloma not having achieved remission ([place])  06/18/2021 Initial Diagnosis   Multiple myeloma not having achieved remission ([place])    06/18/2021 Cancer Staging   Staging form: Plasma Cell Myeloma and Plasma Cell Disorders, AJCC 8th Edition - Clinical stage from 06/18/2021: Albumin (g/dL): 3.1, ISS: Stage II, High-risk cytogenetics: Unknown, LDH: Elevated - Signed by Heath Lark, MD on 06/18/2021 Stage prefix: Initial diagnosis Albumin range (g/dL): Less than 3.5 Cytogenetics: Unknown      PHYSICAL EXAMINATION: ECOG PERFORMANCE STATUS: 2 - Symptomatic, <50% confined to bed  Vitals:  08/05/21 1119  BP: 104/66  Pulse: (!) 119  Resp: 18  Temp: 97.7 F (36.5 C)  SpO2: 97%   Filed Weights   08/05/21 1119  Weight: 145 lb (65.8 kg)    GENERAL:alert, no distress and comfortable SKIN: Noted skin support keratosis and bruising NEURO: alert & oriented x 3 with fluent speech  LABORATORY DATA:  I have reviewed the data as listed    Component Value Date/Time   NA 139 08/05/2021 1057   K 3.7 08/05/2021 1057   CL 107 08/05/2021 1057   CO2 27 08/05/2021 1057   GLUCOSE 192 (H) 08/05/2021 1057   BUN 15 08/05/2021 1057   CREATININE 0.73 08/05/2021 1057   CALCIUM 9.1 08/05/2021 1057   PROT 6.0 (L) 08/05/2021 1057   ALBUMIN 3.5 08/05/2021 1057   AST 19 08/05/2021 1057   ALT 18 08/05/2021 1057   ALKPHOS 70 08/05/2021 1057   BILITOT 0.5 08/05/2021 1057   GFRNONAA >60 08/05/2021 1057   GFRAA >60 04/25/2019 0528    No results found for: SPEP, UPEP  Lab Results  Component Value Date   WBC 5.5 08/05/2021   NEUTROABS 4.4 08/05/2021   HGB 7.7 (L) 08/05/2021   HCT 23.8 (L) 08/05/2021   MCV 106.7  (H) 08/05/2021   PLT 129 (L) 08/05/2021      Chemistry      Component Value Date/Time   NA 139 08/05/2021 1057   K 3.7 08/05/2021 1057   CL 107 08/05/2021 1057   CO2 27 08/05/2021 1057   BUN 15 08/05/2021 1057   CREATININE 0.73 08/05/2021 1057      Component Value Date/Time   CALCIUM 9.1 08/05/2021 1057   ALKPHOS 70 08/05/2021 1057   AST 19 08/05/2021 1057   ALT 18 08/05/2021 1057   BILITOT 0.5 08/05/2021 1057

## 2021-08-05 NOTE — Assessment & Plan Note (Addendum)
Overall, he tolerated recent treatment well except for severe pancytopenia requiring transfusion support With recovery of his CBC and improvement of his platelet count, I am hopeful that his bone marrow is recovering He will receive transfusion support today and we will recheck his count prior to cycle 3 of therapy I recommend prednisone taper.  He will receive 10 mg prednisone tomorrow, skipping today's dose and another dose on Sunday He will not take prednisone again until his next cycle of treatment

## 2021-08-06 LAB — TYPE AND SCREEN
ABO/RH(D): O POS
Antibody Screen: NEGATIVE
Unit division: 0

## 2021-08-06 LAB — BPAM RBC
Blood Product Expiration Date: 202306162359
ISSUE DATE / TIME: 202306011306
Unit Type and Rh: 9500

## 2021-08-13 ENCOUNTER — Encounter: Payer: Self-pay | Admitting: Hematology and Oncology

## 2021-08-13 ENCOUNTER — Other Ambulatory Visit: Payer: Self-pay

## 2021-08-13 ENCOUNTER — Inpatient Hospital Stay: Payer: Medicare HMO

## 2021-08-13 ENCOUNTER — Inpatient Hospital Stay (HOSPITAL_BASED_OUTPATIENT_CLINIC_OR_DEPARTMENT_OTHER): Payer: Medicare HMO | Admitting: Hematology and Oncology

## 2021-08-13 VITALS — BP 94/54 | HR 63 | Temp 97.7°F | Resp 17

## 2021-08-13 DIAGNOSIS — I69354 Hemiplegia and hemiparesis following cerebral infarction affecting left non-dominant side: Secondary | ICD-10-CM

## 2021-08-13 DIAGNOSIS — D539 Nutritional anemia, unspecified: Secondary | ICD-10-CM

## 2021-08-13 DIAGNOSIS — C8510 Unspecified B-cell lymphoma, unspecified site: Secondary | ICD-10-CM

## 2021-08-13 DIAGNOSIS — Z5111 Encounter for antineoplastic chemotherapy: Secondary | ICD-10-CM | POA: Diagnosis not present

## 2021-08-13 DIAGNOSIS — Z5189 Encounter for other specified aftercare: Secondary | ICD-10-CM | POA: Diagnosis not present

## 2021-08-13 DIAGNOSIS — C9000 Multiple myeloma not having achieved remission: Secondary | ICD-10-CM

## 2021-08-13 DIAGNOSIS — Z5112 Encounter for antineoplastic immunotherapy: Secondary | ICD-10-CM | POA: Diagnosis not present

## 2021-08-13 LAB — CBC WITH DIFFERENTIAL (CANCER CENTER ONLY)
Abs Immature Granulocytes: 0.03 10*3/uL (ref 0.00–0.07)
Basophils Absolute: 0.1 10*3/uL (ref 0.0–0.1)
Basophils Relative: 1 %
Eosinophils Absolute: 0.3 10*3/uL (ref 0.0–0.5)
Eosinophils Relative: 4 %
HCT: 29.3 % — ABNORMAL LOW (ref 39.0–52.0)
Hemoglobin: 9.6 g/dL — ABNORMAL LOW (ref 13.0–17.0)
Immature Granulocytes: 1 %
Lymphocytes Relative: 10 %
Lymphs Abs: 0.6 10*3/uL — ABNORMAL LOW (ref 0.7–4.0)
MCH: 35 pg — ABNORMAL HIGH (ref 26.0–34.0)
MCHC: 32.8 g/dL (ref 30.0–36.0)
MCV: 106.9 fL — ABNORMAL HIGH (ref 80.0–100.0)
Monocytes Absolute: 1 10*3/uL (ref 0.1–1.0)
Monocytes Relative: 16 %
Neutro Abs: 4.4 10*3/uL (ref 1.7–7.7)
Neutrophils Relative %: 68 %
Platelet Count: 207 10*3/uL (ref 150–400)
RBC: 2.74 MIL/uL — ABNORMAL LOW (ref 4.22–5.81)
RDW: 25.1 % — ABNORMAL HIGH (ref 11.5–15.5)
WBC Count: 6.3 10*3/uL (ref 4.0–10.5)
nRBC: 0 % (ref 0.0–0.2)

## 2021-08-13 LAB — CMP (CANCER CENTER ONLY)
ALT: 21 U/L (ref 0–44)
AST: 23 U/L (ref 15–41)
Albumin: 3.7 g/dL (ref 3.5–5.0)
Alkaline Phosphatase: 77 U/L (ref 38–126)
Anion gap: 6 (ref 5–15)
BUN: 18 mg/dL (ref 8–23)
CO2: 27 mmol/L (ref 22–32)
Calcium: 9.9 mg/dL (ref 8.9–10.3)
Chloride: 106 mmol/L (ref 98–111)
Creatinine: 0.8 mg/dL (ref 0.61–1.24)
GFR, Estimated: >60 mL/min (ref >60)
Glucose, Bld: 184 mg/dL — ABNORMAL HIGH (ref 70–99)
Potassium: 3.6 mmol/L (ref 3.5–5.1)
Sodium: 139 mmol/L (ref 135–145)
Total Bilirubin: 0.5 mg/dL (ref 0.3–1.2)
Total Protein: 6.6 g/dL (ref 6.5–8.1)

## 2021-08-13 LAB — SAMPLE TO BLOOD BANK

## 2021-08-13 MED ORDER — SODIUM CHLORIDE 0.9 % IV SOLN
150.0000 mg | Freq: Once | INTRAVENOUS | Status: AC
  Administered 2021-08-13: 150 mg via INTRAVENOUS
  Filled 2021-08-13: qty 150

## 2021-08-13 MED ORDER — DOXORUBICIN HCL CHEMO IV INJECTION 2 MG/ML
25.0000 mg/m2 | Freq: Once | INTRAVENOUS | Status: AC
  Administered 2021-08-13: 44 mg via INTRAVENOUS
  Filled 2021-08-13: qty 22

## 2021-08-13 MED ORDER — VINCRISTINE SULFATE CHEMO INJECTION 1 MG/ML
1.0000 mg | Freq: Once | INTRAVENOUS | Status: AC
  Administered 2021-08-13: 1 mg via INTRAVENOUS
  Filled 2021-08-13: qty 1

## 2021-08-13 MED ORDER — SODIUM CHLORIDE 0.9% FLUSH
10.0000 mL | INTRAVENOUS | Status: DC | PRN
  Administered 2021-08-13: 10 mL

## 2021-08-13 MED ORDER — SODIUM CHLORIDE 0.9 % IV SOLN
10.0000 mg | Freq: Once | INTRAVENOUS | Status: AC
  Administered 2021-08-13: 10 mg via INTRAVENOUS
  Filled 2021-08-13: qty 10

## 2021-08-13 MED ORDER — PALONOSETRON HCL INJECTION 0.25 MG/5ML
0.2500 mg | Freq: Once | INTRAVENOUS | Status: AC
  Administered 2021-08-13: 0.25 mg via INTRAVENOUS
  Filled 2021-08-13: qty 5

## 2021-08-13 MED ORDER — SODIUM CHLORIDE 0.9 % IV SOLN
375.0000 mg/m2 | Freq: Once | INTRAVENOUS | Status: AC
  Administered 2021-08-13: 660 mg via INTRAVENOUS
  Filled 2021-08-13: qty 33

## 2021-08-13 MED ORDER — HEPARIN SOD (PORK) LOCK FLUSH 100 UNIT/ML IV SOLN
500.0000 [IU] | Freq: Once | INTRAVENOUS | Status: AC | PRN
  Administered 2021-08-13: 500 [IU]

## 2021-08-13 MED ORDER — SODIUM CHLORIDE 0.9 % IV SOLN
375.0000 mg/m2 | Freq: Once | INTRAVENOUS | Status: AC
  Administered 2021-08-13: 700 mg via INTRAVENOUS
  Filled 2021-08-13: qty 50

## 2021-08-13 MED ORDER — DIPHENHYDRAMINE HCL 25 MG PO CAPS
25.0000 mg | ORAL_CAPSULE | Freq: Once | ORAL | Status: AC
  Administered 2021-08-13: 25 mg via ORAL
  Filled 2021-08-13: qty 1

## 2021-08-13 MED ORDER — SODIUM CHLORIDE 0.9 % IV SOLN: Freq: Once | INTRAVENOUS | Status: AC

## 2021-08-13 MED ORDER — ACETAMINOPHEN 325 MG PO TABS
650.0000 mg | ORAL_TABLET | Freq: Once | ORAL | Status: AC
  Administered 2021-08-13: 650 mg via ORAL
  Filled 2021-08-13: qty 2

## 2021-08-13 MED ORDER — SODIUM CHLORIDE 0.9% FLUSH
10.0000 mL | Freq: Once | INTRAVENOUS | Status: AC
  Administered 2021-08-13: 10 mL

## 2021-08-13 NOTE — Assessment & Plan Note (Signed)
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

## 2021-08-13 NOTE — Assessment & Plan Note (Signed)
He has recovery of white blood cell count and platelet count His persistent anemia is likely due to a component of anemia chronic illness He is needing less blood transfusion support and he does not need blood transfusion today I am hopeful that his bone marrow is recovering and he is responding to treatment

## 2021-08-13 NOTE — Progress Notes (Signed)
Per Dr. Alvy Bimler OK to proceed w/ elevated HR today

## 2021-08-13 NOTE — Assessment & Plan Note (Signed)
Overall, he tolerated recent treatment well except for severe pancytopenia requiring transfusion support With recovery of his CBC and improvement of his platelet count, I am certain that the patient is responding well to treatment Overall, he tolerated prednisone taper well I remind the patient and his wife that he needs to take prednisone daily for the next 4 days after chemotherapy and to return next week for G-CSF support The patient has become transfusion independent now I will see him again in 3 weeks I plan to repeat bone marrow biopsy after 6 cycles of treatment

## 2021-08-13 NOTE — Assessment & Plan Note (Signed)
The patient has concurrent diagnosis of multiple myeloma I plan to check myeloma panel every 3 months

## 2021-08-13 NOTE — Patient Instructions (Signed)
[facility]  Discharge Instructions: Thank you for choosing [facility] to provide your oncology and hematology care.   If you have a lab appointment with the [facility], please go directly to the [facility] and check in at the registration area.   Wear comfortable clothing and clothing appropriate for easy access to any Portacath or PICC line.   We strive to give you quality time with your provider. You may need to reschedule your appointment if you arrive late (15 or more minutes).  Arriving late affects you and other patients whose appointments are after yours.  Also, if you miss three or more appointments without notifying the office, you may be dismissed from the clinic at the provider's discretion.      For prescription refill requests, have your pharmacy contact our office and allow 72 hours for refills to be completed.    Today you received the following chemotherapy and/or immunotherapy agents: Adriamycin/Vincristin/Cytoxan/Ruxience       To help prevent nausea and vomiting after your treatment, we encourage you to take your nausea medication as directed.  BELOW ARE SYMPTOMS THAT SHOULD BE REPORTED IMMEDIATELY: *FEVER GREATER THAN 100.4 F (38 C) OR HIGHER *CHILLS OR SWEATING *NAUSEA AND VOMITING THAT IS NOT CONTROLLED WITH YOUR NAUSEA MEDICATION *UNUSUAL SHORTNESS OF BREATH *UNUSUAL BRUISING OR BLEEDING *URINARY PROBLEMS (pain or burning when urinating, or frequent urination) *BOWEL PROBLEMS (unusual diarrhea, constipation, pain near the anus) TENDERNESS IN MOUTH AND THROAT WITH OR WITHOUT PRESENCE OF ULCERS (sore throat, sores in mouth, or a toothache) UNUSUAL RASH, SWELLING OR PAIN  UNUSUAL VAGINAL DISCHARGE OR ITCHING   Items with * indicate a potential emergency and should be followed up as soon as possible or go to the Emergency Department if any problems should occur.  Please show the CHEMOTHERAPY ALERT CARD or  IMMUNOTHERAPY ALERT CARD at check-in to the Emergency Department and triage nurse.  Should you have questions after your visit or need to cancel or reschedule your appointment, please contact [facility]  Dept: [phone]  and follow the prompts.  Office hours are 8:00 a.m. to 4:30 p.m. Monday - Friday. Please note that voicemails left after 4:00 p.m. may not be returned until the following business day.  We are closed weekends and major holidays. You have access to a nurse at all times for urgent questions. Please call the main number to the clinic Dept: [phone] and follow the prompts.   For any non-urgent questions, you may also contact your provider using MyChart. We now offer e-Visits for anyone 47 and older to request care online for non-urgent symptoms. For details visit [email or web address].   Also download the MyChart app! Go to the app store, search "MyChart", open the app, select [facility], and log in with your MyChart username and password.  Due to Covid, a mask is required upon entering the hospital/clinic. If you do not have a mask, one will be given to you upon arrival. For doctor visits, patients may have 1 support person aged 27 or older with them. For treatment visits, patients cannot have anyone with them due to current Covid guidelines and our immunocompromised population.

## 2021-08-13 NOTE — Progress Notes (Signed)
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[place]) Overall, he tolerated recent treatment well except for severe pancytopenia requiring transfusion support With recovery of his CBC and improvement of his platelet count, I am certain that the patient is responding well to treatment Overall, he tolerated prednisone taper well I remind the patient and his wife that he needs to take prednisone daily for the next 4 days after chemotherapy and to return next week for G-CSF support The patient has become transfusion independent now I will see him again in 3 weeks I plan to repeat bone marrow biopsy after 6 cycles of treatment  Multiple myeloma not having achieved remission [facility]) The patient has concurrent diagnosis of multiple myeloma I plan to check myeloma panel every 3 months  Deficiency anemia He has recovery of white blood cell count and platelet count His persistent anemia is likely due to a component of anemia chronic illness He is needing less blood transfusion support and he does not need blood transfusion today I am hopeful that his bone marrow is recovering and he is responding to treatment   Hemiparesis affecting left side as late effect of stroke [facility]) The patient had residual weakness from stroke and he has dementia His recent weakness is likely due to chemotherapy and recent anemia He will continue medical management  No orders of the defined types were placed in this encounter.   All questions were answered. The patient knows to call the clinic with any problems, questions or concerns. The total time spent in the appointment was 30 minutes encounter with patients including review of chart and various tests results, discussions about plan of care and coordination of  care plan   Heath Lark, MD 08/13/2021 10:24 AM  INTERVAL HISTORY: Please see below for problem oriented charting. he returns for treatment follow-up seen prior to treatment today He has occasional night sweats According to his wife, he has not been eating well and chews a lot of sweet stuff He has been urinating quite a lot and has occasional incontinence His appetite is fair  No recent bleeding  REVIEW OF SYSTEMS:   Constitutional: Denies fevers, chills or abnormal weight loss Eyes: Denies blurriness of vision Ears, nose, mouth, throat, and face: Denies mucositis or sore throat Respiratory: Denies cough, dyspnea or wheezes Cardiovascular: Denies palpitation, chest discomfort or lower extremity swelling Gastrointestinal:  Denies nausea, heartburn or change in bowel habits Skin: Denies abnormal skin rashes Lymphatics: Denies new lymphadenopathy or easy bruising Neurological:Denies numbness, tingling or new weaknesses Behavioral/Psych: Mood is stable, no new changes  All other systems were reviewed with the patient and are negative.  I have reviewed the past medical history, past surgical history, social history and family history with the patient and they are unchanged from previous note.  ALLERGIES:  has No Known Allergies.  MEDICATIONS:  Current Outpatient Medications  Medication Sig Dispense Refill   acetaminophen (TYLENOL) 325 MG tablet Take 650 mg by mouth every 6 (six) hours as needed for moderate pain.     acyclovir (ZOVIRAX) 400 MG tablet Take 1 tablet (400 mg total) by mouth daily. 30 tablet 6   atorvastatin (LIPITOR) 40 MG tablet TAKE ONE (1) TABLET BY MOUTH EVERY DAY AT 6PM. (Patient taking differently: Take 20 mg by mouth every other day.) 30 tablet 5  B Complex-Folic Acid (B COMPLEX PLUS) TABS Take 1 tablet by mouth daily. 100 tablet 3   Cholecalciferol (VITAMIN D3) 50 MCG (2000 UT) capsule Take 1 capsule (2,000 Units total) by mouth daily. 100 capsule 3   donepezil  (ARICEPT) 10 MG tablet Take 1 tablet (10 mg total) by mouth at bedtime. 90 tablet 3   lidocaine-prilocaine (EMLA) cream Apply to affected area once 30 g 3   megestrol (MEGACE) 40 MG tablet Take 1 tablet (40 mg total) by mouth 2 (two) times daily. 60 tablet 5   memantine (NAMENDA) 10 MG tablet TAKE 1 TABLET BY MOUTH TWICE DAILY. 60 tablet 3   Misc Natural Products (PROSTATE HEALTH) CAPS Take 1 capsule by mouth in the morning and at bedtime.     ondansetron (ZOFRAN) 8 MG tablet Take 1 tablet (8 mg total) by mouth 2 (two) times daily as needed for refractory nausea / vomiting. Start on day 3 after cyclophosphamide chemotherapy. 30 tablet 1   polyethylene glycol (MIRALAX) 17 g packet Take 17 g by mouth daily. 30 each 1   predniSONE (DELTASONE) 20 MG tablet Take 40 mg by mouth for 4 days after chemo, every 3 weeks 60 tablet 1   prochlorperazine (COMPAZINE) 10 MG tablet Take 1 tablet (10 mg total) by mouth every 6 (six) hours as needed (Nausea or vomiting). 30 tablet 6   QUEtiapine (SEROQUEL) 25 MG tablet Take 0.5 tablets (12.5 mg total) by mouth at bedtime. 90 tablet 1   tamsulosin (FLOMAX) 0.4 MG CAPS capsule Take 0.4 mg by mouth daily.     No current facility-administered medications for this visit.    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 No evidence for primary malignancy or metastatic disease in the chest, abdomen, or pelvis. 2. Subtle edema/inflammation in the sigmoid mesocolon in the region of an ill-defined diverticulum. Imaging features consistent with sigmoid diverticulitis. No perforation or abscess. 3. Prostatomegaly with evidence of median lobe hypertrophy generating mass-effect on the inferior bladder. 4. Nonacute left-sided rib fractures. 5. Chronic  insufficiency fracture left sacrum with L2 compression fracture. 6. Aortic Atherosclerosis (ICD10-I70.0) and Emphysema (ICD10-J43.9).   06/14/2021 Bone Marrow Biopsy   BONE MARROW, ASPIRATE, CLOT, CORE:  -Hypercellular bone marrow with extensive involvement by a B-cell lymphoproliferative disorder  -Monoclonal plasma cell population identified  -See comment   PERIPHERAL BLOOD:  -Pancytopenia   COMMENT:   The bone marrow shows extensive involvement by a high-grade B-cell lymphoproliferative disorder displaying dim surface lambda light chain expression and bright CD45 consistent with involvement by high-grade B-cell lymphoma.  In addition, there is an admixed monoclonal plasma cell population representing 20% of all cells in the aspirate and displaying kappa light chain restriction consistent with a neoplastic process.  Since the lymphoid and the plasma cell components have different light chain expression, they appear unrelated and may represent two distinct disease processes.  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 Catheter tip at the superior cavoatrial junction.   07/01/2021 Echocardiogram   1. Hypokinesis of the basal inferolateral wall with overall low normal LV function.  2. Left ventricular ejection fraction, by estimation, is 50 to 55%. The left ventricle has low normal function. The left ventricle demonstrates regional wall motion abnormalities (see scoring diagram/findings for description). There is mild left ventricular hypertrophy of the basal-septal segment. Left ventricular diastolic  parameters are consistent with Grade I diastolic dysfunction (impaired relaxation). Elevated left atrial pressure.  3. Right ventricular systolic function is normal. The right ventricular size is normal.  4. The mitral valve is normal in structure. Mild mitral valve regurgitation. No evidence of mitral stenosis.  5. The aortic valve is tricuspid. Aortic valve regurgitation is trivial. Aortic valve sclerosis is present, with no evidence of aortic valve stenosis.  6. Aortic dilatation noted. There is mild dilatation of the aortic root, measuring 41 mm.     07/02/2021 -  Chemotherapy   Patient is on Treatment Plan : NON-HODGKINS LYMPHOMA R-CHOP q21d     Multiple myeloma not having achieved remission ([place])  06/18/2021 Initial Diagnosis   Multiple myeloma not having achieved remission ([place])   06/18/2021 Cancer Staging   Staging form: Plasma Cell Myeloma and Plasma Cell Disorders, AJCC 8th Edition - Clinical stage from 06/18/2021: Albumin (g/dL): 3.1, ISS: Stage II, High-risk cytogenetics: Unknown, LDH: Elevated - Signed by Heath Lark, MD on 06/18/2021 Stage prefix: Initial diagnosis Albumin range (g/dL): Less than 3.5 Cytogenetics: Unknown     PHYSICAL EXAMINATION: ECOG PERFORMANCE STATUS: 2 - Symptomatic, <50% confined to bed  Vitals:   08/13/21 0946  BP: 106/70  Pulse: (!) 120  Resp: 18  Temp: 98.7 F (37.1 C)  SpO2: 96%   Filed Weights   08/13/21 0946  Weight: 144 lb 8 oz (65.5 kg)    GENERAL:alert, no distress and comfortable NEURO: alert & oriented x 3 with fluent speech, no focal motor/sensory deficits  LABORATORY DATA:  I have reviewed the data as listed    Component Value Date/Time   NA 139 08/13/2021 0926   K 3.6 08/13/2021 0926   CL 106 08/13/2021 0926   CO2 27 08/13/2021 0926   GLUCOSE 184 (H) 08/13/2021 0926   BUN 18 08/13/2021 0926   CREATININE 0.80 08/13/2021 0926   CALCIUM 9.9 08/13/2021 0926   PROT 6.6 08/13/2021 0926   ALBUMIN 3.7 08/13/2021 0926   AST  23 08/13/2021 0926   ALT 21 08/13/2021 0926   ALKPHOS 77 08/13/2021 0926   BILITOT 0.5 08/13/2021 0926   GFRNONAA >60 08/13/2021 0926   GFRAA >60 04/25/2019 0528    No results found for: "SPEP", "UPEP"  Lab Results  Component Value Date   WBC 6.3 08/13/2021   NEUTROABS 4.4 08/13/2021   HGB 9.6 (L) 08/13/2021   HCT 29.3 (L) 08/13/2021   MCV 106.9 (H) 08/13/2021   PLT 207 08/13/2021      Chemistry      Component Value Date/Time   NA 139 08/13/2021 0926   K 3.6 08/13/2021 0926   CL 106 08/13/2021 0926   CO2 27 08/13/2021 0926   BUN 18 08/13/2021 0926   CREATININE 0.80 08/13/2021 0926      Component Value Date/Time   CALCIUM 9.9 08/13/2021 0926   ALKPHOS 77 08/13/2021 0926   AST 23 08/13/2021 0926   ALT 21 08/13/2021 0926   BILITOT 0.5 08/13/2021 0926

## 2021-08-16 ENCOUNTER — Other Ambulatory Visit: Payer: Self-pay

## 2021-08-16 ENCOUNTER — Inpatient Hospital Stay: Payer: Medicare HMO

## 2021-08-16 VITALS — BP 104/69 | HR 82 | Temp 97.5°F | Resp 16

## 2021-08-16 DIAGNOSIS — C8510 Unspecified B-cell lymphoma, unspecified site: Secondary | ICD-10-CM | POA: Diagnosis not present

## 2021-08-16 DIAGNOSIS — Z5111 Encounter for antineoplastic chemotherapy: Secondary | ICD-10-CM | POA: Diagnosis not present

## 2021-08-16 DIAGNOSIS — Z5189 Encounter for other specified aftercare: Secondary | ICD-10-CM | POA: Diagnosis not present

## 2021-08-16 DIAGNOSIS — Z5112 Encounter for antineoplastic immunotherapy: Secondary | ICD-10-CM | POA: Diagnosis not present

## 2021-08-16 DIAGNOSIS — D539 Nutritional anemia, unspecified: Secondary | ICD-10-CM | POA: Diagnosis not present

## 2021-08-16 MED ORDER — PEGFILGRASTIM-CBQV 6 MG/0.6ML ~~LOC~~ SOSY
6.0000 mg | PREFILLED_SYRINGE | Freq: Once | SUBCUTANEOUS | Status: AC
  Administered 2021-08-16: 6 mg via SUBCUTANEOUS
  Filled 2021-08-16: qty 0.6

## 2021-08-16 NOTE — Patient Instructions (Signed)

## 2021-08-23 ENCOUNTER — Ambulatory Visit: Payer: Medicare HMO | Admitting: Internal Medicine

## 2021-08-23 ENCOUNTER — Telehealth: Payer: Self-pay

## 2021-08-23 NOTE — Telephone Encounter (Signed)
I am overbooked this entire week The last time I look at his skin there is bunch of seborrheic keratosis that might have flared up due to his chemo Antibiotic cream may not help He can get OTC 1% cream apply twice daily I can see him next Monday and we can get labs on that day

## 2021-08-23 NOTE — Telephone Encounter (Signed)
Called and given below message. Wife verbalized understanding. Appt scheduled for 6/26 and they are aware of appts. They will start hydrocortisone cream today and call the office back with questions.

## 2021-08-23 NOTE — Telephone Encounter (Signed)
Returned call the wife. Henry [name] has a red rash with sore looking areas to his face on his cheeks and upper back. It is itchy at times. He has had the rash for a a while. She is not sure how long exactly. He is unable to shave his face on the rash area for about 2 weeks. She has been applying antibiotic cream to the rash areas. She will take pictures with her phone but is unable to send via mychart.  She is asking for recommendations?

## 2021-08-28 ENCOUNTER — Other Ambulatory Visit: Payer: Self-pay | Admitting: Hematology and Oncology

## 2021-08-30 ENCOUNTER — Inpatient Hospital Stay: Payer: Medicare HMO

## 2021-08-30 ENCOUNTER — Encounter: Payer: Self-pay | Admitting: Hematology and Oncology

## 2021-08-30 ENCOUNTER — Inpatient Hospital Stay: Payer: Medicare HMO | Admitting: Hematology and Oncology

## 2021-08-30 ENCOUNTER — Other Ambulatory Visit: Payer: Self-pay

## 2021-08-30 DIAGNOSIS — C8510 Unspecified B-cell lymphoma, unspecified site: Secondary | ICD-10-CM

## 2021-08-30 DIAGNOSIS — L989 Disorder of the skin and subcutaneous tissue, unspecified: Secondary | ICD-10-CM | POA: Diagnosis not present

## 2021-08-30 DIAGNOSIS — R21 Rash and other nonspecific skin eruption: Secondary | ICD-10-CM | POA: Insufficient documentation

## 2021-08-30 DIAGNOSIS — Z5112 Encounter for antineoplastic immunotherapy: Secondary | ICD-10-CM | POA: Diagnosis not present

## 2021-08-30 DIAGNOSIS — D539 Nutritional anemia, unspecified: Secondary | ICD-10-CM

## 2021-08-30 DIAGNOSIS — Z5111 Encounter for antineoplastic chemotherapy: Secondary | ICD-10-CM | POA: Diagnosis not present

## 2021-08-30 DIAGNOSIS — Z5189 Encounter for other specified aftercare: Secondary | ICD-10-CM | POA: Diagnosis not present

## 2021-08-30 LAB — CBC WITH DIFFERENTIAL (CANCER CENTER ONLY)
Abs Immature Granulocytes: 0.05 10*3/uL (ref 0.00–0.07)
Basophils Absolute: 0 10*3/uL (ref 0.0–0.1)
Basophils Relative: 0 %
Eosinophils Absolute: 0.2 10*3/uL (ref 0.0–0.5)
Eosinophils Relative: 2 %
HCT: 28.3 % — ABNORMAL LOW (ref 39.0–52.0)
Hemoglobin: 9.3 g/dL — ABNORMAL LOW (ref 13.0–17.0)
Immature Granulocytes: 1 %
Lymphocytes Relative: 14 %
Lymphs Abs: 0.9 10*3/uL (ref 0.7–4.0)
MCH: 36.8 pg — ABNORMAL HIGH (ref 26.0–34.0)
MCHC: 32.9 g/dL (ref 30.0–36.0)
MCV: 111.9 fL — ABNORMAL HIGH (ref 80.0–100.0)
Monocytes Absolute: 0.7 10*3/uL (ref 0.1–1.0)
Monocytes Relative: 11 %
Neutro Abs: 4.9 10*3/uL (ref 1.7–7.7)
Neutrophils Relative %: 72 %
Platelet Count: 154 10*3/uL (ref 150–400)
RBC: 2.53 MIL/uL — ABNORMAL LOW (ref 4.22–5.81)
Smear Review: NORMAL
WBC Count: 6.8 10*3/uL (ref 4.0–10.5)
nRBC: 0 % (ref 0.0–0.2)

## 2021-08-30 LAB — SAMPLE TO BLOOD BANK

## 2021-08-30 LAB — CMP (CANCER CENTER ONLY)
ALT: 19 U/L (ref 0–44)
AST: 20 U/L (ref 15–41)
Albumin: 3.6 g/dL (ref 3.5–5.0)
Alkaline Phosphatase: 88 U/L (ref 38–126)
Anion gap: 7 (ref 5–15)
BUN: 21 mg/dL (ref 8–23)
CO2: 27 mmol/L (ref 22–32)
Calcium: 10 mg/dL (ref 8.9–10.3)
Chloride: 109 mmol/L (ref 98–111)
Creatinine: 0.94 mg/dL (ref 0.61–1.24)
GFR, Estimated: >60 mL/min (ref >60)
Glucose, Bld: 180 mg/dL — ABNORMAL HIGH (ref 70–99)
Potassium: 3.6 mmol/L (ref 3.5–5.1)
Sodium: 143 mmol/L (ref 135–145)
Total Bilirubin: 0.3 mg/dL (ref 0.3–1.2)
Total Protein: 6.7 g/dL (ref 6.5–8.1)

## 2021-08-30 MED ORDER — HEPARIN SOD (PORK) LOCK FLUSH 100 UNIT/ML IV SOLN: 250.0000 [IU] | Freq: Once | INTRAVENOUS | Status: DC

## 2021-08-30 MED ORDER — SODIUM CHLORIDE 0.9% FLUSH
10.0000 mL | Freq: Once | INTRAVENOUS | Status: AC
  Administered 2021-08-30: 10 mL

## 2021-08-30 NOTE — Assessment & Plan Note (Signed)
He has recent flare of seborrheic keratosis He has appointment to see dermatologist I would defer to dermatologist for management

## 2021-08-30 NOTE — Assessment & Plan Note (Signed)
Despite improvement of his blood count and stability of his blood count, he is not doing well He is excessively sedated during daytime I recommend his wife to remove Seroquel of his medication list and see if he has improvement If his performance status continues to decline, I will discontinue chemotherapy and maintain on rituximab only

## 2021-09-02 MED FILL — Dexamethasone Sodium Phosphate Inj 100 MG/10ML: INTRAMUSCULAR | Qty: 1 | Status: AC

## 2021-09-02 MED FILL — Fosaprepitant Dimeglumine For IV Infusion 150 MG (Base Eq): INTRAVENOUS | Qty: 5 | Status: AC

## 2021-09-03 ENCOUNTER — Encounter: Payer: Self-pay | Admitting: Hematology and Oncology

## 2021-09-03 ENCOUNTER — Inpatient Hospital Stay: Payer: Medicare HMO

## 2021-09-03 ENCOUNTER — Inpatient Hospital Stay: Payer: Medicare HMO | Admitting: Dietician

## 2021-09-03 ENCOUNTER — Inpatient Hospital Stay: Payer: Medicare HMO | Admitting: Hematology and Oncology

## 2021-09-03 ENCOUNTER — Other Ambulatory Visit: Payer: Self-pay

## 2021-09-03 VITALS — BP 98/75 | HR 90 | Temp 97.7°F | Resp 16

## 2021-09-03 DIAGNOSIS — Z5112 Encounter for antineoplastic immunotherapy: Secondary | ICD-10-CM | POA: Diagnosis not present

## 2021-09-03 DIAGNOSIS — Z7189 Other specified counseling: Secondary | ICD-10-CM

## 2021-09-03 DIAGNOSIS — C8510 Unspecified B-cell lymphoma, unspecified site: Secondary | ICD-10-CM

## 2021-09-03 DIAGNOSIS — D539 Nutritional anemia, unspecified: Secondary | ICD-10-CM | POA: Diagnosis not present

## 2021-09-03 DIAGNOSIS — Z5189 Encounter for other specified aftercare: Secondary | ICD-10-CM | POA: Diagnosis not present

## 2021-09-03 DIAGNOSIS — Z5111 Encounter for antineoplastic chemotherapy: Secondary | ICD-10-CM | POA: Diagnosis not present

## 2021-09-03 DIAGNOSIS — C9000 Multiple myeloma not having achieved remission: Secondary | ICD-10-CM | POA: Diagnosis not present

## 2021-09-03 LAB — CMP (CANCER CENTER ONLY)
ALT: 18 U/L (ref 0–44)
AST: 18 U/L (ref 15–41)
Albumin: 3.6 g/dL (ref 3.5–5.0)
Alkaline Phosphatase: 75 U/L (ref 38–126)
Anion gap: 5 (ref 5–15)
BUN: 22 mg/dL (ref 8–23)
CO2: 27 mmol/L (ref 22–32)
Calcium: 9.6 mg/dL (ref 8.9–10.3)
Chloride: 108 mmol/L (ref 98–111)
Creatinine: 0.84 mg/dL (ref 0.61–1.24)
GFR, Estimated: >60 mL/min (ref >60)
Glucose, Bld: 138 mg/dL — ABNORMAL HIGH (ref 70–99)
Potassium: 3.8 mmol/L (ref 3.5–5.1)
Sodium: 140 mmol/L (ref 135–145)
Total Bilirubin: 0.4 mg/dL (ref 0.3–1.2)
Total Protein: 6.6 g/dL (ref 6.5–8.1)

## 2021-09-03 LAB — CBC WITH DIFFERENTIAL (CANCER CENTER ONLY)
Abs Immature Granulocytes: 0.02 10*3/uL (ref 0.00–0.07)
Basophils Absolute: 0 10*3/uL (ref 0.0–0.1)
Basophils Relative: 1 %
Eosinophils Absolute: 0.2 10*3/uL (ref 0.0–0.5)
Eosinophils Relative: 3 %
HCT: 28.5 % — ABNORMAL LOW (ref 39.0–52.0)
Hemoglobin: 9.3 g/dL — ABNORMAL LOW (ref 13.0–17.0)
Immature Granulocytes: 0 %
Lymphocytes Relative: 18 %
Lymphs Abs: 0.9 10*3/uL (ref 0.7–4.0)
MCH: 37.1 pg — ABNORMAL HIGH (ref 26.0–34.0)
MCHC: 32.6 g/dL (ref 30.0–36.0)
MCV: 113.5 fL — ABNORMAL HIGH (ref 80.0–100.0)
Monocytes Absolute: 1 10*3/uL (ref 0.1–1.0)
Monocytes Relative: 19 %
Neutro Abs: 3.1 10*3/uL (ref 1.7–7.7)
Neutrophils Relative %: 59 %
Platelet Count: 152 10*3/uL (ref 150–400)
RBC: 2.51 MIL/uL — ABNORMAL LOW (ref 4.22–5.81)
RDW: 23.3 % — ABNORMAL HIGH (ref 11.5–15.5)
WBC Count: 5.3 10*3/uL (ref 4.0–10.5)
nRBC: 0.4 % — ABNORMAL HIGH (ref 0.0–0.2)

## 2021-09-03 LAB — SAMPLE TO BLOOD BANK

## 2021-09-03 MED ORDER — SODIUM CHLORIDE 0.9% FLUSH
10.0000 mL | Freq: Once | INTRAVENOUS | Status: AC
  Administered 2021-09-03: 10 mL

## 2021-09-03 MED ORDER — HEPARIN SOD (PORK) LOCK FLUSH 100 UNIT/ML IV SOLN
500.0000 [IU] | Freq: Once | INTRAVENOUS | Status: AC | PRN
  Administered 2021-09-03: 500 [IU]

## 2021-09-03 MED ORDER — SODIUM CHLORIDE 0.9 % IV SOLN
375.0000 mg/m2 | Freq: Once | INTRAVENOUS | Status: AC
  Administered 2021-09-03: 700 mg via INTRAVENOUS
  Filled 2021-09-03: qty 50

## 2021-09-03 MED ORDER — ACETAMINOPHEN 325 MG PO TABS
650.0000 mg | ORAL_TABLET | Freq: Once | ORAL | Status: AC
  Administered 2021-09-03: 650 mg via ORAL
  Filled 2021-09-03: qty 2

## 2021-09-03 MED ORDER — SODIUM CHLORIDE 0.9 % IV SOLN
10.0000 mg | Freq: Once | INTRAVENOUS | Status: AC
  Administered 2021-09-03: 10 mg via INTRAVENOUS
  Filled 2021-09-03: qty 10

## 2021-09-03 MED ORDER — SODIUM CHLORIDE 0.9% FLUSH
10.0000 mL | INTRAVENOUS | Status: DC | PRN
  Administered 2021-09-03: 10 mL

## 2021-09-03 MED ORDER — SODIUM CHLORIDE 0.9 % IV SOLN: Freq: Once | INTRAVENOUS | Status: AC

## 2021-09-03 NOTE — Assessment & Plan Note (Signed)
He is transfusion independent since chemotherapy He does not need transfusion support today Observe closely

## 2021-09-03 NOTE — Progress Notes (Signed)
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 Patient is receiving reduced dose R-CHOP q21d.  Met with patient during infusion. He reports appetite has not been so good recently. Patient recalls eating 2 meals, sometimes 3. He is drinking 2 Ensure. Patient agreeable to drinking Ensure Plus during infusion. He requested strawberry flavor. Patient asking if Ensure will help him to "get some energy." Patient denies nausea, vomiting, diarrhea, constipation.   Medications: reviewed   Labs: glucose 138  Anthropometrics: Weight 143 lb 12.8 oz today increased  6/26 - 138 lb 12.8 oz 6/9 - 144 lb 8 oz  5/22 - 145 lb 9.6 oz  5/8 - 138 lb    NUTRITION DIAGNOSIS: Severe malnutrition continues    INTERVENTION:  Encouraged high calorie high protein foods for weight maintenance Recommend increasing Ensure to 3/day, pt is agreeable - coupons provided     MONITORING, EVALUATION, GOAL: weight trends, intake   NEXT VISIT: To be scheduled with treatment plan

## 2021-09-03 NOTE — Progress Notes (Signed)
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[place]) Overall, he felt better since last time I saw him His wife felt Seroquel is contributing to excessive sedation The patient has achieved good response to treatment after 3 cycles of therapy Given his stroke and poor performance status, I recommend discontinuation of chemotherapy and for him to receive rituximab only today and he is in agreement I do not feel strongly we need to order a bone marrow biopsy to confirm response to treatment Next month, when I see him, I plan to switch him to maintenance Revlimid This will help control his lymphoma and treat his myeloma  Multiple myeloma not having achieved remission ([place]) He is not receiving treatment for this I am hopeful when we start him on treatment for Revlimid in the future, he might also get response for his multiple myeloma I plan to order myeloma panel in his next visit  Deficiency anemia He is transfusion independent since chemotherapy He does not need transfusion support today Observe closely  Goals of care, counseling/discussion I addressed his goals of care today The patient has very poor baseline performance status We are in agreement to discontinue his chemotherapy plan early  Orders Placed This Encounter  Procedures   Kappa/lambda light chains    Standing Status:   Standing    Number of Occurrences:   22    Standing Expiration Date:   09/04/2022   Multiple Myeloma Panel (SPEP&IFE w/QIG)    Standing Status:   Standing    Number of Occurrences:   22    Standing Expiration Date:   09/04/2022    All questions were answered. The patient knows to call the clinic with any problems, questions or concerns. The total time spent in the appointment was 30 minutes  encounter with patients including review of chart and various tests results, discussions about plan of care and coordination of care plan   Heath Lark, MD 09/03/2021 1:00 PM  INTERVAL HISTORY: Please see below for problem oriented charting. he returns for treatment follow-up with his wife Since we discontinue Seroquel, he is more alert He has gained some weight and eating better No recent falls Denies recent bleeding  REVIEW OF SYSTEMS:   Constitutional: Denies fevers, chills or abnormal weight loss Eyes: Denies blurriness of vision Ears, nose, mouth, throat, and face: Denies mucositis or sore throat Respiratory: Denies cough, dyspnea or wheezes Cardiovascular: Denies palpitation, chest discomfort or lower extremity swelling Gastrointestinal:  Denies nausea, heartburn or change in bowel habits Skin: Denies abnormal skin rashes Lymphatics: Denies new lymphadenopathy or easy bruising Neurological:Denies numbness, tingling or new weaknesses Behavioral/Psych: Mood is stable, no new changes  All other systems were reviewed with the patient and are negative.  I have reviewed the past medical history, past surgical history, social history and family history with the patient and they are unchanged from previous note.  ALLERGIES:  has No Known Allergies.  MEDICATIONS:  Current Outpatient Medications  Medication Sig Dispense Refill   acetaminophen (TYLENOL) 325 MG tablet Take 650 mg by mouth every 6 (six) hours as needed for moderate pain.     acyclovir (ZOVIRAX) 400 MG tablet Take 1 tablet (400 mg total) by mouth daily. 30 tablet 6   atorvastatin (LIPITOR) 40 MG tablet TAKE ONE (1) TABLET BY MOUTH EVERY DAY  AT 6PM. (Patient taking differently: Take 20 mg by mouth every other day.) 30 tablet 5   B Complex-Folic Acid (B COMPLEX PLUS) TABS Take 1 tablet by mouth daily. 100 tablet 3   Cholecalciferol (VITAMIN D3) 50 MCG (2000 UT) capsule Take 1 capsule (2,000 Units total) by mouth daily. 100  capsule 3   donepezil (ARICEPT) 10 MG tablet Take 1 tablet (10 mg total) by mouth at bedtime. 90 tablet 3   lidocaine-prilocaine (EMLA) cream Apply to affected area once 30 g 3   megestrol (MEGACE) 40 MG tablet Take 1 tablet (40 mg total) by mouth 2 (two) times daily. 60 tablet 5   memantine (NAMENDA) 10 MG tablet TAKE 1 TABLET BY MOUTH TWICE DAILY. 60 tablet 3   Misc Natural Products (PROSTATE HEALTH) CAPS Take 1 capsule by mouth in the morning and at bedtime.     ondansetron (ZOFRAN) 8 MG tablet Take 1 tablet (8 mg total) by mouth 2 (two) times daily as needed for refractory nausea / vomiting. Start on day 3 after cyclophosphamide chemotherapy. 30 tablet 1   polyethylene glycol (MIRALAX) 17 g packet Take 17 g by mouth daily. 30 each 1   prochlorperazine (COMPAZINE) 10 MG tablet Take 1 tablet (10 mg total) by mouth every 6 (six) hours as needed (Nausea or vomiting). 30 tablet 6   tamsulosin (FLOMAX) 0.4 MG CAPS capsule Take 0.4 mg by mouth daily.     No current facility-administered medications for this visit.   Facility-Administered Medications Ordered in Other Visits  Medication Dose Route Frequency Provider Last Rate Last Admin   heparin lock flush 100 unit/mL  500 Units Intracatheter Once PRN Alvy Bimler, Faysal Fenoglio, MD       riTUXimab-pvvr (RUXIENCE) 700 mg in sodium chloride 0.9 % 180 mL infusion  375 mg/m2 (Treatment Plan Recorded) Intravenous Once Alvy Bimler, Seanne Chirico, MD       sodium chloride flush (NS) 0.9 % injection 10 mL  10 mL Intracatheter PRN Alvy Bimler, Kelcey Wickstrom, MD        SUMMARY OF ONCOLOGIC HISTORY: Oncology History Overview Note  Normal cytogenetics, BCL-2 rearrangement by FISH is positive, neg for BCL-6 or cMYC  Myeloma FISH panel showed gain chromosome 11 and deletion 1p   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 No evidence for primary malignancy or metastatic disease in the chest, abdomen, or pelvis. 2. Subtle edema/inflammation in the sigmoid mesocolon in the region of an  ill-defined diverticulum. Imaging features consistent with sigmoid diverticulitis. No perforation or abscess. 3. Prostatomegaly with evidence of median lobe hypertrophy generating mass-effect on the inferior bladder. 4. Nonacute left-sided rib fractures. 5. 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 6. Aortic Atherosclerosis (ICD10-I70.0) and Emphysema (ICD10-J43.9).   06/14/2021 Bone Marrow Biopsy   BONE MARROW, ASPIRATE, CLOT, CORE:  -Hypercellular bone marrow with extensive involvement by a B-cell lymphoproliferative disorder  -Monoclonal plasma cell population identified  -See comment   PERIPHERAL BLOOD:  -Pancytopenia   COMMENT:   The bone marrow shows extensive involvement by a high-grade B-cell lymphoproliferative disorder displaying dim surface lambda light chain expression and bright CD45 consistent with involvement by high-grade B-cell lymphoma.  In addition, there is an admixed monoclonal plasma cell population representing 20% of all cells in the aspirate and displaying kappa light chain restriction consistent with a neoplastic process.  Since the lymphoid and the plasma cell components have different light chain expression, they appear unrelated and may represent two distinct disease processes.  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 Catheter tip at the superior cavoatrial junction.   07/01/2021 Echocardiogram   1. Hypokinesis of the basal inferolateral wall with overall low normal LV function.  2. Left ventricular ejection fraction, by estimation, is  50 to 55%. The left ventricle has low normal function. The left ventricle demonstrates regional wall motion abnormalities (see scoring diagram/findings for description). There is mild left ventricular hypertrophy of the basal-septal segment. Left ventricular diastolic parameters are consistent with Grade I diastolic dysfunction (impaired relaxation). Elevated left atrial pressure.  3. Right ventricular systolic function is normal. The right ventricular size is normal.  4. The mitral valve is normal in structure. Mild mitral valve regurgitation. No evidence of mitral stenosis.  5. The aortic valve is tricuspid. Aortic valve regurgitation is trivial. Aortic valve sclerosis is present, with no evidence of aortic valve stenosis.  6. Aortic dilatation noted. There is mild dilatation of the aortic root, measuring 41 mm.     07/02/2021 -  Chemotherapy   Patient is on Treatment Plan : NON-HODGKINS LYMPHOMA R-CHOP q21d     Multiple myeloma not having achieved remission ([place])  06/18/2021 Initial Diagnosis   Multiple myeloma not having achieved remission ([place])   06/18/2021 Cancer Staging   Staging form: Plasma Cell Myeloma and Plasma Cell Disorders, AJCC 8th Edition - Clinical stage from 06/18/2021: Albumin (g/dL): 3.1, ISS: Stage II, High-risk cytogenetics: Unknown, LDH: Elevated - Signed by Heath Lark, MD on 06/18/2021 Stage prefix: Initial diagnosis Albumin range (g/dL): Less than 3.5 Cytogenetics: Unknown     PHYSICAL EXAMINATION: ECOG PERFORMANCE STATUS: 2 - Symptomatic, <50% confined to bed  Vitals:   09/03/21 1121  BP: 119/77  Pulse: (!) 112  Resp: 18  SpO2: 93%   Filed Weights   09/03/21 1121  Weight: 143 lb 12.8 oz (65.2 kg)    GENERAL:alert, no distress and comfortable  NEURO: alert & oriented x 3 with fluent speech, no focal motor/sensory deficits  LABORATORY DATA:  I have reviewed the data as listed    Component Value Date/Time   NA 140 09/03/2021 1101   K 3.8 09/03/2021  1101   CL 108 09/03/2021 1101   CO2 27 09/03/2021 1101   GLUCOSE 138 (H) 09/03/2021 1101   BUN 22 09/03/2021 1101   CREATININE 0.84 09/03/2021 1101   CALCIUM 9.6 09/03/2021 1101   PROT 6.6 09/03/2021 1101   ALBUMIN 3.6 09/03/2021 1101   AST 18 09/03/2021 1101   ALT 18 09/03/2021 1101   ALKPHOS 75 09/03/2021 1101   BILITOT 0.4 09/03/2021 1101   GFRNONAA >60 09/03/2021 1101   GFRAA >60 04/25/2019 0528    No results found for: "SPEP", "UPEP"  Lab Results  Component Value Date   WBC 5.3 09/03/2021   NEUTROABS 3.1 09/03/2021   HGB 9.3 (L) 09/03/2021   HCT 28.5 (L) 09/03/2021   MCV 113.5 (H) 09/03/2021   PLT 152 09/03/2021      Chemistry      Component Value Date/Time   NA 140 09/03/2021 1101   K 3.8 09/03/2021 1101   CL 108 09/03/2021 1101   CO2 27 09/03/2021 1101  BUN 22 09/03/2021 1101   CREATININE 0.84 09/03/2021 1101      Component Value Date/Time   CALCIUM 9.6 09/03/2021 1101   ALKPHOS 75 09/03/2021 1101   AST 18 09/03/2021 1101   ALT 18 09/03/2021 1101   BILITOT 0.4 09/03/2021 1101

## 2021-09-03 NOTE — Patient Instructions (Signed)
[facility]  Discharge Instructions: Thank you for choosing [facility] to provide your oncology and hematology care.   If you have a lab appointment with the [facility], please go directly to the [facility] and check in at the registration area.   Wear comfortable clothing and clothing appropriate for easy access to any Portacath or PICC line.   We strive to give you quality time with your provider. You may need to reschedule your appointment if you arrive late (15 or more minutes).  Arriving late affects you and other patients whose appointments are after yours.  Also, if you miss three or more appointments without notifying the office, you may be dismissed from the clinic at the provider's discretion.      For prescription refill requests, have your pharmacy contact our office and allow 72 hours for refills to be completed.    Today you received the following chemotherapy and/or immunotherapy agents: rituxan      To help prevent nausea and vomiting after your treatment, we encourage you to take your nausea medication as directed.  BELOW ARE SYMPTOMS THAT SHOULD BE REPORTED IMMEDIATELY: *FEVER GREATER THAN 100.4 F (38 C) OR HIGHER *CHILLS OR SWEATING *NAUSEA AND VOMITING THAT IS NOT CONTROLLED WITH YOUR NAUSEA MEDICATION *UNUSUAL SHORTNESS OF BREATH *UNUSUAL BRUISING OR BLEEDING *URINARY PROBLEMS (pain or burning when urinating, or frequent urination) *BOWEL PROBLEMS (unusual diarrhea, constipation, pain near the anus) TENDERNESS IN MOUTH AND THROAT WITH OR WITHOUT PRESENCE OF ULCERS (sore throat, sores in mouth, or a toothache) UNUSUAL RASH, SWELLING OR PAIN  UNUSUAL VAGINAL DISCHARGE OR ITCHING   Items with * indicate a potential emergency and should be followed up as soon as possible or go to the Emergency Department if any problems should occur.  Please show the CHEMOTHERAPY ALERT CARD or IMMUNOTHERAPY ALERT CARD at check-in to  the Emergency Department and triage nurse.  Should you have questions after your visit or need to cancel or reschedule your appointment, please contact [facility]  Dept: [phone]  and follow the prompts.  Office hours are 8:00 a.m. to 4:30 p.m. Monday - Friday. Please note that voicemails left after 4:00 p.m. may not be returned until the following business day.  We are closed weekends and major holidays. You have access to a nurse at all times for urgent questions. Please call the main number to the clinic Dept: [phone] and follow the prompts.   For any non-urgent questions, you may also contact your provider using MyChart. We now offer e-Visits for anyone 50 and older to request care online for non-urgent symptoms. For details visit [email or web address].   Also download the MyChart app! Go to the app store, search "MyChart", open the app, select [facility], and log in with your MyChart username and password.  Masks are optional in the cancer centers. If you would like for your care team to wear a mask while they are taking care of you, please let them know. For doctor visits, patients may have with them one support person who is at least 79 years old. At this time, visitors are not allowed in the infusion area.

## 2021-09-03 NOTE — Assessment & Plan Note (Signed)
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

## 2021-09-03 NOTE — Assessment & Plan Note (Signed)
Overall, he felt better since last time I saw him His wife felt Seroquel is contributing to excessive sedation The patient has achieved good response to treatment after 3 cycles of therapy Given his stroke and poor performance status, I recommend discontinuation of chemotherapy and for him to receive rituximab only today and he is in agreement I do not feel strongly we need to order a bone marrow biopsy to confirm response to treatment Next month, when I see him, I plan to switch him to maintenance Revlimid This will help control his lymphoma and treat his myeloma

## 2021-09-03 NOTE — Assessment & Plan Note (Signed)
I addressed his goals of care today The patient has very poor baseline performance status We are in agreement to discontinue his chemotherapy plan early

## 2021-09-06 ENCOUNTER — Inpatient Hospital Stay: Payer: Medicare HMO

## 2021-09-14 DIAGNOSIS — C44622 Squamous cell carcinoma of skin of right upper limb, including shoulder: Secondary | ICD-10-CM | POA: Diagnosis not present

## 2021-09-14 DIAGNOSIS — L300 Nummular dermatitis: Secondary | ICD-10-CM | POA: Diagnosis not present

## 2021-09-14 DIAGNOSIS — L570 Actinic keratosis: Secondary | ICD-10-CM | POA: Diagnosis not present

## 2021-09-15 ENCOUNTER — Encounter: Payer: Self-pay | Admitting: Internal Medicine

## 2021-09-15 ENCOUNTER — Ambulatory Visit (INDEPENDENT_AMBULATORY_CARE_PROVIDER_SITE_OTHER): Payer: Medicare HMO | Admitting: Internal Medicine

## 2021-09-15 DIAGNOSIS — C8510 Unspecified B-cell lymphoma, unspecified site: Secondary | ICD-10-CM

## 2021-09-15 DIAGNOSIS — R350 Frequency of micturition: Secondary | ICD-10-CM

## 2021-09-15 DIAGNOSIS — M5441 Lumbago with sciatica, right side: Secondary | ICD-10-CM | POA: Diagnosis not present

## 2021-09-15 DIAGNOSIS — M5442 Lumbago with sciatica, left side: Secondary | ICD-10-CM

## 2021-09-15 DIAGNOSIS — R32 Unspecified urinary incontinence: Secondary | ICD-10-CM | POA: Insufficient documentation

## 2021-09-15 DIAGNOSIS — G8929 Other chronic pain: Secondary | ICD-10-CM

## 2021-09-15 LAB — URINALYSIS
Bilirubin Urine: NEGATIVE
Hgb urine dipstick: NEGATIVE
Ketones, ur: NEGATIVE
Leukocytes,Ua: NEGATIVE
Nitrite: NEGATIVE
Specific Gravity, Urine: 1.02 (ref 1.000–1.030)
Urine Glucose: NEGATIVE
Urobilinogen, UA: 0.2 (ref 0.0–1.0)
pH: 6 (ref 5.0–8.0)

## 2021-09-15 MED ORDER — PHENAZOPYRIDINE HCL 100 MG PO TABS: 100.0000 mg | ORAL_TABLET | Freq: Every evening | ORAL | 1 refills | Status: DC | PRN

## 2021-09-15 MED ORDER — OXYBUTYNIN CHLORIDE 5 MG PO TABS: 5.0000 mg | ORAL_TABLET | Freq: Every evening | ORAL | 3 refills | Status: DC | PRN

## 2021-09-15 MED ORDER — TAMSULOSIN HCL 0.4 MG PO CAPS: 0.4000 mg | ORAL_CAPSULE | Freq: Every day | ORAL | 6 refills | Status: AC

## 2021-09-15 NOTE — Assessment & Plan Note (Signed)
Better In a w/c

## 2021-09-15 NOTE — Assessment & Plan Note (Signed)
Worse-- severe frequency and urgency - can't sleep at night at all! He sleeps 2-4 h per day (naps), however... . UA to r/o UTI.  Re-start Flomax Use Pyridium or Ditropan prn - side effects, risks discussed

## 2021-09-15 NOTE — Assessment & Plan Note (Signed)
On Chemo 

## 2021-09-15 NOTE — Progress Notes (Signed)
Subjective:  Patient ID: Henry [name], male    DOB: 1940-04-23  Age: 81 y.o. MRN: [id number]  CC: No chief complaint on file.   HPI GRAY DOERING presents for severe frequency and urgency - can't sleep at night at all! He sleeps 2-4 h per day (naps), however... On chemo for MM.B cell lymphoma. F/u on LBP   Outpatient Medications Prior to Visit  Medication Sig Dispense Refill   acetaminophen (TYLENOL) 325 MG tablet Take 650 mg by mouth every 6 (six) hours as needed for moderate pain.     acyclovir (ZOVIRAX) 400 MG tablet Take 1 tablet (400 mg total) by mouth daily. 30 tablet 6   atorvastatin (LIPITOR) 40 MG tablet TAKE ONE (1) TABLET BY MOUTH EVERY DAY AT 6PM. (Patient taking differently: Take 20 mg by mouth every other day.) 30 tablet 5   B Complex-Folic Acid (B COMPLEX PLUS) TABS Take 1 tablet by mouth daily. 100 tablet 3   Cholecalciferol (VITAMIN D3) 50 MCG (2000 UT) capsule Take 1 capsule (2,000 Units total) by mouth daily. 100 capsule 3   donepezil (ARICEPT) 10 MG tablet Take 1 tablet (10 mg total) by mouth at bedtime. 90 tablet 3   lidocaine-prilocaine (EMLA) cream Apply to affected area once 30 g 3   megestrol (MEGACE) 40 MG tablet Take 1 tablet (40 mg total) by mouth 2 (two) times daily. 60 tablet 5   memantine (NAMENDA) 10 MG tablet TAKE 1 TABLET BY MOUTH TWICE DAILY. 60 tablet 3   Misc Natural Products (PROSTATE HEALTH) CAPS Take 1 capsule by mouth in the morning and at bedtime.     ondansetron (ZOFRAN) 8 MG tablet Take 1 tablet (8 mg total) by mouth 2 (two) times daily as needed for refractory nausea / vomiting. Start on day 3 after cyclophosphamide chemotherapy. 30 tablet 1   polyethylene glycol (MIRALAX) 17 g packet Take 17 g by mouth daily. 30 each 1   prochlorperazine (COMPAZINE) 10 MG tablet Take 1 tablet (10 mg total) by mouth every 6 (six) hours as needed (Nausea or vomiting). 30 tablet 6   tamsulosin (FLOMAX) 0.4 MG CAPS capsule Take 0.4 mg by mouth daily.      No facility-administered medications prior to visit.    ROS: Review of Systems  Constitutional:  Positive for fatigue and unexpected weight change. Negative for appetite change.  HENT:  Negative for congestion, nosebleeds, sneezing, sore throat and trouble swallowing.   Eyes:  Negative for itching and visual disturbance.  Respiratory:  Negative for cough.   Cardiovascular:  Negative for chest pain, palpitations and leg swelling.  Gastrointestinal:  Negative for abdominal distention, blood in stool, diarrhea and nausea.  Genitourinary:  Positive for frequency and urgency. Negative for dysuria and hematuria.  Musculoskeletal:  Positive for arthralgias and gait problem. Negative for back pain, joint swelling and neck pain.  Skin:  Positive for wound. Negative for rash.  Neurological:  Positive for weakness. Negative for dizziness, tremors and speech difficulty.  Psychiatric/Behavioral:  Positive for confusion, decreased concentration and dysphoric mood. Negative for agitation and sleep disturbance. The patient is nervous/anxious.     Objective:  BP 120/68 (BP Location: Right Arm, Patient Position: Sitting, Cuff Size: Normal)   Pulse (!) 110   Temp 99.6 F (37.6 C) (Oral)   Ht '5\' 9"'$  (1.753 m)   Wt 142 lb (64.4 kg)   SpO2 93%   BMI 20.97 kg/m   BP Readings from Last 3 Encounters:  09/15/21 120/68  09/03/21 98/75  09/03/21 119/77    Wt Readings from Last 3 Encounters:  09/15/21 142 lb (64.4 kg)  09/03/21 143 lb 12.8 oz (65.2 kg)  08/30/21 138 lb 12.8 oz (63 kg)    Physical Exam Constitutional:      General: He is not in acute distress.    Appearance: He is well-developed.     Comments: NAD  Eyes:     Conjunctiva/sclera: Conjunctivae normal.     Pupils: Pupils are equal, round, and reactive to light.  Neck:     Thyroid: No thyromegaly.     Vascular: No JVD.  Cardiovascular:     Rate and Rhythm: Normal rate and regular rhythm.     Heart sounds: Normal heart sounds.  No murmur heard.    No friction rub. No gallop.  Pulmonary:     Effort: Pulmonary effort is normal. No respiratory distress.     Breath sounds: Normal breath sounds. No wheezing or rales.  Chest:     Chest wall: No tenderness.  Abdominal:     General: Bowel sounds are normal. There is no distension.     Palpations: Abdomen is soft. There is no mass.     Tenderness: There is no abdominal tenderness. There is no guarding or rebound.  Musculoskeletal:        General: Tenderness present. Normal range of motion.     Cervical back: Normal range of motion.  Lymphadenopathy:     Cervical: No cervical adenopathy.  Skin:    General: Skin is warm and dry.     Findings: Bruising present. No rash.  Neurological:     Mental Status: He is alert.     Cranial Nerves: No cranial nerve deficit.     Motor: Weakness present. No abnormal muscle tone.     Coordination: Coordination normal.     Gait: Gait abnormal.     Deep Tendon Reflexes: Reflexes are normal and symmetric.  Psychiatric:        Thought Content: Thought content normal.   In a w/c Wound - R forearm  Lab Results  Component Value Date   WBC 5.3 09/03/2021   HGB 9.3 (L) 09/03/2021   HCT 28.5 (L) 09/03/2021   PLT 152 09/03/2021   GLUCOSE 138 (H) 09/03/2021   CHOL 147 08/26/2019   TRIG 180.0 (H) 08/26/2019   HDL 41.80 08/26/2019   LDLDIRECT 135.0 02/24/2017   LDLCALC 70 08/26/2019   ALT 18 09/03/2021   AST 18 09/03/2021   NA 140 09/03/2021   K 3.8 09/03/2021   CL 108 09/03/2021   CREATININE 0.84 09/03/2021   BUN 22 09/03/2021   CO2 27 09/03/2021   TSH 1.384 06/12/2021   PSA 1.75 02/24/2017   INR 1.2 04/17/2019   HGBA1C 6.6 (H) 05/29/2019    ECHOCARDIOGRAM COMPLETE  Result Date: 06/30/2021    ECHOCARDIOGRAM REPORT   Patient Name:   Henry [name] Date of Exam: 06/30/2021 Medical Rec #:  [id number]       Height:       69.0 in Accession #:    [id number]      Weight:       139.0 lb Date of Birth:  05-Feb-1941       BSA:           1.770 m Patient Age:    78 years        BP:           114/68 mmHg Patient Gender: M  HR:           83 bpm. Exam Location:  Outpatient Procedure: 2D Echo, Cardiac Doppler, Color Doppler and Strain Analysis Indications:    Chemo  History:        Patient has prior history of Echocardiogram examinations, most                 recent 04/17/2019. Stroke.  Sonographer:    [place] Referring Phys: [id number] [place]  1. Hypokinesis of the basal inferolateral wall with overall low normal LV function.  2. Left ventricular ejection fraction, by estimation, is 50 to 55%. The left ventricle has low normal function. The left ventricle demonstrates regional wall motion abnormalities (see scoring diagram/findings for description). There is mild left ventricular hypertrophy of the basal-septal segment. Left ventricular diastolic parameters are consistent with Grade I diastolic dysfunction (impaired relaxation). Elevated left atrial pressure.  3. Right ventricular systolic function is normal. The right ventricular size is normal.  4. The mitral valve is normal in structure. Mild mitral valve regurgitation. No evidence of mitral stenosis.  5. The aortic valve is tricuspid. Aortic valve regurgitation is trivial. Aortic valve sclerosis is present, with no evidence of aortic valve stenosis.  6. Aortic dilatation noted. There is mild dilatation of the aortic root, measuring 41 mm. FINDINGS  Left Ventricle: Left ventricular ejection fraction, by estimation, is 50 to 55%. The left ventricle has low normal function. The left ventricle demonstrates regional wall motion abnormalities. The left ventricular internal cavity size was normal in size. There is mild left ventricular hypertrophy of the basal-septal segment. Abnormal (paradoxical) septal motion, consistent with left bundle branch block. Left ventricular diastolic parameters are consistent with Grade I diastolic dysfunction (impaired relaxation).  Elevated left atrial pressure. Right Ventricle: The right ventricular size is normal. Right ventricular systolic function is normal. Left Atrium: Left atrial size was normal in size. Right Atrium: Right atrial size was normal in size. Pericardium: Trivial pericardial effusion is present. Mitral Valve: The mitral valve is normal in structure. Mild mitral valve regurgitation. No evidence of mitral valve stenosis. Tricuspid Valve: The tricuspid valve is normal in structure. Tricuspid valve regurgitation is trivial. No evidence of tricuspid stenosis. Aortic Valve: The aortic valve is tricuspid. Aortic valve regurgitation is trivial. Aortic valve sclerosis is present, with no evidence of aortic valve stenosis. Aortic valve mean gradient measures 5.0 mmHg. Aortic valve peak gradient measures 8.3 mmHg. Aortic valve area, by VTI measures 2.40 cm. Pulmonic Valve: The pulmonic valve was normal in structure. Pulmonic valve regurgitation is not visualized. No evidence of pulmonic stenosis. Aorta: Aortic dilatation noted. There is mild dilatation of the aortic root, measuring 41 mm. Venous: The inferior vena cava was not well visualized. IAS/Shunts: The interatrial septum was not well visualized. Additional Comments: Hypokinesis of the basal inferolateral wall with overall low normal LV function.  LEFT VENTRICLE PLAX 2D LVIDd:         3.90 cm     Diastology LVIDs:         3.40 cm     LV e' medial:    3.59 cm/s LV PW:         1.10 cm     LV E/e' medial:  17.7 LV IVS:        1.30 cm     LV e' lateral:   7.18 cm/s LVOT diam:     2.10 cm     LV E/e' lateral: 8.8 LV SV:  55 LV SV Index:   31 LVOT Area:     3.46 cm  LV Volumes (MOD) LV vol d, MOD A2C: 56.4 ml LV vol d, MOD A4C: 61.9 ml LV vol s, MOD A2C: 29.5 ml LV vol s, MOD A4C: 30.9 ml LV SV MOD A2C:     26.9 ml LV SV MOD A4C:     61.9 ml LV SV MOD BP:      31.5 ml RIGHT VENTRICLE RV Basal diam:  2.90 cm RV Mid diam:    2.60 cm RV S prime:     10.80 cm/s TAPSE (M-mode):  1.2 cm LEFT ATRIUM           Index LA Vol (A4C): 32.3 ml 18.25 ml/m  AORTIC VALVE                    PULMONIC VALVE AV Area (Vmax):    2.30 cm     PV Vmax:       0.72 m/s AV Area (Vmean):   2.14 cm     PV Vmean:      48.600 cm/s AV Area (VTI):     2.40 cm     PV VTI:        0.122 m AV Vmax:           144.00 cm/s  PV Peak grad:  2.1 mmHg AV Vmean:          99.900 cm/s  PV Mean grad:  1.0 mmHg AV VTI:            0.231 m AV Peak Grad:      8.3 mmHg AV Mean Grad:      5.0 mmHg LVOT Vmax:         95.70 cm/s LVOT Vmean:        61.800 cm/s LVOT VTI:          0.160 m LVOT/AV VTI ratio: 0.69  AORTA Ao Root diam: 3.10 cm Ao Asc diam:  3.30 cm MITRAL VALVE                TRICUSPID VALVE MV Area (PHT): 3.65 cm     TR Peak grad:   19.7 mmHg MV Decel Time: 208 msec     TR Vmax:        222.00 cm/s MV E velocity: 63.40 cm/s MV A velocity: 102.00 cm/s  SHUNTS MV E/A ratio:  0.62         Systemic VTI:  0.16 m                             Systemic Diam: 2.10 cm Kirk Ruths MD Electronically signed by Kirk Ruths MD Signature Date/Time: 06/30/2021/1:19:02 PM    Final     Assessment & Plan:   Problem List Items Addressed This Visit     High grade B-cell lymphoma (HCC)    On Chemo      LOW BACK PAIN    Better In a w/c      Urinary frequency    Worse-- severe frequency and urgency - can't sleep at night at all! He sleeps 2-4 h per day (naps), however... . UA to r/o UTI.  Re-start Flomax Use Pyridium or Ditropan prn - side effects, risks discussed       Relevant Orders   Urinalysis      Meds ordered this encounter  Medications   tamsulosin (FLOMAX) 0.4  MG CAPS capsule    Sig: Take 1 capsule (0.4 mg total) by mouth daily.    Dispense:  30 capsule    Refill:  6   phenazopyridine (PYRIDIUM) 100 MG tablet    Sig: Take 1 tablet (100 mg total) by mouth at bedtime as needed for pain.    Dispense:  30 tablet    Refill:  1   oxybutynin (DITROPAN) 5 MG tablet    Sig: Take 1 tablet (5 mg total) by mouth  at bedtime as needed.    Dispense:  30 tablet    Refill:  3      Follow-up: Return in about 6 weeks (around 10/27/2021) for a follow-up visit.  Walker Kehr, MD

## 2021-09-22 ENCOUNTER — Encounter: Payer: Self-pay | Admitting: Internal Medicine

## 2021-09-23 ENCOUNTER — Other Ambulatory Visit: Payer: Self-pay | Admitting: Internal Medicine

## 2021-09-23 MED ORDER — MEMANTINE HCL 10 MG PO TABS
10.0000 mg | ORAL_TABLET | Freq: Two times a day (BID) | ORAL | 11 refills | Status: DC
Start: 2021-09-23 — End: 2021-10-27

## 2021-09-27 ENCOUNTER — Other Ambulatory Visit: Payer: Self-pay

## 2021-10-04 ENCOUNTER — Other Ambulatory Visit (HOSPITAL_COMMUNITY): Payer: Self-pay

## 2021-10-04 ENCOUNTER — Telehealth: Payer: Self-pay

## 2021-10-04 ENCOUNTER — Telehealth: Payer: Self-pay | Admitting: Pharmacy Technician

## 2021-10-04 ENCOUNTER — Inpatient Hospital Stay: Payer: Medicare HMO | Attending: Hematology and Oncology

## 2021-10-04 ENCOUNTER — Other Ambulatory Visit: Payer: Self-pay

## 2021-10-04 ENCOUNTER — Encounter: Payer: Self-pay | Admitting: Hematology and Oncology

## 2021-10-04 ENCOUNTER — Inpatient Hospital Stay: Payer: Medicare HMO

## 2021-10-04 ENCOUNTER — Inpatient Hospital Stay (HOSPITAL_BASED_OUTPATIENT_CLINIC_OR_DEPARTMENT_OTHER): Payer: Medicare HMO | Admitting: Hematology and Oncology

## 2021-10-04 VITALS — BP 127/85 | HR 119 | Temp 97.8°F | Resp 18 | Ht 69.0 in | Wt 146.0 lb

## 2021-10-04 DIAGNOSIS — Z79899 Other long term (current) drug therapy: Secondary | ICD-10-CM | POA: Insufficient documentation

## 2021-10-04 DIAGNOSIS — C9000 Multiple myeloma not having achieved remission: Secondary | ICD-10-CM

## 2021-10-04 DIAGNOSIS — L989 Disorder of the skin and subcutaneous tissue, unspecified: Secondary | ICD-10-CM | POA: Insufficient documentation

## 2021-10-04 DIAGNOSIS — C8510 Unspecified B-cell lymphoma, unspecified site: Secondary | ICD-10-CM

## 2021-10-04 DIAGNOSIS — D539 Nutritional anemia, unspecified: Secondary | ICD-10-CM

## 2021-10-04 DIAGNOSIS — D61818 Other pancytopenia: Secondary | ICD-10-CM | POA: Diagnosis not present

## 2021-10-04 LAB — CBC WITH DIFFERENTIAL (CANCER CENTER ONLY)
Abs Immature Granulocytes: 0.06 10*3/uL (ref 0.00–0.07)
Basophils Absolute: 0 10*3/uL (ref 0.0–0.1)
Basophils Relative: 1 %
Eosinophils Absolute: 0.2 10*3/uL (ref 0.0–0.5)
Eosinophils Relative: 5 %
HCT: 34.6 % — ABNORMAL LOW (ref 39.0–52.0)
Hemoglobin: 11.5 g/dL — ABNORMAL LOW (ref 13.0–17.0)
Immature Granulocytes: 1 %
Lymphocytes Relative: 27 %
Lymphs Abs: 1.2 10*3/uL (ref 0.7–4.0)
MCH: 39.5 pg — ABNORMAL HIGH (ref 26.0–34.0)
MCHC: 33.2 g/dL (ref 30.0–36.0)
MCV: 118.9 fL — ABNORMAL HIGH (ref 80.0–100.0)
Monocytes Absolute: 0.5 10*3/uL (ref 0.1–1.0)
Monocytes Relative: 12 %
Neutro Abs: 2.4 10*3/uL (ref 1.7–7.7)
Neutrophils Relative %: 54 %
Platelet Count: 130 10*3/uL — ABNORMAL LOW (ref 150–400)
RBC: 2.91 MIL/uL — ABNORMAL LOW (ref 4.22–5.81)
RDW: 16.8 % — ABNORMAL HIGH (ref 11.5–15.5)
WBC Count: 4.5 10*3/uL (ref 4.0–10.5)
nRBC: 0 % (ref 0.0–0.2)

## 2021-10-04 LAB — CMP (CANCER CENTER ONLY)
ALT: 17 U/L (ref 0–44)
AST: 21 U/L (ref 15–41)
Albumin: 4 g/dL (ref 3.5–5.0)
Alkaline Phosphatase: 84 U/L (ref 38–126)
Anion gap: 6 (ref 5–15)
BUN: 18 mg/dL (ref 8–23)
CO2: 29 mmol/L (ref 22–32)
Calcium: 9.7 mg/dL (ref 8.9–10.3)
Chloride: 107 mmol/L (ref 98–111)
Creatinine: 0.82 mg/dL (ref 0.61–1.24)
GFR, Estimated: >60 mL/min (ref >60)
Glucose, Bld: 166 mg/dL — ABNORMAL HIGH (ref 70–99)
Potassium: 3.8 mmol/L (ref 3.5–5.1)
Sodium: 142 mmol/L (ref 135–145)
Total Bilirubin: 0.4 mg/dL (ref 0.3–1.2)
Total Protein: 7.1 g/dL (ref 6.5–8.1)

## 2021-10-04 LAB — SAMPLE TO BLOOD BANK

## 2021-10-04 MED ORDER — HEPARIN SOD (PORK) LOCK FLUSH 100 UNIT/ML IV SOLN
500.0000 [IU] | Freq: Once | INTRAVENOUS | Status: AC
  Administered 2021-10-04: 500 [IU]

## 2021-10-04 MED ORDER — LENALIDOMIDE 5 MG PO CAPS: 5.0000 mg | ORAL_CAPSULE | Freq: Every day | ORAL | 0 refills | Status: DC

## 2021-10-04 MED ORDER — SODIUM CHLORIDE 0.9% FLUSH
10.0000 mL | Freq: Once | INTRAVENOUS | Status: AC
  Administered 2021-10-04: 10 mL

## 2021-10-04 MED ORDER — TRAMADOL HCL 50 MG PO TABS: 50.0000 mg | ORAL_TABLET | Freq: Four times a day (QID) | ORAL | 0 refills | Status: DC | PRN

## 2021-10-04 NOTE — Progress Notes (Signed)
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[facility]) He is doing well since last time I saw him His performance status score is still poor but his pancytopenia has improved dramatically Per previous discussion, I will put him on maintenance Revlimid Due to his significant comorbidities, I will trend his CBC only His CT imaging was not helpful and I am not willing to put him through another bone marrow biopsy unless it is absolutely necessary  Multiple myeloma not having achieved remission ([place]) I have drawn baseline myeloma panel We discussed the risk and benefits of maintenance Revlimid at low-dose 5 mg daily He is in agreement We will get insurance authorization with plan to start his treatment around August 10 I reminded his wife to take aspirin therapy He will continue acyclovir  Pancytopenia ([place]) His pancytopenia continues to improve since his last treatment He does not need transfusion support  Skin lesion He was told by his dermatologist to need resection of the abnormal skin lesion on his right forearm There is no contraindication for him to proceed  Orders Placed This Encounter  Procedures   Comprehensive metabolic panel    Standing Status:   Standing    Number of Occurrences:   33    Standing Expiration Date:   10/05/2022   CBC with Differential/Platelet    Standing Status:   Standing    Number of Occurrences:   22    Standing Expiration Date:   10/05/2022    All questions were answered. The patient knows to call the clinic with any problems, questions or concerns. The total time spent in the appointment was 40 minutes encounter with patients including review of chart and various tests results, discussions about plan of care and coordination of care plan   Heath Lark, MD 10/04/2021 10:29 AM  INTERVAL HISTORY: Please see below for problem oriented charting. he returns for treatment follow-up with his wife He is weak He has slept continuously for several days recently due to profound fatigue His overall performance status score remain poor despite improvement of his blood count No recent bleeding His appetite is stable and he has gained some weight He was told by his dermatologist that he need resection of a skin lesion on his forearm  REVIEW OF SYSTEMS:   Constitutional: Denies fevers, chills or abnormal weight loss Eyes: Denies blurriness of vision Ears, nose, mouth, throat, and face: Denies mucositis or sore throat Respiratory: Denies cough, dyspnea or wheezes Cardiovascular: Denies palpitation, chest discomfort or lower extremity swelling Gastrointestinal:  Denies nausea, heartburn or change in bowel habits Lymphatics: Denies new lymphadenopathy or easy bruising Behavioral/Psych: Mood is stable, no new changes  All other systems were reviewed with the patient and are negative.  I have reviewed the past medical history, past surgical history, social history and family history with the patient and they are unchanged from previous note.  ALLERGIES:  has No Known Allergies.  MEDICATIONS:  Current Outpatient Medications  Medication Sig Dispense Refill   aspirin EC 81 MG tablet Take 81 mg by mouth daily. Swallow whole.     lenalidomide (REVLIMID) 5 MG capsule Take 1 capsule (5 mg total) by mouth daily. 28 capsule 0   traMADol (ULTRAM) 50 MG tablet Take 1 tablet (50 mg total) by mouth every 6 (six) hours as  needed. 30 tablet 0   acetaminophen (TYLENOL) 325 MG tablet Take 650 mg by mouth every 6 (six) hours as needed for moderate pain.     acyclovir (ZOVIRAX) 400 MG tablet Take 1 tablet (400 mg total) by mouth daily. 30 tablet 6   atorvastatin (LIPITOR) 40 MG tablet TAKE ONE (1) TABLET BY MOUTH EVERY DAY AT 6PM. (Patient taking differently:  Take 20 mg by mouth every other day.) 30 tablet 5   B Complex-Folic Acid (B COMPLEX PLUS) TABS Take 1 tablet by mouth daily. 100 tablet 3   Cholecalciferol (VITAMIN D3) 50 MCG (2000 UT) capsule Take 1 capsule (2,000 Units total) by mouth daily. 100 capsule 3   donepezil (ARICEPT) 10 MG tablet Take 1 tablet (10 mg total) by mouth at bedtime. 90 tablet 3   megestrol (MEGACE) 40 MG tablet Take 1 tablet (40 mg total) by mouth 2 (two) times daily. 60 tablet 5   memantine (NAMENDA) 10 MG tablet Take 1 tablet (10 mg total) by mouth 2 (two) times daily. 60 tablet 11   Misc Natural Products (PROSTATE HEALTH) CAPS Take 1 capsule by mouth in the morning and at bedtime.     oxybutynin (DITROPAN) 5 MG tablet Take 1 tablet (5 mg total) by mouth at bedtime as needed. 30 tablet 3   phenazopyridine (PYRIDIUM) 100 MG tablet Take 1 tablet (100 mg total) by mouth at bedtime as needed for pain. 30 tablet 1   polyethylene glycol (MIRALAX) 17 g packet Take 17 g by mouth daily. 30 each 1   tamsulosin (FLOMAX) 0.4 MG CAPS capsule Take 1 capsule (0.4 mg total) by mouth daily. 30 capsule 6   No current facility-administered medications for this visit.    SUMMARY OF ONCOLOGIC HISTORY: Oncology History Overview Note  Normal cytogenetics, BCL-2 rearrangement by FISH is positive, neg for BCL-6 or cMYC  Myeloma FISH panel showed gain chromosome 11 and deletion 1p   High grade B-cell lymphoma ([place])  06/12/2021 Imaging   1. No evidence for primary malignancy or metastatic disease in the chest, abdomen, or pelvis. 2. Subtle edema/inflammation in the sigmoid mesocolon in the region of an ill-defined diverticulum. Imaging features consistent with sigmoid diverticulitis. No perforation or abscess. 3. Prostatomegaly with evidence of median lobe hypertrophy generating mass-effect on the inferior bladder. 4. Nonacute left-sided rib fractures. 5. 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 6. Aortic  Atherosclerosis (ICD10-I70.0) and Emphysema (ICD10-J43.9).   06/14/2021 Bone Marrow Biopsy   BONE MARROW, ASPIRATE, CLOT, CORE:  -Hypercellular bone marrow with extensive involvement by a B-cell lymphoproliferative disorder  -Monoclonal plasma cell population identified  -See comment   PERIPHERAL BLOOD:  -Pancytopenia   COMMENT:   The bone marrow shows extensive involvement by a high-grade B-cell lymphoproliferative disorder displaying dim surface lambda light chain expression and bright CD45 consistent with involvement by high-grade B-cell lymphoma.  In addition, there is an admixed monoclonal plasma cell population representing 20% of all cells in the aspirate and displaying kappa light chain restriction consistent with a neoplastic process.  Since the lymphoid and the plasma cell components have different light chain expression, they appear unrelated and may represent two distinct disease processes.  Nonetheless, correlation with cytogenetic and FISH studies is recommended   06/18/2021 Initial Diagnosis   High grade B-cell lymphoma ([place])   06/18/2021 Cancer Staging   Staging form: Hodgkin and Non-Hodgkin Lymphoma, AJCC 8th Edition - Clinical stage from 06/18/2021: Stage IV (Unknown) - Signed by Heath Lark, MD on  06/18/2021 Stage prefix: Initial diagnosis   06/24/2021 Procedure   Placement of a subcutaneous power-injectable port device. Catheter tip at the superior cavoatrial junction.   07/01/2021 Echocardiogram   1. Hypokinesis of the basal inferolateral wall with overall low normal LV function.  2. Left ventricular ejection fraction, by estimation, is 50 to 55%. The left ventricle has low normal function. The left ventricle demonstrates regional wall motion abnormalities (see scoring diagram/findings for description). There is mild left ventricular hypertrophy of the basal-septal segment. Left ventricular diastolic parameters are consistent with Grade I diastolic dysfunction (impaired  relaxation). Elevated left atrial pressure.  3. Right ventricular systolic function is normal. The right ventricular size is normal.  4. The mitral valve is normal in structure. Mild mitral valve regurgitation. No evidence of mitral stenosis.  5. The aortic valve is tricuspid. Aortic valve regurgitation is trivial. Aortic valve sclerosis is present, with no evidence of aortic valve stenosis.  6. Aortic dilatation noted. There is mild dilatation of the aortic root, measuring 41 mm.     07/02/2021 - 09/03/2021 Chemotherapy   Patient is on Treatment Plan : NON-HODGKINS LYMPHOMA R-CHOP q21d     Multiple myeloma not having achieved remission ([place])  06/18/2021 Initial Diagnosis   Multiple myeloma not having achieved remission ([place])   06/18/2021 Cancer Staging   Staging form: Plasma Cell Myeloma and Plasma Cell Disorders, AJCC 8th Edition - Clinical stage from 06/18/2021: Albumin (g/dL): 3.1, ISS: Stage II, High-risk cytogenetics: Unknown, LDH: Elevated - Signed by Heath Lark, MD on 06/18/2021 Stage prefix: Initial diagnosis Albumin range (g/dL): Less than 3.5 Cytogenetics: Unknown     PHYSICAL EXAMINATION: ECOG PERFORMANCE STATUS: 3 - Symptomatic, >50% confined to bed  Vitals:   10/04/21 0958  BP: 127/85  Pulse: (!) 119  Resp: 18  Temp: 97.8 F (36.6 C)  SpO2: 96%   Filed Weights   10/04/21 0958  Weight: 146 lb (66.2 kg)    GENERAL:alert, no distress and comfortable SKIN: He has suspicious skin lesion on the right forearm NEURO: alert & oriented x 3 with fluent speech, 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  LABORATORY DATA:  I have reviewed the data as listed    Component Value Date/Time   NA 142 10/04/2021 0854   K 3.8 10/04/2021 0854   CL 107 10/04/2021 0854   CO2 29 10/04/2021 0854   GLUCOSE 166 (H) 10/04/2021 0854   BUN 18 10/04/2021 0854   CREATININE 0.82 10/04/2021 0854   CALCIUM 9.7 10/04/2021 0854   PROT 7.1 10/04/2021 0854   ALBUMIN 4.0 10/04/2021 0854   AST  21 10/04/2021 0854   ALT 17 10/04/2021 0854   ALKPHOS 84 10/04/2021 0854   BILITOT 0.4 10/04/2021 0854   GFRNONAA >60 10/04/2021 0854   GFRAA >60 04/25/2019 0528    No results found for: "SPEP", "UPEP"  Lab Results  Component Value Date   WBC 4.5 10/04/2021   NEUTROABS 2.4 10/04/2021   HGB 11.5 (L) 10/04/2021   HCT 34.6 (L) 10/04/2021   MCV 118.9 (H) 10/04/2021   PLT 130 (L) 10/04/2021      Chemistry      Component Value Date/Time   NA 142 10/04/2021 0854   K 3.8 10/04/2021 0854   CL 107 10/04/2021 0854   CO2 29 10/04/2021 0854   BUN 18 10/04/2021 0854   CREATININE 0.82 10/04/2021 0854      Component Value Date/Time   CALCIUM 9.7 10/04/2021 0854   ALKPHOS 84 10/04/2021 0854   AST  21 10/04/2021 0854   ALT 17 10/04/2021 0854   BILITOT 0.4 10/04/2021 0854

## 2021-10-04 NOTE — Telephone Encounter (Signed)
Oral Oncology Patient Advocate Encounter  Was successful in securing patient a $12,000 grant from [employer] to provide copayment coverage for Lenalidomide.  This will keep the out of pocket expense at $0.     [employer] ID: [id number]  I have spoken with the patient.   The billing information is as follows and has been shared with [place].    RxBin: [id number] PCN: [id number] Member ID: [id number] Group ID: [id number] Dates of Eligibility: 09/04/2021 through 09/04/2022  Fund:  Kathryn, CPhT-Adv Pharmacy Patient Advocate Specialist [place] Patient Advocate Team Direct Number: [phone]  Fax: [phone]

## 2021-10-04 NOTE — Assessment & Plan Note (Signed)
He is doing well since last time I saw him His performance status score is still poor but his pancytopenia has improved dramatically Per previous discussion, I will put him on maintenance Revlimid Due to his significant comorbidities, I will trend his CBC only His CT imaging was not helpful and I am not willing to put him through another bone marrow biopsy unless it is absolutely necessary 

## 2021-10-04 NOTE — Telephone Encounter (Signed)
Oral Oncology Patient Advocate Encounter   Received notification that prior authorization for Lenalidomide is required.   PA submitted on 10/04/2021 Key [id number] Status is pending     Lady Deutscher, CPhT-Adv Pharmacy Patient Advocate Specialist [place] Patient Advocate Team Direct Number: [phone]  Fax: [phone]

## 2021-10-04 NOTE — Assessment & Plan Note (Signed)
His pancytopenia continues to improve since his last treatment He does not need transfusion support

## 2021-10-04 NOTE — Assessment & Plan Note (Signed)
He was told by his dermatologist to need resection of the abnormal skin lesion on his right forearm There is no contraindication for him to proceed

## 2021-10-04 NOTE — Assessment & Plan Note (Signed)
I have drawn baseline myeloma panel We discussed the risk and benefits of maintenance Revlimid at low-dose 5 mg daily He is in agreement We will get insurance authorization with plan to start his treatment around August 10 I reminded his wife to take aspirin therapy He will continue acyclovir

## 2021-10-04 NOTE — Telephone Encounter (Signed)
Oral Oncology Patient Advocate Encounter  Prior Authorization for Lenalidomide has been approved.    PA# [id number] Effective dates: 10/04/2021 through 03/06/2022  Patients co-pay is $5,390.77.    Lady Deutscher, CPhT-Adv Pharmacy Patient Advocate Specialist [place] Patient Advocate Team Direct Number: [phone]  Fax: [phone]

## 2021-10-04 NOTE — Telephone Encounter (Signed)
Oral Oncology Pharmacist Encounter  Received new prescription for lenalidomide (Revlimid) for the treatment of multiple myeloma as maintenance therapy due to performance status, planned duration until disease progression or unacceptable toxicity.  Labs from 10/04/21 assessed, no interventions needed. Patient has a PS of 3 resulting in low dose revlimid use.   Current medication list in Epic reviewed, DDIs with Revlimid identified: none  Evaluated chart and no patient barriers to medication adherence noted.   Patient agreement for treatment documented in MD note on 10/04/2021.  Prescription has been e-scribed to the [facility] for benefits analysis and approval.  Oral Oncology Clinic will continue to follow for insurance authorization, copayment issues, initial counseling and start date.  Drema Halon, PharmD Hematology/Oncology Clinical Pharmacist [facility] [phone] 10/04/2021 1:17 PM

## 2021-10-05 LAB — KAPPA/LAMBDA LIGHT CHAINS
Kappa free light chain: 484.2 mg/L — ABNORMAL HIGH (ref 3.3–19.4)
Kappa, lambda light chain ratio: 62.88 — ABNORMAL HIGH (ref 0.26–1.65)
Lambda free light chains: 7.7 mg/L (ref 5.7–26.3)

## 2021-10-05 MED ORDER — LENALIDOMIDE 5 MG PO CAPS: 5.0000 mg | ORAL_CAPSULE | Freq: Every day | ORAL | 0 refills | Status: DC

## 2021-10-06 LAB — MULTIPLE MYELOMA PANEL, SERUM
Albumin SerPl Elph-Mcnc: 3.4 g/dL (ref 2.9–4.4)
Albumin/Glob SerPl: 1.1 (ref 0.7–1.7)
Alpha 1: 0.2 g/dL (ref 0.0–0.4)
Alpha2 Glob SerPl Elph-Mcnc: 1.2 g/dL — ABNORMAL HIGH (ref 0.4–1.0)
B-Globulin SerPl Elph-Mcnc: 0.7 g/dL (ref 0.7–1.3)
Gamma Glob SerPl Elph-Mcnc: 1.1 g/dL (ref 0.4–1.8)
Globulin, Total: 3.2 g/dL (ref 2.2–3.9)
IgA: 31 mg/dL — ABNORMAL LOW (ref 61–437)
IgG (Immunoglobin G), Serum: 1173 mg/dL (ref 603–1613)
IgM (Immunoglobulin M), Srm: 7 mg/dL — ABNORMAL LOW (ref 15–143)
M Protein SerPl Elph-Mcnc: 0.8 g/dL — ABNORMAL HIGH
Total Protein ELP: 6.6 g/dL (ref 6.0–8.5)

## 2021-10-08 NOTE — Telephone Encounter (Signed)
Oral Chemotherapy Pharmacist Encounter  I spoke with patient for overview of: Revlimid for the treatment of advanced multiple myeloma as a single agent, planned duration until disease progression or unacceptable toxicity.   Counseled patient on administration, dosing, side effects, monitoring, drug-food interactions, safe handling, storage, and disposal.  Patient will take Revlimid 95m capsules, 1 capsule by mouth once daily, without regard to food, with a full glass of water.  Revlimid start date: 10/14/2021  Adverse effects of Revlimid include but are not limited to: nausea, constipation, diarrhea, abdominal pain, rash, fatigue, drug fever, and decreased blood counts.    Reviewed with patient importance of keeping a medication schedule and plan for any missed doses. No barriers to medication adherence identified.  Medication reconciliation performed and medication/allergy list updated.  Patient is continuing the aspirin and acyclovir. Patient counseled on importance of daily aspirin 863mfor VTE prophylaxis.  Insurance authorization for Revlimid has been obtained.  Revlimid prescription is being dispensed from [facility] it is a limited distribution medication.  All questions answered.  Henry [name] wife voiced understanding and appreciation.   Medication education handout placed in mail for patient. Patient knows to call the office with questions or concerns. Oral Chemotherapy Clinic phone number provided to patient.   KaDrema HalonPharmD Hematology/Oncology Clinical Pharmacist [phone]/06/2021    11:03 AM

## 2021-10-11 ENCOUNTER — Other Ambulatory Visit (HOSPITAL_COMMUNITY): Payer: Self-pay

## 2021-10-12 ENCOUNTER — Ambulatory Visit: Payer: Self-pay | Admitting: Licensed Clinical Social Worker

## 2021-10-12 DIAGNOSIS — H25813 Combined forms of age-related cataract, bilateral: Secondary | ICD-10-CM | POA: Diagnosis not present

## 2021-10-12 DIAGNOSIS — H5340 Unspecified visual field defects: Secondary | ICD-10-CM | POA: Diagnosis not present

## 2021-10-12 DIAGNOSIS — H52223 Regular astigmatism, bilateral: Secondary | ICD-10-CM | POA: Diagnosis not present

## 2021-10-12 DIAGNOSIS — H524 Presbyopia: Secondary | ICD-10-CM | POA: Diagnosis not present

## 2021-10-12 NOTE — Patient Outreach (Signed)
  Care Coordination   10/12/2021 Name: Henry [name] MRN: [id number] DOB: 1941-01-09   Care Coordination Outreach Attempts:  Contact was made with the patient today to offer care coordination services as a benefit of their health plan. The patient requested a return call on a later date.   Follow Up Plan:  Additional outreach attempts will be made to offer the patient care coordination information and services.  Appointment scheduled Aug. 9th at 10:00  Encounter Outcome:  Pt. Scheduled  Care Coordination Interventions Activated:  No   Care Coordination Interventions:  No, not indicated    Casimer Lanius, [place] [phone]

## 2021-10-13 ENCOUNTER — Ambulatory Visit: Payer: Self-pay | Admitting: Licensed Clinical Social Worker

## 2021-10-13 ENCOUNTER — Telehealth: Payer: Self-pay | Admitting: *Deleted

## 2021-10-13 DIAGNOSIS — R269 Unspecified abnormalities of gait and mobility: Secondary | ICD-10-CM

## 2021-10-13 DIAGNOSIS — Z8673 Personal history of transient ischemic attack (TIA), and cerebral infarction without residual deficits: Secondary | ICD-10-CM

## 2021-10-13 NOTE — Patient Instructions (Signed)
Visit Information  Thank you for taking time to visit with me today. Please don't hesitate to contact me if I can be of assistance to you.   Following are the goals we discussed today: Getting a wheelchair  Goals Addressed             This Visit's Progress    COMPLETED: I would like a wheelchair       Care Coordination Interventions: Solution-Focused Strategies employed:  Active listening / Reflection utilized  Collaborated with PCP and CMA to address needs for obtaining a wheelchair and informed them about patient's recent falls.         Please call the care guide team at [phone] if you need to schedule an appointment with me.   If you are experiencing a Mental Health or [place] or need someone to talk to, please call 1-800-273-TALK (toll free, 24 hour hotline)   Patient verbalizes understanding of instructions and care plan provided today and agrees to view in [place]. Active MyChart status and patient understanding of how to access instructions and care plan via MyChart confirmed with patient.     No further follow up required: by Care Coordination, you declined ongoing follow up  Casimer Lanius, [place] [phone]

## 2021-10-13 NOTE — Patient Outreach (Signed)
  Care Coordination  Initial Visit Note   10/13/2021 Name: ESTIL VALLEE MRN: [id number] DOB: Mar 24, 1940  [name] is a 81 y.o. year old male who sees [name], [name], MD for primary care. I spoke with  [name] by phone today  What matters to the patients health and wellness today?  Getting a wheelchair; Patient reports no concerns or needs with health and wellness related to physical or mental heath.  Patient was accompanied by his wife who provided information during this encounter. Reports patient had 4 falls in one week  Reminded patient and wife of all up coming appointments They Declined to schedule AWV  .   Recommendation: Patient may benefit from, and is in agreement to allow LCSW to inform PCP of recent falls send message to PCP about request for wheelchair.    Goals Addressed             This Visit's Progress    COMPLETED: I would like a wheelchair       Care Coordination Interventions: Solution-Focused Strategies employed:  Active listening / Reflection utilized  Collaborated with PCP and CMA to address needs for obtaining a wheelchair and informed them about patient's recent falls.        SDOH assessments and interventions completed:  Yes  SDOH Interventions Today    Flowsheet Row Most Recent Value  SDOH Interventions   Food Insecurity Interventions Intervention Not Indicated  Financial Strain Interventions Intervention Not Indicated  Housing Interventions Intervention Not Indicated  Transportation Interventions Intervention Not Indicated       Care Coordination Interventions Activated:  Yes  Care Coordination Interventions:  Yes, provided   Follow up plan: No further intervention required. Referral made to PCP for patient's request for a wheelchair    Encounter Outcome:  Pt. Visit Completed   Casimer Lanius, [place] [phone]

## 2021-10-13 NOTE — Telephone Encounter (Signed)
-----   Message from Maurine Cane, LCSW sent at 10/13/2021 10:32 AM EDT ----- Spoke with patient and wife today.  He had 4 falls in one week. Most recent fall was yesterday.  Reports sore tail bone.  Patient and wife would like to get a wheelchair.  I informed them I would share this information with you.

## 2021-10-14 NOTE — Telephone Encounter (Signed)
Pt has appt 10/19/21.Marland KitchenJohny [name]

## 2021-10-15 NOTE — Telephone Encounter (Signed)
Called pt/wife no answer LMOM rx for walker ready for wheelchair.Marland Kitchenlmb

## 2021-10-20 DIAGNOSIS — C44622 Squamous cell carcinoma of skin of right upper limb, including shoulder: Secondary | ICD-10-CM | POA: Diagnosis not present

## 2021-10-22 ENCOUNTER — Encounter: Payer: Self-pay | Admitting: Hematology and Oncology

## 2021-10-22 ENCOUNTER — Other Ambulatory Visit: Payer: Self-pay

## 2021-10-22 ENCOUNTER — Inpatient Hospital Stay: Payer: Medicare HMO | Attending: Hematology and Oncology

## 2021-10-22 ENCOUNTER — Inpatient Hospital Stay (HOSPITAL_BASED_OUTPATIENT_CLINIC_OR_DEPARTMENT_OTHER): Payer: Medicare HMO | Admitting: Hematology and Oncology

## 2021-10-22 DIAGNOSIS — Z79899 Other long term (current) drug therapy: Secondary | ICD-10-CM | POA: Insufficient documentation

## 2021-10-22 DIAGNOSIS — D61818 Other pancytopenia: Secondary | ICD-10-CM | POA: Diagnosis not present

## 2021-10-22 DIAGNOSIS — R351 Nocturia: Secondary | ICD-10-CM

## 2021-10-22 DIAGNOSIS — C8510 Unspecified B-cell lymphoma, unspecified site: Secondary | ICD-10-CM | POA: Diagnosis not present

## 2021-10-22 DIAGNOSIS — C9000 Multiple myeloma not having achieved remission: Secondary | ICD-10-CM

## 2021-10-22 DIAGNOSIS — L989 Disorder of the skin and subcutaneous tissue, unspecified: Secondary | ICD-10-CM | POA: Insufficient documentation

## 2021-10-22 LAB — CBC WITH DIFFERENTIAL/PLATELET
Abs Immature Granulocytes: 0.11 10*3/uL — ABNORMAL HIGH (ref 0.00–0.07)
Basophils Absolute: 0 10*3/uL (ref 0.0–0.1)
Basophils Relative: 0 %
Eosinophils Absolute: 0.3 10*3/uL (ref 0.0–0.5)
Eosinophils Relative: 5 %
HCT: 34.2 % — ABNORMAL LOW (ref 39.0–52.0)
Hemoglobin: 11.4 g/dL — ABNORMAL LOW (ref 13.0–17.0)
Immature Granulocytes: 2 %
Lymphocytes Relative: 40 %
Lymphs Abs: 2.1 10*3/uL (ref 0.7–4.0)
MCH: 39.6 pg — ABNORMAL HIGH (ref 26.0–34.0)
MCHC: 33.3 g/dL (ref 30.0–36.0)
MCV: 118.8 fL — ABNORMAL HIGH (ref 80.0–100.0)
Monocytes Absolute: 0.9 10*3/uL (ref 0.1–1.0)
Monocytes Relative: 17 %
Neutro Abs: 1.9 10*3/uL (ref 1.7–7.7)
Neutrophils Relative %: 36 %
Platelets: 132 10*3/uL — ABNORMAL LOW (ref 150–400)
RBC: 2.88 MIL/uL — ABNORMAL LOW (ref 4.22–5.81)
RDW: 15.9 % — ABNORMAL HIGH (ref 11.5–15.5)
WBC: 5.3 10*3/uL (ref 4.0–10.5)
nRBC: 0 % (ref 0.0–0.2)

## 2021-10-22 LAB — COMPREHENSIVE METABOLIC PANEL
ALT: 26 U/L (ref 0–44)
AST: 17 U/L (ref 15–41)
Albumin: 3.7 g/dL (ref 3.5–5.0)
Alkaline Phosphatase: 72 U/L (ref 38–126)
Anion gap: 6 (ref 5–15)
BUN: 24 mg/dL — ABNORMAL HIGH (ref 8–23)
CO2: 25 mmol/L (ref 22–32)
Calcium: 10.3 mg/dL (ref 8.9–10.3)
Chloride: 111 mmol/L (ref 98–111)
Creatinine, Ser: 0.95 mg/dL (ref 0.61–1.24)
GFR, Estimated: >60 mL/min (ref >60)
Glucose, Bld: 146 mg/dL — ABNORMAL HIGH (ref 70–99)
Potassium: 3.9 mmol/L (ref 3.5–5.1)
Sodium: 142 mmol/L (ref 135–145)
Total Bilirubin: 0.5 mg/dL (ref 0.3–1.2)
Total Protein: 6.8 g/dL (ref 6.5–8.1)

## 2021-10-22 MED ORDER — POLYETHYLENE GLYCOL 3350 17 G PO PACK: 17.0000 g | PACK | Freq: Every day | ORAL | 1 refills | Status: AC

## 2021-10-22 MED ORDER — HEPARIN SOD (PORK) LOCK FLUSH 100 UNIT/ML IV SOLN
500.0000 [IU] | Freq: Once | INTRAVENOUS | Status: AC
  Administered 2021-10-22: 500 [IU]

## 2021-10-22 MED ORDER — SODIUM CHLORIDE 0.9% FLUSH
10.0000 mL | Freq: Once | INTRAVENOUS | Status: AC
  Administered 2021-10-22: 10 mL

## 2021-10-22 NOTE — Assessment & Plan Note (Signed)
He had recent surgical resection I will defer to dermatologist for management

## 2021-10-22 NOTE — Assessment & Plan Note (Signed)
He has frequent nocturia causing sleep disturbances He was prescribed Flomax without benefit He is also on Ditropan I recommend referral to urologist for management His wife will discuss this with his primary care doctor

## 2021-10-22 NOTE — Progress Notes (Signed)
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[facility]) We discussed the role of maintenance lenalidomide For now, I do not recommend bone marrow biopsy His pancytopenia has actually improved  Multiple myeloma not having achieved remission ([place]) His recent serum protein electrophoresis was actually slightly worse than his baseline We will continue lenalidomide along with aspirin therapy He will continue acyclovir for antimicrobial prophylaxis   Pancytopenia ([place]) His pancytopenia continues to improve since his last treatment He does not need transfusion support  Skin lesion He had recent surgical resection I will defer to dermatologist for management  Nocturia He has frequent nocturia causing sleep disturbances He was prescribed Flomax without benefit He is also on Ditropan I recommend referral to urologist for management His wife will discuss this with his primary care doctor  No orders of the defined types were placed in this encounter.   All questions were answered. The patient knows to call the clinic with any problems, questions or concerns. The total time spent in the appointment was 30 minutes encounter with patients including review of chart and various tests results, discussions about plan of care and coordination of care plan   Heath Lark, MD 10/22/2021 6:25 PM  INTERVAL HISTORY: Please see below for problem oriented charting. he returns for treatment follow-up on single agent lenalidomide His wife provided most of the history He has frequent nocturia, waking up every 30 minutes to urinate at night This is disturbing his sleep and he sleeps during daytime His appetite is fair He has completed recent skin excision for skin cancer He has no recent excessive  bleeding  REVIEW OF SYSTEMS:   Constitutional: Denies fevers, chills or abnormal weight loss Eyes: Denies blurriness of vision Ears, nose, mouth, throat, and face: Denies mucositis or sore throat Respiratory: Denies cough, dyspnea or wheezes Cardiovascular: Denies palpitation, chest discomfort or lower extremity swelling Gastrointestinal:  Denies nausea, heartburn or change in bowel habits Skin: Denies abnormal skin rashes Lymphatics: Denies new lymphadenopathy or easy bruising Neurological:Denies numbness, tingling or new weaknesses Behavioral/Psych: Mood is stable, no new changes  All other systems were reviewed with the patient and are negative.  I have reviewed the past medical history, past surgical history, social history and family history with the patient and they are unchanged from previous note.  ALLERGIES:  has No Known Allergies.  MEDICATIONS:  Current Outpatient Medications  Medication Sig Dispense Refill   acetaminophen (TYLENOL) 325 MG tablet Take 650 mg by mouth every 6 (six) hours as needed for moderate pain.     acyclovir (ZOVIRAX) 400 MG tablet Take 1 tablet (400 mg total) by mouth daily. 30 tablet 6   aspirin EC 81 MG tablet Take 81 mg by mouth daily. Swallow whole.     atorvastatin (LIPITOR) 40 MG tablet TAKE ONE (1) TABLET BY MOUTH EVERY DAY AT 6PM. (Patient taking differently: Take 20 mg by mouth every other day.) 30 tablet 5   B Complex-Folic Acid (B COMPLEX PLUS) TABS Take 1 tablet by mouth daily. 100 tablet 3   Cholecalciferol (VITAMIN D3) 50 MCG (2000 UT) capsule Take 1 capsule (2,000 Units total) by mouth daily. 100 capsule 3   donepezil (ARICEPT) 10 MG tablet Take 1 tablet (10 mg total) by mouth at bedtime. 90 tablet 3   lenalidomide (REVLIMID) 5 MG capsule  Take 1 capsule (5 mg total) by mouth daily. 28 capsule 0   megestrol (MEGACE) 40 MG tablet Take 1 tablet (40 mg total) by mouth 2 (two) times daily. 60 tablet 5   memantine (NAMENDA) 10 MG tablet Take 1  tablet (10 mg total) by mouth 2 (two) times daily. 60 tablet 11   Misc Natural Products (PROSTATE HEALTH) CAPS Take 1 capsule by mouth in the morning and at bedtime.     oxybutynin (DITROPAN) 5 MG tablet Take 1 tablet (5 mg total) by mouth at bedtime as needed. 30 tablet 3   phenazopyridine (PYRIDIUM) 100 MG tablet Take 1 tablet (100 mg total) by mouth at bedtime as needed for pain. 30 tablet 1   polyethylene glycol (MIRALAX) 17 g packet Take 17 g by mouth daily. 30 each 1   tamsulosin (FLOMAX) 0.4 MG CAPS capsule Take 1 capsule (0.4 mg total) by mouth daily. 30 capsule 6   traMADol (ULTRAM) 50 MG tablet Take 1 tablet (50 mg total) by mouth every 6 (six) hours as needed. 30 tablet 0   No current facility-administered medications for this visit.    SUMMARY OF ONCOLOGIC HISTORY: Oncology History Overview Note  Normal cytogenetics, BCL-2 rearrangement by FISH is positive, neg for BCL-6 or cMYC  Myeloma FISH panel showed gain chromosome 11 and deletion 1p   High grade B-cell lymphoma ([place])  06/12/2021 Imaging   1. No evidence for primary malignancy or metastatic disease in the chest, abdomen, or pelvis. 2. Subtle edema/inflammation in the sigmoid mesocolon in the region of an ill-defined diverticulum. Imaging features consistent with sigmoid diverticulitis. No perforation or abscess. 3. Prostatomegaly with evidence of median lobe hypertrophy generating mass-effect on the inferior bladder. 4. Nonacute left-sided rib fractures. 5. 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 6. Aortic Atherosclerosis (ICD10-I70.0) and Emphysema (ICD10-J43.9).   06/14/2021 Bone Marrow Biopsy   BONE MARROW, ASPIRATE, CLOT, CORE:  -Hypercellular bone marrow with extensive involvement by a B-cell lymphoproliferative disorder  -Monoclonal plasma cell population identified  -See comment   PERIPHERAL BLOOD:  -Pancytopenia   COMMENT:   The bone marrow shows extensive involvement by a  high-grade B-cell lymphoproliferative disorder displaying dim surface lambda light chain expression and bright CD45 consistent with involvement by high-grade B-cell lymphoma.  In addition, there is an admixed monoclonal plasma cell population representing 20% of all cells in the aspirate and displaying kappa light chain restriction consistent with a neoplastic process.  Since the lymphoid and the plasma cell components have different light chain expression, they appear unrelated and may represent two distinct disease processes.  Nonetheless, correlation with cytogenetic and FISH studies is recommended   06/18/2021 Initial Diagnosis   High grade B-cell lymphoma ([place])   06/18/2021 Cancer Staging   Staging form: Hodgkin and Non-Hodgkin Lymphoma, AJCC 8th Edition - Clinical stage from 06/18/2021: Stage IV (Unknown) - Signed by Heath Lark, MD on 06/18/2021 Stage prefix: Initial diagnosis   06/24/2021 Procedure   Placement of a subcutaneous power-injectable port device. Catheter tip at the superior cavoatrial junction.   07/01/2021 Echocardiogram   1. Hypokinesis of the basal inferolateral wall with overall low normal LV function.  2. Left ventricular ejection fraction, by estimation, is 50 to 55%. The left ventricle has low normal function. The left ventricle demonstrates regional wall motion abnormalities (see scoring diagram/findings for description). There is mild left ventricular hypertrophy of the basal-septal segment. Left ventricular diastolic parameters are consistent with Grade I diastolic dysfunction (impaired relaxation). Elevated left  atrial pressure.  3. Right ventricular systolic function is normal. The right ventricular size is normal.  4. The mitral valve is normal in structure. Mild mitral valve regurgitation. No evidence of mitral stenosis.  5. The aortic valve is tricuspid. Aortic valve regurgitation is trivial. Aortic valve sclerosis is present, with no evidence of aortic valve  stenosis.  6. Aortic dilatation noted. There is mild dilatation of the aortic root, measuring 41 mm.     07/02/2021 - 09/03/2021 Chemotherapy   Patient is on Treatment Plan : NON-HODGKINS LYMPHOMA R-CHOP q21d     Multiple myeloma not having achieved remission ([place])  06/18/2021 Initial Diagnosis   Multiple myeloma not having achieved remission ([place])   06/18/2021 Cancer Staging   Staging form: Plasma Cell Myeloma and Plasma Cell Disorders, AJCC 8th Edition - Clinical stage from 06/18/2021: Albumin (g/dL): 3.1, ISS: Stage II, High-risk cytogenetics: Unknown, LDH: Elevated - Signed by Heath Lark, MD on 06/18/2021 Stage prefix: Initial diagnosis Albumin range (g/dL): Less than 3.5 Cytogenetics: Unknown     PHYSICAL EXAMINATION: ECOG PERFORMANCE STATUS: 3 - Symptomatic, >50% confined to bed  Vitals:   10/22/21 1344  BP: 118/85  Pulse: (!) 109  Resp: 18  Temp: 97.7 F (36.5 C)  SpO2: 100%   There were no vitals filed for this visit.  GENERAL:alert, no distress and comfortable SKIN: Surgical site from recent skin biopsy is healing NEURO: alert & oriented x 3   LABORATORY DATA:  I have reviewed the data as listed    Component Value Date/Time   NA 142 10/22/2021 1309   K 3.9 10/22/2021 1309   CL 111 10/22/2021 1309   CO2 25 10/22/2021 1309   GLUCOSE 146 (H) 10/22/2021 1309   BUN 24 (H) 10/22/2021 1309   CREATININE 0.95 10/22/2021 1309   CREATININE 0.82 10/04/2021 0854   CALCIUM 10.3 10/22/2021 1309   PROT 6.8 10/22/2021 1309   ALBUMIN 3.7 10/22/2021 1309   AST 17 10/22/2021 1309   AST 21 10/04/2021 0854   ALT 26 10/22/2021 1309   ALT 17 10/04/2021 0854   ALKPHOS 72 10/22/2021 1309   BILITOT 0.5 10/22/2021 1309   BILITOT 0.4 10/04/2021 0854   GFRNONAA >60 10/22/2021 1309   GFRNONAA >60 10/04/2021 0854   GFRAA >60 04/25/2019 0528    No results found for: "SPEP", "UPEP"  Lab Results  Component Value Date   WBC 5.3 10/22/2021   NEUTROABS 1.9 10/22/2021   HGB 11.4  (L) 10/22/2021   HCT 34.2 (L) 10/22/2021   MCV 118.8 (H) 10/22/2021   PLT 132 (L) 10/22/2021      Chemistry      Component Value Date/Time   NA 142 10/22/2021 1309   K 3.9 10/22/2021 1309   CL 111 10/22/2021 1309   CO2 25 10/22/2021 1309   BUN 24 (H) 10/22/2021 1309   CREATININE 0.95 10/22/2021 1309   CREATININE 0.82 10/04/2021 0854      Component Value Date/Time   CALCIUM 10.3 10/22/2021 1309   ALKPHOS 72 10/22/2021 1309   AST 17 10/22/2021 1309   AST 21 10/04/2021 0854   ALT 26 10/22/2021 1309   ALT 17 10/04/2021 0854   BILITOT 0.5 10/22/2021 1309   BILITOT 0.4 10/04/2021 0854

## 2021-10-22 NOTE — Assessment & Plan Note (Signed)
We discussed the role of maintenance lenalidomide For now, I do not recommend bone marrow biopsy His pancytopenia has actually improved

## 2021-10-22 NOTE — Assessment & Plan Note (Signed)
His pancytopenia continues to improve since his last treatment He does not need transfusion support

## 2021-10-22 NOTE — Assessment & Plan Note (Signed)
His recent serum protein electrophoresis was actually slightly worse than his baseline We will continue lenalidomide along with aspirin therapy He will continue acyclovir for antimicrobial prophylaxis

## 2021-10-25 ENCOUNTER — Telehealth: Payer: Self-pay

## 2021-10-25 NOTE — Telephone Encounter (Signed)
Agree 

## 2021-10-25 NOTE — Telephone Encounter (Signed)
Returned call to wife. Einar is feeling constipated. Last bm last Thursday. He is taking Miralax daily and Senokot 2 tabs daily. Instructed to take Miralax BID and Senokot 2 tabs TID. Wife verbalized understanding and will call the office back in the am to give update or if he needs a Rx for laxative.

## 2021-10-26 ENCOUNTER — Other Ambulatory Visit: Payer: Self-pay

## 2021-10-26 DIAGNOSIS — C9000 Multiple myeloma not having achieved remission: Secondary | ICD-10-CM

## 2021-10-26 MED ORDER — LENALIDOMIDE 5 MG PO CAPS: 5.0000 mg | ORAL_CAPSULE | Freq: Every day | ORAL | 0 refills | Status: DC

## 2021-10-26 NOTE — Telephone Encounter (Signed)
Returned her call and left a message for her to call the office back.

## 2021-10-26 NOTE — Telephone Encounter (Signed)
Called and left a message asking them to call the office back. Received a message from the wife that Henry [name] has has x 2 bm's.

## 2021-10-26 NOTE — Telephone Encounter (Signed)
Wife called back. Henry [name] has had x 3 bm's today and will continue laxatives as needed. Wife will call the office back if needed.

## 2021-10-27 ENCOUNTER — Encounter: Payer: Self-pay | Admitting: Internal Medicine

## 2021-10-27 ENCOUNTER — Ambulatory Visit (INDEPENDENT_AMBULATORY_CARE_PROVIDER_SITE_OTHER): Payer: Medicare HMO | Admitting: Internal Medicine

## 2021-10-27 VITALS — BP 120/72 | HR 101 | Temp 99.4°F | Ht 69.0 in | Wt 145.4 lb

## 2021-10-27 DIAGNOSIS — C8510 Unspecified B-cell lymphoma, unspecified site: Secondary | ICD-10-CM | POA: Diagnosis not present

## 2021-10-27 DIAGNOSIS — R32 Unspecified urinary incontinence: Secondary | ICD-10-CM | POA: Diagnosis not present

## 2021-10-27 DIAGNOSIS — I69354 Hemiplegia and hemiparesis following cerebral infarction affecting left non-dominant side: Secondary | ICD-10-CM

## 2021-10-27 DIAGNOSIS — N400 Enlarged prostate without lower urinary tract symptoms: Secondary | ICD-10-CM

## 2021-10-27 DIAGNOSIS — R627 Adult failure to thrive: Secondary | ICD-10-CM | POA: Insufficient documentation

## 2021-10-27 DIAGNOSIS — W1800XA Striking against unspecified object with subsequent fall, initial encounter: Secondary | ICD-10-CM

## 2021-10-27 NOTE — Assessment & Plan Note (Addendum)
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

## 2021-10-27 NOTE — Assessment & Plan Note (Addendum)
D/c Namenda, Aricept Use Pyridium or Ditropan prn - side effects, risks discussed

## 2021-10-27 NOTE — Assessment & Plan Note (Signed)
Worse - multifactorial FTT Start PT/OT at home HHA if covered

## 2021-10-27 NOTE — Assessment & Plan Note (Signed)
Urology ref D/c Aricept, Namenda

## 2021-10-27 NOTE — Assessment & Plan Note (Signed)
On treatment.  

## 2021-10-27 NOTE — Progress Notes (Addendum)
Subjective:  Patient ID: Henry [name], male    DOB: 03-11-1940  Age: 81 y.o. MRN: [id number]  CC: Follow-up (6 week f/u)   HPI [employer] presents for urinary incontinence - not better, CVA, MM/ lymphoma, FTT  Outpatient Medications Prior to Visit  Medication Sig Dispense Refill   acetaminophen (TYLENOL) 325 MG tablet Take 650 mg by mouth every 6 (six) hours as needed for moderate pain.     acyclovir (ZOVIRAX) 400 MG tablet Take 1 tablet (400 mg total) by mouth daily. 30 tablet 6   aspirin EC 81 MG tablet Take 81 mg by mouth daily. Swallow whole.     atorvastatin (LIPITOR) 40 MG tablet TAKE ONE (1) TABLET BY MOUTH EVERY DAY AT 6PM. (Patient taking differently: Take 20 mg by mouth every other day.) 30 tablet 5   B Complex-Folic Acid (B COMPLEX PLUS) TABS Take 1 tablet by mouth daily. 100 tablet 3   Cholecalciferol (VITAMIN D3) 50 MCG (2000 UT) capsule Take 1 capsule (2,000 Units total) by mouth daily. 100 capsule 3   lenalidomide (REVLIMID) 5 MG capsule Take 1 capsule (5 mg total) by mouth daily. 28 capsule 0   megestrol (MEGACE) 40 MG tablet Take 1 tablet (40 mg total) by mouth 2 (two) times daily. 60 tablet 5   Misc Natural Products (PROSTATE HEALTH) CAPS Take 1 capsule by mouth in the morning and at bedtime.     polyethylene glycol (MIRALAX) 17 g packet Take 17 g by mouth daily. 30 each 1   tamsulosin (FLOMAX) 0.4 MG CAPS capsule Take 1 capsule (0.4 mg total) by mouth daily. 30 capsule 6   donepezil (ARICEPT) 10 MG tablet Take 1 tablet (10 mg total) by mouth at bedtime. 90 tablet 3   memantine (NAMENDA) 10 MG tablet Take 1 tablet (10 mg total) by mouth 2 (two) times daily. 60 tablet 11   traMADol (ULTRAM) 50 MG tablet Take 1 tablet (50 mg total) by mouth every 6 (six) hours as needed. 30 tablet 0   oxybutynin (DITROPAN) 5 MG tablet Take 1 tablet (5 mg total) by mouth at bedtime as needed. (Patient not taking: Reported on 10/27/2021) 30 tablet 3   phenazopyridine (PYRIDIUM) 100 MG  tablet Take 1 tablet (100 mg total) by mouth at bedtime as needed for pain. (Patient not taking: Reported on 10/27/2021) 30 tablet 1   No facility-administered medications prior to visit.    ROS: Review of Systems  Constitutional:  Positive for fatigue. Negative for appetite change and unexpected weight change.  HENT:  Negative for congestion, nosebleeds, sneezing, sore throat and trouble swallowing.   Eyes:  Negative for itching and visual disturbance.  Respiratory:  Negative for cough.   Cardiovascular:  Negative for chest pain, palpitations and leg swelling.  Gastrointestinal:  Negative for abdominal distention, blood in stool, diarrhea and nausea.  Genitourinary:  Negative for frequency and hematuria.  Musculoskeletal:  Positive for back pain and gait problem. Negative for joint swelling and neck pain.  Skin:  Negative for rash.  Neurological:  Positive for weakness. Negative for dizziness, tremors and speech difficulty.  Psychiatric/Behavioral:  Negative for agitation, dysphoric mood, sleep disturbance and suicidal ideas. The patient is not nervous/anxious.     Objective:  BP 120/72 (BP Location: Left Arm)   Pulse (!) 101   Temp 99.4 F (37.4 C) (Oral)   Ht '5\' 9"'$  (1.753 m)   Wt 145 lb 6.4 oz (66 kg)   SpO2 96%  BMI 21.47 kg/m   BP Readings from Last 3 Encounters:  11/09/21 121/74  11/01/21 113/75  10/27/21 120/72    Wt Readings from Last 3 Encounters:  11/09/21 149 lb 4.8 oz (67.7 kg)  11/01/21 145 lb 9.6 oz (66 kg)  10/27/21 145 lb 6.4 oz (66 kg)    Physical Exam Constitutional:      General: He is not in acute distress.    Appearance: He is well-developed.     Comments: NAD  Eyes:     Conjunctiva/sclera: Conjunctivae normal.     Pupils: Pupils are equal, round, and reactive to light.  Neck:     Thyroid: No thyromegaly.     Vascular: No JVD.  Cardiovascular:     Rate and Rhythm: Normal rate and regular rhythm.     Heart sounds: Normal heart sounds. No  murmur heard.    No friction rub. No gallop.  Pulmonary:     Effort: Pulmonary effort is normal. No respiratory distress.     Breath sounds: Normal breath sounds. No wheezing or rales.  Chest:     Chest wall: No tenderness.  Abdominal:     General: Bowel sounds are normal. There is no distension.     Palpations: Abdomen is soft. There is no mass.     Tenderness: There is no abdominal tenderness. There is no guarding or rebound.  Musculoskeletal:        General: No tenderness. Normal range of motion.     Cervical back: Normal range of motion.  Lymphadenopathy:     Cervical: No cervical adenopathy.  Skin:    General: Skin is warm and dry.     Findings: No rash.  Neurological:     Mental Status: He is alert and oriented to person, place, and time.     Cranial Nerves: No cranial nerve deficit.     Motor: No abnormal muscle tone.     Coordination: Coordination normal.     Gait: Gait normal.     Deep Tendon Reflexes: Reflexes are normal and symmetric.  Psychiatric:        Behavior: Behavior normal.        Thought Content: Thought content normal.        Judgment: Judgment normal.   Falling asleep in a w/c Post-op wound on R forearm L foot drop   A total time of 45 minutes was spent preparing to see the patient, reviewing tests, x-rays, operative reports and other medical records.  Also, obtaining history and performing comprehensive physical exam.  Additionally, counseling the patient regarding the above listed issues.   Finally, documenting clinical information in the health records, coordination of care, educating the patient - FFT, incontinence, CVA. It is a complex case.  Lab Results  Component Value Date   WBC 4.2 11/09/2021   HGB 12.1 (L) 11/09/2021   HCT 35.3 (L) 11/09/2021   PLT 121 (L) 11/09/2021   GLUCOSE 149 (H) 11/09/2021   CHOL 147 08/26/2019   TRIG 180.0 (H) 08/26/2019   HDL 41.80 08/26/2019   LDLDIRECT 135.0 02/24/2017   LDLCALC 70 08/26/2019   ALT 21  11/09/2021   AST 18 11/09/2021   NA 137 11/09/2021   K 3.8 11/09/2021   CL 103 11/09/2021   CREATININE 0.92 11/09/2021   BUN 23 11/09/2021   CO2 28 11/09/2021   TSH 1.384 06/12/2021   PSA 1.75 02/24/2017   INR 1.2 04/17/2019   HGBA1C 6.6 (H) 05/29/2019    ECHOCARDIOGRAM COMPLETE  Result Date: 06/30/2021    ECHOCARDIOGRAM REPORT   Patient Name:   CADE DASHNER Date of Exam: 06/30/2021 Medical Rec #:  [id number]       Height:       69.0 in Accession #:    [id number]      Weight:       139.0 lb Date of Birth:  [birth year]       BSA:          1.770 m Patient Age:    57 years        BP:           114/68 mmHg Patient Gender: M               HR:           83 bpm. Exam Location:  Outpatient Procedure: 2D Echo, Cardiac Doppler, Color Doppler and Strain Analysis Indications:    Chemo  History:        Patient has prior history of Echocardiogram examinations, most                 recent 04/17/2019. Stroke.  Sonographer:    Lee Vining Referring Phys: [id number] NI [place]  1. Hypokinesis of the basal inferolateral wall with overall low normal LV function.  2. Left ventricular ejection fraction, by estimation, is 50 to 55%. The left ventricle has low normal function. The left ventricle demonstrates regional wall motion abnormalities (see scoring diagram/findings for description). There is mild left ventricular hypertrophy of the basal-septal segment. Left ventricular diastolic parameters are consistent with Grade I diastolic dysfunction (impaired relaxation). Elevated left atrial pressure.  3. Right ventricular systolic function is normal. The right ventricular size is normal.  4. The mitral valve is normal in structure. Mild mitral valve regurgitation. No evidence of mitral stenosis.  5. The aortic valve is tricuspid. Aortic valve regurgitation is trivial. Aortic valve sclerosis is present, with no evidence of aortic valve stenosis.  6. Aortic dilatation noted. There is mild dilatation of the aortic  root, measuring 41 mm. FINDINGS  Left Ventricle: Left ventricular ejection fraction, by estimation, is 50 to 55%. The left ventricle has low normal function. The left ventricle demonstrates regional wall motion abnormalities. The left ventricular internal cavity size was normal in size. There is mild left ventricular hypertrophy of the basal-septal segment. Abnormal (paradoxical) septal motion, consistent with left bundle branch block. Left ventricular diastolic parameters are consistent with Grade I diastolic dysfunction (impaired relaxation). Elevated left atrial pressure. Right Ventricle: The right ventricular size is normal. Right ventricular systolic function is normal. Left Atrium: Left atrial size was normal in size. Right Atrium: Right atrial size was normal in size. Pericardium: Trivial pericardial effusion is present. Mitral Valve: The mitral valve is normal in structure. Mild mitral valve regurgitation. No evidence of mitral valve stenosis. Tricuspid Valve: The tricuspid valve is normal in structure. Tricuspid valve regurgitation is trivial. No evidence of tricuspid stenosis. Aortic Valve: The aortic valve is tricuspid. Aortic valve regurgitation is trivial. Aortic valve sclerosis is present, with no evidence of aortic valve stenosis. Aortic valve mean gradient measures 5.0 mmHg. Aortic valve peak gradient measures 8.3 mmHg. Aortic valve area, by VTI measures 2.40 cm. Pulmonic Valve: The pulmonic valve was normal in structure. Pulmonic valve regurgitation is not visualized. No evidence of pulmonic stenosis. Aorta: Aortic dilatation noted. There is mild dilatation of the aortic root, measuring 41 mm. Venous: The inferior vena cava was not well visualized. IAS/Shunts: The  interatrial septum was not well visualized. Additional Comments: Hypokinesis of the basal inferolateral wall with overall low normal LV function.  LEFT VENTRICLE PLAX 2D LVIDd:         3.90 cm     Diastology LVIDs:         3.40 cm     LV  e' medial:    3.59 cm/s LV PW:         1.10 cm     LV E/e' medial:  17.7 LV IVS:        1.30 cm     LV e' lateral:   7.18 cm/s LVOT diam:     2.10 cm     LV E/e' lateral: 8.8 LV SV:         55 LV SV Index:   31 LVOT Area:     3.46 cm  LV Volumes (MOD) LV vol d, MOD A2C: 56.4 ml LV vol d, MOD A4C: 61.9 ml LV vol s, MOD A2C: 29.5 ml LV vol s, MOD A4C: 30.9 ml LV SV MOD A2C:     26.9 ml LV SV MOD A4C:     61.9 ml LV SV MOD BP:      31.5 ml RIGHT VENTRICLE RV Basal diam:  2.90 cm RV Mid diam:    2.60 cm RV S prime:     10.80 cm/s TAPSE (M-mode): 1.2 cm LEFT ATRIUM           Index LA Vol (A4C): 32.3 ml 18.25 ml/m  AORTIC VALVE                    PULMONIC VALVE AV Area (Vmax):    2.30 cm     PV Vmax:       0.72 m/s AV Area (Vmean):   2.14 cm     PV Vmean:      48.600 cm/s AV Area (VTI):     2.40 cm     PV VTI:        0.122 m AV Vmax:           144.00 cm/s  PV Peak grad:  2.1 mmHg AV Vmean:          99.900 cm/s  PV Mean grad:  1.0 mmHg AV VTI:            0.231 m AV Peak Grad:      8.3 mmHg AV Mean Grad:      5.0 mmHg LVOT Vmax:         95.70 cm/s LVOT Vmean:        61.800 cm/s LVOT VTI:          0.160 m LVOT/AV VTI ratio: 0.69  AORTA Ao Root diam: 3.10 cm Ao Asc diam:  3.30 cm MITRAL VALVE                TRICUSPID VALVE MV Area (PHT): 3.65 cm     TR Peak grad:   19.7 mmHg MV Decel Time: 208 msec     TR Vmax:        222.00 cm/s MV E velocity: 63.40 cm/s MV A velocity: 102.00 cm/s  SHUNTS MV E/A ratio:  0.62         Systemic VTI:  0.16 m                             Systemic Diam: 2.10 cm Kirk Ruths MD  Electronically signed by Kirk Ruths MD Signature Date/Time: 06/30/2021/1:19:02 PM    Final     Assessment & Plan:   Problem List Items Addressed This Visit     BPH (benign prostatic hyperplasia)    Urology ref D/c Aricept, Namenda      Fall against object    Needs AFO Ankle foot Orthosis for drop foot L Dx: Z86.73, R26.9 and I69.354       FTT (failure to thrive) in adult    Worse - multifactorial  FTT Start PT/OT at home HHA if covered      Relevant Orders   Ambulatory referral to [place]   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     Follow-up: Return in about 6 weeks (around 12/08/2021) for a follow-up visit.  Walker Kehr, MD

## 2021-10-27 NOTE — Patient Instructions (Signed)
Stop Aricept, Namenda

## 2021-11-01 ENCOUNTER — Inpatient Hospital Stay: Payer: Medicare HMO

## 2021-11-01 ENCOUNTER — Other Ambulatory Visit: Payer: Self-pay

## 2021-11-01 ENCOUNTER — Inpatient Hospital Stay: Payer: Medicare HMO | Admitting: Hematology and Oncology

## 2021-11-01 DIAGNOSIS — D61818 Other pancytopenia: Secondary | ICD-10-CM

## 2021-11-01 DIAGNOSIS — R351 Nocturia: Secondary | ICD-10-CM | POA: Diagnosis not present

## 2021-11-01 DIAGNOSIS — F03918 Unspecified dementia, unspecified severity, with other behavioral disturbance: Secondary | ICD-10-CM | POA: Diagnosis not present

## 2021-11-01 DIAGNOSIS — L989 Disorder of the skin and subcutaneous tissue, unspecified: Secondary | ICD-10-CM

## 2021-11-01 DIAGNOSIS — Z79899 Other long term (current) drug therapy: Secondary | ICD-10-CM | POA: Diagnosis not present

## 2021-11-01 DIAGNOSIS — C9000 Multiple myeloma not having achieved remission: Secondary | ICD-10-CM

## 2021-11-01 DIAGNOSIS — C8510 Unspecified B-cell lymphoma, unspecified site: Secondary | ICD-10-CM | POA: Diagnosis not present

## 2021-11-01 LAB — COMPREHENSIVE METABOLIC PANEL
ALT: 26 U/L (ref 0–44)
AST: 17 U/L (ref 15–41)
Albumin: 4.1 g/dL (ref 3.5–5.0)
Alkaline Phosphatase: 71 U/L (ref 38–126)
Anion gap: 6 (ref 5–15)
BUN: 14 mg/dL (ref 8–23)
CO2: 28 mmol/L (ref 22–32)
Calcium: 10.5 mg/dL — ABNORMAL HIGH (ref 8.9–10.3)
Chloride: 105 mmol/L (ref 98–111)
Creatinine, Ser: 0.75 mg/dL (ref 0.61–1.24)
GFR, Estimated: >60 mL/min (ref >60)
Glucose, Bld: 94 mg/dL (ref 70–99)
Potassium: 4 mmol/L (ref 3.5–5.1)
Sodium: 139 mmol/L (ref 135–145)
Total Bilirubin: 0.6 mg/dL (ref 0.3–1.2)
Total Protein: 6.8 g/dL (ref 6.5–8.1)

## 2021-11-01 LAB — CBC WITH DIFFERENTIAL/PLATELET
Abs Immature Granulocytes: 0.04 10*3/uL (ref 0.00–0.07)
Basophils Absolute: 0 10*3/uL (ref 0.0–0.1)
Basophils Relative: 1 %
Eosinophils Absolute: 0.3 10*3/uL (ref 0.0–0.5)
Eosinophils Relative: 8 %
HCT: 34.6 % — ABNORMAL LOW (ref 39.0–52.0)
Hemoglobin: 11.8 g/dL — ABNORMAL LOW (ref 13.0–17.0)
Immature Granulocytes: 1 %
Lymphocytes Relative: 55 %
Lymphs Abs: 2.2 10*3/uL (ref 0.7–4.0)
MCH: 39.6 pg — ABNORMAL HIGH (ref 26.0–34.0)
MCHC: 34.1 g/dL (ref 30.0–36.0)
MCV: 116.1 fL — ABNORMAL HIGH (ref 80.0–100.0)
Monocytes Absolute: 0.6 10*3/uL (ref 0.1–1.0)
Monocytes Relative: 14 %
Neutro Abs: 0.8 10*3/uL — ABNORMAL LOW (ref 1.7–7.7)
Neutrophils Relative %: 21 %
Platelets: 112 10*3/uL — ABNORMAL LOW (ref 150–400)
RBC: 2.98 MIL/uL — ABNORMAL LOW (ref 4.22–5.81)
RDW: 15.5 % (ref 11.5–15.5)
WBC: 4.1 10*3/uL (ref 4.0–10.5)
nRBC: 0 % (ref 0.0–0.2)

## 2021-11-01 MED ORDER — HEPARIN SOD (PORK) LOCK FLUSH 100 UNIT/ML IV SOLN
500.0000 [IU] | Freq: Once | INTRAVENOUS | Status: AC
  Administered 2021-11-01: 500 [IU]

## 2021-11-01 MED ORDER — SODIUM CHLORIDE 0.9% FLUSH
10.0000 mL | Freq: Once | INTRAVENOUS | Status: AC
  Administered 2021-11-01: 10 mL

## 2021-11-02 ENCOUNTER — Encounter: Payer: Self-pay | Admitting: Hematology and Oncology

## 2021-11-02 ENCOUNTER — Telehealth: Payer: Self-pay

## 2021-11-02 DIAGNOSIS — Z8673 Personal history of transient ischemic attack (TIA), and cerebral infarction without residual deficits: Secondary | ICD-10-CM

## 2021-11-02 DIAGNOSIS — I69354 Hemiplegia and hemiparesis following cerebral infarction affecting left non-dominant side: Secondary | ICD-10-CM

## 2021-11-02 DIAGNOSIS — R269 Unspecified abnormalities of gait and mobility: Secondary | ICD-10-CM

## 2021-11-02 NOTE — Assessment & Plan Note (Signed)
His pancytopenia continues to improve since his last treatment He does not need transfusion support

## 2021-11-02 NOTE — Assessment & Plan Note (Signed)
We discussed the role of maintenance lenalidomide For now, I do not recommend bone marrow biopsy His pancytopenia has actually improved 

## 2021-11-02 NOTE — Progress Notes (Signed)
[facility] OFFICE PROGRESS NOTE  Patient Care Team: [name], [name], MD as PCP - General Carlean Purl Ofilia Neas, MD (Gastroenterology) Kristeen Miss, MD as Consulting Physician (Neurosurgery)  ASSESSMENT & PLAN:  High grade B-cell lymphoma [facility]) We discussed the role of maintenance lenalidomide For now, I do not recommend bone marrow biopsy His pancytopenia has actually improved  Multiple myeloma not having achieved remission ([place]) His recent serum protein electrophoresis was actually slightly worse than his baseline We will continue lenalidomide along with aspirin therapy He will continue acyclovir for antimicrobial prophylaxis I will offer Zometa treatment in the future once his performance status improves  Pancytopenia ([place]) His pancytopenia continues to improve since his last treatment He does not need transfusion support  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   All questions were answered. The patient knows to call the clinic with any problems, questions or concerns. The total time spent in the appointment was 30 minutes encounter with patients including review of chart and various tests results, discussions about plan of care and coordination of care plan   Heath Lark, MD 11/02/2021 8:21 AM  INTERVAL HISTORY: Please see below for problem oriented charting. he returns for treatment follow-up with his wife on maintenance Revlimid for both multiple myeloma and diffuse large B-cell lymphoma His wife  stated the patient continues to have excessive fatigue and spent a lot of time lying around She noticed some rash at the back of his legs His appetite is fair His recent constipation has resolved  REVIEW OF SYSTEMS:   Constitutional: Denies fevers, chills or abnormal weight loss Eyes: Denies blurriness of vision Ears, nose, mouth, throat, and face: Denies mucositis or sore throat Respiratory: Denies cough, dyspnea or wheezes Cardiovascular: Denies palpitation, chest discomfort or lower extremity swelling Lymphatics: Denies new lymphadenopathy or easy bruising Behavioral/Psych: Mood is stable, no new changes  All other systems were reviewed with the patient and are negative.  I have reviewed the past medical history, past surgical history, social history and family history with the patient and they are unchanged from previous note.  ALLERGIES:  is allergic to aricept [donepezil] and namenda [memantine].  MEDICATIONS:  Current Outpatient Medications  Medication Sig Dispense Refill   acetaminophen (TYLENOL) 325 MG tablet Take 650 mg by mouth every 6 (six) hours as needed for moderate pain.     acyclovir (ZOVIRAX) 400 MG tablet Take 1 tablet (400 mg total) by mouth daily. 30 tablet 6   aspirin EC 81 MG tablet Take 81 mg by mouth daily. Swallow whole.     atorvastatin (LIPITOR) 40 MG tablet TAKE ONE (1) TABLET BY MOUTH EVERY DAY AT 6PM. (Patient taking differently: Take 20 mg by mouth every other day.) 30 tablet 5   B Complex-Folic Acid (B COMPLEX PLUS) TABS Take 1 tablet by mouth daily. 100 tablet 3   Cholecalciferol (VITAMIN D3) 50 MCG (2000 UT) capsule Take 1 capsule (2,000 Units total) by mouth daily. 100 capsule 3   lenalidomide (REVLIMID) 5 MG capsule Take 1  capsule (5 mg total) by mouth daily. 28 capsule 0   megestrol (MEGACE) 40 MG tablet Take 1 tablet (40 mg total) by mouth 2 (two) times daily. 60 tablet 5   Misc Natural Products (PROSTATE HEALTH) CAPS Take 1 capsule by mouth in the  morning and at bedtime.     polyethylene glycol (MIRALAX) 17 g packet Take 17 g by mouth daily. 30 each 1   tamsulosin (FLOMAX) 0.4 MG CAPS capsule Take 1 capsule (0.4 mg total) by mouth daily. 30 capsule 6   traMADol (ULTRAM) 50 MG tablet Take 1 tablet (50 mg total) by mouth every 6 (six) hours as needed. 30 tablet 0   No current facility-administered medications for this visit.    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 No evidence for primary malignancy or metastatic disease in the chest, abdomen, or pelvis. 2. Subtle edema/inflammation in the sigmoid mesocolon in the region of an ill-defined diverticulum. Imaging features consistent with sigmoid diverticulitis. No perforation or abscess. 3. Prostatomegaly with evidence of median lobe hypertrophy generating mass-effect on the inferior bladder. 4. Nonacute left-sided rib fractures. 5. 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 6. Aortic Atherosclerosis (ICD10-I70.0) and Emphysema (ICD10-J43.9).   06/14/2021 Bone Marrow Biopsy   BONE MARROW, ASPIRATE, CLOT, CORE:  -Hypercellular bone marrow with extensive involvement by a B-cell lymphoproliferative disorder  -Monoclonal plasma cell population identified  -See comment   PERIPHERAL BLOOD:  -Pancytopenia   COMMENT:   The bone marrow shows extensive involvement by a high-grade B-cell lymphoproliferative disorder displaying dim surface lambda light chain expression and bright CD45 consistent with involvement by high-grade B-cell lymphoma.  In addition, there is an admixed monoclonal plasma cell population representing 20% of all cells in the aspirate and displaying kappa light chain restriction consistent with a neoplastic process.   Since the lymphoid and the plasma cell components have different light chain expression, they appear unrelated and may represent two distinct disease processes.  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 Catheter tip at the superior cavoatrial junction.   07/01/2021 Echocardiogram   1. Hypokinesis of the basal inferolateral wall with overall low normal LV function.  2. Left ventricular ejection fraction, by estimation, is 50 to 55%. The left ventricle has low normal function. The left ventricle demonstrates regional wall motion abnormalities (see scoring diagram/findings for description). There is mild left ventricular hypertrophy of the basal-septal segment. Left ventricular diastolic parameters are consistent with Grade I diastolic dysfunction (impaired relaxation). Elevated left atrial pressure.  3. Right ventricular systolic function is normal. The right ventricular size is normal.  4. The mitral valve is normal in structure. Mild mitral valve regurgitation. No evidence of mitral stenosis.  5. The aortic valve is tricuspid. Aortic valve regurgitation is trivial. Aortic valve sclerosis is present, with no evidence of aortic valve stenosis.  6. Aortic dilatation noted. There is mild dilatation of the aortic  root, measuring 41 mm.     07/02/2021 - 09/03/2021 Chemotherapy   Patient is on Treatment Plan : NON-HODGKINS LYMPHOMA R-CHOP q21d     Multiple myeloma not having achieved remission ([place])  06/18/2021 Initial Diagnosis   Multiple myeloma not having achieved remission ([place])   06/18/2021 Cancer  Staging   Staging form: Plasma Cell Myeloma and Plasma Cell Disorders, AJCC 8th Edition - Clinical stage from 06/18/2021: Albumin (g/dL): 3.1, ISS: Stage II, High-risk cytogenetics: Unknown, LDH: Elevated - Signed by Heath Lark, MD on 06/18/2021 Stage prefix: Initial diagnosis Albumin range (g/dL): Less than 3.5 Cytogenetics: Unknown     PHYSICAL EXAMINATION: ECOG PERFORMANCE STATUS: 2 - Symptomatic, <50% confined to bed  Vitals:   11/01/21 0938  BP: 113/75  Pulse: 93  Resp: 18  Temp: 97.8 F (36.6 C)  SpO2: 98%   Filed Weights   11/01/21 0938  Weight: 145 lb 9.6 oz (66 kg)    GENERAL:alert, no distress and comfortable SKIN: I do not appreciate any particular rash.  He has dry skin and seborrheic keratosis throughout. NEURO: alert & oriented x 3 with fluent speech, no focal motor/sensory deficits  LABORATORY DATA:  I have reviewed the data as listed    Component Value Date/Time   NA 139 11/01/2021 0921   K 4.0 11/01/2021 0921   CL 105 11/01/2021 0921   CO2 28 11/01/2021 0921   GLUCOSE 94 11/01/2021 0921   BUN 14 11/01/2021 0921   CREATININE 0.75 11/01/2021 0921   CREATININE 0.82 10/04/2021 0854   CALCIUM 10.5 (H) 11/01/2021 0921   PROT 6.8 11/01/2021 0921   ALBUMIN 4.1 11/01/2021 0921   AST 17 11/01/2021 0921   AST 21 10/04/2021 0854   ALT 26 11/01/2021 0921   ALT 17 10/04/2021 0854   ALKPHOS 71 11/01/2021 0921   BILITOT 0.6 11/01/2021 0921   BILITOT 0.4 10/04/2021 0854   GFRNONAA >60 11/01/2021 0921   GFRNONAA >60 10/04/2021 0854   GFRAA >60 04/25/2019 0528    No results found for: "SPEP", "UPEP"  Lab Results  Component Value Date   WBC 4.1 11/01/2021   NEUTROABS 0.8 (L) 11/01/2021   HGB 11.8 (L) 11/01/2021   HCT 34.6 (L) 11/01/2021   MCV 116.1 (H) 11/01/2021   PLT 112 (L) 11/01/2021      Chemistry      Component Value Date/Time   NA 139 11/01/2021 0921   K 4.0 11/01/2021 0921   CL 105 11/01/2021 0921   CO2 28 11/01/2021 0921   BUN 14  11/01/2021 0921   CREATININE 0.75 11/01/2021 0921   CREATININE 0.82 10/04/2021 0854      Component Value Date/Time   CALCIUM 10.5 (H) 11/01/2021 0921   ALKPHOS 71 11/01/2021 0921   AST 17 11/01/2021 0921   AST 21 10/04/2021 0854   ALT 26 11/01/2021 0921   ALT 17 10/04/2021 0854   BILITOT 0.6 11/01/2021 0921   BILITOT 0.4 10/04/2021 0854

## 2021-11-02 NOTE — Assessment & Plan Note (Signed)
The patient has dementia and stroke He is very debilitated and have very poor performance status I believe his excessive fatigue is not related to his current treatment

## 2021-11-02 NOTE — Assessment & Plan Note (Signed)
His recent serum protein electrophoresis was actually slightly worse than his baseline We will continue lenalidomide along with aspirin therapy He will continue acyclovir for antimicrobial prophylaxis I will offer Zometa treatment in the future once his performance status improves

## 2021-11-02 NOTE — Telephone Encounter (Signed)
Pt will received PT for: 1 time a week for 1 week 2 times a week for 3 weeks 1 time a week for 3 weeks  Also Home health aid for 1 time a week for 3 weeks  Elane Fritz is requesting a order for AFO Ankle foot Orthosis for drop foot to be sent to [facility].  Please advise

## 2021-11-02 NOTE — Assessment & Plan Note (Signed)
He was diagnosed with skin cancer and has follow-up with dermatologist His wife noticed some rash behind his legs I do not believe this is related to Revlimid and I recommend for her to reach out to his dermatologist

## 2021-11-03 NOTE — Telephone Encounter (Signed)
Okay.  Thanks.

## 2021-11-03 NOTE — Telephone Encounter (Signed)
Called lana no answer LMOM w/ MD response. Faxed over DME for AFO to [employer] to [phone]...[name]

## 2021-11-04 ENCOUNTER — Other Ambulatory Visit: Payer: Self-pay | Admitting: Hematology and Oncology

## 2021-11-04 DIAGNOSIS — C9000 Multiple myeloma not having achieved remission: Secondary | ICD-10-CM

## 2021-11-05 ENCOUNTER — Telehealth: Payer: Self-pay | Admitting: Internal Medicine

## 2021-11-05 NOTE — Telephone Encounter (Signed)
[facility] called and said that patient is refusing home health services at this time.  Just FYI

## 2021-11-09 ENCOUNTER — Other Ambulatory Visit: Payer: Self-pay

## 2021-11-09 ENCOUNTER — Encounter: Payer: Self-pay | Admitting: Hematology and Oncology

## 2021-11-09 ENCOUNTER — Telehealth: Payer: Self-pay | Admitting: *Deleted

## 2021-11-09 ENCOUNTER — Inpatient Hospital Stay: Payer: Medicare HMO | Admitting: Hematology and Oncology

## 2021-11-09 ENCOUNTER — Inpatient Hospital Stay: Payer: Medicare HMO | Attending: Hematology and Oncology

## 2021-11-09 DIAGNOSIS — R21 Rash and other nonspecific skin eruption: Secondary | ICD-10-CM | POA: Insufficient documentation

## 2021-11-09 DIAGNOSIS — C9000 Multiple myeloma not having achieved remission: Secondary | ICD-10-CM | POA: Diagnosis not present

## 2021-11-09 DIAGNOSIS — I69354 Hemiplegia and hemiparesis following cerebral infarction affecting left non-dominant side: Secondary | ICD-10-CM

## 2021-11-09 DIAGNOSIS — D61818 Other pancytopenia: Secondary | ICD-10-CM

## 2021-11-09 DIAGNOSIS — Z79899 Other long term (current) drug therapy: Secondary | ICD-10-CM | POA: Diagnosis not present

## 2021-11-09 DIAGNOSIS — C8510 Unspecified B-cell lymphoma, unspecified site: Secondary | ICD-10-CM | POA: Diagnosis not present

## 2021-11-09 LAB — CBC WITH DIFFERENTIAL/PLATELET
Abs Immature Granulocytes: 0.01 10*3/uL (ref 0.00–0.07)
Basophils Absolute: 0 10*3/uL (ref 0.0–0.1)
Basophils Relative: 1 %
Eosinophils Absolute: 0.4 10*3/uL (ref 0.0–0.5)
Eosinophils Relative: 10 %
HCT: 35.3 % — ABNORMAL LOW (ref 39.0–52.0)
Hemoglobin: 12.1 g/dL — ABNORMAL LOW (ref 13.0–17.0)
Immature Granulocytes: 0 %
Lymphocytes Relative: 62 %
Lymphs Abs: 2.6 10*3/uL (ref 0.7–4.0)
MCH: 39.8 pg — ABNORMAL HIGH (ref 26.0–34.0)
MCHC: 34.3 g/dL (ref 30.0–36.0)
MCV: 116.1 fL — ABNORMAL HIGH (ref 80.0–100.0)
Monocytes Absolute: 0.6 10*3/uL (ref 0.1–1.0)
Monocytes Relative: 14 %
Neutro Abs: 0.6 10*3/uL — ABNORMAL LOW (ref 1.7–7.7)
Neutrophils Relative %: 13 %
Platelets: 121 10*3/uL — ABNORMAL LOW (ref 150–400)
RBC: 3.04 MIL/uL — ABNORMAL LOW (ref 4.22–5.81)
RDW: 15.2 % (ref 11.5–15.5)
WBC: 4.2 10*3/uL (ref 4.0–10.5)
nRBC: 0 % (ref 0.0–0.2)

## 2021-11-09 LAB — COMPREHENSIVE METABOLIC PANEL
ALT: 21 U/L (ref 0–44)
AST: 18 U/L (ref 15–41)
Albumin: 4.2 g/dL (ref 3.5–5.0)
Alkaline Phosphatase: 72 U/L (ref 38–126)
Anion gap: 6 (ref 5–15)
BUN: 23 mg/dL (ref 8–23)
CO2: 28 mmol/L (ref 22–32)
Calcium: 10.7 mg/dL — ABNORMAL HIGH (ref 8.9–10.3)
Chloride: 103 mmol/L (ref 98–111)
Creatinine, Ser: 0.92 mg/dL (ref 0.61–1.24)
GFR, Estimated: >60 mL/min (ref >60)
Glucose, Bld: 149 mg/dL — ABNORMAL HIGH (ref 70–99)
Potassium: 3.8 mmol/L (ref 3.5–5.1)
Sodium: 137 mmol/L (ref 135–145)
Total Bilirubin: 0.5 mg/dL (ref 0.3–1.2)
Total Protein: 6.9 g/dL (ref 6.5–8.1)

## 2021-11-09 MED ORDER — HEPARIN SOD (PORK) LOCK FLUSH 100 UNIT/ML IV SOLN
500.0000 [IU] | Freq: Once | INTRAVENOUS | Status: AC
  Administered 2021-11-09: 500 [IU]

## 2021-11-09 MED ORDER — SODIUM CHLORIDE 0.9% FLUSH
10.0000 mL | Freq: Once | INTRAVENOUS | Status: AC
  Administered 2021-11-09: 10 mL

## 2021-11-09 NOTE — Assessment & Plan Note (Signed)
Repeat serum protein electrophoresis is pending We will continue lenalidomide along with aspirin therapy He will continue acyclovir for antimicrobial prophylaxis I will offer Zometa treatment in the future once his performance status improves

## 2021-11-09 NOTE — Assessment & Plan Note (Signed)
We discussed the role of maintenance lenalidomide For now, I do not recommend bone marrow biopsy His pancytopenia has actually improved 

## 2021-11-09 NOTE — Assessment & Plan Note (Signed)
The patient has dementia and stroke He is very debilitated and have very poor performance status but not from his treatment now The pancytopenia is not the cause of his excessive fatigue I recommend PT and rehab as tolerated

## 2021-11-09 NOTE — Assessment & Plan Note (Signed)
His pancytopenia continues to improve since his last treatment He does not need transfusion support

## 2021-11-09 NOTE — Telephone Encounter (Signed)
Dr. Alain Marion on Mr. Whetzel rx for wheelchair you will need to add narrative into your notes   RE: Aviel Davalos Received: Today Vara Guardian, RMA; Stephannie Peters Unfortunate insurance will not take that. I'll go ahead and do the order as long as he can put that narrative in when gets back.        Previous Messages    ----- Message -----  From: Earnstine Regal, RMA  Sent: 11/09/2021   1:42 PM EDT  To: Stephannie Peters  Subject: Alberteen Sam.. can you use a phone note with the diagnosis on it because Dr. Alain Marion is out of the office until Monday 9/11  ----- Message -----  From: Stephannie Peters  Sent: 11/09/2021   1:25 PM EDT  To: Earnstine Regal, RMA   Hey Ellisha Bankson! We received and order for a WC for [employer] May 03, 2042. We are needing a narrative for that. The narrative that's in the order is perfect, it just needs to be out in a separate note. Thanks! Here is my number if you have any questions [phone] :)

## 2021-11-09 NOTE — Progress Notes (Signed)
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[facility]) We discussed the role of maintenance lenalidomide For now, I do not recommend bone marrow biopsy His pancytopenia has actually improved  Multiple myeloma not having achieved remission ([place]) Repeat serum protein electrophoresis is pending We will continue lenalidomide along with aspirin therapy He will continue acyclovir for antimicrobial prophylaxis I will offer Zometa treatment in the future once his performance status improves  Pancytopenia ([place]) His pancytopenia continues to improve since his last treatment He does not need transfusion support  Hemiparesis affecting left side as late effect of stroke [facility]) The patient has dementia and stroke He is very debilitated and have very poor performance status but not from his treatment now The pancytopenia is not the cause of his excessive fatigue I recommend PT and rehab as tolerated  No orders of the defined types were placed in this encounter.   All questions were answered. The patient knows to call the clinic with any problems, questions or concerns. The total time spent in the appointment was 30 minutes encounter with patients including review of chart and various tests results, discussions about plan of care and coordination of care plan   Heath Lark, MD 11/09/2021 5:24 PM  INTERVAL HISTORY: Please see below for problem oriented charting. he returns for treatment follow-up with his wife He is on maintenance Revlimid His PCP has recently adjusted some of his meds He continues to sleep more than 12 hours a day No recent constipation His skin is healing well from recent surgery  REVIEW OF SYSTEMS:   Constitutional: Denies fevers, chills or abnormal weight loss Eyes:  Denies blurriness of vision Ears, nose, mouth, throat, and face: Denies mucositis or sore throat Respiratory: Denies cough, dyspnea or wheezes Cardiovascular: Denies palpitation, chest discomfort or lower extremity swelling Gastrointestinal:  Denies nausea, heartburn or change in bowel habits Skin: Denies abnormal skin rashes Lymphatics: Denies new lymphadenopathy or easy bruising Behavioral/Psych: Mood is stable, no new changes  All other systems were reviewed with the patient and are negative.  I have reviewed the past medical history, past surgical history, social history and family history with the patient and they are unchanged from previous note.  ALLERGIES:  is allergic to aricept [donepezil] and namenda [memantine].  MEDICATIONS:  Current Outpatient Medications  Medication Sig Dispense Refill   acetaminophen (TYLENOL) 325 MG tablet Take 650 mg by mouth every 6 (six) hours as needed for moderate pain.     acyclovir (ZOVIRAX) 400 MG tablet Take 1 tablet (400 mg total) by mouth daily. 30 tablet 6   aspirin EC 81 MG tablet Take 81 mg by mouth daily. Swallow whole.     atorvastatin (LIPITOR) 40 MG tablet TAKE ONE (1) TABLET BY MOUTH EVERY DAY AT 6PM. (Patient taking differently: Take 20 mg by mouth every other day.) 30 tablet 5   B Complex-Folic Acid (B COMPLEX PLUS) TABS Take 1 tablet by mouth daily. 100 tablet 3   Cholecalciferol (VITAMIN D3) 50 MCG (2000 UT) capsule Take 1 capsule (2,000 Units total) by mouth daily. 100 capsule 3   lenalidomide (REVLIMID) 5 MG capsule Take 1 capsule (5 mg total) by mouth daily. 28 capsule 0   megestrol (MEGACE) 40 MG tablet Take 1 tablet (40 mg total) by mouth 2 (two) times daily. 60 tablet  5   Misc Natural Products (PROSTATE HEALTH) CAPS Take 1 capsule by mouth in the morning and at bedtime.     polyethylene glycol (MIRALAX) 17 g packet Take 17 g by mouth daily. 30 each 1   tamsulosin (FLOMAX) 0.4 MG CAPS capsule Take 1 capsule (0.4 mg total) by  mouth daily. 30 capsule 6   No current facility-administered medications for this visit.    SUMMARY OF ONCOLOGIC HISTORY: Oncology History Overview Note  Normal cytogenetics, BCL-2 rearrangement by FISH is positive, neg for BCL-6 or cMYC  Myeloma FISH panel showed gain chromosome 11 and deletion 1p   High grade B-cell lymphoma ([place])  06/12/2021 Imaging   1. No evidence for primary malignancy or metastatic disease in the chest, abdomen, or pelvis. 2. Subtle edema/inflammation in the sigmoid mesocolon in the region of an ill-defined diverticulum. Imaging features consistent with sigmoid diverticulitis. No perforation or abscess. 3. Prostatomegaly with evidence of median lobe hypertrophy generating mass-effect on the inferior bladder. 4. Nonacute left-sided rib fractures. 5. 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 6. Aortic Atherosclerosis (ICD10-I70.0) and Emphysema (ICD10-J43.9).   06/14/2021 Bone Marrow Biopsy   BONE MARROW, ASPIRATE, CLOT, CORE:  -Hypercellular bone marrow with extensive involvement by a B-cell lymphoproliferative disorder  -Monoclonal plasma cell population identified  -See comment   PERIPHERAL BLOOD:  -Pancytopenia   COMMENT:   The bone marrow shows extensive involvement by a high-grade B-cell lymphoproliferative disorder displaying dim surface lambda light chain expression and bright CD45 consistent with involvement by high-grade B-cell lymphoma.  In addition, there is an admixed monoclonal plasma cell population representing 20% of all cells in the aspirate and displaying kappa light chain restriction consistent with a neoplastic process.  Since the lymphoid and the plasma cell components have different light chain expression, they appear unrelated and may represent two distinct disease processes.  Nonetheless, correlation with cytogenetic and FISH studies is recommended   06/18/2021 Initial Diagnosis   High grade B-cell lymphoma  ([place])   06/18/2021 Cancer Staging   Staging form: Hodgkin and Non-Hodgkin Lymphoma, AJCC 8th Edition - Clinical stage from 06/18/2021: Stage IV (Unknown) - Signed by Heath Lark, MD on 06/18/2021 Stage prefix: Initial diagnosis   06/24/2021 Procedure   Placement of a subcutaneous power-injectable port device. Catheter tip at the superior cavoatrial junction.   07/01/2021 Echocardiogram   1. Hypokinesis of the basal inferolateral wall with overall low normal LV function.  2. Left ventricular ejection fraction, by estimation, is 50 to 55%. The left ventricle has low normal function. The left ventricle demonstrates regional wall motion abnormalities (see scoring diagram/findings for description). There is mild left ventricular hypertrophy of the basal-septal segment. Left ventricular diastolic parameters are consistent with Grade I diastolic dysfunction (impaired relaxation). Elevated left atrial pressure.  3. Right ventricular systolic function is normal. The right ventricular size is normal.  4. The mitral valve is normal in structure. Mild mitral valve regurgitation. No evidence of mitral stenosis.  5. The aortic valve is tricuspid. Aortic valve regurgitation is trivial. Aortic valve sclerosis is present, with no evidence of aortic valve stenosis.  6. Aortic dilatation noted. There is mild dilatation of the aortic root, measuring 41 mm.     07/02/2021 - 09/03/2021 Chemotherapy   Patient is on Treatment Plan : NON-HODGKINS LYMPHOMA R-CHOP q21d     Multiple myeloma not having achieved remission ([place])  06/18/2021 Initial Diagnosis   Multiple myeloma not having achieved remission ([place])   06/18/2021 Cancer Staging  Staging form: Plasma Cell Myeloma and Plasma Cell Disorders, AJCC 8th Edition - Clinical stage from 06/18/2021: Albumin (g/dL): 3.1, ISS: Stage II, High-risk cytogenetics: Unknown, LDH: Elevated - Signed by Heath Lark, MD on 06/18/2021 Stage prefix: Initial diagnosis Albumin range (g/dL):  Less than 3.5 Cytogenetics: Unknown     PHYSICAL EXAMINATION: ECOG PERFORMANCE STATUS: 3 - Symptomatic, >50% confined to bed  Vitals:   11/09/21 1425  BP: 121/74  Pulse: (!) 112  Resp: 17  Temp: (!) 97.4 F (36.3 C)  SpO2: 100%   Filed Weights   11/09/21 1425  Weight: 149 lb 4.8 oz (67.7 kg)    GENERAL:alert, no distress and comfortable NEURO: alert & oriented x 3 with fluent speech  LABORATORY DATA:  I have reviewed the data as listed    Component Value Date/Time   NA 137 11/09/2021 1402   K 3.8 11/09/2021 1402   CL 103 11/09/2021 1402   CO2 28 11/09/2021 1402   GLUCOSE 149 (H) 11/09/2021 1402   BUN 23 11/09/2021 1402   CREATININE 0.92 11/09/2021 1402   CREATININE 0.82 10/04/2021 0854   CALCIUM 10.7 (H) 11/09/2021 1402   PROT 6.9 11/09/2021 1402   ALBUMIN 4.2 11/09/2021 1402   AST 18 11/09/2021 1402   AST 21 10/04/2021 0854   ALT 21 11/09/2021 1402   ALT 17 10/04/2021 0854   ALKPHOS 72 11/09/2021 1402   BILITOT 0.5 11/09/2021 1402   BILITOT 0.4 10/04/2021 0854   GFRNONAA >60 11/09/2021 1402   GFRNONAA >60 10/04/2021 0854   GFRAA >60 04/25/2019 0528    No results found for: "SPEP", "UPEP"  Lab Results  Component Value Date   WBC 4.2 11/09/2021   NEUTROABS 0.6 (L) 11/09/2021   HGB 12.1 (L) 11/09/2021   HCT 35.3 (L) 11/09/2021   MCV 116.1 (H) 11/09/2021   PLT 121 (L) 11/09/2021      Chemistry      Component Value Date/Time   NA 137 11/09/2021 1402   K 3.8 11/09/2021 1402   CL 103 11/09/2021 1402   CO2 28 11/09/2021 1402   BUN 23 11/09/2021 1402   CREATININE 0.92 11/09/2021 1402   CREATININE 0.82 10/04/2021 0854      Component Value Date/Time   CALCIUM 10.7 (H) 11/09/2021 1402   ALKPHOS 72 11/09/2021 1402   AST 18 11/09/2021 1402   AST 21 10/04/2021 0854   ALT 21 11/09/2021 1402   ALT 17 10/04/2021 0854   BILITOT 0.5 11/09/2021 1402   BILITOT 0.4 10/04/2021 0854

## 2021-11-10 LAB — KAPPA/LAMBDA LIGHT CHAINS
Kappa free light chain: 304.5 mg/L — ABNORMAL HIGH (ref 3.3–19.4)
Kappa, lambda light chain ratio: 18.68 — ABNORMAL HIGH (ref 0.26–1.65)
Lambda free light chains: 16.3 mg/L (ref 5.7–26.3)

## 2021-11-13 NOTE — Telephone Encounter (Signed)
Noted! Thank you

## 2021-11-13 NOTE — Telephone Encounter (Signed)
What needs to be in the narrative?  Thank you

## 2021-11-15 DIAGNOSIS — I69354 Hemiplegia and hemiparesis following cerebral infarction affecting left non-dominant side: Secondary | ICD-10-CM | POA: Diagnosis not present

## 2021-11-15 DIAGNOSIS — Z7982 Long term (current) use of aspirin: Secondary | ICD-10-CM | POA: Diagnosis not present

## 2021-11-15 DIAGNOSIS — R627 Adult failure to thrive: Secondary | ICD-10-CM | POA: Diagnosis not present

## 2021-11-15 DIAGNOSIS — Z9181 History of falling: Secondary | ICD-10-CM | POA: Diagnosis not present

## 2021-11-15 DIAGNOSIS — M21372 Foot drop, left foot: Secondary | ICD-10-CM | POA: Diagnosis not present

## 2021-11-15 DIAGNOSIS — R32 Unspecified urinary incontinence: Secondary | ICD-10-CM | POA: Diagnosis not present

## 2021-11-15 DIAGNOSIS — N401 Enlarged prostate with lower urinary tract symptoms: Secondary | ICD-10-CM | POA: Diagnosis not present

## 2021-11-15 DIAGNOSIS — I4891 Unspecified atrial fibrillation: Secondary | ICD-10-CM | POA: Diagnosis not present

## 2021-11-15 DIAGNOSIS — C8510 Unspecified B-cell lymphoma, unspecified site: Secondary | ICD-10-CM | POA: Diagnosis not present

## 2021-11-15 LAB — MULTIPLE MYELOMA PANEL, SERUM
Albumin SerPl Elph-Mcnc: 3.5 g/dL (ref 2.9–4.4)
Albumin/Glob SerPl: 1.3 (ref 0.7–1.7)
Alpha 1: 0.2 g/dL (ref 0.0–0.4)
Alpha2 Glob SerPl Elph-Mcnc: 1 g/dL (ref 0.4–1.0)
B-Globulin SerPl Elph-Mcnc: 0.9 g/dL (ref 0.7–1.3)
Gamma Glob SerPl Elph-Mcnc: 0.8 g/dL (ref 0.4–1.8)
Globulin, Total: 2.9 g/dL (ref 2.2–3.9)
IgA: 50 mg/dL — ABNORMAL LOW (ref 61–437)
IgG (Immunoglobin G), Serum: 878 mg/dL (ref 603–1613)
IgM (Immunoglobulin M), Srm: 7 mg/dL — ABNORMAL LOW (ref 15–143)
M Protein SerPl Elph-Mcnc: 0.6 g/dL — ABNORMAL HIGH
Total Protein ELP: 6.4 g/dL (ref 6.0–8.5)

## 2021-11-15 NOTE — Assessment & Plan Note (Signed)
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

## 2021-11-15 NOTE — Telephone Encounter (Signed)
OK I'll add Thx

## 2021-11-15 NOTE — Telephone Encounter (Signed)
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

## 2021-11-19 ENCOUNTER — Telehealth: Payer: Self-pay | Admitting: Internal Medicine

## 2021-11-19 NOTE — Telephone Encounter (Signed)
Home Health is calling to report that patient fell yesterday at his home.  Patient fell off of the commode.  No apparent injuries.  Patient is just sore from the fall.

## 2021-11-22 DIAGNOSIS — M8588 Other specified disorders of bone density and structure, other site: Secondary | ICD-10-CM | POA: Diagnosis not present

## 2021-11-22 DIAGNOSIS — W1830XA Fall on same level, unspecified, initial encounter: Secondary | ICD-10-CM | POA: Diagnosis not present

## 2021-11-22 DIAGNOSIS — R0781 Pleurodynia: Secondary | ICD-10-CM | POA: Diagnosis not present

## 2021-11-22 DIAGNOSIS — S34109A Unspecified injury to unspecified level of lumbar spinal cord, initial encounter: Secondary | ICD-10-CM | POA: Diagnosis not present

## 2021-11-22 DIAGNOSIS — J439 Emphysema, unspecified: Secondary | ICD-10-CM | POA: Diagnosis not present

## 2021-11-22 DIAGNOSIS — R102 Pelvic and perineal pain: Secondary | ICD-10-CM | POA: Diagnosis not present

## 2021-11-22 DIAGNOSIS — M47816 Spondylosis without myelopathy or radiculopathy, lumbar region: Secondary | ICD-10-CM | POA: Diagnosis not present

## 2021-11-22 DIAGNOSIS — M4316 Spondylolisthesis, lumbar region: Secondary | ICD-10-CM | POA: Diagnosis not present

## 2021-11-22 DIAGNOSIS — M549 Dorsalgia, unspecified: Secondary | ICD-10-CM | POA: Diagnosis not present

## 2021-11-22 DIAGNOSIS — R0981 Nasal congestion: Secondary | ICD-10-CM | POA: Diagnosis not present

## 2021-11-22 DIAGNOSIS — R296 Repeated falls: Secondary | ICD-10-CM | POA: Diagnosis not present

## 2021-11-22 DIAGNOSIS — L89102 Pressure ulcer of unspecified part of back, stage 2: Secondary | ICD-10-CM | POA: Diagnosis not present

## 2021-11-22 NOTE — Telephone Encounter (Signed)
Noted.  I am sorry.  Thank you

## 2021-11-24 ENCOUNTER — Other Ambulatory Visit: Payer: Self-pay

## 2021-11-24 DIAGNOSIS — C9000 Multiple myeloma not having achieved remission: Secondary | ICD-10-CM

## 2021-11-24 MED ORDER — LENALIDOMIDE 5 MG PO CAPS: 5.0000 mg | ORAL_CAPSULE | Freq: Every day | ORAL | 0 refills | Status: DC

## 2021-11-29 ENCOUNTER — Telehealth: Payer: Self-pay

## 2021-11-29 NOTE — Telephone Encounter (Signed)
Home Health verbal orders  Agency Name: [facility]   Requesting Skilled Nursing/Social Work   Frequency: [phone] / 1W1: SS for Evaluation   Message: Edker was seen at ED for a fall, has two rib fractures from that fall and does have an upcoming appt with [place] on Wednesday    Please forward to [facility] pool or providers CMA

## 2021-11-30 ENCOUNTER — Inpatient Hospital Stay: Payer: Medicare HMO

## 2021-11-30 ENCOUNTER — Other Ambulatory Visit: Payer: Self-pay

## 2021-11-30 ENCOUNTER — Inpatient Hospital Stay (HOSPITAL_BASED_OUTPATIENT_CLINIC_OR_DEPARTMENT_OTHER): Payer: Medicare HMO | Admitting: Hematology and Oncology

## 2021-11-30 ENCOUNTER — Other Ambulatory Visit: Payer: Self-pay | Admitting: Internal Medicine

## 2021-11-30 ENCOUNTER — Encounter: Payer: Self-pay | Admitting: Hematology and Oncology

## 2021-11-30 VITALS — BP 107/66 | HR 95 | Temp 97.9°F | Resp 18

## 2021-11-30 DIAGNOSIS — C9000 Multiple myeloma not having achieved remission: Secondary | ICD-10-CM | POA: Diagnosis not present

## 2021-11-30 DIAGNOSIS — D61818 Other pancytopenia: Secondary | ICD-10-CM | POA: Diagnosis not present

## 2021-11-30 DIAGNOSIS — C8510 Unspecified B-cell lymphoma, unspecified site: Secondary | ICD-10-CM

## 2021-11-30 DIAGNOSIS — Z7189 Other specified counseling: Secondary | ICD-10-CM | POA: Diagnosis not present

## 2021-11-30 DIAGNOSIS — L089 Local infection of the skin and subcutaneous tissue, unspecified: Secondary | ICD-10-CM | POA: Diagnosis not present

## 2021-11-30 DIAGNOSIS — B9689 Other specified bacterial agents as the cause of diseases classified elsewhere: Secondary | ICD-10-CM

## 2021-11-30 DIAGNOSIS — Z79899 Other long term (current) drug therapy: Secondary | ICD-10-CM | POA: Diagnosis not present

## 2021-11-30 DIAGNOSIS — R21 Rash and other nonspecific skin eruption: Secondary | ICD-10-CM | POA: Diagnosis not present

## 2021-11-30 LAB — CBC WITH DIFFERENTIAL/PLATELET
Abs Immature Granulocytes: 0.02 10*3/uL (ref 0.00–0.07)
Basophils Absolute: 0 10*3/uL (ref 0.0–0.1)
Basophils Relative: 1 %
Eosinophils Absolute: 0.7 10*3/uL — ABNORMAL HIGH (ref 0.0–0.5)
Eosinophils Relative: 20 %
HCT: 29.9 % — ABNORMAL LOW (ref 39.0–52.0)
Hemoglobin: 10.3 g/dL — ABNORMAL LOW (ref 13.0–17.0)
Immature Granulocytes: 1 %
Lymphocytes Relative: 32 %
Lymphs Abs: 1.2 10*3/uL (ref 0.7–4.0)
MCH: 39.5 pg — ABNORMAL HIGH (ref 26.0–34.0)
MCHC: 34.4 g/dL (ref 30.0–36.0)
MCV: 114.6 fL — ABNORMAL HIGH (ref 80.0–100.0)
Monocytes Absolute: 0.5 10*3/uL (ref 0.1–1.0)
Monocytes Relative: 13 %
Neutro Abs: 1.2 10*3/uL — ABNORMAL LOW (ref 1.7–7.7)
Neutrophils Relative %: 33 %
Platelets: 102 10*3/uL — ABNORMAL LOW (ref 150–400)
RBC: 2.61 MIL/uL — ABNORMAL LOW (ref 4.22–5.81)
RDW: 15.2 % (ref 11.5–15.5)
WBC: 3.7 10*3/uL — ABNORMAL LOW (ref 4.0–10.5)
nRBC: 0 % (ref 0.0–0.2)

## 2021-11-30 LAB — COMPREHENSIVE METABOLIC PANEL
ALT: 15 U/L (ref 0–44)
AST: 15 U/L (ref 15–41)
Albumin: 3.8 g/dL (ref 3.5–5.0)
Alkaline Phosphatase: 102 U/L (ref 38–126)
Anion gap: 5 (ref 5–15)
BUN: 18 mg/dL (ref 8–23)
CO2: 27 mmol/L (ref 22–32)
Calcium: 9.5 mg/dL (ref 8.9–10.3)
Chloride: 103 mmol/L (ref 98–111)
Creatinine, Ser: 0.82 mg/dL (ref 0.61–1.24)
GFR, Estimated: >60 mL/min (ref >60)
Glucose, Bld: 128 mg/dL — ABNORMAL HIGH (ref 70–99)
Potassium: 3.8 mmol/L (ref 3.5–5.1)
Sodium: 135 mmol/L (ref 135–145)
Total Bilirubin: 0.5 mg/dL (ref 0.3–1.2)
Total Protein: 6.8 g/dL (ref 6.5–8.1)

## 2021-11-30 MED ORDER — SULFAMETHOXAZOLE-TRIMETHOPRIM 800-160 MG PO TABS: 1.0000 | ORAL_TABLET | Freq: Two times a day (BID) | ORAL | 0 refills | Status: AC

## 2021-11-30 MED ORDER — SODIUM CHLORIDE 0.9% FLUSH
10.0000 mL | Freq: Once | INTRAVENOUS | Status: AC
  Administered 2021-11-30: 10 mL

## 2021-11-30 MED ORDER — HEPARIN SOD (PORK) LOCK FLUSH 100 UNIT/ML IV SOLN
500.0000 [IU] | Freq: Once | INTRAVENOUS | Status: AC
  Administered 2021-11-30: 500 [IU]

## 2021-11-30 NOTE — Assessment & Plan Note (Signed)
Repeat serum protein electrophoresis is stable/improved However, his performance status continues to decline due to other comorbidities After much discussion with his wife, we are in agreement to discontinue all his treatment

## 2021-11-30 NOTE — Progress Notes (Signed)
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[facility]) I believe the chemotherapy and the maintenance lenalidomide has been very effective to control his disease However, due to progressive decline in performance status, I recommend discontinuation of treatment  Multiple myeloma not having achieved remission ([place]) Repeat serum protein electrophoresis is stable/improved However, his performance status continues to decline due to other comorbidities After much discussion with his wife, we are in agreement to discontinue all his treatment  Pancytopenia ([place]) His pancytopenia continues to improve since his last treatment He does not need transfusion support  Bacterial skin infection He has signs of bacterial infection with mild cellulitis on the left shoulder Due to multiple surgical procedures with recent skin cancer, I recommend covering him with Bactrim for potential MRSA  Goals of care, counseling/discussion I revisited goals of care discussion with his wife The patient is not able to participate fully with goals of care discussion due to his excessive sedation, history of stroke and dementia His wife appears a little burnout taking care of him I do not recommend him to continue on chemotherapy as the patient has no quality of life I do not recommend getting his port removed due to potential complications He has appointment to see his primary care doctor tomorrow and I recommend enrollment in home-based palliative care with hospice through his primary care doctor  No orders of the defined types were placed in this encounter.   All questions were answered. The patient knows to call the clinic with any problems, questions or concerns. The total time spent in the appointment was 40  minutes encounter with patients including review of chart and various tests results, discussions about plan of care and coordination of care plan   Henry Lark, MD 11/30/2021 11:12 AM  INTERVAL HISTORY: Please see below for problem oriented charting. he returns for treatment follow-up on maintenance Revlimid for multiple myeloma and diffuse large B-cell lymphoma His wife provided most of the history He is quite debilitated and falls asleep quickly and barely able to be alert He had recent fall over the left shoulder His wife noted worsening skin rash over the left shoulder He also have intermittent pain when he is alert He falls asleep easily and sleep more than 12 hours a day every day She has not noticed any recent fever or chills  REVIEW OF SYSTEMS:   All other systems were reviewed with the patient and are negative.  I have reviewed the past medical history, past surgical history, social history and family history with the patient and they are unchanged from previous note.  ALLERGIES:  is allergic to aricept [donepezil] and namenda [memantine].  MEDICATIONS:  Current Outpatient Medications  Medication Sig Dispense Refill   sulfamethoxazole-trimethoprim (BACTRIM DS) 800-160 MG tablet Take 1 tablet by mouth 2 (two) times daily. 20 tablet 0   acetaminophen (TYLENOL) 325 MG tablet Take 650 mg by mouth every 6 (six) hours as needed for moderate pain.     acyclovir (ZOVIRAX) 400 MG tablet Take 1 tablet (400 mg total) by mouth daily. 30 tablet 6   aspirin EC 81 MG tablet Take 81 mg by mouth daily. Swallow whole.     atorvastatin (LIPITOR) 40 MG tablet TAKE ONE (1) TABLET BY MOUTH EVERY DAY AT 6PM. (Patient taking differently: Take 20 mg by  mouth every other day.) 30 tablet 5   B Complex-Folic Acid (B COMPLEX PLUS) TABS Take 1 tablet by mouth daily. 100 tablet 3   Cholecalciferol (VITAMIN D3) 50 MCG (2000 UT) capsule Take 1 capsule (2,000 Units total) by mouth daily. 100 capsule 3    megestrol (MEGACE) 40 MG tablet Take 1 tablet (40 mg total) by mouth 2 (two) times daily. 60 tablet 5   Misc Natural Products (PROSTATE HEALTH) CAPS Take 1 capsule by mouth in the morning and at bedtime.     polyethylene glycol (MIRALAX) 17 g packet Take 17 g by mouth daily. 30 each 1   tamsulosin (FLOMAX) 0.4 MG CAPS capsule Take 1 capsule (0.4 mg total) by mouth daily. 30 capsule 6   No current facility-administered medications for this visit.    SUMMARY OF ONCOLOGIC HISTORY: Oncology History Overview Note  Normal cytogenetics, BCL-2 rearrangement by FISH is positive, neg for BCL-6 or cMYC  Myeloma FISH panel showed gain chromosome 11 and deletion 1p   High grade B-cell lymphoma ([place])  06/12/2021 Imaging   1. No evidence for primary malignancy or metastatic disease in the chest, abdomen, or pelvis. 2. Subtle edema/inflammation in the sigmoid mesocolon in the region of an ill-defined diverticulum. Imaging features consistent with sigmoid diverticulitis. No perforation or abscess. 3. Prostatomegaly with evidence of median lobe hypertrophy generating mass-effect on the inferior bladder. 4. Nonacute left-sided rib fractures. 5. 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 6. Aortic Atherosclerosis (ICD10-I70.0) and Emphysema (ICD10-J43.9).   06/14/2021 Bone Marrow Biopsy   BONE MARROW, ASPIRATE, CLOT, CORE:  -Hypercellular bone marrow with extensive involvement by a B-cell lymphoproliferative disorder  -Monoclonal plasma cell population identified  -See comment   PERIPHERAL BLOOD:  -Pancytopenia   COMMENT:   The bone marrow shows extensive involvement by a high-grade B-cell lymphoproliferative disorder displaying dim surface lambda light chain expression and bright CD45 consistent with involvement by high-grade B-cell lymphoma.  In addition, there is an admixed monoclonal plasma cell population representing 20% of all cells in the aspirate and displaying kappa  light chain restriction consistent with a neoplastic process.  Since the lymphoid and the plasma cell components have different light chain expression, they appear unrelated and may represent two distinct disease processes.  Nonetheless, correlation with cytogenetic and FISH studies is recommended   06/18/2021 Initial Diagnosis   High grade B-cell lymphoma ([place])   06/18/2021 Cancer Staging   Staging form: Hodgkin and Non-Hodgkin Lymphoma, AJCC 8th Edition - Clinical stage from 06/18/2021: Stage IV (Unknown) - Signed by Henry Lark, MD on 06/18/2021 Stage prefix: Initial diagnosis   06/24/2021 Procedure   Placement of a subcutaneous power-injectable port device. Catheter tip at the superior cavoatrial junction.   07/01/2021 Echocardiogram   1. Hypokinesis of the basal inferolateral wall with overall low normal LV function.  2. Left ventricular ejection fraction, by estimation, is 50 to 55%. The left ventricle has low normal function. The left ventricle demonstrates regional wall motion abnormalities (see scoring diagram/findings for description). There is mild left ventricular hypertrophy of the basal-septal segment. Left ventricular diastolic parameters are consistent with Grade I diastolic dysfunction (impaired relaxation). Elevated left atrial pressure.  3. Right ventricular systolic function is normal. The right ventricular size is normal.  4. The mitral valve is normal in structure. Mild mitral valve regurgitation. No evidence of mitral stenosis.  5. The aortic valve is tricuspid. Aortic valve regurgitation is trivial. Aortic valve sclerosis is present, with no evidence of aortic valve  stenosis.  6. Aortic dilatation noted. There is mild dilatation of the aortic root, measuring 41 mm.     07/02/2021 - 09/03/2021 Chemotherapy   Patient is on Treatment Plan : NON-HODGKINS LYMPHOMA R-CHOP q21d     Multiple myeloma not having achieved remission ([place])  06/18/2021 Initial Diagnosis   Multiple myeloma  not having achieved remission ([place])   06/18/2021 Cancer Staging   Staging form: Plasma Cell Myeloma and Plasma Cell Disorders, AJCC 8th Edition - Clinical stage from 06/18/2021: Albumin (g/dL): 3.1, ISS: Stage II, High-risk cytogenetics: Unknown, LDH: Elevated - Signed by Henry Lark, MD on 06/18/2021 Stage prefix: Initial diagnosis Albumin range (g/dL): Less than 3.5 Cytogenetics: Unknown     PHYSICAL EXAMINATION: ECOG PERFORMANCE STATUS: 3 - Symptomatic, >50% confined to bed  Vitals:   11/30/21 1012  BP: 107/66  Pulse: 95  Resp: 18  Temp: 97.9 F (36.6 C)  SpO2: 99%   There were no vitals filed for this visit.  GENERAL:alert, no distress and comfortable SKIN: Noticed signs of skin infection and cellulitis on the left shoulder NEURO: He is falling asleep intermittently and marginally alert  LABORATORY DATA:  I have reviewed the data as listed    Component Value Date/Time   NA 135 11/30/2021 0943   K 3.8 11/30/2021 0943   CL 103 11/30/2021 0943   CO2 27 11/30/2021 0943   GLUCOSE 128 (H) 11/30/2021 0943   BUN 18 11/30/2021 0943   CREATININE 0.82 11/30/2021 0943   CREATININE 0.82 10/04/2021 0854   CALCIUM 9.5 11/30/2021 0943   PROT 6.8 11/30/2021 0943   ALBUMIN 3.8 11/30/2021 0943   AST 15 11/30/2021 0943   AST 21 10/04/2021 0854   ALT 15 11/30/2021 0943   ALT 17 10/04/2021 0854   ALKPHOS 102 11/30/2021 0943   BILITOT 0.5 11/30/2021 0943   BILITOT 0.4 10/04/2021 0854   GFRNONAA >60 11/30/2021 0943   GFRNONAA >60 10/04/2021 0854   GFRAA >60 04/25/2019 0528    No results found for: "SPEP", "UPEP"  Lab Results  Component Value Date   WBC 3.7 (L) 11/30/2021   NEUTROABS 1.2 (L) 11/30/2021   HGB 10.3 (L) 11/30/2021   HCT 29.9 (L) 11/30/2021   MCV 114.6 (H) 11/30/2021   PLT 102 (L) 11/30/2021      Chemistry      Component Value Date/Time   NA 135 11/30/2021 0943   K 3.8 11/30/2021 0943   CL 103 11/30/2021 0943   CO2 27 11/30/2021 0943   BUN 18 11/30/2021  0943   CREATININE 0.82 11/30/2021 0943   CREATININE 0.82 10/04/2021 0854      Component Value Date/Time   CALCIUM 9.5 11/30/2021 0943   ALKPHOS 102 11/30/2021 0943   AST 15 11/30/2021 0943   AST 21 10/04/2021 0854   ALT 15 11/30/2021 0943   ALT 17 10/04/2021 0854   BILITOT 0.5 11/30/2021 0943   BILITOT 0.4 10/04/2021 0854

## 2021-11-30 NOTE — Assessment & Plan Note (Signed)
I revisited goals of care discussion with his wife The patient is not able to participate fully with goals of care discussion due to his excessive sedation, history of stroke and dementia His wife appears a little burnout taking care of him I do not recommend him to continue on chemotherapy as the patient has no quality of life I do not recommend getting his port removed due to potential complications He has appointment to see his primary care doctor tomorrow and I recommend enrollment in home-based palliative care with hospice through his primary care doctor

## 2021-11-30 NOTE — Assessment & Plan Note (Signed)
He has signs of bacterial infection with mild cellulitis on the left shoulder Due to multiple surgical procedures with recent skin cancer, I recommend covering him with Bactrim for potential MRSA

## 2021-11-30 NOTE — Telephone Encounter (Signed)
original prescription was discontinued on 10/27/2021 by Earnstine Regal, RMA . Is this ok to be refill.Marland KitchenChryl Heck

## 2021-11-30 NOTE — Assessment & Plan Note (Signed)
His pancytopenia continues to improve since his last treatment He does not need transfusion support

## 2021-11-30 NOTE — Assessment & Plan Note (Signed)
I believe the chemotherapy and the maintenance lenalidomide has been very effective to control his disease However, due to progressive decline in performance status, I recommend discontinuation of treatment

## 2021-12-01 ENCOUNTER — Encounter: Payer: Self-pay | Admitting: Internal Medicine

## 2021-12-01 ENCOUNTER — Ambulatory Visit (INDEPENDENT_AMBULATORY_CARE_PROVIDER_SITE_OTHER): Payer: Medicare HMO | Admitting: Internal Medicine

## 2021-12-01 DIAGNOSIS — R413 Other amnesia: Secondary | ICD-10-CM

## 2021-12-01 DIAGNOSIS — F03918 Unspecified dementia, unspecified severity, with other behavioral disturbance: Secondary | ICD-10-CM | POA: Diagnosis not present

## 2021-12-01 DIAGNOSIS — R627 Adult failure to thrive: Secondary | ICD-10-CM

## 2021-12-01 DIAGNOSIS — R296 Repeated falls: Secondary | ICD-10-CM

## 2021-12-01 DIAGNOSIS — R21 Rash and other nonspecific skin eruption: Secondary | ICD-10-CM | POA: Diagnosis not present

## 2021-12-01 DIAGNOSIS — C8510 Unspecified B-cell lymphoma, unspecified site: Secondary | ICD-10-CM

## 2021-12-01 DIAGNOSIS — Z66 Do not resuscitate: Secondary | ICD-10-CM

## 2021-12-01 MED ORDER — METHYLPREDNISOLONE ACETATE 80 MG/ML IJ SUSP
80.0000 mg | Freq: Once | INTRAMUSCULAR | Status: AC
  Administered 2021-12-01: 80 mg via INTRAMUSCULAR

## 2021-12-01 MED ORDER — MEMANTINE HCL 5 MG PO TABS: 5.0000 mg | ORAL_TABLET | Freq: Two times a day (BID) | ORAL | 11 refills | Status: AC

## 2021-12-01 MED ORDER — TRIAMCINOLONE ACETONIDE 0.1 % EX CREA: 1.0000 | TOPICAL_CREAM | Freq: Four times a day (QID) | CUTANEOUS | 3 refills | Status: AC

## 2021-12-01 MED ORDER — OXYBUTYNIN CHLORIDE 5 MG PO TABS: 5.0000 mg | ORAL_TABLET | Freq: Three times a day (TID) | ORAL | 3 refills | Status: AC | PRN

## 2021-12-01 NOTE — Patient Instructions (Addendum)
Start Prednisone 10 mg: take 4 tabs a day x 3 days; then 3 tabs a day x 4 days; then 2 tabs a day x 4 days, then 1 tab a day x 6 days, then stop. Take  with food.  Get a rib belt

## 2021-12-01 NOTE — Assessment & Plan Note (Signed)
Worse Hospice Ref

## 2021-12-01 NOTE — Assessment & Plan Note (Signed)
Worse.  Chemotherapy was stopped.  Per Dr Bertis Ruddy:  "I revisited goals of care discussion with his wife The patient is not able to participate fully with goals of care discussion due to his excessive sedation, history of stroke and dementia His wife appears a little burnout taking care of him I do not recommend him to continue on chemotherapy as the patient has no quality of life I do not recommend getting his port removed due to potential complications He has appointment to see his primary care doctor tomorrow and I recommend enrollment in home-based palliative care with hospice through his primary care doctor".   We discussed the situation with Gerlene Burdock and his wife.  I will arrange for a Hospice consultation.

## 2021-12-01 NOTE — Telephone Encounter (Signed)
Noted! Thank you

## 2021-12-01 NOTE — Assessment & Plan Note (Signed)
They want to go back on Namenda

## 2021-12-01 NOTE — Progress Notes (Addendum)
Pls cosign for DEPO inj../lmb  Medical screening examination/treatment/procedure(s) were performed by non-physician practitioner and as supervising physician I was immediately available for consultation/collaboration.  I agree with above. Lew Dawes, MD

## 2021-12-01 NOTE — Assessment & Plan Note (Signed)
D/c chemo Dep-omedrol IM 80 mg Triamc cream Prednisone 10 mg: take 4 tabs a day x 3 days; then 3 tabs a day x 4 days; then 2 tabs a day x 4 days, then 1 tab a day x 6 days, then stop. Take pc.

## 2021-12-01 NOTE — Progress Notes (Unsigned)
Subjective:  Patient ID: Henry [name], male    DOB: 1940/03/28  Age: 81 y.o. MRN: [id number]  CC: Follow-up (Wife states he had to go to urgent care. He has a rib fractures in 7th and 8th rib)   HPI DURAND WITTMEYER presents for FTT, falls, rib fx C/o rash, bad OAB sx's, memory loss.  He is here with his wife.  She is providing the history.  Per Dr Alvy Bimler:  "I revisited goals of care discussion with his wife The patient is not able to participate fully with goals of care discussion due to his excessive sedation, history of stroke and dementia His wife appears a little burnout taking care of him I do not recommend him to continue on chemotherapy as the patient has no quality of life I do not recommend getting his port removed due to potential complications He has appointment to see his primary care doctor tomorrow and I recommend enrollment in home-based palliative care with hospice through his primary care doctor".    Outpatient Medications Prior to Visit  Medication Sig Dispense Refill   acetaminophen (TYLENOL) 325 MG tablet Take 650 mg by mouth every 6 (six) hours as needed for moderate pain.     acyclovir (ZOVIRAX) 400 MG tablet Take 1 tablet (400 mg total) by mouth daily. 30 tablet 6   aspirin EC 81 MG tablet Take 81 mg by mouth daily. Swallow whole.     atorvastatin (LIPITOR) 40 MG tablet TAKE ONE (1) TABLET BY MOUTH EVERY DAY AT 6PM. (Patient taking differently: Take 20 mg by mouth every other day.) 30 tablet 5   B Complex-Folic Acid (B COMPLEX PLUS) TABS Take 1 tablet by mouth daily. 100 tablet 3   Cholecalciferol (VITAMIN D3) 50 MCG (2000 UT) capsule Take 1 capsule (2,000 Units total) by mouth daily. 100 capsule 3   megestrol (MEGACE) 40 MG tablet Take 1 tablet (40 mg total) by mouth 2 (two) times daily. 60 tablet 5   Misc Natural Products (PROSTATE HEALTH) CAPS Take 1 capsule by mouth in the morning and at bedtime.     polyethylene glycol (MIRALAX) 17 g packet Take 17 g by  mouth daily. 30 each 1   sulfamethoxazole-trimethoprim (BACTRIM DS) 800-160 MG tablet Take 1 tablet by mouth 2 (two) times daily. 20 tablet 0   tamsulosin (FLOMAX) 0.4 MG CAPS capsule Take 1 capsule (0.4 mg total) by mouth daily. 30 capsule 6   No facility-administered medications prior to visit.    ROS: Review of Systems  Constitutional:  Positive for fatigue and unexpected weight change. Negative for appetite change.  HENT:  Negative for congestion, nosebleeds, sneezing, sore throat and trouble swallowing.   Eyes:  Negative for itching and visual disturbance.  Respiratory:  Negative for cough.   Cardiovascular:  Positive for chest pain. Negative for palpitations.  Gastrointestinal:  Positive for constipation. Negative for abdominal distention, blood in stool, diarrhea and nausea.  Genitourinary:  Negative for frequency and hematuria.  Musculoskeletal:  Positive for arthralgias, back pain and gait problem. Negative for joint swelling and neck pain.  Skin:  Positive for rash.  Neurological:  Positive for dizziness, speech difficulty and weakness. Negative for tremors.  Hematological:  Bruises/bleeds easily.  Psychiatric/Behavioral:  Positive for agitation, behavioral problems, confusion, decreased concentration and sleep disturbance. Negative for dysphoric mood and suicidal ideas. The patient is nervous/anxious.     Objective:  BP 110/72 (BP Location: Left Arm)   Pulse (!) 107   Temp 98.8  F (37.1 C) (Oral)   Ht '5\' 9"'$  (1.753 m)   Wt 160 lb 12.8 oz (72.9 kg)   SpO2 96%   BMI 23.75 kg/m   BP Readings from Last 3 Encounters:  12/01/21 110/72  11/30/21 107/66  11/09/21 121/74    Wt Readings from Last 3 Encounters:  12/01/21 160 lb 12.8 oz (72.9 kg)  11/09/21 149 lb 4.8 oz (67.7 kg)  11/01/21 145 lb 9.6 oz (66 kg)    Physical Exam Constitutional:      General: He is not in acute distress.    Appearance: He is well-developed. He is ill-appearing. He is not toxic-appearing.      Comments: NAD  Eyes:     Conjunctiva/sclera: Conjunctivae normal.     Pupils: Pupils are equal, round, and reactive to light.  Neck:     Thyroid: No thyromegaly.     Vascular: No JVD.  Cardiovascular:     Rate and Rhythm: Normal rate and regular rhythm.     Heart sounds: Normal heart sounds. No murmur heard.    No friction rub. No gallop.  Pulmonary:     Effort: Pulmonary effort is normal. No respiratory distress.     Breath sounds: Normal breath sounds. No wheezing or rales.  Chest:     Chest wall: No tenderness.  Abdominal:     General: Bowel sounds are normal. There is no distension.     Palpations: Abdomen is soft. There is no mass.     Tenderness: There is no abdominal tenderness. There is no guarding or rebound.  Musculoskeletal:        General: No tenderness. Normal range of motion.     Cervical back: Normal range of motion.     Right lower leg: Edema present.     Left lower leg: Edema present.  Lymphadenopathy:     Cervical: No cervical adenopathy.  Skin:    General: Skin is warm and dry.     Findings: Erythema and rash present.  Neurological:     Mental Status: He is alert. Mental status is at baseline.     Cranial Nerves: No cranial nerve deficit.     Motor: Weakness present. No abnormal muscle tone.     Coordination: Coordination abnormal.     Gait: Gait abnormal.     Deep Tendon Reflexes: Reflexes are normal and symmetric.   Somnolent.  He is sitting slumped in a wheelchair.  Rash on chest and abdomen LS bruised in the lower thoracic and upper lumbar spine area  Lab Results  Component Value Date   WBC 3.7 (L) 11/30/2021   HGB 10.3 (L) 11/30/2021   HCT 29.9 (L) 11/30/2021   PLT 102 (L) 11/30/2021   GLUCOSE 128 (H) 11/30/2021   CHOL 147 08/26/2019   TRIG 180.0 (H) 08/26/2019   HDL 41.80 08/26/2019   LDLDIRECT 135.0 02/24/2017   LDLCALC 70 08/26/2019   ALT 15 11/30/2021   AST 15 11/30/2021   NA 135 11/30/2021   K 3.8 11/30/2021   CL 103  11/30/2021   CREATININE 0.82 11/30/2021   BUN 18 11/30/2021   CO2 27 11/30/2021   TSH 1.384 06/12/2021   PSA 1.75 02/24/2017   INR 1.2 04/17/2019   HGBA1C 6.6 (H) 05/29/2019    ECHOCARDIOGRAM COMPLETE  Result Date: 06/30/2021    ECHOCARDIOGRAM REPORT   Patient Name:   SALAR MOLDEN Date of Exam: 06/30/2021 Medical Rec #:  [id number]       Height:  69.0 in Accession #:    [id number]      Weight:       139.0 lb Date of Birth:  09/19/1940       BSA:          1.770 m Patient Age:    16 years        BP:           114/68 mmHg Patient Gender: M               HR:           83 bpm. Exam Location:  Outpatient Procedure: 2D Echo, Cardiac Doppler, Color Doppler and Strain Analysis Indications:    Chemo  History:        Patient has prior history of Echocardiogram examinations, most                 recent 04/17/2019. Stroke.  Sonographer:    [place] Referring Phys: [id number] NI [place]  1. Hypokinesis of the basal inferolateral wall with overall low normal LV function.  2. Left ventricular ejection fraction, by estimation, is 50 to 55%. The left ventricle has low normal function. The left ventricle demonstrates regional wall motion abnormalities (see scoring diagram/findings for description). There is mild left ventricular hypertrophy of the basal-septal segment. Left ventricular diastolic parameters are consistent with Grade I diastolic dysfunction (impaired relaxation). Elevated left atrial pressure.  3. Right ventricular systolic function is normal. The right ventricular size is normal.  4. The mitral valve is normal in structure. Mild mitral valve regurgitation. No evidence of mitral stenosis.  5. The aortic valve is tricuspid. Aortic valve regurgitation is trivial. Aortic valve sclerosis is present, with no evidence of aortic valve stenosis.  6. Aortic dilatation noted. There is mild dilatation of the aortic root, measuring 41 mm. FINDINGS  Left Ventricle: Left ventricular ejection  fraction, by estimation, is 50 to 55%. The left ventricle has low normal function. The left ventricle demonstrates regional wall motion abnormalities. The left ventricular internal cavity size was normal in size. There is mild left ventricular hypertrophy of the basal-septal segment. Abnormal (paradoxical) septal motion, consistent with left bundle branch block. Left ventricular diastolic parameters are consistent with Grade I diastolic dysfunction (impaired relaxation). Elevated left atrial pressure. Right Ventricle: The right ventricular size is normal. Right ventricular systolic function is normal. Left Atrium: Left atrial size was normal in size. Right Atrium: Right atrial size was normal in size. Pericardium: Trivial pericardial effusion is present. Mitral Valve: The mitral valve is normal in structure. Mild mitral valve regurgitation. No evidence of mitral valve stenosis. Tricuspid Valve: The tricuspid valve is normal in structure. Tricuspid valve regurgitation is trivial. No evidence of tricuspid stenosis. Aortic Valve: The aortic valve is tricuspid. Aortic valve regurgitation is trivial. Aortic valve sclerosis is present, with no evidence of aortic valve stenosis. Aortic valve mean gradient measures 5.0 mmHg. Aortic valve peak gradient measures 8.3 mmHg. Aortic valve area, by VTI measures 2.40 cm. Pulmonic Valve: The pulmonic valve was normal in structure. Pulmonic valve regurgitation is not visualized. No evidence of pulmonic stenosis. Aorta: Aortic dilatation noted. There is mild dilatation of the aortic root, measuring 41 mm. Venous: The inferior vena cava was not well visualized. IAS/Shunts: The interatrial septum was not well visualized. Additional Comments: Hypokinesis of the basal inferolateral wall with overall low normal LV function.  LEFT VENTRICLE PLAX 2D LVIDd:         3.90 cm  Diastology LVIDs:         3.40 cm     LV e' medial:    3.59 cm/s LV PW:         1.10 cm     LV E/e' medial:  17.7 LV  IVS:        1.30 cm     LV e' lateral:   7.18 cm/s LVOT diam:     2.10 cm     LV E/e' lateral: 8.8 LV SV:         55 LV SV Index:   31 LVOT Area:     3.46 cm  LV Volumes (MOD) LV vol d, MOD A2C: 56.4 ml LV vol d, MOD A4C: 61.9 ml LV vol s, MOD A2C: 29.5 ml LV vol s, MOD A4C: 30.9 ml LV SV MOD A2C:     26.9 ml LV SV MOD A4C:     61.9 ml LV SV MOD BP:      31.5 ml RIGHT VENTRICLE RV Basal diam:  2.90 cm RV Mid diam:    2.60 cm RV S prime:     10.80 cm/s TAPSE (M-mode): 1.2 cm LEFT ATRIUM           Index LA Vol (A4C): 32.3 ml 18.25 ml/m  AORTIC VALVE                    PULMONIC VALVE AV Area (Vmax):    2.30 cm     PV Vmax:       0.72 m/s AV Area (Vmean):   2.14 cm     PV Vmean:      48.600 cm/s AV Area (VTI):     2.40 cm     PV VTI:        0.122 m AV Vmax:           144.00 cm/s  PV Peak grad:  2.1 mmHg AV Vmean:          99.900 cm/s  PV Mean grad:  1.0 mmHg AV VTI:            0.231 m AV Peak Grad:      8.3 mmHg AV Mean Grad:      5.0 mmHg LVOT Vmax:         95.70 cm/s LVOT Vmean:        61.800 cm/s LVOT VTI:          0.160 m LVOT/AV VTI ratio: 0.69  AORTA Ao Root diam: 3.10 cm Ao Asc diam:  3.30 cm MITRAL VALVE                TRICUSPID VALVE MV Area (PHT): 3.65 cm     TR Peak grad:   19.7 mmHg MV Decel Time: 208 msec     TR Vmax:        222.00 cm/s MV E velocity: 63.40 cm/s MV A velocity: 102.00 cm/s  SHUNTS MV E/A ratio:  0.62         Systemic VTI:  0.16 m                             Systemic Diam: 2.10 cm Kirk Ruths MD Electronically signed by Kirk Ruths MD Signature Date/Time: 06/30/2021/1:19:02 PM    Final     Assessment & Plan:   Problem List Items Addressed This Visit     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  They will stop if problems.      Relevant Medications   memantine (NAMENDA) 5 MG tablet   Do not intubate, perform CPR, or defibrillate     We discussed the situation with Delfino Lovett and his wife  -confirmed no CPR.       Frequent falls    Multifactorial. Fall prevention was discussed again I think hospice referral will be helpful in fall prevention.      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  Chemotherapy was stopped.  Per Dr Alvy Bimler:  "I revisited goals of care discussion with his wife The patient is not able to participate fully with goals of care discussion due to his excessive sedation, history of stroke and dementia His wife appears a little burnout taking care of him I do not recommend him to continue on chemotherapy as the patient has no quality of life I do not recommend getting his port removed due to potential complications He has appointment to see his primary care doctor tomorrow and I recommend enrollment in home-based palliative care with hospice through his primary care doctor".   We discussed the situation with Delfino Lovett and his wife.  I will arrange for a Hospice consultation.       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  Chemotherapy was stopped.  Per Dr Alvy Bimler:  "I revisited goals of care discussion with his wife The patient is not able to participate fully with goals of care discussion due to his excessive sedation, history of stroke and dementia His wife appears a little burnout taking care of him I do not recommend him to continue on chemotherapy as the patient has no quality of life I do not recommend getting his port removed due to potential complications He has appointment to see his primary care doctor tomorrow and I recommend enrollment in home-based palliative care with hospice through his primary care doctor".   We discussed the situation with Delfino Lovett and his wife.  I will arrange for a Hospice consultation.      Relevant Orders   Ambulatory referral to Hospice   Memory loss    They want to go back on Namenda in spite of previous history of side effects.  We will try  again.      Rash    D/c chemo Dep-omedrol IM 80 mg Triamc cream Prednisone 10 mg: take 4 tabs a day x 3 days; then 3 tabs a day x 4 days; then 2 tabs a day x 4 days, then 1 tab a day x 6 days, then stop. Take pc.           Meds ordered this encounter  Medications   oxybutynin (DITROPAN) 5 MG tablet    Sig: Take 1 tablet (5 mg total) by mouth every 8 (eight) hours as needed for bladder spasms.    Dispense:  90 tablet    Refill:  3   triamcinolone cream (KENALOG) 0.1 %    Sig: Apply 1 Application topically 4 (four) times daily. On rash    Dispense:  160 g    Refill:  3   memantine (NAMENDA) 5 MG tablet    Sig: Take 1 tablet (5 mg total) by mouth 2 (two) times daily.    Dispense:  60 tablet    Refill:  11  methylPREDNISolone acetate (DEPO-MEDROL) injection 80 mg      Follow-up: Return in about 4 weeks (around 12/29/2021) for a follow-up visit.  Walker Kehr, MD

## 2021-12-02 DIAGNOSIS — R296 Repeated falls: Secondary | ICD-10-CM | POA: Insufficient documentation

## 2021-12-02 DIAGNOSIS — Z66 Do not resuscitate: Secondary | ICD-10-CM | POA: Insufficient documentation

## 2021-12-02 NOTE — Assessment & Plan Note (Signed)
Multifactorial. Fall prevention was discussed again I think hospice referral will be helpful in fall prevention.

## 2021-12-02 NOTE — Assessment & Plan Note (Signed)
  We discussed the situation with Henry [name] and his wife -confirmed no CPR.

## 2021-12-02 NOTE — Assessment & Plan Note (Signed)
We will try Namenda again in spite of previous history of side effects.  They will stop if problems.

## 2021-12-03 ENCOUNTER — Telehealth: Payer: Self-pay | Admitting: Internal Medicine

## 2021-12-03 NOTE — Telephone Encounter (Signed)
[place] Name: [facility] Agency Name: Santina Evans Phone #: [phone] Secure line   Service Requested: Social work (examples: OT/PT/Skilled Nursing/Social Work/Speech Therapy/Wound Care)  Frequency of Visits: Caregiver requested that patient be seen next week. Needs verbal to reschedule with patient. Eval visit and additional visit to follow up

## 2021-12-03 NOTE — Telephone Encounter (Signed)
Erline Levine from [facility] called for clarification on a referral (# [id number]) and wether the pt needs hospice or palliative care   Please call Marzetta Board to clarify:  Stacy: [phone]

## 2021-12-06 NOTE — Telephone Encounter (Signed)
Called Henry [name] back no answer LMOM ok for verbal../lmb

## 2021-12-07 NOTE — Telephone Encounter (Signed)
Hospice care please.  Thank you

## 2021-12-07 NOTE — Telephone Encounter (Signed)
Notified [facility] w/ MD response.Marland KitchenJohny [name]

## 2021-12-07 NOTE — Telephone Encounter (Signed)
Okay. Thank you.

## 2021-12-08 DIAGNOSIS — M21372 Foot drop, left foot: Secondary | ICD-10-CM | POA: Diagnosis not present

## 2021-12-13 ENCOUNTER — Ambulatory Visit: Payer: Medicare HMO | Admitting: Internal Medicine

## 2021-12-29 ENCOUNTER — Ambulatory Visit: Payer: Medicare HMO | Admitting: Internal Medicine

## 2022-01-05 DEATH — deceased

## 2022-01-07 ENCOUNTER — Telehealth: Payer: Self-pay | Admitting: *Deleted

## 2022-01-07 NOTE — Telephone Encounter (Addendum)
Notification of death Date of Admission : 17-Dec-2021 Date of Death : 01/08/22 @ 6:33 pm Diagnosis- Diffuse large B-cell lymphoma, unspecified   Noted.  Lew Dawes, MD

## 2022-01-12 NOTE — Telephone Encounter (Signed)
Noted.  No messages on DAVE Lanagan for myself.  Card sent.  Thank you

## 2022-01-12 NOTE — Telephone Encounter (Signed)
Pls sign of notification.Marland KitchenJohny [name]

## 2023-01-17 IMAGING — DX DG CHEST 1V PORT
1 series · 1 of 1 positions shown · non-contrast
Comparison: Chest CT 12/10/2019

CLINICAL DATA: Anemia and shortness of breath

EXAM:
PORTABLE CHEST 1 VIEW

[chest ap]
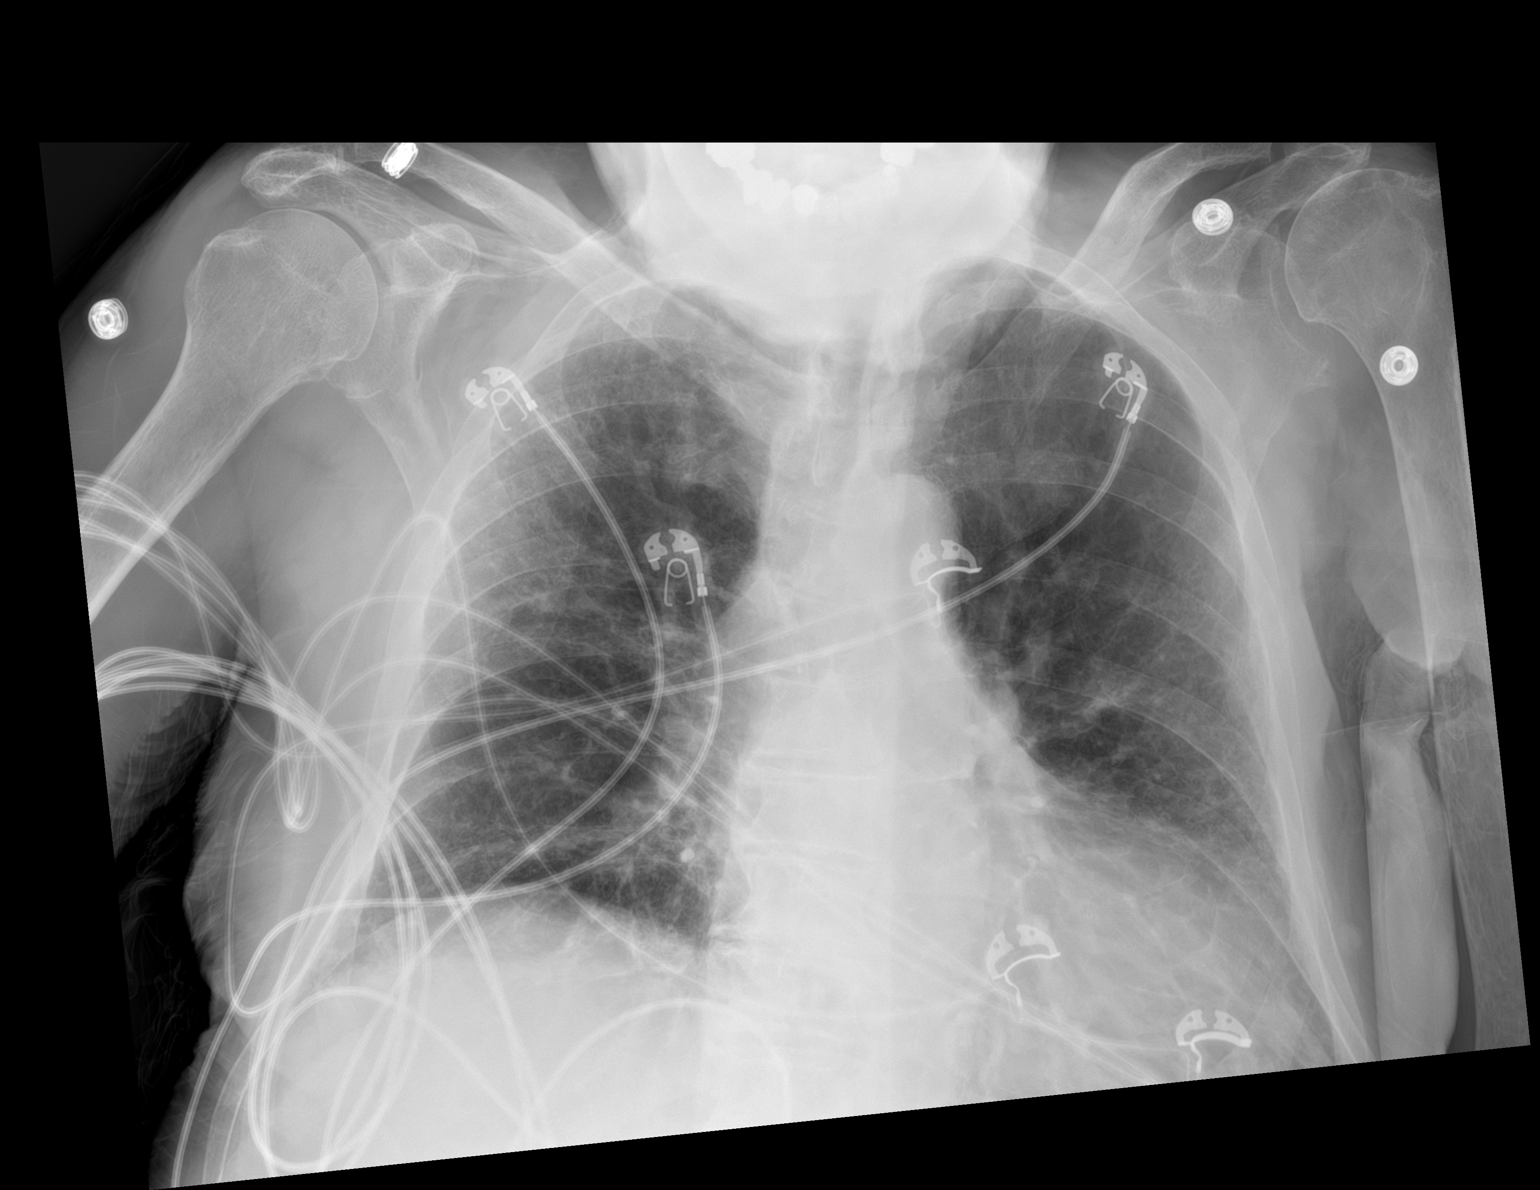

[1 of 1 positions shown; findings below may reference images not displayed]

FINDINGS: Chronic patchy reticulation in the bilateral lungs correlating with
chronic lung disease and mild fibrosis on 6661 chest CT. Generous
heart size accentuated by technique. Extensive artifact from EKG
leads.
IMPRESSION: Chronic lung disease/pulmonary fibrosis without definite progression
from 6661. No acute superimposed finding.

## 2023-01-17 IMAGING — CT CT CHEST-ABD-PELV W/ CM
2 of 5 series · 13 of 36 positions shown, 15 images · IV contrast (Omni 300)
Comparison: Chest CT 12/10/2019.

CLINICAL DATA: Unintended weight loss and idiopathic polycythemia
with pancytopenia. Evaluate for metastatic disease. * Tracking Code:
BO *

EXAM:
CT CHEST, ABDOMEN, AND PELVIS WITH CONTRAST
TECHNIQUE: Multidetector CT imaging of the chest, abdomen and pelvis was
performed following the standard protocol during bolus
administration of intravenous contrast.

[Series 4: cap with 5mm st · axial · 0.86mm/px · z∈[+782,+1342]mm · 10 of 138 slices shown, 12 images]
[im 13/138  mediastinal]
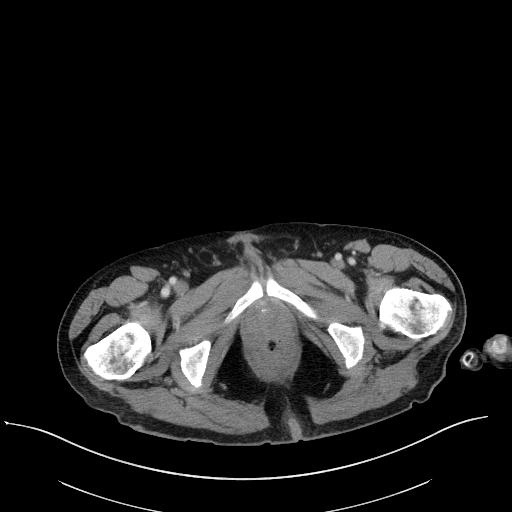
[im 13/138  bone]
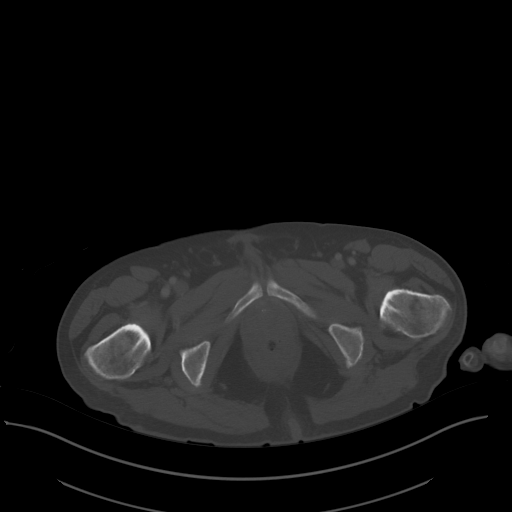
[im 25/138  mediastinal]
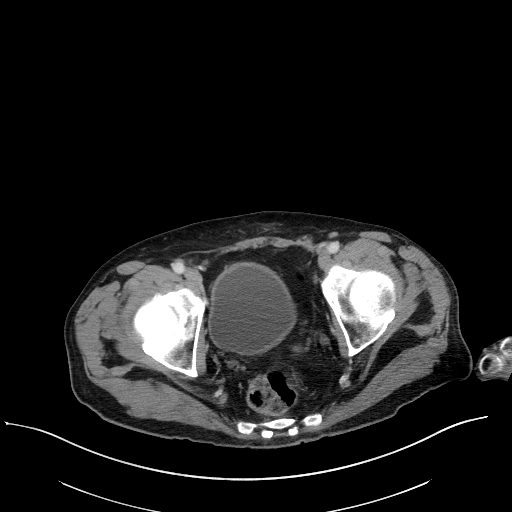
[im 38/138  mediastinal]
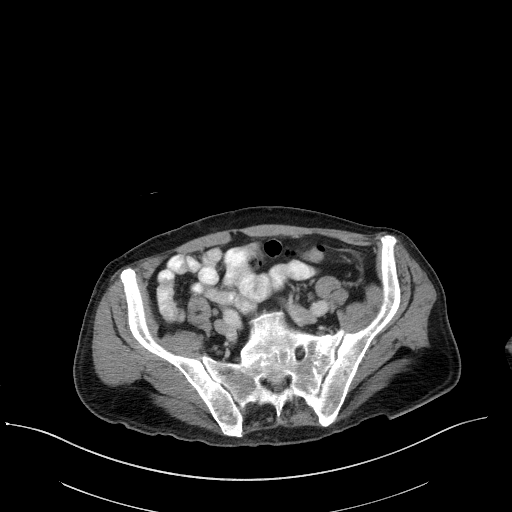
[im 50/138  mediastinal]
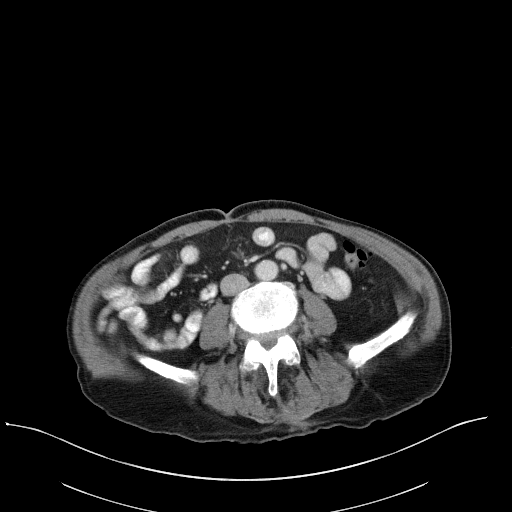
[im 63/138  mediastinal]
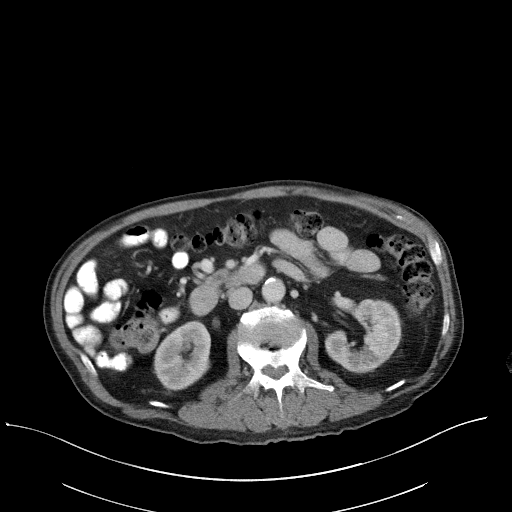
[im 75/138  mediastinal]
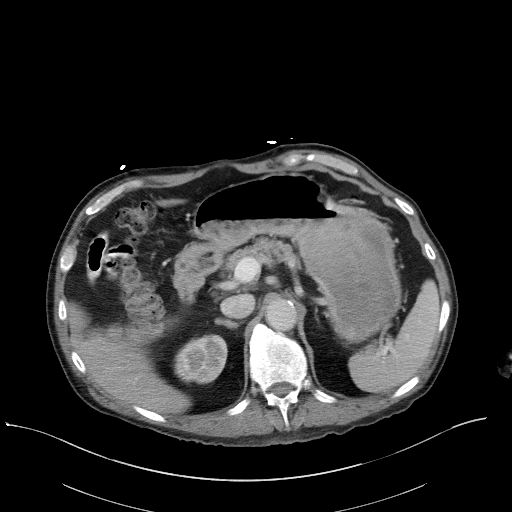
[im 88/138  mediastinal]
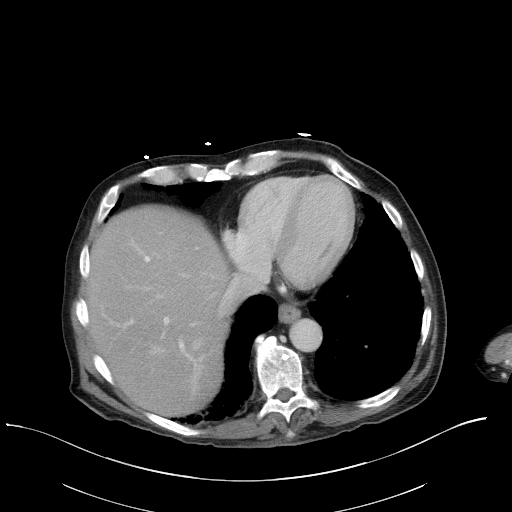
[im 100/138  mediastinal]
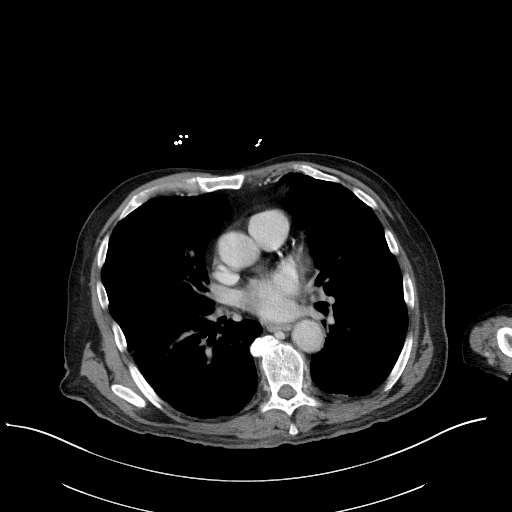
[im 113/138  mediastinal]
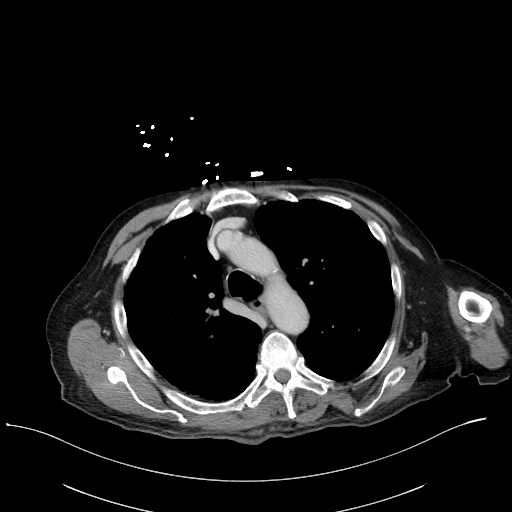
[im 113/138  bone]
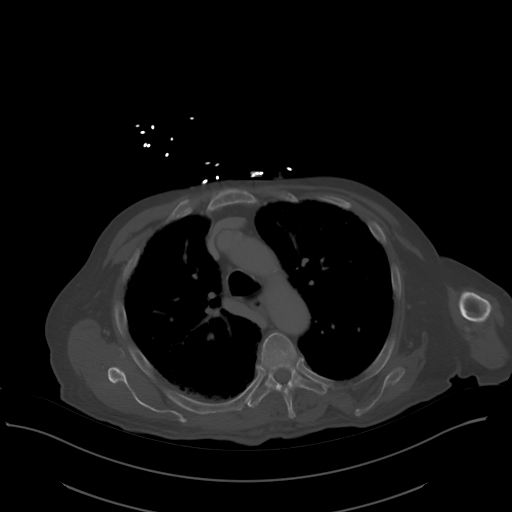
[im 125/138  mediastinal]
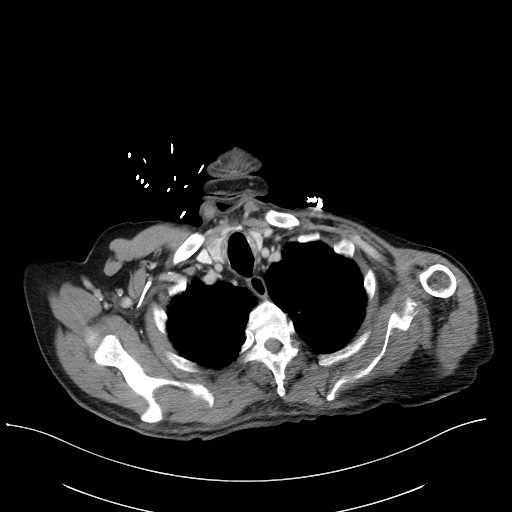

[Series 6: cap with 3mm st cor · coronal · 0.73mm/px · 3 of 150 slices shown]
[im 30/150  mediastinal]
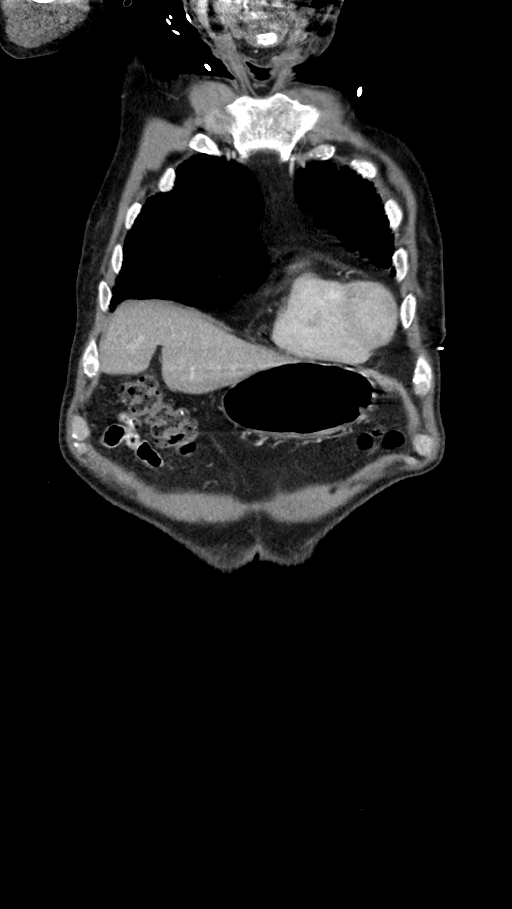
[im 60/150  mediastinal]
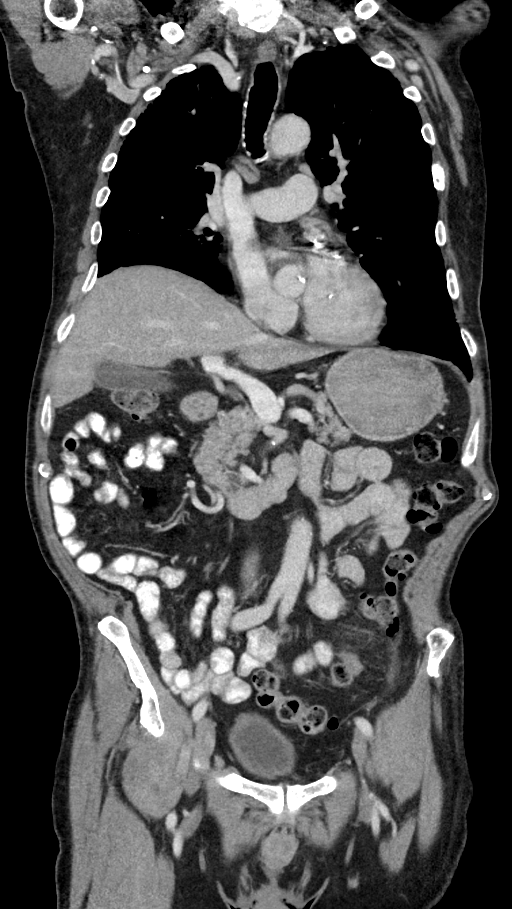
[im 90/150  mediastinal]
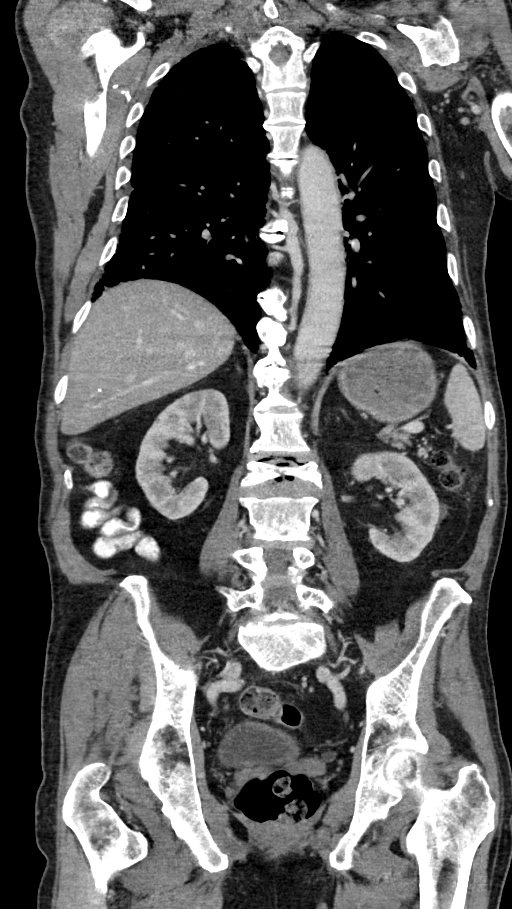

[13 of 36 positions shown; findings below may reference images not displayed]

RADIATION DOSE REDUCTION: This exam was performed according to the
departmental dose-optimization program which includes automated
exposure control, adjustment of the mA and/or kV according to
patient size and/or use of iterative reconstruction technique.

CONTRAST:  100mL OMNIPAQUE IOHEXOL 300 MG/ML  SOLN
FINDINGS: CT CHEST FINDINGS

Cardiovascular: The heart size is normal. No substantial pericardial
effusion. Coronary artery calcification is evident. Mild
atherosclerotic calcification is noted in the wall of the thoracic
aorta.

Mediastinum/Nodes: No mediastinal lymphadenopathy. There is no hilar
lymphadenopathy. The esophagus has normal imaging features. There is
no axillary lymphadenopathy.

Lungs/Pleura: Centrilobular and paraseptal emphysema evident. No
suspicious pulmonary nodule or mass. No focal airspace
consolidation. No pleural effusion.

Musculoskeletal: No worrisome lytic or sclerotic osseous
abnormality. Nonacute left-sided rib fractures evident.

CT ABDOMEN PELVIS FINDINGS

Hepatobiliary: No suspicious focal abnormality within the liver
parenchyma. There is no evidence for gallstones, gallbladder wall
thickening, or pericholecystic fluid. No intrahepatic or
extrahepatic biliary dilation.

Pancreas: No focal mass lesion. No dilatation of the main duct. No
intraparenchymal cyst. No peripancreatic edema.

Spleen: No splenomegaly. No focal mass lesion.

Adrenals/Urinary Tract: No adrenal nodule or mass. Right kidney
unremarkable. 2.3 cm water density lesion upper pole left kidney is
compatible with a simple cyst. No followup recommended. No evidence
for hydroureter. The urinary bladder appears normal for the degree
of distention.

Stomach/Bowel: Stomach is moderately distended with food and fluid.
Duodenum is normally positioned as is the ligament of Treitz. No
small bowel wall thickening. No small bowel dilatation. The terminal
ileum is normal. The appendix is not well visualized, but there is
no edema or inflammation in the region of the cecum. No gross
colonic mass. No colonic wall thickening. There is some subtle
edema/inflammation in the sigmoid mesocolon (axial image 100/series
4 and coronal image 61/6). Ill-defined diverticulum visible on image
97/4 and coronal 53/6.

Vascular/Lymphatic: There is mild atherosclerotic calcification of
the abdominal aorta without aneurysm. There is no gastrohepatic or
hepatoduodenal ligament lymphadenopathy. No retroperitoneal or
mesenteric lymphadenopathy. No pelvic sidewall lymphadenopathy.

Reproductive: Prostate gland is enlarged with evidence of median
lobe hypertrophy generating mass-effect on the inferior bladder

Other: No intraperitoneal free fluid.

Musculoskeletal: No worrisome lytic or sclerotic osseous
abnormality. Probable chronic insufficiency fracture left sacrum.
Chronic L2 compression deformity evident.
IMPRESSION: 1. No evidence for primary malignancy or metastatic disease in the
chest, abdomen, or pelvis.
2. Subtle edema/inflammation in the sigmoid mesocolon in the region
of an ill-defined diverticulum. Imaging features consistent with
sigmoid diverticulitis. No perforation or abscess.
3. Prostatomegaly with evidence of median lobe hypertrophy
generating mass-effect on the inferior bladder.
4. Nonacute left-sided rib fractures.
5. Chronic insufficiency fracture left sacrum with L2 compression
fracture.
6. Aortic Atherosclerosis (MRHNE-T18.8) and Emphysema (MRHNE-2NB.9).
